# Patient Record
Sex: Female | Born: 1940
Health system: Southern US, Community
[De-identification: ages and names within clinical notes are randomized; demographics above are authoritative.]

## PROBLEM LIST (undated history)

## (undated) DIAGNOSIS — R918 Other nonspecific abnormal finding of lung field: Secondary | ICD-10-CM

## (undated) DIAGNOSIS — I9789 Other postprocedural complications and disorders of the circulatory system, not elsewhere classified: Secondary | ICD-10-CM

## (undated) DIAGNOSIS — I301 Infective pericarditis: Secondary | ICD-10-CM

## (undated) DIAGNOSIS — I447 Left bundle-branch block, unspecified: Secondary | ICD-10-CM

## (undated) DIAGNOSIS — I10 Essential (primary) hypertension: Secondary | ICD-10-CM

## (undated) DIAGNOSIS — I4891 Unspecified atrial fibrillation: Secondary | ICD-10-CM

## (undated) DIAGNOSIS — I314 Cardiac tamponade: Secondary | ICD-10-CM

## (undated) DIAGNOSIS — R911 Solitary pulmonary nodule: Secondary | ICD-10-CM

## (undated) HISTORY — DX: Left bundle-branch block, unspecified: I44.7

## (undated) HISTORY — DX: Other nonspecific abnormal finding of lung field: R91.8

## (undated) HISTORY — DX: Unspecified atrial fibrillation: I97.89

## (undated) HISTORY — PX: OTHER SURGICAL HISTORY: SHX169

## (undated) HISTORY — DX: Solitary pulmonary nodule: R91.1

## (undated) HISTORY — DX: Unspecified atrial fibrillation: I48.91

## (undated) HISTORY — PX: LUNG REMOVAL, PARTIAL: SHX233

## (undated) HISTORY — DX: Essential (primary) hypertension: I10

---

## 1986-05-18 HISTORY — PX: ABDOMINAL HYSTERECTOMY: SHX81

## 2011-09-01 DIAGNOSIS — I459 Conduction disorder, unspecified: Secondary | ICD-10-CM | POA: Diagnosis not present

## 2011-09-01 DIAGNOSIS — R599 Enlarged lymph nodes, unspecified: Secondary | ICD-10-CM | POA: Diagnosis present

## 2011-09-01 DIAGNOSIS — R079 Chest pain, unspecified: Secondary | ICD-10-CM | POA: Diagnosis not present

## 2011-09-01 DIAGNOSIS — I309 Acute pericarditis, unspecified: Secondary | ICD-10-CM | POA: Diagnosis not present

## 2011-09-01 DIAGNOSIS — I2510 Atherosclerotic heart disease of native coronary artery without angina pectoris: Secondary | ICD-10-CM | POA: Diagnosis present

## 2011-09-01 DIAGNOSIS — I10 Essential (primary) hypertension: Secondary | ICD-10-CM | POA: Diagnosis present

## 2011-09-01 DIAGNOSIS — M279 Disease of jaws, unspecified: Secondary | ICD-10-CM | POA: Diagnosis not present

## 2011-09-01 DIAGNOSIS — I369 Nonrheumatic tricuspid valve disorder, unspecified: Secondary | ICD-10-CM | POA: Diagnosis not present

## 2011-09-01 DIAGNOSIS — I447 Left bundle-branch block, unspecified: Secondary | ICD-10-CM | POA: Diagnosis present

## 2011-09-01 DIAGNOSIS — J811 Chronic pulmonary edema: Secondary | ICD-10-CM | POA: Diagnosis not present

## 2011-09-01 DIAGNOSIS — I4891 Unspecified atrial fibrillation: Secondary | ICD-10-CM | POA: Diagnosis present

## 2011-09-01 DIAGNOSIS — R7611 Nonspecific reaction to tuberculin skin test without active tuberculosis: Secondary | ICD-10-CM | POA: Diagnosis present

## 2011-09-01 DIAGNOSIS — B9789 Other viral agents as the cause of diseases classified elsewhere: Secondary | ICD-10-CM | POA: Diagnosis present

## 2011-09-01 DIAGNOSIS — R0789 Other chest pain: Secondary | ICD-10-CM | POA: Diagnosis not present

## 2011-09-01 DIAGNOSIS — I318 Other specified diseases of pericardium: Secondary | ICD-10-CM | POA: Diagnosis not present

## 2011-09-01 DIAGNOSIS — I82619 Acute embolism and thrombosis of superficial veins of unspecified upper extremity: Secondary | ICD-10-CM | POA: Diagnosis not present

## 2011-09-01 DIAGNOSIS — I491 Atrial premature depolarization: Secondary | ICD-10-CM | POA: Diagnosis not present

## 2011-09-01 DIAGNOSIS — I314 Cardiac tamponade: Secondary | ICD-10-CM | POA: Diagnosis not present

## 2011-09-01 DIAGNOSIS — I808 Phlebitis and thrombophlebitis of other sites: Secondary | ICD-10-CM | POA: Diagnosis not present

## 2011-09-01 DIAGNOSIS — I517 Cardiomegaly: Secondary | ICD-10-CM | POA: Diagnosis not present

## 2011-09-01 DIAGNOSIS — Z452 Encounter for adjustment and management of vascular access device: Secondary | ICD-10-CM | POA: Diagnosis not present

## 2011-09-01 DIAGNOSIS — I498 Other specified cardiac arrhythmias: Secondary | ICD-10-CM | POA: Diagnosis not present

## 2011-09-01 DIAGNOSIS — J9819 Other pulmonary collapse: Secondary | ICD-10-CM | POA: Diagnosis not present

## 2011-09-01 DIAGNOSIS — J90 Pleural effusion, not elsewhere classified: Secondary | ICD-10-CM | POA: Diagnosis not present

## 2011-09-01 DIAGNOSIS — R0602 Shortness of breath: Secondary | ICD-10-CM | POA: Diagnosis not present

## 2011-09-01 DIAGNOSIS — D7289 Other specified disorders of white blood cells: Secondary | ICD-10-CM | POA: Diagnosis not present

## 2011-09-01 DIAGNOSIS — I319 Disease of pericardium, unspecified: Secondary | ICD-10-CM | POA: Diagnosis not present

## 2011-09-01 DIAGNOSIS — I509 Heart failure, unspecified: Secondary | ICD-10-CM | POA: Diagnosis not present

## 2011-09-01 DIAGNOSIS — C4490 Unspecified malignant neoplasm of skin, unspecified: Secondary | ICD-10-CM | POA: Diagnosis not present

## 2011-09-01 DIAGNOSIS — Z4682 Encounter for fitting and adjustment of non-vascular catheter: Secondary | ICD-10-CM | POA: Diagnosis not present

## 2011-09-01 DIAGNOSIS — R9389 Abnormal findings on diagnostic imaging of other specified body structures: Secondary | ICD-10-CM | POA: Diagnosis not present

## 2011-09-03 HISTORY — PX: OTHER SURGICAL HISTORY: SHX169

## 2011-09-14 DIAGNOSIS — J90 Pleural effusion, not elsewhere classified: Secondary | ICD-10-CM | POA: Diagnosis not present

## 2011-09-14 DIAGNOSIS — I311 Chronic constrictive pericarditis: Secondary | ICD-10-CM | POA: Diagnosis not present

## 2011-09-14 DIAGNOSIS — I319 Disease of pericardium, unspecified: Secondary | ICD-10-CM | POA: Diagnosis not present

## 2011-09-14 DIAGNOSIS — I4891 Unspecified atrial fibrillation: Secondary | ICD-10-CM | POA: Diagnosis not present

## 2011-09-15 ENCOUNTER — Emergency Department (HOSPITAL_BASED_OUTPATIENT_CLINIC_OR_DEPARTMENT_OTHER)
Admission: EM | Admit: 2011-09-15 | Discharge: 2011-09-15 | Disposition: A | Discharge status: Home / Self Care | Payer: Medicare Other | Attending: Emergency Medicine | Admitting: Emergency Medicine

## 2011-09-15 ENCOUNTER — Encounter (HOSPITAL_BASED_OUTPATIENT_CLINIC_OR_DEPARTMENT_OTHER): Payer: Self-pay | Admitting: *Deleted

## 2011-09-15 ENCOUNTER — Emergency Department (INDEPENDENT_AMBULATORY_CARE_PROVIDER_SITE_OTHER): Payer: Medicare Other

## 2011-09-15 DIAGNOSIS — R350 Frequency of micturition: Secondary | ICD-10-CM | POA: Diagnosis not present

## 2011-09-15 DIAGNOSIS — Z79899 Other long term (current) drug therapy: Secondary | ICD-10-CM | POA: Insufficient documentation

## 2011-09-15 DIAGNOSIS — R05 Cough: Secondary | ICD-10-CM | POA: Insufficient documentation

## 2011-09-15 DIAGNOSIS — R079 Chest pain, unspecified: Secondary | ICD-10-CM | POA: Diagnosis not present

## 2011-09-15 DIAGNOSIS — R918 Other nonspecific abnormal finding of lung field: Secondary | ICD-10-CM | POA: Diagnosis not present

## 2011-09-15 DIAGNOSIS — J90 Pleural effusion, not elsewhere classified: Secondary | ICD-10-CM | POA: Diagnosis not present

## 2011-09-15 DIAGNOSIS — K5900 Constipation, unspecified: Secondary | ICD-10-CM | POA: Insufficient documentation

## 2011-09-15 DIAGNOSIS — R059 Cough, unspecified: Secondary | ICD-10-CM | POA: Diagnosis not present

## 2011-09-15 DIAGNOSIS — Z0389 Encounter for observation for other suspected diseases and conditions ruled out: Secondary | ICD-10-CM | POA: Diagnosis not present

## 2011-09-15 DIAGNOSIS — M546 Pain in thoracic spine: Secondary | ICD-10-CM | POA: Insufficient documentation

## 2011-09-15 DIAGNOSIS — M549 Dorsalgia, unspecified: Secondary | ICD-10-CM

## 2011-09-15 HISTORY — DX: Cardiac tamponade: I31.4

## 2011-09-15 HISTORY — DX: Infective pericarditis: I30.1

## 2011-09-15 LAB — URINALYSIS, ROUTINE W REFLEX MICROSCOPIC
Bilirubin Urine: NEGATIVE
Glucose, UA: NEGATIVE mg/dL
Hgb urine dipstick: NEGATIVE
Ketones, ur: NEGATIVE mg/dL
Nitrite: NEGATIVE
Protein, ur: NEGATIVE mg/dL
Specific Gravity, Urine: 1.016 (ref 1.005–1.030)
Urobilinogen, UA: 0.2 mg/dL (ref 0.0–1.0)
pH: 6.5 (ref 5.0–8.0)

## 2011-09-15 LAB — URINE MICROSCOPIC-ADD ON

## 2011-09-15 NOTE — ED Notes (Signed)
MD at bedside, ultrasound performed.

## 2011-09-15 NOTE — Discharge Instructions (Signed)
RETURN FOR WORSENED PAIN, SHORTNESS OF BREATH, WEAKNESS, DIZZINESS, IF YOU PASS OUT OR CHEST PAIN OVER THE NEXT 12-24 HOURS PLEASE FOLLOWUP AS ALREADY ARRANGED, AND BE SURE TO HAVE REPEAT CAT SCAN OF YOUR CHEST AND REPEAT ECHOCARDIOGRAM OF YOUR HEART WITHIN ONE MONTH AS MENTIONED IN YOUR PAPERWORK FROM [facility]

## 2011-09-15 NOTE — ED Notes (Signed)
Pt also requesting something to assist her with moving her bowels. States she has been constipated for a few days and has also had frequent urination.

## 2011-09-15 NOTE — ED Notes (Signed)
Pt c/o bilateral lower lobe lung pain after going to bed tonight. Pt is recovering from heart surgery which was performed in [place] for cardiac tamponade. Pt denies any CP or SOB, only c/o lung pain. Pt states that she has been using her spirometer as directed.

## 2011-09-15 NOTE — ED Provider Notes (Signed)
History     CSN: [id number]  Arrival date & time 09/15/11  0424   First MD Initiated Contact with Patient 09/15/11 0424      Chief Complaint  Patient presents with  . Cough  . Constipation    Patient is a 71 y.o. female presenting with back pain. The history is provided by the patient.  Back Pain  This is a new problem. The current episode started 1 to 2 hours ago. The problem occurs constantly. The problem has been gradually worsening. The pain is associated with no known injury. Pain location: paraspinal - thoracic. Quality: aching. The pain does not radiate. The pain is mild. The symptoms are aggravated by twisting and bending. The pain is the same all the time. Pertinent negatives include no chest pain, no abdominal pain, no dysuria, no paresis and no weakness. She has tried bed rest for the symptoms. The treatment provided no relief.  Pt reports upper back pain that occurred while sleeping tonight.  She reports it is improved with movement and also using incentive spirometer No trauma No anterior chest pain No SOB reported No syncope reported No orthopnea reported No dyspnea on exertion No fever No new LE edema No focal leg weakness reported She does report urinary frequency but no dysuria  Pt with recent admission to [facility] hospital, recently discharged Reports she has f/u arranged here locally for followup care  Past Medical History  Diagnosis Date  . Cardiac tamponade due to viral pericarditis     Past Surgical History  Procedure Date  . Chest tube placement     History reviewed. No pertinent family history.  History  Substance Use Topics  . Smoking status: Former Games developer  . Smokeless tobacco: Not on file  . Alcohol Use:     OB History    Grav Para Term Preterm Abortions TAB SAB Ect Mult Living                  Review of Systems  Cardiovascular: Negative for chest pain.  Gastrointestinal: Negative for abdominal pain.  Genitourinary:  Negative for dysuria.  Musculoskeletal: Positive for back pain.  Neurological: Negative for weakness.  All other systems reviewed and are negative.    Allergies  Review of patient's allergies indicates no known allergies.  Home Medications   Current Outpatient Rx  Name Route Sig Dispense Refill  . AMIODARONE HCL 200 MG PO TABS Oral Take 200 mg by mouth daily.    . COLCHICINE 0.6 MG PO TABS Oral Take 0.6 mg by mouth daily.    Marland Kitchen METOPROLOL SUCCINATE ER 25 MG PO TB24 Oral Take 25 mg by mouth daily.    Marland Kitchen PANTOPRAZOLE SODIUM 40 MG PO TBEC Oral Take 40 mg by mouth daily.      BP 161/77  Pulse 73  Temp(Src) 98.3 F (36.8 C) (Oral)  Resp 22  Ht 5\' 5"  (1.651 m)  Wt 220 lb (99.791 kg)  BMI 36.61 kg/m2  SpO2 98%  Physical Exam CONSTITUTIONAL: Well developed/well nourished HEAD AND FACE: Normocephalic/atraumatic EYES: EOMI/PERRL ENMT: Mucous membranes moist NECK: supple no meningeal signs SPINE:entire spine nontender, thoracic paraspinal tenderness CV: S1/S2 noted, no murmurs/rubs/gallops noted LUNGS: Lungs are clear to auscultation bilaterally, no apparent distress Chest - well healed incision noted to lower chest without crepitance/drainage/erythema noted ABDOMEN: soft, nontender, no rebound or guarding GU:no cva tenderness NEURO: Pt is awake/alert, moves all extremitiesx4 EXTREMITIES: pulses normal, full ROM.  1+ pitting edema noted to bilateral tibial  surface (reports this is improved since leaving hospital) SKIN: warm, color normal PSYCH: no abnormalities of mood noted  ED Course  Procedures     EMERGENCY DEPARTMENT [place] CARDIAC EXAM "Study: Limited Ultrasound of the heart and pericardium"  INDICATIONS:back pain, with h/o pericardial effusion Multiple views of the heart and pericardium are obtained with a multi-frequency probe.  PERFORMED WU:[id number]  IMAGES ARCHIVED?: Yes  FINDINGS: No pericardial effusion  LIMITATIONS:  Body habitus  VIEWS USED: Parasternal long  axis  INTERPRETATION: Pericardial effusioin absent     Labs Reviewed  URINALYSIS, ROUTINE W REFLEX MICROSCOPIC   5:00 AM Patient with recent admission to [facility] for pericarditis with effusion/tamponade.  She was there from 4/16-4/25.  She underwent pericardial window with pericardial resection.  She was found to have afib and is on amiodarone.  As of time of discharge no known cause for effusion.  It was recommend to have repeat formal echo and CT chest 1 month after discharge.  EF on recent echo was 55%  Pt reports that "they told me to get checked if I have pain" She only reports pain to her "lung lobes" in her back She is in no distress I will check CXR and EKG. 6:30 AM CXR reviewed - I doubt pneumonia as no fever, no cough while in room, and lung sounds clear She denies syncope/dizziness/weakness/shortness of breath, and she had no anterior chest pain.  Did not endorse a pleuritic type back pain so I doubt acute PE.  She reports urinary frequency but u/a negative.  She reports that she has had this intermittently since the hospital discharge, but no dysuria/fever/abdominal pain.   I performed limited bedside cardiac ultrasound and no convincing signs of pericardial effusion noted She had a negative pulsus paradoxus  While in the ED I doubt recurrent pericardial effusion/tamponade She has f/u arranged for tomorrow We discussed strict return instructions Pt/family agreeable  The patient appears reasonably screened and/or stabilized for discharge and I doubt any other medical condition or other [facility] requiring further screening, evaluation, or treatment in the ED at this time prior to discharge.    MDM  Nursing notes reviewed and considered in documentation xrays reviewed and considered All labs/vitals reviewed and considered Previous records reviewed and considered (from [place])   Date: 09/15/2011  Rate: 70  Rhythm: normal sinus rhythm  QRS Axis:  left  Intervals: normal  ST/T Wave abnormalities: nonspecific T wave changes  Conduction Disutrbances:left bundle branch block  Narrative Interpretation: no low voltage noted, no alternans noted  Old EKG Reviewed: none available to review but records from [facility] reveal h/o left bundle branch block           Joya Gaskins, MD 09/15/11 [phone]

## 2011-09-15 NOTE — ED Notes (Signed)
Pt had fluid removed from around heart over a week ago in . Pt c/o of having pain in both her "lower lobes" and needs her bowels to move.

## 2011-09-16 DIAGNOSIS — I319 Disease of pericardium, unspecified: Secondary | ICD-10-CM | POA: Diagnosis not present

## 2011-09-16 DIAGNOSIS — I4891 Unspecified atrial fibrillation: Secondary | ICD-10-CM | POA: Diagnosis not present

## 2011-09-16 DIAGNOSIS — Z9889 Other specified postprocedural states: Secondary | ICD-10-CM | POA: Diagnosis not present

## 2011-09-17 DIAGNOSIS — I319 Disease of pericardium, unspecified: Secondary | ICD-10-CM | POA: Diagnosis not present

## 2011-09-17 DIAGNOSIS — I4891 Unspecified atrial fibrillation: Secondary | ICD-10-CM | POA: Diagnosis not present

## 2011-09-17 DIAGNOSIS — I311 Chronic constrictive pericarditis: Secondary | ICD-10-CM | POA: Diagnosis not present

## 2011-09-17 DIAGNOSIS — J90 Pleural effusion, not elsewhere classified: Secondary | ICD-10-CM | POA: Diagnosis not present

## 2011-09-21 ENCOUNTER — Other Ambulatory Visit: Payer: Self-pay | Admitting: Cardiology

## 2011-09-21 DIAGNOSIS — Z9889 Other specified postprocedural states: Secondary | ICD-10-CM | POA: Diagnosis not present

## 2011-09-21 DIAGNOSIS — I4891 Unspecified atrial fibrillation: Secondary | ICD-10-CM | POA: Diagnosis not present

## 2011-09-21 DIAGNOSIS — I319 Disease of pericardium, unspecified: Secondary | ICD-10-CM

## 2011-09-22 DIAGNOSIS — I319 Disease of pericardium, unspecified: Secondary | ICD-10-CM | POA: Diagnosis not present

## 2011-09-22 DIAGNOSIS — J90 Pleural effusion, not elsewhere classified: Secondary | ICD-10-CM | POA: Diagnosis not present

## 2011-09-22 DIAGNOSIS — I311 Chronic constrictive pericarditis: Secondary | ICD-10-CM | POA: Diagnosis not present

## 2011-09-22 DIAGNOSIS — I4891 Unspecified atrial fibrillation: Secondary | ICD-10-CM | POA: Diagnosis not present

## 2011-09-24 DIAGNOSIS — I319 Disease of pericardium, unspecified: Secondary | ICD-10-CM | POA: Diagnosis not present

## 2011-09-24 DIAGNOSIS — I311 Chronic constrictive pericarditis: Secondary | ICD-10-CM | POA: Diagnosis not present

## 2011-09-24 DIAGNOSIS — J90 Pleural effusion, not elsewhere classified: Secondary | ICD-10-CM | POA: Diagnosis not present

## 2011-09-24 DIAGNOSIS — I4891 Unspecified atrial fibrillation: Secondary | ICD-10-CM | POA: Diagnosis not present

## 2011-09-29 ENCOUNTER — Encounter: Payer: Medicare Other | Admitting: Thoracic Surgery (Cardiothoracic Vascular Surgery)

## 2011-09-29 DIAGNOSIS — I319 Disease of pericardium, unspecified: Secondary | ICD-10-CM | POA: Diagnosis not present

## 2011-09-29 DIAGNOSIS — I311 Chronic constrictive pericarditis: Secondary | ICD-10-CM | POA: Diagnosis not present

## 2011-09-29 DIAGNOSIS — I4891 Unspecified atrial fibrillation: Secondary | ICD-10-CM | POA: Diagnosis not present

## 2011-09-29 DIAGNOSIS — J90 Pleural effusion, not elsewhere classified: Secondary | ICD-10-CM | POA: Diagnosis not present

## 2011-09-30 DIAGNOSIS — I4891 Unspecified atrial fibrillation: Secondary | ICD-10-CM | POA: Diagnosis not present

## 2011-09-30 DIAGNOSIS — I319 Disease of pericardium, unspecified: Secondary | ICD-10-CM | POA: Diagnosis not present

## 2011-09-30 DIAGNOSIS — Z9889 Other specified postprocedural states: Secondary | ICD-10-CM | POA: Diagnosis not present

## 2011-10-01 DIAGNOSIS — I311 Chronic constrictive pericarditis: Secondary | ICD-10-CM | POA: Diagnosis not present

## 2011-10-01 DIAGNOSIS — I319 Disease of pericardium, unspecified: Secondary | ICD-10-CM | POA: Diagnosis not present

## 2011-10-01 DIAGNOSIS — I4891 Unspecified atrial fibrillation: Secondary | ICD-10-CM | POA: Diagnosis not present

## 2011-10-01 DIAGNOSIS — J90 Pleural effusion, not elsewhere classified: Secondary | ICD-10-CM | POA: Diagnosis not present

## 2011-10-03 DIAGNOSIS — J90 Pleural effusion, not elsewhere classified: Secondary | ICD-10-CM | POA: Diagnosis not present

## 2011-10-03 DIAGNOSIS — I4891 Unspecified atrial fibrillation: Secondary | ICD-10-CM | POA: Diagnosis not present

## 2011-10-03 DIAGNOSIS — I319 Disease of pericardium, unspecified: Secondary | ICD-10-CM | POA: Diagnosis not present

## 2011-10-03 DIAGNOSIS — I311 Chronic constrictive pericarditis: Secondary | ICD-10-CM | POA: Diagnosis not present

## 2011-10-06 ENCOUNTER — Encounter: Payer: Self-pay | Admitting: Thoracic Surgery (Cardiothoracic Vascular Surgery)

## 2011-10-06 ENCOUNTER — Institutional Professional Consult (permissible substitution) (INDEPENDENT_AMBULATORY_CARE_PROVIDER_SITE_OTHER): Payer: Medicare Other | Admitting: Thoracic Surgery (Cardiothoracic Vascular Surgery)

## 2011-10-06 VITALS — BP 152/88 | HR 72 | Resp 18 | Ht 65.0 in | Wt 218.0 lb

## 2011-10-06 DIAGNOSIS — I311 Chronic constrictive pericarditis: Secondary | ICD-10-CM | POA: Diagnosis not present

## 2011-10-06 DIAGNOSIS — I319 Disease of pericardium, unspecified: Secondary | ICD-10-CM

## 2011-10-06 DIAGNOSIS — J90 Pleural effusion, not elsewhere classified: Secondary | ICD-10-CM | POA: Diagnosis not present

## 2011-10-06 DIAGNOSIS — I313 Pericardial effusion (noninflammatory): Secondary | ICD-10-CM | POA: Insufficient documentation

## 2011-10-06 DIAGNOSIS — I4891 Unspecified atrial fibrillation: Secondary | ICD-10-CM | POA: Diagnosis not present

## 2011-10-06 DIAGNOSIS — I3139 Other pericardial effusion (noninflammatory): Secondary | ICD-10-CM

## 2011-10-06 NOTE — Progress Notes (Signed)
PCP is Beverley Fiedler, MD, MD Referring Provider is Quintella Reichert, MD  Chief Complaint  Patient presents with  . Pericardial Effusion    Referral from Dr Mayford Knife for surgical eval on pericardial effusion, ECHO 09/30/11    HPI: 71 year old woman from [place] who had a subxiphoid pericardial window done at [facility] on April 10. She was discharged from the without a clear understanding of when she could resume activities. She was told she could stop her Motrin on May 10, but then thought she should continue to take it and has been on regular basis. She was told not to lift anything heavy or raise her arms, but was not given the day when she can begin to do those things again.  Past Medical History  Diagnosis Date  . Cardiac tamponade due to viral pericarditis   . A-fib     Past Surgical History  Procedure Date  . Chest tube placement 09/03/11  . Subxiphoid pericardial window and drainage anterior partial pericardiectomy 09/03/11  . Right knee arthoscopy with meniscal repair     Family History  Problem Relation Age of Onset  . Hypertension Mother   . Ovarian cancer Mother   . Hypertension Father   . Stroke Father   . Prostate cancer Brother   . Thyroid disease Sister   . Hypertension Sister     Social History History  Substance Use Topics  . Smoking status: Former Smoker    Types: Cigarettes    Quit date: 05/18/1978  . Smokeless tobacco: Never Used  . Alcohol Use: No    Current Outpatient Prescriptions  Medication Sig Dispense Refill  . amiodarone (PACERONE) 200 MG tablet Take 200 mg by mouth 2 (two) times daily.       . colchicine 0.6 MG tablet Take 0.6 mg by mouth 2 (two) times daily.       . metoprolol succinate (TOPROL-XL) 25 MG 24 hr tablet Take 25 mg by mouth 2 (two) times daily.       . pantoprazole (PROTONIX) 40 MG tablet Take 40 mg by mouth daily.        Allergies  Allergen Reactions  . Tape Hives    Review of Systems  Constitutional: Positive  for fatigue. Negative for fever and chills.  Respiratory: Positive for chest tightness and shortness of breath (with exertion).   Cardiovascular: Positive for chest pain and palpitations.       Heart murmur Atrial fibrillation  All other systems reviewed and are negative.    BP 152/88  Pulse 72  Resp 18  Ht 5\' 5"  (1.651 m)  Wt 218 lb (98.884 kg)  BMI 36.28 kg/m2  SpO2 98% Physical Exam  Constitutional: She is oriented to person, place, and time. She appears well-developed and well-nourished. No distress.  HENT:  Head: Normocephalic and atraumatic.  Cardiovascular: Normal rate and regular rhythm.   Murmur heard. Pulmonary/Chest: Effort normal and breath sounds normal. No respiratory distress. She has no wheezes. She has no rales.  Abdominal:       Incision clean, dry and intact  Musculoskeletal: She exhibits no edema.  Neurological: She is alert and oriented to person, place, and time.  Skin: Skin is warm and dry.     Diagnostic Tests: Echocardiogram 09/30/2011-  report states normal LV size and function, LVEF 55-60%, trivial posterior pericardial effusion.  CXR 09/15/11 CHEST - 2 VIEW  Comparison: None.  Findings: Heart size upper normal to mildly enlarged. The  pericardial effusion not excluded. Small  pleural effusions with  associated opacities; atelectasis versus infiltrate. No acute  osseous abnormality. No pneumothorax identified.  IMPRESSION:  Prominent cardiac contour. Pericardial effusion not excluded.  Bibasilar opacities and associated small pleural effusions;  atelectasis versus infiltrate.  Impression: 71 year old woman with recent subxiphoid pericardial window for probable viral pericarditis. She was treated with anti-inflammatory medications. Repeat echo shows a trivial effusion. Chest x-ray shows very small bilateral pleural effusions.  Her wound is healing well. I recommend that she not lift any objects greater than 10 pounds until it has been 6 weeks  from her surgery. Likewise she should avoid extending our raising her arms for the same period of time. At this point she is safe to ride in the front seat.  I told her she can use Motrin as needed for pain but does not need to take down a regular basis. She should  complete her prescribed course of colchicine.  I gave her a card with our numbers so that she can contact our office with any issues.  Plan: I will be happy to see her back if I can be of any further assistance with her care

## 2011-10-07 DIAGNOSIS — J90 Pleural effusion, not elsewhere classified: Secondary | ICD-10-CM | POA: Diagnosis not present

## 2011-10-07 DIAGNOSIS — I4891 Unspecified atrial fibrillation: Secondary | ICD-10-CM | POA: Diagnosis not present

## 2011-10-07 DIAGNOSIS — I319 Disease of pericardium, unspecified: Secondary | ICD-10-CM | POA: Diagnosis not present

## 2011-10-07 DIAGNOSIS — I311 Chronic constrictive pericarditis: Secondary | ICD-10-CM | POA: Diagnosis not present

## 2011-10-09 DIAGNOSIS — I4891 Unspecified atrial fibrillation: Secondary | ICD-10-CM | POA: Diagnosis not present

## 2011-10-09 DIAGNOSIS — J90 Pleural effusion, not elsewhere classified: Secondary | ICD-10-CM | POA: Diagnosis not present

## 2011-10-09 DIAGNOSIS — I319 Disease of pericardium, unspecified: Secondary | ICD-10-CM | POA: Diagnosis not present

## 2011-10-09 DIAGNOSIS — I311 Chronic constrictive pericarditis: Secondary | ICD-10-CM | POA: Diagnosis not present

## 2011-10-12 DIAGNOSIS — J90 Pleural effusion, not elsewhere classified: Secondary | ICD-10-CM | POA: Diagnosis not present

## 2011-10-12 DIAGNOSIS — I319 Disease of pericardium, unspecified: Secondary | ICD-10-CM | POA: Diagnosis not present

## 2011-10-12 DIAGNOSIS — I4891 Unspecified atrial fibrillation: Secondary | ICD-10-CM | POA: Diagnosis not present

## 2011-10-12 DIAGNOSIS — I311 Chronic constrictive pericarditis: Secondary | ICD-10-CM | POA: Diagnosis not present

## 2011-10-15 DIAGNOSIS — I4891 Unspecified atrial fibrillation: Secondary | ICD-10-CM | POA: Diagnosis not present

## 2011-10-15 DIAGNOSIS — I311 Chronic constrictive pericarditis: Secondary | ICD-10-CM | POA: Diagnosis not present

## 2011-10-15 DIAGNOSIS — J90 Pleural effusion, not elsewhere classified: Secondary | ICD-10-CM | POA: Diagnosis not present

## 2011-10-15 DIAGNOSIS — I319 Disease of pericardium, unspecified: Secondary | ICD-10-CM | POA: Diagnosis not present

## 2011-10-16 DIAGNOSIS — J90 Pleural effusion, not elsewhere classified: Secondary | ICD-10-CM | POA: Diagnosis not present

## 2011-10-16 DIAGNOSIS — I4891 Unspecified atrial fibrillation: Secondary | ICD-10-CM | POA: Diagnosis not present

## 2011-10-16 DIAGNOSIS — I319 Disease of pericardium, unspecified: Secondary | ICD-10-CM | POA: Diagnosis not present

## 2011-10-16 DIAGNOSIS — I311 Chronic constrictive pericarditis: Secondary | ICD-10-CM | POA: Diagnosis not present

## 2011-11-04 ENCOUNTER — Ambulatory Visit
Admission: RE | Admit: 2011-11-04 | Discharge: 2011-11-04 | Disposition: A | Discharge status: Home / Self Care | Payer: Medicare Other | Source: Ambulatory Visit | Attending: Cardiology | Admitting: Cardiology

## 2011-11-04 DIAGNOSIS — R918 Other nonspecific abnormal finding of lung field: Secondary | ICD-10-CM | POA: Diagnosis not present

## 2011-11-04 DIAGNOSIS — I319 Disease of pericardium, unspecified: Secondary | ICD-10-CM

## 2011-11-04 MED ORDER — IOHEXOL 300 MG/ML  SOLN
75.0000 mL | Freq: Once | INTRAMUSCULAR | Status: AC | PRN
  Administered 2011-11-04: 75 mL via INTRAVENOUS

## 2011-11-13 ENCOUNTER — Emergency Department (HOSPITAL_BASED_OUTPATIENT_CLINIC_OR_DEPARTMENT_OTHER)
Admission: EM | Admit: 2011-11-13 | Discharge: 2011-11-13 | Disposition: A | Discharge status: Home / Self Care | Payer: Medicare Other | Attending: Emergency Medicine | Admitting: Emergency Medicine

## 2011-11-13 ENCOUNTER — Encounter (HOSPITAL_BASED_OUTPATIENT_CLINIC_OR_DEPARTMENT_OTHER): Payer: Self-pay

## 2011-11-13 DIAGNOSIS — R209 Unspecified disturbances of skin sensation: Secondary | ICD-10-CM | POA: Insufficient documentation

## 2011-11-13 DIAGNOSIS — Z87891 Personal history of nicotine dependence: Secondary | ICD-10-CM | POA: Diagnosis not present

## 2011-11-13 DIAGNOSIS — I4891 Unspecified atrial fibrillation: Secondary | ICD-10-CM | POA: Insufficient documentation

## 2011-11-13 DIAGNOSIS — R202 Paresthesia of skin: Secondary | ICD-10-CM

## 2011-11-13 DIAGNOSIS — M79609 Pain in unspecified limb: Secondary | ICD-10-CM | POA: Diagnosis not present

## 2011-11-13 NOTE — ED Provider Notes (Signed)
History     CSN: [id number]  Arrival date & time 11/13/11  2218   First MD Initiated Contact with Patient 11/13/11 2236      Chief Complaint  Patient presents with  . Numbness    (Consider location/radiation/quality/duration/timing/severity/associated sxs/prior treatment) HPI This 71 year old black female who had the sudden onset of a sharp pain in her right forearm about an hour ago. The pain was located along the ulnar side of the forearm. The pain only lasted a couple of minutes and this was followed by a sensation of numbness in the entire right forearm and hand. Although she was experiencing this subjective numbness she still had normal sensation in that forearm when she touched it. She states the forearm also appeared to be darker colored than normal. The symptoms have nearly resolved now without specific treatment other than flexing and extending her fingers. There was no associated motor deficit. There were no paresthesias of the face or other parts of the body. She has had no recent injury to her arm.   Past Medical History  Diagnosis Date  . Cardiac tamponade due to viral pericarditis   . A-fib     Past Surgical History  Procedure Date  . Chest tube placement 09/03/11  . Subxiphoid pericardial window and drainage anterior partial pericardiectomy 09/03/11  . Right knee arthoscopy with meniscal repair   . Abdominal hysterectomy     Family History  Problem Relation Age of Onset  . Hypertension Mother   . Ovarian cancer Mother   . Hypertension Father   . Stroke Father   . Prostate cancer Brother   . Thyroid disease Sister   . Hypertension Sister     History  Substance Use Topics  . Smoking status: Former Smoker    Types: Cigarettes    Quit date: 05/18/1978  . Smokeless tobacco: Never Used  . Alcohol Use: No    OB History    Grav Para Term Preterm Abortions TAB SAB Ect Mult Living                  Review of Systems  All other systems reviewed and are  negative.    Allergies  Tape  Home Medications   Current Outpatient Rx  Name Route Sig Dispense Refill  . AMIODARONE HCL 200 MG PO TABS Oral Take 200 mg by mouth 2 (two) times daily.     . COLCHICINE 0.6 MG PO TABS Oral Take 0.6 mg by mouth 2 (two) times daily.     Marland Kitchen METOPROLOL SUCCINATE ER 25 MG PO TB24 Oral Take 25 mg by mouth 2 (two) times daily.     Marland Kitchen PANTOPRAZOLE SODIUM 40 MG PO TBEC Oral Take 40 mg by mouth daily.      BP 158/50  Pulse 65  Temp 97.6 F (36.4 C) (Oral)  Resp 16  Ht 5\' 6"  (1.676 m)  Wt 210 lb (95.255 kg)  BMI 33.89 kg/m2  SpO2 99%  Physical Exam General: Well-developed, well-nourished female in no acute distress; appearance consistent with age of record HENT: normocephalic, atraumatic Eyes: pupils equal round and reactive to light; extraocular muscles intact; arcus senilis bilaterally Neck: supple; no carotid bruit Heart: regular rate and rhythm Lungs: clear to auscultation bilaterally Abdomen: soft; nondistended Extremities: No deformity; full range of motion; pulses normal; normal capillary refill of fingers bilaterally; normal color of forearms and hands bilaterally Neurologic: Awake, alert and oriented; motor function intact in all extremities and symmetric; no facial droop; normal sensation  of forearms and hands bilaterally Skin: Warm and dry Psychiatric: Normal mood and affect    ED Course  Procedures (including critical care time)     MDM  EKG Interpretation:  Date & Time: 11/13/2011 11:07 PM  Rate: 65  Rhythm: normal sinus rhythm  QRS Axis: normal  Intervals: PR prolonged  ST/T Wave abnormalities: normal  Conduction Disutrbances:first-degree A-V block  and left bundle branch block  Narrative Interpretation: left atrial enlargement  Old EKG Reviewed: unchanged  The patient's symptoms are not consistent with a TIA as a TIA would not be expected to cause pain. Also the patient's sensation remained intact despite her subjective  paresthesias. The patient did have PICC line and cellulitis in her right arm in April of this year. She's concerned about possible blood clot. We will have her return for a Doppler ultrasound tomorrow morning.         Hanley Seamen, MD 11/13/11 [phone]

## 2011-11-13 NOTE — ED Notes (Signed)
C/o numbness to right arm from elbow to hand x 30-40 min-reports "color is darker that the other"-pain earlier to right forearm-denies at present-denies injury

## 2011-11-14 ENCOUNTER — Emergency Department (HOSPITAL_BASED_OUTPATIENT_CLINIC_OR_DEPARTMENT_OTHER): Payer: Medicare Other

## 2011-11-14 ENCOUNTER — Ambulatory Visit (HOSPITAL_BASED_OUTPATIENT_CLINIC_OR_DEPARTMENT_OTHER)
Admission: RE | Admit: 2011-11-14 | Discharge: 2011-11-14 | Disposition: A | Discharge status: Home / Self Care | Payer: Medicare Other | Source: Ambulatory Visit | Attending: Emergency Medicine | Admitting: Emergency Medicine

## 2011-11-14 DIAGNOSIS — M799 Soft tissue disorder, unspecified: Secondary | ICD-10-CM | POA: Insufficient documentation

## 2011-11-14 DIAGNOSIS — M79609 Pain in unspecified limb: Secondary | ICD-10-CM | POA: Insufficient documentation

## 2011-11-14 DIAGNOSIS — I319 Disease of pericardium, unspecified: Secondary | ICD-10-CM | POA: Diagnosis not present

## 2011-11-14 DIAGNOSIS — R209 Unspecified disturbances of skin sensation: Secondary | ICD-10-CM | POA: Diagnosis not present

## 2011-11-14 NOTE — ED Provider Notes (Signed)
Ultrasound report of rue reviewed and negative for clot.  Patient called and informed of results.   Hilario Quarry, MD 11/14/11 2140

## 2011-11-15 ENCOUNTER — Inpatient Hospital Stay (HOSPITAL_BASED_OUTPATIENT_CLINIC_OR_DEPARTMENT_OTHER): Admission: RE | Admit: 2011-11-15 | Payer: Medicare Other | Source: Ambulatory Visit

## 2011-11-26 DIAGNOSIS — I319 Disease of pericardium, unspecified: Secondary | ICD-10-CM | POA: Diagnosis not present

## 2011-11-26 DIAGNOSIS — I4891 Unspecified atrial fibrillation: Secondary | ICD-10-CM | POA: Diagnosis not present

## 2011-12-02 DIAGNOSIS — I319 Disease of pericardium, unspecified: Secondary | ICD-10-CM | POA: Diagnosis not present

## 2011-12-02 DIAGNOSIS — I4891 Unspecified atrial fibrillation: Secondary | ICD-10-CM | POA: Diagnosis not present

## 2012-03-03 ENCOUNTER — Encounter (HOSPITAL_BASED_OUTPATIENT_CLINIC_OR_DEPARTMENT_OTHER): Payer: Self-pay

## 2012-03-03 ENCOUNTER — Emergency Department (HOSPITAL_BASED_OUTPATIENT_CLINIC_OR_DEPARTMENT_OTHER)
Admission: EM | Admit: 2012-03-03 | Discharge: 2012-03-03 | Disposition: A | Discharge status: Home / Self Care | Payer: Medicare Other | Attending: Emergency Medicine | Admitting: Emergency Medicine

## 2012-03-03 ENCOUNTER — Emergency Department (HOSPITAL_BASED_OUTPATIENT_CLINIC_OR_DEPARTMENT_OTHER): Payer: Medicare Other

## 2012-03-03 DIAGNOSIS — R0602 Shortness of breath: Secondary | ICD-10-CM | POA: Diagnosis not present

## 2012-03-03 DIAGNOSIS — J984 Other disorders of lung: Secondary | ICD-10-CM | POA: Diagnosis not present

## 2012-03-03 DIAGNOSIS — I319 Disease of pericardium, unspecified: Secondary | ICD-10-CM | POA: Diagnosis not present

## 2012-03-03 DIAGNOSIS — R911 Solitary pulmonary nodule: Secondary | ICD-10-CM | POA: Diagnosis not present

## 2012-03-03 DIAGNOSIS — I4891 Unspecified atrial fibrillation: Secondary | ICD-10-CM | POA: Diagnosis not present

## 2012-03-03 LAB — COMPREHENSIVE METABOLIC PANEL
ALT: 9 U/L (ref 0–35)
AST: 16 U/L (ref 0–37)
Albumin: 3.7 g/dL (ref 3.5–5.2)
Alkaline Phosphatase: 99 U/L (ref 39–117)
BUN: 20 mg/dL (ref 6–23)
CO2: 29 mEq/L (ref 19–32)
Calcium: 9.7 mg/dL (ref 8.4–10.5)
Chloride: 97 mEq/L (ref 96–112)
Creatinine, Ser: 1.1 mg/dL (ref 0.50–1.10)
GFR calc Af Amer: 57 mL/min — ABNORMAL LOW (ref >90)
GFR calc non Af Amer: 49 mL/min — ABNORMAL LOW (ref >90)
Glucose, Bld: 127 mg/dL — ABNORMAL HIGH (ref 70–99)
Potassium: 3.7 mEq/L (ref 3.5–5.1)
Sodium: 137 mEq/L (ref 135–145)
Total Bilirubin: 0.3 mg/dL (ref 0.3–1.2)
Total Protein: 7.9 g/dL (ref 6.0–8.3)

## 2012-03-03 LAB — CBC WITH DIFFERENTIAL/PLATELET
Basophils Absolute: 0.1 10*3/uL (ref 0.0–0.1)
Basophils Relative: 1 % (ref 0–1)
Eosinophils Absolute: 0.3 10*3/uL (ref 0.0–0.7)
Eosinophils Relative: 3 % (ref 0–5)
HCT: 42.6 % (ref 36.0–46.0)
Hemoglobin: 14.6 g/dL (ref 12.0–15.0)
Lymphocytes Relative: 27 % (ref 12–46)
Lymphs Abs: 3 10*3/uL (ref 0.7–4.0)
MCH: 28.6 pg (ref 26.0–34.0)
MCHC: 34.3 g/dL (ref 30.0–36.0)
MCV: 83.4 fL (ref 78.0–100.0)
Monocytes Absolute: 1 10*3/uL (ref 0.1–1.0)
Monocytes Relative: 9 % (ref 3–12)
Neutro Abs: 6.9 10*3/uL (ref 1.7–7.7)
Neutrophils Relative %: 62 % (ref 43–77)
Platelets: 177 10*3/uL (ref 150–400)
RBC: 5.11 MIL/uL (ref 3.87–5.11)
RDW: 14.7 % (ref 11.5–15.5)
WBC: 11.3 10*3/uL — ABNORMAL HIGH (ref 4.0–10.5)

## 2012-03-03 LAB — D-DIMER, QUANTITATIVE: D-Dimer, Quant: 0.97 ug/mL-FEU — ABNORMAL HIGH (ref 0.00–0.48)

## 2012-03-03 MED ORDER — IOHEXOL 350 MG/ML SOLN
100.0000 mL | Freq: Once | INTRAVENOUS | Status: AC | PRN
  Administered 2012-03-03: 100 mL via INTRAVENOUS

## 2012-03-03 NOTE — ED Notes (Signed)
Received call from Dr. Mayford Knife. Pt being sent to ED for CT r/o PE. Pt has not had kidney function tested since June. Call Dr. Mayford Knife at [phone] with results.

## 2012-03-03 NOTE — ED Provider Notes (Signed)
History     CSN: [id number]  Arrival date & time 03/03/12  4098   First MD Initiated Contact with Patient 03/03/12 2057      Chief Complaint  Patient presents with  . Abnormal Lab    (Consider location/radiation/quality/duration/timing/severity/associated sxs/prior treatment) HPI Comments: Pt was sent over from Dr Lubertha Basque office with [facility] cardiology for shortness of breath and an elevated d-dimer.  She has been having some increased SOB over the last couple of days.  No CP, no cough or chest congestion.  No increase in pedal edema.  No orthopnea.  Has a hx of cardiac tamponade in the past.  Went to see Dr Mayford Knife today who did an ECHO, which per pt did not show any increased pericardial fluid.  Had labs showing in increased d-dimer and was sent over here for evaluation.   Past Medical History  Diagnosis Date  . Cardiac tamponade due to viral pericarditis   . A-fib     Past Surgical History  Procedure Date  . Chest tube placement 09/03/11  . Subxiphoid pericardial window and drainage anterior partial pericardiectomy 09/03/11  . Right knee arthoscopy with meniscal repair   . Abdominal hysterectomy     Family History  Problem Relation Age of Onset  . Hypertension Mother   . Ovarian cancer Mother   . Hypertension Father   . Stroke Father   . Prostate cancer Brother   . Thyroid disease Sister   . Hypertension Sister     History  Substance Use Topics  . Smoking status: Former Smoker    Types: Cigarettes    Quit date: 05/18/1978  . Smokeless tobacco: Never Used  . Alcohol Use: No    OB History    Grav Para Term Preterm Abortions TAB SAB Ect Mult Living                  Review of Systems  Constitutional: Negative for fever, chills, diaphoresis and fatigue.  HENT: Negative for congestion, rhinorrhea and sneezing.   Eyes: Negative.   Respiratory: Positive for shortness of breath. Negative for cough and chest tightness.   Cardiovascular: Negative for chest pain and  leg swelling.  Gastrointestinal: Negative for nausea, vomiting, abdominal pain, diarrhea and blood in stool.  Genitourinary: Negative for frequency, hematuria, flank pain and difficulty urinating.  Musculoskeletal: Negative for back pain and arthralgias.  Skin: Negative for rash.  Neurological: Negative for dizziness, speech difficulty, weakness, numbness and headaches.    Allergies  Tape  Home Medications   Current Outpatient Rx  Name Route Sig Dispense Refill  . AMIODARONE HCL 200 MG PO TABS Oral Take 200 mg by mouth 2 (two) times daily.     . COLCHICINE 0.6 MG PO TABS Oral Take 0.6 mg by mouth 2 (two) times daily.     Marland Kitchen METOPROLOL SUCCINATE ER 25 MG PO TB24 Oral Take 25 mg by mouth 2 (two) times daily. Patient took this medication at 6 pm tonight.      BP 139/79  Pulse 69  Temp 97.4 F (36.3 C) (Oral)  SpO2 100%  Physical Exam  Constitutional: She is oriented to person, place, and time. She appears well-developed and well-nourished.  HENT:  Head: Normocephalic and atraumatic.  Eyes: Pupils are equal, round, and reactive to light.  Neck: Normal range of motion. Neck supple.  Cardiovascular: Normal rate, regular rhythm and normal heart sounds.   Pulmonary/Chest: Effort normal and breath sounds normal. No respiratory distress. She has no wheezes. She  has no rales. She exhibits no tenderness.  Abdominal: Soft. Bowel sounds are normal. There is no tenderness. There is no rebound and no guarding.  Musculoskeletal: Normal range of motion. She exhibits no edema.  Lymphadenopathy:    She has no cervical adenopathy.  Neurological: She is alert and oriented to person, place, and time.  Skin: Skin is warm and dry. No rash noted.  Psychiatric: She has a normal mood and affect.    ED Course  Procedures (including critical care time)  Results for orders placed during the hospital encounter of 03/03/12  CBC WITH DIFFERENTIAL      Component Value Range   WBC 11.3 (*) 4.0 - 10.5  K/uL   RBC 5.11  3.87 - 5.11 MIL/uL   Hemoglobin 14.6  12.0 - 15.0 g/dL   HCT 16.1  09.6 - 04.5 %   MCV 83.4  78.0 - 100.0 fL   MCH 28.6  26.0 - 34.0 pg   MCHC 34.3  30.0 - 36.0 g/dL   RDW 40.9  81.1 - 91.4 %   Platelets 177  150 - 400 K/uL   Neutrophils Relative 62  43 - 77 %   Neutro Abs 6.9  1.7 - 7.7 K/uL   Lymphocytes Relative 27  12 - 46 %   Lymphs Abs 3.0  0.7 - 4.0 K/uL   Monocytes Relative 9  3 - 12 %   Monocytes Absolute 1.0  0.1 - 1.0 K/uL   Eosinophils Relative 3  0 - 5 %   Eosinophils Absolute 0.3  0.0 - 0.7 K/uL   Basophils Relative 1  0 - 1 %   Basophils Absolute 0.1  0.0 - 0.1 K/uL  COMPREHENSIVE METABOLIC PANEL      Component Value Range   Sodium 137  135 - 145 mEq/L   Potassium 3.7  3.5 - 5.1 mEq/L   Chloride 97  96 - 112 mEq/L   CO2 29  19 - 32 mEq/L   Glucose, Bld 127 (*) 70 - 99 mg/dL   BUN 20  6 - 23 mg/dL   Creatinine, Ser 7.82  0.50 - 1.10 mg/dL   Calcium 9.7  8.4 - 95.6 mg/dL   Total Protein 7.9  6.0 - 8.3 g/dL   Albumin 3.7  3.5 - 5.2 g/dL   AST 16  0 - 37 U/L   ALT 9  0 - 35 U/L   Alkaline Phosphatase 99  39 - 117 U/L   Total Bilirubin 0.3  0.3 - 1.2 mg/dL   GFR calc non Af Amer 49 (*) >90 mL/min   GFR calc Af Amer 57 (*) >90 mL/min  D-DIMER, QUANTITATIVE      Component Value Range   D-Dimer, Quant 0.97 (*) 0.00 - 0.48 ug/mL-FEU   Ct Angio Chest Pe W/cm &/or Wo Cm  03/03/2012  *RADIOLOGY REPORT*  Clinical Data: Shortness of breath.  Elevated D-dimer.  CT ANGIOGRAPHY CHEST  Technique:  Multidetector CT imaging of the chest using the standard protocol during bolus administration of intravenous contrast. Multiplanar reconstructed images including MIPs were obtained and reviewed to evaluate the vascular anatomy.  Contrast: OMNIPAQUE IOHEXOL 350 MG/ML SOLN  Comparison: 11/04/2011  Findings: Satisfactory opacification of the pulmonary arteries noted, and there is no evidence of pulmonary emboli. No evidence of thoracic aortic aneurysm or  dissection.  No evidence of mediastinal hematoma or mass.  Shotty mediastinal lymph nodes are again seen, none of which are pathologically enlarged.  No evidence  of pleural or pericardial effusion.  Mild pleural - parenchymal scarring is seen bilaterally.  No evidence of acute infiltrate.  A 7 mm pulmonary nodule is seen in the right middle lobe, which was not definitely seen on previous study but may have been due to spatial misregistration given prior routine chest CT technique.  No other suspicious pulmonary nodules or masses are identified.  IMPRESSION:  1.  No evidence of pulmonary embolism or other acute findings. 2.  7 mm indeterminate right middle lobe pulmonary nodule.  If the patient is at high risk for bronchogenic carcinoma, follow-up chest CT at 3-6 months is recommended.  If the patient is at low risk for bronchogenic carcinoma, follow-up chest CT at 6-12 months is recommended.  This recommendation follows the consensus statement: Guidelines for Management of Small Pulmonary Nodules Detected on CT Scans: A Statement from the Fleischner Society as published in Radiology 2005; 237:395-400.   Original Report Authenticated By: Danae Orleans, M.D.     Date: 03/03/2012  Rate: 68  Rhythm: normal sinus rhythm  QRS Axis: left  Intervals: normal  ST/T Wave abnormalities: nonspecific ST/T changes  Conduction Disutrbances:left bundle branch block  Narrative Interpretation:   Old EKG Reviewed: changes noted, some increased widening of QRS     1. Shortness of breath   2. Pulmonary nodule       MDM  Discussed findings with Dr. Mayford Knife who advised pt ok for discharge.  Will f/u on pulmonary nodule.        Rolan Bucco, MD 03/03/12 2200

## 2012-03-03 NOTE — ED Notes (Signed)
Pt reports Cardio Turner advised pt to come to ED r/t elevated ddimer-pt reportsintermittent SOB x 3 weeks-denies pain

## 2012-03-08 ENCOUNTER — Encounter: Payer: Self-pay | Admitting: Critical Care Medicine

## 2012-03-09 ENCOUNTER — Other Ambulatory Visit: Payer: Medicare Other

## 2012-03-09 ENCOUNTER — Encounter: Payer: Self-pay | Admitting: Critical Care Medicine

## 2012-03-09 ENCOUNTER — Ambulatory Visit (INDEPENDENT_AMBULATORY_CARE_PROVIDER_SITE_OTHER): Payer: Medicare Other | Admitting: Critical Care Medicine

## 2012-03-09 VITALS — BP 124/82 | HR 66 | Temp 97.9°F | Ht 66.5 in | Wt 228.2 lb

## 2012-03-09 DIAGNOSIS — I313 Pericardial effusion (noninflammatory): Secondary | ICD-10-CM

## 2012-03-09 DIAGNOSIS — I319 Disease of pericardium, unspecified: Secondary | ICD-10-CM | POA: Diagnosis not present

## 2012-03-09 DIAGNOSIS — R911 Solitary pulmonary nodule: Secondary | ICD-10-CM

## 2012-03-09 LAB — ANGIOTENSIN CONVERTING ENZYME: Angiotensin-Converting Enzyme: 17 U/L (ref 8–52)

## 2012-03-09 NOTE — Progress Notes (Signed)
Subjective:    Patient ID: Barbara Nelson, female    DOB: [birth year], 71 y.o.   MRN: [id number]  HPI 71 y.o.AAF referred for abn CT /pulm nodules. 10/2011 (RLL 6mm)  and 10/13 CTs (RML 7mm, RLL nodule is gone)   and one in 4/13 in [place] (RLL nodule).  Pt not having symptoms other than dyspnea.  Prior hx of cardiac tamponade in 4/13 at [facility]. No etiology for pericardial effusion discerned.  Pt had an URI in [place].  Pt then developed symptoms. Was in [place] with another daughter,  Pt went to [employer].   Pt had pericardial window.   Surg path ???  Now no cough, no chest pain, fever, chills, sweats, wheezing, no edema in legs.  Weight is stable. No heart burn or indigestion.  Afib d/t pericarditis and now on metoprolol.  Not sustained in Afib. PPD converted to positive in 80s.  CXR ok . 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      Past Medical History  Diagnosis Date  . Cardiac tamponade due to viral pericarditis   . A-fib   . Pulmonary nodule      Family History  Problem Relation Age of Onset  . Hypertension Mother   . Ovarian cancer Mother   . Hypertension Father   . Stroke Father   . Prostate cancer Brother   . Thyroid disease Sister   . Hypertension Sister   . Leukemia Maternal Aunt      History   Social History  . Marital Status: Single    Spouse Name: N/A    Number of Children: 4  . Years of Education: N/A   Occupational History  . unemployed Charity fundraiser retired    Social History Main Topics  . Smoking status: Former Smoker -- 1.5 packs/day for 10 years    Types: Cigarettes    Quit date: 05/19/1979  . Smokeless tobacco: Never Used  . Alcohol Use: No  . Drug Use: No  . Sexually Active: Not on file   Other Topics Concern  . Not on file   Social History Narrative  . No narrative on file     Allergies  Allergen Reactions  . Tape Hives     Outpatient Prescriptions Prior to Visit  Medication Sig Dispense Refill  . metoprolol succinate  (TOPROL-XL) 25 MG 24 hr tablet Take 25 mg by mouth 2 (two) times daily.       Marland Kitchen amiodarone (PACERONE) 200 MG tablet Take 200 mg by mouth 2 (two) times daily.       . colchicine 0.6 MG tablet Take 0.6 mg by mouth 2 (two) times daily.            Review of Systems  Constitutional: Negative for fever, chills, diaphoresis, activity change, appetite change, fatigue and unexpected weight change.  HENT: Negative for hearing loss, ear pain, nosebleeds, congestion, sore throat, facial swelling, rhinorrhea, sneezing, mouth sores, trouble swallowing, neck pain, neck stiffness, dental problem, voice change, postnasal drip, sinus pressure, tinnitus and ear discharge.   Eyes: Negative for photophobia, discharge, itching and visual disturbance.  Respiratory: Positive for shortness of breath. Negative for apnea, cough, choking, chest tightness, wheezing and stridor.   Cardiovascular: Negative for chest pain, palpitations and leg swelling.  Gastrointestinal: Negative for nausea, vomiting, abdominal pain, constipation, blood in stool and abdominal distention.  Genitourinary: Negative for dysuria, urgency, frequency, hematuria, flank pain, decreased urine volume and difficulty urinating.  Musculoskeletal:  Negative for myalgias, back pain, joint swelling, arthralgias and gait problem.  Skin: Negative for color change, pallor and rash.  Neurological: Positive for dizziness. Negative for tremors, seizures, syncope, speech difficulty, weakness, light-headedness, numbness and headaches.  Hematological: Negative for adenopathy. Does not bruise/bleed easily.  Psychiatric/Behavioral: Negative for confusion, disturbed wake/sleep cycle and agitation. The patient is not nervous/anxious.        Objective:   Physical Exam Filed Vitals:   03/09/12 1029  BP: 124/82  Pulse: 66  Temp: 97.9 F (36.6 C)  TempSrc: Oral  Height: 5' 6.5" (1.689 m)  Weight: 228 lb 3.2 oz (103.511 kg)  SpO2: 97%    Gen: Pleasant,  well-nourished, in no distress,  normal affect  ENT: No lesions,  mouth clear,  oropharynx clear, no postnasal drip  Neck: No JVD, no TMG, no carotid bruits  Lungs: No use of accessory muscles, no dullness to percussion, clear   Cardiovascular: RRR, heart sounds normal, no murmur or gallops, no peripheral edema  Abdomen: soft and NT, no HSM,  BS normal  Musculoskeletal: No deformities, no cyanosis or clubbing  Neuro: alert, non focal  Skin: Warm, no lesions or rashes   CT Chest 10/2011 and 10/13 reviewed        Assessment & Plan:   Pulmonary nodule, right RML lung nodule seen June and now .  Likely benign. No indication for biopsy.  Likely unrelated to prior tamponade/pericarditis flare Plan Repeat CT Chest 05/2012 Send fungal serology and Sarcoid ACE level  Pericardial effusion/ pericarditis Hx of pericarditis with tamponade s/p pericardial window  Biopsy shows benign inflammation changes No recurrence Plan F/u per cardiology     Updated Medication List Outpatient Encounter Prescriptions as of 03/09/2012  Medication Sig Dispense Refill  . metoprolol succinate (TOPROL-XL) 25 MG 24 hr tablet Take 25 mg by mouth 2 (two) times daily.       Marland Kitchen DISCONTD: amiodarone (PACERONE) 200 MG tablet Take 200 mg by mouth 2 (two) times daily.       Marland Kitchen DISCONTD: colchicine 0.6 MG tablet Take 0.6 mg by mouth 2 (two) times daily.

## 2012-03-09 NOTE — Assessment & Plan Note (Signed)
RML lung nodule seen June and now .  Likely benign. No indication for biopsy.  Likely unrelated to prior tamponade/pericarditis flare Plan Repeat CT Chest 05/2012 Send fungal serology and Sarcoid ACE level

## 2012-03-09 NOTE — Assessment & Plan Note (Signed)
Hx of pericarditis with tamponade s/p pericardial window  Biopsy shows benign inflammation changes No recurrence Plan F/u per cardiology

## 2012-03-09 NOTE — Patient Instructions (Addendum)
Labs today CT Scan repeat 05/2012 Records from [facility] will be obtained Return 05/2012 after  Next CT Scan is done

## 2012-03-11 DIAGNOSIS — R0602 Shortness of breath: Secondary | ICD-10-CM | POA: Diagnosis not present

## 2012-03-11 DIAGNOSIS — I319 Disease of pericardium, unspecified: Secondary | ICD-10-CM | POA: Diagnosis not present

## 2012-03-11 DIAGNOSIS — I4891 Unspecified atrial fibrillation: Secondary | ICD-10-CM | POA: Diagnosis not present

## 2012-03-15 DIAGNOSIS — R0602 Shortness of breath: Secondary | ICD-10-CM | POA: Diagnosis not present

## 2012-03-15 DIAGNOSIS — I4891 Unspecified atrial fibrillation: Secondary | ICD-10-CM | POA: Diagnosis not present

## 2012-03-15 DIAGNOSIS — I319 Disease of pericardium, unspecified: Secondary | ICD-10-CM | POA: Diagnosis not present

## 2012-03-15 LAB — HYPERSENSITIVITY PNUEMONITIS PROFILE

## 2012-03-15 LAB — FUNGAL ANTIBODIES PANEL, ID-BLOOD
Aspergillus Flavus Antibodies: NEGATIVE
Aspergillus Niger Antibodies: NEGATIVE
Aspergillus fumigatus: NEGATIVE
Blastomyces Abs, Qn, DID: NEGATIVE
Coccidioides Antibody ID: NEGATIVE
Histoplasma Antibody, ID: NEGATIVE

## 2012-03-16 NOTE — Progress Notes (Signed)
Quick Note:  Called, spoke with pt. Informed her mold studies are normal. She verbalized understanding and voiced no further questions/concerns at this time. ______

## 2012-05-31 ENCOUNTER — Ambulatory Visit (INDEPENDENT_AMBULATORY_CARE_PROVIDER_SITE_OTHER)
Admission: RE | Admit: 2012-05-31 | Discharge: 2012-05-31 | Disposition: A | Discharge status: Home / Self Care | Payer: Medicare Other | Source: Ambulatory Visit | Attending: Critical Care Medicine | Admitting: Critical Care Medicine

## 2012-05-31 ENCOUNTER — Telehealth: Payer: Self-pay | Admitting: *Deleted

## 2012-05-31 DIAGNOSIS — R911 Solitary pulmonary nodule: Secondary | ICD-10-CM

## 2012-05-31 NOTE — Telephone Encounter (Signed)
Call report from [facility] radiology-- Please review CT chest 05/31/12.   Right mid lobe nodule markedly enlarged, consistent with small pulmonary mass measuring 2.5cm, concerning bronchogenic carcinoma.   Results are in Epic.   Dr Delford Field please advise, thanks.

## 2012-06-01 ENCOUNTER — Telehealth: Payer: Self-pay | Admitting: *Deleted

## 2012-06-01 NOTE — Telephone Encounter (Signed)
Pt called back. She can come in 06/09/12 at 2:45 in [facility] office. Pt is scheduled to come in at that time. appt was cancelled for 06/14/12. Nothing further was needed

## 2012-06-01 NOTE — Telephone Encounter (Signed)
Per PW, pt has an appt in [place] on Jan 28.  This needs to be rescheduled to [facility] office.  ---  Called, spoke with pt.  I have offered appt on Thursday, Jan 23 at 2:45 pm with PW.  Pt states she will have to call her family and will have to call back.  Will await call.  Note:  appt on Jan 28 still needs to be cancelled when pt calls back.

## 2012-06-01 NOTE — Telephone Encounter (Signed)
Pt is aware of results. 

## 2012-06-09 ENCOUNTER — Encounter: Payer: Self-pay | Admitting: Critical Care Medicine

## 2012-06-09 ENCOUNTER — Ambulatory Visit (INDEPENDENT_AMBULATORY_CARE_PROVIDER_SITE_OTHER): Payer: Medicare Other | Admitting: Critical Care Medicine

## 2012-06-09 VITALS — BP 122/86 | HR 78 | Temp 97.5°F | Ht 66.5 in | Wt 226.0 lb

## 2012-06-09 DIAGNOSIS — R911 Solitary pulmonary nodule: Secondary | ICD-10-CM | POA: Diagnosis not present

## 2012-06-09 NOTE — Progress Notes (Signed)
Subjective:    Patient ID: Barbara Nelson, female    DOB: 06-08-1940, 72 y.o.   MRN: [id number]  HPI  72 y.o.AAF referred for abn CT /pulm nodules. 10/2011 (RLL 6mm)  and 10/13 CTs (RML 7mm, RLL nodule is gone)   and one in 4/13 in [place] (RLL nodule).  Pt not having symptoms other than dyspnea.  Prior hx of cardiac tamponade in 4/13 at [facility]. No etiology for pericardial effusion discerned.  Pt had an URI in [place].  Pt then developed symptoms. Was in [place] with another daughter,  Pt went to [employer].   Pt had pericardial window.   Surg path ???  Now no cough, no chest pain, fever, chills, sweats, wheezing, no edema in legs.  Weight is stable. No heart burn or indigestion.  Afib d/t pericarditis and now on metoprolol.  Not sustained in Afib. PPD converted to positive in 80s.  CXR ok . 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    06/09/2012 There are no new symptoms compared to the last visit.  A repeat CT scan was performed in early January 2014 and reveals an enlarging right middle lobe nodule which is now over 1-1/2 cm in diameter. There are no other suspicious nodules or lymph nodes seen. The patient is here for further discussion regarding the nodule  Past Medical History  Diagnosis Date  . Cardiac tamponade due to viral pericarditis   . A-fib   . Pulmonary nodule      Family History  Problem Relation Age of Onset  . Hypertension Mother   . Ovarian cancer Mother   . Hypertension Father   . Stroke Father   . Prostate cancer Brother   . Thyroid disease Sister   . Hypertension Sister   . Leukemia Maternal Aunt      History   Social History  . Marital Status: Single    Spouse Name: N/A    Number of Children: 4  . Years of Education: N/A   Occupational History  . unemployed Charity fundraiser retired    Social History Main Topics  . Smoking status: Former Smoker -- 1.5 packs/day for 10 years    Types: Cigarettes    Quit date: 05/19/1979  . Smokeless tobacco:  Never Used  . Alcohol Use: No  . Drug Use: No  . Sexually Active: Not on file   Other Topics Concern  . Not on file   Social History Narrative  . No narrative on file     Allergies  Allergen Reactions  . Tape Hives     Outpatient Prescriptions Prior to Visit  Medication Sig Dispense Refill  . [DISCONTINUED] metoprolol succinate (TOPROL-XL) 25 MG 24 hr tablet Take 25 mg by mouth 2 (two) times daily.       Last reviewed on 06/09/2012  2:58 PM by Storm Frisk, MD     Review of Systems  Constitutional: Negative for fever, chills, diaphoresis, activity change, appetite change, fatigue and unexpected weight change.  HENT: Negative for hearing loss, ear pain, nosebleeds, congestion, sore throat, facial swelling, rhinorrhea, sneezing, mouth sores, trouble swallowing, neck pain, neck stiffness, dental problem, voice change, postnasal drip, sinus pressure, tinnitus and ear discharge.   Eyes: Negative for photophobia, discharge, itching and visual disturbance.  Respiratory: Positive for shortness of breath. Negative for apnea, cough, choking, chest tightness, wheezing and stridor.   Cardiovascular: Negative for chest pain, palpitations and leg swelling.  Gastrointestinal: Negative for  nausea, vomiting, abdominal pain, constipation, blood in stool and abdominal distention.  Genitourinary: Negative for dysuria, urgency, frequency, hematuria, flank pain, decreased urine volume and difficulty urinating.  Musculoskeletal: Negative for myalgias, back pain, joint swelling, arthralgias and gait problem.  Skin: Negative for color change, pallor and rash.  Neurological: Positive for dizziness. Negative for tremors, seizures, syncope, speech difficulty, weakness, light-headedness, numbness and headaches.  Hematological: Negative for adenopathy. Does not bruise/bleed easily.  Psychiatric/Behavioral: Negative for confusion, sleep disturbance and agitation. The patient is not nervous/anxious.         Objective:   Physical Exam  Filed Vitals:   06/09/12 1438  BP: 122/86  Pulse: 78  Temp: 97.5 F (36.4 C)  TempSrc: Oral  Height: 5' 6.5" (1.689 m)  Weight: 226 lb (102.513 kg)  SpO2: 97%    Gen: Pleasant, well-nourished, in no distress,  normal affect  ENT: No lesions,  mouth clear,  oropharynx clear, no postnasal drip  Neck: No JVD, no TMG, no carotid bruits  Lungs: No use of accessory muscles, no dullness to percussion, clear   Cardiovascular: RRR, heart sounds normal, no murmur or gallops, no peripheral edema  Abdomen: soft and NT, no HSM,  BS normal  Musculoskeletal: No deformities, no cyanosis or clubbing  Neuro: alert, non focal  Skin: Warm, no lesions or rashes    All imaging studies have been reviewed        Assessment & Plan:   Pulmonary nodule, right Progressive increase in size in right middle lobe nodule is worrisome for malignancy No other suspicious nodules are seen to have changed in size in particular the right lower lobe nodule it was previously seen in June 2013 is now no longer visualized The best approach here would be electronically medication of bronchoscopy however the patient is desiring a referral to [facility] for second opinion Plan Refer to thoracic surgery at [facility] for second opinion The patient will call back if she wishes further workup or evaluation occur in the [facility]      Updated Medication List Outpatient Encounter Prescriptions as of 06/09/2012  Medication Sig Dispense Refill  . ibuprofen (ADVIL,MOTRIN) 400 MG tablet Take 400 mg by mouth every 6 (six) hours as needed.      . metoprolol tartrate (LOPRESSOR) 25 MG tablet Take 1 tablet by mouth Twice daily.      . [DISCONTINUED] metoprolol succinate (TOPROL-XL) 25 MG 24 hr tablet Take 25 mg by mouth 2 (two) times daily.

## 2012-06-09 NOTE — Patient Instructions (Addendum)
A second opinion on your nodule will be made with Dr Denyse Dago at [facility] as needed

## 2012-06-10 NOTE — Assessment & Plan Note (Signed)
Progressive increase in size in right middle lobe nodule is worrisome for malignancy No other suspicious nodules are seen to have changed in size in particular the right lower lobe nodule it was previously seen in June 2013 is now no longer visualized The best approach here would be electronically medication of bronchoscopy however the patient is desiring a referral to [facility] for second opinion Plan Refer to thoracic surgery at [facility] for second opinion The patient will call back if she wishes further workup or evaluation occur in the [facility]

## 2012-06-14 ENCOUNTER — Ambulatory Visit: Payer: Medicare Other | Admitting: Critical Care Medicine

## 2012-06-23 DIAGNOSIS — Z79899 Other long term (current) drug therapy: Secondary | ICD-10-CM | POA: Diagnosis not present

## 2012-06-23 DIAGNOSIS — C3490 Malignant neoplasm of unspecified part of unspecified bronchus or lung: Secondary | ICD-10-CM | POA: Diagnosis not present

## 2012-06-23 DIAGNOSIS — R911 Solitary pulmonary nodule: Secondary | ICD-10-CM | POA: Diagnosis not present

## 2012-06-27 DIAGNOSIS — Z9889 Other specified postprocedural states: Secondary | ICD-10-CM | POA: Diagnosis not present

## 2012-06-27 DIAGNOSIS — I4891 Unspecified atrial fibrillation: Secondary | ICD-10-CM | POA: Diagnosis not present

## 2012-06-27 DIAGNOSIS — I319 Disease of pericardium, unspecified: Secondary | ICD-10-CM | POA: Diagnosis not present

## 2012-08-04 DIAGNOSIS — I314 Cardiac tamponade: Secondary | ICD-10-CM | POA: Diagnosis not present

## 2012-08-04 DIAGNOSIS — I4891 Unspecified atrial fibrillation: Secondary | ICD-10-CM | POA: Diagnosis not present

## 2012-08-04 DIAGNOSIS — R911 Solitary pulmonary nodule: Secondary | ICD-10-CM | POA: Diagnosis not present

## 2012-08-08 DIAGNOSIS — J90 Pleural effusion, not elsewhere classified: Secondary | ICD-10-CM | POA: Diagnosis not present

## 2012-08-08 DIAGNOSIS — I4891 Unspecified atrial fibrillation: Secondary | ICD-10-CM | POA: Diagnosis present

## 2012-08-08 DIAGNOSIS — Z4682 Encounter for fitting and adjustment of non-vascular catheter: Secondary | ICD-10-CM | POA: Diagnosis not present

## 2012-08-08 DIAGNOSIS — T797XXA Traumatic subcutaneous emphysema, initial encounter: Secondary | ICD-10-CM | POA: Diagnosis not present

## 2012-08-08 DIAGNOSIS — B409 Blastomycosis, unspecified: Secondary | ICD-10-CM | POA: Diagnosis present

## 2012-08-08 DIAGNOSIS — M3130 Wegener's granulomatosis without renal involvement: Secondary | ICD-10-CM | POA: Diagnosis not present

## 2012-08-08 DIAGNOSIS — C3490 Malignant neoplasm of unspecified part of unspecified bronchus or lung: Secondary | ICD-10-CM | POA: Diagnosis not present

## 2012-08-08 DIAGNOSIS — J8410 Pulmonary fibrosis, unspecified: Secondary | ICD-10-CM | POA: Diagnosis present

## 2012-08-08 DIAGNOSIS — I447 Left bundle-branch block, unspecified: Secondary | ICD-10-CM | POA: Diagnosis present

## 2012-08-08 DIAGNOSIS — J9383 Other pneumothorax: Secondary | ICD-10-CM | POA: Diagnosis not present

## 2012-08-08 DIAGNOSIS — R911 Solitary pulmonary nodule: Secondary | ICD-10-CM | POA: Diagnosis not present

## 2012-08-08 DIAGNOSIS — G8912 Acute post-thoracotomy pain: Secondary | ICD-10-CM | POA: Diagnosis not present

## 2012-08-08 DIAGNOSIS — J9819 Other pulmonary collapse: Secondary | ICD-10-CM | POA: Diagnosis not present

## 2012-08-23 DIAGNOSIS — B409 Blastomycosis, unspecified: Secondary | ICD-10-CM | POA: Diagnosis not present

## 2012-08-23 DIAGNOSIS — B401 Chronic pulmonary blastomycosis: Secondary | ICD-10-CM | POA: Insufficient documentation

## 2012-08-23 DIAGNOSIS — R911 Solitary pulmonary nodule: Secondary | ICD-10-CM | POA: Diagnosis not present

## 2012-08-23 DIAGNOSIS — J9819 Other pulmonary collapse: Secondary | ICD-10-CM | POA: Diagnosis not present

## 2012-08-23 DIAGNOSIS — J90 Pleural effusion, not elsewhere classified: Secondary | ICD-10-CM | POA: Diagnosis not present

## 2012-09-12 DIAGNOSIS — Z79899 Other long term (current) drug therapy: Secondary | ICD-10-CM | POA: Diagnosis not present

## 2012-09-12 DIAGNOSIS — B409 Blastomycosis, unspecified: Secondary | ICD-10-CM | POA: Diagnosis not present

## 2012-10-24 DIAGNOSIS — Z792 Long term (current) use of antibiotics: Secondary | ICD-10-CM | POA: Diagnosis not present

## 2012-10-24 DIAGNOSIS — B409 Blastomycosis, unspecified: Secondary | ICD-10-CM | POA: Diagnosis not present

## 2013-01-02 DIAGNOSIS — B409 Blastomycosis, unspecified: Secondary | ICD-10-CM | POA: Diagnosis not present

## 2013-01-02 DIAGNOSIS — Z79899 Other long term (current) drug therapy: Secondary | ICD-10-CM | POA: Diagnosis not present

## 2013-01-02 DIAGNOSIS — R911 Solitary pulmonary nodule: Secondary | ICD-10-CM | POA: Diagnosis not present

## 2013-01-14 IMAGING — CT CT CHEST W/ CM
2 of 3 series · 14 of 31 positions shown, 16 images · IV contrast (omnipaque)
Comparison: Chest x-ray [DATE].

CLINICAL DATA: Prior history of pericardial effusion status post
drainage.  Evaluate for lymphadenopathy.  Prior history of smoking.

CT CHEST WITH CONTRAST
TECHNIQUE: Multidetector CT imaging of the chest was performed
following the standard protocol during bolus administration of
intravenous contrast.
Contrast: 75mL OMNIPAQUE IOHEXOL 300 MG/ML  SOLN

[Series 3: chest with · axial · 0.70mm/px · z∈[-232,-7]mm · 6 of 63 slices shown, 8 images]
[im 9/63  mediastinal]
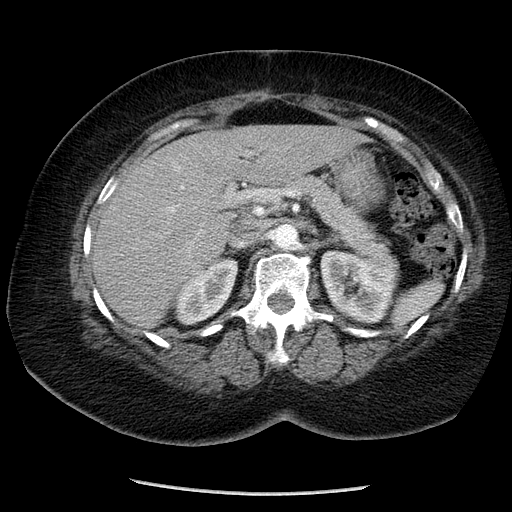
[im 9/63  lung]
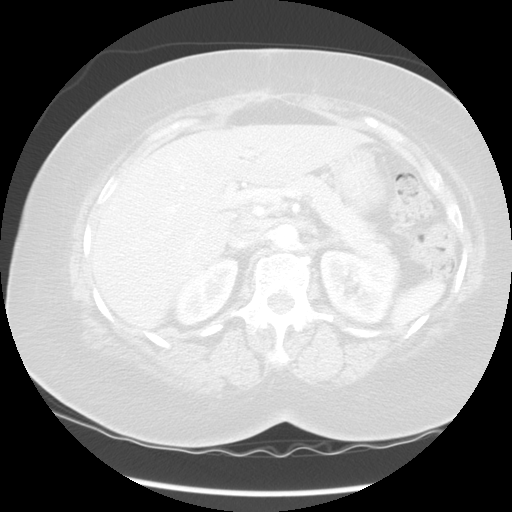
[im 18/63  lung]
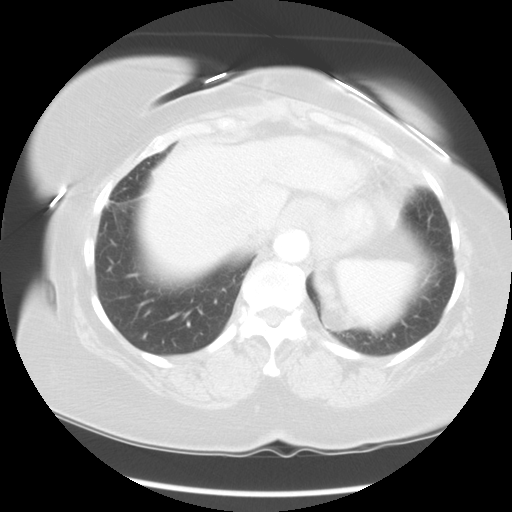
[im 27/63  lung]
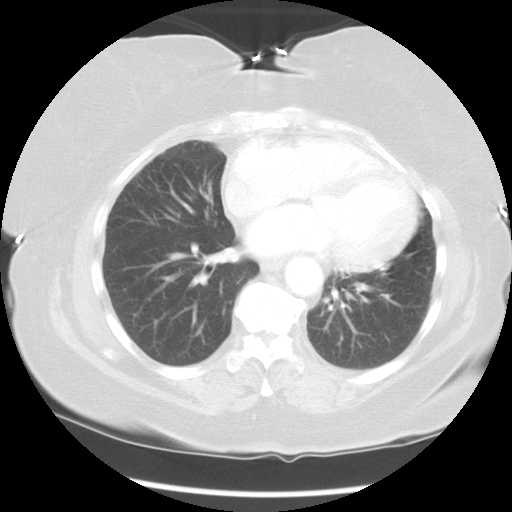
[im 36/63  lung]
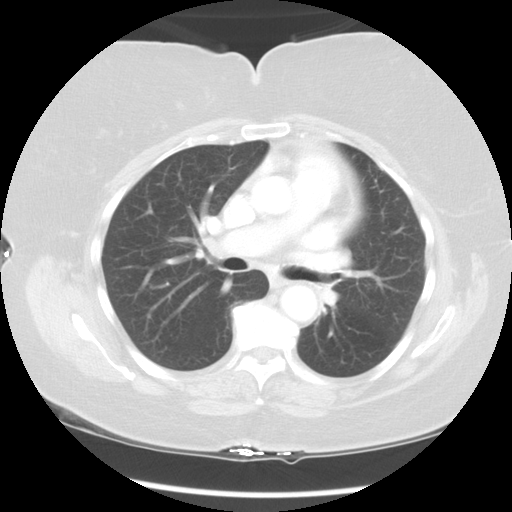
[im 45/63  mediastinal]
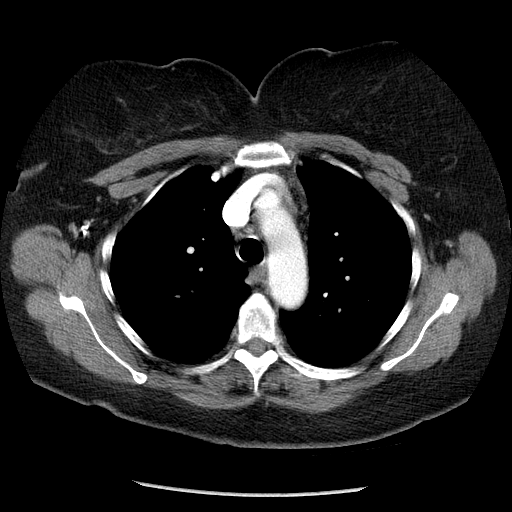
[im 45/63  lung]
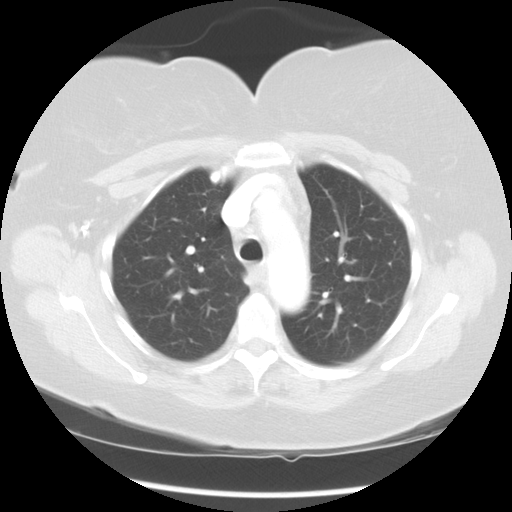
[im 54/63  lung]
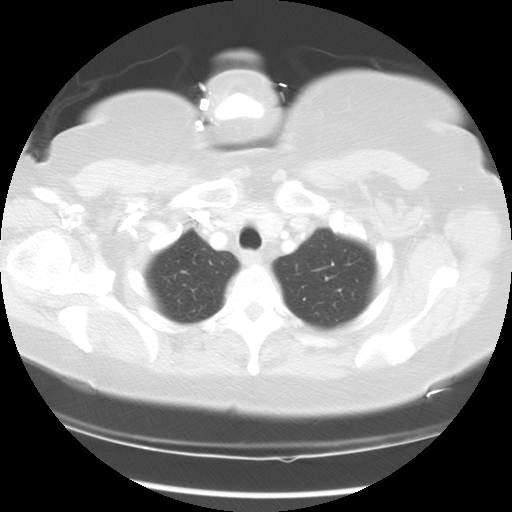

[Series 602: sagittal body · sagittal · 0.70mm/px · 8 of 145 slices shown]
[im 16/145  mediastinal]
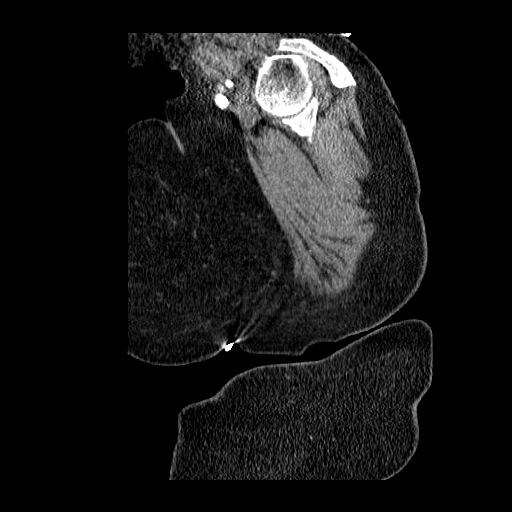
[im 31/145  mediastinal]
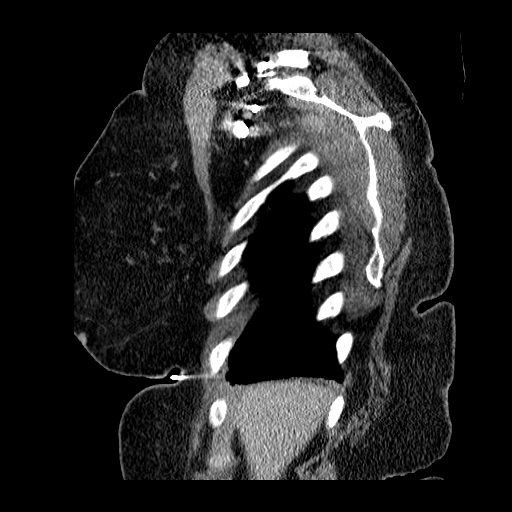
[im 46/145  mediastinal]
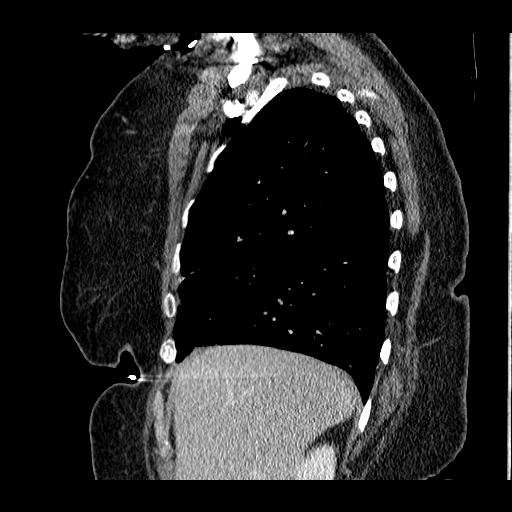
[im 69/145  mediastinal]
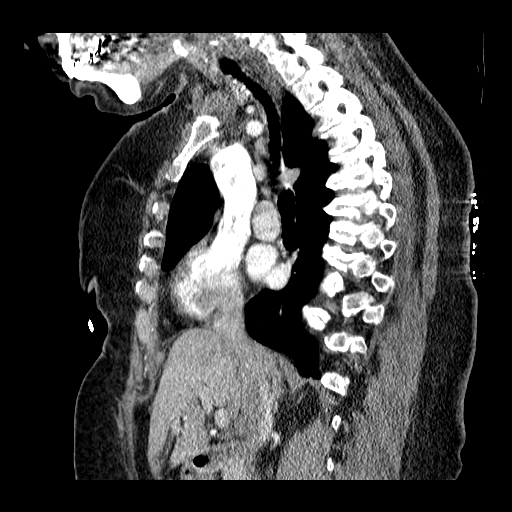
[im 76/145  mediastinal]
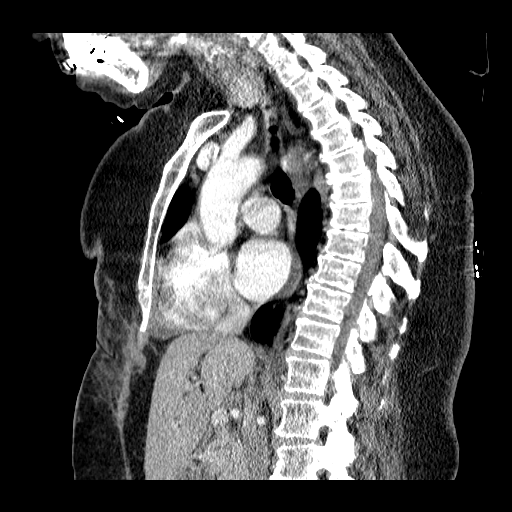
[im 99/145  mediastinal]
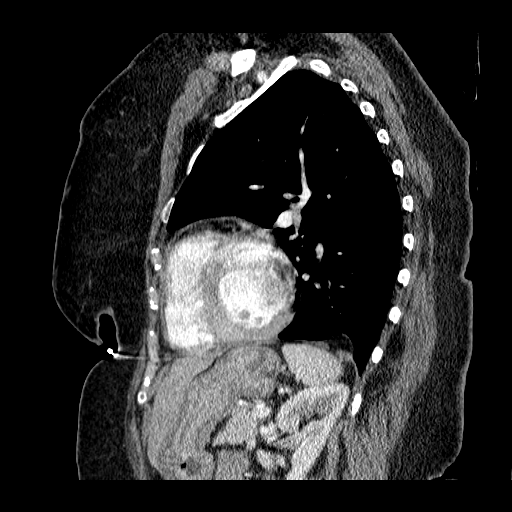
[im 114/145  mediastinal]
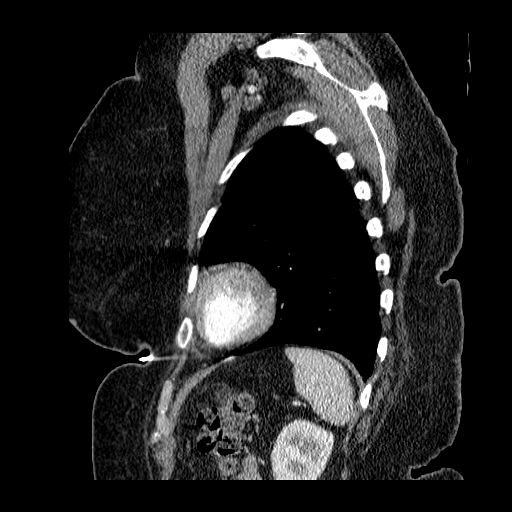
[im 129/145  mediastinal]
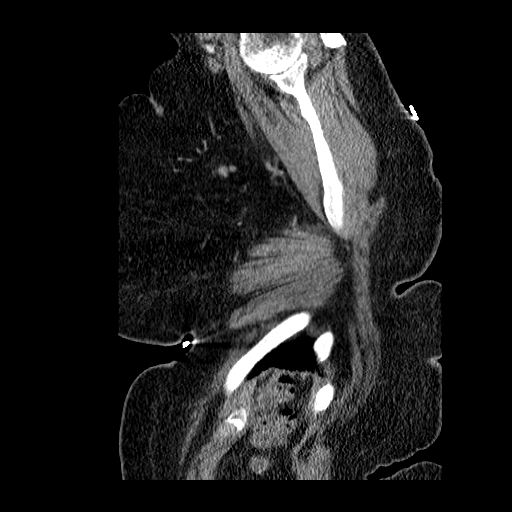

[14 of 31 positions shown; findings below may reference images not displayed]

FINDINGS: Mediastinum: Heart size is normal. There is no significant
pericardial fluid, thickening or pericardial calcification. No
pathologically enlarged mediastinal or hilar lymph nodes. Esophagus
is unremarkable in appearance.

Lungs/Pleura: A small amount of atelectasis or scarring in the
right middle lobe. Focal area of mild pleural thickening in the
lateral aspect of the right hemithorax inferiorly is likely to
represent scarring.  4 mm ground-glass attenuation nodule in the
medial aspect of the left lower lobe (image 33 of series 4) which
is statistically benign, and 6 mm nodule in the posterior aspect of
the right lower lobe (image 47 of series 4) are noted.  No larger
more suspicious appearing pulmonary nodules or masses are otherwise
identified. No acute consolidative airspace disease.  No pleural
effusions.  Small left-sided Bochdalek hernia is incidentally
noted.

Upper Abdomen: A small calcification in the anterior aspect of the
liver is likely a calcified granuloma.

Musculoskeletal: Healed midline of the lower thoracic and upper
abdominal wound. There are no aggressive appearing lytic or blastic
lesions noted in the visualized portions of the skeleton.
IMPRESSION: 1.  No significant pericardial effusion at this time.
2.  No definite pathologically enlarged mediastinal or hilar lymph
nodes are noted at this time.
3.  6 mm pulmonary nodule in the right lower lobe. If the patient
is at high risk for bronchogenic carcinoma, follow-up chest CT at 6-
12 months is recommended.  If the patient is at low risk for
bronchogenic carcinoma, follow-up chest CT at 12 months is
recommended.  This recommendation follows the consensus statement:
Guidelines for Management of Small Pulmonary Nodules Detected on CT
Scans: A Statement from the [HOSPITAL] as published in
4.  Additional incidental findings, as detailed above.

## 2013-03-13 DIAGNOSIS — B409 Blastomycosis, unspecified: Secondary | ICD-10-CM | POA: Diagnosis not present

## 2013-03-16 DIAGNOSIS — J069 Acute upper respiratory infection, unspecified: Secondary | ICD-10-CM | POA: Diagnosis not present

## 2013-05-19 ENCOUNTER — Encounter: Payer: Self-pay | Admitting: Cardiology

## 2013-06-19 ENCOUNTER — Encounter: Payer: Self-pay | Admitting: General Surgery

## 2013-06-19 DIAGNOSIS — Z9889 Other specified postprocedural states: Secondary | ICD-10-CM | POA: Insufficient documentation

## 2013-06-26 ENCOUNTER — Ambulatory Visit: Payer: Medicare Other | Admitting: Cardiology

## 2013-06-27 ENCOUNTER — Ambulatory Visit (INDEPENDENT_AMBULATORY_CARE_PROVIDER_SITE_OTHER): Payer: Medicare Other | Admitting: Cardiology

## 2013-06-27 ENCOUNTER — Encounter: Payer: Self-pay | Admitting: Cardiology

## 2013-06-27 VITALS — BP 140/100 | HR 77 | Ht 66.5 in | Wt 232.8 lb

## 2013-06-27 DIAGNOSIS — I4891 Unspecified atrial fibrillation: Secondary | ICD-10-CM | POA: Diagnosis not present

## 2013-06-27 DIAGNOSIS — R600 Localized edema: Secondary | ICD-10-CM

## 2013-06-27 DIAGNOSIS — I9789 Other postprocedural complications and disorders of the circulatory system, not elsewhere classified: Secondary | ICD-10-CM

## 2013-06-27 DIAGNOSIS — R609 Edema, unspecified: Secondary | ICD-10-CM | POA: Diagnosis not present

## 2013-06-27 DIAGNOSIS — R918 Other nonspecific abnormal finding of lung field: Secondary | ICD-10-CM | POA: Insufficient documentation

## 2013-06-27 DIAGNOSIS — I300 Acute nonspecific idiopathic pericarditis: Secondary | ICD-10-CM

## 2013-06-27 DIAGNOSIS — R030 Elevated blood-pressure reading, without diagnosis of hypertension: Secondary | ICD-10-CM

## 2013-06-27 DIAGNOSIS — Z9889 Other specified postprocedural states: Secondary | ICD-10-CM | POA: Diagnosis not present

## 2013-06-27 DIAGNOSIS — IMO0001 Reserved for inherently not codable concepts without codable children: Secondary | ICD-10-CM

## 2013-06-27 DIAGNOSIS — B3323 Viral pericarditis: Secondary | ICD-10-CM

## 2013-06-27 MED ORDER — METOPROLOL TARTRATE 25 MG PO TABS: 12.5000 mg | ORAL_TABLET | Freq: Two times a day (BID) | ORAL | Status: DC

## 2013-06-27 NOTE — Progress Notes (Signed)
[place], [place], [place]  [address] Phone: [phone] Fax:  [phone]  Date:  06/27/2013   ID:  Barbara Nelson, DOB 1940/10/16, MRN [id number]  PCP:  Milagros Evener, MD  Cardiologist:  Fransico Him, MD     History of Present Illness: Barbara Nelson is a 73 y.o. female with a history of viral pericarditis with tamponade s/p pericardial window and anterior partial pericardiectomy in 2013 with post op atrial fibrillation which she has not had any further problems with.  At the time of her surgery she was noted to have a lung nodule which has been followed by Dr. Joya Gaskins and last January increased in size suspicious for malignancy and underwent lung resection at [facility].   Wt Readings from Last 3 Encounters:  06/27/13 232 lb 12.8 oz (105.597 kg)  06/09/12 226 lb (102.513 kg)  03/09/12 228 lb 3.2 oz (103.511 kg)     Past Medical History  Diagnosis Date  . Cardiac tamponade due to viral pericarditis   . A-fib   . Pulmonary nodule   . Pericarditis, viral   . Postoperative atrial fibrillation   . Lung cancer     Current Outpatient Prescriptions  Medication Sig Dispense Refill  . ibuprofen (ADVIL,MOTRIN) 400 MG tablet Take 400 mg by mouth every 6 (six) hours as needed.      . metoprolol tartrate (LOPRESSOR) 25 MG tablet Take 12.5 mg by mouth Twice daily.        No current facility-administered medications for this visit.    Allergies:    Allergies  Allergen Reactions  . Tape Hives  . Amoxicillin Rash    Social History:  The patient  reports that she quit smoking about 34 years ago. Her smoking use included Cigarettes. She has a 15 pack-year smoking history. She has never used smokeless tobacco. She reports that she does not drink alcohol or use illicit drugs.   Family History:  The patient's family history includes Hypertension in her father, mother, and sister; Leukemia in her maternal aunt; Ovarian cancer in her mother; Prostate cancer in her  brother; Stroke in her father; Thyroid disease in her sister.   ROS:  Please see the history of present illness.      All other systems reviewed and negative.   PHYSICAL EXAM: VS:  BP 140/100  Pulse 77  Ht 5' 6.5" (1.689 m)  Wt 232 lb 12.8 oz (105.597 kg)  BMI 37.02 kg/m2 Well nourished, well developed, in no acute distress HEENT: normal Neck: no JVD Cardiac:  normal S1, S2; RRR; no murmur Lungs:  clear to auscultation bilaterally, no wheezing, rhonchi or rales Abd: soft, nontender, no hepatomegaly Ext: trace edema Skin: warm and dry Neuro:  CNs 2-12 intact, no focal abnormalities noted  EKG:  NSR with RAE and nonspecific IVCD     ASSESSMENT AND PLAN:  1. History of remote viral pericarditis with tamponade s/p pericardial window and partial pericardiectomhy 2. Post op afib with no reoccurence  - continue Metoprolol 3. Elevated BP - she ate pork barbecue yesterday   - I have asked her to check her BP daily for a week and call with the results.   - continue current dose of metoprolol for now.  I wanted to increase to 1 tablet daily but patient refused. 4.  Mild LE edema - She does not want a diuretic.  I have recommended that she follow a low sodium diet.  Followup with me in 1 year  Signed, Fransico Him, MD 06/27/2013 4:18 PM

## 2013-06-27 NOTE — Patient Instructions (Addendum)
Your physician recommends that you continue on your current medications as directed. Please refer to the Current Medication list given to you today.  Check BP daily for one week and call with the results.   I sent a refill of your lopressor into your pharmacy. It should be ready within the next hour or so  Your physician wants you to follow-up in: 12 months with Dr Mallie Snooks will receive a reminder letter in the mail two months in advance. If you don't receive a letter, please call our office to schedule the follow-up appointment.

## 2013-06-28 ENCOUNTER — Ambulatory Visit: Payer: Medicare Other | Admitting: Cardiology

## 2013-07-07 ENCOUNTER — Telehealth: Payer: Self-pay | Admitting: Cardiology

## 2013-07-07 NOTE — Telephone Encounter (Signed)
Good BP readings - continue current meds

## 2013-07-07 NOTE — Telephone Encounter (Signed)
Pt.notified

## 2013-07-07 NOTE — Telephone Encounter (Signed)
To Dr. Radford Pax.

## 2013-07-07 NOTE — Telephone Encounter (Signed)
New problem   This are pt's BP reading has requested by doctor.  07/03/13     127/78     07/04/13     123/72 07/05/13     132/72  Afternoon 142/76 07/06/13     132/77 07/07/13     116/69

## 2013-08-03 DIAGNOSIS — R0602 Shortness of breath: Secondary | ICD-10-CM | POA: Diagnosis not present

## 2013-08-03 DIAGNOSIS — R911 Solitary pulmonary nodule: Secondary | ICD-10-CM | POA: Diagnosis not present

## 2013-08-03 DIAGNOSIS — B409 Blastomycosis, unspecified: Secondary | ICD-10-CM | POA: Diagnosis not present

## 2013-08-03 DIAGNOSIS — J984 Other disorders of lung: Secondary | ICD-10-CM | POA: Diagnosis not present

## 2013-08-12 ENCOUNTER — Emergency Department (HOSPITAL_BASED_OUTPATIENT_CLINIC_OR_DEPARTMENT_OTHER): Payer: No Typology Code available for payment source

## 2013-08-12 ENCOUNTER — Encounter (HOSPITAL_BASED_OUTPATIENT_CLINIC_OR_DEPARTMENT_OTHER): Payer: Self-pay | Admitting: Emergency Medicine

## 2013-08-12 ENCOUNTER — Emergency Department (HOSPITAL_BASED_OUTPATIENT_CLINIC_OR_DEPARTMENT_OTHER)
Admission: EM | Admit: 2013-08-12 | Discharge: 2013-08-12 | Disposition: A | Discharge status: Home / Self Care | Payer: No Typology Code available for payment source | Attending: Emergency Medicine | Admitting: Emergency Medicine

## 2013-08-12 DIAGNOSIS — Y92410 Unspecified street and highway as the place of occurrence of the external cause: Secondary | ICD-10-CM | POA: Insufficient documentation

## 2013-08-12 DIAGNOSIS — Z79899 Other long term (current) drug therapy: Secondary | ICD-10-CM | POA: Diagnosis not present

## 2013-08-12 DIAGNOSIS — I4891 Unspecified atrial fibrillation: Secondary | ICD-10-CM | POA: Diagnosis not present

## 2013-08-12 DIAGNOSIS — Y9389 Activity, other specified: Secondary | ICD-10-CM | POA: Insufficient documentation

## 2013-08-12 DIAGNOSIS — Z8619 Personal history of other infectious and parasitic diseases: Secondary | ICD-10-CM | POA: Diagnosis not present

## 2013-08-12 DIAGNOSIS — M25519 Pain in unspecified shoulder: Secondary | ICD-10-CM | POA: Diagnosis not present

## 2013-08-12 DIAGNOSIS — S40019A Contusion of unspecified shoulder, initial encounter: Secondary | ICD-10-CM | POA: Diagnosis not present

## 2013-08-12 DIAGNOSIS — S4980XA Other specified injuries of shoulder and upper arm, unspecified arm, initial encounter: Secondary | ICD-10-CM | POA: Diagnosis not present

## 2013-08-12 DIAGNOSIS — S139XXA Sprain of joints and ligaments of unspecified parts of neck, initial encounter: Secondary | ICD-10-CM | POA: Diagnosis not present

## 2013-08-12 DIAGNOSIS — S199XXA Unspecified injury of neck, initial encounter: Secondary | ICD-10-CM | POA: Diagnosis present

## 2013-08-12 DIAGNOSIS — Z880 Allergy status to penicillin: Secondary | ICD-10-CM | POA: Insufficient documentation

## 2013-08-12 DIAGNOSIS — M542 Cervicalgia: Secondary | ICD-10-CM | POA: Diagnosis not present

## 2013-08-12 DIAGNOSIS — S161XXA Strain of muscle, fascia and tendon at neck level, initial encounter: Secondary | ICD-10-CM

## 2013-08-12 DIAGNOSIS — Z87891 Personal history of nicotine dependence: Secondary | ICD-10-CM | POA: Insufficient documentation

## 2013-08-12 DIAGNOSIS — Z91040 Latex allergy status: Secondary | ICD-10-CM | POA: Diagnosis not present

## 2013-08-12 DIAGNOSIS — S298XXA Other specified injuries of thorax, initial encounter: Secondary | ICD-10-CM | POA: Diagnosis not present

## 2013-08-12 DIAGNOSIS — S0993XA Unspecified injury of face, initial encounter: Secondary | ICD-10-CM | POA: Diagnosis present

## 2013-08-12 DIAGNOSIS — S46909A Unspecified injury of unspecified muscle, fascia and tendon at shoulder and upper arm level, unspecified arm, initial encounter: Secondary | ICD-10-CM | POA: Diagnosis not present

## 2013-08-12 MED ORDER — HYDROCODONE-ACETAMINOPHEN 5-325 MG PO TABS: 1.0000 | ORAL_TABLET | Freq: Four times a day (QID) | ORAL | Status: DC | PRN
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
## 2013-08-14 DIAGNOSIS — M62838 Other muscle spasm: Secondary | ICD-10-CM | POA: Diagnosis not present

## 2013-09-06 DIAGNOSIS — J438 Other emphysema: Secondary | ICD-10-CM | POA: Diagnosis not present

## 2013-09-07 DIAGNOSIS — J438 Other emphysema: Secondary | ICD-10-CM | POA: Diagnosis not present

## 2013-09-11 DIAGNOSIS — J438 Other emphysema: Secondary | ICD-10-CM | POA: Diagnosis not present

## 2013-09-12 DIAGNOSIS — M7710 Lateral epicondylitis, unspecified elbow: Secondary | ICD-10-CM | POA: Diagnosis not present

## 2013-09-12 DIAGNOSIS — IMO0001 Reserved for inherently not codable concepts without codable children: Secondary | ICD-10-CM | POA: Diagnosis not present

## 2013-09-13 DIAGNOSIS — J438 Other emphysema: Secondary | ICD-10-CM | POA: Diagnosis not present

## 2013-09-14 DIAGNOSIS — J438 Other emphysema: Secondary | ICD-10-CM | POA: Diagnosis not present

## 2013-09-18 DIAGNOSIS — J438 Other emphysema: Secondary | ICD-10-CM | POA: Diagnosis not present

## 2013-09-20 DIAGNOSIS — J438 Other emphysema: Secondary | ICD-10-CM | POA: Diagnosis not present

## 2013-09-21 DIAGNOSIS — J438 Other emphysema: Secondary | ICD-10-CM | POA: Diagnosis not present

## 2013-09-25 DIAGNOSIS — J438 Other emphysema: Secondary | ICD-10-CM | POA: Diagnosis not present

## 2013-09-27 DIAGNOSIS — J438 Other emphysema: Secondary | ICD-10-CM | POA: Diagnosis not present

## 2013-09-28 DIAGNOSIS — J438 Other emphysema: Secondary | ICD-10-CM | POA: Diagnosis not present

## 2013-10-02 DIAGNOSIS — J438 Other emphysema: Secondary | ICD-10-CM | POA: Diagnosis not present

## 2013-10-04 DIAGNOSIS — J438 Other emphysema: Secondary | ICD-10-CM | POA: Diagnosis not present

## 2013-10-05 DIAGNOSIS — J438 Other emphysema: Secondary | ICD-10-CM | POA: Diagnosis not present

## 2013-10-11 DIAGNOSIS — J438 Other emphysema: Secondary | ICD-10-CM | POA: Diagnosis not present

## 2013-10-12 DIAGNOSIS — J438 Other emphysema: Secondary | ICD-10-CM | POA: Diagnosis not present

## 2013-10-16 DIAGNOSIS — J438 Other emphysema: Secondary | ICD-10-CM | POA: Diagnosis not present

## 2013-11-15 DIAGNOSIS — J438 Other emphysema: Secondary | ICD-10-CM | POA: Diagnosis not present

## 2013-11-20 DIAGNOSIS — J438 Other emphysema: Secondary | ICD-10-CM | POA: Diagnosis not present

## 2013-11-22 DIAGNOSIS — J438 Other emphysema: Secondary | ICD-10-CM | POA: Diagnosis not present

## 2013-11-23 DIAGNOSIS — J438 Other emphysema: Secondary | ICD-10-CM | POA: Diagnosis not present

## 2013-11-27 DIAGNOSIS — J438 Other emphysema: Secondary | ICD-10-CM | POA: Diagnosis not present

## 2013-11-29 DIAGNOSIS — J438 Other emphysema: Secondary | ICD-10-CM | POA: Diagnosis not present

## 2013-11-30 DIAGNOSIS — J438 Other emphysema: Secondary | ICD-10-CM | POA: Diagnosis not present

## 2014-02-27 DIAGNOSIS — H2513 Age-related nuclear cataract, bilateral: Secondary | ICD-10-CM | POA: Diagnosis not present

## 2014-03-09 DIAGNOSIS — J069 Acute upper respiratory infection, unspecified: Secondary | ICD-10-CM | POA: Diagnosis not present

## 2014-03-09 DIAGNOSIS — R0981 Nasal congestion: Secondary | ICD-10-CM | POA: Diagnosis not present

## 2014-03-09 DIAGNOSIS — R05 Cough: Secondary | ICD-10-CM | POA: Diagnosis not present

## 2014-04-02 DIAGNOSIS — M25512 Pain in left shoulder: Secondary | ICD-10-CM | POA: Diagnosis not present

## 2014-04-13 ENCOUNTER — Other Ambulatory Visit: Payer: Self-pay | Admitting: Cardiology

## 2014-06-28 ENCOUNTER — Encounter: Payer: Self-pay | Admitting: Cardiology

## 2014-06-28 ENCOUNTER — Ambulatory Visit (INDEPENDENT_AMBULATORY_CARE_PROVIDER_SITE_OTHER): Payer: Medicare Other | Admitting: Cardiology

## 2014-06-28 VITALS — BP 120/90 | HR 71 | Ht 65.5 in | Wt 231.8 lb

## 2014-06-28 DIAGNOSIS — I319 Disease of pericardium, unspecified: Secondary | ICD-10-CM

## 2014-06-28 DIAGNOSIS — I3139 Other pericardial effusion (noninflammatory): Secondary | ICD-10-CM

## 2014-06-28 DIAGNOSIS — I9789 Other postprocedural complications and disorders of the circulatory system, not elsewhere classified: Secondary | ICD-10-CM

## 2014-06-28 DIAGNOSIS — I313 Pericardial effusion (noninflammatory): Secondary | ICD-10-CM

## 2014-06-28 DIAGNOSIS — Z9889 Other specified postprocedural states: Secondary | ICD-10-CM

## 2014-06-28 DIAGNOSIS — R030 Elevated blood-pressure reading, without diagnosis of hypertension: Secondary | ICD-10-CM

## 2014-06-28 DIAGNOSIS — I4891 Unspecified atrial fibrillation: Secondary | ICD-10-CM

## 2014-06-28 DIAGNOSIS — IMO0001 Reserved for inherently not codable concepts without codable children: Secondary | ICD-10-CM

## 2014-06-28 MED ORDER — METOPROLOL TARTRATE 25 MG PO TABS: 12.5000 mg | ORAL_TABLET | Freq: Two times a day (BID) | ORAL | Status: DC

## 2014-06-28 NOTE — Progress Notes (Signed)
Cardiology Office Note   Date:  06/28/2014   ID:  Barbara Nelson, DOB 08/12/1940, MRN [id number]  PCP:  Milagros Evener, MD  Cardiologist:   [name], MD   Chief Complaint  Patient presents with  . Pericardial Effusion  . Atrial Fibrillation      History of Present Illness:  Barbara Nelson is a 74 y.o. female with a history of viral pericarditis with tamponade s/p pericardial window and anterior partial pericardiectomy in 2013 with post op atrial fibrillation which she has not had any further problems with. She also has a chronic LBBB. At the time of her surgery she was noted to have a lung nodule which has been followed by Dr. Joya Gaskins and last January increased in size suspicious for malignancy and underwent lung resection at [facility] showing a fungal infection.  She denies any chest pain or SOB.  She denies any palpitations, dizziness or syncope.  She denies any LE edema unless she eats salty food.    She is able to walk a few flights of stairs and can walk from one end of the mall to the other without any problems.      Past Medical History  Diagnosis Date  . Cardiac tamponade due to viral pericarditis   . A-fib   . Pulmonary nodule   . Pericarditis, viral   . Postoperative atrial fibrillation   . Lung mass     fungal infection s/p resection at [facility]    Past Surgical History  Procedure Laterality Date  . Chest tube placement  09/03/11  . Subxiphoid pericardial window and drainage anterior partial pericardiectomy  09/03/11  . Right knee arthoscopy with meniscal repair    . Abdominal hysterectomy  1988  . Lung removal, partial       Current Outpatient Prescriptions  Medication Sig Dispense Refill  . Glycerin-Polysorbate 80 (REFRESH DRY EYE THERAPY OP) Apply 1-2 drops to eye daily.    Marland Kitchen HYDROcodone-acetaminophen (NORCO/VICODIN) 5-325 MG per tablet Take 1-2 tablets by mouth every 6 (six) hours as needed. 15 tablet 0  . ibuprofen (ADVIL,MOTRIN) 400 MG tablet  Take 400 mg by mouth every 6 (six) hours as needed.    . metoprolol tartrate (LOPRESSOR) 25 MG tablet TAKE 1/2 TABLET BY MOUTH TWICE DAILY 90 tablet 10   No current facility-administered medications for this visit.    Allergies:   Tape; Z-pak; Amoxicillin; and Latex    Social History:  The patient  reports that she quit smoking about 35 years ago. Her smoking use included Cigarettes. She has a 15 pack-year smoking history. She has never used smokeless tobacco. She reports that she does not drink alcohol or use illicit drugs.   Family History:  The patient's family history includes Hypertension in her father, mother, and sister; Leukemia in her maternal aunt; Ovarian cancer in her mother; Prostate cancer in her brother; Stroke in her father; Thyroid disease in her sister.    ROS:  Please see the history of present illness.   Otherwise, review of systems are positive for none.   All other systems are reviewed and negative.    PHYSICAL EXAM: VS:  BP 120/90 mmHg  Pulse 71  Ht 5' 5.5" (1.664 m)  Wt 231 lb 12.8 oz (105.144 kg)  BMI 37.97 kg/m2 , BMI Body mass index is 37.97 kg/(m^2). GEN: Well nourished, well developed, in no acute distress HEENT: normal Neck: no JVD, carotid bruits, or masses Cardiac: RRR; no murmurs, rubs, or gallops,no edema  Respiratory:  clear to auscultation bilaterally, normal work of breathing GI: soft, nontender, nondistended, + BS MS: no deformity or atrophy Skin: warm and dry, no rash Neuro:  Strength and sensation are intact Psych: euthymic mood, full affect   EKG:  EKG is not ordered today. The ekg ordered today demonstrates NSR with nonspecific IVCD   Recent Labs: No results found for requested labs within last 365 days.    Lipid Panel No results found for: CHOL, TRIG, HDL, CHOLHDL, VLDL, LDLCALC, LDLDIRECT    Wt Readings from Last 3 Encounters:  06/28/14 231 lb 12.8 oz (105.144 kg)  08/12/13 232 lb (105.235 kg)  06/27/13 232 lb 12.8 oz  (105.597 kg)     ASSESSMENT AND PLAN:  1. History of remote viral pericarditis with tamponade s/p pericardial window and partial pericardiectomy 2. Post op afib with no reoccurence - continue Metoprolol 3. Elevated DBP - she has been noncompliant with low sodium diet and eats a lot of peanuts.  I have counseled her in a low sodium (,2gm) sodium diet.  Continue Metoprolol 4. Mild LE edema - resolved   5.  Chronic LBBB   Current medicines are reviewed at length with the patient today.  The patient does not have concerns regarding medicines.  The following changes have been made:  no change  Labs/ tests ordered today include: None  No orders of the defined types were placed in this encounter.     Disposition:   FU with me in 1 year   Signed, [name], MD  06/28/2014 3:39 PM    [address], [place], [place]  [address] Phone: [phone]; Fax: [phone]

## 2014-06-28 NOTE — Patient Instructions (Signed)
Your physician recommends that you continue on your current medications as directed. Please refer to the Current Medication list given to you today.  Your physician wants you to follow-up in: 1 year with Dr. Turner. You will receive a reminder letter in the mail two months in advance. If you don't receive a letter, please call our office to schedule the follow-up appointment.  

## 2014-07-12 ENCOUNTER — Ambulatory Visit (INDEPENDENT_AMBULATORY_CARE_PROVIDER_SITE_OTHER): Payer: Medicare Other | Admitting: Physician Assistant

## 2014-07-12 ENCOUNTER — Encounter: Payer: Self-pay | Admitting: Nurse Practitioner

## 2014-07-12 ENCOUNTER — Telehealth: Payer: Self-pay | Admitting: Cardiology

## 2014-07-12 ENCOUNTER — Encounter: Payer: Self-pay | Admitting: Physician Assistant

## 2014-07-12 VITALS — BP 140/86 | HR 73 | Ht 65.5 in | Wt 233.2 lb

## 2014-07-12 DIAGNOSIS — R079 Chest pain, unspecified: Secondary | ICD-10-CM | POA: Insufficient documentation

## 2014-07-12 DIAGNOSIS — R030 Elevated blood-pressure reading, without diagnosis of hypertension: Secondary | ICD-10-CM | POA: Diagnosis not present

## 2014-07-12 DIAGNOSIS — R0789 Other chest pain: Secondary | ICD-10-CM

## 2014-07-12 DIAGNOSIS — IMO0001 Reserved for inherently not codable concepts without codable children: Secondary | ICD-10-CM

## 2014-07-12 NOTE — Assessment & Plan Note (Signed)
This occurred in the setting of high blood pressure while lying down. It sounds like it was a positional thing perhaps musculoskeletal. It resolved and has not recurred. Patient was concerned that she was having a reaccumulation of fluid around her heart. However heart sounds are prominent, no JVD, not hypotensive. She does report cold or respiratory issues since early February. This seems to be resolving. she will follow up with primary care provider if anything worsens
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
## 2014-07-12 NOTE — Patient Instructions (Signed)
Your physician recommends that you continue on your current medications as directed. Please refer to the Current Medication list given to you today.  Follow up as scheduled

## 2014-07-12 NOTE — Telephone Encounter (Signed)
New problem    Pt is having fluid build up on left side, trouble sleeping and requesting an appt today. Please advise pt.

## 2014-07-12 NOTE — Assessment & Plan Note (Signed)
Blood pressure improved after she took her Lopressor. No changes in current therapy.

## 2014-07-12 NOTE — Progress Notes (Signed)
Patient ID: Barbara Nelson, female   DOB: Nov 03, 1940, 74 y.o.   MRN: [id number]     Date:  07/12/2014   ID:  Barbara Nelson, DOB December 07, 1940, MRN [id number]  PCP:  Milagros Evener, MD  Primary Cardiologist:  Radford Pax   Chief Complaint  Patient presents with  . Palpitations    chest pressure     History of Present Illness: Barbara Nelson is a 74 y.o. female with a history of viral pericarditis with tamponade s/p pericardial window and anterior partial pericardiectomy in 2013 with post op atrial fibrillation which she has not had any further problems with. She also has a chronic LBBB. At the time of her surgery she was noted to have a lung nodule which has been followed by Dr. Joya Gaskins and last January increased in size suspicious for malignancy and underwent lung resection at [facility] showing a fungal infection. She is able to walk a few flights of stairs and can walk from one end of the mall to the other without any problems.   Patient reports that last night while in bed she was having some discomfort under her left breast. She seems to think that she was in atrial fibrillation so she usually can tell when she is.  She was more concerned that she was redeveloping fluid around her heart. She says the pain was about 7 out of 10 in intensity but has resolved. She is usually able to walk up a flight of stairs without any problems but on occasion does have some dyspnea her blood pressure when she checked it last night was in the 412I systolic and after she took her Lopressor this morning it was in the 140s which is what it is today in the office.  The patient currently denies nausea, vomiting, fever, chest pain, orthopnea, dizziness, PND, cough, congestion, abdominal pain, hematochezia, melena, lower extremity edema, claudication.  Wt Readings from Last 3 Encounters:  07/12/14 233 lb 3.2 oz (105.779 kg)  06/28/14 231 lb 12.8 oz (105.144 kg)  08/12/13 232 lb (105.235 kg)     Past Medical  History  Diagnosis Date  . Cardiac tamponade due to viral pericarditis   . A-fib   . Pulmonary nodule   . Pericarditis, viral   . Postoperative atrial fibrillation   . Lung mass     fungal infection s/p resection at [facility]    Current Outpatient Prescriptions  Medication Sig Dispense Refill  . Glycerin-Polysorbate 80 (REFRESH DRY EYE THERAPY OP) Place 1-2 drops into both eyes daily.     Marland Kitchen ibuprofen (ADVIL,MOTRIN) 400 MG tablet Take 400 mg by mouth every 6 (six) hours as needed.    . metoprolol tartrate (LOPRESSOR) 25 MG tablet Take 0.5 tablets (12.5 mg total) by mouth 2 (two) times daily. 90 tablet 10   No current facility-administered medications for this visit.    Allergies:    Allergies  Allergen Reactions  . Tape Hives  . Z-Pak [Azithromycin] Other (See Comments)    Makes mouth sore.  . Amoxicillin Rash  . Latex Rash    Social History:  The patient  reports that she quit smoking about 35 years ago. Her smoking use included Cigarettes. She has a 15 pack-year smoking history. She has never used smokeless tobacco. She reports that she does not drink alcohol or use illicit drugs.   Family history:   Family History  Problem Relation Age of Onset  . Hypertension Mother   . Ovarian cancer Mother   . Hypertension  Father   . Stroke Father   . Prostate cancer Brother   . Thyroid disease Sister   . Hypertension Sister   . Leukemia Maternal Aunt     ROS:  Please see the history of present illness.  All other systems reviewed and negative.   PHYSICAL EXAM: VS:  BP 140/86 mmHg  Pulse 73  Ht 5' 5.5" (1.664 m)  Wt 233 lb 3.2 oz (105.779 kg)  BMI 38.20 kg/m2 Obese, well developed, in no acute distress HEENT: Pupils are equal round react to light accommodation extraocular movements are intact.  Neck: no JVDNo cervical lymphadenopathy. Cardiac: Prominent heart sounds. Regular rate and rhythm without murmurs rubs or gallops. Lungs:  She is some mild rhonchi left upper lobe  otherwise clear, no wheezing or rales. No E to a transition Abd: soft, nontender, positive bowel sounds all quadrants, no hepatosplenomegaly musculoskeletal: The area of her ribs underneath her breast is nontender. Ext: 1+ right lower extremity edema and none in the left.  2+ radial and dorsalis pedis pulses. Skin: warm and dry Neuro:  Grossly normal  EKG:  Left bundle branch block rate 73 bpm     ASSESSMENT AND PLAN:  Problem List Items Addressed This Visit    Elevated blood pressure    Blood pressure improved after she took her Lopressor. No changes in current therapy.      Chest pain - Primary    This occurred in the setting of high blood pressure while lying down. It sounds like it was a positional thing perhaps musculoskeletal. It resolved and has not recurred. Patient was concerned that she was having a reaccumulation of fluid around her heart. However heart sounds are prominent, no JVD, not hypotensive. She does report cold or respiratory issues since early February. This seems to be resolving. she will follow up with primary care provider if anything worsens      Relevant Orders   EKG 12-Lead

## 2014-07-16 DIAGNOSIS — R05 Cough: Secondary | ICD-10-CM | POA: Diagnosis not present

## 2014-08-21 DIAGNOSIS — M25572 Pain in left ankle and joints of left foot: Secondary | ICD-10-CM | POA: Diagnosis not present

## 2015-05-22 DIAGNOSIS — R51 Headache: Secondary | ICD-10-CM | POA: Diagnosis not present

## 2015-07-10 DIAGNOSIS — I8390 Asymptomatic varicose veins of unspecified lower extremity: Secondary | ICD-10-CM | POA: Diagnosis not present

## 2015-07-19 ENCOUNTER — Other Ambulatory Visit: Payer: Self-pay | Admitting: Cardiology

## 2015-08-02 DIAGNOSIS — R05 Cough: Secondary | ICD-10-CM | POA: Diagnosis not present

## 2015-08-02 DIAGNOSIS — T7589XA Other specified effects of external causes, initial encounter: Secondary | ICD-10-CM | POA: Diagnosis not present

## 2015-08-05 ENCOUNTER — Emergency Department (HOSPITAL_BASED_OUTPATIENT_CLINIC_OR_DEPARTMENT_OTHER): Payer: Medicare Other

## 2015-08-05 ENCOUNTER — Encounter (HOSPITAL_BASED_OUTPATIENT_CLINIC_OR_DEPARTMENT_OTHER): Payer: Self-pay | Admitting: Emergency Medicine

## 2015-08-05 ENCOUNTER — Emergency Department (HOSPITAL_BASED_OUTPATIENT_CLINIC_OR_DEPARTMENT_OTHER)
Admission: EM | Admit: 2015-08-05 | Discharge: 2015-08-05 | Disposition: A | Discharge status: Home / Self Care | Payer: Medicare Other | Attending: Emergency Medicine | Admitting: Emergency Medicine

## 2015-08-05 DIAGNOSIS — R918 Other nonspecific abnormal finding of lung field: Secondary | ICD-10-CM | POA: Insufficient documentation

## 2015-08-05 DIAGNOSIS — Z91040 Latex allergy status: Secondary | ICD-10-CM | POA: Diagnosis not present

## 2015-08-05 DIAGNOSIS — Z880 Allergy status to penicillin: Secondary | ICD-10-CM | POA: Diagnosis not present

## 2015-08-05 DIAGNOSIS — Z87891 Personal history of nicotine dependence: Secondary | ICD-10-CM | POA: Insufficient documentation

## 2015-08-05 DIAGNOSIS — J209 Acute bronchitis, unspecified: Secondary | ICD-10-CM | POA: Diagnosis not present

## 2015-08-05 DIAGNOSIS — Z79899 Other long term (current) drug therapy: Secondary | ICD-10-CM | POA: Insufficient documentation

## 2015-08-05 DIAGNOSIS — J40 Bronchitis, not specified as acute or chronic: Secondary | ICD-10-CM

## 2015-08-05 DIAGNOSIS — R05 Cough: Secondary | ICD-10-CM | POA: Diagnosis present

## 2015-08-05 DIAGNOSIS — Z9889 Other specified postprocedural states: Secondary | ICD-10-CM | POA: Insufficient documentation

## 2015-08-05 DIAGNOSIS — R9389 Abnormal findings on diagnostic imaging of other specified body structures: Secondary | ICD-10-CM

## 2015-08-05 DIAGNOSIS — R931 Abnormal findings on diagnostic imaging of heart and coronary circulation: Secondary | ICD-10-CM | POA: Diagnosis not present

## 2015-08-05 DIAGNOSIS — Z8619 Personal history of other infectious and parasitic diseases: Secondary | ICD-10-CM | POA: Insufficient documentation

## 2015-08-05 DIAGNOSIS — I4891 Unspecified atrial fibrillation: Secondary | ICD-10-CM | POA: Diagnosis not present

## 2015-08-05 DIAGNOSIS — Z902 Acquired absence of lung [part of]: Secondary | ICD-10-CM | POA: Insufficient documentation

## 2015-08-05 MED ORDER — AZITHROMYCIN 250 MG PO TABS: 250.0000 mg | ORAL_TABLET | Freq: Every day | ORAL | Status: DC

## 2015-08-05 MED FILL — AZITHROMYCIN 250 MG TABLET: 250 | 5 days supply | Qty: 6 | Fill #0

## 2015-08-05 NOTE — ED Provider Notes (Signed)
CSN: [id number]     Arrival date & time 08/05/15  1147 History  By signing my name below, I, Barbara Nelson, attest that this documentation has been prepared under the direction and in the presence of Barbara Schwartz, MD. Electronically Signed: Helane Nelson, ED Scribe. 08/05/2015. 3:27 PM.       Chief Complaint  Patient presents with  . Cough   The history is provided by the patient. No language interpreter was used.   HPI Comments: Barbara Nelson is a 75 y.o. female former smoker with a PMHx of pulmonary nodule, lung mass, A-fib, and cardiac tamponade due to viral pericarditis and a PSHx of right lower lobectomy (3 years ago at [facility]) who presents to the Emergency Department complaining of productive (yellow sputum) cough onset 5 days ago. Pt states she was seen for this by primary care 3 days ago, when she was given an albuterol inhaler. However, since then her symptoms have worsened, and the cough became productive, which is why she came to the ED. She reports associated wheezing, hot flashes, cold chills, and loss of appetite. Pt states she inhaled some Clorox 6 days ago, which triggered the coughing and wheezing. She states she has an albuterol  inhaler at home, but she has not used it for this yet, as it makes her jittery. She notes that her lobectomy revealed she had a fungal infection in the lung. She notes she has not recently been seen by the surgeon. She states her PCP is [name]. Pt denies fever.  Pt is allergic to Z-pak (rash), amoxicillin, tape, and latex. She notes she is also allergic to penicillins.   Past Medical History  Diagnosis Date  . Cardiac tamponade due to viral pericarditis   . A-fib ([place])   . Pulmonary nodule   . Pericarditis, viral   . Postoperative atrial fibrillation ([place])   . Lung mass     fungal infection s/p resection at [facility]   Past Surgical History  Procedure Laterality Date  . Chest tube placement  09/03/11  . Subxiphoid pericardial window and  drainage anterior partial pericardiectomy  09/03/11  . Right knee arthoscopy with meniscal repair    . Abdominal hysterectomy  1988  . Lung removal, partial     Family History  Problem Relation Age of Onset  . Hypertension Mother   . Ovarian cancer Mother   . Hypertension Father   . Stroke Father   . Prostate cancer Brother   . Thyroid disease Sister   . Hypertension Sister   . Leukemia Maternal Aunt    Social History  Substance Use Topics  . Smoking status: Former Smoker -- 1.50 packs/day for 10 years    Types: Cigarettes    Quit date: 05/19/1979  . Smokeless tobacco: Never Used  . Alcohol Use: No   OB History    No data available     Review of Systems  Constitutional: Positive for chills and appetite change. Negative for fever.  Respiratory: Positive for cough and wheezing.   All other systems reviewed and are negative.   Allergies  Tape; Z-pak; Amoxicillin; and Latex  Home Medications   Prior to Admission medications   Medication Sig Start Date End Date Taking? Authorizing Provider  azithromycin (ZITHROMAX) 250 MG tablet Take 1 tablet (250 mg total) by mouth daily. Take first 2 tablets together, then 1 every day until finished. 08/05/15   Barbara Schwartz, MD  Glycerin-Polysorbate 80 (REFRESH DRY EYE THERAPY OP) Place 1-2 drops into  both eyes daily.     Historical Provider, MD  ibuprofen (ADVIL,MOTRIN) 400 MG tablet Take 400 mg by mouth every 6 (six) hours as needed.    Historical Provider, MD  metoprolol tartrate (LOPRESSOR) 25 MG tablet TAKE 1/2 TABLET BY MOUTH TWICE DAILY 07/19/15   [name], MD   BP 146/84 mmHg  Pulse 70  Temp(Src) 98.9 F (37.2 C) (Oral)  Resp 16  Ht '5\' 6"'$  (1.676 m)  Wt 240 lb (108.863 kg)  BMI 38.76 kg/m2  SpO2 98% Physical Exam Physical Exam  Nursing note and vitals reviewed. Constitutional: She is oriented to person, place, and time. She appears well-developed and well-nourished. No distress.  HENT:  Head: Normocephalic and  atraumatic.  Eyes: Pupils are equal, round, and reactive to light.  Neck: Normal range of motion.  Cardiovascular: Normal rate and intact distal pulses.   Pulmonary/Chest: No respiratory distress.  Very minor expiratory wheezes noted to auscultation at bases.   Abdominal: Normal appearance. She exhibits no distension.  Musculoskeletal: Normal range of motion.  Neurological: She is alert and oriented to person, place, and time. No cranial nerve deficit.  Skin: Skin is warm and dry. No rash noted.  Psychiatric: She has a normal mood and affect. Her behavior is normal.   ED Course  Procedures  DIAGNOSTIC STUDIES: Oxygen Saturation is 98% on RA, normal by my interpretation.    COORDINATION OF CARE: 3:25 PM - Discussed plans to order a Z-pak. Advised to f/u with PCP about the XR results and possibly consult with [facility]. Pt advised of plan for treatment and pt agrees.  Labs Review Labs Reviewed - No data to display  Imaging Review Dg Chest 2 View  08/05/2015  CLINICAL DATA:  Cough and congestion for 3 days EXAM: CHEST  2 VIEW COMPARISON:  08/12/2013 and 05/31/2012 FINDINGS: Cardiomediastinal silhouette is stable. No infiltrate or pulmonary edema. Again noted nodular lesion in right middle lobe peripheral measures about 3.2 cm length by 1.1 cm thickness. Mild progression in size from prior exam. Further correlation with CT scan of the chest is recommended. IMPRESSION: No infiltrate or pulmonary edema. Again noted nodular lesion in right middle lobe peripheral measures about 3.2 cm length by 1.1 cm thickness. Mild progression in size from prior exam. Further correlation with CT scan of the chest is recommended. Electronically Signed   By: Lahoma Crocker M.D.   On: 08/05/2015 13:43   I have personally reviewed and evaluated these images and lab results as part of my medical decision-making.   EKG Interpretation None     I discussed the abnormal chest x-ray with the patient.  She wishes to follow-up  with her primary care doctor or the doctors at [facility] or both for resolution of radiographic abnormality. MDM   Final diagnoses:  Bronchitis  Abnormal chest x-ray   I personally performed the services described in this documentation, which was scribed in my presence. The recorded information has been reviewed and considered.   Barbara Schwartz, MD 08/05/15 1537

## 2015-08-05 NOTE — ED Notes (Signed)
Patient states that she is having a cough and loss of appetite x several days. Reports that it worsened on Friday.

## 2015-08-05 NOTE — Discharge Instructions (Signed)
You had an abnormal chest x-ray that needs following up by your primary care doctor or by your doctor at [facility]  Upper Respiratory Infection, Adult Most upper respiratory infections (URIs) are caused by a virus. A URI affects the nose, throat, and upper air passages. The most common type of URI is often called "the common cold." HOME CARE   Take medicines only as told by your doctor.  Gargle warm saltwater or take cough drops to comfort your throat as told by your doctor.  Use a warm mist humidifier or inhale steam from a shower to increase air moisture. This may make it easier to breathe.  Drink enough fluid to keep your pee (urine) clear or pale yellow.  Eat soups and other clear broths.  Have a healthy diet.  Rest as needed.  Go back to work when your fever is gone or your doctor says it is okay.  You may need to stay home longer to avoid giving your URI to others.  You can also wear a face mask and wash your hands often to prevent spread of the virus.  Use your inhaler more if you have asthma.  Do not use any tobacco products, including cigarettes, chewing tobacco, or electronic cigarettes. If you need help quitting, ask your doctor. GET HELP IF:  You are getting worse, not better.  Your symptoms are not helped by medicine.  You have chills.  You are getting more short of breath.  You have brown or red mucus.  You have yellow or brown discharge from your nose.  You have pain in your face, especially when you bend forward.  You have a fever.  You have puffy (swollen) neck glands.  You have pain while swallowing.  You have white areas in the back of your throat. GET HELP RIGHT AWAY IF:   You have very bad or constant:  Headache.  Ear pain.  Pain in your forehead, behind your eyes, and over your cheekbones (sinus pain).  Chest pain.  You have long-lasting (chronic) lung disease and any of the following:  Wheezing.  Long-lasting cough.  Coughing up  blood.  A change in your usual mucus.  You have a stiff neck.  You have changes in your:  Vision.  Hearing.  Thinking.  Mood. MAKE SURE YOU:   Understand these instructions.  Will watch your condition.  Will get help right away if you are not doing well or get worse.   This information is not intended to replace advice given to you by your health care provider. Make sure you discuss any questions you have with your health care provider.   Document Released: 10/21/2007 Document Revised: 09/18/2014 Document Reviewed: 08/09/2013 Elsevier Interactive Patient Education [employer].

## 2015-08-13 DIAGNOSIS — R911 Solitary pulmonary nodule: Secondary | ICD-10-CM | POA: Diagnosis not present

## 2015-08-13 DIAGNOSIS — Z23 Encounter for immunization: Secondary | ICD-10-CM | POA: Diagnosis not present

## 2015-08-13 DIAGNOSIS — J209 Acute bronchitis, unspecified: Secondary | ICD-10-CM | POA: Diagnosis not present

## 2015-08-14 ENCOUNTER — Other Ambulatory Visit: Payer: Self-pay | Admitting: Family Medicine

## 2015-08-14 DIAGNOSIS — R911 Solitary pulmonary nodule: Secondary | ICD-10-CM

## 2015-09-13 ENCOUNTER — Ambulatory Visit
Admission: RE | Admit: 2015-09-13 | Discharge: 2015-09-13 | Disposition: A | Discharge status: Home / Self Care | Payer: Medicare Other | Source: Ambulatory Visit | Attending: Family Medicine | Admitting: Family Medicine

## 2015-09-13 DIAGNOSIS — R911 Solitary pulmonary nodule: Secondary | ICD-10-CM

## 2015-09-13 DIAGNOSIS — R918 Other nonspecific abnormal finding of lung field: Secondary | ICD-10-CM | POA: Diagnosis not present

## 2015-09-13 MED ORDER — IOPAMIDOL (ISOVUE-300) INJECTION 61%
75.0000 mL | Freq: Once | INTRAVENOUS | Status: AC | PRN
  Administered 2015-09-13: 75 mL via INTRAVENOUS

## 2015-10-15 ENCOUNTER — Other Ambulatory Visit: Payer: Self-pay | Admitting: Cardiology

## 2015-10-31 ENCOUNTER — Ambulatory Visit (INDEPENDENT_AMBULATORY_CARE_PROVIDER_SITE_OTHER): Payer: Medicare Other

## 2015-10-31 ENCOUNTER — Ambulatory Visit (INDEPENDENT_AMBULATORY_CARE_PROVIDER_SITE_OTHER): Payer: Medicare Other | Admitting: Cardiology

## 2015-10-31 ENCOUNTER — Encounter: Payer: Self-pay | Admitting: Cardiology

## 2015-10-31 VITALS — BP 144/88 | HR 79 | Ht 66.0 in | Wt 236.1 lb

## 2015-10-31 DIAGNOSIS — I10 Essential (primary) hypertension: Secondary | ICD-10-CM

## 2015-10-31 DIAGNOSIS — R609 Edema, unspecified: Secondary | ICD-10-CM | POA: Diagnosis not present

## 2015-10-31 DIAGNOSIS — R600 Localized edema: Secondary | ICD-10-CM

## 2015-10-31 DIAGNOSIS — R002 Palpitations: Secondary | ICD-10-CM

## 2015-10-31 DIAGNOSIS — I319 Disease of pericardium, unspecified: Secondary | ICD-10-CM

## 2015-10-31 DIAGNOSIS — I313 Pericardial effusion (noninflammatory): Secondary | ICD-10-CM

## 2015-10-31 DIAGNOSIS — I3139 Other pericardial effusion (noninflammatory): Secondary | ICD-10-CM

## 2015-10-31 HISTORY — DX: Essential (primary) hypertension: I10

## 2015-10-31 MED ORDER — METOPROLOL TARTRATE 25 MG PO TABS: 25.0000 mg | ORAL_TABLET | Freq: Two times a day (BID) | ORAL | Status: DC

## 2015-10-31 MED ORDER — METOPROLOL TARTRATE 25 MG PO TABS: 12.5000 mg | ORAL_TABLET | Freq: Two times a day (BID) | ORAL | Status: DC

## 2015-10-31 NOTE — Progress Notes (Signed)
Cardiology Office Note    Date:  10/31/2015   ID:  Barbara Nelson, DOB Jan 25, 1941, MRN [id number]  PCP:  Aretta Nip, MD  Cardiologist:  Fransico Him, MD   Chief Complaint  Patient presents with  . Follow-up    pericardia effusion/HTN    History of Present Illness:  Barbara Nelson is a 75 y.o. female with a history of viral pericarditis with tamponade s/p pericardial window and anterior partial pericardiectomy in 2013 with post op atrial fibrillation which she has not had any further problems with. She also has a chronic LBBB. She denies any chest pain or pressure.  Occasionally she will have some very mild SOB when going up the stairs but is able to exercises at the gym riding a bike without any problems. She occasionally has some dizziness.  She denies any syncope. She denies any LE edema.  She has noticed some fluttering of her chest a few seconds and then resolves.  .  Past Medical History  Diagnosis Date  . Cardiac tamponade due to viral pericarditis   . Postoperative atrial fibrillation ([place])   . Lung mass     fungal infection s/p resection at [facility]  . Benign essential HTN 10/31/2015  . Pulmonary nodule     Past Surgical History  Procedure Laterality Date  . Chest tube placement  09/03/11  . Subxiphoid pericardial window and drainage anterior partial pericardiectomy  09/03/11  . Right knee arthoscopy with meniscal repair    . Abdominal hysterectomy  1988  . Lung removal, partial      Current Medications: Outpatient Prescriptions Prior to Visit  Medication Sig Dispense Refill  . ibuprofen (ADVIL,MOTRIN) 400 MG tablet Take 400 mg by mouth every 6 (six) hours as needed.    . metoprolol tartrate (LOPRESSOR) 25 MG tablet TAKE 1/2 TABLET BY MOUTH TWICE DAILY 90 tablet 0  . azithromycin (ZITHROMAX) 250 MG tablet Take 1 tablet (250 mg total) by mouth daily. Take first 2 tablets together, then 1 every day until finished. (Patient not taking: Reported on  10/31/2015) 6 tablet 0  . Glycerin-Polysorbate 80 (REFRESH DRY EYE THERAPY OP) Place 1-2 drops into both eyes daily. Reported on 10/31/2015    . metoprolol tartrate (LOPRESSOR) 25 MG tablet TAKE 1/2 TABLET BY MOUTH TWICE DAILY (Patient not taking: Reported on 10/31/2015) 90 tablet 3   No facility-administered medications prior to visit.     Allergies:   Latex; Penicillins; Tape; Z-pak; and Amoxicillin   Social History   Social History  . Marital Status: Single    Spouse Name: N/A  . Number of Children: 4  . Years of Education: N/A   Occupational History  . unemployed Therapist, sports retired    Social History Main Topics  . Smoking status: Former Smoker -- 1.50 packs/day for 10 years    Types: Cigarettes    Quit date: 05/19/1979  . Smokeless tobacco: Never Used  . Alcohol Use: No  . Drug Use: No  . Sexual Activity: Not Asked   Other Topics Concern  . None   Social History Narrative     Family History:  The patient's family history includes Hypertension in her father, mother, and sister; Leukemia in her maternal aunt; Ovarian cancer in her mother; Prostate cancer in her brother; Stroke in her father; Thyroid disease in her sister.   ROS:   Please see the history of present illness.    ROS All other systems reviewed and are negative.   PHYSICAL  EXAM:   VS:  BP 144/88 mmHg  Pulse 79  Ht '5\' 6"'$  (1.676 m)  Wt 236 lb 1.9 oz (107.103 kg)  BMI 38.13 kg/m2   GEN: Well nourished, well developed, in no acute distress HEENT: normal Neck: no JVD, carotid bruits, or masses Cardiac: RRR; no murmurs, rubs, or gallops,no edema.  Intact distal pulses bilaterally.  Respiratory:  clear to auscultation bilaterally, normal work of breathing GI: soft, nontender, nondistended, + BS MS: no deformity or atrophy Skin: warm and dry, no rash Neuro:  Alert and Oriented x 3, Strength and sensation are intact Psych: euthymic mood, full affect  Wt Readings from Last 3 Encounters:  10/31/15 236 lb 1.9 oz  (107.103 kg)  08/05/15 240 lb (108.863 kg)  07/12/14 233 lb 3.2 oz (105.779 kg)      Studies/Labs Reviewed:   EKG:  EKG is  ordered today and shows NSR with nonspecific IVCD with lateral infarct no change in EKG from 2015  Recent Labs: No results found for requested labs within last 365 days.   Lipid Panel No results found for: CHOL, TRIG, HDL, CHOLHDL, VLDL, LDLCALC, LDLDIRECT  Additional studies/ records that were reviewed today include:  none    ASSESSMENT:    1. Pericardial effusion/ pericarditis   2. Benign essential HTN   3. Edema extremities   4. Heart palpitations      PLAN:  In order of problems listed above:  1.   Pericardial effusion/pericarditis s/p pericardial window remotely with no reoccurence. She has not had any chest pain. She is doing well  2.   HTN - BP borderline controlled on current medical regimen but says that she gets dizzy with higher doses of BB.  I have asked her to check her BP daily for a week and call with results. 3.   LE edema - resolved 4.  Palpitations that she describes as a fluttering.  She has a remote history of PAF at the time of her pericarditis and window but no documented PAF since then.  I will get a heart monitor to assess for PAF.      Medication Adjustments/Labs and Tests Ordered: Current medicines are reviewed at length with the patient today.  Concerns regarding medicines are outlined above.  Medication changes, Labs and Tests ordered today are listed in the Patient Instructions below.  Patient Instructions  Medication Instructions:  Your physician recommends that you continue on your current medications as directed. Please refer to the Current Medication list given to you today.   Labwork: None  Testing/Procedures: Your physician has recommended that you wear an event monitor for 2 weeks. Event monitors are medical devices that record the heart's electrical activity. Doctors most often [place] these monitors to diagnose  arrhythmias. Arrhythmias are problems with the speed or rhythm of the heartbeat. The monitor is a small, portable device. You can wear one while you do your normal daily activities. This is usually used to diagnose what is causing palpitations/syncope (passing out).  Follow-Up: Your physician wants you to follow-up in: 6 months with Dr. Radford Pax. You will receive a reminder letter in the mail two months in advance. If you don't receive a letter, please call our office to schedule the follow-up appointment.   Any Other Special Instructions Will Be Listed Below (If Applicable).     If you need a refill on your cardiac medications before your next appointment, please call your pharmacy.       Signed, Fransico Him, MD  10/31/2015 11:05 AM    [address], [place], [place]  [address] Phone: [phone]; Fax: [phone]

## 2015-10-31 NOTE — Patient Instructions (Addendum)
Medication Instructions:  Your physician recommends that you continue on your current medications as directed. Please refer to the Current Medication list given to you today.   Labwork: None  Testing/Procedures: Your physician has recommended that you wear an event monitor for 2 weeks. Event monitors are medical devices that record the heart's electrical activity. Doctors most often [place] these monitors to diagnose arrhythmias. Arrhythmias are problems with the speed or rhythm of the heartbeat. The monitor is a small, portable device. You can wear one while you do your normal daily activities. This is usually used to diagnose what is causing palpitations/syncope (passing out).  Follow-Up: Your physician wants you to follow-up in: 6 months with Dr. Radford Pax. You will receive a reminder letter in the mail two months in advance. If you don't receive a letter, please call our office to schedule the follow-up appointment.   Any Other Special Instructions Will Be Listed Below (If Applicable).     If you need a refill on your cardiac medications before your next appointment, please call your pharmacy.

## 2015-11-12 ENCOUNTER — Telehealth: Payer: Self-pay | Admitting: Cardiology

## 2015-11-12 NOTE — Telephone Encounter (Signed)
BP readings are stable - no change in meds at this time

## 2015-11-12 NOTE — Telephone Encounter (Signed)
New message    pt is calling for rn  To give bp readings  6-19  140/80  142/89  131/79  118/71  125/72

## 2015-11-12 NOTE — Telephone Encounter (Signed)
Instructed patient to continue current medications as BP readings are stable. She was grateful for call.

## 2015-11-12 NOTE — Telephone Encounter (Signed)
BP readings to Dr. Radford Pax for review.

## 2016-01-30 DIAGNOSIS — J22 Unspecified acute lower respiratory infection: Secondary | ICD-10-CM | POA: Diagnosis not present

## 2016-01-30 DIAGNOSIS — R05 Cough: Secondary | ICD-10-CM | POA: Diagnosis not present

## 2016-01-30 DIAGNOSIS — J984 Other disorders of lung: Secondary | ICD-10-CM | POA: Diagnosis not present

## 2016-01-30 DIAGNOSIS — R918 Other nonspecific abnormal finding of lung field: Secondary | ICD-10-CM | POA: Diagnosis not present

## 2016-06-25 ENCOUNTER — Encounter: Payer: Self-pay | Admitting: Cardiology

## 2016-06-29 ENCOUNTER — Ambulatory Visit: Payer: Medicare Other | Admitting: Cardiology

## 2016-07-13 DIAGNOSIS — J209 Acute bronchitis, unspecified: Secondary | ICD-10-CM | POA: Diagnosis not present

## 2016-07-13 DIAGNOSIS — H04123 Dry eye syndrome of bilateral lacrimal glands: Secondary | ICD-10-CM | POA: Diagnosis not present

## 2016-07-29 ENCOUNTER — Ambulatory Visit (INDEPENDENT_AMBULATORY_CARE_PROVIDER_SITE_OTHER): Payer: Medicare Other | Admitting: Cardiology

## 2016-07-29 ENCOUNTER — Encounter: Payer: Self-pay | Admitting: Cardiology

## 2016-07-29 ENCOUNTER — Encounter (INDEPENDENT_AMBULATORY_CARE_PROVIDER_SITE_OTHER): Payer: Self-pay

## 2016-07-29 VITALS — BP 150/84 | HR 82 | Ht 65.5 in | Wt 240.1 lb

## 2016-07-29 DIAGNOSIS — I10 Essential (primary) hypertension: Secondary | ICD-10-CM

## 2016-07-29 NOTE — Progress Notes (Signed)
Cardiology Office Note    Date:  07/30/2016   ID:  Barbara Nelson, DOB 1941-01-10, MRN [id number]  PCP:  Aretta Nip, MD  Cardiologist:  Fransico Him, MD   Chief Complaint  Patient presents with  . recall    History of Present Illness:  Barbara Nelson is a 76 y.o. female with a history of viral pericarditis with tamponade s/p pericardial window and anterior partial pericardiectomy in 2013 with post op atrial fibrillation which she has not had any further problems with. She also has a chronic LBBB. She denies any chest pain or pressure. She denies any SOB up until she got a chest cold. She occasionally has some dizziness but has not had any syncope. She denies any significant LE edema unless she eats too much salt in her diet.  She has noticed some fluttering of her chest a few seconds and then resolves and has only had it once she she saw me last and only lasts a few seconds.     Past Medical History:  Diagnosis Date  . Benign essential HTN 10/31/2015  . Cardiac tamponade due to viral pericarditis   . Lung mass    fungal infection s/p resection at [facility]  . Postoperative atrial fibrillation ([place])   . Pulmonary nodule     Past Surgical History:  Procedure Laterality Date  . ABDOMINAL HYSTERECTOMY  1988  . chest tube placement  09/03/11  . LUNG REMOVAL, PARTIAL    . right knee arthoscopy with meniscal repair    . subxiphoid pericardial window and drainage anterior partial pericardiectomy  09/03/11    Current Medications: Current Meds  Medication Sig  . ibuprofen (ADVIL,MOTRIN) 400 MG tablet Take 400 mg by mouth every 6 (six) hours as needed.  . metoprolol tartrate (LOPRESSOR) 25 MG tablet Take 0.5 tablets (12.5 mg total) by mouth 2 (two) times daily.    Allergies:   Latex; Penicillins; Tape; Z-pak [azithromycin]; and Amoxicillin   Social History   Social History  . Marital status: Single    Spouse name: N/A  . Number of children: 4  . Years of  education: N/A   Occupational History  . unemployed Therapist, sports retired    Social History Main Topics  . Smoking status: Former Smoker    Packs/day: 1.50    Years: 10.00    Types: Cigarettes    Quit date: 05/19/1979  . Smokeless tobacco: Never Used  . Alcohol use No  . Drug use: No  . Sexual activity: Not Asked   Other Topics Concern  . None   Social History Narrative  . None     Family History:  The patient's family history includes Hypertension in her father, mother, and sister; Leukemia in her maternal aunt; Ovarian cancer in her mother; Prostate cancer in her brother; Stroke in her father; Thyroid disease in her sister.   ROS:   Please see the history of present illness.    ROS All other systems reviewed and are negative.  No flowsheet data found.     PHYSICAL EXAM:   VS:  BP (!) 150/84   Pulse 82   Ht 5' 5.5" (1.664 m)   Wt 240 lb 1.9 oz (108.9 kg)   SpO2 98%   BMI 39.35 kg/m    GEN: Well nourished, well developed, in no acute distress  HEENT: normal  Neck: no JVD, carotid bruits, or masses Cardiac: RRR; no murmurs, rubs, or gallops,no edema.  Intact distal pulses bilaterally.  Respiratory:  clear to auscultation bilaterally, normal work of breathing GI: soft, nontender, nondistended, + BS MS: no deformity or atrophy  Skin: warm and dry, no rash Neuro:  Alert and Oriented x 3, Strength and sensation are intact Psych: euthymic mood, full affect  Wt Readings from Last 3 Encounters:  07/29/16 240 lb 1.9 oz (108.9 kg)  10/31/15 236 lb 1.9 oz (107.1 kg)  08/05/15 240 lb (108.9 kg)      Studies/Labs Reviewed:   EKG:  EKG is ordered today.  The ekg ordered today demonstrates NSR with nonspecific IVCD with anterior infarct  Recent Labs: No results found for requested labs within last 8760 hours.   Lipid Panel No results found for: CHOL, TRIG, HDL, CHOLHDL, VLDL, LDLCALC, LDLDIRECT  Additional studies/ records that were reviewed today include:   none    ASSESSMENT:    1. Benign essential HTN      PLAN:  In order of problems listed above:  1.   H/O Pericardial effusion/pericarditis s/p pericardial window remotely in 2013 with no reoccurence. She has continued to do well with no reoccurrence of CP.  She is no longer on colchicine.  2.   HTN - BP is borderline controlled on current medical regimen but she says that she gets dizzy with higher doses of BB so we will not titrate her BB today.  I have asked her to check her BP daily for a week and call with results. She likely has a component of white coat HTN.    3.   LE edema - this only occurs when she has too much sodium in her diet.  4.  Palpitations that she describes as a fluttering.  She has a remote history of PAF at the time of her pericarditis and pericardial window but no documented PAF since then.  She had this the last time I saw her and a heart monitor showed no arrhythmias.  She says that the palpitations have settled down and are very rare in occurrence. She will continue on BB.  Medication Adjustments/Labs and Tests Ordered: Current medicines are reviewed at length with the patient today.  Concerns regarding medicines are outlined above.  Medication changes, Labs and Tests ordered today are listed in the Patient Instructions below.  Patient Instructions  Medication Instructions:  Your physician recommends that you continue on your current medications as directed. Please refer to the Current Medication list given to you today.   Labwork: None  Testing/Procedures: None  Follow-Up: Your physician wants you to follow-up in: 6 months with Dr. Radford Pax. You will receive a reminder letter in the mail two months in advance. If you don't receive a letter, please call our office to schedule the follow-up appointment.   Any Other Special Instructions Will Be Listed Below (If Applicable).     If you need a refill on your cardiac medications before your next  appointment, please call your pharmacy.      Signed, Fransico Him, MD  07/30/2016 8:20 PM    [facility] [place], [place], [place]  [address] Phone: [phone]; Fax: [phone]

## 2016-07-29 NOTE — Patient Instructions (Signed)

## 2016-10-14 ENCOUNTER — Other Ambulatory Visit: Payer: Self-pay | Admitting: Cardiology

## 2016-10-27 DIAGNOSIS — R002 Palpitations: Secondary | ICD-10-CM | POA: Diagnosis not present

## 2016-11-01 NOTE — Progress Notes (Signed)
Cardiology Office Note    Date:  11/02/2016  ID:  Barbara Nelson, DOB 09/17/1940, MRN [id number] PCP:  Aretta Nip, MD  Cardiologist:  Dr. Radford Pax   Chief Complaint: irregular heartbeat  History of Present Illness:  Barbara Nelson is a 76 y.o. female with history of viral pericarditis with tamponade s/p pericardial window and anterior partial pericardiectomy in 2013 with post op atrial fibrillation, LBBB, HTN, pulmonary nodule who presents for evaluation of irregular heartbeat and pre-operative asssessment for cataracts. She also has a history of lower extremity edema when increasing sodium in diet. Cardiac event monitor 10/2015 for recurrent palpitations showed NSR. Last labs 2013 showed glucose 127, K 3.7, Cr 1.1, WBC 11.1, Hgb 14.6, Plt 177. Last echo in 02/2012 showed EF 64.5%, mild TR.  She presents back at the request of her PCP's office for evaluation of palpitations - per PCP note, "since then has had spells where her heart rate is 'rapid' and felt 'weak' and had to sit down." The patient's history giving is somewhat inconsistent at times. Initially she said she was having several spells but then decreased her caffeine and is now feeling better. Then she clarified that she's actually only had one episode this past Saturday while walking where her HR jumped to the 150s, associated with feeling "jittery." She sat down to rest and her HR abruptly came down to the 60s. I then asked her to clarify the number of episodes and the patient said it only happened only once, but daughter jumped into the conversation and reminded her mother this has occurred multiple times. She is worried the patient tries to downplay symptoms. The patient then admitted that she has noticed increased palpitations since son died unexpectedly in 05-Sep-2022. She also has occasional dizziness, most often when bending over, only lasts a few seconds, resolves upon uprighting herself. No h/o TIA, stroke or bleeding. She  also reports mild dyspnea at higher level of exertion which is not new for her. No CP. Her daughter wants her to get a handicapped sticker but the patient does not want one, citing she's fine to park wherever needed.   Past Medical History:  Diagnosis Date  . Benign essential HTN 10/31/2015  . Cardiac tamponade due to viral pericarditis   . Lung mass    fungal infection s/p resection at [facility]  . Postoperative atrial fibrillation ([place])   . Pulmonary nodule     Past Surgical History:  Procedure Laterality Date  . ABDOMINAL HYSTERECTOMY  1988  . chest tube placement  09/03/11  . LUNG REMOVAL, PARTIAL    . right knee arthoscopy with meniscal repair    . subxiphoid pericardial window and drainage anterior partial pericardiectomy  09/03/11    Current Medications: Current Outpatient Prescriptions  Medication Sig Dispense Refill  . ibuprofen (ADVIL,MOTRIN) 400 MG tablet Take 400 mg by mouth every 6 (six) hours as needed.    . metoprolol tartrate (LOPRESSOR) 25 MG tablet Take 12.5 mg by mouth 2 (two) times daily.     No current facility-administered medications for this visit.      Allergies:   Latex; Penicillins; Tape; Z-pak [azithromycin]; and Amoxicillin   Social History   Social History  . Marital status: Single    Spouse name: N/A  . Number of children: 4  . Years of education: N/A   Occupational History  . unemployed Therapist, sports retired    Social History Main Topics  . Smoking status: Former Smoker    Packs/day:  1.50    Years: 10.00    Types: Cigarettes    Quit date: 05/19/1979  . Smokeless tobacco: Never Used  . Alcohol use No  . Drug use: No  . Sexual activity: Not Asked   Other Topics Concern  . None   Social History Narrative  . None     Family History:  Family History  Problem Relation Age of Onset  . Hypertension Father   . Stroke Father   . Hypertension Mother   . Ovarian cancer Mother   . Prostate cancer Brother   . Thyroid disease Sister   . Hypertension  Sister   . Leukemia Maternal Aunt     ROS:   Please see the history of present illness.  All other systems are reviewed and otherwise negative.    PHYSICAL EXAM:   VS:  BP (!) 142/86 (BP Location: Left Arm)   Pulse 71   Ht 5' 5.5" (1.664 m)   Wt 242 lb 9.6 oz (110 kg)   BMI 39.76 kg/m   BMI: Body mass index is 39.76 kg/m. GEN: Well nourished, well developed obese AAF, in no acute distress  HEENT: normocephalic, atraumatic Neck: no JVD, carotid bruits, or masses Cardiac: RRR; no murmurs, rubs, or gallops, no edema  Respiratory:  clear to auscultation bilaterally, normal work of breathing GI: soft, nontender, nondistended, + BS MS: no deformity or atrophy  Skin: warm and dry, no rash Neuro:  Alert and Oriented x 3, Strength and sensation are intact, follows commands Psych: euthymic mood, full affect  Wt Readings from Last 3 Encounters:  11/02/16 242 lb 9.6 oz (110 kg)  07/29/16 240 lb 1.9 oz (108.9 kg)  10/31/15 236 lb 1.9 oz (107.1 kg)      Studies/Labs Reviewed:   EKG:  EKG was ordered today and personally reviewed by me and demonstrates NSR 71bpm, possible LAE, NSVICD, cannot r/o prior anterior infarct, nonspecific ST-T changes  Recent Labs: No results found for requested labs within last 8760 hours.   Lipid Panel No results found for: CHOL, TRIG, HDL, CHOLHDL, VLDL, LDLCALC, LDLDIRECT  Additional studies/ records that were reviewed today include: Summarized above.    ASSESSMENT & PLAN:   1. Palpitations - inconsistent historian making assessment somewhat challenging, but the episode she described on Saturday did sound like it could represent recurrent AF. Identification of recurrence would be important given her CHADSVASC of 3, which would warrant anticoagulation if found. She is wary of blood thinners but said she would like to read up on them in the meantime. She would not want to start one unless recurrent atrial fib was found. She was initially hesitant about  event monitoring but agreeable - will arrange 30 day monitor. Will also f/u echocardiogram to exclude any interim structural changes. Have asked nurse to request copy of recent TSH, CMET, CBC from PCP's office. The limited info via POC report I have is that glucose was 101, BUN 16, Cr 0.910, TSH 0.980.  2. Pre-operative evaluation - await above information before clearing for surgery. 3. Paroxysmal atrial fibrillation - see above. This occurred in 2013 in setting of pericarditis. 4. Hypertension - uncontrolled. Patient is adamant that she does not wish to make any medication changes at this time. Discussed risk of uncontrolled BP. She affirms she will "work on this." I told her to keep [place] informed if BP remains poorly controlled. Also discussed importance of long-term weight loss. 5. History of pericardial effusion - f/u echocardiogram.  Disposition: F/u  with Dr. Betsy Pries team APP 1 week after monitor completion.   Medication Adjustments/Labs and Tests Ordered: Current medicines are reviewed at length with the patient today.  Concerns regarding medicines are outlined above. Medication changes, Labs and Tests ordered today are summarized above and listed in the Patient Instructions accessible in Encounters.   Signed, Charlie Pitter, PA-C  11/02/2016 12:11 PM    [facility] [address], [place], [place]  [address] Phone: [phone]; Fax: [phone]

## 2016-11-02 ENCOUNTER — Encounter (INDEPENDENT_AMBULATORY_CARE_PROVIDER_SITE_OTHER): Payer: Self-pay

## 2016-11-02 ENCOUNTER — Encounter: Payer: Self-pay | Admitting: Physician Assistant

## 2016-11-02 ENCOUNTER — Ambulatory Visit (INDEPENDENT_AMBULATORY_CARE_PROVIDER_SITE_OTHER): Payer: Medicare Other | Admitting: Physician Assistant

## 2016-11-02 VITALS — BP 142/86 | HR 71 | Ht 65.5 in | Wt 242.6 lb

## 2016-11-02 DIAGNOSIS — I313 Pericardial effusion (noninflammatory): Secondary | ICD-10-CM

## 2016-11-02 DIAGNOSIS — I3139 Other pericardial effusion (noninflammatory): Secondary | ICD-10-CM

## 2016-11-02 DIAGNOSIS — R002 Palpitations: Secondary | ICD-10-CM

## 2016-11-02 DIAGNOSIS — I10 Essential (primary) hypertension: Secondary | ICD-10-CM

## 2016-11-02 DIAGNOSIS — I480 Paroxysmal atrial fibrillation: Secondary | ICD-10-CM | POA: Diagnosis not present

## 2016-11-02 NOTE — Patient Instructions (Addendum)
Medication Instructions:  None Ordered   Labwork: None Ordered   Testing/Procedures: Your physician has requested that you have an echocardiogram. Echocardiography is a painless test that uses sound waves to create images of your heart. It provides your doctor with information about the size and shape of your heart and how well your heart's chambers and valves are working. This procedure takes approximately one hour. There are no restrictions for this procedure.  Your physician has recommended that you wear an event monitor. Event monitors are medical devices that record the heart's electrical activity. Doctors most often [place] these monitors to diagnose arrhythmias. Arrhythmias are problems with the speed or rhythm of the heartbeat. The monitor is a small, portable device. You can wear one while you do your normal daily activities. This is usually used to diagnose what is causing palpitations/syncope (passing out).    Follow-Up: Your physician recommends that you schedule a follow-up appointment in: one week after event monitor completed.   Any Other Special Instructions Will Be Listed Below (If Applicable).  Call if blood pressures exceeds 120/80 on regular basis  If you need a refill on your cardiac medications before your next appointment, please call your pharmacy.

## 2016-11-13 ENCOUNTER — Other Ambulatory Visit: Payer: Self-pay

## 2016-11-13 ENCOUNTER — Ambulatory Visit (HOSPITAL_COMMUNITY): Payer: Medicare Other | Attending: Cardiology

## 2016-11-13 ENCOUNTER — Ambulatory Visit (INDEPENDENT_AMBULATORY_CARE_PROVIDER_SITE_OTHER): Payer: Medicare Other

## 2016-11-13 DIAGNOSIS — R002 Palpitations: Secondary | ICD-10-CM | POA: Diagnosis not present

## 2016-11-13 DIAGNOSIS — I340 Nonrheumatic mitral (valve) insufficiency: Secondary | ICD-10-CM | POA: Insufficient documentation

## 2016-12-04 ENCOUNTER — Telehealth: Payer: Self-pay | Admitting: Cardiology

## 2016-12-04 NOTE — Telephone Encounter (Signed)
Patient calling about having sensitivity to her monitor stickers. Patient has called monitor company and has tried the hypoallergenic  stickers, but patient stated she is still having itching and rash from stickers. Informed patient that message would be sent to monitor tech to see if she has any other suggestions.

## 2016-12-04 NOTE — Telephone Encounter (Signed)
°  New Prob  Pt states she currently has a heart monitor on. Would like to speak to someone regarding removing it. Please call.

## 2016-12-31 ENCOUNTER — Encounter: Payer: Self-pay | Admitting: Physician Assistant

## 2016-12-31 ENCOUNTER — Ambulatory Visit (INDEPENDENT_AMBULATORY_CARE_PROVIDER_SITE_OTHER): Payer: Medicare Other | Admitting: Physician Assistant

## 2016-12-31 VITALS — BP 144/86 | HR 70 | Ht 66.0 in | Wt 245.0 lb

## 2016-12-31 DIAGNOSIS — I480 Paroxysmal atrial fibrillation: Secondary | ICD-10-CM

## 2016-12-31 DIAGNOSIS — R002 Palpitations: Secondary | ICD-10-CM | POA: Diagnosis not present

## 2016-12-31 DIAGNOSIS — I10 Essential (primary) hypertension: Secondary | ICD-10-CM

## 2016-12-31 DIAGNOSIS — Z8679 Personal history of other diseases of the circulatory system: Secondary | ICD-10-CM

## 2016-12-31 MED ORDER — METOPROLOL TARTRATE 25 MG PO TABS: 12.5000 mg | ORAL_TABLET | Freq: Two times a day (BID) | ORAL | 3 refills | Status: DC

## 2016-12-31 NOTE — Progress Notes (Signed)
Cardiology Office Note    Date:  12/31/2016  ID:  [name], DOB [birth year], MRN [id number] PCP:  Aretta Nip, MD  Cardiologist: Dr. Radford Pax   Chief Complaint: f/u palpitations  History of Present Illness:  Barbara Nelson is a 76 y.o. female with history of viral pericarditis with tamponade s/p pericardial window and anterior partial pericardiectomy in 2013 with post op atrial fibrillation, LBBB, HTN, pulmonary nodule who presents for f/u of testing. She also has a history of lower extremity edema when increasing sodium in diet. Cardiac event monitor 10/2015 for recurrent palpitations showed NSR. Last labs 2013 showed glucose 127, K 3.7, Cr 1.1, WBC 11.1, Hgb 14.6, Plt 177. Last echo in 02/2012 showed EF 64.5%, mild TR.  I saw her in 10/2016 - had presented back the request of her PCP's office for evaluation of palpitations - per PCP note, "since then has had spells where her heart rate is 'rapid' and felt 'weak' and had to sit down." She herself was somewhat vague about symptoms at the visit; daughter jumped into the conversation and reminded her mother this has occurred multiple times. She was worried the patient tries to downplay symptoms. The patient then admitted that she had noticed increased palpitations since son died unexpectedly in September 14, 2022. She also had noticed occasional dizziness, most often when bending over, only lasts a few seconds, resolves upon uprighting herself. No h/o TIA, stroke or bleeding. She reported stable mild DOE, no chest pain. BP was mildly elevated but she did not wish to adjust meds. 2D echo 11/13/16 showed mild LVH, EF 55-60%, grade 1 DD, PASP 33mmHg. Event monitor showed NSR average HR 75bpm, did not have any palpitations or weak spells while wearing.  She returns for follow-up with her daughter. She is feeling great. No chest pain, SOB, palpitations. She does have episodic dizziness upon standing but no pre-syncope or syncope. She has experienced skin  irritation from the event monitor.  Past Medical History:  Diagnosis Date  . Benign essential HTN 10/31/2015  . Cardiac tamponade due to viral pericarditis   . LBBB (left bundle branch block)   . Lung mass    fungal infection s/p resection at [facility]  . Postoperative atrial fibrillation ([place])   . Pulmonary nodule     Past Surgical History:  Procedure Laterality Date  . ABDOMINAL HYSTERECTOMY  1988  . chest tube placement  09/03/11  . LUNG REMOVAL, PARTIAL    . right knee arthoscopy with meniscal repair    . subxiphoid pericardial window and drainage anterior partial pericardiectomy  09/03/11    Current Medications: Current Meds  Medication Sig  . ibuprofen (ADVIL,MOTRIN) 400 MG tablet Take 400 mg by mouth every 6 (six) hours as needed.  . metoprolol tartrate (LOPRESSOR) 25 MG tablet Take 12.5 mg by mouth 2 (two) times daily.     Allergies:   Latex; Penicillins; Tape; Z-pak [azithromycin]; and Amoxicillin   Social History   Social History  . Marital status: Single    Spouse name: N/A  . Number of children: 4  . Years of education: N/A   Occupational History  . unemployed Therapist, sports retired    Social History Main Topics  . Smoking status: Former Smoker    Packs/day: 1.50    Years: 10.00    Types: Cigarettes    Quit date: 05/19/1979  . Smokeless tobacco: Never Used  . Alcohol use No  . Drug use: No  . Sexual activity: Not Asked  Other Topics Concern  . None   Social History Narrative  . None     Family History:  Family History  Problem Relation Age of Onset  . Hypertension Father   . Stroke Father   . Hypertension Mother   . Ovarian cancer Mother   . Prostate cancer Brother   . Thyroid disease Sister   . Hypertension Sister   . Leukemia Maternal Aunt    ROS:   Please see the history of present illness.  All other systems are reviewed and otherwise negative.    PHYSICAL EXAM:   VS:  BP (!) 144/86   Pulse 70   Ht 5\' 6"  (1.676 m)   Wt 245 lb (111.1 kg)    LMP  (LMP Unknown)   BMI 39.54 kg/m   BMI: Body mass index is 39.54 kg/m. GEN: Well nourished, well developed AAF in no acute distress  HEENT: normocephalic, atraumatic Neck: no JVD, carotid bruits, or masses Cardiac: RRR; no murmurs, rubs, or gallops, no edema  Respiratory:  clear to auscultation bilaterally, normal work of breathing GI: soft, nontender, nondistended, + BS MS: no deformity or atrophy  Skin: warm and dry, minor skin iirritation/hyperpigmentation at sites of prior electrode placement Neuro:  Alert and Oriented x 3, Strength and sensation are intact, follows commands Psych: euthymic mood, full affect  Wt Readings from Last 3 Encounters:  12/31/16 245 lb (111.1 kg)  11/02/16 242 lb 9.6 oz (110 kg)  07/29/16 240 lb 1.9 oz (108.9 kg)      Studies/Labs Reviewed:   EKG:   EKG was not ordered today.  Recent Labs: No results found for requested labs within last 8760 hours.   Lipid Panel No results found for: CHOL, TRIG, HDL, CHOLHDL, VLDL, LDLCALC, LDLDIRECT  Additional studies/ records that were reviewed today include: Summarized above.    ASSESSMENT & PLAN:   1. Palpitations - resolved, question due to mild orthostasis. These have resolved and she is feeling well. Asked her to notify us of any recurrent symptoms. She is cleared for cataract surgery. 2. Paroxysmal atrial fibrillation - maintaining NSR, no recurrent palpitations recently. Event monitor benign. This was observed perioperatively at time of pericarditis without clinical recurrence. Instructed her to use hydrocortisone on skin irritation marks from event monitor. 3. Essential HTN - remains borderline elevated. However, she has h/o dizziness with higher doses of beta blocker. We discussed risk of poorly controlled BP in context of diastolic dysfunction seen on echo but she is adamant that she does not wish to add any medications at this time. Discussed restriction of sodium intake and healthy weight  loss. 4. H/o pericardial effusion - no recurrence by echo.  Disposition: F/u with Dr. Radford Pax in 6 months.   Medication Adjustments/Labs and Tests Ordered: Current medicines are reviewed at length with the patient today.  Concerns regarding medicines are outlined above. Medication changes, Labs and Tests ordered today are summarized above and listed in the Patient Instructions accessible in Encounters.   Signed, Barbara Pitter, PA-C  12/31/2016 3:25 PM    [facility] [place], [place], [place]  [address] Phone: [phone]; Fax: [phone]

## 2016-12-31 NOTE — Patient Instructions (Signed)
Medication Instructions:  Your physician recommends that you continue on your current medications as directed. Please refer to the Current Medication list given to you today.   Labwork: None ordered  Testing/Procedures: None ordered  Follow-Up: Your physician wants you to follow-up in: 6 MONTHS WITH DR. TURNER.  You will receive a reminder letter in the mail two months in advance. If you don't receive a letter, please call our office to schedule the follow-up appointment.    Any Other Special Instructions Will Be Listed Below (If Applicable).    If you need a refill on your cardiac medications before your next appointment, please call your pharmacy.   

## 2017-01-01 ENCOUNTER — Ambulatory Visit: Payer: Medicare Other | Admitting: Physician Assistant

## 2017-02-01 ENCOUNTER — Ambulatory Visit: Payer: Medicare Other | Admitting: Cardiology

## 2017-06-14 DIAGNOSIS — L811 Chloasma: Secondary | ICD-10-CM | POA: Diagnosis not present

## 2017-07-26 DIAGNOSIS — L811 Chloasma: Secondary | ICD-10-CM | POA: Diagnosis not present

## 2017-08-24 ENCOUNTER — Encounter: Payer: Self-pay | Admitting: Cardiology

## 2017-08-27 ENCOUNTER — Ambulatory Visit: Payer: Medicare Other | Admitting: Cardiology

## 2017-09-17 ENCOUNTER — Ambulatory Visit (INDEPENDENT_AMBULATORY_CARE_PROVIDER_SITE_OTHER): Payer: Medicare Other | Admitting: Cardiology

## 2017-09-17 ENCOUNTER — Encounter: Payer: Self-pay | Admitting: Cardiology

## 2017-09-17 VITALS — BP 122/80 | HR 74 | Ht 66.0 in | Wt 247.0 lb

## 2017-09-17 DIAGNOSIS — I9789 Other postprocedural complications and disorders of the circulatory system, not elsewhere classified: Secondary | ICD-10-CM | POA: Diagnosis not present

## 2017-09-17 DIAGNOSIS — I313 Pericardial effusion (noninflammatory): Secondary | ICD-10-CM

## 2017-09-17 DIAGNOSIS — I10 Essential (primary) hypertension: Secondary | ICD-10-CM | POA: Diagnosis not present

## 2017-09-17 DIAGNOSIS — I447 Left bundle-branch block, unspecified: Secondary | ICD-10-CM | POA: Diagnosis not present

## 2017-09-17 DIAGNOSIS — I4891 Unspecified atrial fibrillation: Secondary | ICD-10-CM | POA: Diagnosis not present

## 2017-09-17 DIAGNOSIS — R600 Localized edema: Secondary | ICD-10-CM | POA: Diagnosis not present

## 2017-09-17 DIAGNOSIS — I3139 Other pericardial effusion (noninflammatory): Secondary | ICD-10-CM

## 2017-09-17 MED ORDER — METOPROLOL SUCCINATE ER 25 MG PO TB24: 12.5000 mg | ORAL_TABLET | Freq: Every day | ORAL | 11 refills | Status: DC

## 2017-09-17 NOTE — Progress Notes (Signed)
Cardiology Office Note:    Date:  09/17/2017   ID:  Barbara Nelson, DOB October 21, 1940, MRN [id number]  PCP:  Aretta Nip, MD  Cardiologist:  No primary care provider on file.    Referring MD: Aretta Nip, MD   Chief Complaint  Patient presents with  . Follow-up    pericardial effusion, PAF    History of Present Illness:    Barbara Nelson is a 77 y.o. female with a hx of viral pericarditis with tamponade s/p pericardial window and anterior partial pericardiectomy in 2013 with post op atrial fibrillation which she has not had any further problems with. She also has a chronic LBBB.  She saw Sharrell Ku, PA back in the summer and was complaining of palpitations but it was around the time her son died earlier in the year.  Event monitor showed normal sinus rhythm with no arrhythmias.  But she did not have any palpitations while wearing the device.  She is here today for followup and is doing well.  She denies any chest pain or pressure, SOB, DOE, PND, orthopnea, dizziness, palpitations or syncope. She is compliant with her meds and is tolerating meds states that she occasionally has some wheezing and also feels tired after taking her Lopressor.  Occasionally has some trace edema  Past Medical History:  Diagnosis Date  . Benign essential HTN 10/31/2015  . Cardiac tamponade due to viral pericarditis   . LBBB (left bundle branch block)   . Lung mass    fungal infection s/p resection at [facility]  . Postoperative atrial fibrillation ([place])   . Pulmonary nodule     Past Surgical History:  Procedure Laterality Date  . ABDOMINAL HYSTERECTOMY  1988  . chest tube placement  09/03/11  . LUNG REMOVAL, PARTIAL    . right knee arthoscopy with meniscal repair    . subxiphoid pericardial window and drainage anterior partial pericardiectomy  09/03/11    Current Medications: Current Meds  Medication Sig  . ibuprofen (ADVIL,MOTRIN) 400 MG tablet Take 400 mg by mouth every 6 (six) hours  as needed.  . metoprolol tartrate (LOPRESSOR) 25 MG tablet Take 0.5 tablets (12.5 mg total) by mouth 2 (two) times daily.     Allergies:   Latex; Penicillins; Tape; Z-pak [azithromycin]; and Amoxicillin   Social History   Socioeconomic History  . Marital status: Single    Spouse name: Not on file  . Number of children: 4  . Years of education: Not on file  . Highest education level: Not on file  Occupational History  . Occupation: unemployed Therapist, sports retired  Scientific laboratory technician  . Financial resource strain: Not on file  . Food insecurity:    Worry: Not on file    Inability: Not on file  . Transportation needs:    Medical: Not on file    Non-medical: Not on file  Tobacco Use  . Smoking status: Former Smoker    Packs/day: 1.50    Years: 10.00    Pack years: 15.00    Types: Cigarettes    Last attempt to quit: 05/19/1979    Years since quitting: 38.3  . Smokeless tobacco: Never Used  Substance and Sexual Activity  . Alcohol use: No  . Drug use: No  . Sexual activity: Not on file  Lifestyle  . Physical activity:    Days per week: Not on file    Minutes per session: Not on file  . Stress: Not on file  Relationships  .  Social connections:    Talks on phone: Not on file    Gets together: Not on file    Attends religious service: Not on file    Active member of club or organization: Not on file    Attends meetings of clubs or organizations: Not on file    Relationship status: Not on file  Other Topics Concern  . Not on file  Social History Narrative  . Not on file     Family History: The patient's family history includes Hypertension in her father, mother, and sister; Leukemia in her maternal aunt; Ovarian cancer in her mother; Prostate cancer in her brother; Stroke in her father; Thyroid disease in her sister.  ROS:   Please see the history of present illness.    ROS  All other systems reviewed and negative.   EKGs/Labs/Other Studies Reviewed:    The following studies were  reviewed today: none  EKG:  EKG is ordered today and showed NSR with LBBB  Recent Labs: No results found for requested labs within last 8760 hours.   Recent Lipid Panel No results found for: CHOL, TRIG, HDL, CHOLHDL, VLDL, LDLCALC, LDLDIRECT  Physical Exam:    VS:  BP 122/80   Pulse 74   Ht 5\' 6"  (1.676 m)   Wt 247 lb (112 kg)   LMP  (LMP Unknown)   SpO2 96%   BMI 39.87 kg/m     Wt Readings from Last 3 Encounters:  09/17/17 247 lb (112 kg)  12/31/16 245 lb (111.1 kg)  11/02/16 242 lb 9.6 oz (110 kg)     GEN:  Well nourished, well developed in no acute distress HEENT: Normal NECK: No JVD; No carotid bruits LYMPHATICS: No lymphadenopathy CARDIAC: RRR, no murmurs, rubs, gallops RESPIRATORY:  Clear to auscultation without rales, wheezing or rhonchi  ABDOMEN: Soft, non-tender, non-distended MUSCULOSKELETAL: Trace edema; No deformity  SKIN: Warm and dry NEUROLOGIC:  Alert and oriented x 3 PSYCHIATRIC:  Normal affect   ASSESSMENT:    1. Pericardial effusion/ pericarditis   2. Postoperative atrial fibrillation (HCC)   3. LBBB (left bundle branch block)   4. Benign essential HTN   5. Edema extremities    PLAN:    In order of problems listed above:  1.  Viral pericarditis with pericardial effusion with tamponade status post pericardial window and anterior partial pericardiectomy in 2013.  There was no residual pericardial effusion on 2D echocardiogram a year ago.  2.  Postoperative A. fib after pericardial window -there has been no recurrence of this.  She was not put on chronic anticoagulation given the acute nature in the setting of pericarditis.  She is on short acting Lopressor 12.5 mg twice daily but is complaining of intermittent wheezing and also feeling fatigued after she takes her dose.  I am going to change her from Lopressor 12.5 mg twice daily to Toprol-XL 12.5 mg daily.  She will call me with any side effects.  3.  LBBB -she is asymptomatic with no chest  pain or shortness of breath.  4.  HTN -BP is well controlled on exam today.  She has had problems with dizziness on higher doses of beta-blocker.  Blood pressure is well controlled on exam today.  I am going to change her though to Toprol-XL 12.5 mg daily instead of Lopressor twice daily because she is having some wheezing and gets fatigued right after taking her dose  5.  Lower extremity edema -this is controlled on low-sodium diet  Medication Adjustments/Labs and Tests Ordered: Current medicines are reviewed at length with the patient today.  Concerns regarding medicines are outlined above.  No orders of the defined types were placed in this encounter.  No orders of the defined types were placed in this encounter.   Signed, Fransico Him, MD  09/17/2017 11:14 AM    [place]

## 2017-09-17 NOTE — Patient Instructions (Addendum)
Medication Instructions:  Your physician has recommended you make the following change in your medication:  STOP: Lopressor  START: Toprol XL 12.5 mg (0.5 tablet) one time a day   If you need a refill on your cardiac medications, please contact your pharmacy first.  Labwork: None ordered   Testing/Procedures: None ordered   Follow-Up: Your physician wants you to follow-up in: 1 year with Dr. Radford Pax. You will receive a reminder letter in the mail two months in advance. If you don't receive a letter, please call our office to schedule the follow-up appointment.  Any Other Special Instructions Will Be Listed Below (If Applicable).   Thank you for choosing [facility], RN  [phone]  If you need a refill on your cardiac medications before your next appointment, please call your pharmacy.

## 2017-09-23 ENCOUNTER — Telehealth: Payer: Self-pay | Admitting: Cardiology

## 2017-09-23 MED ORDER — METOPROLOL TARTRATE 25 MG PO TABS: 12.5000 mg | ORAL_TABLET | Freq: Two times a day (BID) | ORAL | 3 refills | Status: DC

## 2017-09-23 NOTE — Telephone Encounter (Signed)
Informed patient to stop Toprol and restart Lopressor 12.5 mg BID and call office if she develops any wheezing. Patient verbalized understanding and thankful for the call.

## 2017-09-23 NOTE — Telephone Encounter (Signed)
Patient has been on Toprol 12.5 mg once a day since Saturday. She states she feels jittery and does not like the way it makes her feel. She states the symptoms have improved some but she does not want to continue taking Toprol. She requested to go back to Lopressor 12.5 mg BID. I informed patient I would forwarded to Dr. Radford Pax for review. Patient verbalized understanding and thankful for the call.

## 2017-09-23 NOTE — Telephone Encounter (Signed)
New message   Pt c/o medication issue:  1. Name of Medication: metoprolol succinate (TOPROL XL) 25 MG 24 hr tablet  2. How are you currently taking this medication (dosage and times per day)?   3. Are you having a reaction (difficulty breathing--STAT)? No  4. What is your medication issue? Patient wants to switch to a previous medication, feels jittery.

## 2017-09-23 NOTE — Telephone Encounter (Signed)
I'm fine with changing back to metoprolol tartrate but if she develops wheezing she will need to let me know

## 2017-10-13 DIAGNOSIS — S76319A Strain of muscle, fascia and tendon of the posterior muscle group at thigh level, unspecified thigh, initial encounter: Secondary | ICD-10-CM | POA: Diagnosis not present

## 2018-02-23 DIAGNOSIS — H2513 Age-related nuclear cataract, bilateral: Secondary | ICD-10-CM | POA: Diagnosis not present

## 2018-02-23 DIAGNOSIS — H04123 Dry eye syndrome of bilateral lacrimal glands: Secondary | ICD-10-CM | POA: Diagnosis not present

## 2018-03-10 DIAGNOSIS — H43813 Vitreous degeneration, bilateral: Secondary | ICD-10-CM | POA: Diagnosis not present

## 2018-03-10 DIAGNOSIS — H04123 Dry eye syndrome of bilateral lacrimal glands: Secondary | ICD-10-CM | POA: Diagnosis not present

## 2018-03-10 DIAGNOSIS — H25813 Combined forms of age-related cataract, bilateral: Secondary | ICD-10-CM | POA: Diagnosis not present

## 2018-03-30 DIAGNOSIS — H2511 Age-related nuclear cataract, right eye: Secondary | ICD-10-CM | POA: Diagnosis not present

## 2018-03-30 DIAGNOSIS — H2513 Age-related nuclear cataract, bilateral: Secondary | ICD-10-CM | POA: Diagnosis not present

## 2018-04-12 DIAGNOSIS — H268 Other specified cataract: Secondary | ICD-10-CM | POA: Diagnosis not present

## 2018-04-12 DIAGNOSIS — H2511 Age-related nuclear cataract, right eye: Secondary | ICD-10-CM | POA: Diagnosis not present

## 2018-04-12 DIAGNOSIS — H25811 Combined forms of age-related cataract, right eye: Secondary | ICD-10-CM | POA: Diagnosis not present

## 2018-04-22 DIAGNOSIS — H2512 Age-related nuclear cataract, left eye: Secondary | ICD-10-CM | POA: Diagnosis not present

## 2018-05-03 DIAGNOSIS — H268 Other specified cataract: Secondary | ICD-10-CM | POA: Diagnosis not present

## 2018-05-03 DIAGNOSIS — H25812 Combined forms of age-related cataract, left eye: Secondary | ICD-10-CM | POA: Diagnosis not present

## 2018-05-03 DIAGNOSIS — H2512 Age-related nuclear cataract, left eye: Secondary | ICD-10-CM | POA: Diagnosis not present

## 2018-10-02 NOTE — Progress Notes (Signed)
Virtual Visit via Video Note   This visit type was conducted due to national recommendations for restrictions regarding the COVID-19 Pandemic (e.g. social distancing) in an effort to limit this patient's exposure and mitigate transmission in our community.  Due to her co-morbid illnesses, this patient is at least at moderate risk for complications without adequate follow up.  This format is felt to be most appropriate for this patient at this time.  All issues noted in this document were discussed and addressed.  A limited physical exam was performed with this format.  Please refer to the patient's chart for her consent to telehealth for [facility].   Evaluation Performed:  Follow-up visit  This visit type was conducted due to national recommendations for restrictions regarding the COVID-19 Pandemic (e.g. social distancing).  This format is felt to be most appropriate for this patient at this time.  All issues noted in this document were discussed and addressed.  No physical exam was performed (except for noted visual exam findings with Video Visits).  Please refer to the patient's chart (MyChart message for video visits and phone note for telephone visits) for the patient's consent to telehealth for [facility].  Date:  10/03/2018   ID:  Jeremy Johann, DOB [birth year], MRN [id number]  Patient Location:  Home  Provider location:   [place]  PCP:  Aretta Nip, MD  Cardiologist:  Fransico Him, MD Electrophysiologist:  None   Chief Complaint: Pericardial effusion EF  History of Present Illness:    MARSHEILA ALEJO is a 78 y.o. female who presents via audio/video conferencing for a telehealth visit today.    RICK WARNICK is a 78 y.o. female with a hx of viral pericarditis with tamponade s/p pericardial window and anterior partial pericardiectomy in 2013 with post op atrial fibrillation. She also has a chronic LBBB.   She is here today for followup and is doing  well.  She denies any chest pain or pressure, SOB, DOE, PND, orthopnea, LE edema, dizziness, palpitations or syncope. She is compliant with her meds and is tolerating meds with no SE.    The patient does not have symptoms concerning for COVID-19 infection (fever, chills, cough, or new shortness of breath).    Prior CV studies:   The following studies were reviewed today:  none  Past Medical History:  Diagnosis Date  . Benign essential HTN 10/31/2015  . Cardiac tamponade due to viral pericarditis   . LBBB (left bundle branch block)   . Lung mass    fungal infection s/p resection at [facility]  . Postoperative atrial fibrillation ([place])   . Pulmonary nodule    Past Surgical History:  Procedure Laterality Date  . ABDOMINAL HYSTERECTOMY  1988  . chest tube placement  09/03/11  . LUNG REMOVAL, PARTIAL    . right knee arthoscopy with meniscal repair    . subxiphoid pericardial window and drainage anterior partial pericardiectomy  09/03/11     Current Meds  Medication Sig  . ibuprofen (ADVIL,MOTRIN) 400 MG tablet Take 400 mg by mouth every 6 (six) hours as needed.  . metoprolol tartrate (LOPRESSOR) 25 MG tablet Take 0.5 tablets (12.5 mg total) by mouth 2 (two) times daily.  . Polyvinyl Alcohol-Povidone (REFRESH OP) Apply 1 drop to eye 3 (three) times daily.     Allergies:   Latex; Penicillins; Tape; Z-pak [azithromycin]; Amoxicillin; and Sulfa antibiotics   Social History   Tobacco Use  . Smoking status: Former Smoker  Packs/day: 1.50    Years: 10.00    Pack years: 15.00    Types: Cigarettes    Last attempt to quit: 05/19/1979    Years since quitting: 39.4  . Smokeless tobacco: Never Used  Substance Use Topics  . Alcohol use: No  . Drug use: No     Family Hx: The patient's family history includes Hypertension in her father, mother, and sister; Leukemia in her maternal aunt; Ovarian cancer in her mother; Prostate cancer in her brother; Stroke in her father; Thyroid disease in her  sister.  ROS:   Please see the history of present illness.     All other systems reviewed and are negative.   Labs/Other Tests and Data Reviewed:    Recent Labs: No results found for requested labs within last 8760 hours.   Recent Lipid Panel No results found for: CHOL, TRIG, HDL, CHOLHDL, LDLCALC, LDLDIRECT  Wt Readings from Last 3 Encounters:  10/03/18 240 lb (108.9 kg)  09/17/17 247 lb (112 kg)  12/31/16 245 lb (111.1 kg)     Objective:    Vital Signs:  Ht 5\' 6"  (1.676 m)   Wt 240 lb (108.9 kg)   LMP  (LMP Unknown)   BMI 38.74 kg/m    CONSTITUTIONAL:  Well nourished, well developed female in no acute distress.  EYES: anicteric MOUTH: oral mucosa is pink RESPIRATORY: Normal respiratory effort, symmetric expansion CARDIOVASCULAR: No peripheral edema SKIN: No rash, lesions or ulcers MUSCULOSKELETAL: no digital cyanosis NEURO: Cranial Nerves II-XII grossly intact, moves all extremities PSYCH: Intact judgement and insight.  A&O x 3, Mood/affect appropriate   ASSESSMENT & PLAN:    1.  History of viral pericarditis -this was complicated by tamponade requiring pericardial window and anterior partial pericardectomy in 2013.  Her last 2D echocardiogram 11/04/2016 showed no residual pericardial effusion.  2.  Postoperative atrial fibrillation -this was after pericardial window.  She has had no reoccurrence.  She was not placed on chronic anticoagulation given the acute nature in the setting of pericarditis.  She has not had any palpitations.  She will continue on lopressor 12.5mg  BID.  3.  Chronic left bundle branch block.  She has not had any anginal symptoms.  Nuclear stress test in 2013 showed no ischemia   4.  Hypertension - She will continue on  Lopressor 12.5mg  BID.  5.  Chronic lower extremity edema -she has not required any diuretic therapy.  This is controlled on low-sodium diet.  COVID-19 Education: The signs and symptoms of COVID-19 were discussed with the  patient and how to seek care for testing (follow up with PCP or arrange E-visit).  The importance of social distancing was discussed today.  Patient Risk:   After full review of this patient's clinical status, I feel that they are at least moderate risk at this time.  Time:   Today, I have spent 10 minutes directly with the patient on video discussing medical problems including pericardial effusion and PAF.  We also reviewed the symptoms of COVID 19 and the ways to protect against contracting the virus with telehealth technology.  I spent an additional 5 minutes reviewing patient's chart including prior records and labs.  Medication Adjustments/Labs and Tests Ordered: Current medicines are reviewed at length with the patient today.  Concerns regarding medicines are outlined above.  Tests Ordered: No orders of the defined types were placed in this encounter.  Medication Changes: No orders of the defined types were placed in this encounter.  Disposition:  Follow up in 1 year(s)  Signed, Fransico Him, MD  10/03/2018 1:40 PM    [facility]

## 2018-10-03 ENCOUNTER — Other Ambulatory Visit: Payer: Self-pay

## 2018-10-03 ENCOUNTER — Telehealth (INDEPENDENT_AMBULATORY_CARE_PROVIDER_SITE_OTHER): Payer: Medicare Other | Admitting: Cardiology

## 2018-10-03 ENCOUNTER — Encounter: Payer: Self-pay | Admitting: Cardiology

## 2018-10-03 VITALS — Ht 66.0 in | Wt 240.0 lb

## 2018-10-03 DIAGNOSIS — Z9889 Other specified postprocedural states: Secondary | ICD-10-CM

## 2018-10-03 DIAGNOSIS — I313 Pericardial effusion (noninflammatory): Secondary | ICD-10-CM

## 2018-10-03 DIAGNOSIS — Z7189 Other specified counseling: Secondary | ICD-10-CM

## 2018-10-03 DIAGNOSIS — I319 Disease of pericardium, unspecified: Secondary | ICD-10-CM | POA: Diagnosis not present

## 2018-10-03 DIAGNOSIS — I4891 Unspecified atrial fibrillation: Secondary | ICD-10-CM | POA: Diagnosis not present

## 2018-10-03 DIAGNOSIS — I447 Left bundle-branch block, unspecified: Secondary | ICD-10-CM | POA: Diagnosis not present

## 2018-10-03 DIAGNOSIS — I3139 Other pericardial effusion (noninflammatory): Secondary | ICD-10-CM

## 2018-10-03 DIAGNOSIS — R600 Localized edema: Secondary | ICD-10-CM

## 2018-10-03 DIAGNOSIS — I10 Essential (primary) hypertension: Secondary | ICD-10-CM | POA: Diagnosis not present

## 2018-10-03 DIAGNOSIS — I9789 Other postprocedural complications and disorders of the circulatory system, not elsewhere classified: Secondary | ICD-10-CM

## 2018-10-03 NOTE — Patient Instructions (Signed)

## 2018-12-09 ENCOUNTER — Other Ambulatory Visit: Payer: Self-pay | Admitting: Cardiology

## 2018-12-14 DIAGNOSIS — H40013 Open angle with borderline findings, low risk, bilateral: Secondary | ICD-10-CM | POA: Diagnosis not present

## 2018-12-14 DIAGNOSIS — H43813 Vitreous degeneration, bilateral: Secondary | ICD-10-CM | POA: Diagnosis not present

## 2018-12-14 DIAGNOSIS — Z961 Presence of intraocular lens: Secondary | ICD-10-CM | POA: Diagnosis not present

## 2018-12-14 DIAGNOSIS — H04123 Dry eye syndrome of bilateral lacrimal glands: Secondary | ICD-10-CM | POA: Diagnosis not present

## 2019-02-06 DIAGNOSIS — I10 Essential (primary) hypertension: Secondary | ICD-10-CM | POA: Diagnosis not present

## 2019-02-06 DIAGNOSIS — R109 Unspecified abdominal pain: Secondary | ICD-10-CM | POA: Diagnosis not present

## 2019-02-27 DIAGNOSIS — R739 Hyperglycemia, unspecified: Secondary | ICD-10-CM | POA: Diagnosis not present

## 2019-02-27 DIAGNOSIS — I10 Essential (primary) hypertension: Secondary | ICD-10-CM | POA: Diagnosis not present

## 2019-08-17 ENCOUNTER — Telehealth: Payer: Self-pay | Admitting: Cardiology

## 2019-08-17 DIAGNOSIS — M79602 Pain in left arm: Secondary | ICD-10-CM | POA: Diagnosis not present

## 2019-08-17 NOTE — Telephone Encounter (Signed)
° °  Pt feels shooting up and down to her left arm, no chest pain but feel jittery.

## 2019-08-17 NOTE — Telephone Encounter (Signed)
Received call directly from operator from daughter and patient who states patient is having a "shocking" type feeling in her left arm that started approximately 1 hour ago. She reports the pain is "sharp" and has resolved at the moment. Denies chest/jaw/neck/shoulder discomfort. She states she was sitting at the table not doing anything when the sensation started. She denies recent injury or pain to her left shoulder or arm.  Had her Covid vaccine in the left arm on 3/8 but has not had any swelling or other concerns since that time. She states she feels that her heart rhythm may have been irregular recently. Her daughter is driving her to the walk-in clinic at her Primary care. I advised her that we do not have any available appointments today and that I advise she go to the Primary Care. I advised that if appointment with cardiology is needed, that PCP should call us to get her worked in. I made her annual appointment with Dr. Radford Pax for her and she thanked me for my help.

## 2019-09-04 ENCOUNTER — Other Ambulatory Visit: Payer: Self-pay | Admitting: Cardiology

## 2019-09-08 ENCOUNTER — Encounter: Payer: Self-pay | Admitting: Cardiology

## 2019-09-08 ENCOUNTER — Ambulatory Visit (INDEPENDENT_AMBULATORY_CARE_PROVIDER_SITE_OTHER): Payer: Medicare Other | Admitting: Cardiology

## 2019-09-08 ENCOUNTER — Other Ambulatory Visit: Payer: Self-pay

## 2019-09-08 VITALS — BP 130/80 | HR 94 | Ht 66.0 in | Wt 239.0 lb

## 2019-09-08 DIAGNOSIS — R600 Localized edema: Secondary | ICD-10-CM | POA: Diagnosis not present

## 2019-09-08 DIAGNOSIS — I447 Left bundle-branch block, unspecified: Secondary | ICD-10-CM

## 2019-09-08 DIAGNOSIS — I4891 Unspecified atrial fibrillation: Secondary | ICD-10-CM | POA: Diagnosis not present

## 2019-09-08 DIAGNOSIS — B3323 Viral pericarditis: Secondary | ICD-10-CM | POA: Diagnosis not present

## 2019-09-08 DIAGNOSIS — I10 Essential (primary) hypertension: Secondary | ICD-10-CM

## 2019-09-08 DIAGNOSIS — I9789 Other postprocedural complications and disorders of the circulatory system, not elsewhere classified: Secondary | ICD-10-CM | POA: Diagnosis not present

## 2019-09-08 MED ORDER — METOPROLOL SUCCINATE ER 25 MG PO TB24: 25.0000 mg | ORAL_TABLET | Freq: Every day | ORAL | 3 refills | Status: DC

## 2019-09-08 NOTE — Progress Notes (Signed)
Cardiology Office Note:    Date:  09/08/2019   ID:  Barbara Nelson, DOB December 26, 1940, MRN [id number]  PCP:  Barbara Nip, MD  Cardiologist:  No primary care provider on file.    Referring MD: Barbara Nip, MD   Chief Complaint  Patient presents with  . Follow-up    Pericarditis, HTN, LBBB    History of Present Illness:    Barbara Nelson is a 79 y.o. female with a hx of viral pericarditis with tamponade s/p pericardial window and anterior partial pericardiectomy in 2013 with post op atrial fibrillation. She also has a chronic LBBB.SHe is here today for followup and is doing well.  SHe denies any chest pain or pressure, SOB, DOE, PND, orthopnea, LE edema, dizziness, palpitations or syncope. She occasionally has some wheezing when she takes her metoprolol tartrate.  SHe is compliant with her meds and is tolerating meds with no SE.    Past Medical History:  Diagnosis Date  . Benign essential HTN 10/31/2015  . Cardiac tamponade due to viral pericarditis   . LBBB (left bundle branch block)   . Lung mass    fungal infection s/p resection at [facility]  . Postoperative atrial fibrillation ([place])   . Pulmonary nodule     Past Surgical History:  Procedure Laterality Date  . ABDOMINAL HYSTERECTOMY  1988  . chest tube placement  09/03/11  . LUNG REMOVAL, PARTIAL    . right knee arthoscopy with meniscal repair    . subxiphoid pericardial window and drainage anterior partial pericardiectomy  09/03/11    Current Medications: Current Meds  Medication Sig  . hydrochlorothiazide (HYDRODIURIL) 12.5 MG tablet Take 12.5 mg by mouth every morning.  Marland Kitchen ibuprofen (ADVIL,MOTRIN) 400 MG tablet Take 400 mg by mouth every 6 (six) hours as needed.  . metoprolol tartrate (LOPRESSOR) 25 MG tablet TAKE 0.5 TABLETS (12.5 MG TOTAL) BY MOUTH 2 (TWO) TIMES DAILY.  . Polyvinyl Alcohol-Povidone (REFRESH OP) Apply 1 drop to eye 3 (three) times daily.     Allergies:   Latex, Penicillins, Tape,  Z-pak [azithromycin], Amoxicillin, and Sulfa antibiotics   Social History   Socioeconomic History  . Marital status: Single    Spouse name: Not on file  . Number of children: 4  . Years of education: Not on file  . Highest education level: Not on file  Occupational History  . Occupation: unemployed Therapist, sports retired  Tobacco Use  . Smoking status: Former Smoker    Packs/day: 1.50    Years: 10.00    Pack years: 15.00    Types: Cigarettes    Quit date: 05/19/1979    Years since quitting: 40.3  . Smokeless tobacco: Never Used  Substance and Sexual Activity  . Alcohol use: No  . Drug use: No  . Sexual activity: Not on file  Other Topics Concern  . Not on file  Social History Narrative  . Not on file   Social Determinants of Health   Financial Resource Strain:   . Difficulty of Paying Living Expenses:   Food Insecurity:   . Worried About Charity fundraiser in the Last Year:   . Arboriculturist in the Last Year:   Transportation Needs:   . Film/video editor (Medical):   Marland Kitchen Lack of Transportation (Non-Medical):   Physical Activity:   . Days of Exercise per Week:   . Minutes of Exercise per Session:   Stress:   . Feeling of Stress :  Social Connections:   . Frequency of Communication with Friends and Family:   . Frequency of Social Gatherings with Friends and Family:   . Attends Religious Services:   . Active Member of Clubs or Organizations:   . Attends Archivist Meetings:   Marland Kitchen Marital Status:      Family History: The patient's family history includes Hypertension in her father, mother, and sister; Leukemia in her maternal aunt; Ovarian cancer in her mother; Prostate cancer in her brother; Stroke in her father; Thyroid disease in her sister.  ROS:   Please see the history of present illness.    ROS  All other systems reviewed and negative.   EKGs/Labs/Other Studies Reviewed:    The following studies were reviewed today: none  EKG:  EKG is  ordered  today.  The ekg ordered today demonstrates NSR with nonspecific IVCD  Recent Labs: No results found for requested labs within last 8760 hours.   Recent Lipid Panel No results found for: CHOL, TRIG, HDL, CHOLHDL, VLDL, LDLCALC, LDLDIRECT  Physical Exam:    VS:  BP 130/80   Pulse 94   Ht 5\' 6"  (1.676 m)   Wt 239 lb (108.4 kg)   LMP  (LMP Unknown)   SpO2 97%   BMI 38.58 kg/m     Wt Readings from Last 3 Encounters:  09/08/19 239 lb (108.4 kg)  10/03/18 240 lb (108.9 kg)  09/17/17 247 lb (112 kg)     GEN:  Well nourished, well developed in no acute distress HEENT: Normal NECK: No JVD; No carotid bruits LYMPHATICS: No lymphadenopathy CARDIAC: RRR, no murmurs, rubs, gallops RESPIRATORY:  Clear to auscultation without rales, wheezing or rhonchi  ABDOMEN: Soft, non-tender, non-distended MUSCULOSKELETAL:  No edema; No deformity  SKIN: Warm and dry NEUROLOGIC:  Alert and oriented x 3 PSYCHIATRIC:  Normal affect   ASSESSMENT:    1. Viral pericarditis, unspecified chronicity   2. Postoperative atrial fibrillation (HCC)   3. LBBB (left bundle branch block)   4. Benign essential HTN   5. Edema of extremities    PLAN:    In order of problems listed above:  1.  History of viral pericarditis  -this was complicated by tamponade requiring pericardial window and anterior partial pericardectomy in 2013. -echo 11/04/2016 showed no residual pericardial effusion. -repeat 2D echo since it has been 3 years  2.  Postoperative atrial fibrillation -this was after pericardial window.   -there has been no reoccurrence of afib and denies any palpitations -She was not placed on chronic anticoagulation given the acute nature in the setting of pericarditis.   -change to Toprol XL 25mg  daily due to wheezing with short acting form  3.  Chronic left bundle branch block.   -She has not had any anginal symptoms.   -Nuclear stress test in 2013 showed no ischemia   4.  Hypertension  -BP is  well controlled  -continue Lopressor   5.  Chronic lower extremity edema  -this is well controled on low Na diet -she has not needed any diuretic therapy   Medication Adjustments/Labs and Tests Ordered: Current medicines are reviewed at length with the patient today.  Concerns regarding medicines are outlined above.  Orders Placed This Encounter  Procedures  . EKG 12-Lead   No orders of the defined types were placed in this encounter.   Signed, Fransico Him, MD  09/08/2019 9:31 AM    [place]

## 2019-09-08 NOTE — Patient Instructions (Addendum)
Medication Instructions:  Your physician has recommended you make the following change in your medication:  1) STOP taking Lopressor (metoprolol tartrate) 2) START taking Toprol (metoprolol succinate) 25 mg daily   *If you need a refill on your cardiac medications before your next appointment, please call your pharmacy*  Testing/Procedures: Your physician has requested that you have an echocardiogram. Echocardiography is a painless test that uses sound waves to create images of your heart. It provides your doctor with information about the size and shape of your heart and how well your heart's chambers and valves are working. This procedure takes approximately one hour. There are no restrictions for this procedure.  Follow-Up: At [facility], you and your health needs are our priority.  As part of our continuing mission to provide you with exceptional heart care, we have created designated Provider Care Teams.  These Care Teams include your primary Cardiologist (physician) and Advanced Practice Providers (APPs -  Physician Assistants and Nurse Practitioners) who all work together to provide you with the care you need, when you need it.  We recommend signing up for the patient portal called "MyChart".  Sign up information is provided on this After Visit Summary.  MyChart is used to connect with patients for Virtual Visits (Telemedicine).  Patients are able to view lab/test results, encounter notes, upcoming appointments, etc.  Non-urgent messages can be sent to your provider as well.   To learn more about what you can do with MyChart, go to [email or web address].    Your next appointment:   1 year(s)  The format for your next appointment:   In Person  Provider:   Fransico Him, MD

## 2019-09-11 DIAGNOSIS — Z8679 Personal history of other diseases of the circulatory system: Secondary | ICD-10-CM | POA: Diagnosis not present

## 2019-09-11 DIAGNOSIS — R7303 Prediabetes: Secondary | ICD-10-CM | POA: Diagnosis not present

## 2019-09-29 ENCOUNTER — Other Ambulatory Visit: Payer: Self-pay

## 2019-09-29 ENCOUNTER — Ambulatory Visit (HOSPITAL_COMMUNITY): Payer: Medicare Other | Attending: Cardiovascular Disease

## 2019-09-29 DIAGNOSIS — B3323 Viral pericarditis: Secondary | ICD-10-CM | POA: Diagnosis not present

## 2019-11-28 ENCOUNTER — Other Ambulatory Visit: Payer: Self-pay | Admitting: Cardiology

## 2019-12-18 DIAGNOSIS — H43813 Vitreous degeneration, bilateral: Secondary | ICD-10-CM | POA: Diagnosis not present

## 2019-12-18 DIAGNOSIS — H04123 Dry eye syndrome of bilateral lacrimal glands: Secondary | ICD-10-CM | POA: Diagnosis not present

## 2019-12-18 DIAGNOSIS — H40013 Open angle with borderline findings, low risk, bilateral: Secondary | ICD-10-CM | POA: Diagnosis not present

## 2019-12-18 DIAGNOSIS — Z961 Presence of intraocular lens: Secondary | ICD-10-CM | POA: Diagnosis not present

## 2020-03-08 DIAGNOSIS — Z23 Encounter for immunization: Secondary | ICD-10-CM | POA: Diagnosis not present

## 2020-04-08 DIAGNOSIS — I10 Essential (primary) hypertension: Secondary | ICD-10-CM | POA: Diagnosis not present

## 2020-04-08 DIAGNOSIS — Z8679 Personal history of other diseases of the circulatory system: Secondary | ICD-10-CM | POA: Diagnosis not present

## 2020-04-08 DIAGNOSIS — I4891 Unspecified atrial fibrillation: Secondary | ICD-10-CM | POA: Diagnosis not present

## 2020-04-08 DIAGNOSIS — R7303 Prediabetes: Secondary | ICD-10-CM | POA: Diagnosis not present

## 2020-09-02 DIAGNOSIS — Z23 Encounter for immunization: Secondary | ICD-10-CM | POA: Diagnosis not present

## 2020-10-08 ENCOUNTER — Encounter: Payer: Self-pay | Admitting: Cardiology

## 2020-10-08 ENCOUNTER — Other Ambulatory Visit: Payer: Self-pay

## 2020-10-08 ENCOUNTER — Ambulatory Visit (INDEPENDENT_AMBULATORY_CARE_PROVIDER_SITE_OTHER): Payer: Medicare Other | Admitting: Cardiology

## 2020-10-08 VITALS — BP 142/88 | HR 72 | Ht 66.0 in | Wt 241.4 lb

## 2020-10-08 DIAGNOSIS — I4891 Unspecified atrial fibrillation: Secondary | ICD-10-CM

## 2020-10-08 DIAGNOSIS — B3323 Viral pericarditis: Secondary | ICD-10-CM

## 2020-10-08 DIAGNOSIS — I447 Left bundle-branch block, unspecified: Secondary | ICD-10-CM | POA: Diagnosis not present

## 2020-10-08 DIAGNOSIS — R600 Localized edema: Secondary | ICD-10-CM | POA: Diagnosis not present

## 2020-10-08 DIAGNOSIS — I9789 Other postprocedural complications and disorders of the circulatory system, not elsewhere classified: Secondary | ICD-10-CM | POA: Diagnosis not present

## 2020-10-08 DIAGNOSIS — I10 Essential (primary) hypertension: Secondary | ICD-10-CM

## 2020-10-08 MED ORDER — METOPROLOL SUCCINATE ER 25 MG PO TB24: 25.0000 mg | ORAL_TABLET | Freq: Every day | ORAL | 3 refills | Status: DC

## 2020-10-08 NOTE — Progress Notes (Signed)
Cardiology Office Note:    Date:  10/08/2020   ID:  RYE DORADO, DOB 01-17-1941, MRN [id number]  PCP:  Aretta Nip, MD  Cardiologist:  None    Referring MD: Aretta Nip, MD   Chief Complaint  Patient presents with  . Follow-up    Pericarditis, post op afib, LBBB, HTN, LE edema    History of Present Illness:    Barbara Nelson is a 80 y.o. female with a hx of viral pericarditis with tamponade s/p pericardial window and anterior partial pericardiectomy in 2013 with post op atrial fibrillation. She also has a chronic LBBB.She is here today for followup and is doing well.  She denies any chest pain or pressure, SOB, DOE (except if she eats salty food and doesn't exercise for a while), PND, orthopnea,  dizziness, palpitations or syncope. Occasionally she will have some mild Left ankle edema.  She is compliant with her meds and is tolerating meds with no SE.    Past Medical History:  Diagnosis Date  . Benign essential HTN 10/31/2015  . Cardiac tamponade due to viral pericarditis   . LBBB (left bundle branch block)   . Lung mass    fungal infection s/p resection at [facility]  . Postoperative atrial fibrillation ([place])   . Pulmonary nodule     Past Surgical History:  Procedure Laterality Date  . ABDOMINAL HYSTERECTOMY  1988  . chest tube placement  09/03/11  . LUNG REMOVAL, PARTIAL    . right knee arthoscopy with meniscal repair    . subxiphoid pericardial window and drainage anterior partial pericardiectomy  09/03/11    Current Medications: Current Meds  Medication Sig  . hydrochlorothiazide (HYDRODIURIL) 12.5 MG tablet Take 12.5 mg by mouth every morning.  Marland Kitchen ibuprofen (ADVIL,MOTRIN) 400 MG tablet Take 400 mg by mouth every 6 (six) hours as needed.  . metoprolol succinate (TOPROL XL) 25 MG 24 hr tablet Take 1 tablet (25 mg total) by mouth daily.  Vladimir Faster Glycol-Propyl Glycol (SYSTANE) 0.4-0.3 % SOLN Place 1 drop into both eyes See admin instructions. 3  times daily  . [DISCONTINUED] Polyvinyl Alcohol-Povidone (REFRESH OP) Apply 1 drop to eye 3 (three) times daily.     Allergies:   Latex, Penicillins, Tape, Z-pak [azithromycin], Amoxicillin, and Sulfa antibiotics   Social History   Socioeconomic History  . Marital status: Single    Spouse name: Not on file  . Number of children: 4  . Years of education: Not on file  . Highest education level: Not on file  Occupational History  . Occupation: unemployed Therapist, sports retired  Tobacco Use  . Smoking status: Former Smoker    Packs/day: 1.50    Years: 10.00    Pack years: 15.00    Types: Cigarettes    Quit date: 05/19/1979    Years since quitting: 41.4  . Smokeless tobacco: Never Used  Vaping Use  . Vaping Use: Never used  Substance and Sexual Activity  . Alcohol use: No  . Drug use: No  . Sexual activity: Not on file  Other Topics Concern  . Not on file  Social History Narrative  . Not on file   Social Determinants of Health   Financial Resource Strain: Not on file  Food Insecurity: Not on file  Transportation Needs: Not on file  Physical Activity: Not on file  Stress: Not on file  Social Connections: Not on file     Family History: The patient's family history includes Hypertension in  her father, mother, and sister; Leukemia in her maternal aunt; Ovarian cancer in her mother; Prostate cancer in her brother; Stroke in her father; Thyroid disease in her sister.  ROS:   Please see the history of present illness.    ROS  All other systems reviewed and negative.   EKGs/Labs/Other Studies Reviewed:    The following studies were reviewed today: none  EKG:  EKG is  ordered today.  The ekg ordered today demonstrates NSR with LBBB Recent Labs: No results found for requested labs within last 8760 hours.   Recent Lipid Panel No results found for: CHOL, TRIG, HDL, CHOLHDL, VLDL, LDLCALC, LDLDIRECT  Physical Exam:    VS:  BP (!) 142/88   Pulse 72   Ht 5\' 6"  (1.676 m)   Wt 241  lb 6.4 oz (109.5 kg)   LMP  (LMP Unknown)   BMI 38.96 kg/m     Wt Readings from Last 3 Encounters:  10/08/20 241 lb 6.4 oz (109.5 kg)  09/08/19 239 lb (108.4 kg)  10/03/18 240 lb (108.9 kg)     GEN: Well nourished, well developed in no acute distress HEENT: Normal NECK: No JVD; No carotid bruits LYMPHATICS: No lymphadenopathy CARDIAC:RRR, no murmurs, rubs, gallops RESPIRATORY:  Clear to auscultation without rales, wheezing or rhonchi  ABDOMEN: Soft, non-tender, non-distended MUSCULOSKELETAL:  No edema; No deformity  SKIN: Warm and dry NEUROLOGIC:  Alert and oriented x 3 PSYCHIATRIC:  Normal affect    ASSESSMENT:    1. Viral pericarditis, unspecified chronicity   2. Postoperative atrial fibrillation (HCC)   3. LBBB (left bundle branch block)   4. Benign essential HTN   5. Edema of extremities    PLAN:    In order of problems listed above:  1.  History of viral pericarditis  -this was complicated by tamponade requiring pericardial window and anterior partial pericardectomy in 2013. -repeat echo 09/2019 showed no pericardial effusion  2.  Postoperative atrial fibrillation -this was after pericardial window.   -She was not placed on chronic anticoagulation given the acute nature in the setting of pericarditis.   -she denies any palpitations and has been maintaining NSR -Continue prescription drug management with Toprol XL 25mg  daily  3.  Chronic left bundle branch block.   -she denies any anginal symptoms -Nuclear stress test in 2013 showed no ischemia   4.  Hypertension  -BP is borderline controlled on exam today -Continue prescription drug management with Toprol XL 25mg  daily >>refilled for 1 year  5.  Chronic lower extremity edema  -she occasionally has some mild left ankle edema -she will continue on HCTZ -continue low Na diet   Medication Adjustments/Labs and Tests Ordered: Current medicines are reviewed at length with the patient today.  Concerns  regarding medicines are outlined above.  Orders Placed This Encounter  Procedures  . EKG 12-Lead   No orders of the defined types were placed in this encounter.   Signed, Fransico Him, MD  10/08/2020 8:40 AM    [place]

## 2020-10-08 NOTE — Patient Instructions (Addendum)

## 2020-10-08 NOTE — Addendum Note (Signed)
Addended by: Antonieta Iba on: 10/08/2020 08:45 AM   Modules accepted: Orders

## 2020-11-11 DIAGNOSIS — Z6841 Body Mass Index (BMI) 40.0 and over, adult: Secondary | ICD-10-CM | POA: Diagnosis not present

## 2020-11-11 DIAGNOSIS — M25562 Pain in left knee: Secondary | ICD-10-CM | POA: Diagnosis not present

## 2020-11-11 DIAGNOSIS — M7989 Other specified soft tissue disorders: Secondary | ICD-10-CM | POA: Diagnosis not present

## 2020-11-11 DIAGNOSIS — R7309 Other abnormal glucose: Secondary | ICD-10-CM | POA: Diagnosis not present

## 2020-11-11 DIAGNOSIS — I10 Essential (primary) hypertension: Secondary | ICD-10-CM | POA: Diagnosis not present

## 2020-11-12 ENCOUNTER — Ambulatory Visit
Admission: RE | Admit: 2020-11-12 | Discharge: 2020-11-12 | Disposition: A | Discharge status: Home / Self Care | Payer: Medicare Other | Source: Ambulatory Visit | Attending: Home Modifications | Admitting: Home Modifications

## 2020-11-12 ENCOUNTER — Other Ambulatory Visit: Payer: Self-pay | Admitting: Home Modifications

## 2020-11-12 ENCOUNTER — Other Ambulatory Visit (HOSPITAL_COMMUNITY): Payer: Self-pay | Admitting: Home Modifications

## 2020-11-12 DIAGNOSIS — M25562 Pain in left knee: Secondary | ICD-10-CM

## 2020-11-12 DIAGNOSIS — R609 Edema, unspecified: Secondary | ICD-10-CM

## 2020-11-13 ENCOUNTER — Other Ambulatory Visit: Payer: Self-pay

## 2020-11-13 ENCOUNTER — Ambulatory Visit (HOSPITAL_COMMUNITY): Admission: RE | Admit: 2020-11-13 | Payer: Medicare Other | Source: Ambulatory Visit

## 2020-11-13 ENCOUNTER — Ambulatory Visit (HOSPITAL_COMMUNITY)
Admission: RE | Admit: 2020-11-13 | Discharge: 2020-11-13 | Disposition: A | Discharge status: Home / Self Care | Payer: Medicare Other | Source: Ambulatory Visit | Attending: Home Modifications | Admitting: Home Modifications

## 2020-11-13 DIAGNOSIS — R609 Edema, unspecified: Secondary | ICD-10-CM

## 2020-11-13 NOTE — Progress Notes (Signed)
LLE venous duplex has been completed.  Results can be found under chart review under CV PROC. 11/13/2020 11:23 AM Deno Sida RVT, RDMS

## 2020-12-10 DIAGNOSIS — Z0000 Encounter for general adult medical examination without abnormal findings: Secondary | ICD-10-CM | POA: Diagnosis not present

## 2020-12-10 DIAGNOSIS — Z23 Encounter for immunization: Secondary | ICD-10-CM | POA: Diagnosis not present

## 2020-12-10 DIAGNOSIS — Z8679 Personal history of other diseases of the circulatory system: Secondary | ICD-10-CM | POA: Diagnosis not present

## 2020-12-10 DIAGNOSIS — I4891 Unspecified atrial fibrillation: Secondary | ICD-10-CM | POA: Diagnosis not present

## 2020-12-10 DIAGNOSIS — R7303 Prediabetes: Secondary | ICD-10-CM | POA: Diagnosis not present

## 2020-12-10 DIAGNOSIS — I10 Essential (primary) hypertension: Secondary | ICD-10-CM | POA: Diagnosis not present

## 2020-12-10 DIAGNOSIS — M8588 Other specified disorders of bone density and structure, other site: Secondary | ICD-10-CM | POA: Diagnosis not present

## 2020-12-13 ENCOUNTER — Other Ambulatory Visit: Payer: Self-pay | Admitting: Family Medicine

## 2020-12-13 DIAGNOSIS — E2839 Other primary ovarian failure: Secondary | ICD-10-CM

## 2020-12-13 DIAGNOSIS — Z1231 Encounter for screening mammogram for malignant neoplasm of breast: Secondary | ICD-10-CM

## 2020-12-16 DIAGNOSIS — H40003 Preglaucoma, unspecified, bilateral: Secondary | ICD-10-CM | POA: Diagnosis not present

## 2020-12-16 DIAGNOSIS — H04123 Dry eye syndrome of bilateral lacrimal glands: Secondary | ICD-10-CM | POA: Diagnosis not present

## 2020-12-16 DIAGNOSIS — Z961 Presence of intraocular lens: Secondary | ICD-10-CM | POA: Diagnosis not present

## 2020-12-16 DIAGNOSIS — H43813 Vitreous degeneration, bilateral: Secondary | ICD-10-CM | POA: Diagnosis not present

## 2020-12-16 DIAGNOSIS — H26493 Other secondary cataract, bilateral: Secondary | ICD-10-CM | POA: Diagnosis not present

## 2021-02-07 DIAGNOSIS — Z23 Encounter for immunization: Secondary | ICD-10-CM | POA: Diagnosis not present

## 2021-03-17 DIAGNOSIS — H16223 Keratoconjunctivitis sicca, not specified as Sjogren's, bilateral: Secondary | ICD-10-CM | POA: Diagnosis not present

## 2021-03-25 DIAGNOSIS — L821 Other seborrheic keratosis: Secondary | ICD-10-CM | POA: Diagnosis not present

## 2021-03-25 DIAGNOSIS — L811 Chloasma: Secondary | ICD-10-CM | POA: Diagnosis not present

## 2021-06-04 DIAGNOSIS — L820 Inflamed seborrheic keratosis: Secondary | ICD-10-CM | POA: Diagnosis not present

## 2021-06-11 DIAGNOSIS — I10 Essential (primary) hypertension: Secondary | ICD-10-CM | POA: Diagnosis not present

## 2021-06-11 DIAGNOSIS — R7303 Prediabetes: Secondary | ICD-10-CM | POA: Diagnosis not present

## 2021-06-11 DIAGNOSIS — E669 Obesity, unspecified: Secondary | ICD-10-CM | POA: Diagnosis not present

## 2021-06-11 DIAGNOSIS — Z8679 Personal history of other diseases of the circulatory system: Secondary | ICD-10-CM | POA: Diagnosis not present

## 2021-06-16 ENCOUNTER — Ambulatory Visit
Admission: RE | Admit: 2021-06-16 | Discharge: 2021-06-16 | Disposition: A | Discharge status: Home / Self Care | Payer: Medicare Other | Source: Ambulatory Visit | Attending: Family Medicine | Admitting: Family Medicine

## 2021-06-16 DIAGNOSIS — Z780 Asymptomatic menopausal state: Secondary | ICD-10-CM | POA: Diagnosis not present

## 2021-06-16 DIAGNOSIS — M85851 Other specified disorders of bone density and structure, right thigh: Secondary | ICD-10-CM | POA: Diagnosis not present

## 2021-06-16 DIAGNOSIS — Z1231 Encounter for screening mammogram for malignant neoplasm of breast: Secondary | ICD-10-CM | POA: Diagnosis not present

## 2021-06-16 DIAGNOSIS — E2839 Other primary ovarian failure: Secondary | ICD-10-CM

## 2021-06-17 ENCOUNTER — Other Ambulatory Visit: Payer: Self-pay | Admitting: Family Medicine

## 2021-06-17 DIAGNOSIS — R928 Other abnormal and inconclusive findings on diagnostic imaging of breast: Secondary | ICD-10-CM

## 2021-07-11 ENCOUNTER — Other Ambulatory Visit: Payer: Self-pay | Admitting: Family Medicine

## 2021-07-11 ENCOUNTER — Ambulatory Visit
Admission: RE | Admit: 2021-07-11 | Discharge: 2021-07-11 | Disposition: A | Discharge status: Home / Self Care | Payer: Medicare Other | Source: Ambulatory Visit | Attending: Family Medicine | Admitting: Family Medicine

## 2021-07-11 ENCOUNTER — Other Ambulatory Visit: Payer: Self-pay

## 2021-07-11 DIAGNOSIS — R928 Other abnormal and inconclusive findings on diagnostic imaging of breast: Secondary | ICD-10-CM

## 2021-07-11 DIAGNOSIS — R922 Inconclusive mammogram: Secondary | ICD-10-CM | POA: Diagnosis not present

## 2021-07-11 DIAGNOSIS — R921 Mammographic calcification found on diagnostic imaging of breast: Secondary | ICD-10-CM

## 2021-10-09 ENCOUNTER — Other Ambulatory Visit: Payer: Self-pay | Admitting: Cardiology

## 2021-11-04 DIAGNOSIS — S76312A Strain of muscle, fascia and tendon of the posterior muscle group at thigh level, left thigh, initial encounter: Secondary | ICD-10-CM | POA: Diagnosis not present

## 2021-11-13 NOTE — Progress Notes (Signed)
Cardiology Office Note:    Date:  11/14/2021   ID:  LAVILLA Nelson, DOB Jul 03, 1940, MRN [id number]  PCP:  Aretta Nip, MD  Cardiologist:  None    Referring MD: Aretta Nip, MD   Chief Complaint  Patient presents with   Follow-up    Pericarditis, afib, LBBB, HTN, LE edema    History of Present Illness:    Barbara Nelson is a 81 y.o. female with a hx of viral pericarditis with tamponade s/p pericardial window and anterior partial pericardiectomy in 2013 with post op atrial fibrillation. She also has a chronic LBBB.   She is here today for followup and is doing well.  She denies any chest pain or pressure, SOB, DOE (except walking up hills), PND, orthopnea, dizziness, palpitations or syncope. Occasionally she will have some mild left ankle edema. She is compliant with her meds and is tolerating meds with no SE.     Past Medical History:  Diagnosis Date   Benign essential HTN 10/31/2015   Cardiac tamponade due to viral pericarditis    LBBB (left bundle branch block)    Lung mass    fungal infection s/p resection at [facility]   Postoperative atrial fibrillation [facility])    Pulmonary nodule     Past Surgical History:  Procedure Laterality Date   ABDOMINAL HYSTERECTOMY  1988   chest tube placement  09/03/11   LUNG REMOVAL, PARTIAL     right knee arthoscopy with meniscal repair     subxiphoid pericardial window and drainage anterior partial pericardiectomy  09/03/11    Current Medications: Current Meds  Medication Sig   hydrochlorothiazide (HYDRODIURIL) 12.5 MG tablet Take 12.5 mg by mouth every morning.   ibuprofen (ADVIL,MOTRIN) 400 MG tablet Take 400 mg by mouth every 6 (six) hours as needed.   metoprolol succinate (TOPROL-XL) 25 MG 24 hr tablet TAKE 1 TABLET (25 MG TOTAL) BY MOUTH DAILY.   Polyethyl Glycol-Propyl Glycol (SYSTANE) 0.4-0.3 % SOLN Place 1 drop into both eyes See admin instructions. 3 times daily     Allergies:   Latex, Penicillins, Tape, Z-pak  [azithromycin], Amoxicillin, and Sulfa antibiotics   Social History   Socioeconomic History   Marital status: Single    Spouse name: Not on file   Number of children: 4   Years of education: Not on file   Highest education level: Not on file  Occupational History   Occupation: unemployed Therapist, sports retired  Tobacco Use   Smoking status: Former    Packs/day: 1.50    Years: 10.00    Total pack years: 15.00    Types: Cigarettes    Quit date: 05/19/1979    Years since quitting: 42.5   Smokeless tobacco: Never  Vaping Use   Vaping Use: Never used  Substance and Sexual Activity   Alcohol use: No   Drug use: No   Sexual activity: Not on file  Other Topics Concern   Not on file  Social History Narrative   Not on file   Social Determinants of Health   Financial Resource Strain: Not on file  Food Insecurity: Not on file  Transportation Needs: Not on file  Physical Activity: Not on file  Stress: Not on file  Social Connections: Not on file     Family History: The patient's family history includes Hypertension in her father, mother, and sister; Leukemia in her maternal aunt; Ovarian cancer in her mother; Prostate cancer in her brother; Stroke in her father; Thyroid disease  in her sister.  ROS:   Please see the history of present illness.    ROS  All other systems reviewed and negative.   EKGs/Labs/Other Studies Reviewed:    The following studies were reviewed today: none  EKG:  EKG is  ordered today.  The ekg ordered today demonstrates NSR with RAE, nonspecific IVCD, anterior infarct age undetermined Recent Labs: No results found for requested labs within last 365 days.   Recent Lipid Panel No results found for: "CHOL", "TRIG", "HDL", "CHOLHDL", "VLDL", "LDLCALC", "LDLDIRECT"  Physical Exam:    VS:  BP 128/74   Pulse 87   Ht 5\' 6"  (1.676 m)   Wt 233 lb 6.4 oz (105.9 kg)   LMP  (LMP Unknown)   SpO2 98%   BMI 37.67 kg/m     Wt Readings from Last 3 Encounters:   11/14/21 233 lb 6.4 oz (105.9 kg)  10/08/20 241 lb 6.4 oz (109.5 kg)  09/08/19 239 lb (108.4 kg)     GEN: Well nourished, well developed in no acute distress HEENT: Normal NECK: No JVD; No carotid bruits LYMPHATICS: No lymphadenopathy CARDIAC:RRR, no murmurs, rubs, gallops RESPIRATORY:  Clear to auscultation without rales, wheezing or rhonchi  ABDOMEN: Soft, non-tender, non-distended MUSCULOSKELETAL:  No edema; No deformity  SKIN: Warm and dry NEUROLOGIC:  Alert and oriented x 3 PSYCHIATRIC:  Normal affect    ASSESSMENT:    1. Viral pericarditis, unspecified chronicity   2. Postoperative atrial fibrillation (HCC)   3. LBBB (left bundle branch block)   4. Benign essential HTN   5. Edema of extremities    PLAN:    In order of problems listed above:  1.  History of viral pericarditis  -this was complicated by tamponade requiring pericardial window and anterior partial pericardectomy in 2013. -repeat echo 09/2019 showed no pericardial effusion   2.  Postoperative atrial fibrillation -this was after pericardial window.   -She was not placed on chronic anticoagulation given the acute nature in the setting of pericarditis.   -she is maintaining NSR on exam and denies any palpitations -continue prescription drug management with Toprol XL 25mg  daily with PRN refills   3.  Chronic left bundle branch block.   -she denies any anginal symptoms -Nuclear stress test in 2013 showed no ischemia    4.  Hypertension  -BP controlled on exam today -continue prescription drug management with Toprol XL 25mg  daily  and HCTZ 12.5mg  daily with PRN refills -I have personally reviewed and interpreted outside labs performed by patient's PCP which showed SCr 0.96 and K+ 4.5 in Jan 2023   5.  Chronic lower extremity edema  -she occasionally has some mild left ankle edema -continue prescription drug management with HCTZ 12.5mg  daily with PRN refills.  -continue low Na diet   Medication  Adjustments/Labs and Tests Ordered: Current medicines are reviewed at length with the patient today.  Concerns regarding medicines are outlined above.  Orders Placed This Encounter  Procedures   EKG 12-Lead   No orders of the defined types were placed in this encounter.   Signed, Fransico Him, MD  11/14/2021 9:55 AM    [place]

## 2021-11-14 ENCOUNTER — Ambulatory Visit (INDEPENDENT_AMBULATORY_CARE_PROVIDER_SITE_OTHER): Payer: Medicare Other | Admitting: Cardiology

## 2021-11-14 ENCOUNTER — Encounter: Payer: Self-pay | Admitting: Cardiology

## 2021-11-14 VITALS — BP 128/74 | HR 87 | Ht 66.0 in | Wt 233.4 lb

## 2021-11-14 DIAGNOSIS — I10 Essential (primary) hypertension: Secondary | ICD-10-CM | POA: Diagnosis not present

## 2021-11-14 DIAGNOSIS — R600 Localized edema: Secondary | ICD-10-CM | POA: Diagnosis not present

## 2021-11-14 DIAGNOSIS — B3323 Viral pericarditis: Secondary | ICD-10-CM

## 2021-11-14 DIAGNOSIS — I447 Left bundle-branch block, unspecified: Secondary | ICD-10-CM | POA: Diagnosis not present

## 2021-11-14 DIAGNOSIS — I9789 Other postprocedural complications and disorders of the circulatory system, not elsewhere classified: Secondary | ICD-10-CM

## 2021-11-14 DIAGNOSIS — I4891 Unspecified atrial fibrillation: Secondary | ICD-10-CM

## 2021-11-14 NOTE — Patient Instructions (Signed)

## 2021-11-24 DIAGNOSIS — H26493 Other secondary cataract, bilateral: Secondary | ICD-10-CM | POA: Diagnosis not present

## 2021-11-24 DIAGNOSIS — H43813 Vitreous degeneration, bilateral: Secondary | ICD-10-CM | POA: Diagnosis not present

## 2021-11-24 DIAGNOSIS — Z961 Presence of intraocular lens: Secondary | ICD-10-CM | POA: Diagnosis not present

## 2021-11-24 DIAGNOSIS — H16223 Keratoconjunctivitis sicca, not specified as Sjogren's, bilateral: Secondary | ICD-10-CM | POA: Diagnosis not present

## 2021-12-15 DIAGNOSIS — Z6839 Body mass index (BMI) 39.0-39.9, adult: Secondary | ICD-10-CM | POA: Diagnosis not present

## 2021-12-15 DIAGNOSIS — R7309 Other abnormal glucose: Secondary | ICD-10-CM | POA: Diagnosis not present

## 2021-12-15 DIAGNOSIS — M7122 Synovial cyst of popliteal space [Baker], left knee: Secondary | ICD-10-CM | POA: Diagnosis not present

## 2021-12-15 DIAGNOSIS — Z1389 Encounter for screening for other disorder: Secondary | ICD-10-CM | POA: Diagnosis not present

## 2021-12-15 DIAGNOSIS — Z01419 Encounter for gynecological examination (general) (routine) without abnormal findings: Secondary | ICD-10-CM | POA: Diagnosis not present

## 2021-12-15 DIAGNOSIS — I10 Essential (primary) hypertension: Secondary | ICD-10-CM | POA: Diagnosis not present

## 2021-12-15 DIAGNOSIS — R7989 Other specified abnormal findings of blood chemistry: Secondary | ICD-10-CM | POA: Diagnosis not present

## 2021-12-15 DIAGNOSIS — Z0000 Encounter for general adult medical examination without abnormal findings: Secondary | ICD-10-CM | POA: Diagnosis not present

## 2021-12-15 DIAGNOSIS — Z8679 Personal history of other diseases of the circulatory system: Secondary | ICD-10-CM | POA: Diagnosis not present

## 2021-12-15 DIAGNOSIS — I4891 Unspecified atrial fibrillation: Secondary | ICD-10-CM | POA: Diagnosis not present

## 2021-12-18 ENCOUNTER — Ambulatory Visit: Payer: Self-pay

## 2021-12-18 NOTE — Patient Outreach (Signed)
  Care Coordination   12/18/2021 Name: Barbara Nelson MRN: [id number] DOB: 26-May-1940   Care Coordination Outreach Attempts:  An unsuccessful telephone outreach was attempted today to offer the patient information about available care coordination services as a benefit of their health plan.   Follow Up Plan:  Additional outreach attempts will be made to offer the patient care coordination information and services.   Encounter Outcome:  No Answer  Care Coordination Interventions Activated:  No   Care Coordination Interventions:  No, not indicated    [place] [facility] [phone]

## 2022-01-03 ENCOUNTER — Other Ambulatory Visit: Payer: Self-pay | Admitting: Cardiology

## 2022-01-05 DIAGNOSIS — Z8679 Personal history of other diseases of the circulatory system: Secondary | ICD-10-CM | POA: Insufficient documentation

## 2022-01-05 DIAGNOSIS — H04123 Dry eye syndrome of bilateral lacrimal glands: Secondary | ICD-10-CM | POA: Insufficient documentation

## 2022-01-05 DIAGNOSIS — Z6839 Body mass index (BMI) 39.0-39.9, adult: Secondary | ICD-10-CM | POA: Insufficient documentation

## 2022-01-05 DIAGNOSIS — E669 Obesity, unspecified: Secondary | ICD-10-CM | POA: Insufficient documentation

## 2022-01-05 DIAGNOSIS — E2839 Other primary ovarian failure: Secondary | ICD-10-CM | POA: Insufficient documentation

## 2022-01-05 DIAGNOSIS — I10 Essential (primary) hypertension: Secondary | ICD-10-CM | POA: Insufficient documentation

## 2022-01-05 DIAGNOSIS — R7303 Prediabetes: Secondary | ICD-10-CM | POA: Insufficient documentation

## 2022-01-07 ENCOUNTER — Ambulatory Visit: Payer: Self-pay

## 2022-01-07 NOTE — Patient Outreach (Signed)
  Care Coordination   01/07/2022 Name: Barbara Nelson MRN: [id number] DOB: 07-21-1940   Care Coordination Outreach Attempts:  An unsuccessful telephone outreach was attempted today to offer the patient information about available care coordination services as a benefit of their health plan.   Follow Up Plan:  Additional outreach attempts will be made to offer the patient care coordination information and services.   Encounter Outcome:  No Answer  Care Coordination Interventions Activated:  No   Care Coordination Interventions:  No, not indicated    [place] [facility] [phone]

## 2022-01-09 ENCOUNTER — Other Ambulatory Visit: Payer: Self-pay | Admitting: Family Medicine

## 2022-01-09 ENCOUNTER — Encounter: Payer: Self-pay | Admitting: Registered"

## 2022-01-09 ENCOUNTER — Ambulatory Visit
Admission: RE | Admit: 2022-01-09 | Discharge: 2022-01-09 | Disposition: A | Discharge status: Home / Self Care | Payer: Medicare Other | Source: Ambulatory Visit | Attending: Family Medicine | Admitting: Family Medicine

## 2022-01-09 ENCOUNTER — Encounter: Payer: Medicare Other | Attending: Family Medicine | Admitting: Registered"

## 2022-01-09 DIAGNOSIS — R921 Mammographic calcification found on diagnostic imaging of breast: Secondary | ICD-10-CM | POA: Diagnosis not present

## 2022-01-09 DIAGNOSIS — R7303 Prediabetes: Secondary | ICD-10-CM | POA: Insufficient documentation

## 2022-01-09 NOTE — Progress Notes (Signed)
On 01/09/2022 patient completed Core Session 1 of Diabetes Prevention Program course virtually with [facility]. The following learning objectives were met by the patient during this class:   Virtual Visit via Video Note  I connected with Barton Fanny. Ahmad by a video enabled application and verified that I am speaking with the correct person using two identifiers.   I discussed the limitations of evaluation and management by telemedicine and the availability of in person appointments. The patient expressed understanding and agreed to proceed.  Location: Patient: home Provider: office   Learning Objectives:  Be able to explain the purpose and benefits of the National Diabetes Prevention Program.  Be able to describe the events that will take place at every session.  Know the weight loss and physical activity goals established by the [facility] Diabetes Prevention Program.  Know their own individual weight loss and physical activity goals.  Be able to explain the important effect of self-monitoring on behavior change.   Goals:  Record food and beverage intake in "Food and Activity Tracker" over the next week.  E-mail completed "Food and Activity Tracker" to Lifestyle Coach next week before session 2. Circle the foods or beverages you think are highest in fat and calories in your food tracker. Read the labels on the food you buy, and consider using measuring cups and spoons to help you calculate the amount you eat. We will talk about measuring in more detail in the coming weeks.   Follow-Up Plan: Attend Core Session 2 next week.  E-mail completed "Food and Activity Tracker" to Lifestyle Coach next week before class.

## 2022-01-16 ENCOUNTER — Encounter: Payer: Self-pay | Admitting: Registered"

## 2022-01-16 ENCOUNTER — Encounter: Payer: Medicare Other | Attending: Family Medicine | Admitting: Registered"

## 2022-01-16 DIAGNOSIS — R7303 Prediabetes: Secondary | ICD-10-CM | POA: Insufficient documentation

## 2022-01-16 NOTE — Progress Notes (Signed)
On 01/16/22 patient completed Core Session 2 of Diabetes Prevention Program course virtually with [facility]. The following learning objectives were met by the patient during this class:   Virtual Visit via Video Note  I connected with Barbara Nelson by a video enabled application and verified that I am speaking with the correct person using two identifiers.   I discussed the limitations of evaluation and management by telemedicine and the availability of in person appointments. The patient expressed understanding and agreed to proceed.  Location: Patient: Home.  Provider: Office.   Learning Objectives: Self-monitor their weight during the weeks following Session 2.  Describe the relationship between fat and calories.  Explain the reason for, and basic principles of, self-monitoring fat grams and calories.  Identify their personal fat gram goals.  Use the ?Fat and Calorie Counter to calculate the calories and fat grams of a given selection of foods.  Keep a running total of the fat grams they eat each day.  Calculate fat, calories, and serving sizes from nutrition labels.   Goals:  Weigh yourself at the same time each day, or every few days, and record your weight in your Food and Activity Tracker. Write down everything you eat and drink in your Food and Activity Tracker. Measure portions as much as you can, and start reading labels.  Use the ?Fat and Calorie Counter to figure out the amount of fat and calories in what you ate, and write the amount down in your Food and Activity Tracker. Keep a running fat gram total throughout the day. Come as close to your fat gram goal as you can.   Follow-Up Plan: Attend Core Session 3 next week.  Email completed  "Food and Activity Tracker" to Lifestyle Coach next week.

## 2022-01-23 ENCOUNTER — Encounter: Payer: Medicare Other | Attending: Family Medicine | Admitting: Registered"

## 2022-01-23 DIAGNOSIS — R7303 Prediabetes: Secondary | ICD-10-CM | POA: Insufficient documentation

## 2022-01-29 ENCOUNTER — Encounter: Payer: Self-pay | Admitting: Registered"

## 2022-01-29 NOTE — Progress Notes (Signed)
On 01/23/22 patient completed Core Session 3 of Diabetes Prevention Program course virtually with [facility]. The following learning objectives were met by the patient during this class:   Virtual Visit via Video Note  I connected with [name] by a video enabled application and verified that I am speaking with the correct person using two identifiers.  Location: Patient: Home.  Provider: Office.   Learning Objectives: Weigh and measure foods. Estimate the fat and calorie content of common foods. Describe three ways to eat less fat and fewer calories. Create a plan to eat less fat for the following week.   Goals:  Track weight when weighing outside of class.  Track food and beverages eaten each day in Food and Activity Tracker and include fat grams and calories for each.  Try to stay within fat gram goal.  Complete plan for eating less high fat foods and answer related homework questions.    Follow-Up Plan: Attend Core Session 4 next week.  Bring completed "Food and Activity Tracker" next week to be reviewed by Lifestyle Coach.

## 2022-01-30 ENCOUNTER — Encounter: Payer: Medicare Other | Attending: Family Medicine | Admitting: Registered"

## 2022-01-30 DIAGNOSIS — R7303 Prediabetes: Secondary | ICD-10-CM

## 2022-02-01 ENCOUNTER — Encounter: Payer: Self-pay | Admitting: Registered"

## 2022-02-01 NOTE — Progress Notes (Signed)
On 01/30/22 patient completed Core Session 4 of Diabetes Prevention Program course virtually with [facility]. The following learning objectives were met by the patient during this class:   Virtual Visit via Video Note  I connected with [name] by a video enabled application and verified that I am speaking with the correct person using two identifiers.  I discussed the limitations of evaluation and management by telemedicine and the availability of in person appointments. The patient expressed understanding and agreed to proceed.  Location: Patient: Home.  Provider: Office.   Learning Objectives: Describe the MyPlate food guide and its recommendations, including how to reduce fat and calories in our diet. Compare and contrast MyPlate guidelines with participants' eating habits. List ways to replace high-fat and high-calorie foods with low-fat and low-calorie foods. Explain the importance of eating plenty of whole grains, vegetables, and fruits, while staying within fat gram goals. Explain the importance of eating foods from all groups of MyPlate and of eating a variety of foods from within each group. Explain why a balanced diet is beneficial to health.  Goals:  Record weight taken outside of class.  Track foods and beverages eaten each day in the "Food and Activity Tracker," including calories and fat grams for each item.  Practice comparing what you eat with the recommendations of MyPlate using the "Rate Your Plate" handout.  Complete the "Rate Your Plate" handout form on at least 3 days.  Answer homework questions.   Follow-Up Plan: Attend Core Session next week.  Email completed "Food and Activity Tracker" next week to be reviewed by Lifestyle Coach.

## 2022-02-06 ENCOUNTER — Encounter: Payer: Medicare Other | Attending: Family Medicine | Admitting: Dietician

## 2022-02-06 ENCOUNTER — Encounter: Payer: Self-pay | Admitting: Dietician

## 2022-02-06 DIAGNOSIS — R7303 Prediabetes: Secondary | ICD-10-CM | POA: Insufficient documentation

## 2022-02-06 NOTE — Progress Notes (Signed)
On 02/06/22 patient completed a post core session of the Diabetes Prevention Program course virtually with [facility]. By the end of this session patients are able to complete the following objectives:  Virtual Visit via Video Note  I connected with Barbara Nelson by a video enabled application and verified that I am speaking with the correct person using two identifiers.  I discussed the limitations of evaluation and management by telemedicine and the availability of in person appointments. The patient expressed understanding and agreed to proceed.  Location:  Patient: Home Provider: Office  Learning Objectives:  Identify which foods contain carbohydrates. List functions for carbohydrates on the body. Describe the relationship between carbohydrate intake and blood sugar. Create balanced snack choices.  Goals:  Record weight taken outside of class. Track foods and beverages eaten each day in the "Food and Activity Tracker," including calories and fat grams for each item.  Track activity type, minutes you were active, and distance you reached each day in the "Food and Activity Tracker."    Follow-Up Plan:  Attend next session. Email completed "Food and Activity Trackers" before next session to be reviewed by Lifestyle Coach.

## 2022-02-11 DIAGNOSIS — M79601 Pain in right arm: Secondary | ICD-10-CM | POA: Diagnosis not present

## 2022-02-11 DIAGNOSIS — Z902 Acquired absence of lung [part of]: Secondary | ICD-10-CM | POA: Diagnosis not present

## 2022-02-11 DIAGNOSIS — B401 Chronic pulmonary blastomycosis: Secondary | ICD-10-CM | POA: Diagnosis not present

## 2022-02-11 DIAGNOSIS — I10 Essential (primary) hypertension: Secondary | ICD-10-CM | POA: Diagnosis not present

## 2022-02-11 DIAGNOSIS — Z6838 Body mass index (BMI) 38.0-38.9, adult: Secondary | ICD-10-CM | POA: Diagnosis not present

## 2022-02-11 DIAGNOSIS — Z8679 Personal history of other diseases of the circulatory system: Secondary | ICD-10-CM | POA: Diagnosis not present

## 2022-02-11 DIAGNOSIS — M6530 Trigger finger, unspecified finger: Secondary | ICD-10-CM | POA: Diagnosis not present

## 2022-02-11 DIAGNOSIS — Z0000 Encounter for general adult medical examination without abnormal findings: Secondary | ICD-10-CM | POA: Diagnosis not present

## 2022-02-11 DIAGNOSIS — I447 Left bundle-branch block, unspecified: Secondary | ICD-10-CM | POA: Diagnosis not present

## 2022-02-13 ENCOUNTER — Encounter: Payer: Medicare Other | Attending: Family Medicine | Admitting: Registered"

## 2022-02-13 ENCOUNTER — Encounter: Payer: Self-pay | Admitting: Registered"

## 2022-02-13 DIAGNOSIS — R7303 Prediabetes: Secondary | ICD-10-CM | POA: Insufficient documentation

## 2022-02-13 NOTE — Progress Notes (Signed)
On 02/13/22 patient completed Core Session 5 of Diabetes Prevention Program course virtually with [facility]. The following learning objectives were met by the patient during this class:   Virtual Visit via Video Note  I connected with Barbara Nelson by a video enabled application and verified that I am speaking with the correct person using two identifiers.   I discussed the limitations of evaluation and management by telemedicine and the availability of in person appointments. The patient expressed understanding and agreed to proceed.  Location: Patient: Home.  Provider: Office.   Learning Objectives: Establish a physical activity goal. Explain the importance of the physical activity goal. Describe their current level of physical activity. Name ways that they are already physically active. Develop personal plans for physical activity for the next week.   Goals:  Record weight taken outside of class.  Track foods and beverages eaten each day in the "Food and Activity Tracker," including calories and fat grams for each item.  Make an Activity Plan including date, specific type of activity, and length of time you plan to be active that includes at last 60 minutes of activity for the week.  Track activity type, minutes you were active, and distance you reached each day in the "Food and Activity Tracker."   Follow-Up Plan: Attend Core Session 6 next week.  E-mail completed "Food and Activity Tracker" to Lifestyle Coach next week before class

## 2022-02-20 ENCOUNTER — Encounter: Payer: Self-pay | Admitting: Registered"

## 2022-02-20 ENCOUNTER — Encounter: Payer: Medicare Other | Attending: Family Medicine | Admitting: Registered"

## 2022-02-20 DIAGNOSIS — R7303 Prediabetes: Secondary | ICD-10-CM | POA: Insufficient documentation

## 2022-02-20 NOTE — Progress Notes (Signed)
On 02/20/22 patient completed Core Session 6 of Diabetes Prevention Program course virtually with [facility]. The following learning objectives were met by the patient during this class:   Virtual Visit via Video Note  I connected with Adahlia Gabriel by a video enabled application and verified that I am speaking with the correct person using two identifiers.   I discussed the limitations of evaluation and management by telemedicine and the availability of in person appointments. The patient expressed understanding and agreed to proceed.   Location: Patient: Home.  Provider: Office.   Learning Objectives: Graph their daily physical activity.  Describe two ways of finding the time to be active.  Define "lifestyle activity."  Describe how to prevent injury.  Develop an activity plan for the coming week.   Goals:  Record weight taken outside of class.  Track foods and beverages eaten each day in the "Food and Activity Tracker," including calories and fat grams for each item.   Track activity type, minutes you were active, and distance you reached each day in the "Food and Activity Tracker."  Set aside one 20 to 30-minute block of time every day or find two or more periods of 10 to15 minutes each for physical activity.  Warm up, cool down, and stretch. Make a Physical Activities Plan for the Week.   Follow-Up Plan: Attend Core Session 7 next week.  E-mail completed "Food and Activity Tracker" to Lifestyle Coach next week before class  

## 2022-02-24 DIAGNOSIS — Z79899 Other long term (current) drug therapy: Secondary | ICD-10-CM | POA: Diagnosis not present

## 2022-02-24 DIAGNOSIS — I480 Paroxysmal atrial fibrillation: Secondary | ICD-10-CM | POA: Diagnosis not present

## 2022-02-24 DIAGNOSIS — I700 Atherosclerosis of aorta: Secondary | ICD-10-CM | POA: Diagnosis not present

## 2022-02-24 DIAGNOSIS — Z7189 Other specified counseling: Secondary | ICD-10-CM | POA: Diagnosis not present

## 2022-02-24 DIAGNOSIS — E559 Vitamin D deficiency, unspecified: Secondary | ICD-10-CM | POA: Diagnosis not present

## 2022-02-24 DIAGNOSIS — R7303 Prediabetes: Secondary | ICD-10-CM | POA: Diagnosis not present

## 2022-02-24 DIAGNOSIS — Z1159 Encounter for screening for other viral diseases: Secondary | ICD-10-CM | POA: Diagnosis not present

## 2022-02-24 DIAGNOSIS — I10 Essential (primary) hypertension: Secondary | ICD-10-CM | POA: Diagnosis not present

## 2022-02-24 DIAGNOSIS — Z0000 Encounter for general adult medical examination without abnormal findings: Secondary | ICD-10-CM | POA: Diagnosis not present

## 2022-02-24 DIAGNOSIS — D6869 Other thrombophilia: Secondary | ICD-10-CM | POA: Diagnosis not present

## 2022-02-24 DIAGNOSIS — Z0001 Encounter for general adult medical examination with abnormal findings: Secondary | ICD-10-CM | POA: Diagnosis not present

## 2022-02-27 ENCOUNTER — Encounter: Payer: Medicare Other | Attending: Family Medicine | Admitting: Registered"

## 2022-02-27 DIAGNOSIS — R7303 Prediabetes: Secondary | ICD-10-CM | POA: Insufficient documentation

## 2022-03-01 ENCOUNTER — Encounter: Payer: Self-pay | Admitting: Registered"

## 2022-03-01 NOTE — Progress Notes (Signed)
On 02/27/22 patient completed Core Session 7 of Diabetes Prevention Program course virtually with [facility]. The following learning objectives were met by the patient during this class:   Virtual Visit via Video Note  I connected with Barbara Nelson by a video enabled application and verified that I am speaking with the correct person using two identifiers.  Location: Patient: Home. Provider: Office.   Learning Objectives: Define calorie balance. Explain how healthy eating and being active are related in terms of calorie balance.  Describe the relationship between calorie balance and weight loss.  Describe his or her progress as it relates to calorie balance.  Develop an activity plan for the coming week.   Goals:  Record weight taken outside of class.  Track foods and beverages eaten each day in the "Food and Activity Tracker," including calories and fat grams for each item.   Track activity type, minutes you were active, and distance you reached each day in the "Food and Activity Tracker."  Set aside one 20 to 30-minute block of time every day or find two or more periods of 10 to15 minutes each for physical activity.  Make a Physical Activities Plan for the Week.  Make active lifestyle choices all through the day  Stay at or go slightly over activity goal.   Follow-Up Plan: Attend Core Session 8.  E-mail completed "Food and Activity Tracker" to Lifestyle Coach next week before class  

## 2022-03-13 ENCOUNTER — Encounter: Payer: Medicare Other | Attending: Family Medicine | Admitting: Registered"

## 2022-03-13 ENCOUNTER — Encounter: Payer: Self-pay | Admitting: Registered"

## 2022-03-13 DIAGNOSIS — R7303 Prediabetes: Secondary | ICD-10-CM | POA: Insufficient documentation

## 2022-03-13 NOTE — Progress Notes (Signed)
On 03/13/22 patient completed Core Session 8 of Diabetes Prevention Program course virtually with [facility]. The following learning objectives were met by the patient during this class:   Virtual Visit via Video Note  I connected with Barbara Nelson by a video enabled application and verified that I am speaking with the correct person using two identifiers.  Location: Patient: Home.  Provider: Office.   Learning Objectives: Recognize positive and negative food and activity cues.  Change negative food and activity cues to positive cues.  Add positive cues for activity and eliminate cues for inactivity.  Develop a plan for removing one problem food cue for the coming week.   Goals:  Record weight taken outside of class.  Track foods and beverages eaten each day in the "Food and Activity Tracker," including calories and fat grams for each item.   Track activity type, minutes you were active, and distance you reached each day in the "Food and Activity Tracker."  Set aside one 20 to 30-minute block of time every day or find two or more periods of 10 to15 minutes each for physical activity.  Remove one problem food cue.  Add one positive cue for being more active.  Follow-Up Plan: Attend Core Session 9 next week.  Email completed "Food and Activity Tracker" next week to be reviewed by Lifestyle Coach.

## 2022-03-20 ENCOUNTER — Encounter: Payer: Medicare Other | Attending: Family Medicine | Admitting: Registered"

## 2022-03-20 DIAGNOSIS — R7303 Prediabetes: Secondary | ICD-10-CM

## 2022-03-23 DIAGNOSIS — Z23 Encounter for immunization: Secondary | ICD-10-CM | POA: Diagnosis not present

## 2022-03-27 ENCOUNTER — Encounter: Payer: Medicare Other | Attending: Family Medicine | Admitting: Registered"

## 2022-03-27 DIAGNOSIS — R7303 Prediabetes: Secondary | ICD-10-CM | POA: Insufficient documentation

## 2022-04-02 ENCOUNTER — Encounter: Payer: Self-pay | Admitting: Registered"

## 2022-04-02 NOTE — Progress Notes (Signed)
On 03/27/22 patient completed Core Session 10 of Diabetes Prevention Program course virtually with [facility]. The following learning objectives were met by the patient during this class:   Virtual Visit via Video Note  I connected with Barbara Nelson by a video enabled application and verified that I am speaking with the correct person using two identifiers.  Location: Patient: Home.  Provider: Office.   Learning Objectives: List and describe the four keys for healthy eating out.  Give examples of how to apply these keys at the type of restaurants that the participants go to regularly.  Make an appropriate meal selection from a restaurant menu.  Demonstrate how to ask for a substitute item using assertive language and a polite tone of voice.    Goals:  Record weight taken outside of class.  Track foods and beverages eaten each day in the "Food and Activity Tracker," including calories and fat grams for each item.   Track activity type, minutes you were active, and distance you reached each day in the "Food and Activity Tracker."  Set aside one 20 to 30-minute block of time every day or find two or more periods of 10 to15 minutes each for physical activity.  Utilize positive action plan and complete questions on "To Do List."   Follow-Up Plan: Attend Core Session 11.  Email completed "Food and Activity Tracker" to be reviewed by Lifestyle Coach.

## 2022-04-03 ENCOUNTER — Encounter: Payer: Self-pay | Admitting: Dietician

## 2022-04-03 ENCOUNTER — Encounter: Payer: Medicare Other | Attending: Family Medicine | Admitting: Dietician

## 2022-04-03 DIAGNOSIS — R7303 Prediabetes: Secondary | ICD-10-CM | POA: Insufficient documentation

## 2022-04-03 NOTE — Progress Notes (Signed)
On 04/03/22 patient attended a virtual grocery store tour session as part of the Diabetes Prevention Program with [facility].  Virtual Visit via Video Note  I connected with Barbara Nelson by a video enabled application and verified that I am speaking with the correct person using two identifiers.  Location: Patient: Home Provider: Office   Learning Objectives: Develop a plan for our grocery shopping experience Putting together a list How to navigate the grocery store Identify 4 main sections of the grocery store Produce Meat/Poultry/Fish Dairy  Inside Aisles Consider tips for shopping in each of these four sections Reflect on our own shopping habits Create a new goal for our next grocery shopping experience Engage in a group discussion  Goals:  Record weight taken outside of class.  Track foods and beverages eaten each day in the "Food and Activity Tracker," including calories and fat grams for each item.  Create one new goal for the next grocery shopping experience based on the information provided today  Follow-Up Plan: Attend next session.  Email completed "Food and Activity Tracker" before next session to be reviewed by Lifestyle Coach.

## 2022-04-17 ENCOUNTER — Encounter: Payer: Medicare Other | Attending: Family Medicine | Admitting: Dietician

## 2022-04-17 ENCOUNTER — Encounter: Payer: Self-pay | Admitting: Registered"

## 2022-04-17 ENCOUNTER — Encounter: Payer: Self-pay | Admitting: Dietician

## 2022-04-17 DIAGNOSIS — R7303 Prediabetes: Secondary | ICD-10-CM | POA: Insufficient documentation

## 2022-04-17 NOTE — Progress Notes (Signed)
On 03/20/22 patient completed Core Session 9 of Diabetes Prevention Program course virtually with [facility]. The following learning objectives were met by the patient during this class:   Virtual Visit via Video Note  I connected with Barbara Nelson by a video enabled application and verified that I am speaking with the correct person using two identifiers.  Location: Patient: Home.  Provider: Office.   Learning Objectives: List and describe five steps to problem solving.  Apply the five problem solving steps to resolve a problem he or she has with eating less fat and fewer calories or being more active.   Goals:  Record weight taken outside of class.  Track foods and beverages eaten each day in the "Food and Activity Tracker," including calories and fat grams for each item.   Track activity type, minutes you were active, and distance you reached each day in the "Food and Activity Tracker."  Set aside one 20 to 30-minute block of time every day or find two or more periods of 10 to15 minutes each for physical activity.  Use problem solving action plan created during session to problem solve.   Follow-Up Plan: Attend Core Session 10 next week.  Email completed "Food and Activity Tracker" next week to be reviewed by Lifestyle Coach. Email menus from favorite restaurants to next session for future discussion.

## 2022-04-17 NOTE — Progress Notes (Signed)
On 04/17/22 patient completed Session 11 of Diabetes Prevention Program course virtually with [facility]. By the end of this session patients are able to complete the following objectives:   Virtual Visit via Video Note  I connected with Barbara Nelson by a video enabled application and verified that I am speaking with the correct person using two identifiers.  Location: Patient: Home Provider: Office  Learning Objectives: Give examples of negative thoughts that could prevent them from meeting their goals of losing weight and being more physically active.  Describe how to stop negative thoughts and talk back to them with positive thoughts.  Practice 1) stopping negative thoughts and 2) talking back to negative thoughts with positive ones.    Goals:  Record weight taken outside of class.  Track foods and beverages eaten each day in the "Food and Activity Tracker," including calories and fat grams for each item.   Track activity type, minutes you were active, and distance you reached each day in the "Food and Activity Tracker."  If you have any negative thoughts-write them in your Food and Activity Trackers, along with how you talked back to them. Practice stopping negative thoughts and talking back to them with positive thoughts.   Follow-Up Plan: Attend Core Session 12 next week.  Email completed "Food and Activity Tracker" before next week to be reviewed by Lifestyle Coach.

## 2022-04-22 DIAGNOSIS — E559 Vitamin D deficiency, unspecified: Secondary | ICD-10-CM | POA: Diagnosis not present

## 2022-04-22 DIAGNOSIS — Z0000 Encounter for general adult medical examination without abnormal findings: Secondary | ICD-10-CM | POA: Diagnosis not present

## 2022-04-22 DIAGNOSIS — Z7189 Other specified counseling: Secondary | ICD-10-CM | POA: Diagnosis not present

## 2022-04-22 DIAGNOSIS — I10 Essential (primary) hypertension: Secondary | ICD-10-CM | POA: Diagnosis not present

## 2022-04-24 ENCOUNTER — Encounter: Payer: Medicare Other | Attending: Family Medicine | Admitting: Registered"

## 2022-04-24 ENCOUNTER — Encounter: Payer: Self-pay | Admitting: Registered"

## 2022-04-24 DIAGNOSIS — R7303 Prediabetes: Secondary | ICD-10-CM | POA: Insufficient documentation

## 2022-04-24 NOTE — Progress Notes (Signed)
Patient was seen on 04/24/22 for the Core Session 12 of Diabetes Prevention Program course at [facility]. By the end of this session patients are able to complete the following objectives:   Virtual Visit via Video Note  I connected with Barbara Nelson on 04/24/22 at  2:00 PM EST by a video enabled application and verified that I am speaking with the correct person using two identifiers.  Location: Patient: Home Provider: Office.   Learning Objectives: Describe their current progress toward defined goals. Describe common causes for slipping from healthy eating or being active. Explain what to do to get back on their feet after a slip.  Goals:  Record weight taken outside of class.  Track foods and beverages eaten each day in the "Food and Activity Tracker," including calories and fat grams for each item.   Track activity type, minutes active, and distance reached each day in the "Food and Activity Tracker."  Try out the two action plans created during session- "Slips from Healthy Eating: Action Plan" and "Slips from Being Active: Action Plan" Answer questions on the handout.   Follow-Up Plan: Attend Core Session 13 next week.  Bring completed "Food and Activity Tracker" next week to be reviewed by Lifestyle Coach.

## 2022-04-29 DIAGNOSIS — H1131 Conjunctival hemorrhage, right eye: Secondary | ICD-10-CM | POA: Diagnosis not present

## 2022-05-01 ENCOUNTER — Encounter: Payer: Medicare Other | Admitting: Dietician

## 2022-05-01 ENCOUNTER — Encounter: Payer: Self-pay | Admitting: Dietician

## 2022-05-01 DIAGNOSIS — R7303 Prediabetes: Secondary | ICD-10-CM

## 2022-05-01 NOTE — Progress Notes (Signed)
On 05/01/22 patient completed the Core Session 13 of Diabetes Prevention Program course virtually with [facility]. By the end of this session patients are able to complete the following objectives:   Virtual Visit via Video Note  I connected with [name] on 05/01/22 at  2:00 PM EST by a video enabled application and verified that I am speaking with the correct person using two identifiers.  Location: Patient: Home Provider: Office  Learning Objectives: Describe ways to add interest and variety to their activity plans. Define ?aerobic fitness. Explain the four F.I.T.T. principles (frequency, intensity, time, and type of activity) and how they relate to aerobic fitness.   Goals:  Record weight taken outside of class.  Track foods and beverages eaten each day in the "Food and Activity Tracker," including calories and fat grams for each item.   Track activity type, minutes you were active, and distance you reached each day in the "Food and Activity Tracker."  Do your best to reach activity goal for the week. Use one of the F.I.T.T. principles to jump start workouts. Document activity level on the "To Do Next Week" handout.  Follow-Up Plan: Attend Core Session 14 next week.  Email completed "Food and Activity Tracker" before next week to be reviewed by Lifestyle Coach.

## 2022-05-07 DIAGNOSIS — L309 Dermatitis, unspecified: Secondary | ICD-10-CM | POA: Diagnosis not present

## 2022-05-12 ENCOUNTER — Emergency Department (HOSPITAL_COMMUNITY): Payer: Medicare Other

## 2022-05-12 ENCOUNTER — Emergency Department (HOSPITAL_COMMUNITY)
Admission: EM | Admit: 2022-05-12 | Discharge: 2022-05-13 | Disposition: A | Discharge status: Home / Self Care | Payer: Medicare Other | Attending: Emergency Medicine | Admitting: Emergency Medicine

## 2022-05-12 ENCOUNTER — Other Ambulatory Visit: Payer: Self-pay

## 2022-05-12 ENCOUNTER — Encounter (HOSPITAL_COMMUNITY): Payer: Self-pay

## 2022-05-12 DIAGNOSIS — S199XXA Unspecified injury of neck, initial encounter: Secondary | ICD-10-CM | POA: Diagnosis not present

## 2022-05-12 DIAGNOSIS — Z79899 Other long term (current) drug therapy: Secondary | ICD-10-CM | POA: Insufficient documentation

## 2022-05-12 DIAGNOSIS — Z91040 Latex allergy status: Secondary | ICD-10-CM | POA: Diagnosis not present

## 2022-05-12 DIAGNOSIS — Z043 Encounter for examination and observation following other accident: Secondary | ICD-10-CM | POA: Diagnosis not present

## 2022-05-12 DIAGNOSIS — W19XXXA Unspecified fall, initial encounter: Secondary | ICD-10-CM

## 2022-05-12 DIAGNOSIS — M25551 Pain in right hip: Secondary | ICD-10-CM | POA: Insufficient documentation

## 2022-05-12 DIAGNOSIS — R42 Dizziness and giddiness: Secondary | ICD-10-CM | POA: Diagnosis not present

## 2022-05-12 DIAGNOSIS — M25561 Pain in right knee: Secondary | ICD-10-CM | POA: Diagnosis not present

## 2022-05-12 DIAGNOSIS — K029 Dental caries, unspecified: Secondary | ICD-10-CM | POA: Diagnosis not present

## 2022-05-12 DIAGNOSIS — Y92009 Unspecified place in unspecified non-institutional (private) residence as the place of occurrence of the external cause: Secondary | ICD-10-CM | POA: Insufficient documentation

## 2022-05-12 DIAGNOSIS — I10 Essential (primary) hypertension: Secondary | ICD-10-CM | POA: Insufficient documentation

## 2022-05-12 DIAGNOSIS — R600 Localized edema: Secondary | ICD-10-CM | POA: Diagnosis not present

## 2022-05-12 DIAGNOSIS — M1611 Unilateral primary osteoarthritis, right hip: Secondary | ICD-10-CM | POA: Diagnosis not present

## 2022-05-12 DIAGNOSIS — S0990XA Unspecified injury of head, initial encounter: Secondary | ICD-10-CM | POA: Diagnosis not present

## 2022-05-12 DIAGNOSIS — S0003XA Contusion of scalp, initial encounter: Secondary | ICD-10-CM | POA: Diagnosis not present

## 2022-05-12 DIAGNOSIS — W01198A Fall on same level from slipping, tripping and stumbling with subsequent striking against other object, initial encounter: Secondary | ICD-10-CM | POA: Diagnosis not present

## 2022-05-12 MED ORDER — DIPHENHYDRAMINE HCL 50 MG/ML IJ SOLN
25.0000 mg | Freq: Once | INTRAMUSCULAR | Status: AC
  Administered 2022-05-13: 25 mg via INTRAVENOUS
  Filled 2022-05-12: qty 1

## 2022-05-12 MED ORDER — METOPROLOL SUCCINATE ER 25 MG PO TB24
25.0000 mg | ORAL_TABLET | Freq: Once | ORAL | Status: DC
  Filled 2022-05-12: qty 1

## 2022-05-12 NOTE — ED Provider Notes (Signed)
[facility] EMERGENCY DEPARTMENT Provider Note   CSN: [id number] Arrival date & time: 05/12/22  2201     History  No chief complaint on file.   Barbara Nelson is a 81 y.o. female who presents after mechanical fall at home. States that she got up to use the restroom and her right knee gave out. Hx of pain in that knee following meniscus repair in the past. Now with pain in the right knee, right hip, and posterior head - hit her head on the dresser during fall. No LOC, blurry/dopuble vision, N/V, confusion, slurred speech or unilateral weakness preceding or since fall.   Of note patient states she has been having episodes of lightheadedness since her starting valtrex one week ago for shingles "but I don't think it's shingles" per patient. Patient has dermatologist in [place]. DENIES any lightheadedness or dizziness preceding fall today, simply states right knee was painful and "gave out".  I have personally reviewed her medical records. She has history of pulmonary blastomycosis, prediabetes, post op afib, HTN, LBBB, and tamponade secondary to viral pericarditis. No anticoagulation.   HPI     Home Medications Prior to Admission medications   Medication Sig Start Date End Date Taking? Authorizing Provider  hydrochlorothiazide (HYDRODIURIL) 12.5 MG tablet Take 12.5 mg by mouth every morning. 07/28/19   [provider]  ibuprofen (ADVIL,MOTRIN) 400 MG tablet Take 400 mg by mouth every 6 (six) hours as needed.    [provider]  metoprolol succinate (TOPROL-XL) 25 MG 24 hr tablet TAKE 1 TABLET (25 MG TOTAL) BY MOUTH DAILY. 01/05/22   [name], MD  Polyethyl Glycol-Propyl Glycol (SYSTANE) 0.4-0.3 % SOLN Place 1 drop into both eyes See admin instructions. 3 times daily    [provider]      Allergies    Latex, Penicillins, Tape, Z-pak [azithromycin], Eggs or egg-derived products, Amoxicillin, and Sulfa antibiotics    Review of Systems    Review of Systems  Constitutional: Negative.   HENT: Negative.    Eyes: Negative.   Respiratory: Negative.    Cardiovascular: Negative.   Gastrointestinal: Negative.   Genitourinary: Negative.   Musculoskeletal:  Positive for myalgias.  Skin:  Positive for rash.  Neurological:  Positive for light-headedness and headaches. Negative for dizziness.    Physical Exam Updated Vital Signs LMP  (LMP Unknown)  Physical Exam Vitals and nursing note reviewed.  Constitutional:      Appearance: She is obese. She is not toxic-appearing or diaphoretic.  HENT:     Head: Normocephalic.      Nose: Nose normal.     Mouth/Throat:     Mouth: Mucous membranes are moist.     Dentition: Abnormal dentition. Dental caries present.     Pharynx: Oropharynx is clear. Uvula midline. No oropharyngeal exudate or posterior oropharyngeal erythema.  Eyes:     General: Lids are normal. Vision grossly intact.        Right eye: No discharge.        Left eye: No discharge.     Extraocular Movements: Extraocular movements intact.     Conjunctiva/sclera: Conjunctivae normal.     Pupils: Pupils are equal, round, and reactive to light.  Neck:     Trachea: Trachea and phonation normal.  Cardiovascular:     Rate and Rhythm: Normal rate and regular rhythm.     Pulses: Normal pulses.     Heart sounds: Normal heart sounds. No murmur heard. Pulmonary:  Effort: Pulmonary effort is normal. No tachypnea, bradypnea, accessory muscle usage, prolonged expiration or respiratory distress.     Breath sounds: Normal breath sounds. No wheezing or rales.  Chest:     Chest wall: No mass, lacerations, deformity, swelling, tenderness, crepitus or edema.  Abdominal:     General: Bowel sounds are normal. There is no distension.     Palpations: Abdomen is soft.     Tenderness: There is no abdominal tenderness. There is no guarding or rebound.  Musculoskeletal:        General: No deformity.     Cervical back: Normal range of  motion and neck supple.     Right lower leg: 1+ Edema present.     Left lower leg: 1+ Edema present.  Lymphadenopathy:     Cervical: No cervical adenopathy.  Skin:    General: Skin is warm and dry.     Capillary Refill: Capillary refill takes less than 2 seconds.     Findings: Rash present.     Comments: Nonspecific rash, see photos below, unclear etiology. Multiple dermatomes.   Neurological:     General: No focal deficit present.     Mental Status: She is alert and oriented to person, place, and time. Mental status is at baseline.     GCS: GCS eye subscore is 4. GCS verbal subscore is 5. GCS motor subscore is 6.     Cranial Nerves: Cranial nerves 2-12 are intact.     Sensory: Sensation is intact.     Motor: Motor function is intact.     Coordination: Coordination is intact.     Gait: Gait is intact.     Comments: Gait deferred for patient safety initially, however subsequently ambulated with walker without difficulty.  Walks primarily independently but does have a walker at home for when necessary.  Psychiatric:        Mood and Affect: Mood normal.            ED Results / Procedures / Treatments   Labs (all labs ordered are listed, but only abnormal results are displayed) Labs Reviewed - No data to display  EKG None  Radiology No results found.  Procedures Procedures    Medications Ordered in ED Medications - No data to display  ED Course/ Medical Decision Making/ A&P                           Medical Decision Making 81 year old female presents after fall today at home.  Additionally has an associated rash.  Hypertensive and tachypneic on intake, vital signs otherwise normal.  Cardiopulmonary exam is normal, abdominal exam is benign.  Tenderness palpation over the right hip and right knee.  Hematoma over the posterior scalp.  Neurologically intact without focal deficit.  Amount and/or Complexity of Data Reviewed Labs: ordered.    Details: CBC without  leukocytosis or anemia, mild thrombocytopenia to 128. CMP with mild hyponatremia of 130, otherwise unremarkable.  Radiology: ordered.    Details:  Plain films of the chest with interstitial prominence in the lung bases reflecting chronic scarring versus fibrosis or interstitial edema.  Patient without acute pulmonary current symptoms, prominences visible on past chest x-rays, favor chronic etiology.  CT head and C-spine negative for acute cranial pathology or traumatic injury to the C-spine.  Plain film of the right hip concerning for question of right lateral femoral irregularity question fracture.  CT of the right hip without contrast was obtained  without fracture or dislocation.    Risk OTC drugs. Prescription drug management.   Patient ambulated in the emergency department, overall from a fall perspective exam and workup are very reassuring.  Seems as though patient had a mechanical fall secondary to her chronic right knee pain.  Regarding her rash symptoms do recommend she follows up closely with dermatology.  Strict return precautions were given. D oalso recommend PCP follow up for recheck of platelets.   Poet and her daughters voiced understanding of her medical evaluation and treatment plan. Each of their questions answered to their expressed satisfaction.  Return precautions were given.  Patient is well-appearing, stable, and was discharged in good condition.  This chart was dictated using voice recognition software, Dragon. Despite the best efforts of this provider to proofread and correct errors, errors may still occur which can change documentation meaning. Final Clinical Impression(s) / ED Diagnoses Final diagnoses:  None    Rx / DC Orders ED Discharge Orders     None         Emeline Darling, PA-C 05/13/22 [place], [place], DO 05/15/22 [phone]

## 2022-05-12 NOTE — ED Triage Notes (Signed)
Patient arrives via EMS for fall. Per family, patient has been taking a new medication for possible shingles and it has been causing balance issues. Tonight, patient was up and had a fall striking her R knee and hitting back of head. No LOC, negative for blood thinners. Patient does have hematoma on back of head. Alert and oriented x4 during triage, able to converse appropriately

## 2022-05-12 NOTE — ED Notes (Signed)
Patient taken to xray.

## 2022-05-13 ENCOUNTER — Emergency Department (HOSPITAL_COMMUNITY): Payer: Medicare Other

## 2022-05-13 DIAGNOSIS — M25551 Pain in right hip: Secondary | ICD-10-CM | POA: Diagnosis not present

## 2022-05-13 DIAGNOSIS — S0003XA Contusion of scalp, initial encounter: Secondary | ICD-10-CM | POA: Diagnosis not present

## 2022-05-13 DIAGNOSIS — S199XXA Unspecified injury of neck, initial encounter: Secondary | ICD-10-CM | POA: Diagnosis not present

## 2022-05-13 LAB — CBC WITH DIFFERENTIAL/PLATELET
Abs Immature Granulocytes: 0.04 10*3/uL (ref 0.00–0.07)
Basophils Absolute: 0 10*3/uL (ref 0.0–0.1)
Basophils Relative: 0 %
Eosinophils Absolute: 0 10*3/uL (ref 0.0–0.5)
Eosinophils Relative: 1 %
HCT: 42.1 % (ref 36.0–46.0)
Hemoglobin: 14.1 g/dL (ref 12.0–15.0)
Immature Granulocytes: 1 %
Lymphocytes Relative: 13 %
Lymphs Abs: 0.8 10*3/uL (ref 0.7–4.0)
MCH: 28.9 pg (ref 26.0–34.0)
MCHC: 33.5 g/dL (ref 30.0–36.0)
MCV: 86.3 fL (ref 80.0–100.0)
Monocytes Absolute: 0.8 10*3/uL (ref 0.1–1.0)
Monocytes Relative: 12 %
Neutro Abs: 4.4 10*3/uL (ref 1.7–7.7)
Neutrophils Relative %: 73 %
Platelets: 128 10*3/uL — ABNORMAL LOW (ref 150–400)
RBC: 4.88 MIL/uL (ref 3.87–5.11)
RDW: 13.5 % (ref 11.5–15.5)
WBC: 6 10*3/uL (ref 4.0–10.5)
nRBC: 0 % (ref 0.0–0.2)

## 2022-05-13 LAB — COMPREHENSIVE METABOLIC PANEL
ALT: 18 U/L (ref 0–44)
AST: 29 U/L (ref 15–41)
Albumin: 3.1 g/dL — ABNORMAL LOW (ref 3.5–5.0)
Alkaline Phosphatase: 57 U/L (ref 38–126)
Anion gap: 8 (ref 5–15)
BUN: 12 mg/dL (ref 8–23)
CO2: 25 mmol/L (ref 22–32)
Calcium: 9.8 mg/dL (ref 8.9–10.3)
Chloride: 97 mmol/L — ABNORMAL LOW (ref 98–111)
Creatinine, Ser: 0.73 mg/dL (ref 0.44–1.00)
GFR, Estimated: >60 mL/min (ref >60)
Glucose, Bld: 124 mg/dL — ABNORMAL HIGH (ref 70–99)
Potassium: 3.8 mmol/L (ref 3.5–5.1)
Sodium: 130 mmol/L — ABNORMAL LOW (ref 135–145)
Total Bilirubin: 0.5 mg/dL (ref 0.3–1.2)
Total Protein: 6.6 g/dL (ref 6.5–8.1)

## 2022-05-13 MED ORDER — ACETAMINOPHEN 325 MG PO TABS
650.0000 mg | ORAL_TABLET | Freq: Once | ORAL | Status: AC
  Administered 2022-05-13: 650 mg via ORAL
  Filled 2022-05-13: qty 2

## 2022-05-13 NOTE — Discharge Instructions (Addendum)
Barbara Nelson was seen in the ER today for her fall.  Fortunately she does not have any broken bones from her fall.  Her blood work was reassuring.  She should follow-up with her primary care doctor for recheck of her platelet level in the next week or 2.  Regarding her rash, the exact cause of her symptoms remains unclear does not appear to be any dangerous rash going on at this time.  She should follow-up with her dermatologist as discussed.  Additionally regarding her swelling in her hands may be related to her rash but she should continue to follow-up with her primary care doctor as well.  Return to the ER with any new severe symptoms.

## 2022-05-13 NOTE — ED Notes (Signed)
Ortho tech paged at this time to apply hinge knee brace

## 2022-05-15 ENCOUNTER — Encounter: Payer: Medicare Other | Admitting: Registered"

## 2022-05-15 ENCOUNTER — Encounter: Payer: Self-pay | Admitting: Registered"

## 2022-05-15 DIAGNOSIS — S0000XD Unspecified superficial injury of scalp, subsequent encounter: Secondary | ICD-10-CM | POA: Diagnosis not present

## 2022-05-15 DIAGNOSIS — W1830XD Fall on same level, unspecified, subsequent encounter: Secondary | ICD-10-CM | POA: Diagnosis not present

## 2022-05-15 DIAGNOSIS — G8929 Other chronic pain: Secondary | ICD-10-CM | POA: Diagnosis not present

## 2022-05-15 DIAGNOSIS — M25561 Pain in right knee: Secondary | ICD-10-CM | POA: Diagnosis not present

## 2022-05-15 DIAGNOSIS — R7303 Prediabetes: Secondary | ICD-10-CM | POA: Diagnosis not present

## 2022-05-15 DIAGNOSIS — B029 Zoster without complications: Secondary | ICD-10-CM | POA: Diagnosis not present

## 2022-05-15 DIAGNOSIS — I10 Essential (primary) hypertension: Secondary | ICD-10-CM | POA: Diagnosis not present

## 2022-05-15 DIAGNOSIS — G8911 Acute pain due to trauma: Secondary | ICD-10-CM | POA: Diagnosis not present

## 2022-05-15 DIAGNOSIS — I447 Left bundle-branch block, unspecified: Secondary | ICD-10-CM | POA: Diagnosis not present

## 2022-05-15 DIAGNOSIS — R000 Tachycardia, unspecified: Secondary | ICD-10-CM | POA: Diagnosis not present

## 2022-05-15 NOTE — Progress Notes (Signed)
On 05/15/22 patient completed Core Session 14 of Diabetes Prevention Program course virtually with [facility]. By the end of this session patients are able to complete the following objectives:   Virtual Visit via Video Note  I connected with Barbara Nelson by a video enabled application and verified that I am speaking with the correct person using two identifiers.  Location: Patient: Home.  Provider: Office.   Learning Objectives: Give examples of problem social cues and helpful social cues.  Explain how to remove problem social cues and add helpful ones.  Describe ways of coping with vacations and social events such as parties, holidays, and visits from relatives and friends.  Create an action plan to change a problem social cue and add a helpful one.   Goals:  Record weight taken outside of class.  Track foods and beverages eaten each day in the "Food and Activity Tracker," including calories and fat grams for each item.   Track activity type, minutes you were active, and distance you reached each day in the "Food and Activity Tracker."  Do your best to reach activity goal for the week. Use action plan created during session to change a problem social cue and add a helpful social cue.  Answer questions regarding success of changing social cues on "To Do Next Week" handout.   Follow-Up Plan: Attend Core Session 15 next week.  Email completed "Food and Activity Tracker" before next week to be reviewed by Lifestyle Coach.

## 2022-05-22 ENCOUNTER — Encounter: Payer: Medicare Other | Admitting: Dietician

## 2022-05-25 ENCOUNTER — Other Ambulatory Visit: Payer: Self-pay | Admitting: Nurse Practitioner

## 2022-05-25 ENCOUNTER — Ambulatory Visit
Admission: RE | Admit: 2022-05-25 | Discharge: 2022-05-25 | Disposition: A | Discharge status: Home / Self Care | Payer: Medicare Other | Source: Ambulatory Visit | Attending: Nurse Practitioner | Admitting: Nurse Practitioner

## 2022-05-25 DIAGNOSIS — M79641 Pain in right hand: Secondary | ICD-10-CM

## 2022-05-29 ENCOUNTER — Encounter: Payer: Medicare Other | Attending: Family Medicine | Admitting: Dietician

## 2022-05-29 ENCOUNTER — Encounter: Payer: Self-pay | Admitting: Dietician

## 2022-05-29 DIAGNOSIS — R7303 Prediabetes: Secondary | ICD-10-CM | POA: Insufficient documentation

## 2022-05-29 NOTE — Progress Notes (Signed)
On 05/29/22 patient completed Core Session 16 of Diabetes Prevention Program course virtually with [facility]. By the end of this session patients are able to complete the following objectives:   Virtual Visit via Video Note  I connected with Barbara Nelson on 05/29/22 at  2:00 PM EST by a video enabled application and verified that I am speaking with the correct person using two identifiers.  Location: Patient: Home Provider: Office  Learning Objectives: Measure their progress toward weight and physical activity goals since Session 1.  Develop a plan for improving progress, if their goals have not yet been attained.  Describe ways to stay motivated long-term.   Goals:  Record weight taken outside of class.  Track foods and beverages eaten each day in the "Food and Activity Tracker," including calories and fat grams for each item.   Track activity type, minutes you were active, and distance you reached each day in the "Food and Activity Tracker."  Utilize action plan to help stay motivated and complete questions on "To Do List."   Follow-Up Plan: Attend session 17 in three weeks.  Email completed "Food and Activity Tracker" before next session to be reviewed by Lifestyle Coach.

## 2022-06-04 ENCOUNTER — Other Ambulatory Visit: Payer: Self-pay | Admitting: Nurse Practitioner

## 2022-06-04 DIAGNOSIS — Z9181 History of falling: Secondary | ICD-10-CM

## 2022-06-16 ENCOUNTER — Other Ambulatory Visit: Payer: Self-pay | Admitting: Cardiology

## 2022-06-18 ENCOUNTER — Encounter (HOSPITAL_BASED_OUTPATIENT_CLINIC_OR_DEPARTMENT_OTHER): Payer: Self-pay

## 2022-06-18 ENCOUNTER — Inpatient Hospital Stay (HOSPITAL_BASED_OUTPATIENT_CLINIC_OR_DEPARTMENT_OTHER)
Admission: EM | Admit: 2022-06-18 | Discharge: 2022-06-26 | DRG: 193 | Disposition: A | Discharge status: Skilled Nursing Facility | Payer: Medicare Other | Attending: Internal Medicine | Admitting: Internal Medicine

## 2022-06-18 ENCOUNTER — Other Ambulatory Visit: Payer: Self-pay

## 2022-06-18 ENCOUNTER — Emergency Department (HOSPITAL_BASED_OUTPATIENT_CLINIC_OR_DEPARTMENT_OTHER): Payer: Medicare Other

## 2022-06-18 DIAGNOSIS — I480 Paroxysmal atrial fibrillation: Secondary | ICD-10-CM | POA: Diagnosis not present

## 2022-06-18 DIAGNOSIS — I447 Left bundle-branch block, unspecified: Secondary | ICD-10-CM | POA: Diagnosis present

## 2022-06-18 DIAGNOSIS — B402 Pulmonary blastomycosis, unspecified: Secondary | ICD-10-CM | POA: Diagnosis present

## 2022-06-18 DIAGNOSIS — R627 Adult failure to thrive: Secondary | ICD-10-CM | POA: Diagnosis present

## 2022-06-18 DIAGNOSIS — Z87891 Personal history of nicotine dependence: Secondary | ICD-10-CM | POA: Diagnosis not present

## 2022-06-18 DIAGNOSIS — E119 Type 2 diabetes mellitus without complications: Secondary | ICD-10-CM | POA: Diagnosis not present

## 2022-06-18 DIAGNOSIS — R0602 Shortness of breath: Principal | ICD-10-CM

## 2022-06-18 DIAGNOSIS — Z1152 Encounter for screening for COVID-19: Secondary | ICD-10-CM

## 2022-06-18 DIAGNOSIS — J9601 Acute respiratory failure with hypoxia: Secondary | ICD-10-CM | POA: Diagnosis not present

## 2022-06-18 DIAGNOSIS — Z9181 History of falling: Secondary | ICD-10-CM

## 2022-06-18 DIAGNOSIS — R54 Age-related physical debility: Secondary | ICD-10-CM | POA: Diagnosis not present

## 2022-06-18 DIAGNOSIS — E871 Hypo-osmolality and hyponatremia: Secondary | ICD-10-CM

## 2022-06-18 DIAGNOSIS — Z90710 Acquired absence of both cervix and uterus: Secondary | ICD-10-CM | POA: Diagnosis not present

## 2022-06-18 DIAGNOSIS — Z79899 Other long term (current) drug therapy: Secondary | ICD-10-CM

## 2022-06-18 DIAGNOSIS — Z902 Acquired absence of lung [part of]: Secondary | ICD-10-CM

## 2022-06-18 DIAGNOSIS — K5900 Constipation, unspecified: Secondary | ICD-10-CM | POA: Diagnosis not present

## 2022-06-18 DIAGNOSIS — I110 Hypertensive heart disease with heart failure: Secondary | ICD-10-CM | POA: Diagnosis not present

## 2022-06-18 DIAGNOSIS — I5033 Acute on chronic diastolic (congestive) heart failure: Secondary | ICD-10-CM | POA: Diagnosis not present

## 2022-06-18 DIAGNOSIS — Z882 Allergy status to sulfonamides status: Secondary | ICD-10-CM

## 2022-06-18 DIAGNOSIS — Z6836 Body mass index (BMI) 36.0-36.9, adult: Secondary | ICD-10-CM

## 2022-06-18 DIAGNOSIS — Z91040 Latex allergy status: Secondary | ICD-10-CM

## 2022-06-18 DIAGNOSIS — Z8249 Family history of ischemic heart disease and other diseases of the circulatory system: Secondary | ICD-10-CM | POA: Diagnosis not present

## 2022-06-18 DIAGNOSIS — Z91012 Allergy to eggs: Secondary | ICD-10-CM

## 2022-06-18 DIAGNOSIS — Z880 Allergy status to penicillin: Secondary | ICD-10-CM

## 2022-06-18 DIAGNOSIS — Z8041 Family history of malignant neoplasm of ovary: Secondary | ICD-10-CM

## 2022-06-18 DIAGNOSIS — T502X5A Adverse effect of carbonic-anhydrase inhibitors, benzothiadiazides and other diuretics, initial encounter: Secondary | ICD-10-CM | POA: Diagnosis present

## 2022-06-18 DIAGNOSIS — E669 Obesity, unspecified: Secondary | ICD-10-CM | POA: Diagnosis present

## 2022-06-18 DIAGNOSIS — Z8042 Family history of malignant neoplasm of prostate: Secondary | ICD-10-CM

## 2022-06-18 DIAGNOSIS — I10 Essential (primary) hypertension: Secondary | ICD-10-CM | POA: Diagnosis not present

## 2022-06-18 DIAGNOSIS — E440 Moderate protein-calorie malnutrition: Secondary | ICD-10-CM | POA: Diagnosis present

## 2022-06-18 DIAGNOSIS — J189 Pneumonia, unspecified organism: Secondary | ICD-10-CM | POA: Diagnosis not present

## 2022-06-18 DIAGNOSIS — Z8349 Family history of other endocrine, nutritional and metabolic diseases: Secondary | ICD-10-CM

## 2022-06-18 DIAGNOSIS — E876 Hypokalemia: Secondary | ICD-10-CM | POA: Diagnosis present

## 2022-06-18 DIAGNOSIS — E8809 Other disorders of plasma-protein metabolism, not elsewhere classified: Secondary | ICD-10-CM | POA: Diagnosis not present

## 2022-06-18 DIAGNOSIS — Z806 Family history of leukemia: Secondary | ICD-10-CM

## 2022-06-18 DIAGNOSIS — R0600 Dyspnea, unspecified: Secondary | ICD-10-CM

## 2022-06-18 DIAGNOSIS — Z823 Family history of stroke: Secondary | ICD-10-CM

## 2022-06-18 DIAGNOSIS — R5381 Other malaise: Secondary | ICD-10-CM | POA: Diagnosis present

## 2022-06-18 LAB — CBC WITH DIFFERENTIAL/PLATELET
Abs Immature Granulocytes: 0.06 10*3/uL (ref 0.00–0.07)
Basophils Absolute: 0 10*3/uL (ref 0.0–0.1)
Basophils Relative: 1 %
Eosinophils Absolute: 0 10*3/uL (ref 0.0–0.5)
Eosinophils Relative: 1 %
HCT: 38.3 % (ref 36.0–46.0)
Hemoglobin: 13 g/dL (ref 12.0–15.0)
Immature Granulocytes: 1 %
Lymphocytes Relative: 13 %
Lymphs Abs: 0.9 10*3/uL (ref 0.7–4.0)
MCH: 27.9 pg (ref 26.0–34.0)
MCHC: 33.9 g/dL (ref 30.0–36.0)
MCV: 82.2 fL (ref 80.0–100.0)
Monocytes Absolute: 1.2 10*3/uL — ABNORMAL HIGH (ref 0.1–1.0)
Monocytes Relative: 18 %
Neutro Abs: 4.4 10*3/uL (ref 1.7–7.7)
Neutrophils Relative %: 66 %
Platelets: 171 10*3/uL (ref 150–400)
RBC: 4.66 MIL/uL (ref 3.87–5.11)
RDW: 14.2 % (ref 11.5–15.5)
WBC: 6.6 10*3/uL (ref 4.0–10.5)
nRBC: 0 % (ref 0.0–0.2)

## 2022-06-18 LAB — BASIC METABOLIC PANEL
Anion gap: 8 (ref 5–15)
BUN: 8 mg/dL (ref 8–23)
CO2: 28 mmol/L (ref 22–32)
Calcium: 8.1 mg/dL — ABNORMAL LOW (ref 8.9–10.3)
Chloride: 91 mmol/L — ABNORMAL LOW (ref 98–111)
Creatinine, Ser: 0.71 mg/dL (ref 0.44–1.00)
GFR, Estimated: >60 mL/min (ref >60)
Glucose, Bld: 133 mg/dL — ABNORMAL HIGH (ref 70–99)
Potassium: 3.3 mmol/L — ABNORMAL LOW (ref 3.5–5.1)
Sodium: 127 mmol/L — ABNORMAL LOW (ref 135–145)

## 2022-06-18 LAB — TROPONIN I (HIGH SENSITIVITY)
Troponin I (High Sensitivity): 11 ng/L (ref <18)
Troponin I (High Sensitivity): 10 ng/L (ref <18)

## 2022-06-18 LAB — PROCALCITONIN: Procalcitonin: <0.1 ng/mL

## 2022-06-18 LAB — GLUCOSE, CAPILLARY: Glucose-Capillary: 138 mg/dL — ABNORMAL HIGH (ref 70–99)

## 2022-06-18 LAB — MAGNESIUM: Magnesium: 1.8 mg/dL (ref 1.7–2.4)

## 2022-06-18 LAB — D-DIMER, QUANTITATIVE: D-Dimer, Quant: 1.13 ug/mL-FEU — ABNORMAL HIGH (ref 0.00–0.50)

## 2022-06-18 LAB — BRAIN NATRIURETIC PEPTIDE: B Natriuretic Peptide: 50.2 pg/mL (ref 0.0–100.0)

## 2022-06-18 MED ORDER — PREGABALIN 25 MG PO CAPS
25.0000 mg | ORAL_CAPSULE | Freq: Every day | ORAL | Status: DC
  Administered 2022-06-18 – 2022-06-25 (×5): 25 mg via ORAL
  Filled 2022-06-18 (×7): qty 1

## 2022-06-18 MED ORDER — ENOXAPARIN SODIUM 40 MG/0.4ML IJ SOSY
40.0000 mg | PREFILLED_SYRINGE | INTRAMUSCULAR | Status: DC
  Administered 2022-06-19 – 2022-06-26 (×8): 40 mg via SUBCUTANEOUS
  Filled 2022-06-18 (×8): qty 0.4

## 2022-06-18 MED ORDER — POLYVINYL ALCOHOL 1.4 % OP SOLN
1.0000 [drp] | Freq: Three times a day (TID) | OPHTHALMIC | Status: DC
  Administered 2022-06-18 – 2022-06-25 (×18): 1 [drp] via OPHTHALMIC
  Filled 2022-06-18: qty 15

## 2022-06-18 MED ORDER — ONDANSETRON HCL 4 MG PO TABS: 4.0000 mg | ORAL_TABLET | Freq: Four times a day (QID) | ORAL | Status: DC | PRN

## 2022-06-18 MED ORDER — ACETAMINOPHEN 650 MG RE SUPP: 650.0000 mg | Freq: Four times a day (QID) | RECTAL | Status: DC | PRN

## 2022-06-18 MED ORDER — IOHEXOL 350 MG/ML SOLN
100.0000 mL | Freq: Once | INTRAVENOUS | Status: AC | PRN
  Administered 2022-06-18: 100 mL via INTRAVENOUS

## 2022-06-18 MED ORDER — ONDANSETRON HCL 4 MG/2ML IJ SOLN
4.0000 mg | Freq: Four times a day (QID) | INTRAMUSCULAR | Status: DC | PRN
  Administered 2022-06-19 – 2022-06-23 (×2): 4 mg via INTRAVENOUS
  Filled 2022-06-18 (×3): qty 2

## 2022-06-18 MED ORDER — ACETAMINOPHEN 325 MG PO TABS
650.0000 mg | ORAL_TABLET | Freq: Four times a day (QID) | ORAL | Status: DC | PRN
  Administered 2022-06-19: 650 mg via ORAL
  Filled 2022-06-18: qty 2

## 2022-06-18 MED ORDER — FUROSEMIDE 10 MG/ML IJ SOLN
40.0000 mg | Freq: Once | INTRAMUSCULAR | Status: AC
  Administered 2022-06-18: 40 mg via INTRAVENOUS
  Filled 2022-06-18: qty 4

## 2022-06-18 MED ORDER — METOPROLOL SUCCINATE ER 25 MG PO TB24
25.0000 mg | ORAL_TABLET | Freq: Every day | ORAL | Status: DC
  Administered 2022-06-19 – 2022-06-20 (×2): 25 mg via ORAL
  Filled 2022-06-18 (×2): qty 1

## 2022-06-18 MED ORDER — POTASSIUM CHLORIDE CRYS ER 20 MEQ PO TBCR
40.0000 meq | EXTENDED_RELEASE_TABLET | Freq: Once | ORAL | Status: AC
  Administered 2022-06-18: 40 meq via ORAL
  Filled 2022-06-18: qty 2

## 2022-06-18 NOTE — ED Notes (Signed)
[facility] at bedside 

## 2022-06-18 NOTE — Assessment & Plan Note (Signed)
Cont Metoprolol

## 2022-06-18 NOTE — Assessment & Plan Note (Addendum)
Cause of new O2 requirement is not at all clear at this point: 1) BNP normal, no CT chest findings other tha minimal pleural effusion to suggest volume overload as cause 2) CTA chest neg for PE 3) CTA chest neg for PNA or other new lung abnormalities 4) Perhaps a more insidious onset of COPD given h/o pulmonary lobectomy + 15 py h/o smoking in past?  History makes it sound more acute, however. 5) check Procalcitonin 6) check COVID, Flu, RSV, and RVP 7) got 40mg  IV lasix in ED, will hold off on further diuretic for the moment and see how she responds to this first. 8) PAH perhaps? Could cause hypoxia with exertion and a negative CT scan ->  Will check 2D echo 9) tele monitor 10) cont pulse ox

## 2022-06-18 NOTE — H&P (Signed)
History and Physical    Patient: Barbara Nelson NIO:[id number] DOB: 18-Jan-1941 DOA: 06/18/2022 DOS: the patient was seen and examined on 06/18/2022 PCP: Cari Caraway, MD  Patient coming from: Home  Chief Complaint:  Chief Complaint  Patient presents with   Shortness of Breath   HPI: Barbara Nelson is a 82 y.o. female with medical history significant of HTN, post-op PAF, mycetoma s/p LML lobectomy of lung, Pericardectomy for tamponade from viral pericarditis.  Pt in to ED at [facility] with c/o several day h/o cough productive of grey sputum and progressively worsening SOB.  On Monday pt was taken off on HCTZ due to hyponatremia by PCP.  Since that time she has noticed progressive SOB, DOE, cough.  No orthopnea per patient.  No fevers, chills, CP, N/V/D.    Review of Systems: As mentioned in the history of present illness. All other systems reviewed and are negative. Past Medical History:  Diagnosis Date   Benign essential HTN 10/31/2015   Cardiac tamponade due to viral pericarditis    LBBB (left bundle branch block)    Lung mass    fungal infection s/p resection at [facility]   Postoperative atrial fibrillation [facility])    Pulmonary nodule    Past Surgical History:  Procedure Laterality Date   ABDOMINAL HYSTERECTOMY  1988   chest tube placement  09/03/11   LUNG REMOVAL, PARTIAL     right knee arthoscopy with meniscal repair     subxiphoid pericardial window and drainage anterior partial pericardiectomy  09/03/11   Social History:  reports that she quit smoking about 43 years ago. Her smoking use included cigarettes. She has a 15.00 pack-year smoking history. She has never used smokeless tobacco. She reports that she does not drink alcohol and does not use drugs.  Allergies  Allergen Reactions   Latex Rash   Penicillins Rash and Itching   Tape Hives and Rash   Z-Pak [Azithromycin] Rash and Other (See Comments)    Mouth sores   Eggs Or Egg-Derived Products Nausea And Vomiting    Amoxil [Amoxicillin] Rash   Sulfa Antibiotics Rash    Family History  Problem Relation Age of Onset   Hypertension Father    Stroke Father    Hypertension Mother    Ovarian cancer Mother    Prostate cancer Brother    Thyroid disease Sister    Hypertension Sister    Leukemia Maternal Aunt     Prior to Admission medications   Medication Sig Start Date End Date Taking? Authorizing Provider  acetaminophen (TYLENOL) 325 MG tablet Take 650 mg by mouth daily as needed for mild pain, moderate pain, fever or headache.   Yes [provider]  metoprolol succinate (TOPROL-XL) 25 MG 24 hr tablet TAKE 1 TABLET (25 MG TOTAL) BY MOUTH DAILY. 06/16/22  Yes Turner, Eber Hong, MD  pregabalin (LYRICA) 25 MG capsule Take 25 mg by mouth at bedtime.   Yes [provider]  Propylene Glycol (SYSTANE COMPLETE) 0.6 % SOLN Place 1 drop into both eyes 3 (three) times daily.   Yes [provider]  metFORMIN (GLUCOPHAGE-XR) 500 MG 24 hr tablet Take 500 mg by mouth daily with supper. Patient not taking: Reported on 06/18/2022    [provider]    Physical Exam: Vitals:   06/18/22 1700 06/18/22 1730 06/18/22 1800 06/18/22 2000  BP: 117/88 117/83 129/87 118/74  Pulse: 88 (!) 46 80 84  Resp: (!) 29 (!) 27 (!) 24 20  Temp:  TempSrc:      SpO2: 98% 97% 97% 97%  Weight:      Height:       Constitutional: NAD, calm, comfortable Respiratory: clear to auscultation bilaterally, no wheezing, no crackles. Normal respiratory effort. No accessory muscle use.  Cardiovascular: Regular rate and rhythm, no murmurs / rubs / gallops. Trace BLE edema. 2+ pedal pulses. No carotid bruits.  Abdomen: no tenderness, no masses palpated. No hepatosplenomegaly. Bowel sounds positive.  Neurologic: Grossly non-focal Psychiatric: Normal judgment and insight. Alert and oriented x 3. Normal mood.   Data Reviewed:       Latest Ref Rng & Units 06/18/2022    8:03 AM 05/13/2022   12:13 AM  03/03/2012    8:30 PM  BMP  Glucose 70 - 99 mg/dL 133  124  127   BUN 8 - 23 mg/dL 8  12  20    Creatinine 0.44 - 1.00 mg/dL 0.71  0.73  1.10   Sodium 135 - 145 mmol/L 127  130  137   Potassium 3.5 - 5.1 mmol/L 3.3  3.8  3.7   Chloride 98 - 111 mmol/L 91  97  97   CO2 22 - 32 mmol/L 28  25  29    Calcium 8.9 - 10.3 mg/dL 8.1  9.8  9.7    Sodium at PCPs office on 1/30 = 126  CTA chest: IMPRESSION: No definite evidence of pulmonary embolus.   Minimal right pleural effusion with minimal bibasilar subsegmental atelectasis.  BNP 50  Trops: 11 and 10  CBC unremarkable.  Assessment and Plan: * Acute respiratory failure with hypoxia (HCC) Cause of new O2 requirement is not at all clear at this point: 1) BNP normal, no CT chest findings other tha minimal pleural effusion to suggest volume overload as cause 2) CTA chest neg for PE 3) CTA chest neg for PNA or other new lung abnormalities 4) Perhaps a more insidious onset of COPD given h/o pulmonary lobectomy + 15 py h/o smoking in past?  History makes it sound more acute, however. 5) check Procalcitonin 6) check COVID, Flu, RSV, and RVP 7) got 40mg  IV lasix in ED, will hold off on further diuretic for the moment and see how she responds to this first. 8) PAH perhaps? Could cause hypoxia with exertion and a negative CT scan ->  Will check 2D echo 9) tele monitor 10) cont pulse ox  Hyponatremia Suspected to be due to HCTZ, stopped on Monday by PCP. Sodium of 127 today is actually up from 126 on PCP labs. Monitor sodium with daily BMP while here.  Benign essential HTN Cont Metoprolol      Advance Care Planning:   Code Status: Full Code  Consults: None  Family Communication: Daughter at bedside.  Severity of Illness: The appropriate patient status for this patient is OBSERVATION. Observation status is judged to be reasonable and necessary in order to provide the required intensity of service to ensure the patient's safety.  The patient's presenting symptoms, physical exam findings, and initial radiographic and laboratory data in the context of their medical condition is felt to place them at decreased risk for further clinical deterioration. Furthermore, it is anticipated that the patient will be medically stable for discharge from the hospital within 2 midnights of admission.   Author: Etta Quill., DO 06/18/2022 8:49 PM  For on call review [email or web address].

## 2022-06-18 NOTE — ED Notes (Signed)
ED TO INPATIENT HANDOFF REPORT  ED Nurse Name and Phone #: Doylene Bode, RN   S Name/Age/Gender Barbara Nelson 82 y.o. female Room/Bed: MH07/MH07  Code Status   Code Status: Not on file  Home/SNF/Other Home Patient oriented to: self, place, time, and situation Is this baseline? Yes   Triage Complete: Triage complete  Chief Complaint Acute respiratory failure with hypoxia ([place]) [J96.01]  Triage Note Pt arrived POV with daughter. Reports PCP stopped fluid medication on Monday due to abnormal lab values. Pt reports SOB   Allergies Allergies  Allergen Reactions   Latex Rash   Penicillins Rash and Itching   Tape Hives and Rash   Z-Pak [Azithromycin] Other (See Comments) and Rash    Makes mouth sore.   Eggs Or Egg-Derived Products     Other reaction(s): nausea and vomiting   Amoxicillin Rash   Sulfa Antibiotics Rash    Level of Care/Admitting Diagnosis ED Disposition     ED Disposition  Admit   Condition  --   Comment  Hospital Area: [place] [[id number]]  Level of Care: Telemetry Medical [104]  Interfacility transfer: Yes  May place patient in observation at [facility] or [place] if equivalent level of care is available:: Yes  Covid Evaluation: Asymptomatic - no recent exposure (last 10 days) testing not required  Diagnosis: Acute respiratory failure with hypoxia [facility]) [[id number]]  Admitting Physician: Karmen Bongo [[id number]]  Attending Physician: Karmen Bongo [[id number]]          B Medical/Surgery History Past Medical History:  Diagnosis Date   Benign essential HTN 10/31/2015   Cardiac tamponade due to viral pericarditis    LBBB (left bundle branch block)    Lung mass    fungal infection s/p resection at [facility]   Postoperative atrial fibrillation [facility])    Pulmonary nodule    Past Surgical History:  Procedure Laterality Date   ABDOMINAL HYSTERECTOMY  1988   chest tube placement  09/03/11   LUNG REMOVAL, PARTIAL     right knee  arthoscopy with meniscal repair     subxiphoid pericardial window and drainage anterior partial pericardiectomy  09/03/11     A IV Location/Drains/Wounds Patient Lines/Drains/Airways Status     Active Line/Drains/Airways     Name Placement date Placement time Site Days   Peripheral IV 06/18/22 20 G Anterior;Left;Proximal Antecubital 06/18/22  0807  Antecubital  less than 1   External Urinary Catheter 06/18/22  1228  --  less than 1            Intake/Output Last 24 hours  Intake/Output Summary (Last 24 hours) at 06/18/2022 1603 Last data filed at 06/18/2022 1350 Gross per 24 hour  Intake --  Output 1 ml  Net -1 ml    Labs/Imaging Results for orders placed or performed during the hospital encounter of 06/18/22 (from the past 48 hour(s))  CBC with Differential     Status: Abnormal   Collection Time: 06/18/22  8:03 AM  Result Value Ref Range   WBC 6.6 4.0 - 10.5 K/uL   RBC 4.66 3.87 - 5.11 MIL/uL   Hemoglobin 13.0 12.0 - 15.0 g/dL   HCT 38.3 36.0 - 46.0 %   MCV 82.2 80.0 - 100.0 fL   MCH 27.9 26.0 - 34.0 pg   MCHC 33.9 30.0 - 36.0 g/dL   RDW 14.2 11.5 - 15.5 %   Platelets 171 150 - 400 K/uL   nRBC 0.0 0.0 - 0.2 %  Neutrophils Relative % 66 %   Neutro Abs 4.4 1.7 - 7.7 K/uL   Lymphocytes Relative 13 %   Lymphs Abs 0.9 0.7 - 4.0 K/uL   Monocytes Relative 18 %   Monocytes Absolute 1.2 (H) 0.1 - 1.0 K/uL   Eosinophils Relative 1 %   Eosinophils Absolute 0.0 0.0 - 0.5 K/uL   Basophils Relative 1 %   Basophils Absolute 0.0 0.0 - 0.1 K/uL   Immature Granulocytes 1 %   Abs Immature Granulocytes 0.06 0.00 - 0.07 K/uL    Comment: Performed at [facility], [address] [place], [place] [address]  Basic metabolic panel     Status: Abnormal   Collection Time: 06/18/22  8:03 AM  Result Value Ref Range   Sodium 127 (L) 135 - 145 mmol/L   Potassium 3.3 (L) 3.5 - 5.1 mmol/L   Chloride 91 (L) 98 - 111 mmol/L   CO2 28 22 - 32 mmol/L   Glucose, Bld 133 (H) 70 - 99  mg/dL    Comment: Glucose reference range applies only to samples taken after fasting for at least 8 hours.   BUN 8 8 - 23 mg/dL   Creatinine, Ser 0.71 0.44 - 1.00 mg/dL   Calcium 8.1 (L) 8.9 - 10.3 mg/dL   GFR, Estimated >60 >60 mL/min    Comment: (NOTE) Calculated using the CKD-EPI Creatinine Equation (2021)    Anion gap 8 5 - 15    Comment: Performed at [facility], [address] [place], [place] [address]  Magnesium     Status: None   Collection Time: 06/18/22  8:03 AM  Result Value Ref Range   Magnesium 1.8 1.7 - 2.4 mg/dL    Comment: Performed at [facility], [address] [place], [place] [address]  Troponin I (High Sensitivity)     Status: None   Collection Time: 06/18/22  8:03 AM  Result Value Ref Range   Troponin I (High Sensitivity) 11 <18 ng/L    Comment: (NOTE) Elevated high sensitivity troponin I (hsTnI) values and significant  changes across serial measurements may suggest ACS but many other  chronic and acute conditions are known to elevate hsTnI results.  Refer to the "Links" section for chest pain algorithms and additional  guidance. Performed at [facility], [address] [place], [place] [address]   Brain natriuretic peptide     Status: None   Collection Time: 06/18/22  8:03 AM  Result Value Ref Range   B Natriuretic Peptide 50.2 0.0 - 100.0 pg/mL    Comment: Performed at [facility], [address] [place], [place] [address]  Troponin I (High Sensitivity)     Status: None   Collection Time: 06/18/22 10:15 AM  Result Value Ref Range   Troponin I (High Sensitivity) 10 <18 ng/L    Comment: (NOTE) Elevated high sensitivity troponin I (hsTnI) values and significant  changes across serial measurements may suggest ACS but many other  chronic and acute conditions are known to elevate hsTnI results.  Refer to the "Links" section for chest pain algorithms and additional  guidance. Performed at [facility], [address] [place], [place] [address]   D-dimer, quantitative     Status: Abnormal   Collection Time: 06/18/22 10:51 AM  Result Value Ref Range   D-Dimer, Quant 1.13 (H) 0.00 - 0.50 ug/mL-FEU    Comment: (NOTE) At  the manufacturer cut-off value of 0.5 g/mL FEU, this assay has a negative predictive value of 95-100%.This assay is intended for use in conjunction with a clinical pretest probability (PTP) assessment model to exclude pulmonary embolism (PE) and deep venous thrombosis (DVT) in outpatients suspected of PE or DVT. Results should be correlated with clinical presentation. Performed at [facility], [address], [place], [place] [address]    CT Angio Chest PE W and/or Wo Contrast  Result Date: 06/18/2022 CLINICAL DATA:  Shortness of breath. EXAM: CT ANGIOGRAPHY CHEST WITH CONTRAST TECHNIQUE: Multidetector CT imaging of the chest was performed using the standard protocol during bolus administration of intravenous contrast. Multiplanar CT image reconstructions and MIPs were obtained to evaluate the vascular anatomy. RADIATION DOSE REDUCTION: This exam was performed according to the departmental dose-optimization program which includes automated exposure control, adjustment of the mA and/or kV according to patient size and/or use of iterative reconstruction technique. CONTRAST:  160mL OMNIPAQUE IOHEXOL 350 MG/ML SOLN COMPARISON:  September 12, 2015. FINDINGS: Cardiovascular: Satisfactory opacification of the pulmonary arteries to the segmental level. No evidence of pulmonary embolism. Mild cardiomegaly. No pericardial effusion. Mediastinum/Nodes: No enlarged mediastinal, hilar, or axillary lymph nodes. Thyroid gland, trachea, and esophagus demonstrate no significant findings. Lungs/Pleura: Minimal right pleural effusion is noted. No pneumothorax is noted. Minimal bibasilar subsegmental atelectasis is noted. Stable probable scarring is seen in right midlung related  to prior lobectomy. Upper Abdomen: No acute abnormality. Musculoskeletal: No chest wall abnormality. No acute or significant osseous findings. Review of the MIP images confirms the above findings. IMPRESSION: No definite evidence of pulmonary embolus. Minimal right pleural effusion with minimal bibasilar subsegmental atelectasis. Electronically Signed   By: Marijo Conception M.D.   On: 06/18/2022 13:05   DG Chest 2 View  Result Date: 06/18/2022 CLINICAL DATA:  SOB EXAM: CHEST - 2 VIEW COMPARISON:  May 12, 2022 FINDINGS: The cardiomediastinal silhouette is unchanged in contour. Trace bilateral pleural effusions. No pneumothorax. Background of diffuse interstitial prominence, similar in comparison to prior. There is more confluent opacity in the RIGHT lateral mid lung and upper lung which are new in comparison to prior. Visualized abdomen is unremarkable. Multilevel degenerative changes of the thoracic spine. IMPRESSION: New confluent opacities in the RIGHT lateral mid lung and upper lung. Differential considerations include infection or superimposed atelectasis on a background of pulmonary edema versus chronic interstitial lung disease. Recommend follow-up PA and lateral chest radiograph 4 weeks after appropriate treatment to assess for resolution. Electronically Signed   By: Valentino Saxon M.D.   On: 06/18/2022 08:38    Pending Labs Unresulted Labs (From admission, onward)    None       Vitals/Pain Today's Vitals   06/18/22 1345 06/18/22 1348 06/18/22 1354 06/18/22 1530  BP: 136/77 136/77  (!) 130/93  Pulse: 85 82 83 88  Resp: (!) 33  12 (!) 26  Temp:      TempSrc:      SpO2: 91% 96% 95% 99%  Weight:      Height:      PainSc:        Isolation Precautions No active isolations  Medications Medications  iohexol (OMNIPAQUE) 350 MG/ML injection 100 mL (100 mLs Intravenous Contrast Given 06/18/22 1235)  furosemide (LASIX) injection 40 mg (40 mg Intravenous Given 06/18/22 1529)     Mobility walks with device     Focused Assessments Cardiac Assessment Handoff:  Cardiac Rhythm: Normal sinus rhythm No results found for: "CKTOTAL", "CKMB", "  CKMBINDEX", "TROPONINI" Lab Results  Component Value Date   DDIMER 1.13 (H) 06/18/2022   Does the Patient currently have chest pain? No   , Pulmonary Assessment Handoff:  Lung sounds: Bilateral Breath Sounds: Fine crackles O2 Device: Nasal Cannula O2 Flow Rate (L/min): 2 L/min    R Recommendations: See Admitting Provider Note  Report given to:   Additional Notes: Has Purewick and has received Lasix.

## 2022-06-18 NOTE — ED Notes (Signed)
Patient transported to X-ray 

## 2022-06-18 NOTE — ED Notes (Signed)
Ambulated on r/a with walker in hall, SpO2 93% or greater, HR 88-102.  Endorsees SHOB, pauses while ambulating to rest.

## 2022-06-18 NOTE — ED Notes (Signed)
Report given to [facility]link  °

## 2022-06-18 NOTE — Progress Notes (Signed)
Plan of Care Note for accepted transfer   Patient: Barbara Nelson MRN: [id number]   [place]: 06/18/2022  Facility requesting transfer: [facility] Requesting Provider: Mayra Neer Reason for transfer: SOB  Facility course: Patient with h/o HTN, chronic pulmonary blastomycosis (with associated lobectomy), pericardial effusion/sp pericardial window (2013), and post-operative afib presenting with SOB, DOE. No distress.  Evaluation negative except some crackles on exam and her diuretic was recently stopped due to low BP and hyponatremia.  Na++ 127.  CTA negative (CXR with consolidation but CT negative for that).  Normal BNP.  86-87% on RA and this is new.  ?need for diuresis and electrolyte management.   Plan of care: The patient is accepted for observation to Telemetry unit, at [facility].  Given her prior significant lung history, pulm consult may be reasonable.   Author: Karmen Bongo, MD 06/18/2022  Check [email or web address] for on-call coverage.  Nursing staff, Please call [facility] number on Amion as soon as patient's arrival, so appropriate admitting provider can evaluate the pt.

## 2022-06-18 NOTE — ED Triage Notes (Addendum)
Pt arrived POV with daughter. Reports PCP stopped fluid medication on Monday due to abnormal lab values. Pt reports SOB

## 2022-06-18 NOTE — ED Provider Notes (Signed)
[facility] Provider Note   CSN: [id number] Arrival date & time: 06/18/22  0725     History  Chief Complaint  Patient presents with   Shortness of Breath    Barbara Nelson is a 82 y.o. female with HTN, palpitations, A-fib, chronic pulmonary blastomycosis, h/o pericardial effusion/pericarditis status post pericardial window 08/2011, RLL Lung mass s/p lobectomy 2014, left bundle branch block, obesity who presents with shortness of breath.  Patient presents with her daughter who provides additional history.  She states that she recently had her sodium checked a couple of days ago and it was a little bit low.  They have tried to stay away from sodium and so the [facility] primary care physician told him to reintroduce sodium back into the diet and stop the diuretic that she was taking also because her blood pressure was reported to be low.  She has been without the diuretic now for several days.  Over the last several days patient is noted to be mildly short of breath.  Daughter states at 1 point she noticed that she was "panting."  Also has had cough productive of gray sputum.  Denies fever/chills, chest pain, hemoptysis, nausea vomiting diarrhea constipation.  Endorses some left lower extremity edema pt reports SOB as she lies here in the bed right now.  She denies orthopnea but does endorse some dyspnea on exertion.  Patient does endorse history of A-fib but does not take any blood thinners per chart review. Per chart review, patient started metformin and had hyponatremia on 06/16/22 but I cannot see the note or the lab values.   HPI     Home Medications Prior to Admission medications   Medication Sig Start Date End Date Taking? Authorizing Provider  hydrochlorothiazide (HYDRODIURIL) 12.5 MG tablet Take 12.5 mg by mouth every morning. 07/28/19  Yes [provider]  metoprolol succinate (TOPROL-XL) 25 MG 24 hr tablet TAKE 1 TABLET (25 MG  TOTAL) BY MOUTH DAILY. 06/16/22  Yes Turner, Eber Hong, MD  ibuprofen (ADVIL,MOTRIN) 400 MG tablet Take 400 mg by mouth every 6 (six) hours as needed.    [provider]  Polyethyl Glycol-Propyl Glycol (SYSTANE) 0.4-0.3 % SOLN Place 1 drop into both eyes See admin instructions. 3 times daily    [provider]      Allergies    Latex, Penicillins, Tape, Z-pak [azithromycin], Eggs or egg-derived products, Amoxicillin, and Sulfa antibiotics    Review of Systems   Review of Systems Review of systems Negative for f/c.  A 10 point review of systems was performed and is negative unless otherwise reported in HPI.  Physical Exam Updated Vital Signs BP 112/68 (BP Location: Right Wrist)   Pulse 86   Temp (!) 97.5 F (36.4 C) (Oral)   Resp (!) 25   Ht 5\' 5"  (1.651 m)   Wt 98.4 kg   LMP  (LMP Unknown)   SpO2 95%   BMI 36.11 kg/m  Physical Exam General: Normal appearing female, lying in bed.  HEENT: Sclera anicteric, MMM, trachea midline.  Cardiology: RRR, no murmurs/rubs/gallops. BL radial and DP pulses equal bilaterally.  Resp: Normal respiratory rate and effort. CTAB, no wheezes, rhonchi, crackles.  Abd: Soft, non-tender, non-distended. No rebound tenderness or guarding.  GU: Deferred. MSK: No peripheral edema or signs of trauma. Extremities without deformity or TTP. No cyanosis or clubbing. Skin: warm, dry. No rashes or lesions. Back: No CVA tenderness Neuro: A&Ox4, CNs II-XII grossly intact.  MAEs. Sensation grossly intact.  Psych: Normal mood and affect.   ED Results / Procedures / Treatments   Labs (all labs ordered are listed, but only abnormal results are displayed) Labs Reviewed  CBC WITH DIFFERENTIAL/PLATELET - Abnormal; Notable for the following components:      Result Value   Monocytes Absolute 1.2 (*)    All other components within normal limits  BASIC METABOLIC PANEL - Abnormal; Notable for the following components:   Sodium 127 (*)    Potassium 3.3 (*)     Chloride 91 (*)    Glucose, Bld 133 (*)    Calcium 8.1 (*)    All other components within normal limits  MAGNESIUM  BRAIN NATRIURETIC PEPTIDE  D-DIMER, QUANTITATIVE  TROPONIN I (HIGH SENSITIVITY)  TROPONIN I (HIGH SENSITIVITY)    EKG EKG Interpretation  Date/Time:  Thursday June 18 2022 07:33:31 EST Ventricular Rate:  85 PR Interval:  159 QRS Duration: 134 QT Interval:  386 QTC Calculation: 462 R Axis:   29 Text Interpretation: Sinus rhythm Left bundle branch block Similar to prior Confirmed by Cindee Lame [phone]) on 06/18/2022 9:19:54 AM  Radiology DG Chest 2 View  Result Date: 06/18/2022 CLINICAL DATA:  SOB EXAM: CHEST - 2 VIEW COMPARISON:  May 12, 2022 FINDINGS: The cardiomediastinal silhouette is unchanged in contour. Trace bilateral pleural effusions. No pneumothorax. Background of diffuse interstitial prominence, similar in comparison to prior. There is more confluent opacity in the RIGHT lateral mid lung and upper lung which are new in comparison to prior. Visualized abdomen is unremarkable. Multilevel degenerative changes of the thoracic spine. IMPRESSION: New confluent opacities in the RIGHT lateral mid lung and upper lung. Differential considerations include infection or superimposed atelectasis on a background of pulmonary edema versus chronic interstitial lung disease. Recommend follow-up PA and lateral chest radiograph 4 weeks after appropriate treatment to assess for resolution. Electronically Signed   By: Valentino Saxon M.D.   On: 06/18/2022 08:38    Procedures Procedures    Medications Ordered in ED Medications  iohexol (OMNIPAQUE) 350 MG/ML injection 100 mL (100 mLs Intravenous Contrast Given 06/18/22 1235)  furosemide (LASIX) injection 40 mg (40 mg Intravenous Given 06/18/22 1529)  potassium chloride SA (KLOR-CON M) CR tablet 40 mEq (40 mEq Oral Given 06/18/22 2040)    ED Course/ Medical Decision Making/ A&P                          Medical  Decision Making Amount and/or Complexity of Data Reviewed Labs: ordered. Decision-making details documented in ED Course. Radiology: ordered. Decision-making details documented in ED Course.  Risk Prescription drug management. Decision regarding hospitalization.    This patient presents to the ED for concern of SOB, this involves an extensive number of treatment options, and is a complaint that carries with it a high risk of complications and morbidity.  I considered the following differential and admission for this acute, potentially life threatening condition. Patient is overall well-appearing without any significant increased WOB or tachypnea.   MDM:    DDX for dyspnea includes but is not limited to:  Cardiac- CHF or volume  overload especially given report of stopping water pills recently, and mild LEE. Patient does not have any crackles on exam however. No CP to indicate Myocardial Ischemia but will eval w/ trop. Also consider arrhythmia or valvular heart disease such as AS but no significant murmur on exam.   Respiratory - Pneumonia esp w/ report of  productive cough;  atelectasis / pulmonary effusion, No wheezing to suggest COPD. Consider PE esp w/ h/o Afib but no thinners and possible LEE though no signs of DVT here on exam. Will obtain dimer and reeval.   Other - Sepsis, Anemia     Clinical Course as of 07/04/22 1657  Thu Jun 18, 2022  0918 Sodium(!): 127 Hyponatremia. Last labs in our system was 130 one month ago [HN]  0919 DG Chest 2 View FINDINGS: The cardiomediastinal silhouette is unchanged in contour. Trace bilateral pleural effusions. No pneumothorax. Background of diffuse interstitial prominence, similar in comparison to prior. There is more confluent opacity in the RIGHT lateral mid lung and upper lung which are new in comparison to prior. Visualized abdomen is unremarkable. Multilevel degenerative changes of the thoracic spine.  IMPRESSION: New confluent  opacities in the RIGHT lateral mid lung and upper lung. Differential considerations include infection or superimposed atelectasis on a background of pulmonary edema versus chronic interstitial lung disease. Recommend follow-up PA and lateral chest radiograph 4 weeks after appropriate treatment to assess for resolution.   [HN]  [id number] Troponin I (High Sensitivity): 11 [HN]  0921 Magnesium: 1.8 [HN]  0921 CBC with Differential(!) unremarkable [HN]  1002 B Natriuretic Peptide: 50.2 [HN]  1214 D-Dimer, Quant(!): 1.13 Will order CT PE [HN]  1329 D/w radiology about CT PE. No pericardial effusion. No opacities that would indicate PNA. Minimal R pleural effusion that I do not think would be significant enough to cause patient's symptoms. No PE. Some atelectasis/scarring likely from lobectomy. No emergent findings. [HN]  3810 Went to discuss with patient her results and at rest her SpO2 while awake is 87 to 88%.  Placed on 2 L nasal cannula now 96%.  Fine crackles still heard in the lungs.  Clinically in the setting of stopping her diuretic a few days ago though the chest x-ray and BNP do not demonstrate fluid overload, I do believe she has some pulmonary edema and could benefit from diuresis, but will need to monitor sodium and now requiring 2l [place]. Patient will be consulted to hosptialist. [HN]  1500 Patient is given 40 mg IV lasix for diuresis. She is currently on 2l [place]. Admitted to medicine for diuresis and obs. [HN]    Clinical Course User Index [HN] Audley Hose, MD    Labs: I Ordered, and personally interpreted labs.  The pertinent results include:  those listed above  Imaging Studies ordered: I ordered imaging studies including CXR, CT PE I independently visualized and interpreted imaging. I agree with the radiologist interpretation  Additional history obtained from chart review, daughter at bedside.   Cardiac Monitoring: The patient was maintained on a cardiac monitor.  I personally  viewed and interpreted the cardiac monitored which showed an underlying rhythm of: NSR  Reevaluation: After the interventions noted above, I reevaluated the patient and found that they have :stayed the same  Social Determinants of Health: Patient lives independently   Disposition:  Admit  Co morbidities that complicate the patient evaluation  Past Medical History:  Diagnosis Date   Benign essential HTN 10/31/2015   Cardiac tamponade due to viral pericarditis    LBBB (left bundle branch block)    Lung mass    fungal infection s/p resection at [facility]   Postoperative atrial fibrillation ([place])    Pulmonary nodule      Medicines No orders of the defined types were placed in this encounter.   I have reviewed the patients home medicines  and have made adjustments as needed  Problem List / ED Course: Problem List Items Addressed This Visit       Other   Shortness of breath - Primary                This note was created using dictation software, which may contain spelling or grammatical errors.    Audley Hose, MD 07/04/22 [phone]

## 2022-06-18 NOTE — Assessment & Plan Note (Signed)
Suspected to be due to HCTZ, stopped on Monday by PCP. Sodium of 127 today is actually up from 126 on PCP labs. Monitor sodium with daily BMP while here.

## 2022-06-19 ENCOUNTER — Ambulatory Visit (HOSPITAL_COMMUNITY): Payer: Medicare Other

## 2022-06-19 ENCOUNTER — Encounter: Payer: Medicare Other | Admitting: Dietician

## 2022-06-19 DIAGNOSIS — E871 Hypo-osmolality and hyponatremia: Secondary | ICD-10-CM | POA: Diagnosis present

## 2022-06-19 DIAGNOSIS — Z87891 Personal history of nicotine dependence: Secondary | ICD-10-CM | POA: Diagnosis not present

## 2022-06-19 DIAGNOSIS — E8809 Other disorders of plasma-protein metabolism, not elsewhere classified: Secondary | ICD-10-CM | POA: Diagnosis present

## 2022-06-19 DIAGNOSIS — J189 Pneumonia, unspecified organism: Secondary | ICD-10-CM | POA: Diagnosis present

## 2022-06-19 DIAGNOSIS — B402 Pulmonary blastomycosis, unspecified: Secondary | ICD-10-CM | POA: Diagnosis present

## 2022-06-19 DIAGNOSIS — I5033 Acute on chronic diastolic (congestive) heart failure: Secondary | ICD-10-CM | POA: Diagnosis present

## 2022-06-19 DIAGNOSIS — E119 Type 2 diabetes mellitus without complications: Secondary | ICD-10-CM | POA: Diagnosis present

## 2022-06-19 DIAGNOSIS — R627 Adult failure to thrive: Secondary | ICD-10-CM | POA: Diagnosis present

## 2022-06-19 DIAGNOSIS — Z1152 Encounter for screening for COVID-19: Secondary | ICD-10-CM | POA: Diagnosis not present

## 2022-06-19 DIAGNOSIS — I110 Hypertensive heart disease with heart failure: Secondary | ICD-10-CM | POA: Diagnosis present

## 2022-06-19 DIAGNOSIS — E876 Hypokalemia: Secondary | ICD-10-CM | POA: Diagnosis present

## 2022-06-19 DIAGNOSIS — E440 Moderate protein-calorie malnutrition: Secondary | ICD-10-CM | POA: Diagnosis present

## 2022-06-19 DIAGNOSIS — J9601 Acute respiratory failure with hypoxia: Secondary | ICD-10-CM | POA: Diagnosis present

## 2022-06-19 DIAGNOSIS — Z902 Acquired absence of lung [part of]: Secondary | ICD-10-CM | POA: Diagnosis not present

## 2022-06-19 DIAGNOSIS — Z91040 Latex allergy status: Secondary | ICD-10-CM | POA: Diagnosis not present

## 2022-06-19 DIAGNOSIS — Z6836 Body mass index (BMI) 36.0-36.9, adult: Secondary | ICD-10-CM | POA: Diagnosis not present

## 2022-06-19 DIAGNOSIS — Z8249 Family history of ischemic heart disease and other diseases of the circulatory system: Secondary | ICD-10-CM | POA: Diagnosis not present

## 2022-06-19 DIAGNOSIS — R0602 Shortness of breath: Secondary | ICD-10-CM | POA: Diagnosis present

## 2022-06-19 DIAGNOSIS — I480 Paroxysmal atrial fibrillation: Secondary | ICD-10-CM | POA: Diagnosis present

## 2022-06-19 DIAGNOSIS — T502X5A Adverse effect of carbonic-anhydrase inhibitors, benzothiadiazides and other diuretics, initial encounter: Secondary | ICD-10-CM | POA: Diagnosis present

## 2022-06-19 DIAGNOSIS — Z880 Allergy status to penicillin: Secondary | ICD-10-CM | POA: Diagnosis not present

## 2022-06-19 DIAGNOSIS — Z90710 Acquired absence of both cervix and uterus: Secondary | ICD-10-CM | POA: Diagnosis not present

## 2022-06-19 DIAGNOSIS — Z91012 Allergy to eggs: Secondary | ICD-10-CM | POA: Diagnosis not present

## 2022-06-19 DIAGNOSIS — R52 Pain, unspecified: Secondary | ICD-10-CM | POA: Diagnosis not present

## 2022-06-19 DIAGNOSIS — I10 Essential (primary) hypertension: Secondary | ICD-10-CM | POA: Diagnosis not present

## 2022-06-19 DIAGNOSIS — Z882 Allergy status to sulfonamides status: Secondary | ICD-10-CM | POA: Diagnosis not present

## 2022-06-19 DIAGNOSIS — R54 Age-related physical debility: Secondary | ICD-10-CM | POA: Diagnosis present

## 2022-06-19 LAB — URINALYSIS, ROUTINE W REFLEX MICROSCOPIC
Bilirubin Urine: NEGATIVE
Glucose, UA: NEGATIVE mg/dL
Hgb urine dipstick: NEGATIVE
Ketones, ur: NEGATIVE mg/dL
Leukocytes,Ua: NEGATIVE
Nitrite: NEGATIVE
Protein, ur: 100 mg/dL — AB
Specific Gravity, Urine: 1.012 (ref 1.005–1.030)
pH: 7 (ref 5.0–8.0)

## 2022-06-19 LAB — ECHOCARDIOGRAM COMPLETE
AR max vel: 2.01 cm2
AV Area VTI: 2.1 cm2
AV Area mean vel: 1.79 cm2
AV Mean grad: 6 mmHg
AV Peak grad: 10 mmHg
Ao pk vel: 1.58 m/s
Area-P 1/2: 3.78 cm2
Calc EF: 41.8 %
Height: 65 in
S' Lateral: 2.4 cm
Single Plane A2C EF: 44.9 %
Single Plane A4C EF: 40.2 %
Weight: 3472 oz

## 2022-06-19 LAB — CBC
HCT: 36.2 % (ref 36.0–46.0)
Hemoglobin: 12.4 g/dL (ref 12.0–15.0)
MCH: 28.4 pg (ref 26.0–34.0)
MCHC: 34.3 g/dL (ref 30.0–36.0)
MCV: 82.8 fL (ref 80.0–100.0)
Platelets: 155 10*3/uL (ref 150–400)
RBC: 4.37 MIL/uL (ref 3.87–5.11)
RDW: 14.3 % (ref 11.5–15.5)
WBC: 7.7 10*3/uL (ref 4.0–10.5)
nRBC: 0 % (ref 0.0–0.2)

## 2022-06-19 LAB — COMPREHENSIVE METABOLIC PANEL
ALT: 34 U/L (ref 0–44)
AST: 58 U/L — ABNORMAL HIGH (ref 15–41)
Albumin: 1.7 g/dL — ABNORMAL LOW (ref 3.5–5.0)
Alkaline Phosphatase: 76 U/L (ref 38–126)
Anion gap: 8 (ref 5–15)
BUN: 7 mg/dL — ABNORMAL LOW (ref 8–23)
CO2: 28 mmol/L (ref 22–32)
Calcium: 8.3 mg/dL — ABNORMAL LOW (ref 8.9–10.3)
Chloride: 95 mmol/L — ABNORMAL LOW (ref 98–111)
Creatinine, Ser: 0.7 mg/dL (ref 0.44–1.00)
GFR, Estimated: >60 mL/min (ref >60)
Glucose, Bld: 109 mg/dL — ABNORMAL HIGH (ref 70–99)
Potassium: 3.6 mmol/L (ref 3.5–5.1)
Sodium: 131 mmol/L — ABNORMAL LOW (ref 135–145)
Total Bilirubin: 0.9 mg/dL (ref 0.3–1.2)
Total Protein: 5.7 g/dL — ABNORMAL LOW (ref 6.5–8.1)

## 2022-06-19 LAB — RESPIRATORY PANEL BY PCR

## 2022-06-19 LAB — GLUCOSE, CAPILLARY
Glucose-Capillary: 120 mg/dL — ABNORMAL HIGH (ref 70–99)
Glucose-Capillary: 111 mg/dL — ABNORMAL HIGH (ref 70–99)
Glucose-Capillary: 104 mg/dL — ABNORMAL HIGH (ref 70–99)
Glucose-Capillary: 119 mg/dL — ABNORMAL HIGH (ref 70–99)

## 2022-06-19 LAB — RESP PANEL BY RT-PCR (RSV, FLU A&B, COVID)  RVPGX2
Influenza A by PCR: NEGATIVE
Influenza B by PCR: NEGATIVE
Resp Syncytial Virus by PCR: NEGATIVE
SARS Coronavirus 2 by RT PCR: NEGATIVE

## 2022-06-19 LAB — MAGNESIUM: Magnesium: 1.9 mg/dL (ref 1.7–2.4)

## 2022-06-19 LAB — HEMOGLOBIN A1C
Hgb A1c MFr Bld: 6.9 % — ABNORMAL HIGH (ref 4.8–5.6)
Mean Plasma Glucose: 151.33 mg/dL

## 2022-06-19 MED ORDER — DOXYCYCLINE HYCLATE 100 MG PO TABS
100.0000 mg | ORAL_TABLET | Freq: Two times a day (BID) | ORAL | Status: DC
  Administered 2022-06-19 (×2): 100 mg via ORAL
  Filled 2022-06-19 (×2): qty 1

## 2022-06-19 MED ORDER — INSULIN ASPART 100 UNIT/ML IJ SOLN
0.0000 [IU] | Freq: Three times a day (TID) | INTRAMUSCULAR | Status: DC
  Administered 2022-06-20 – 2022-06-24 (×2): 1 [IU] via SUBCUTANEOUS
  Administered 2022-06-25: 2 [IU] via SUBCUTANEOUS
  Administered 2022-06-25 – 2022-06-26 (×2): 1 [IU] via SUBCUTANEOUS

## 2022-06-19 MED ORDER — POTASSIUM CHLORIDE CRYS ER 20 MEQ PO TBCR
20.0000 meq | EXTENDED_RELEASE_TABLET | Freq: Once | ORAL | Status: AC
  Administered 2022-06-19: 20 meq via ORAL
  Filled 2022-06-19: qty 1

## 2022-06-19 MED ORDER — PERFLUTREN LIPID MICROSPHERE
1.0000 mL | INTRAVENOUS | Status: AC | PRN
  Administered 2022-06-19: 2 mL via INTRAVENOUS

## 2022-06-19 MED ORDER — FUROSEMIDE 10 MG/ML IJ SOLN
40.0000 mg | Freq: Every day | INTRAMUSCULAR | Status: DC
  Administered 2022-06-19 – 2022-06-21 (×3): 40 mg via INTRAVENOUS
  Filled 2022-06-19 (×3): qty 4

## 2022-06-19 NOTE — Care Management Obs Status (Signed)
[place] NOTIFICATION   Patient Details  Name: Barbara Nelson MRN: [id number] Date of Birth: 1940/09/01   Medicare Observation Status Notification Given:       Levonne Lapping, RN 06/19/2022, 9:18 AM

## 2022-06-19 NOTE — TOC Initial Note (Signed)
Transition of Care [facility]) - Initial/Assessment Note    Patient Details  Name: Barbara Nelson MRN: [id number] Date of Birth: [birth year]  Transition of [facility]) CM/SW Contact:    Benard Halsted, LCSW Phone Number: 06/19/2022, 4:52 PM  Clinical Narrative:                 CSW received consult for possible SNF at time of discharge. CSW spoke with patient and daughter at bedside. Patient/daughter reported that she would like to resume home health services with [facility] rather than SNF. CSW discussed equipment needs and daughter reported having a rolling walker. CSW confirmed PCP and address. Daughter reported that patient was up walking yesterday and is hopeful for patient to get back to that point after being sick. Patient was not on oxygen at home. TOC to follow up with home health resumption orders at discharge.    Expected Discharge Plan: [place] Barriers to Discharge: Continued Medical Work up   Patient Goals and CMS Choice Patient states their goals for this hospitalization and ongoing recovery are:: Return home CMS Medicare.gov Compare Post Acute Care list provided to:: Patient Choice offered to / list presented to : Patient, Adult [place] ownership interest in [facility].provided to:: Adult Children    Expected Discharge Plan and Services In-house Referral: Clinical Social Work   Post Acute Care Choice: Resumption of Svcs/PTA Provider Living arrangements for the past 2 months: Single Family Home                                      Prior Living Arrangements/Services Living arrangements for the past 2 months: Single Family Home Lives with:: Adult Children Patient language and need for interpreter reviewed:: Yes Do you feel safe going back to the place where you live?: Yes      Need for Family Participation in Patient Care: Yes (Comment) Care giver support system in place?: Yes (comment) Current home services: DME, Home  PT, Home OT (walker) Criminal Activity/Legal Involvement Pertinent to Current Situation/Hospitalization: No - Comment as needed  Activities of Daily Living Home Assistive Devices/Equipment: Environmental consultant (specify type), Cane (specify quad or straight) ADL Screening (condition at time of admission) Patient's cognitive ability adequate to safely complete daily activities?: Yes Is the patient deaf or have difficulty hearing?: No Does the patient have difficulty seeing, even when wearing glasses/contacts?: No Does the patient have difficulty concentrating, remembering, or making decisions?: No Patient able to express need for assistance with ADLs?: Yes Does the patient have difficulty dressing or bathing?: Yes Independently performs ADLs?: No Communication: Appropriate for developmental age Dressing (OT): Needs assistance Is this a change from baseline?: Pre-admission baseline Grooming: Needs assistance Is this a change from baseline?: Pre-admission baseline Feeding: Needs assistance Is this a change from baseline?: Pre-admission baseline Bathing: Needs assistance Is this a change from baseline?: Pre-admission baseline Toileting: Needs assistance Is this a change from baseline?: Pre-admission baseline In/Out Bed: Needs assistance Is this a change from baseline?: Pre-admission baseline Walks in Home: Needs assistance Is this a change from baseline?: Pre-admission baseline Does the patient have difficulty walking or climbing stairs?: Yes Weakness of Legs: Both Weakness of Arms/Hands: Both  Permission Sought/Granted Permission sought to share information with : Facility Sport and exercise psychologist, Family Supports Permission granted to share information with : Yes, Verbal Permission Granted  Share Information with NAME: Freda Munro  Permission granted to  share info w AGENCY: [facility]  Permission granted to share info w Relationship: Daughter  Permission granted to share info w Contact Information:  [phone]  Emotional Assessment Appearance:: Appears stated age Attitude/Demeanor/Rapport: Gracious Affect (typically observed): Accepting, Appropriate, Quiet Orientation: : Oriented to Self, Oriented to Place, Oriented to  Time, Oriented to Situation Alcohol / Substance Use: Not Applicable Psych Involvement: No (comment)  Admission diagnosis:  Shortness of breath [R06.02] Acute respiratory failure with hypoxia (HCC) [J96.01] Patient Active Problem List   Diagnosis Date Noted   Acute respiratory failure with hypoxia (HCC) 06/18/2022   Hyponatremia 06/18/2022   Obesity 01/05/2022   Decreased estrogen level 01/05/2022   Prediabetes 01/05/2022   Dry eyes 01/05/2022   History of pericarditis 01/05/2022   Hypertension 01/05/2022   LBBB (left bundle branch block) 09/17/2017   Benign essential HTN 10/31/2015   Heart palpitations 10/31/2015   Lung mass 06/27/2013   Edema of extremities 06/27/2013   Pericarditis, viral    Postoperative atrial fibrillation ([place])    History of pericardiectomy 06/19/2013   Chronic pulmonary blastomycosis ([place]) 08/23/2012   Pulmonary nodule, right 03/09/2012   Pericardial effusion/ pericarditis 10/06/2011   PCP:  Cari Caraway, MD Pharmacy:   [id number] - [place], [place] - [address] [address] Phone: [phone] Fax: [phone]     Social Determinants of Health (SDOH) Social History: SDOH Screenings   Food Insecurity: No Food Insecurity (06/18/2022)  Housing: Low Risk  (06/18/2022)  Transportation Needs: No Transportation Needs (06/18/2022)  Utilities: Not At Risk (06/18/2022)  Tobacco Use: Medium Risk (06/18/2022)   SDOH Interventions:     Readmission Risk Interventions     No data to display

## 2022-06-19 NOTE — Progress Notes (Signed)
  Echocardiogram 2D Echocardiogram has been performed.  Lana Fish 06/19/2022, 10:54 AM

## 2022-06-19 NOTE — Evaluation (Signed)
Physical Therapy Evaluation Patient Details Name: Barbara Nelson MRN: [id number] DOB: 12/22/1940 Today's Date: 06/19/2022  History of Present Illness  82 y.o. female presents to [facility] hospital on 06/18/2022 with productive cough and SOB. Pt found to be hyponatremic. PMH includes HTN, PAF, mycetoma s/p LML lobectomy of lung, Pericardectomy for tamponade from viral pericarditis.  Clinical Impression  Pt presents to PT with deficits in activity tolerance, strength, power, gait, balance, endurance. Pt is generally weak, requiring physical assistance to perform all functional mobility tasks. Pt has difficulty ascending into standing and benefits from physical assistance and verbal cues to improve technique. Pt also demonstrates elevated RR and reports SOB when mobilizing, both on and off supplemental oxygen. Pt will benefit from aggressive mobilization in an effort to improve strength and activity tolerance. PT recommends SNF placement at this time as the pt is requiring increased assistance to mobilize compared to baseline.     Recommendations for follow up therapy are one component of a multi-disciplinary discharge planning process, led by the attending physician.  Recommendations may be updated based on patient status, additional functional criteria and insurance authorization.  Follow Up Recommendations Skilled nursing-short term rehab (<3 hours/day) (patient and family may elect to discharge home with HHPT if family feel comfortable providing assitance needed for mobility) Can patient physically be transported by private vehicle: Yes    Assistance Recommended at Discharge Intermittent Supervision/Assistance  Patient can return home with the following  A little help with walking and/or transfers;A lot of help with bathing/dressing/bathroom;Assistance with cooking/housework;Assist for transportation;Help with stairs or ramp for entrance    Equipment Recommendations BSC/3in1  Recommendations for  Other Services       Functional Status Assessment Patient has had a recent decline in their functional status and demonstrates the ability to make significant improvements in function in a reasonable and predictable amount of time.     Precautions / Restrictions Precautions Precautions: Fall Restrictions Weight Bearing Restrictions: No      Mobility  Bed Mobility Overal bed mobility: Needs Assistance Bed Mobility: Supine to Sit     Supine to sit: Min assist, HOB elevated          Transfers Overall transfer level: Needs assistance Equipment used: Rolling walker (2 wheels) Transfers: Sit to/from Stand, Bed to chair/wheelchair/BSC Sit to Stand: Mod assist, Min assist   Step pivot transfers: Min assist       General transfer comment: pt requires modA to ascend from low commode, modA for initial stand from elevated bed due to posterior lean, minA for other 2 stands from elevated bed. PT provides cues to increase trunk flexion in an effort to reduce load on quads and LE musculature.    Ambulation/Gait Ambulation/Gait assistance: Min guard Gait Distance (Feet): 15 Feet (15' x 2 trials) Assistive device: Rolling walker (2 wheels) Gait Pattern/deviations: Step-to pattern Gait velocity: reduced Gait velocity interpretation: <1.31 ft/sec, indicative of household ambulator   General Gait Details: slowed step-to gait, increased trunk flexion  Stairs            Wheelchair Mobility    Modified Rankin (Stroke Patients Only)       Balance Overall balance assessment: Needs assistance Sitting-balance support: No upper extremity supported, Feet supported Sitting balance-Leahy Scale: Fair     Standing balance support: Bilateral upper extremity supported, Reliant on assistive device for balance Standing balance-Leahy Scale: Poor Standing balance comment: minG  Pertinent Vitals/Pain Pain Assessment Pain Assessment:  Faces Faces Pain Scale: Hurts little more Pain Location: back Pain Descriptors / Indicators: Grimacing Pain Intervention(s): Monitored during session    Home Living Family/patient expects to be discharged to:: Private residence Living Arrangements: Children Available Help at Discharge: Family;Available 24 hours/day Type of Home: House Home Access: Stairs to enter Entrance Stairs-Rails: Can reach both Entrance Stairs-Number of Steps: 1   Home Layout: Multi-level;Able to live on main level with bedroom/bathroom Home Equipment: Toilet riser;Grab bars - toilet;Grab bars - tub/shower;Other (comment) (standard walker (no wheels))      Prior Function Prior Level of Function : Needs assist             Mobility Comments: pt ambulates for household distances with use of standard walker and supervision ADLs Comments: pt requires assistance for bathing, dressing, IADLs. Pt does perform portions of bathing and dressing.     Hand Dominance        Extremity/Trunk Assessment   Upper Extremity Assessment Upper Extremity Assessment: Generalized weakness    Lower Extremity Assessment Lower Extremity Assessment: Generalized weakness    Cervical / Trunk Assessment Cervical / Trunk Assessment: Kyphotic  Communication   Communication: No difficulties  Cognition Arousal/Alertness: Awake/alert Behavior During Therapy: Flat affect Overall Cognitive Status: Impaired/Different from baseline Area of Impairment: Problem solving                             Problem Solving: Slow processing          General Comments General comments (skin integrity, edema, etc.): pt sats range from 86-91% when on room air and mobilizing. Pt sats in low 90s on 2L  at rest    Exercises     Assessment/Plan    PT Assessment Patient needs continued PT services  PT Problem List Decreased strength;Decreased activity tolerance;Decreased balance;Decreased mobility;Decreased knowledge of use of  DME;Cardiopulmonary status limiting activity       PT Treatment Interventions DME instruction;Gait training;Functional mobility training;Stair training;Therapeutic activities;Therapeutic exercise;Balance training;Neuromuscular re-education;Patient/family education    PT Goals (Current goals can be found in the Care Plan section)  Acute Rehab PT Goals Patient Stated Goal: to improve strength, reduced assistance requirements when mobilizing PT Goal Formulation: With patient/family Time For Goal Achievement: 07/03/22 Potential to Achieve Goals: Good    Frequency Min 3X/week     Co-evaluation               AM-PAC PT "6 Clicks" Mobility  Outcome Measure Help needed turning from your back to your side while in a flat bed without using bedrails?: A Little Help needed moving from lying on your back to sitting on the side of a flat bed without using bedrails?: A Little Help needed moving to and from a bed to a chair (including a wheelchair)?: A Little Help needed standing up from a chair using your arms (e.g., wheelchair or bedside chair)?: A Little Help needed to walk in hospital room?: A Lot Help needed climbing 3-5 steps with a railing? : Total 6 Click Score: 15    End of Session Equipment Utilized During Treatment: Oxygen Activity Tolerance: Patient limited by fatigue Patient left: in chair;with call bell/phone within reach;with family/visitor present Nurse Communication: Mobility status PT Visit Diagnosis: Other abnormalities of gait and mobility (R26.89);Muscle weakness (generalized) (M62.81)    Time: 7124-5809 PT Time Calculation (min) (ACUTE ONLY): 38 min   Charges:   PT Evaluation $PT Eval Low Complexity:  1 Low PT Treatments $Therapeutic Activity: 8-22 mins        Zenaida Niece, PT, DPT Acute Rehabilitation Office [place] 06/19/2022, 2:05 PM

## 2022-06-19 NOTE — Progress Notes (Signed)
PROGRESS NOTE        PATIENT DETAILS Name: Barbara Nelson Age: 82 y.o. Sex: female Date of Birth: [birth year] Admit Date: 06/18/2022 Admitting Physician Karmen Bongo, MD [id number], Abigail Butts, MD  Brief Summary: Patient is a 82 y.o.  female with history of HTN, prior pericardiectomy for tamponade from viral pericarditis-presented with 2-3-day history of exertional dyspnea-found to have mild acute hypoxic respiratory failure requiring around 2 L of oxygen to maintain O2 saturation above 88%.  Significant events: 2/1>> admit to [facility]-hypoxia-suspected CHF exacerbation.  Admit to [facility].  Significant studies: 2/1>> CT angio chest: No PE, minimal right pleural effusion, bibasilar subsegmental atelectasis.  Significant microbiology data: 2/2>> COVID/influenza/RSV PCR: Negative 2/2>> respiratory virus panel: Negative  Procedures: None  Consults: None  Subjective: Lying comfortably in bed-on 2 L of oxygen When O2 removed-O2 saturation drops down to 88%. No shortness of breath at rest but still somewhat dyspneic when walking to the bathroom. Has leg edema for the past several days. Continues to have dry cough-low-grade fever overnight.  Objective: Vitals: Blood pressure 117/69, pulse 87, temperature 98.4 F (36.9 C), temperature source Oral, resp. rate 20, height 5\' 5"  (1.651 m), weight 98.4 kg, SpO2 93 %.   Exam: Gen Exam:Alert awake-not in any distress HEENT:atraumatic, normocephalic Chest: Few bibasilar rales. CVS:S1S2 regular Abdomen:soft non tender, non distended Extremities:++ edema Neurology: Non focal Skin: no rash  Pertinent Labs/Radiology:    Latest Ref Rng & Units 06/19/2022    6:57 AM 06/18/2022    8:03 AM 05/13/2022   12:13 AM  CBC  WBC 4.0 - 10.5 K/uL 7.7  6.6  6.0   Hemoglobin 12.0 - 15.0 g/dL 12.4  13.0  14.1   Hematocrit 36.0 - 46.0 % 36.2  38.3  42.1   Platelets 150 - 400 K/uL 155  171  128     Lab Results  Component Value  Date   NA 131 (L) 06/19/2022   K 3.6 06/19/2022   CL 95 (L) 06/19/2022   CO2 28 06/19/2022     Assessment/Plan: Acute hypoxic respiratory failure Suspect this is HFpEF exacerbation-has leg edema/exertional dyspnea.  May have some atypical PNA as well (low-grade fever this morning-some minimal parenchymal atelectasis/infiltrates seen on CT) Will place on scheduled IV Lasix daily Start empiric doxy for atypical PNA Check echo Mobilize/ambulate Attempt to titrate down FiO2 If no improvement with these measures-will need outpatient PFTs-prior history of smoking.  Hyponatremia Multifactorial-due to HCTZ/volume overload from perhaps underlying CHF Continue IV Lasix-sodium levels improving Fluid restriction  HFpEF exacerbation See above Await echo  CAP See above Given low-grade fever-cough-mild hypoxemia-will empirically treat with doxy  Hypoalbuminemia Significant drop in albumin levels this morning-suspect lab error Check UA to ensure no significant proteinuria Check albumin levels with a.m. labs  HTN BP stable Continue metoprolol  DM-2 (A1c 6.9 on 2/2) Metformin on hold Monitor CBGs on SSI  Recent Labs    06/18/22 2230 06/19/22 0851  GLUCAP 138* 120*   Obesity: Estimated body mass index is 36.11 kg/m as calculated from the following:   Height as of this encounter: 5\' 5"  (1.651 m).   Weight as of this encounter: 98.4 kg.   Code status:   Code Status: Full Code   DVT Prophylaxis: enoxaparin (LOVENOX) injection 40 mg Start: 06/19/22 1000   Family Communication: Daughter at bedside   Disposition Plan: Status  is: Observation The patient will require care spanning > 2 midnights and should be moved to inpatient because: Severity of illness   Planned Discharge Destination:Home   Diet: Diet Order             Diet heart healthy/carb modified Room service appropriate? Yes; Fluid consistency: Thin  Diet effective now                      Antimicrobial agents: Anti-infectives (From admission, onward)    None        MEDICATIONS: Scheduled Meds:  enoxaparin (LOVENOX) injection  40 mg Subcutaneous Q24H   furosemide  40 mg Intravenous Daily   insulin aspart  0-9 Units Subcutaneous TID WC   metoprolol succinate  25 mg Oral Daily   polyvinyl alcohol  1 drop Both Eyes TID   pregabalin  25 mg Oral QHS   Continuous Infusions: PRN Meds:.acetaminophen **OR** acetaminophen, ondansetron **OR** ondansetron (ZOFRAN) IV, perflutren lipid microspheres (DEFINITY) IV suspension   I have personally reviewed following labs and imaging studies  LABORATORY DATA: CBC: Recent Labs  Lab 06/18/22 0803 06/19/22 0657  WBC 6.6 7.7  NEUTROABS 4.4  --   HGB 13.0 12.4  HCT 38.3 36.2  MCV 82.2 82.8  PLT 171 226    Basic Metabolic Panel: Recent Labs  Lab 06/18/22 0803 06/19/22 0657  NA 127* 131*  K 3.3* 3.6  CL 91* 95*  CO2 28 28  GLUCOSE 133* 109*  BUN 8 7*  CREATININE 0.71 0.70  CALCIUM 8.1* 8.3*  MG 1.8 1.9    GFR: Estimated Creatinine Clearance: 63 mL/min (by C-G formula based on SCr of 0.7 mg/dL).  Liver Function Tests: Recent Labs  Lab 06/19/22 0657  AST 58*  ALT 34  ALKPHOS 76  BILITOT 0.9  PROT 5.7*  ALBUMIN 1.7*   No results for input(s): "LIPASE", "AMYLASE" in the last 168 hours. No results for input(s): "AMMONIA" in the last 168 hours.  Coagulation Profile: No results for input(s): "INR", "PROTIME" in the last 168 hours.  Cardiac Enzymes: No results for input(s): "CKTOTAL", "CKMB", "CKMBINDEX", "TROPONINI" in the last 168 hours.  BNP (last 3 results) No results for input(s): "PROBNP" in the last 8760 hours.  Lipid Profile: No results for input(s): "CHOL", "HDL", "LDLCALC", "TRIG", "CHOLHDL", "LDLDIRECT" in the last 72 hours.  Thyroid Function Tests: No results for input(s): "TSH", "T4TOTAL", "FREET4", "T3FREE", "THYROIDAB" in the last 72 hours.  Anemia Panel: No results for  input(s): "VITAMINB12", "FOLATE", "FERRITIN", "TIBC", "IRON", "RETICCTPCT" in the last 72 hours.  Urine analysis:    Component Value Date/Time   COLORURINE YELLOW 09/15/2011 0530   APPEARANCEUR CLEAR 09/15/2011 0530   LABSPEC 1.016 09/15/2011 0530   PHURINE 6.5 09/15/2011 0530   GLUCOSEU NEGATIVE 09/15/2011 0530   HGBUR NEGATIVE 09/15/2011 0530   BILIRUBINUR NEGATIVE 09/15/2011 0530   KETONESUR NEGATIVE 09/15/2011 0530   PROTEINUR NEGATIVE 09/15/2011 0530   UROBILINOGEN 0.2 09/15/2011 0530   NITRITE NEGATIVE 09/15/2011 0530   LEUKOCYTESUR SMALL (A) 09/15/2011 0530    Sepsis Labs: Lactic Acid, Venous No results found for: "LATICACIDVEN"  MICROBIOLOGY: Recent Results (from the past 240 hour(s))  Resp panel by RT-PCR (RSV, Flu A&B, Covid) Anterior Nasal Swab     Status: None   Collection Time: 06/19/22 12:25 AM   Specimen: Anterior Nasal Swab  Result Value Ref Range Status   SARS Coronavirus 2 by RT PCR NEGATIVE NEGATIVE Final   Influenza A by PCR NEGATIVE NEGATIVE  Final   Influenza B by PCR NEGATIVE NEGATIVE Final    Comment: (NOTE) The Xpert Xpress SARS-CoV-2/FLU/RSV plus assay is intended as an aid in the diagnosis of influenza from Nasopharyngeal swab specimens and should not be used as a sole basis for treatment. Nasal washings and aspirates are unacceptable for Xpert Xpress SARS-CoV-2/FLU/RSV testing.  Fact Sheet for Patients: EntrepreneurPulse.com.au  Fact Sheet for Healthcare Providers: IncredibleEmployment.be  This test is not yet approved or cleared by the [place] FDA and has been authorized for detection and/or diagnosis of SARS-CoV-2 by FDA under an Emergency Use Authorization (EUA). This EUA will remain in effect (meaning this test can be used) for the duration of the COVID-19 declaration under Section 564(b)(1) of the Act, 21 U.S.C. section 360bbb-3(b)(1), unless the authorization is terminated or revoked.      Resp Syncytial Virus by PCR NEGATIVE NEGATIVE Final    Comment: (NOTE) Fact Sheet for Patients: EntrepreneurPulse.com.au  Fact Sheet for Healthcare Providers: IncredibleEmployment.be  This test is not yet approved or cleared by the [place] FDA and has been authorized for detection and/or diagnosis of SARS-CoV-2 by FDA under an Emergency Use Authorization (EUA). This EUA will remain in effect (meaning this test can be used) for the duration of the COVID-19 declaration under Section 564(b)(1) of the Act, 21 U.S.C. section 360bbb-3(b)(1), unless the authorization is terminated or revoked.  Performed at [facility], [place] [address], [place], [place] [address]   Respiratory (~20 pathogens) panel by PCR     Status: None   Collection Time: 06/19/22 12:25 AM   Specimen: Nasopharyngeal Swab; Respiratory  Result Value Ref Range Status   Adenovirus NOT DETECTED NOT DETECTED Final   Coronavirus 229E NOT DETECTED NOT DETECTED Final    Comment: (NOTE) The Coronavirus on the Respiratory Panel, DOES NOT test for the novel  Coronavirus (2019 nCoV)    Coronavirus HKU1 NOT DETECTED NOT DETECTED Final   Coronavirus NL63 NOT DETECTED NOT DETECTED Final   Coronavirus OC43 NOT DETECTED NOT DETECTED Final   Metapneumovirus NOT DETECTED NOT DETECTED Final   Rhinovirus / Enterovirus NOT DETECTED NOT DETECTED Final   Influenza A NOT DETECTED NOT DETECTED Final   Influenza B NOT DETECTED NOT DETECTED Final   Parainfluenza Virus 1 NOT DETECTED NOT DETECTED Final   Parainfluenza Virus 2 NOT DETECTED NOT DETECTED Final   Parainfluenza Virus 3 NOT DETECTED NOT DETECTED Final   Parainfluenza Virus 4 NOT DETECTED NOT DETECTED Final   Respiratory Syncytial Virus NOT DETECTED NOT DETECTED Final   Bordetella pertussis NOT DETECTED NOT DETECTED Final   Bordetella Parapertussis NOT DETECTED NOT DETECTED Final   Chlamydophila pneumoniae NOT DETECTED NOT DETECTED  Final   Mycoplasma pneumoniae NOT DETECTED NOT DETECTED Final    Comment: Performed at [facility], [place]. [address] [place], [place] [address]    RADIOLOGY STUDIES/RESULTS: CT Angio Chest PE W and/or Wo Contrast  Result Date: 06/18/2022 CLINICAL DATA:  Shortness of breath. EXAM: CT ANGIOGRAPHY CHEST WITH CONTRAST TECHNIQUE: Multidetector CT imaging of the chest was performed using the standard protocol during bolus administration of intravenous contrast. Multiplanar CT image reconstructions and MIPs were obtained to evaluate the vascular anatomy. RADIATION DOSE REDUCTION: This exam was performed according to the departmental dose-optimization program which includes automated exposure control, adjustment of the mA and/or kV according to patient size and/or use of iterative reconstruction technique. CONTRAST:  142mL OMNIPAQUE IOHEXOL 350 MG/ML SOLN COMPARISON:  September 12, 2015. FINDINGS: Cardiovascular: Satisfactory  opacification of the pulmonary arteries to the segmental level. No evidence of pulmonary embolism. Mild cardiomegaly. No pericardial effusion. Mediastinum/Nodes: No enlarged mediastinal, hilar, or axillary lymph nodes. Thyroid gland, trachea, and esophagus demonstrate no significant findings. Lungs/Pleura: Minimal right pleural effusion is noted. No pneumothorax is noted. Minimal bibasilar subsegmental atelectasis is noted. Stable probable scarring is seen in right midlung related to prior lobectomy. Upper Abdomen: No acute abnormality. Musculoskeletal: No chest wall abnormality. No acute or significant osseous findings. Review of the MIP images confirms the above findings. IMPRESSION: No definite evidence of pulmonary embolus. Minimal right pleural effusion with minimal bibasilar subsegmental atelectasis. Electronically Signed   By: Marijo Conception M.D.   On: 06/18/2022 13:05   DG Chest 2 View  Result Date: 06/18/2022 CLINICAL DATA:  SOB EXAM: CHEST - 2 VIEW COMPARISON:  May 12, 2022  FINDINGS: The cardiomediastinal silhouette is unchanged in contour. Trace bilateral pleural effusions. No pneumothorax. Background of diffuse interstitial prominence, similar in comparison to prior. There is more confluent opacity in the RIGHT lateral mid lung and upper lung which are new in comparison to prior. Visualized abdomen is unremarkable. Multilevel degenerative changes of the thoracic spine. IMPRESSION: New confluent opacities in the RIGHT lateral mid lung and upper lung. Differential considerations include infection or superimposed atelectasis on a background of pulmonary edema versus chronic interstitial lung disease. Recommend follow-up PA and lateral chest radiograph 4 weeks after appropriate treatment to assess for resolution. Electronically Signed   By: Valentino Saxon M.D.   On: 06/18/2022 08:38     LOS: 0 days   Oren Binet, MD  [facility]    To contact the attending provider between 7A-7P or the covering provider during after hours 7P-7A, please log into the web site [email or web address] and access using universal  password for that web site. If you do not have the password, please call the hospital operator.  06/19/2022, 11:00 AM

## 2022-06-19 NOTE — Progress Notes (Signed)
OT Cancellation Note  Patient Details Name: Barbara Nelson MRN: [id number] DOB: December 28, 1940   Cancelled Treatment:    Reason Eval/Treat Not Completed: Other (comment).  With lab, OT to continue efforts.    Pasquale Matters D Stevee Valenta 06/19/2022, 5:02 PM 06/19/2022  RP, OTR/L  Acute Rehabilitation Services  Office:  [phone]

## 2022-06-20 ENCOUNTER — Inpatient Hospital Stay (HOSPITAL_COMMUNITY): Payer: Medicare Other

## 2022-06-20 DIAGNOSIS — R0602 Shortness of breath: Principal | ICD-10-CM

## 2022-06-20 DIAGNOSIS — R0600 Dyspnea, unspecified: Secondary | ICD-10-CM

## 2022-06-20 DIAGNOSIS — I10 Essential (primary) hypertension: Secondary | ICD-10-CM | POA: Diagnosis not present

## 2022-06-20 DIAGNOSIS — J9601 Acute respiratory failure with hypoxia: Secondary | ICD-10-CM | POA: Diagnosis not present

## 2022-06-20 DIAGNOSIS — R52 Pain, unspecified: Secondary | ICD-10-CM

## 2022-06-20 DIAGNOSIS — E871 Hypo-osmolality and hyponatremia: Secondary | ICD-10-CM | POA: Diagnosis not present

## 2022-06-20 DIAGNOSIS — J189 Pneumonia, unspecified organism: Secondary | ICD-10-CM | POA: Diagnosis not present

## 2022-06-20 LAB — COMPREHENSIVE METABOLIC PANEL
ALT: 36 U/L (ref 0–44)
AST: 56 U/L — ABNORMAL HIGH (ref 15–41)
Albumin: 1.7 g/dL — ABNORMAL LOW (ref 3.5–5.0)
Alkaline Phosphatase: 66 U/L (ref 38–126)
Anion gap: 10 (ref 5–15)
BUN: 8 mg/dL (ref 8–23)
CO2: 27 mmol/L (ref 22–32)
Calcium: 8.3 mg/dL — ABNORMAL LOW (ref 8.9–10.3)
Chloride: 93 mmol/L — ABNORMAL LOW (ref 98–111)
Creatinine, Ser: 0.77 mg/dL (ref 0.44–1.00)
GFR, Estimated: >60 mL/min (ref >60)
Glucose, Bld: 93 mg/dL (ref 70–99)
Potassium: 3.8 mmol/L (ref 3.5–5.1)
Sodium: 130 mmol/L — ABNORMAL LOW (ref 135–145)
Total Bilirubin: 0.9 mg/dL (ref 0.3–1.2)
Total Protein: 5.8 g/dL — ABNORMAL LOW (ref 6.5–8.1)

## 2022-06-20 LAB — MAGNESIUM: Magnesium: 1.8 mg/dL (ref 1.7–2.4)

## 2022-06-20 LAB — GLUCOSE, CAPILLARY
Glucose-Capillary: 104 mg/dL — ABNORMAL HIGH (ref 70–99)
Glucose-Capillary: 111 mg/dL — ABNORMAL HIGH (ref 70–99)
Glucose-Capillary: 131 mg/dL — ABNORMAL HIGH (ref 70–99)
Glucose-Capillary: 104 mg/dL — ABNORMAL HIGH (ref 70–99)

## 2022-06-20 LAB — PROTEIN / CREATININE RATIO, URINE
Creatinine, Urine: 49 mg/dL
Protein Creatinine Ratio: 1.69 mg/mg{Cre} — ABNORMAL HIGH (ref 0.00–0.15)
Total Protein, Urine: 83 mg/dL

## 2022-06-20 MED ORDER — IPRATROPIUM-ALBUTEROL 0.5-2.5 (3) MG/3ML IN SOLN
3.0000 mL | Freq: Three times a day (TID) | RESPIRATORY_TRACT | Status: DC
  Administered 2022-06-20: 3 mL via RESPIRATORY_TRACT
  Filled 2022-06-20: qty 3

## 2022-06-20 MED ORDER — ENSURE ENLIVE PO LIQD
237.0000 mL | Freq: Three times a day (TID) | ORAL | Status: DC
  Administered 2022-06-20 – 2022-06-26 (×18): 237 mL via ORAL

## 2022-06-20 MED ORDER — ALBUTEROL SULFATE (2.5 MG/3ML) 0.083% IN NEBU
2.5000 mg | INHALATION_SOLUTION | RESPIRATORY_TRACT | Status: DC | PRN
  Administered 2022-06-22: 2.5 mg via RESPIRATORY_TRACT
  Filled 2022-06-20: qty 3

## 2022-06-20 MED ORDER — POTASSIUM CHLORIDE CRYS ER 20 MEQ PO TBCR
40.0000 meq | EXTENDED_RELEASE_TABLET | Freq: Once | ORAL | Status: AC
  Administered 2022-06-20: 40 meq via ORAL
  Filled 2022-06-20: qty 2

## 2022-06-20 MED ORDER — ALBUMIN HUMAN 5 % IV SOLN
12.5000 g | Freq: Once | INTRAVENOUS | Status: AC
  Administered 2022-06-20: 12.5 g via INTRAVENOUS
  Filled 2022-06-20 (×2): qty 250

## 2022-06-20 MED ORDER — DICLOFENAC SODIUM 1 % EX GEL
2.0000 g | Freq: Four times a day (QID) | CUTANEOUS | Status: DC
  Administered 2022-06-20 – 2022-06-26 (×21): 2 g via TOPICAL
  Filled 2022-06-20: qty 100

## 2022-06-20 MED ORDER — METOPROLOL SUCCINATE ER 25 MG PO TB24
12.5000 mg | ORAL_TABLET | Freq: Every day | ORAL | Status: DC
  Administered 2022-06-21: 12.5 mg via ORAL
  Filled 2022-06-20: qty 1

## 2022-06-20 MED ORDER — LEVOFLOXACIN 750 MG PO TABS
750.0000 mg | ORAL_TABLET | Freq: Every day | ORAL | Status: AC
  Administered 2022-06-20 – 2022-06-24 (×5): 750 mg via ORAL
  Filled 2022-06-20 (×5): qty 1

## 2022-06-20 MED ORDER — FUROSEMIDE 10 MG/ML IJ SOLN
40.0000 mg | Freq: Once | INTRAMUSCULAR | Status: AC
  Administered 2022-06-20: 40 mg via INTRAVENOUS
  Filled 2022-06-20: qty 4

## 2022-06-20 NOTE — Evaluation (Signed)
Occupational Therapy Evaluation Patient Details Name: Barbara Nelson MRN: [id number] DOB: 1941-01-22 Today's Date: 06/20/2022   History of Present Illness 82 y.o. female presents to [facility] hospital on 06/18/2022 with productive cough and SOB. Pt found to be hyponatremic. PMH includes HTN, PAF, mycetoma s/p LML lobectomy of lung, Pericardectomy for tamponade from viral pericarditis.   Clinical Impression   PTA, pt lived with her daughter who assisted with ADL and provided supervision assist for functional mobility with standard walker. Upon eval, pt requires mod A for STS transfers and min guard-min A for pivot to chair with RW. Pt with slow processing throughout. Pt required mod multimodal cues for sequencing and technique during bed mobility and transfers this session. Recommending SNF for continued OT services to optimize safety and independence in ADL and IADL. Daughter willing to learn to assist with transfers if family decides to take pt home; if family decides home recommend [facility].      Recommendations for follow up therapy are one component of a multi-disciplinary discharge planning process, led by the attending physician.  Recommendations may be updated based on patient status, additional functional criteria and insurance authorization.   Follow Up Recommendations  Skilled nursing-short term rehab (<3 hours/day)     Assistance Recommended at Discharge Frequent or constant Supervision/Assistance  Patient can return home with the following A lot of help with walking and/or transfers;A lot of help with bathing/dressing/bathroom;Assistance with cooking/housework;Help with stairs or ramp for entrance;Assist for transportation;Direct supervision/assist for medications management;Direct supervision/assist for financial management    Functional Status Assessment  Patient has had a recent decline in their functional status and demonstrates the ability to make significant improvements in function in  a reasonable and predictable amount of time.  Equipment Recommendations  Other (comment) (defer)    Recommendations for Other Services       Precautions / Restrictions Precautions Precautions: Fall Restrictions Weight Bearing Restrictions: No      Mobility Bed Mobility Overal bed mobility: Needs Assistance Bed Mobility: Supine to Sit     Supine to sit: Min assist, HOB elevated     General bed mobility comments: step by step cues for sequencing and assist with RLE as well as truncal elevation    Transfers Overall transfer level: Needs assistance Equipment used: Rolling walker (2 wheels) Transfers: Sit to/from Stand, Bed to chair/wheelchair/BSC Sit to Stand: Mod assist     Step pivot transfers: Min assist     General transfer comment: Mod multimodal cues to shift weight anteriorly and light mod A to rise. Min A to pivot to chair. Observed to attempt to premature sit      Balance Overall balance assessment: Needs assistance Sitting-balance support: No upper extremity supported, Feet supported Sitting balance-Leahy Scale: Fair     Standing balance support: Bilateral upper extremity supported, Reliant on assistive device for balance Standing balance-Leahy Scale: Poor Standing balance comment: minG - min A with fatigue                           ADL either performed or assessed with clinical judgement   ADL Overall ADL's : Needs assistance/impaired Eating/Feeding: Modified independent;Bed level   Grooming: Set up;Sitting   Upper Body Bathing: Set up;Sitting   Lower Body Bathing: Moderate assistance;Sitting/lateral leans   Upper Body Dressing : Set up;Sitting   Lower Body Dressing: Maximal assistance;Sit to/from stand   Toilet Transfer: Moderate assistance;Stand-pivot;Rolling walker (2 wheels);BSC/3in1 Toilet Transfer Details (indicate cue type and  reason): simulated to recliner. Mod A to rise; min guard A for pivot         Functional mobility  during ADLs: Min guard;Rolling walker (2 wheels) General ADL Comments: min guard a and verbal cues for sequencing during functional mobility. Limited by respiratory status and generalized weakness     Vision Ability to See in Adequate Light: 0 Adequate Patient Visual Report: No change from baseline Vision Assessment?: No apparent visual deficits     Perception     Praxis      Pertinent Vitals/Pain Pain Assessment Pain Assessment: Faces Faces Pain Scale: Hurts little more Pain Location: back Pain Descriptors / Indicators: Grimacing Pain Intervention(s): Limited activity within patient's tolerance, Monitored during session     Hand Dominance Right   Extremity/Trunk Assessment Upper Extremity Assessment Upper Extremity Assessment: Generalized weakness;RUE deficits/detail RUE Deficits / Details: bUE generally weak, but 4-/5 as compared to L 4/5   Lower Extremity Assessment Lower Extremity Assessment: Generalized weakness   Cervical / Trunk Assessment Cervical / Trunk Assessment: Kyphotic   Communication Communication Communication: No difficulties   Cognition Arousal/Alertness: Awake/alert Behavior During Therapy: Flat affect Overall Cognitive Status: Impaired/Different from baseline Area of Impairment: Problem solving, Safety/judgement, Following commands                       Following Commands: Follows one step commands consistently, Follows one step commands with increased time Safety/Judgement: Decreased awareness of safety   Problem Solving: Slow processing, Requires verbal cues, Difficulty sequencing General Comments: slow processing, but following all commands with increased time. Required step by step cues during transfers and bed mobility     General Comments  on 3L [place] at rest when OT entered room. Desatting as low as 85% during functional mobility    Exercises     Shoulder Instructions      Home Living Family/patient expects to be discharged  to:: Private residence Living Arrangements: Children Available Help at Discharge: Family;Available 24 hours/day Type of Home: House Home Access: Stairs to enter [employer] of Steps: 1 Entrance Stairs-Rails: Can reach both Home Layout: Multi-level;Able to live on main level with bedroom/bathroom               Home Equipment: Toilet riser;Grab bars - toilet;Grab bars - tub/shower;Tub bench;Standard Walker          Prior Functioning/Environment Prior Level of Function : Needs assist             Mobility Comments: pt ambulates for household distances with use of standard walker and supervision ADLs Comments: pt requires assistance for bathing, dressing, IADLs. Pt does perform portions of bathing and dressing.        OT Problem List: Decreased strength;Decreased activity tolerance;Impaired balance (sitting and/or standing);Decreased safety awareness;Decreased knowledge of use of DME or AE;Cardiopulmonary status limiting activity;Pain      OT Treatment/Interventions: Self-care/ADL training;Therapeutic exercise;DME and/or AE instruction;Balance training;Patient/family education;Cognitive remediation/compensation;Therapeutic activities    OT Goals(Current goals can be found in the care plan section) Acute Rehab OT Goals Patient Stated Goal: get better OT Goal Formulation: With patient Time For Goal Achievement: 07/04/22 Potential to Achieve Goals: Good  OT Frequency: Min 2X/week    Co-evaluation              AM-PAC OT "6 Clicks" Daily Activity     Outcome Measure Help from another person eating meals?: None Help from another person taking care of personal grooming?: A Little Help from another person  toileting, which includes using toliet, bedpan, or urinal?: A Lot Help from another person bathing (including washing, rinsing, drying)?: A Lot Help from another person to put on and taking off regular upper body clothing?: A Little Help from another person  to put on and taking off regular lower body clothing?: A Lot 6 Click Score: 16   End of Session Equipment Utilized During Treatment: Gait belt;Rolling walker (2 wheels) Nurse Communication: Mobility status  Activity Tolerance: Patient tolerated treatment well Patient left: in chair;with call bell/phone within reach;with family/visitor present;Other (comment) (SLP and MD in room)  OT Visit Diagnosis: Unsteadiness on feet (R26.81);Muscle weakness (generalized) (M62.81);Other abnormalities of gait and mobility (R26.89);Other symptoms and signs involving cognitive function                Time: 1115-1150 OT Time Calculation (min): 35 min Charges:  OT General Charges $OT Visit: 1 Visit OT Evaluation $OT Eval Moderate Complexity: 1 Mod OT Treatments $Self Care/Home Management : 8-22 mins  Elder Cyphers, OTR/L [facility] Acute Rehabilitation Office: [phone]   Magnus Ivan 06/20/2022, 1:27 PM

## 2022-06-20 NOTE — Progress Notes (Signed)
Pharmacy Antibiotic Note  Barbara Nelson is a 82 y.o. female admitted on 06/18/2022 with pneumonia currently on doxycycline.  Pharmacy has been consulted for levofloxacin dosing. SCr 0.77 and at baseline. CrCl 63 ml/min. WBC 7.7 and afebrile. Antibiotics escalated for increasing oxygen needs.   Plan: Start Levofloxacin 750 mg PO daily x5 days Stop doxycycline Monitor for signs/symptoms of infection  Height: 5\' 5"  (165.1 cm) Weight: 98.4 kg (217 lb) IBW/kg (Calculated) : 57  Temp (24hrs), Avg:97.9 F (36.6 C), Min:97.5 F (36.4 C), Max:98.3 F (36.8 C)  Recent Labs  Lab 06/18/22 0803 06/19/22 0657 06/20/22 0315  WBC 6.6 7.7  --   CREATININE 0.71 0.70 0.77    Estimated Creatinine Clearance: 63 mL/min (by C-G formula based on SCr of 0.77 mg/dL).    Allergies  Allergen Reactions   Latex Rash   Penicillins Rash and Itching   Tape Hives and Rash   Z-Pak [Azithromycin] Rash and Other (See Comments)    Mouth sores   Eggs Or Egg-Derived Products Nausea And Vomiting   Amoxil [Amoxicillin] Rash   Sulfa Antibiotics Rash    Antimicrobials this admission: Doxycycline 2/2  Levofloxacin 2/3 >> 2/7  Microbiology results: 2/2 BCx: pending  Thank you for allowing pharmacy to be a part of this patient's care.  Jeneen Rinks 0/0/5259 1:02 AM

## 2022-06-20 NOTE — Progress Notes (Signed)
VASCULAR LAB    Left lower extremity venous duplex has been performed.  See CV proc for preliminary results.   Carigan Lister, RVT 06/20/2022, 3:28 PM

## 2022-06-20 NOTE — Consult Note (Signed)
NAME:  Barbara Nelson, MRN:  [id number], DOB:  29-Jan-1941, LOS: 1 ADMISSION DATE:  06/18/2022, CONSULTATION DATE:  06/20/22  REFERRING MD:  Oren Binet  CHIEF COMPLAINT:  SOB   History of Present Illness:  Patient is a 82 year old female with hx of HTN, cardiac Tamponade due to viral pericarditis, LBBB, pulmonary blastomycosis with right lower lobectomy 2014, and pulmonary nodule presented to [facility] ED presented with shortness of breath over the 2 to 3 days prior on 06/18/22. Patient diagnosed with mild to acute hypoxic respiratory failure requiring 2 liters of O2 via [place] due to possible acute CHF exacerbation and admitted to [facility] service for further workup. Patient continues to have hypoxia despite current interventions and PCCM consulted for pulmonary recommendations. Patient is alert and oriented x 4 with daughter and grandson at bedside. Patient noticed was becoming short of breath 2-3 days prior to admission along with a low grade fever and clear sputum. Patient does not have any chest pain, orthopnea, PND, or any recent weight gain but weight loss. Patient has had a recent decline in appetite which family confirms. Patient denies any impairment with memory but daughter endorses that patient has a had some memory decline since fall December of 2023 along with shingles outbreak, which she is taking pregablin for post-herpetic neuralgia. Patient denies any recent sick contacts, significant fevers, cough, pleuritic pain, hemoptysis, or any abnormal signs of bleeding.   Pertinent  Medical History   Past Medical History:  Diagnosis Date   Benign essential HTN 10/31/2015   Cardiac tamponade due to viral pericarditis    LBBB (left bundle branch block)    Lung mass    fungal infection s/p resection at [facility]   Postoperative atrial fibrillation [facility])    Pulmonary nodule      Significant Hospital Events: Including procedures, antibiotic start and stop dates in addition to other pertinent events   2/1  Admitted for acute hypoxic respiratory failure requiring oxygen  2/3 PCCM consult for ongoing respiratory failure   Interim History / Subjective:  Patient alert and oriented x 4, edematous   Objective   Blood pressure 99/69, pulse 88, temperature 98.3 F (36.8 C), temperature source Oral, resp. rate 20, height 5\' 5"  (1.651 m), weight 98.4 kg, SpO2 93 %.        Intake/Output Summary (Last 24 hours) at 06/20/2022 1235 Last data filed at 06/20/2022 1200 Gross per 24 hour  Intake --  Output 1650 ml  Net -1650 ml   Filed Weights   06/18/22 0738  Weight: 98.4 kg    Examination: General: chronic ill appearing older female, decondition lying on medical floor bed HENT: MM pink and moist, teeth intact, trachea midline  Lungs: Breath sounds clear and diminished in all lung fields bilaterally, tachypnea Cardiovascular: s1 and s2 heard upon auscultation, RRR, ST, no m/r/g  Abdomen: soft, slightly distended, BS active in all quadrants  Extremities: Able to move all extremities on command, generalized edema 1-2 +  Neuro: Alert and oriented x 4, able follow commands  GU: Deferred   Resolved Hospital Problem list   N/A  Assessment & Plan:  Acute Hypoxic Respiratory failure  -Patient currently on 4LNC with tachypnea, unable to wean bc of hypoxia and SOB. Chest x-ray shows pulmonary vascular congestion/edema -CTA negative for PE.  Acute on chronic combined systolic and diastolic congestive heart failure with acute exacerbation  -ECHO EF 50-55%, left ventricle with low function, no regional wall abnormalities, mild left ventricular hypertrophy diastolic  parameters are consistent with Grade I diastolic dysfunction. Right ventricle function normal.There is a mass present on mitral annulus.     Suspect mild heart failure exacerbation worsening bc of hypoalbuminemia Hypoalbuminemia  -Albumin decreased at 1.7 and Total protein at 5.8, patient not eating sufficiently-more than likely contributing to.   P: Give Albumin 5% 12.5 g  Give Ensure to increase protein  Give Additional dose of 40 mg of lasix Will supplement K of 3.8 with oral potassium  Monitor strict I/O  Restrict fluid oral intact/low sodium diet  Use IS, flutter valve at beside  Encourage mobilization, walking and up to chair q 2hrs-4hrs     Best Practice (right click and "Reselect all SmartList Selections" daily)   Diet/type: Regular consistency (see orders) heart healthy with fluid restriction  DVT prophylaxis: SCD GI prophylaxis: N/A Lines: N/A Foley:  N/A Code Status:  full code Last date of multidisciplinary goals of care discussion [Patient and family updated at bedside  Labs   CBC: Recent Labs  Lab 06/18/22 0803 06/19/22 0657  WBC 6.6 7.7  NEUTROABS 4.4  --   HGB 13.0 12.4  HCT 38.3 36.2  MCV 82.2 82.8  PLT 171 277    Basic Metabolic Panel: Recent Labs  Lab 06/18/22 0803 06/19/22 0657 06/20/22 0315  NA 127* 131* 130*  K 3.3* 3.6 3.8  CL 91* 95* 93*  CO2 28 28 27   GLUCOSE 133* 109* 93  BUN 8 7* 8  CREATININE 0.71 0.70 0.77  CALCIUM 8.1* 8.3* 8.3*  MG 1.8 1.9 1.8   GFR: Estimated Creatinine Clearance: 63 mL/min (by C-G formula based on SCr of 0.77 mg/dL). Recent Labs  Lab 06/18/22 0803 06/18/22 2013 06/19/22 0657  PROCALCITON  --  <0.10  --   WBC 6.6  --  7.7    Liver Function Tests: Recent Labs  Lab 06/19/22 0657 06/20/22 0315  AST 58* 56*  ALT 34 36  ALKPHOS 76 66  BILITOT 0.9 0.9  PROT 5.7* 5.8*  ALBUMIN 1.7* 1.7*   No results for input(s): "LIPASE", "AMYLASE" in the last 168 hours. No results for input(s): "AMMONIA" in the last 168 hours.  ABG No results found for: "PHART", "PCO2ART", "PO2ART", "HCO3", "TCO2", "ACIDBASEDEF", "O2SAT"   Coagulation Profile: No results for input(s): "INR", "PROTIME" in the last 168 hours.  Cardiac Enzymes: No results for input(s): "CKTOTAL", "CKMB", "CKMBINDEX", "TROPONINI" in the last 168 hours.  HbA1C: Hgb A1c MFr Bld   Date/Time Value Ref Range Status  06/19/2022 06:57 AM 6.9 (H) 4.8 - 5.6 % Final    Comment:    (NOTE) Pre diabetes:          5.7%-6.4%  Diabetes:              >6.4%  Glycemic control for   <7.0% adults with diabetes     CBG: Recent Labs  Lab 06/19/22 1215 06/19/22 1621 06/19/22 2135 06/20/22 0815 06/20/22 1213  GLUCAP 111* 104* 119* 104* 111*    Review of Systems:   Please see the history of present illness. All other systems reviewed and are negative .   Past Medical History:  She,  has a past medical history of Benign essential HTN (10/31/2015), Cardiac tamponade due to viral pericarditis, LBBB (left bundle branch block), Lung mass, Postoperative atrial fibrillation ([place]), and Pulmonary nodule.   Surgical History:   Past Surgical History:  Procedure Laterality Date   ABDOMINAL HYSTERECTOMY  1988   chest tube placement  09/03/11  LUNG REMOVAL, PARTIAL     right knee arthoscopy with meniscal repair     subxiphoid pericardial window and drainage anterior partial pericardiectomy  09/03/11     Social History:   reports that she quit smoking about 43 years ago. Her smoking use included cigarettes. She has a 15.00 pack-year smoking history. She has never used smokeless tobacco. She reports that she does not drink alcohol and does not use drugs.   Family History:  Her family history includes Hypertension in her father, mother, and sister; Leukemia in her maternal aunt; Ovarian cancer in her mother; Prostate cancer in her brother; Stroke in her father; Thyroid disease in her sister.   Allergies Allergies  Allergen Reactions   Latex Rash   Penicillins Rash and Itching   Tape Hives and Rash   Z-Pak [Azithromycin] Rash and Other (See Comments)    Mouth sores   Eggs Or Egg-Derived Products Nausea And Vomiting   Amoxil [Amoxicillin] Rash   Sulfa Antibiotics Rash     Home Medications  Prior to Admission medications   Medication Sig Start Date End Date Taking?  Authorizing Provider  acetaminophen (TYLENOL) 325 MG tablet Take 650 mg by mouth daily as needed for mild pain, moderate pain, fever or headache.   Yes [provider]  metoprolol succinate (TOPROL-XL) 25 MG 24 hr tablet TAKE 1 TABLET (25 MG TOTAL) BY MOUTH DAILY. 06/16/22  Yes Turner, Eber Hong, MD  pregabalin (LYRICA) 25 MG capsule Take 25 mg by mouth at bedtime.   Yes [provider]  Propylene Glycol (SYSTANE COMPLETE) 0.6 % SOLN Place 1 drop into both eyes 3 (three) times daily.   Yes [provider]  metFORMIN (GLUCOPHAGE-XR) 500 MG 24 hr tablet Take 500 mg by mouth daily with supper. Patient not taking: Reported on 06/18/2022    [provider]     Critical care time: NA  Laurence Slate BSN, RN, S-ACNP

## 2022-06-20 NOTE — Evaluation (Signed)
Clinical/Bedside Swallow Evaluation Patient Details  Name: Barbara Nelson MRN: [id number] Date of Birth: [birth year]  Today's Date: 06/20/2022 Time: SLP Start Time (ACUTE ONLY): 5027 SLP Stop Time (ACUTE ONLY): 1210 SLP Time Calculation (min) (ACUTE ONLY): 18 min  Past Medical History:  Past Medical History:  Diagnosis Date   Benign essential HTN 10/31/2015   Cardiac tamponade due to viral pericarditis    LBBB (left bundle branch block)    Lung mass    fungal infection s/p resection at [facility]   Postoperative atrial fibrillation [facility])    Pulmonary nodule    Past Surgical History:  Past Surgical History:  Procedure Laterality Date   ABDOMINAL HYSTERECTOMY  1988   chest tube placement  09/03/11   LUNG REMOVAL, PARTIAL     right knee arthoscopy with meniscal repair     subxiphoid pericardial window and drainage anterior partial pericardiectomy  09/03/11   HPI:  Barbara Nelson is a 82 y.o. female with medical history significant of HTN, post-op PAF, mycetoma s/p LML lobectomy of lung, Pericardectomy for tamponade from viral pericarditis.     Pt in to ED at [facility] with c/o several day h/o cough productive of grey sputum and progressively worsening SOB. CXR on 06/20/22 indicated Unchanged mild CHF pattern with bibasilar atelectasis; BSE generated    Assessment / Plan / Recommendation  Clinical Impression  Pt seen for clinical swallowing evaluation with concerns for coughing during meals noted per chart review/nursing report.  Pt consumed thin via small sips, puree/cracker by small bites and ice chips during evaluation with verbal cues to consume all POs required.  Pt utilizing smaller amounts/volume to conserve energy paired with balancing breathing/swallowing reciprocity during meals with SLP providing cues.  Pt has oxygen requirement now which is not typical for her, so xerostomia may impact swallowing with oral mucosa requiring hydration prior to intake of POs.  Pt/daughter educated with  swallowing precautions and both agreed and were appreciative.  Recommend continuing regular/thin liquid diet with swallowing precautions posted in room and ST f/u x1 for diet tolerance in acute setting.  Thank you for this consult. SLP Visit Diagnosis: Dysphagia, unspecified (R13.10)    Aspiration Risk  Mild aspiration risk    Diet Recommendation   Regular/thin liquids  Medication Administration: Whole meds with liquid    Other  Recommendations Oral Care Recommendations: Oral care BID;Patient independent with oral care    Recommendations for follow up therapy are one component of a multi-disciplinary discharge planning process, led by the attending physician.  Recommendations may be updated based on patient status, additional functional criteria and insurance authorization.  Follow up Recommendations No SLP follow up      Assistance Recommended at Discharge  TBD  Functional Status Assessment Patient has had a recent decline in their functional status and demonstrates the ability to make significant improvements in function in a reasonable and predictable amount of time.  Frequency and Duration min 1 x/week  1 week       Prognosis Prognosis for Safe Diet Advancement: Good      Swallow Study   General Date of Onset: 06/18/22 HPI: Barbara Nelson is a 82 y.o. female with medical history significant of HTN, post-op PAF, mycetoma s/p LML lobectomy of lung, Pericardectomy for tamponade from viral pericarditis.     Pt in to ED at [facility] with c/o several day h/o cough productive of grey sputum and progressively worsening SOB. CXR on 06/20/22 indicated Unchanged mild CHF pattern with bibasilar atelectasis;  BSE generated Type of Study: Bedside Swallow Evaluation Previous Swallow Assessment: n/a Diet Prior to this Study: Regular;Thin liquids Temperature Spikes Noted: No Respiratory Status: Nasal cannula History of Recent Intubation: No Behavior/Cognition: Alert;Cooperative Oral Cavity  Assessment: Within Functional Limits Oral Care Completed by SLP: Recent completion by staff Oral Cavity - Dentition: Adequate natural dentition;Missing dentition Vision: Functional for self-feeding Self-Feeding Abilities: Able to feed self;Needs assist Patient Positioning: Upright in chair Baseline Vocal Quality: Low vocal intensity Volitional Cough: Strong Volitional Swallow: Able to elicit    Oral/Motor/Sensory Function Overall Oral Motor/Sensory Function: Generalized oral weakness   Ice Chips Ice chips: Within functional limits Presentation: Spoon   Thin Liquid Thin Liquid: Within functional limits Presentation: Straw;Cup;Self Fed Other Comments: with small sips only    Nectar Thick Nectar Thick Liquid: Not tested   Honey Thick Honey Thick Liquid: Not tested   Puree Puree: Within functional limits Presentation: Self Fed Other Comments: small 1/2 tsp amounts   Solid     Solid: Within functional limits Presentation: Self Fed Other Comments: small bites to conserve energy      Pat Jennetta Flood,M.S., CCC-SLP 06/20/2022,12:28 PM

## 2022-06-20 NOTE — Progress Notes (Signed)
PROGRESS NOTE        PATIENT DETAILS Name: Barbara Nelson Age: 82 y.o. Sex: female Date of Birth: 08/30/1940 Admit Date: 06/18/2022 Admitting Physician Karmen Bongo, MD [id number], Abigail Butts, MD  Brief Summary: Patient is a 82 y.o.  female with history of HTN, prior pericardiectomy for tamponade from viral pericarditis, prior history of pulmonary blastomycosis-s/p right lower lobectomy 2014--presented with 2-3-day history of exertional dyspnea-found to have mild acute hypoxic respiratory failure requiring around 2 L of oxygen to maintain O2 saturation above 88%.  Significant events: 2/1>> admit to [facility]-hypoxia-?suspected CHF exacerbation.  Admit to [facility].  Significant studies: 2/1>> CT angio chest: No PE, minimal right pleural effusion, bibasilar subsegmental atelectasis 2/2>> echo: EF 50-55%, mass along the mitral annulus.  Grade 1 diastolic dysfunction  Significant microbiology data: 2/2>> COVID/influenza/RSV PCR: Negative 2/2>> respiratory virus panel: Negative  Procedures: None  Consults: None  Subjective: Up to 4 L of oxygen this morning Coughing continues.  Objective: Vitals: Blood pressure 114/80, pulse 90, temperature 98.3 F (36.8 C), temperature source Oral, resp. rate 18, height 5\' 5"  (1.651 m), weight 98.4 kg, SpO2 96 %.   Exam: Gen Exam:Alert awake-not in any distress HEENT:atraumatic, normocephalic Chest: Few bibasilar rales. CVS:S1S2 regular Abdomen:soft non tender, non distended Extremities:++ edema Neurology: Non focal Skin: no rash  Pertinent Labs/Radiology:    Latest Ref Rng & Units 06/19/2022    6:57 AM 06/18/2022    8:03 AM 05/13/2022   12:13 AM  CBC  WBC 4.0 - 10.5 K/uL 7.7  6.6  6.0   Hemoglobin 12.0 - 15.0 g/dL 12.4  13.0  14.1   Hematocrit 36.0 - 46.0 % 36.2  38.3  42.1   Platelets 150 - 400 K/uL 155  171  128     Lab Results  Component Value Date   NA 130 (L) 06/20/2022   K 3.8 06/20/2022   CL 93 (L)  06/20/2022   CO2 27 06/20/2022     Assessment/Plan: Acute hypoxic respiratory failure Unclear etiology Suspicion for HFpEF exacerbation as she had some leg edema-however not significantly improved after 2 days of IV Lasix.  Leg edema has essentially resolved at this point.   Had low-grade fever on 2/2-coughing-empirically started on antibiotics. Even with above measures-continues to be hypoxic-up to 4 L of oxygen today. Plan would be to continue diuretics/antibiotics-supportive care and follow closely. I will discuss with pulmonology to see if they have any further recommendations  Hyponatremia Multifactorial-due to HCTZ/volume overload from perhaps underlying CHF Continue IV Lasix-sodium levels improving Fluid restriction  HFpEF exacerbation See above  CAP See above Will change from Doxy to levofloxacin  Mass along the mitral valve annulus Seen incidentally on echo Will need to discuss with cardiology.  Hypoalbuminemia Significant drop in albumin levels Unclear if this is from acute illness or from proteinuria (history of DM) Check urine protein creatinine ratio  History of thoracoscopy with right lung lobectomy 2014 History of pulmonary blastomycosis  History of pericardial effusion-requiring pericardiectomy  HTN BP soft Decrease metoprolol to allow more room for diuresis  DM-2 (A1c 6.9 on 2/2) Metformin on hold Monitor CBGs on SSI  Recent Labs    06/19/22 1621 06/19/22 2135 06/20/22 0815  GLUCAP 104* 119* 104*    Obesity: Estimated body mass index is 36.11 kg/m as calculated from the following:   Height as of this encounter:  5\' 5"  (1.651 m).   Weight as of this encounter: 98.4 kg.   Code status:   Code Status: Full Code   DVT Prophylaxis: enoxaparin (LOVENOX) injection 40 mg Start: 06/19/22 1000   Family Communication: Daughter at bedside   Disposition Plan: Status is: Observation The patient will require care spanning > 2 midnights and  should be moved to inpatient because: Severity of illness   Planned Discharge Destination:Home   Diet: Diet Order             Diet heart healthy/carb modified Room service appropriate? Yes; Fluid consistency: Thin; Fluid restriction: 1500 mL Fluid  Diet effective now                     Antimicrobial agents: Anti-infectives (From admission, onward)    Start     Dose/Rate Route Frequency Ordered Stop   06/20/22 1030  levofloxacin (LEVAQUIN) tablet 750 mg        750 mg Oral Daily 06/20/22 0932 06/25/22 0959   06/19/22 1200  doxycycline (VIBRA-TABS) tablet 100 mg  Status:  Discontinued        100 mg Oral Every 12 hours 06/19/22 1110 06/20/22 0917        MEDICATIONS: Scheduled Meds:  enoxaparin (LOVENOX) injection  40 mg Subcutaneous Q24H   furosemide  40 mg Intravenous Daily   insulin aspart  0-9 Units Subcutaneous TID WC   ipratropium-albuterol  3 mL Nebulization TID   levofloxacin  750 mg Oral Daily   metoprolol succinate  25 mg Oral Daily   polyvinyl alcohol  1 drop Both Eyes TID   pregabalin  25 mg Oral QHS   Continuous Infusions: PRN Meds:.acetaminophen **OR** acetaminophen, albuterol, ondansetron **OR** ondansetron (ZOFRAN) IV   I have personally reviewed following labs and imaging studies  LABORATORY DATA: CBC: Recent Labs  Lab 06/18/22 0803 06/19/22 0657  WBC 6.6 7.7  NEUTROABS 4.4  --   HGB 13.0 12.4  HCT 38.3 36.2  MCV 82.2 82.8  PLT 171 155     Basic Metabolic Panel: Recent Labs  Lab 06/18/22 0803 06/19/22 0657 06/20/22 0315  NA 127* 131* 130*  K 3.3* 3.6 3.8  CL 91* 95* 93*  CO2 28 28 27   GLUCOSE 133* 109* 93  BUN 8 7* 8  CREATININE 0.71 0.70 0.77  CALCIUM 8.1* 8.3* 8.3*  MG 1.8 1.9 1.8     GFR: Estimated Creatinine Clearance: 63 mL/min (by C-G formula based on SCr of 0.77 mg/dL).  Liver Function Tests: Recent Labs  Lab 06/19/22 0657 06/20/22 0315  AST 58* 56*  ALT 34 36  ALKPHOS 76 66  BILITOT 0.9 0.9  PROT 5.7*  5.8*  ALBUMIN 1.7* 1.7*    No results for input(s): "LIPASE", "AMYLASE" in the last 168 hours. No results for input(s): "AMMONIA" in the last 168 hours.  Coagulation Profile: No results for input(s): "INR", "PROTIME" in the last 168 hours.  Cardiac Enzymes: No results for input(s): "CKTOTAL", "CKMB", "CKMBINDEX", "TROPONINI" in the last 168 hours.  BNP (last 3 results) No results for input(s): "PROBNP" in the last 8760 hours.  Lipid Profile: No results for input(s): "CHOL", "HDL", "LDLCALC", "TRIG", "CHOLHDL", "LDLDIRECT" in the last 72 hours.  Thyroid Function Tests: No results for input(s): "TSH", "T4TOTAL", "FREET4", "T3FREE", "THYROIDAB" in the last 72 hours.  Anemia Panel: No results for input(s): "VITAMINB12", "FOLATE", "FERRITIN", "TIBC", "IRON", "RETICCTPCT" in the last 72 hours.  Urine analysis:    Component Value Date/Time  COLORURINE YELLOW 06/19/2022 1456   APPEARANCEUR HAZY (A) 06/19/2022 1456   LABSPEC 1.012 06/19/2022 1456   PHURINE 7.0 06/19/2022 1456   GLUCOSEU NEGATIVE 06/19/2022 1456   HGBUR NEGATIVE 06/19/2022 1456   [place] 06/19/2022 1456   KETONESUR NEGATIVE 06/19/2022 1456   PROTEINUR 100 (A) 06/19/2022 1456   UROBILINOGEN 0.2 09/15/2011 0530   NITRITE NEGATIVE 06/19/2022 1456   LEUKOCYTESUR NEGATIVE 06/19/2022 1456    Sepsis Labs: Lactic Acid, Venous No results found for: "LATICACIDVEN"  MICROBIOLOGY: Recent Results (from the past 240 hour(s))  Resp panel by RT-PCR (RSV, Flu A&B, Covid) Anterior Nasal Swab     Status: None   Collection Time: 06/19/22 12:25 AM   Specimen: Anterior Nasal Swab  Result Value Ref Range Status   SARS Coronavirus 2 by RT PCR NEGATIVE NEGATIVE Final   Influenza A by PCR NEGATIVE NEGATIVE Final   Influenza B by PCR NEGATIVE NEGATIVE Final    Comment: (NOTE) The Xpert Xpress SARS-CoV-2/FLU/RSV plus assay is intended as an aid in the diagnosis of influenza from Nasopharyngeal swab specimens  and should not be used as a sole basis for treatment. Nasal washings and aspirates are unacceptable for Xpert Xpress SARS-CoV-2/FLU/RSV testing.  Fact Sheet for Patients: EntrepreneurPulse.com.au  Fact Sheet for Healthcare Providers: IncredibleEmployment.be  This test is not yet approved or cleared by the [place] FDA and has been authorized for detection and/or diagnosis of SARS-CoV-2 by FDA under an Emergency Use Authorization (EUA). This EUA will remain in effect (meaning this test can be used) for the duration of the COVID-19 declaration under Section 564(b)(1) of the Act, 21 U.S.C. section 360bbb-3(b)(1), unless the authorization is terminated or revoked.     Resp Syncytial Virus by PCR NEGATIVE NEGATIVE Final    Comment: (NOTE) Fact Sheet for Patients: EntrepreneurPulse.com.au  Fact Sheet for Healthcare Providers: IncredibleEmployment.be  This test is not yet approved or cleared by the [place] FDA and has been authorized for detection and/or diagnosis of SARS-CoV-2 by FDA under an Emergency Use Authorization (EUA). This EUA will remain in effect (meaning this test can be used) for the duration of the COVID-19 declaration under Section 564(b)(1) of the Act, 21 U.S.C. section 360bbb-3(b)(1), unless the authorization is terminated or revoked.  Performed at [facility], [place] [address] [place], [place] [address]   Respiratory (~20 pathogens) panel by PCR     Status: None   Collection Time: 06/19/22 12:25 AM   Specimen: Nasopharyngeal Swab; Respiratory  Result Value Ref Range Status   Adenovirus NOT DETECTED NOT DETECTED Final   Coronavirus 229E NOT DETECTED NOT DETECTED Final    Comment: (NOTE) The Coronavirus on the Respiratory Panel, DOES NOT test for the novel  Coronavirus (2019 nCoV)    Coronavirus HKU1 NOT DETECTED NOT DETECTED Final   Coronavirus NL63 NOT DETECTED NOT  DETECTED Final   Coronavirus OC43 NOT DETECTED NOT DETECTED Final   Metapneumovirus NOT DETECTED NOT DETECTED Final   Rhinovirus / Enterovirus NOT DETECTED NOT DETECTED Final   Influenza A NOT DETECTED NOT DETECTED Final   Influenza B NOT DETECTED NOT DETECTED Final   Parainfluenza Virus 1 NOT DETECTED NOT DETECTED Final   Parainfluenza Virus 2 NOT DETECTED NOT DETECTED Final   Parainfluenza Virus 3 NOT DETECTED NOT DETECTED Final   Parainfluenza Virus 4 NOT DETECTED NOT DETECTED Final   Respiratory Syncytial Virus NOT DETECTED NOT DETECTED Final   Bordetella pertussis NOT DETECTED NOT DETECTED Final   Bordetella Parapertussis NOT  DETECTED NOT DETECTED Final   Chlamydophila pneumoniae NOT DETECTED NOT DETECTED Final   Mycoplasma pneumoniae NOT DETECTED NOT DETECTED Final    Comment: Performed at [facility], [place] [address] [place], [place] [address]  Culture, blood (Routine X 2) w Reflex to ID Panel     Status: None (Preliminary result)   Collection Time: 06/19/22 11:27 AM   Specimen: BLOOD LEFT HAND  Result Value Ref Range Status   Specimen Description BLOOD LEFT HAND  Final   Special Requests   Final    BOTTLES DRAWN AEROBIC AND ANAEROBIC Blood Culture adequate volume   Culture   Final    NO GROWTH < 24 HOURS Performed at [facility], [place] [address] [place], [place] [address]    Report Status PENDING  Incomplete  Culture, blood (Routine X 2) w Reflex to ID Panel     Status: None (Preliminary result)   Collection Time: 06/19/22 11:27 AM   Specimen: BLOOD RIGHT HAND  Result Value Ref Range Status   Specimen Description BLOOD RIGHT HAND  Final   Special Requests   Final    BOTTLES DRAWN AEROBIC AND ANAEROBIC Blood Culture adequate volume   Culture   Final    NO GROWTH < 24 HOURS Performed at [facility], [place] [address] [place], [place] [address]    Report Status PENDING  Incomplete    RADIOLOGY STUDIES/RESULTS: DG Chest Port 1V same Day  Result  Date: 06/20/2022 CLINICAL DATA:  Shortness of breath. EXAM: PORTABLE CHEST 1 VIEW COMPARISON:  06/18/2022 FINDINGS: Mild cardiac enlargement is unchanged. Persistent small bilateral pleural effusions. Pulmonary vascular congestion. Mild bibasilar atelectasis, unchanged. IMPRESSION: Unchanged mild CHF pattern with bibasilar atelectasis. Electronically Signed   By: Kerby Moors M.D.   On: 06/20/2022 10:03   ECHOCARDIOGRAM COMPLETE  Result Date: 06/19/2022    ECHOCARDIOGRAM REPORT   Patient Name:   Barbara Nelson Date of Exam: 06/19/2022 Medical Rec #:  [id number]         Height:       65.0 in Accession #:    [id number]        Weight:       217.0 lb Date of Birth:  [birth year]        BSA:          2.048 m Patient Age:    32 years          BP:           117/69 mmHg Patient Gender: F                 HR:           81 bpm. Exam Location:  Inpatient Procedure: 2D Echo and Intracardiac Opacification Agent Indications:    pulmonary embolus: r/o PHTN as cause for new onset hypoxia with                 ambulation and negative CT chest for lungs  History:        Patient has prior history of Echocardiogram examinations, most                 recent 09/29/2019. Pericarditis in 2021, Arrythmias:LBBB; Risk                 Factors:Hypertension.  Sonographer:    Harvie Junior Referring Phys: [phone] JARED M GARDNER  Sonographer Comments: Technically difficult study due to poor echo windows and patient is obese.  Image acquisition challenging due to patient body habitus and Image acquisition challenging due to respiratory motion. IMPRESSIONS  1. Left ventricular ejection fraction, by estimation, is 50 to 55%. The left ventricle has low normal function. The left ventricle has no regional wall motion abnormalities. There is mild concentric left ventricular hypertrophy. Left ventricular diastolic parameters are consistent with Grade I diastolic dysfunction (impaired relaxation).  2. Right ventricular systolic function is normal. The right  ventricular size is normal. There is normal pulmonary artery systolic pressure.  3. Mass along the mitral annulus. No evidence of mitral valve regurgitation.  4. The aortic valve was not well visualized. Aortic valve regurgitation is not visualized.  5. The inferior vena cava is normal in size with greater than 50% respiratory variability, suggesting right atrial pressure of 3 mmHg. 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 FINDINGS  Left Ventricle: Left ventricular ejection fraction, by estimation, is 50 to 55%. The left ventricle has low normal function. The left ventricle has no regional wall motion abnormalities. The left ventricular internal cavity size was normal in size. There is mild concentric left ventricular hypertrophy. Left ventricular diastolic parameters are consistent with Grade I diastolic dysfunction (impaired relaxation). Right Ventricle: The right ventricular size is normal. Right ventricular systolic function is normal. There is normal pulmonary artery systolic pressure. The tricuspid regurgitant velocity is 2.48 m/s, and with an assumed right atrial pressure of 3 mmHg,  the estimated right ventricular systolic pressure is 23.5 mmHg. Left Atrium: Left atrial size was normal in size. Right Atrium: Right atrial size was normal in size. Pericardium: There is no evidence of pericardial effusion. Mitral Valve: Mass along the mitral annulus. No evidence of mitral valve regurgitation. Tricuspid Valve: Tricuspid valve regurgitation is mild. Aortic Valve: The aortic valve was not well visualized. Aortic valve regurgitation is not visualized. Aortic valve mean gradient measures 6.0 mmHg. Aortic valve peak gradient measures 10.0 mmHg. Aortic valve area, by VTI measures 2.10 cm. Pulmonic Valve: Pulmonic valve regurgitation is not visualized. Venous: The inferior vena cava is normal in size with greater than 50%  respiratory variability, suggesting right atrial pressure of 3 mmHg. IAS/Shunts: The interatrial septum was not well visualized.  LEFT VENTRICLE PLAX 2D LVIDd:         3.40 cm     Diastology LVIDs:         2.40 cm     LV e' medial:    5.66 cm/s LV PW:         1.10 cm     LV E/e' medial:  13.9 LV IVS:        1.10 cm     LV e' lateral:   5.22 cm/s LVOT diam:     1.80 cm     LV E/e' lateral: 15.1 LV SV:         53 LV SV Index:   26 LVOT Area:     2.54 cm  LV Volumes (MOD) LV vol d, MOD A2C: 52.8 ml LV vol d, MOD A4C: 68.2 ml LV vol s, MOD A2C: 29.1 ml LV vol s, MOD A4C: 40.8 ml LV SV MOD A2C:     23.7 ml LV SV MOD A4C:     68.2 ml LV SV MOD BP:      25.2 ml RIGHT VENTRICLE RV S prime:     17.30 cm/s TAPSE (M-mode): 1.8 cm LEFT ATRIUM  Index LA diam:        3.60 cm 1.76 cm/m LA Vol (A2C):   38.6 ml 18.85 ml/m LA Vol (A4C):   62.7 ml 30.62 ml/m LA Biplane Vol: 48.9 ml 23.88 ml/m  AORTIC VALVE                     PULMONIC VALVE AV Area (Vmax):    2.01 cm      PV Vmax:       0.89 m/s AV Area (Vmean):   1.79 cm      PV Peak grad:  3.2 mmHg AV Area (VTI):     2.10 cm AV Vmax:           158.00 cm/s AV Vmean:          112.000 cm/s AV VTI:            0.251 m AV Peak Grad:      10.0 mmHg AV Mean Grad:      6.0 mmHg LVOT Vmax:         125.00 cm/s LVOT Vmean:        78.900 cm/s LVOT VTI:          0.207 m LVOT/AV VTI ratio: 0.82  AORTA Ao Root diam: 2.80 cm Ao Asc diam:  2.80 cm MITRAL VALVE                TRICUSPID VALVE MV Area (PHT): 3.78 cm     TR Peak grad:   24.6 mmHg MV Decel Time: 201 msec     TR Vmax:        248.00 cm/s MV E velocity: 78.95 cm/s MV A velocity: 120.50 cm/s  SHUNTS MV E/A ratio:  0.66         Systemic VTI:  0.21 m                             Systemic Diam: 1.80 cm Phineas Inches Electronically signed by Phineas Inches Signature Date/Time: 06/19/2022/11:09:53 AM    Final    CT Angio Chest PE W and/or Wo Contrast  Result Date: 06/18/2022 CLINICAL DATA:  Shortness of breath. EXAM: CT ANGIOGRAPHY  CHEST WITH CONTRAST TECHNIQUE: Multidetector CT imaging of the chest was performed using the standard protocol during bolus administration of intravenous contrast. Multiplanar CT image reconstructions and MIPs were obtained to evaluate the vascular anatomy. RADIATION DOSE REDUCTION: This exam was performed according to the departmental dose-optimization program which includes automated exposure control, adjustment of the mA and/or kV according to patient size and/or use of iterative reconstruction technique. CONTRAST:  123mL OMNIPAQUE IOHEXOL 350 MG/ML SOLN COMPARISON:  September 12, 2015. FINDINGS: Cardiovascular: Satisfactory opacification of the pulmonary arteries to the segmental level. No evidence of pulmonary embolism. Mild cardiomegaly. No pericardial effusion. Mediastinum/Nodes: No enlarged mediastinal, hilar, or axillary lymph nodes. Thyroid gland, trachea, and esophagus demonstrate no significant findings. Lungs/Pleura: Minimal right pleural effusion is noted. No pneumothorax is noted. Minimal bibasilar subsegmental atelectasis is noted. Stable probable scarring is seen in right midlung related to prior lobectomy. Upper Abdomen: No acute abnormality. Musculoskeletal: No chest wall abnormality. No acute or significant osseous findings. Review of the MIP images confirms the above findings. IMPRESSION: No definite evidence of pulmonary embolus. Minimal right pleural effusion with minimal bibasilar subsegmental atelectasis. Electronically Signed   By: Marijo Conception M.D.   On: 06/18/2022 13:05     LOS: 1 day  Oren Binet, MD  [facility]    To contact the attending provider between 7A-7P or the covering provider during after hours 7P-7A, please log into the web site [email or web address] and access using universal [place] password for that web site. If you do not have the password, please call the hospital operator.  06/20/2022, 11:46 AM

## 2022-06-20 NOTE — Progress Notes (Signed)
Physical Therapy Treatment Patient Details Name: Barbara Nelson MRN: [id number] DOB: [birth year] Today's Date: 06/20/2022   History of Present Illness 82 y.o. female presents to [facility] on 06/18/2022 with productive cough and SOB. Pt found to be hyponatremic. PMH includes HTN, PAF, mycetoma s/p LML lobectomy of lung, Pericardectomy for tamponade from viral pericarditis.    PT Comments    Pt seated in chair on arrival.  She reports her bottom is sore and she is ready to get back to bed.  Pt continues to benefit from skilled rehab in a post acute setting to maximize functional gains and improve strength and endurance before returning home.  Gt limited to steps back to recliner as patient desats to 86% on 6L [place].    Recommendations for follow up therapy are one component of a multi-disciplinary discharge planning process, led by the attending physician.  Recommendations may be updated based on patient status, additional functional criteria and insurance authorization.  Follow Up Recommendations        Assistance Recommended at Discharge Intermittent Supervision/Assistance  Patient can return home with the following A little help with walking and/or transfers;A lot of help with bathing/dressing/bathroom;Assistance with cooking/housework;Assist for transportation;Help with stairs or ramp for entrance   Equipment Recommendations  BSC/3in1    Recommendations for Other Services       Precautions / Restrictions Precautions Precautions: Fall Restrictions Weight Bearing Restrictions: No     Mobility  Bed Mobility Overal bed mobility: Needs Assistance Bed Mobility: Sit to Supine       Sit to supine: Mod assist   General bed mobility comments: Pt had poor ability to return back to bed this pm.  Required assistance to lower trunk, lift B LEs and position hips and trunk in bed.  Pt noticeably fatigued this session.    Transfers Overall transfer level: Needs assistance Equipment  used: Rolling walker (2 wheels) Transfers: Sit to/from Stand, Bed to chair/wheelchair/BSC Sit to Stand: Mod assist           General transfer comment: Increased time with rocking momentum to achieve standing from multiple surface heights.  Pt with desat in standing on 6L to 86%, returned greater than 90% with seated rest break.    Ambulation/Gait Ambulation/Gait assistance:  (only able to perform steps from bed to recliner due to sats and fatigue.)                 Stairs             Wheelchair Mobility    Modified Rankin (Stroke Patients Only)       Balance Overall balance assessment: Needs assistance Sitting-balance support: No upper extremity supported, Feet supported Sitting balance-Leahy Scale: Fair       Standing balance-Leahy Scale: Poor Standing balance comment: minG - min A with fatigue                            Cognition Arousal/Alertness: Awake/alert Behavior During Therapy: Flat affect Overall Cognitive Status: Impaired/Different from baseline Area of Impairment: Problem solving, Safety/judgement, Following commands                       Following Commands: Follows one step commands consistently, Follows one step commands with increased time Safety/Judgement: Decreased awareness of safety   Problem Solving: Slow processing, Requires verbal cues, Difficulty sequencing General Comments: slow processing, but following all commands with increased time. Required step by step  cues during transfers and bed mobility        Exercises      General Comments General comments (skin integrity, edema, etc.): on 3L [place] at rest when OT entered room. Desatting as low as 85% during functional mobility      Pertinent Vitals/Pain Pain Assessment Pain Assessment: Faces Faces Pain Scale: Hurts little more Pain Location: back Pain Descriptors / Indicators: Grimacing Pain Intervention(s): Monitored during session, Repositioned     Home Living Family/patient expects to be discharged to:: Private residence Living Arrangements: Children Available Help at Discharge: Family;Available 24 hours/day Type of Home: House Home Access: Stairs to enter Entrance Stairs-Rails: Can reach both Entrance Stairs-Number of Steps: 1   Home Layout: Multi-level;Able to live on main level with bedroom/bathroom Home Equipment: Toilet riser;Grab bars - toilet;Grab bars - tub/shower;Tub bench;Standard Walker      Prior Function            PT Goals (current goals can now be found in the care plan section) Acute Rehab PT Goals Patient Stated Goal: to improve strength, reduced assistance requirements when mobilizing Potential to Achieve Goals: Good Progress towards PT goals: Progressing toward goals    Frequency    Min 3X/week      PT Plan Current plan remains appropriate    Co-evaluation              AM-PAC PT "6 Clicks" Mobility   Outcome Measure  Help needed turning from your back to your side while in a flat bed without using bedrails?: A Little Help needed moving from lying on your back to sitting on the side of a flat bed without using bedrails?: A Little Help needed moving to and from a bed to a chair (including a wheelchair)?: A Little Help needed standing up from a chair using your arms (e.g., wheelchair or bedside chair)?: A Little Help needed to walk in hospital room?: A Lot Help needed climbing 3-5 steps with a railing? : Total 6 Click Score: 15    End of Session Equipment Utilized During Treatment: Oxygen;Gait belt Activity Tolerance: Patient limited by fatigue Patient left: in chair;with call bell/phone within reach;with family/visitor present Nurse Communication: Mobility status PT Visit Diagnosis: Other abnormalities of gait and mobility (R26.89);Muscle weakness (generalized) (M62.81)     Time: 5427-0623 PT Time Calculation (min) (ACUTE ONLY): 28 min  Charges:  $Therapeutic Activity: 23-37  mins                     Erasmo Leventhal , PTA Acute Rehabilitation Services Office [phone],    Cristela Blue 06/20/2022, 3:28 PM

## 2022-06-20 NOTE — Consult Note (Signed)
Cardiology Consultation   Patient ID: Barbara Nelson MRN: [id number]; DOB: 11/03/1940  Admit date: 06/18/2022 Date of Consult: 06/20/2022  PCP:  Cari Caraway, [facility] Prov[facility] Cardiologist:  Fransico Him, MD     Patient Profile:   Barbara Nelson is a 81 y.o. female with a hx of hypertension, prior anterior pericardiectomy for tamponade secondary to viral pericarditis, history of pulmonary blastomycosis status post right upper lobectomy '14 who is being seen 06/20/2022 for the evaluation of CHF/possible mitral valve mass at the request of Dr. Sloan Leiter.  History of Present Illness:   Barbara Nelson is an 82 year old female with past medical history noted above.  She follows with Dr. Radford Pax as an outpatient.  She developed viral pericarditis with tamponade requiring a pericardial window and anterior partial pericardiectomy in 2013 with development of postop atrial fibrillation.  Wore cardiac monitor 10/2015 for recurrent palpitations which only showed sinus rhythm.  Echo 09/2019 with LVEF of 60 to 65%, no regional wall motion abnormality, grade 1 diastolic dysfunction, normal RV size and function, no significant valvular disease.  She was last seen in the office on 10/2021 and was without complaint.  She had had no recurrence of atrial fibrillation and had been maintained on metoprolol XL 25 mg daily as well as HCTZ 12.5 mg daily.   She presented to the ED on 2/1 with complaints of shortness of breath and dyspnea on exertion.  Admission labs showed sodium 127, potassium 3.3, creatinine 0.71, BNP 50, high-sensitivity troponin 11>> 10, WBC 6.6, hemoglobin 13, D-dimer 1.13.  EKG showed sinus rhythm, left bundle Amiaya Mcneeley block with prolonged QTc.  Chest x-ray showed new confluent opacities in the right lateral mid lung and upper lung concerning for pulmonary edema versus chronic interstitial lung disease.  She was admitted to internal medicine for further management.  Echo 2/2  showed LVEF of 50 to 55%, no regional wall motion abnormality, grade 1 diastolic dysfunction, normal RV size and function, mass along the mitral annulus.  Recommendations for consideration of TEE.  Cardiology asked to evaluate.  She's sitting up in bed. Family at the bedside. Not conversive  Past Medical History:  Diagnosis Date   Benign essential HTN 10/31/2015   Cardiac tamponade due to viral pericarditis    LBBB (left bundle Yvanna Vidas block)    Lung mass    fungal infection s/p resection at [facility]   Postoperative atrial fibrillation [facility])    Pulmonary nodule     Past Surgical History:  Procedure Laterality Date   ABDOMINAL HYSTERECTOMY  1988   chest tube placement  09/03/11   LUNG REMOVAL, PARTIAL     right knee arthoscopy with meniscal repair     subxiphoid pericardial window and drainage anterior partial pericardiectomy  09/03/11     Home Medications:  Prior to Admission medications   Medication Sig Start Date End Date Taking? Authorizing Provider  acetaminophen (TYLENOL) 325 MG tablet Take 650 mg by mouth daily as needed for mild pain, moderate pain, fever or headache.   Yes [provider]  metoprolol succinate (TOPROL-XL) 25 MG 24 hr tablet TAKE 1 TABLET (25 MG TOTAL) BY MOUTH DAILY. 06/16/22  Yes Turner, Eber Hong, MD  pregabalin (LYRICA) 25 MG capsule Take 25 mg by mouth at bedtime.   Yes [provider]  Propylene Glycol (SYSTANE COMPLETE) 0.6 % SOLN Place 1 drop into both eyes 3 (three) times daily.   Yes [provider]  metFORMIN (GLUCOPHAGE-XR) 500 MG 24  hr tablet Take 500 mg by mouth daily with supper. Patient not taking: Reported on 06/18/2022    [provider]    Inpatient Medications: Scheduled Meds:  diclofenac Sodium  2 g Topical QID   enoxaparin (LOVENOX) injection  40 mg Subcutaneous Q24H   feeding supplement  237 mL Oral TID BM   furosemide  40 mg Intravenous Daily   furosemide  40 mg Intravenous Once   insulin aspart  0-9  Units Subcutaneous TID WC   ipratropium-albuterol  3 mL Nebulization TID   levofloxacin  750 mg Oral Daily   [START ON 06/21/2022] metoprolol succinate  12.5 mg Oral Daily   polyvinyl alcohol  1 drop Both Eyes TID   potassium chloride  40 mEq Oral Once   pregabalin  25 mg Oral QHS   Continuous Infusions:  albumin human     PRN Meds: acetaminophen **OR** acetaminophen, albuterol, ondansetron **OR** ondansetron (ZOFRAN) IV  Allergies:    Allergies  Allergen Reactions   Latex Rash   Penicillins Rash and Itching   Tape Hives and Rash   Z-Pak [Azithromycin] Rash and Other (See Comments)    Mouth sores   Eggs Or Egg-Derived Products Nausea And Vomiting   Amoxil [Amoxicillin] Rash   Sulfa Antibiotics Rash    Social History:   Social History   Socioeconomic History   Marital status: Single    Spouse name: Not on file   Number of children: 4   Years of education: Not on file   Highest education level: Not on file  Occupational History   Occupation: unemployed Therapist, sports retired  Tobacco Use   Smoking status: Former    Packs/day: 1.50    Years: 10.00    Total pack years: 15.00    Types: Cigarettes    Quit date: 05/19/1979    Years since quitting: 43.1   Smokeless tobacco: Never  Vaping Use   Vaping Use: Never used  Substance and Sexual Activity   Alcohol use: No   Drug use: No   Sexual activity: Not on file  Other Topics Concern   Not on file  Social History Narrative   Not on file   Social Determinants of Health   Financial Resource Strain: Not on file  Food Insecurity: No Food Insecurity (06/18/2022)   Hunger Vital Sign    Worried About Running Out of Food in the Last Year: Never true    Ran Out of Food in the Last Year: Never true  Transportation Needs: No Transportation Needs (06/18/2022)   PRAPARE - Hydrologist (Medical): No    Lack of Transportation (Non-Medical): No  Physical Activity: Not on file  Stress: Not on file  Social  Connections: Not on file  Intimate Partner Violence: Not At Risk (06/18/2022)   Humiliation, Afraid, Rape, and Kick questionnaire    Fear of Current or Ex-Partner: No    Emotionally Abused: No    Physically Abused: No    Sexually Abused: No    Family History:    Family History  Problem Relation Age of Onset   Hypertension Father    Stroke Father    Hypertension Mother    Ovarian cancer Mother    Prostate cancer Brother    Thyroid disease Sister    Hypertension Sister    Leukemia Maternal Aunt      ROS:  Please see the history of present illness.   All other ROS reviewed and negative.  Physical Exam/Data:   Vitals:   06/20/22 0440 06/20/22 0800 06/20/22 0900 06/20/22 1212  BP: 100/61 106/64 114/80 99/69  Pulse: 81 87 90 88  Resp: 18 12 18 20   Temp: 97.9 F (36.6 C) 98.2 F (36.8 C) 98.3 F (36.8 C) 98.3 F (36.8 C)  TempSrc: Oral Oral Oral Oral  SpO2:  91% 96% 93%  Weight:      Height:        Intake/Output Summary (Last 24 hours) at 06/20/2022 1329 Last data filed at 06/20/2022 1200 Gross per 24 hour  Intake --  Output 1650 ml  Net -1650 ml      06/18/2022    7:38 AM 05/12/2022   10:11 PM 11/14/2021    9:38 AM  Last 3 Weights  Weight (lbs) 217 lb 221 lb 233 lb 6.4 oz  Weight (kg) 98.431 kg 100.245 kg 105.87 kg     Body mass index is 36.11 kg/m.  General:  Sitting up, mild wob HEENT: normal Neck: no sig JVD Vascular: No carotid bruits; Distal pulses 2+ bilaterally Cardiac:  normal S1, S2; RRR; no murmur  Lungs:  clear to auscultation bilaterally, no wheezing, rhonchi or rales  Abd: soft, nontender, no hepatomegaly  Ext: no edema Musculoskeletal:  No deformities, BUE and BLE strength normal and equal Skin: warm and dry  Neuro:  CNs 2-12 intact, no focal abnormalities noted Psych:  Normal affect   EKG:  The EKG was personally reviewed and demonstrates:  sinus with LBBB   Telemetry:  Telemetry was personally reviewed and demonstrates:  sinus  rhythm  Relevant CV Studies:  TTE 06/19/2022 1. Left ventricular ejection fraction, by estimation, is 50 to 55%. The  left ventricle has low normal function. The left ventricle has no regional  wall motion abnormalities. There is mild concentric left ventricular hypertrophy. Left ventricular diastolic parameters are consistent with Grade I diastolic dysfunction  (impaired relaxation).   2. Right ventricular systolic function is normal. The right ventricular  size is normal. There is normal pulmonary artery systolic pressure.   3. Mass along the mitral annulus. No evidence of mitral valve  regurgitation.   4. The aortic valve was not well visualized. Aortic valve regurgitation  is not visualized.   5. The inferior vena cava is normal in size with greater than 50%  respiratory variability, suggesting right atrial pressure of 3 mmHg.   Laboratory Data:  High Sensitivity Troponin:   Recent Labs  Lab 06/18/22 0803 06/18/22 1015  TROPONINIHS 11 10     Chemistry Recent Labs  Lab 06/18/22 0803 06/19/22 0657 06/20/22 0315  NA 127* 131* 130*  K 3.3* 3.6 3.8  CL 91* 95* 93*  CO2 28 28 27   GLUCOSE 133* 109* 93  BUN 8 7* 8  CREATININE 0.71 0.70 0.77  CALCIUM 8.1* 8.3* 8.3*  MG 1.8 1.9 1.8  GFRNONAA >60 >60 >60  ANIONGAP 8 8 10     Recent Labs  Lab 06/19/22 0657 06/20/22 0315  PROT 5.7* 5.8*  ALBUMIN 1.7* 1.7*  AST 58* 56*  ALT 34 36  ALKPHOS 76 66  BILITOT 0.9 0.9   Lipids No results for input(s): "CHOL", "TRIG", "HDL", "LABVLDL", "LDLCALC", "CHOLHDL" in the last 168 hours.  Hematology Recent Labs  Lab 06/18/22 0803 06/19/22 0657  WBC 6.6 7.7  RBC 4.66 4.37  HGB 13.0 12.4  HCT 38.3 36.2  MCV 82.2 82.8  MCH 27.9 28.4  MCHC 33.9 34.3  RDW 14.2 14.3  PLT 171 155  Thyroid No results for input(s): "TSH", "FREET4" in the last 168 hours.  BNP Recent Labs  Lab 06/18/22 0803  BNP 50.2    DDimer  Recent Labs  Lab 06/18/22 1051  DDIMER 1.13*      Radiology/Studies:  DG Chest Port 1V same Day  Result Date: 06/20/2022 CLINICAL DATA:  Shortness of breath. EXAM: PORTABLE CHEST 1 VIEW COMPARISON:  06/18/2022 FINDINGS: Mild cardiac enlargement is unchanged. Persistent small bilateral pleural effusions. Pulmonary vascular congestion. Mild bibasilar atelectasis, unchanged. IMPRESSION: Unchanged mild CHF pattern with bibasilar atelectasis. Electronically Signed   By: Kerby Moors M.D.   On: 06/20/2022 10:03   ECHOCARDIOGRAM COMPLETE  Result Date: 06/19/2022    ECHOCARDIOGRAM REPORT   Patient Name:   Barbara Nelson Date of Exam: 06/19/2022 Medical Rec #:  [id number]         Height:       65.0 in Accession #:    [id number]        Weight:       217.0 lb Date of Birth:  Jan 05, 1941        BSA:          2.048 m Patient Age:    1 years          BP:           117/69 mmHg Patient Gender: F                 HR:           81 bpm. Exam Location:  Inpatient Procedure: 2D Echo and Intracardiac Opacification Agent Indications:    pulmonary embolus: r/o PHTN as cause for new onset hypoxia with                 ambulation and negative CT chest for lungs  History:        Patient has prior history of Echocardiogram examinations, most                 recent 09/29/2019. Pericarditis in 2021, Arrythmias:LBBB; Risk                 Factors:Hypertension.  Sonographer:    Harvie Junior Referring Phys: [phone] JARED M GARDNER  Sonographer Comments: Technically difficult study due to poor echo windows and patient is obese. Image acquisition challenging due to patient body habitus and Image acquisition challenging due to respiratory motion. IMPRESSIONS  1. Left ventricular ejection fraction, by estimation, is 50 to 55%. The left ventricle has low normal function. The left ventricle has no regional wall motion abnormalities. There is mild concentric left ventricular hypertrophy. Left ventricular diastolic parameters are consistent with Grade I diastolic dysfunction (impaired  relaxation).  2. Right ventricular systolic function is normal. The right ventricular size is normal. There is normal pulmonary artery systolic pressure.  3. Mass along the mitral annulus. No evidence of mitral valve regurgitation.  4. The aortic valve was not well visualized. Aortic valve regurgitation is not visualized.  5. The inferior vena cava is normal in size with greater than 50% respiratory variability, suggesting right atrial pressure of 3 mmHg. 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 FINDINGS  Left Ventricle: Left ventricular ejection fraction, by estimation, is 50 to 55%. The left ventricle has low normal function. The left ventricle has no regional wall motion abnormalities. The left ventricular internal cavity size was normal in size. There is mild concentric left ventricular hypertrophy. Left  ventricular diastolic parameters are consistent with Grade I diastolic dysfunction (impaired relaxation). Right Ventricle: The right ventricular size is normal. Right ventricular systolic function is normal. There is normal pulmonary artery systolic pressure. The tricuspid regurgitant velocity is 2.48 m/s, and with an assumed right atrial pressure of 3 mmHg,  the estimated right ventricular systolic pressure is 21.1 mmHg. Left Atrium: Left atrial size was normal in size. Right Atrium: Right atrial size was normal in size. Pericardium: There is no evidence of pericardial effusion. Mitral Valve: Mass along the mitral annulus. No evidence of mitral valve regurgitation. Tricuspid Valve: Tricuspid valve regurgitation is mild. Aortic Valve: The aortic valve was not well visualized. Aortic valve regurgitation is not visualized. Aortic valve mean gradient measures 6.0 mmHg. Aortic valve peak gradient measures 10.0 mmHg. Aortic valve area, by VTI measures 2.10 cm. Pulmonic Valve: Pulmonic valve regurgitation is not  visualized. Venous: The inferior vena cava is normal in size with greater than 50% respiratory variability, suggesting right atrial pressure of 3 mmHg. IAS/Shunts: The interatrial septum was not well visualized.  LEFT VENTRICLE PLAX 2D LVIDd:         3.40 cm     Diastology LVIDs:         2.40 cm     LV e' medial:    5.66 cm/s LV PW:         1.10 cm     LV E/e' medial:  13.9 LV IVS:        1.10 cm     LV e' lateral:   5.22 cm/s LVOT diam:     1.80 cm     LV E/e' lateral: 15.1 LV SV:         53 LV SV Index:   26 LVOT Area:     2.54 cm  LV Volumes (MOD) LV vol d, MOD A2C: 52.8 ml LV vol d, MOD A4C: 68.2 ml LV vol s, MOD A2C: 29.1 ml LV vol s, MOD A4C: 40.8 ml LV SV MOD A2C:     23.7 ml LV SV MOD A4C:     68.2 ml LV SV MOD BP:      25.2 ml RIGHT VENTRICLE RV S prime:     17.30 cm/s TAPSE (M-mode): 1.8 cm LEFT ATRIUM             Index LA diam:        3.60 cm 1.76 cm/m LA Vol (A2C):   38.6 ml 18.85 ml/m LA Vol (A4C):   62.7 ml 30.62 ml/m LA Biplane Vol: 48.9 ml 23.88 ml/m  AORTIC VALVE                     PULMONIC VALVE AV Area (Vmax):    2.01 cm      PV Vmax:       0.89 m/s AV Area (Vmean):   1.79 cm      PV Peak grad:  3.2 mmHg AV Area (VTI):     2.10 cm AV Vmax:           158.00 cm/s AV Vmean:          112.000 cm/s AV VTI:            0.251 m AV Peak Grad:      10.0 mmHg AV Mean Grad:      6.0 mmHg LVOT Vmax:         125.00 cm/s LVOT Vmean:        78.900  cm/s LVOT VTI:          0.207 m LVOT/AV VTI ratio: 0.82  AORTA Ao Root diam: 2.80 cm Ao Asc diam:  2.80 cm MITRAL VALVE                TRICUSPID VALVE MV Area (PHT): 3.78 cm     TR Peak grad:   24.6 mmHg MV Decel Time: 201 msec     TR Vmax:        248.00 cm/s MV E velocity: 78.95 cm/s MV A velocity: 120.50 cm/s  SHUNTS MV E/A ratio:  0.66         Systemic VTI:  0.21 m                             Systemic Diam: 1.80 cm Phineas Inches Electronically signed by Phineas Inches Signature Date/Time: 06/19/2022/11:09:53 AM    Final    CT Angio Chest PE W and/or Wo  Contrast  Result Date: 06/18/2022 CLINICAL DATA:  Shortness of breath. EXAM: CT ANGIOGRAPHY CHEST WITH CONTRAST TECHNIQUE: Multidetector CT imaging of the chest was performed using the standard protocol during bolus administration of intravenous contrast. Multiplanar CT image reconstructions and MIPs were obtained to evaluate the vascular anatomy. RADIATION DOSE REDUCTION: This exam was performed according to the departmental dose-optimization program which includes automated exposure control, adjustment of the mA and/or kV according to patient size and/or use of iterative reconstruction technique. CONTRAST:  147mL OMNIPAQUE IOHEXOL 350 MG/ML SOLN COMPARISON:  September 12, 2015. FINDINGS: Cardiovascular: Satisfactory opacification of the pulmonary arteries to the segmental level. No evidence of pulmonary embolism. Mild cardiomegaly. No pericardial effusion. Mediastinum/Nodes: No enlarged mediastinal, hilar, or axillary lymph nodes. Thyroid gland, trachea, and esophagus demonstrate no significant findings. Lungs/Pleura: Minimal right pleural effusion is noted. No pneumothorax is noted. Minimal bibasilar subsegmental atelectasis is noted. Stable probable scarring is seen in right midlung related to prior lobectomy. Upper Abdomen: No acute abnormality. Musculoskeletal: No chest wall abnormality. No acute or significant osseous findings. Review of the MIP images confirms the above findings. IMPRESSION: No definite evidence of pulmonary embolus. Minimal right pleural effusion with minimal bibasilar subsegmental atelectasis. Electronically Signed   By: Marijo Conception M.D.   On: 06/18/2022 13:05   DG Chest 2 View  Result Date: 06/18/2022 CLINICAL DATA:  SOB EXAM: CHEST - 2 VIEW COMPARISON:  May 12, 2022 FINDINGS: The cardiomediastinal silhouette is unchanged in contour. Trace bilateral pleural effusions. No pneumothorax. Background of diffuse interstitial prominence, similar in comparison to prior. There is more  confluent opacity in the RIGHT lateral mid lung and upper lung which are new in comparison to prior. Visualized abdomen is unremarkable. Multilevel degenerative changes of the thoracic spine. IMPRESSION: New confluent opacities in the RIGHT lateral mid lung and upper lung. Differential considerations include infection or superimposed atelectasis on a background of pulmonary edema versus chronic interstitial lung disease. Recommend follow-up PA and lateral chest radiograph 4 weeks after appropriate treatment to assess for resolution. Electronically Signed   By: Valentino Saxon M.D.   On: 06/18/2022 08:38     Assessment and Plan:   Hypoxia 4L O2: she has mild effusions. Don't have high suspicion this is primarily cardiac related. BNP is very low. RA pressure was 8 and no significant TR.  -Can continue IV lasix until back to baseline.  -Agree with pulmonary consult  Mitral Annular mass: more than likely this is MAC. Unlikely to  be a vegetation or thrombus. No malignancy hx, and that would not be a likely location. Can repeat surface limited TTE in the future to follow-up.   Otherwise cardiology will sign off  Risk Assessment/Risk Scores:      For questions or updates, please contact [place] Please consult [email or web address] for contact info under

## 2022-06-21 DIAGNOSIS — I10 Essential (primary) hypertension: Secondary | ICD-10-CM | POA: Diagnosis not present

## 2022-06-21 DIAGNOSIS — J189 Pneumonia, unspecified organism: Secondary | ICD-10-CM | POA: Diagnosis not present

## 2022-06-21 DIAGNOSIS — J9601 Acute respiratory failure with hypoxia: Secondary | ICD-10-CM | POA: Diagnosis not present

## 2022-06-21 DIAGNOSIS — E871 Hypo-osmolality and hyponatremia: Secondary | ICD-10-CM | POA: Diagnosis not present

## 2022-06-21 LAB — GLUCOSE, CAPILLARY
Glucose-Capillary: 112 mg/dL — ABNORMAL HIGH (ref 70–99)
Glucose-Capillary: 129 mg/dL — ABNORMAL HIGH (ref 70–99)
Glucose-Capillary: 151 mg/dL — ABNORMAL HIGH (ref 70–99)
Glucose-Capillary: 130 mg/dL — ABNORMAL HIGH (ref 70–99)

## 2022-06-21 LAB — BASIC METABOLIC PANEL
Anion gap: 10 (ref 5–15)
BUN: 10 mg/dL (ref 8–23)
CO2: 27 mmol/L (ref 22–32)
Calcium: 8.4 mg/dL — ABNORMAL LOW (ref 8.9–10.3)
Chloride: 90 mmol/L — ABNORMAL LOW (ref 98–111)
Creatinine, Ser: 0.78 mg/dL (ref 0.44–1.00)
GFR, Estimated: >60 mL/min (ref >60)
Glucose, Bld: 117 mg/dL — ABNORMAL HIGH (ref 70–99)
Potassium: 3.6 mmol/L (ref 3.5–5.1)
Sodium: 127 mmol/L — ABNORMAL LOW (ref 135–145)

## 2022-06-21 LAB — MAGNESIUM: Magnesium: 1.6 mg/dL — ABNORMAL LOW (ref 1.7–2.4)

## 2022-06-21 MED ORDER — POLYETHYLENE GLYCOL 3350 17 G PO PACK
34.0000 g | PACK | Freq: Once | ORAL | Status: AC
  Administered 2022-06-21: 34 g via ORAL
  Filled 2022-06-21: qty 2

## 2022-06-21 NOTE — Progress Notes (Addendum)
PROGRESS NOTE        PATIENT DETAILS Name: Barbara Nelson Age: 82 y.o. Sex: female Date of Birth: 1941/03/10 Admit Date: 06/18/2022 Admitting Physician Karmen Bongo, MD PYP:[id number], Abigail Butts, MD  Brief Summary: Patient is a 82 y.o.  female with history of HTN, prior pericardiectomy for tamponade from viral pericarditis, prior history of pulmonary blastomycosis-s/p right lower lobectomy 2014--presented with 2-3-day history of exertional dyspnea-found to have mild acute hypoxic respiratory failure requiring around 2 L of oxygen to maintain O2 saturation above 88%.  Significant events: 2/1>> admit to [facility]-hypoxia-?suspected CHF exacerbation.  Admit to [facility].  Significant studies: 2/1>> CT angio chest: No PE, minimal right pleural effusion, bibasilar subsegmental atelectasis 2/2>> echo: EF 50-55%, mass along the mitral annulus.  Grade 1 diastolic dysfunction  Significant microbiology data: 2/2>> COVID/influenza/RSV PCR: Negative 2/2>> respiratory virus panel: Negative  Procedures: None  Consults: None  Subjective: No major issues overnight-daughter at bedside reports mild confusion at times.  She is awake/alert answering all my questions appropriately.  Continues to cough-down to 2 L..  Objective: Vitals: Blood pressure 117/68, pulse 84, temperature 98.6 F (37 C), temperature source Oral, resp. rate 18, height 5\' 5"  (1.651 m), weight 98.4 kg, SpO2 93 %.   Exam: Gen Exam:Alert awake-not in any distress HEENT:atraumatic, normocephalic Chest: B/L clear to auscultation anteriorly CVS:S1S2 regular Abdomen:soft non tender, non distended Extremities:trace edema Neurology: Non focal Skin: no rash  Pertinent Labs/Radiology:    Latest Ref Rng & Units 06/19/2022    6:57 AM 06/18/2022    8:03 AM 05/13/2022   12:13 AM  CBC  WBC 4.0 - 10.5 K/uL 7.7  6.6  6.0   Hemoglobin 12.0 - 15.0 g/dL 12.4  13.0  14.1   Hematocrit 36.0 - 46.0 % 36.2  38.3  42.1    Platelets 150 - 400 K/uL 155  171  128     Lab Results  Component Value Date   NA 127 (L) 06/21/2022   K 3.6 06/21/2022   CL 90 (L) 06/21/2022   CO2 27 06/21/2022     Assessment/Plan: Acute hypoxic respiratory failure Unclear etiology Suspicion for PNA-and some amount of heart failure playing a role Down to 2 L of oxygen Continue Levaquin Continue Lasix to ensure negative balance Mobilize/pulmonary toileting/incentive spirometry Reassess 2/5 Appreciate PCCM/cards input.   Hyponatremia Multifactorial-due to HCTZ/volume overload from perhaps underlying CHF Continue IV Lasix-sodium level slightly down today but overall much better.  HFpEF exacerbation See above  CAP See above Continue levofloxacin.  Mass along the mitral valve annulus Seen incidentally on echo Appreciate cardiology input-recommendations are to repeat echo in a few weeks.  Hypoalbuminemia Significant drop in albumin levels Unclear if this is from acute illness or from proteinuria (history of DM)  History of thoracoscopy with right lung lobectomy 2014 History of pulmonary blastomycosis  History of pericardial effusion-requiring pericardiectomy  HTN BP soft Decrease metoprolol to allow more room for diuresis  DM-2 (A1c 6.9 on 2/2) Metformin on hold Monitor CBGs on SSI  Recent Labs    06/20/22 1703 06/20/22 2139 06/21/22 0800  GLUCAP 131* 104* 112*    Debility/deconditioning Significant decline in function since late December when patient had a mechanical fall Poor oral intake/appetite-very frail Encouraged ambulation with therapy-encouraged oral intake-family to decide whether to pursue SNF versus home health services.   Obesity: Estimated body mass index  is 36.11 kg/m as calculated from the following:   Height as of this encounter: 5\' 5"  (1.651 m).   Weight as of this encounter: 98.4 kg.   Code status:   Code Status: Full Code   DVT Prophylaxis: enoxaparin (LOVENOX) injection  40 mg Start: 06/19/22 1000   Family Communication: Daughter at bedside   Disposition Plan: Status is: Observation The patient will require care spanning > 2 midnights and should be moved to inpatient because: Severity of illness   Planned Discharge Destination:Home vs SNF   Diet: Diet Order             Diet heart healthy/carb modified Room service appropriate? Yes; Fluid consistency: Thin; Fluid restriction: 1500 mL Fluid  Diet effective now                     Antimicrobial agents: Anti-infectives (From admission, onward)    Start     Dose/Rate Route Frequency Ordered Stop   06/20/22 1030  levofloxacin (LEVAQUIN) tablet 750 mg        750 mg Oral Daily 06/20/22 0932 06/25/22 0959   06/19/22 1200  doxycycline (VIBRA-TABS) tablet 100 mg  Status:  Discontinued        100 mg Oral Every 12 hours 06/19/22 1110 06/20/22 0917        MEDICATIONS: Scheduled Meds:  diclofenac Sodium  2 g Topical QID   enoxaparin (LOVENOX) injection  40 mg Subcutaneous Q24H   feeding supplement  237 mL Oral TID BM   furosemide  40 mg Intravenous Daily   insulin aspart  0-9 Units Subcutaneous TID WC   levofloxacin  750 mg Oral Daily   metoprolol succinate  12.5 mg Oral Daily   polyvinyl alcohol  1 drop Both Eyes TID   pregabalin  25 mg Oral QHS   Continuous Infusions: PRN Meds:.acetaminophen **OR** acetaminophen, albuterol, ondansetron **OR** ondansetron (ZOFRAN) IV   I have personally reviewed following labs and imaging studies  LABORATORY DATA: CBC: Recent Labs  Lab 06/18/22 0803 06/19/22 0657  WBC 6.6 7.7  NEUTROABS 4.4  --   HGB 13.0 12.4  HCT 38.3 36.2  MCV 82.2 82.8  PLT 171 155     Basic Metabolic Panel: Recent Labs  Lab 06/18/22 0803 06/19/22 0657 06/20/22 0315 06/21/22 0721  NA 127* 131* 130* 127*  K 3.3* 3.6 3.8 3.6  CL 91* 95* 93* 90*  CO2 28 28 27 27   GLUCOSE 133* 109* 93 117*  BUN 8 7* 8 10  CREATININE 0.71 0.70 0.77 0.78  CALCIUM 8.1* 8.3* 8.3*  8.4*  MG 1.8 1.9 1.8 1.6*     GFR: Estimated Creatinine Clearance: 63 mL/min (by C-G formula based on SCr of 0.78 mg/dL).  Liver Function Tests: Recent Labs  Lab 06/19/22 0657 06/20/22 0315  AST 58* 56*  ALT 34 36  ALKPHOS 76 66  BILITOT 0.9 0.9  PROT 5.7* 5.8*  ALBUMIN 1.7* 1.7*    No results for input(s): "LIPASE", "AMYLASE" in the last 168 hours. No results for input(s): "AMMONIA" in the last 168 hours.  Coagulation Profile: No results for input(s): "INR", "PROTIME" in the last 168 hours.  Cardiac Enzymes: No results for input(s): "CKTOTAL", "CKMB", "CKMBINDEX", "TROPONINI" in the last 168 hours.  BNP (last 3 results) No results for input(s): "PROBNP" in the last 8760 hours.  Lipid Profile: No results for input(s): "CHOL", "HDL", "LDLCALC", "TRIG", "CHOLHDL", "LDLDIRECT" in the last 72 hours.  Thyroid Function Tests: No results for  input(s): "TSH", "T4TOTAL", "FREET4", "T3FREE", "THYROIDAB" in the last 72 hours.  Anemia Panel: No results for input(s): "VITAMINB12", "FOLATE", "FERRITIN", "TIBC", "IRON", "RETICCTPCT" in the last 72 hours.  Urine analysis:    Component Value Date/Time   COLORURINE YELLOW 06/19/2022 1456   APPEARANCEUR HAZY (A) 06/19/2022 1456   LABSPEC 1.012 06/19/2022 1456   PHURINE 7.0 06/19/2022 1456   GLUCOSEU NEGATIVE 06/19/2022 1456   HGBUR NEGATIVE 06/19/2022 1456   BILIRUBINUR NEGATIVE 06/19/2022 1456   KETONESUR NEGATIVE 06/19/2022 1456   PROTEINUR 100 (A) 06/19/2022 1456   UROBILINOGEN 0.2 09/15/2011 0530   NITRITE NEGATIVE 06/19/2022 1456   LEUKOCYTESUR NEGATIVE 06/19/2022 1456    Sepsis Labs: Lactic Acid, Venous No results found for: "LATICACIDVEN"  MICROBIOLOGY: Recent Results (from the past 240 hour(s))  Resp panel by RT-PCR (RSV, Flu A&B, Covid) Anterior Nasal Swab     Status: None   Collection Time: 06/19/22 12:25 AM   Specimen: Anterior Nasal Swab  Result Value Ref Range Status   SARS Coronavirus 2 by RT PCR  NEGATIVE NEGATIVE Final   Influenza A by PCR NEGATIVE NEGATIVE Final   Influenza B by PCR NEGATIVE NEGATIVE Final    Comment: (NOTE) The Xpert Xpress SARS-CoV-2/FLU/RSV plus assay is intended as an aid in the diagnosis of influenza from Nasopharyngeal swab specimens and should not be used as a sole basis for treatment. Nasal washings and aspirates are unacceptable for Xpert Xpress SARS-CoV-2/FLU/RSV testing.  Fact Sheet for Patients: EntrepreneurPulse.com.au  Fact Sheet for Healthcare Providers: IncredibleEmployment.be  This test is not yet approved or cleared by the [place] FDA and has been authorized for detection and/or diagnosis of SARS-CoV-2 by FDA under an Emergency Use Authorization (EUA). This EUA will remain in effect (meaning this test can be used) for the duration of the COVID-19 declaration under Section 564(b)(1) of the Act, 21 U.S.C. section 360bbb-3(b)(1), unless the authorization is terminated or revoked.     Resp Syncytial Virus by PCR NEGATIVE NEGATIVE Final    Comment: (NOTE) Fact Sheet for Patients: EntrepreneurPulse.com.au  Fact Sheet for Healthcare Providers: IncredibleEmployment.be  This test is not yet approved or cleared by the [place] FDA and has been authorized for detection and/or diagnosis of SARS-CoV-2 by FDA under an Emergency Use Authorization (EUA). This EUA will remain in effect (meaning this test can be used) for the duration of the COVID-19 declaration under Section 564(b)(1) of the Act, 21 U.S.C. section 360bbb-3(b)(1), unless the authorization is terminated or revoked.  Performed at [facility], [place] [address] [place], [place] [address]   Respiratory (~20 pathogens) panel by PCR     Status: None   Collection Time: 06/19/22 12:25 AM   Specimen: Nasopharyngeal Swab; Respiratory  Result Value Ref Range Status   Adenovirus NOT DETECTED NOT  DETECTED Final   Coronavirus 229E NOT DETECTED NOT DETECTED Final    Comment: (NOTE) The Coronavirus on the Respiratory Panel, DOES NOT test for the novel  Coronavirus (2019 nCoV)    Coronavirus HKU1 NOT DETECTED NOT DETECTED Final   Coronavirus NL63 NOT DETECTED NOT DETECTED Final   Coronavirus OC43 NOT DETECTED NOT DETECTED Final   Metapneumovirus NOT DETECTED NOT DETECTED Final   Rhinovirus / Enterovirus NOT DETECTED NOT DETECTED Final   Influenza A NOT DETECTED NOT DETECTED Final   Influenza B NOT DETECTED NOT DETECTED Final   Parainfluenza Virus 1 NOT DETECTED NOT DETECTED Final   Parainfluenza Virus 2 NOT DETECTED NOT DETECTED Final   Parainfluenza Virus  3 NOT DETECTED NOT DETECTED Final   Parainfluenza Virus 4 NOT DETECTED NOT DETECTED Final   Respiratory Syncytial Virus NOT DETECTED NOT DETECTED Final   Bordetella pertussis NOT DETECTED NOT DETECTED Final   Bordetella Parapertussis NOT DETECTED NOT DETECTED Final   Chlamydophila pneumoniae NOT DETECTED NOT DETECTED Final   Mycoplasma pneumoniae NOT DETECTED NOT DETECTED Final    Comment: Performed at [facility], [place] [address], [place], [place] [address]  Culture, blood (Routine X 2) w Reflex to ID Panel     Status: None (Preliminary result)   Collection Time: 06/19/22 11:27 AM   Specimen: BLOOD LEFT HAND  Result Value Ref Range Status   Specimen Description BLOOD LEFT HAND  Final   Special Requests   Final    BOTTLES DRAWN AEROBIC AND ANAEROBIC Blood Culture adequate volume   Culture   Final    NO GROWTH 2 DAYS Performed at [facility], [place] [address] [place], [place] [address]    Report Status PENDING  Incomplete  Culture, blood (Routine X 2) w Reflex to ID Panel     Status: None (Preliminary result)   Collection Time: 06/19/22 11:27 AM   Specimen: BLOOD RIGHT HAND  Result Value Ref Range Status   Specimen Description BLOOD RIGHT HAND  Final   Special Requests   Final    BOTTLES DRAWN AEROBIC AND  ANAEROBIC Blood Culture adequate volume   Culture   Final    NO GROWTH 2 DAYS Performed at [facility], [place] [address], [place], [place] [address]    Report Status PENDING  Incomplete    RADIOLOGY STUDIES/RESULTS: VAS [place] LOWER EXTREMITY VENOUS (DVT)  Result Date: 06/20/2022  Lower Venous DVT Study Patient Name:  Barbara Nelson  Date of Exam:   06/20/2022 Medical Rec #: [id number]          Accession #:    [id number] Date of Birth: 05/27/1940         Patient Gender: F Patient Age:   88 years Exam Location:  [facility] Procedure:      VAS [place] LOWER EXTREMITY VENOUS (DVT) Referring Phys: Loree Fee HARRIS --------------------------------------------------------------------------------  Indications: Pain.  Comparison Study: No prior right LEV on file Performing Technologist: Sharion Dove RVS  Examination Guidelines: A complete evaluation includes B-mode imaging, spectral Doppler, color Doppler, and power Doppler as needed of all accessible portions of each vessel. Bilateral testing is considered an integral part of a complete examination. Limited examinations for reoccurring indications may be performed as noted. The reflux portion of the exam is performed with the patient in reverse Trendelenburg.  +--------+---------------+---------+-----------+----------------+-------------+ RIGHT   CompressibilityPhasicitySpontaneityProperties      Thrombus                                                                 Aging         +--------+---------------+---------+-----------+----------------+-------------+ CFV     Full                               pulsatile  waveforms                     +--------+---------------+---------+-----------+----------------+-------------+ SFJ     Full                                                              +--------+---------------+---------+-----------+----------------+-------------+ FV Prox Full                                                             +--------+---------------+---------+-----------+----------------+-------------+ FV Mid  Full                                                             +--------+---------------+---------+-----------+----------------+-------------+ FV      Full                                                             Distal                                                                   +--------+---------------+---------+-----------+----------------+-------------+ PFV     Full                                                             +--------+---------------+---------+-----------+----------------+-------------+ POP     Full                               pulsatile                                                                waveforms                     +--------+---------------+---------+-----------+----------------+-------------+ PTV     Full                                                             +--------+---------------+---------+-----------+----------------+-------------+  PERO    Full                                                             +--------+---------------+---------+-----------+----------------+-------------+   +----+---------------+---------+-----------+-------------------+--------------+ LEFTCompressibilityPhasicitySpontaneityProperties         Thrombus Aging +----+---------------+---------+-----------+-------------------+--------------+ CFV Full                               pulsatile waveforms               +----+---------------+---------+-----------+-------------------+--------------+    Summary: RIGHT: - There is no evidence of deep vein thrombosis in the lower extremity.  pulsatile waveforms  LEFT: Pulsatile waveforms.  *See table(s) above for measurements and  observations.    Preliminary    DG Chest Port 1V same Day  Result Date: 06/20/2022 CLINICAL DATA:  Shortness of breath. EXAM: PORTABLE CHEST 1 VIEW COMPARISON:  06/18/2022 FINDINGS: Mild cardiac enlargement is unchanged. Persistent small bilateral pleural effusions. Pulmonary vascular congestion. Mild bibasilar atelectasis, unchanged. IMPRESSION: Unchanged mild CHF pattern with bibasilar atelectasis. Electronically Signed   By: Kerby Moors M.D.   On: 06/20/2022 10:03   ECHOCARDIOGRAM COMPLETE  Result Date: 06/19/2022    ECHOCARDIOGRAM REPORT   Patient Name:   AMANIE MCCULLEY Date of Exam: 06/19/2022 Medical Rec #:  [id number]         Height:       65.0 in Accession #:    [id number]        Weight:       217.0 lb Date of Birth:  13-Feb-1941        BSA:          2.048 m Patient Age:    17 years          BP:           117/69 mmHg Patient Gender: F                 HR:           81 bpm. Exam Location:  Inpatient Procedure: 2D Echo and Intracardiac Opacification Agent Indications:    pulmonary embolus: r/o PHTN as cause for new onset hypoxia with                 ambulation and negative CT chest for lungs  History:        Patient has prior history of Echocardiogram examinations, most                 recent 09/29/2019. Pericarditis in 2021, Arrythmias:LBBB; Risk                 Factors:Hypertension.  Sonographer:    Harvie Junior Referring Phys: [phone] JARED M GARDNER  Sonographer Comments: Technically difficult study due to poor echo windows and patient is obese. Image acquisition challenging due to patient body habitus and Image acquisition challenging due to respiratory motion. IMPRESSIONS  1. Left ventricular ejection fraction, by estimation, is 50 to 55%. The left ventricle has low normal function. The left ventricle has no regional wall motion abnormalities. There is mild concentric left ventricular hypertrophy. Left ventricular diastolic parameters are consistent with Grade I diastolic dysfunction (impaired  relaxation).  2. Right ventricular systolic function is normal. The right  ventricular size is normal. There is normal pulmonary artery systolic pressure.  3. Mass along the mitral annulus. No evidence of mitral valve regurgitation.  4. The aortic valve was not well visualized. Aortic valve regurgitation is not visualized.  5. The inferior vena cava is normal in size with greater than 50% respiratory variability, suggesting right atrial pressure of 3 mmHg. 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 FINDINGS  Left Ventricle: Left ventricular ejection fraction, by estimation, is 50 to 55%. The left ventricle has low normal function. The left ventricle has no regional wall motion abnormalities. The left ventricular internal cavity size was normal in size. There is mild concentric left ventricular hypertrophy. Left ventricular diastolic parameters are consistent with Grade I diastolic dysfunction (impaired relaxation). Right Ventricle: The right ventricular size is normal. Right ventricular systolic function is normal. There is normal pulmonary artery systolic pressure. The tricuspid regurgitant velocity is 2.48 m/s, and with an assumed right atrial pressure of 3 mmHg,  the estimated right ventricular systolic pressure is 10.2 mmHg. Left Atrium: Left atrial size was normal in size. Right Atrium: Right atrial size was normal in size. Pericardium: There is no evidence of pericardial effusion. Mitral Valve: Mass along the mitral annulus. No evidence of mitral valve regurgitation. Tricuspid Valve: Tricuspid valve regurgitation is mild. Aortic Valve: The aortic valve was not well visualized. Aortic valve regurgitation is not visualized. Aortic valve mean gradient measures 6.0 mmHg. Aortic valve peak gradient measures 10.0 mmHg. Aortic valve area, by VTI measures 2.10 cm. Pulmonic Valve: Pulmonic valve regurgitation is not  visualized. Venous: The inferior vena cava is normal in size with greater than 50% respiratory variability, suggesting right atrial pressure of 3 mmHg. IAS/Shunts: The interatrial septum was not well visualized.  LEFT VENTRICLE PLAX 2D LVIDd:         3.40 cm     Diastology LVIDs:         2.40 cm     LV e' medial:    5.66 cm/s LV PW:         1.10 cm     LV E/e' medial:  13.9 LV IVS:        1.10 cm     LV e' lateral:   5.22 cm/s LVOT diam:     1.80 cm     LV E/e' lateral: 15.1 LV SV:         53 LV SV Index:   26 LVOT Area:     2.54 cm  LV Volumes (MOD) LV vol d, MOD A2C: 52.8 ml LV vol d, MOD A4C: 68.2 ml LV vol s, MOD A2C: 29.1 ml LV vol s, MOD A4C: 40.8 ml LV SV MOD A2C:     23.7 ml LV SV MOD A4C:     68.2 ml LV SV MOD BP:      25.2 ml RIGHT VENTRICLE RV S prime:     17.30 cm/s TAPSE (M-mode): 1.8 cm LEFT ATRIUM             Index LA diam:        3.60 cm 1.76 cm/m LA Vol (A2C):   38.6 ml 18.85 ml/m LA Vol (A4C):   62.7 ml 30.62 ml/m LA Biplane Vol: 48.9 ml 23.88 ml/m  AORTIC VALVE                     PULMONIC VALVE AV Area (Vmax):    2.01  cm      PV Vmax:       0.89 m/s AV Area (Vmean):   1.79 cm      PV Peak grad:  3.2 mmHg AV Area (VTI):     2.10 cm AV Vmax:           158.00 cm/s AV Vmean:          112.000 cm/s AV VTI:            0.251 m AV Peak Grad:      10.0 mmHg AV Mean Grad:      6.0 mmHg LVOT Vmax:         125.00 cm/s LVOT Vmean:        78.900 cm/s LVOT VTI:          0.207 m LVOT/AV VTI ratio: 0.82  AORTA Ao Root diam: 2.80 cm Ao Asc diam:  2.80 cm MITRAL VALVE                TRICUSPID VALVE MV Area (PHT): 3.78 cm     TR Peak grad:   24.6 mmHg MV Decel Time: 201 msec     TR Vmax:        248.00 cm/s MV E velocity: 78.95 cm/s MV A velocity: 120.50 cm/s  SHUNTS MV E/A ratio:  0.66         Systemic VTI:  0.21 m                             Systemic Diam: 1.80 cm Phineas Inches Electronically signed by Phineas Inches Signature Date/Time: 06/19/2022/11:09:53 AM    Final      LOS: 2 days   Oren Binet,  MD  [facility]    To contact the attending provider between 7A-7P or the covering provider during after hours 7P-7A, please log into the web site [email or web address] and access using universal [place] password for that web site. If you do not have the password, please call the hospital operator.  06/21/2022, 10:24 AM

## 2022-06-21 NOTE — Plan of Care (Signed)

## 2022-06-21 NOTE — Progress Notes (Signed)
06/21/2022   I have seen and evaluated the patient for hypoxemia.  S:  Looks and feels a bit better today.  Working on mobility and IS.  O: Blood pressure 106/61, pulse 91, temperature 98.2 F (36.8 C), temperature source Oral, resp. rate 18, height 5\' 5"  (1.651 m), weight 98.4 kg, SpO2 93 %.  No distress Edema improved Lung sounds clear  A:  Multifactorial resp failure- see discussion yesterday  P:  - Push diuresis, nutrition, and mobility - Wean O2 for sats >90% - Discussed with primary, will be available PRN given improvement  Erskine Emery MD [facility] Prefer epic messenger for cross cover needs If after hours, please call E-link

## 2022-06-21 NOTE — Progress Notes (Addendum)
Physical Therapy Treatment Patient Details Name: Barbara Nelson MRN: [id number] DOB: 01-10-1941 Today's Date: 06/21/2022   History of Present Illness 82 y.o. female presents to [facility] hospital on 06/18/2022 with productive cough and SOB. Pt found to be hyponatremic. PMH includes HTN, PAF, mycetoma s/p LML lobectomy of lung, Pericardectomy for tamponade from viral pericarditis.    PT Comments    Patient making steady progress with OOB to chair mobility. Continues to desaturate during activity (87% on 2L) and with dizziness with standing (BP stable), therefore unable to progress to ambulation. May need to consider ambulating with chair follow for safety. Patient/family appreciated LE exercises to "limber up" prior to attempt to get OOB.      Recommendations for follow up therapy are one component of a multi-disciplinary discharge planning process, led by the attending physician.  Recommendations may be updated based on patient status, additional functional criteria and insurance authorization.  Follow Up Recommendations  Skilled nursing-short term rehab (<3 hours/day) (Pt and family may elect to go home with HHPT if able to provide assistance for patient.) Can patient physically be transported by private vehicle: Yes   Assistance Recommended at Discharge Intermittent Supervision/Assistance  Patient can return home with the following A little help with walking and/or transfers;A lot of help with bathing/dressing/bathroom;Assistance with cooking/housework;Assist for transportation;Help with stairs or ramp for entrance   Equipment Recommendations  BSC/3in1    Recommendations for Other Services       Precautions / Restrictions Precautions Precautions: Fall Restrictions Weight Bearing Restrictions: No     Mobility  Bed Mobility Overal bed mobility: Needs Assistance Bed Mobility: Rolling, Sidelying to Sit Rolling: Min assist Sidelying to sit: Min assist, HOB elevated       General  bed mobility comments: Incr time and vc for sequencing; assist to fully roll onto her side and then to help raise torso to sitting    Transfers Overall transfer level: Needs assistance Equipment used: Rolling walker (2 wheels) Transfers: Sit to/from Stand, Bed to chair/wheelchair/BSC Sit to Stand: Min assist   Step pivot transfers: Min assist       General transfer comment: Increased time with rocking momentum to achieve standing from EOB.  Pt with desat in standing on 2L to 87%, returned greater than 90% with seated rest break.    Ambulation/Gait               General Gait Details: only able to perform steps from bed to recliner due to sats and fatigue.   Stairs             Wheelchair Mobility    Modified Rankin (Stroke Patients Only)       Balance Overall balance assessment: Needs assistance Sitting-balance support: No upper extremity supported, Feet supported Sitting balance-Leahy Scale: Fair     Standing balance support: Bilateral upper extremity supported, Reliant on assistive device for balance Standing balance-Leahy Scale: Poor Standing balance comment: minG - min A with fatigue                            Cognition Arousal/Alertness: Awake/alert Behavior During Therapy: Flat affect Overall Cognitive Status: Impaired/Different from baseline Area of Impairment: Problem solving, Following commands                       Following Commands: Follows one step commands consistently, Follows one step commands with increased time     Problem Solving: Slow processing,  Requires verbal cues, Difficulty sequencing General Comments: slow processing, but following all commands with increased time. Required step by step cues during transfers and bed mobility        Exercises General Exercises - Lower Extremity Ankle Circles/Pumps: AROM, Both, 10 reps Heel Slides: AAROM, Strengthening, Both, 5 reps (assisted flexion; resisted  extension) Hip ABduction/ADduction: AAROM, Both, 5 reps, Supine    General Comments        Pertinent Vitals/Pain Pain Assessment Pain Assessment: No/denies pain    Home Living                          Prior Function            PT Goals (current goals can now be found in the care plan section) Acute Rehab PT Goals Patient Stated Goal: to improve strength, reduced assistance requirements when mobilizing Time For Goal Achievement: 07/03/22 Potential to Achieve Goals: Good Progress towards PT goals: Progressing toward goals    Frequency    Min 3X/week      PT Plan Current plan remains appropriate    Co-evaluation              AM-PAC PT "6 Clicks" Mobility   Outcome Measure  Help needed turning from your back to your side while in a flat bed without using bedrails?: A Little Help needed moving from lying on your back to sitting on the side of a flat bed without using bedrails?: A Little Help needed moving to and from a bed to a chair (including a wheelchair)?: A Little Help needed standing up from a chair using your arms (e.g., wheelchair or bedside chair)?: A Little Help needed to walk in hospital room?: Total Help needed climbing 3-5 steps with a railing? : Total 6 Click Score: 14    End of Session Equipment Utilized During Treatment: Oxygen;Gait belt Activity Tolerance: Patient limited by fatigue Patient left: in chair;with call bell/phone within reach;with family/visitor present Nurse Communication: Mobility status PT Visit Diagnosis: Other abnormalities of gait and mobility (R26.89);Muscle weakness (generalized) (M62.81)     Time: 3428-7681 PT Time Calculation (min) (ACUTE ONLY): 28 min  Charges:  $Gait Training: 8-22 mins $Therapeutic Exercise: 8-22 mins                      Arby Barrette, PT [place]  Office [phone]    Rexanne Mano 06/21/2022, 3:51 PM

## 2022-06-22 DIAGNOSIS — J9601 Acute respiratory failure with hypoxia: Secondary | ICD-10-CM | POA: Diagnosis not present

## 2022-06-22 DIAGNOSIS — I10 Essential (primary) hypertension: Secondary | ICD-10-CM | POA: Diagnosis not present

## 2022-06-22 DIAGNOSIS — E871 Hypo-osmolality and hyponatremia: Secondary | ICD-10-CM | POA: Diagnosis not present

## 2022-06-22 LAB — BASIC METABOLIC PANEL
Anion gap: 9 (ref 5–15)
BUN: 10 mg/dL (ref 8–23)
CO2: 30 mmol/L (ref 22–32)
Calcium: 8.7 mg/dL — ABNORMAL LOW (ref 8.9–10.3)
Chloride: 89 mmol/L — ABNORMAL LOW (ref 98–111)
Creatinine, Ser: 0.81 mg/dL (ref 0.44–1.00)
GFR, Estimated: >60 mL/min (ref >60)
Glucose, Bld: 112 mg/dL — ABNORMAL HIGH (ref 70–99)
Potassium: 3.4 mmol/L — ABNORMAL LOW (ref 3.5–5.1)
Sodium: 128 mmol/L — ABNORMAL LOW (ref 135–145)

## 2022-06-22 LAB — GLUCOSE, CAPILLARY
Glucose-Capillary: 139 mg/dL — ABNORMAL HIGH (ref 70–99)
Glucose-Capillary: 127 mg/dL — ABNORMAL HIGH (ref 70–99)
Glucose-Capillary: 111 mg/dL — ABNORMAL HIGH (ref 70–99)

## 2022-06-22 MED ORDER — POLYETHYLENE GLYCOL 3350 17 G PO PACK
34.0000 g | PACK | Freq: Every day | ORAL | Status: DC | PRN
  Administered 2022-06-24: 34 g via ORAL
  Filled 2022-06-22: qty 2

## 2022-06-22 MED ORDER — POTASSIUM CHLORIDE 20 MEQ PO PACK
40.0000 meq | PACK | Freq: Once | ORAL | Status: AC
  Administered 2022-06-22: 40 meq via ORAL
  Filled 2022-06-22: qty 2

## 2022-06-22 MED ORDER — METOPROLOL SUCCINATE ER 25 MG PO TB24
12.5000 mg | ORAL_TABLET | Freq: Every day | ORAL | Status: DC
  Administered 2022-06-23 – 2022-06-26 (×4): 12.5 mg via ORAL
  Filled 2022-06-22 (×4): qty 1

## 2022-06-22 MED ORDER — ALBUMIN HUMAN 25 % IV SOLN
50.0000 g | Freq: Every day | INTRAVENOUS | Status: DC
  Administered 2022-06-22 (×2): 50 g via INTRAVENOUS
  Filled 2022-06-22 (×2): qty 200

## 2022-06-22 MED ORDER — FUROSEMIDE 10 MG/ML IJ SOLN
40.0000 mg | Freq: Every day | INTRAMUSCULAR | Status: DC
  Administered 2022-06-22: 40 mg via INTRAVENOUS
  Filled 2022-06-22: qty 4

## 2022-06-22 NOTE — Progress Notes (Addendum)
PROGRESS NOTE        PATIENT DETAILS Name: Barbara Nelson Age: 82 y.o. Sex: female Date of Birth: 07/07/1940 Admit Date: 06/18/2022 Admitting Physician Karmen Bongo, MD [id number], Abigail Butts, MD  Brief Summary: Patient is a 82 y.o.  female with history of HTN, prior pericardiectomy for tamponade from viral pericarditis, prior history of pulmonary blastomycosis-s/p right lower lobectomy 2014--presented with 2-3-day history of exertional dyspnea-found to have mild acute hypoxic respiratory failure requiring around 2 L of oxygen to maintain O2 saturation above 88%.  Significant events: 2/1>> admit to [facility]-hypoxia-?suspected CHF exacerbation.  Admit to [facility].  Significant studies: 2/1>> CT angio chest: No PE, minimal right pleural effusion, bibasilar subsegmental atelectasis 2/2>> echo: EF 50-55%, mass along the mitral annulus.  Grade 1 diastolic dysfunction  Significant microbiology data: 2/2>> COVID/influenza/RSV PCR: Negative 2/2>> respiratory virus panel: Negative 2/2>>blood culture:no growth  Procedures: None  Consults: None  Subjective: No major issues overnight-some coughing spell continues-stable on 2 L of oxygen Looks frail/weak-family discussion whether to pursue home health versus SNF rehab..  Objective: Vitals: Blood pressure 106/63, pulse 83, temperature 97.9 F (36.6 C), temperature source Oral, resp. rate (!) 24, height 5\' 5"  (1.651 m), weight 98.4 kg, SpO2 98 %.   Exam: Gen Exam:Alert awake-not in any distress HEENT:atraumatic, normocephalic Chest: B/L clear to auscultation anteriorly CVS:S1S2 regular Abdomen:soft non tender, non distended Extremities: Trace edema Neurology: Non focal Skin: no rash  Pertinent Labs/Radiology:    Latest Ref Rng & Units 06/19/2022    6:57 AM 06/18/2022    8:03 AM 05/13/2022   12:13 AM  CBC  WBC 4.0 - 10.5 K/uL 7.7  6.6  6.0   Hemoglobin 12.0 - 15.0 g/dL 12.4  13.0  14.1   Hematocrit 36.0 - 46.0  % 36.2  38.3  42.1   Platelets 150 - 400 K/uL 155  171  128     Lab Results  Component Value Date   NA 128 (L) 06/22/2022   K 3.4 (L) 06/22/2022   CL 89 (L) 06/22/2022   CO2 30 06/22/2022     Assessment/Plan: Acute hypoxic respiratory failure Unclear etiology but suspicion for PNA/HFpEF playing a role  Overall improved-stabilizing-still requiring around 2 L of oxygen Continue Levaquin-x 5 days total Continue Lasix to ensure negative balance Continue to encourage pulmonary tolerating/incentive spirometry/mobilization Appreciate PCCM/cards input.  Hyponatremia Multifactorial-due to HCTZ/volume overload from perhaps underlying CHF Continue IV furosemide  Hypokalemia Replete/recheck  HFpEF exacerbation See above  CAP See above Continue levofloxacin.  Mass along the mitral valve annulus Seen incidentally on echo Appreciate cardiology input-recommendations are to repeat echo in a few weeks.  Hypoalbuminemia Significant drop in albumin levels Unclear if this is from acute illness or from proteinuria (history of DM)  History of thoracoscopy with right lung lobectomy 2014 History of pulmonary blastomycosis  History of pericardial effusion-requiring pericardiectomy  HTN BP soft Continue metoprolol.  DM-2 (A1c 6.9 on 2/2) Metformin on hold Monitor CBGs on SSI  Recent Labs    06/21/22 1253 06/21/22 1717 06/21/22 2040  GLUCAP 129* 151* 130*    Debility/deconditioning Significant decline in function since late December when patient had a mechanical fall Poor oral intake/appetite-very frail Encouraged ambulation with therapy-encouraged oral intake-family to decide whether to pursue SNF versus home health services.  Obesity: Estimated body mass index is 36.11 kg/m as calculated from the following:  Height as of this encounter: 5\' 5"  (1.651 m).   Weight as of this encounter: 98.4 kg.   Code status:   Code Status: Full Code   DVT Prophylaxis: enoxaparin  (LOVENOX) injection 40 mg Start: 06/19/22 1000   Family Communication: Daughter at bedside   Disposition Plan: Status is: Observation The patient will require care spanning > 2 midnights and should be moved to inpatient because: Severity of illness   Planned Discharge Destination:Home vs SNF   Diet: Diet Order             Diet heart healthy/carb modified Room service appropriate? Yes; Fluid consistency: Thin; Fluid restriction: 1500 mL Fluid  Diet effective now                     Antimicrobial agents: Anti-infectives (From admission, onward)    Start     Dose/Rate Route Frequency Ordered Stop   06/20/22 1030  levofloxacin (LEVAQUIN) tablet 750 mg        750 mg Oral Daily 06/20/22 0932 06/25/22 0959   06/19/22 1200  doxycycline (VIBRA-TABS) tablet 100 mg  Status:  Discontinued        100 mg Oral Every 12 hours 06/19/22 1110 06/20/22 0917        MEDICATIONS: Scheduled Meds:  diclofenac Sodium  2 g Topical QID   enoxaparin (LOVENOX) injection  40 mg Subcutaneous Q24H   feeding supplement  237 mL Oral TID BM   furosemide  40 mg Intravenous Daily   insulin aspart  0-9 Units Subcutaneous TID WC   levofloxacin  750 mg Oral Daily   [START ON 06/23/2022] metoprolol succinate  12.5 mg Oral Daily   polyvinyl alcohol  1 drop Both Eyes TID   pregabalin  25 mg Oral QHS   Continuous Infusions:  albumin human 50 g (06/22/22 0735)   PRN Meds:.acetaminophen **OR** acetaminophen, albuterol, ondansetron **OR** ondansetron (ZOFRAN) IV, polyethylene glycol   I have personally reviewed following labs and imaging studies  LABORATORY DATA: CBC: Recent Labs  Lab 06/18/22 0803 06/19/22 0657  WBC 6.6 7.7  NEUTROABS 4.4  --   HGB 13.0 12.4  HCT 38.3 36.2  MCV 82.2 82.8  PLT 171 155     Basic Metabolic Panel: Recent Labs  Lab 06/18/22 0803 06/19/22 0657 06/20/22 0315 06/21/22 0721 06/22/22 0211  NA 127* 131* 130* 127* 128*  K 3.3* 3.6 3.8 3.6 3.4*  CL 91* 95* 93*  90* 89*  CO2 28 28 27 27 30   GLUCOSE 133* 109* 93 117* 112*  BUN 8 7* 8 10 10   CREATININE 0.71 0.70 0.77 0.78 0.81  CALCIUM 8.1* 8.3* 8.3* 8.4* 8.7*  MG 1.8 1.9 1.8 1.6*  --      GFR: Estimated Creatinine Clearance: 62.2 mL/min (by C-G formula based on SCr of 0.81 mg/dL).  Liver Function Tests: Recent Labs  Lab 06/19/22 0657 06/20/22 0315  AST 58* 56*  ALT 34 36  ALKPHOS 76 66  BILITOT 0.9 0.9  PROT 5.7* 5.8*  ALBUMIN 1.7* 1.7*    No results for input(s): "LIPASE", "AMYLASE" in the last 168 hours. No results for input(s): "AMMONIA" in the last 168 hours.  Coagulation Profile: No results for input(s): "INR", "PROTIME" in the last 168 hours.  Cardiac Enzymes: No results for input(s): "CKTOTAL", "CKMB", "CKMBINDEX", "TROPONINI" in the last 168 hours.  BNP (last 3 results) No results for input(s): "PROBNP" in the last 8760 hours.  Lipid Profile: No results for input(s): "CHOL", "  HDL", "LDLCALC", "TRIG", "CHOLHDL", "LDLDIRECT" in the last 72 hours.  Thyroid Function Tests: No results for input(s): "TSH", "T4TOTAL", "FREET4", "T3FREE", "THYROIDAB" in the last 72 hours.  Anemia Panel: No results for input(s): "VITAMINB12", "FOLATE", "FERRITIN", "TIBC", "IRON", "RETICCTPCT" in the last 72 hours.  Urine analysis:    Component Value Date/Time   COLORURINE YELLOW 06/19/2022 1456   APPEARANCEUR HAZY (A) 06/19/2022 1456   LABSPEC 1.012 06/19/2022 1456   PHURINE 7.0 06/19/2022 1456   GLUCOSEU NEGATIVE 06/19/2022 1456   HGBUR NEGATIVE 06/19/2022 1456   BILIRUBINUR NEGATIVE 06/19/2022 1456   KETONESUR NEGATIVE 06/19/2022 1456   PROTEINUR 100 (A) 06/19/2022 1456   UROBILINOGEN 0.2 09/15/2011 0530   NITRITE NEGATIVE 06/19/2022 1456   LEUKOCYTESUR NEGATIVE 06/19/2022 1456    Sepsis Labs: Lactic Acid, Venous No results found for: "LATICACIDVEN"  MICROBIOLOGY: Recent Results (from the past 240 hour(s))  Resp panel by RT-PCR (RSV, Flu A&B, Covid) Anterior Nasal Swab      Status: None   Collection Time: 06/19/22 12:25 AM   Specimen: Anterior Nasal Swab  Result Value Ref Range Status   SARS Coronavirus 2 by RT PCR NEGATIVE NEGATIVE Final   Influenza A by PCR NEGATIVE NEGATIVE Final   Influenza B by PCR NEGATIVE NEGATIVE Final    Comment: (NOTE) The Xpert Xpress SARS-CoV-2/FLU/RSV plus assay is intended as an aid in the diagnosis of influenza from Nasopharyngeal swab specimens and should not be used as a sole basis for treatment. Nasal washings and aspirates are unacceptable for Xpert Xpress SARS-CoV-2/FLU/RSV testing.  Fact Sheet for Patients: EntrepreneurPulse.com.au  Fact Sheet for Healthcare Providers: IncredibleEmployment.be  This test is not yet approved or cleared by the [place] FDA and has been authorized for detection and/or diagnosis of SARS-CoV-2 by FDA under an Emergency Use Authorization (EUA). This EUA will remain in effect (meaning this test can be used) for the duration of the COVID-19 declaration under Section 564(b)(1) of the Act, 21 U.S.C. section 360bbb-3(b)(1), unless the authorization is terminated or revoked.     Resp Syncytial Virus by PCR NEGATIVE NEGATIVE Final    Comment: (NOTE) Fact Sheet for Patients: EntrepreneurPulse.com.au  Fact Sheet for Healthcare Providers: IncredibleEmployment.be  This test is not yet approved or cleared by the [place] FDA and has been authorized for detection and/or diagnosis of SARS-CoV-2 by FDA under an Emergency Use Authorization (EUA). This EUA will remain in effect (meaning this test can be used) for the duration of the COVID-19 declaration under Section 564(b)(1) of the Act, 21 U.S.C. section 360bbb-3(b)(1), unless the authorization is terminated or revoked.  Performed at [facility], [place] [address] [place], [place] [address]   Respiratory (~20 pathogens) panel by PCR     Status: None    Collection Time: 06/19/22 12:25 AM   Specimen: Nasopharyngeal Swab; Respiratory  Result Value Ref Range Status   Adenovirus NOT DETECTED NOT DETECTED Final   Coronavirus 229E NOT DETECTED NOT DETECTED Final    Comment: (NOTE) The Coronavirus on the Respiratory Panel, DOES NOT test for the novel  Coronavirus (2019 nCoV)    Coronavirus HKU1 NOT DETECTED NOT DETECTED Final   Coronavirus NL63 NOT DETECTED NOT DETECTED Final   Coronavirus OC43 NOT DETECTED NOT DETECTED Final   Metapneumovirus NOT DETECTED NOT DETECTED Final   Rhinovirus / Enterovirus NOT DETECTED NOT DETECTED Final   Influenza A NOT DETECTED NOT DETECTED Final   Influenza B NOT DETECTED NOT DETECTED Final   Parainfluenza Virus 1 NOT DETECTED  NOT DETECTED Final   Parainfluenza Virus 2 NOT DETECTED NOT DETECTED Final   Parainfluenza Virus 3 NOT DETECTED NOT DETECTED Final   Parainfluenza Virus 4 NOT DETECTED NOT DETECTED Final   Respiratory Syncytial Virus NOT DETECTED NOT DETECTED Final   Bordetella pertussis NOT DETECTED NOT DETECTED Final   Bordetella Parapertussis NOT DETECTED NOT DETECTED Final   Chlamydophila pneumoniae NOT DETECTED NOT DETECTED Final   Mycoplasma pneumoniae NOT DETECTED NOT DETECTED Final    Comment: Performed at [facility], [place] [address], [place], [place] [address]  Culture, blood (Routine X 2) w Reflex to ID Panel     Status: None (Preliminary result)   Collection Time: 06/19/22 11:27 AM   Specimen: BLOOD LEFT HAND  Result Value Ref Range Status   Specimen Description BLOOD LEFT HAND  Final   Special Requests   Final    BOTTLES DRAWN AEROBIC AND ANAEROBIC Blood Culture adequate volume   Culture   Final    NO GROWTH 3 DAYS Performed at [facility], [place] [address], [place], [place] [address]    Report Status PENDING  Incomplete  Culture, blood (Routine X 2) w Reflex to ID Panel     Status: None (Preliminary result)   Collection Time: 06/19/22 11:27 AM   Specimen: BLOOD RIGHT  HAND  Result Value Ref Range Status   Specimen Description BLOOD RIGHT HAND  Final   Special Requests   Final    BOTTLES DRAWN AEROBIC AND ANAEROBIC Blood Culture adequate volume   Culture   Final    NO GROWTH 3 DAYS Performed at [facility], [place] [address] [place],  [address]    Report Status PENDING  Incomplete    RADIOLOGY STUDIES/RESULTS: VAS [place] LOWER EXTREMITY VENOUS (DVT)  Result Date: 06/20/2022  Lower Venous DVT Study Patient Name:  MONEISHA VOSLER  Date of Exam:   06/20/2022 Medical Rec #: [id number]          Accession #:    [id number] Date of Birth: [birth year]         Patient Gender: F Patient Age:   72 years Exam Location:  [facility] Procedure:      VAS [place] LOWER EXTREMITY VENOUS (DVT) Referring Phys: Loree Fee HARRIS --------------------------------------------------------------------------------  Indications: Pain.  Comparison Study: No prior right LEV on file Performing Technologist: Sharion Dove RVS  Examination Guidelines: A complete evaluation includes B-mode imaging, spectral Doppler, color Doppler, and power Doppler as needed of all accessible portions of each vessel. Bilateral testing is considered an integral part of a complete examination. Limited examinations for reoccurring indications may be performed as noted. The reflux portion of the exam is performed with the patient in reverse Trendelenburg.  +--------+---------------+---------+-----------+----------------+-------------+ RIGHT   CompressibilityPhasicitySpontaneityProperties      Thrombus                                                                 Aging         +--------+---------------+---------+-----------+----------------+-------------+ CFV     Full                               pulsatile  waveforms                     +--------+---------------+---------+-----------+----------------+-------------+ SFJ      Full                                                             +--------+---------------+---------+-----------+----------------+-------------+ FV Prox Full                                                             +--------+---------------+---------+-----------+----------------+-------------+ FV Mid  Full                                                             +--------+---------------+---------+-----------+----------------+-------------+ FV      Full                                                             Distal                                                                   +--------+---------------+---------+-----------+----------------+-------------+ PFV     Full                                                             +--------+---------------+---------+-----------+----------------+-------------+ POP     Full                               pulsatile                                                                waveforms                     +--------+---------------+---------+-----------+----------------+-------------+ PTV     Full                                                             +--------+---------------+---------+-----------+----------------+-------------+  PERO    Full                                                             +--------+---------------+---------+-----------+----------------+-------------+   +----+---------------+---------+-----------+-------------------+--------------+ LEFTCompressibilityPhasicitySpontaneityProperties         Thrombus Aging +----+---------------+---------+-----------+-------------------+--------------+ CFV Full                               pulsatile waveforms               +----+---------------+---------+-----------+-------------------+--------------+    Summary: RIGHT: - There is no evidence of deep vein thrombosis in the lower extremity.  pulsatile waveforms   LEFT: Pulsatile waveforms.  *See table(s) above for measurements and observations.    Preliminary    DG Chest Port 1V same Day  Result Date: 06/20/2022 CLINICAL DATA:  Shortness of breath. EXAM: PORTABLE CHEST 1 VIEW COMPARISON:  06/18/2022 FINDINGS: Mild cardiac enlargement is unchanged. Persistent small bilateral pleural effusions. Pulmonary vascular congestion. Mild bibasilar atelectasis, unchanged. IMPRESSION: Unchanged mild CHF pattern with bibasilar atelectasis. Electronically Signed   By: Kerby Moors M.D.   On: 06/20/2022 10:03     LOS: 3 days   Oren Binet, MD  [facility]    To contact the attending provider between 7A-7P or the covering provider during after hours 7P-7A, please log into the web site [email or web address] and access using universal [place] password for that web site. If you do not have the password, please call the hospital operator.  06/22/2022, 9:10 AM

## 2022-06-22 NOTE — Progress Notes (Signed)
Physical Therapy Treatment Patient Details Name: Barbara Nelson MRN: [id number] DOB: [birth year] Today's Date: 06/22/2022   History of Present Illness 82 y.o. female presents to [facility] hospital on 06/18/2022 with productive cough and SOB. Pt found to be hyponatremic. PMH includes HTN, PAF, mycetoma s/p LML lobectomy of lung, Pericardectomy for tamponade from viral pericarditis.    PT Comments    Pt tolerates treatment well, returning to ambulation despite continued reports of dizziness in standing. Pt is able to ambulate out of room although with considerable effort. PT provides education on transfer technique, HEP, and use of towel roll to improve lumbar lordosis when resting. Pt will benefit from frequent mobilization in an effort to improve endurance and reduce falls risk. PT continues to recommend SNF placement at this time.   Recommendations for follow up therapy are one component of a multi-disciplinary discharge planning process, led by the attending physician.  Recommendations may be updated based on patient status, additional functional criteria and insurance authorization.  Follow Up Recommendations  Skilled nursing-short term rehab (<3 hours/day) (daughters still debating discharge home with HHPT) Can patient physically be transported by private vehicle: Yes   Assistance Recommended at Discharge Intermittent Supervision/Assistance  Patient can return home with the following A little help with walking and/or transfers;A little help with bathing/dressing/bathroom;Assistance with cooking/housework;Assist for transportation;Help with stairs or ramp for entrance   Equipment Recommendations  BSC/3in1    Recommendations for Other Services       Precautions / Restrictions Precautions Precautions: Fall Restrictions Weight Bearing Restrictions: No     Mobility  Bed Mobility Overal bed mobility: Needs Assistance Bed Mobility: Supine to Sit     Supine to sit: Min assist, HOB  elevated     General bed mobility comments: increased time, use of rails, verbal cues for technique    Transfers Overall transfer level: Needs assistance Equipment used: Rolling walker (2 wheels) Transfers: Sit to/from Stand Sit to Stand: Min assist   Step pivot transfers: Min assist       General transfer comment: verbal cues to increase trunk flexion, scoot to edge of seat, push from mattress/recliner rathe than 2 hands on walker. Tactile cues to increased rock for momentum    Ambulation/Gait Ambulation/Gait assistance: Min guard Gait Distance (Feet): 40 Feet Assistive device: Rolling walker (2 wheels) Gait Pattern/deviations: Step-to pattern Gait velocity: reduced Gait velocity interpretation: <1.31 ft/sec, indicative of household ambulator   General Gait Details: slowed step-to gait, reduced foot clearance bilaterally   Stairs             Wheelchair Mobility    Modified Rankin (Stroke Patients Only)       Balance Overall balance assessment: Needs assistance Sitting-balance support: No upper extremity supported, Feet supported Sitting balance-Leahy Scale: Fair     Standing balance support: Bilateral upper extremity supported, Reliant on assistive device for balance Standing balance-Leahy Scale: Poor Standing balance comment: minG with BUE support of RW                            Cognition Arousal/Alertness: Awake/alert Behavior During Therapy: Flat affect Overall Cognitive Status: Impaired/Different from baseline Area of Impairment: Problem solving                             Problem Solving: Slow processing          Exercises General Exercises - Lower Extremity Ankle Circles/Pumps: AROM,  Both, 10 reps Gluteal Sets: AROM, Both, 5 reps Short Arc Quad: AROM, Both, 10 reps Heel Slides: AAROM, Both, 5 reps Hip ABduction/ADduction: AROM, Both, 5 reps    General Comments General comments (skin integrity, edema, etc.): pt  on 2L[place] at rest, brief periods of desaturation when mobilizing on 2L [place] but pt recovers quickly when seated and resting      Pertinent Vitals/Pain Pain Assessment Pain Assessment: Faces Faces Pain Scale: Hurts little more Pain Location: back Pain Descriptors / Indicators: Sore Pain Intervention(s): Monitored during session    Home Living                          Prior Function            PT Goals (current goals can now be found in the care plan section) Acute Rehab PT Goals Patient Stated Goal: to improve strength, reduced assistance requirements when mobilizing Progress towards PT goals: Progressing toward goals    Frequency    Min 3X/week      PT Plan Current plan remains appropriate    Co-evaluation              AM-PAC PT "6 Clicks" Mobility   Outcome Measure  Help needed turning from your back to your side while in a flat bed without using bedrails?: A Little Help needed moving from lying on your back to sitting on the side of a flat bed without using bedrails?: A Little Help needed moving to and from a bed to a chair (including a wheelchair)?: A Little Help needed standing up from a chair using your arms (e.g., wheelchair or bedside chair)?: A Little Help needed to walk in hospital room?: A Little Help needed climbing 3-5 steps with a railing? : Total 6 Click Score: 16    End of Session Equipment Utilized During Treatment: Oxygen;Gait belt Activity Tolerance: Patient limited by fatigue Patient left: in chair;with call bell/phone within reach;with family/visitor present Nurse Communication: Mobility status PT Visit Diagnosis: Other abnormalities of gait and mobility (R26.89);Muscle weakness (generalized) (M62.81)     Time: 8916-9450 PT Time Calculation (min) (ACUTE ONLY): 46 min  Charges:  $Gait Training: 8-22 mins $Therapeutic Exercise: 8-22 mins $Therapeutic Activity: 8-22 mins                     Zenaida Niece, PT, DPT Acute  Rehabilitation Office [place] 06/22/2022, 9:19 AM

## 2022-06-23 DIAGNOSIS — I10 Essential (primary) hypertension: Secondary | ICD-10-CM | POA: Diagnosis not present

## 2022-06-23 DIAGNOSIS — J9601 Acute respiratory failure with hypoxia: Secondary | ICD-10-CM | POA: Diagnosis not present

## 2022-06-23 DIAGNOSIS — E871 Hypo-osmolality and hyponatremia: Secondary | ICD-10-CM | POA: Diagnosis not present

## 2022-06-23 LAB — GLUCOSE, CAPILLARY
Glucose-Capillary: 134 mg/dL — ABNORMAL HIGH (ref 70–99)
Glucose-Capillary: 135 mg/dL — ABNORMAL HIGH (ref 70–99)
Glucose-Capillary: 127 mg/dL — ABNORMAL HIGH (ref 70–99)
Glucose-Capillary: 117 mg/dL — ABNORMAL HIGH (ref 70–99)
Glucose-Capillary: 123 mg/dL — ABNORMAL HIGH (ref 70–99)

## 2022-06-23 LAB — BASIC METABOLIC PANEL
Anion gap: 9 (ref 5–15)
BUN: 9 mg/dL (ref 8–23)
CO2: 30 mmol/L (ref 22–32)
Calcium: 9.2 mg/dL (ref 8.9–10.3)
Chloride: 91 mmol/L — ABNORMAL LOW (ref 98–111)
Creatinine, Ser: 0.79 mg/dL (ref 0.44–1.00)
GFR, Estimated: >60 mL/min (ref >60)
Glucose, Bld: 119 mg/dL — ABNORMAL HIGH (ref 70–99)
Potassium: 3.5 mmol/L (ref 3.5–5.1)
Sodium: 130 mmol/L — ABNORMAL LOW (ref 135–145)

## 2022-06-23 LAB — MAGNESIUM: Magnesium: 1.8 mg/dL (ref 1.7–2.4)

## 2022-06-23 MED ORDER — POTASSIUM CHLORIDE CRYS ER 20 MEQ PO TBCR: 20.0000 meq | EXTENDED_RELEASE_TABLET | Freq: Once | ORAL | Status: DC

## 2022-06-23 MED ORDER — FUROSEMIDE 40 MG PO TABS
40.0000 mg | ORAL_TABLET | Freq: Once | ORAL | Status: AC
  Administered 2022-06-23: 40 mg via ORAL
  Filled 2022-06-23: qty 1

## 2022-06-23 MED ORDER — MAGNESIUM SULFATE 2 GM/50ML IV SOLN
2.0000 g | Freq: Once | INTRAVENOUS | Status: AC
  Administered 2022-06-23: 2 g via INTRAVENOUS
  Filled 2022-06-23: qty 50

## 2022-06-23 MED ORDER — POTASSIUM CHLORIDE 20 MEQ PO PACK: 40.0000 meq | PACK | Freq: Once | ORAL | Status: DC

## 2022-06-23 MED ORDER — POTASSIUM CHLORIDE 20 MEQ PO PACK
40.0000 meq | PACK | ORAL | Status: AC
  Administered 2022-06-23 (×2): 40 meq via ORAL
  Filled 2022-06-23 (×2): qty 2

## 2022-06-23 MED ORDER — ADULT MULTIVITAMIN W/MINERALS CH
1.0000 | ORAL_TABLET | Freq: Every day | ORAL | Status: DC
  Administered 2022-06-23 – 2022-06-26 (×4): 1 via ORAL
  Filled 2022-06-23 (×4): qty 1

## 2022-06-23 NOTE — NC FL2 (Signed)
[name] LEVEL OF CARE FORM     IDENTIFICATION  Patient Name: Barbara Nelson Birthdate: 1940/05/27 Sex: female Admission Date (Current Location): 06/18/2022  [facility] and [place] Number:  Herbalist and Address:  The [place], [place] [address], [place], [place] [address]      Provider Number: [id number]  Attending Physician Name and Address:  Jonetta Osgood, MD  Relative Name and Phone Number:       Current Level of Care: Hospital Recommended Level of Care: [place] Prior Approval Number:    Date Approved/Denied:   PASRR Number: [id number]  Discharge Plan: SNF    Current Diagnoses: Patient Active Problem List   Diagnosis Date Noted   Shortness of breath 06/20/2022   Acute respiratory failure with hypoxia ([place]) 06/18/2022   Hyponatremia 06/18/2022   Obesity 01/05/2022   Decreased estrogen level 01/05/2022   Prediabetes 01/05/2022   Dry eyes 01/05/2022   History of pericarditis 01/05/2022   Hypertension 01/05/2022   LBBB (left bundle branch block) 09/17/2017   Benign essential HTN 10/31/2015   Heart palpitations 10/31/2015   Lung mass 06/27/2013   Edema of extremities 06/27/2013   Pericarditis, viral    Postoperative atrial fibrillation (HCC)    History of pericardiectomy 06/19/2013   Chronic pulmonary blastomycosis ([place]) 08/23/2012   Pulmonary nodule, right 03/09/2012   Pericardial effusion/ pericarditis 10/06/2011    Orientation RESPIRATION BLADDER Height & Weight     Self, Time, Situation, Place  O2 (2L nasal cannula) External catheter, Incontinent Weight: 217 lb (98.4 kg) Height:  5\' 5"  (165.1 cm)  BEHAVIORAL SYMPTOMS/MOOD NEUROLOGICAL BOWEL NUTRITION STATUS      Continent Diet (See dc summary)  AMBULATORY STATUS COMMUNICATION OF NEEDS Skin   Limited Assist Verbally Normal                       Personal Care Assistance Level of Assistance  Bathing, Feeding, Dressing Bathing  Assistance: Limited assistance Feeding assistance: Limited assistance Dressing Assistance: Limited assistance     Functional Limitations Info             [place]  PT (By licensed PT), OT (By licensed OT)     PT Frequency: 5x/week OT Frequency: 5x/week            Contractures Contractures Info: Not present    Additional Factors Info  Code Status, Allergies, Psychotropic, Insulin Sliding Scale Code Status Info: Full Allergies Info: Latex, Penicillins, Tape, Z-pak (Azithromycin), Eggs Or Egg-derived Products, Amoxil (Amoxicillin), Sulfa Antibiotics Psychotropic Info: Lyrica Insulin Sliding Scale Info: See dc summary       Current Medications (06/23/2022):  This is the current hospital active medication list Current Facility-Administered Medications  Medication Dose Route Frequency Provider Last Rate Last Admin   acetaminophen (TYLENOL) tablet 650 mg  650 mg Oral Q6H PRN Etta Quill, DO   650 mg at 06/19/22 0028   Or   acetaminophen (TYLENOL) suppository 650 mg  650 mg Rectal Q6H PRN Etta Quill, DO       albuterol (PROVENTIL) (2.5 MG/3ML) 0.083% nebulizer solution 2.5 mg  2.5 mg Nebulization Q2H PRN Jonetta Osgood, MD   2.5 mg at 06/22/22 0435   diclofenac Sodium (VOLTAREN) 1 % topical gel 2 g  2 g Topical QID Gerald Leitz D, NP   2 g at 06/23/22 0830   enoxaparin (LOVENOX) injection 40 mg  40 mg Subcutaneous  Q24H Etta Quill, DO   40 mg at 06/23/22 0827   feeding supplement (ENSURE ENLIVE / ENSURE PLUS) liquid 237 mL  237 mL Oral TID BM Harris, Loree Fee D, NP   237 mL at 06/23/22 0830   insulin aspart (novoLOG) injection 0-9 Units  0-9 Units Subcutaneous TID WC Jonetta Osgood, MD   1 Units at 06/20/22 1804   levofloxacin (LEVAQUIN) tablet 750 mg  750 mg Oral Daily Jeneen Rinks, RPH   750 mg at 06/23/22 0827   metoprolol succinate (TOPROL-XL) 24 hr tablet 12.5 mg  12.5 mg Oral Daily Jonetta Osgood, MD   12.5 mg at  06/23/22 0827   multivitamin with minerals tablet 1 tablet  1 tablet Oral Daily Jonetta Osgood, MD       ondansetron [facility]) tablet 4 mg  4 mg Oral Q6H PRN Etta Quill, DO       Or   ondansetron [facility]) injection 4 mg  4 mg Intravenous Q6H PRN Etta Quill, DO   4 mg at 06/19/22 1459   polyethylene glycol (MIRALAX / GLYCOLAX) packet 34 g  34 g Oral Daily PRN Jonetta Osgood, MD       polyvinyl alcohol (LIQUIFILM TEARS) 1.4 % ophthalmic solution 1 drop  1 drop Both Eyes TID Etta Quill, DO   1 drop at 06/23/22 0839   potassium chloride (KLOR-CON) packet 40 mEq  40 mEq Oral Q4H Jonetta Osgood, MD   40 mEq at 06/23/22 0827   pregabalin (LYRICA) capsule 25 mg  25 mg Oral QHS Etta Quill, DO   25 mg at 06/22/22 2244     Discharge Medications: Please see discharge summary for a list of discharge medications.  Relevant Imaging Results:  Relevant Lab Results:   Additional Information SSn: [id number] [place], [place]

## 2022-06-23 NOTE — Progress Notes (Signed)
Initial Nutrition Assessment  DOCUMENTATION CODES:   Obesity unspecified  INTERVENTION:   Multivitamin w/ minerals daily Liberalize pt diet due to poor appetite Continue 1.5 L fluid restriction Encourage good PO intake Chopped meats on all trays Continue Ensure Enlive po TID, each supplement provides 350 kcal and 20 grams of protein  NUTRITION DIAGNOSIS:   Increased nutrient needs related to acute illness as evidenced by estimated needs.  GOAL:   Patient will meet greater than or equal to 90% of their needs  MONITOR:   PO intake, Labs, I & O's  REASON FOR ASSESSMENT:   Consult Diet education  ASSESSMENT:   82 y.o. presented to the ED with worsening SOB and cough. PMH includes HTN and mycetoma s/p LML lobectomy of lung. Pt admitted with acute respiratory failure with hypoxia, hyponatremia, CAP, and HFpEF exacerbation.  Met with pt and daughter in room. Reports that she does not eat much at home or now. Daughter wondering what they can order for pt. RD explained the importance of just getting in enough calories and protein to get over acute illness. Daughter shares that pt is allergic to egg yolk, but will eat egg whites and blueberries in the morning, occasionally some oatmeal. Will have toast with peanut butter on it, and for lunch/dinner will have chicken, vegetable, and starch. She does not drink dairy. Has tried a few other protein shakes. Discussed plant-based options to try at home. Daughter has some that she drinks that she may have her mother try. Reports a 5# weight loss since starting with the outpatient RD. Per EMR, pt with a 7% weight loss within 8 months, this is not clinically significant for time frame. Although, current body weight appears to be stated.   RD encouraged pt to eat what she would like. Discussed RD liberalizing pt diet to give more options. Family appreciative of that. Explained the importance of getting in enough calories and protein to maintain  lean muscle mass and get over the acute illness.   Medications reviewed and include: NovoLog SSI, Levaquin, Potassium Chloride Labs reviewed: Sodium 130, Hgb A1c 6.9% , 24 hr CBGs 111-139  UOP: 1125 mL x 24 hours  Diet Order:   Diet Order             Diet regular Room service appropriate? Yes; Fluid consistency: Thin; Fluid restriction: 1500 mL Fluid  Diet effective now                   EDUCATION NEEDS:   Education needs have been addressed  Skin:  Skin Assessment: Reviewed RN Assessment  Last BM:  1/31  Height:   Ht Readings from Last 1 Encounters:  06/18/22 5\' 5"  (1.651 m)   Weight: Wt Readings from Last 1 Encounters:  06/18/22 98.4 kg   Ideal Body Weight:  56.8 kg  BMI:  Body mass index is 36.11 kg/m.  Estimated Nutritional Needs:  Kcal:  1900-2100 Protein:  95-110 grams Fluid:  >/= 1.9 L   Hermina Barters RD, LDN Clinical Dietitian See [facility] for contact information.

## 2022-06-23 NOTE — Progress Notes (Signed)
Physical Therapy Treatment Patient Details Name: Barbara Nelson MRN: [id number] DOB: [birth year] Today's Date: 06/23/2022   History of Present Illness 82 y.o. female presents to [facility] hospital on 06/18/2022 with productive cough and SOB. Pt found to be hyponatremic. PMH includes HTN, PAF, mycetoma s/p LML lobectomy of lung, Pericardectomy for tamponade from viral pericarditis.    PT Comments    Pt tolerated today's session well but limited by generalized fatigue. Pt requiring minA for bed mobility and transfers, ambulating ~5 feet with RW and min guard but needing a seated rest break and declining attempts at further ambulation today. Pt reports dizziness when upright, BP stable and SPO2 stable on 2L O2 [place]. Pt will continue to benefit from skilled acute PT to progress mobility, agreeable to further mobility next session, with encouragement from daughter. Discharge plans remain appropriate.     Recommendations for follow up therapy are one component of a multi-disciplinary discharge planning process, led by the attending physician.  Recommendations may be updated based on patient status, additional functional criteria and insurance authorization.  Follow Up Recommendations  Skilled nursing-short term rehab (<3 hours/day) (daughters still debating discharge home with HHPT) Can patient physically be transported by private vehicle: Yes   Assistance Recommended at Discharge Intermittent Supervision/Assistance  Patient can return home with the following A little help with walking and/or transfers;A little help with bathing/dressing/bathroom;Assistance with cooking/housework;Assist for transportation;Help with stairs or ramp for entrance   Equipment Recommendations  BSC/3in1    Recommendations for Other Services       Precautions / Restrictions Precautions Precautions: Fall Restrictions Weight Bearing Restrictions: No     Mobility  Bed Mobility Overal bed mobility: Needs Assistance Bed  Mobility: Supine to Sit     Supine to sit: Min assist, HOB elevated     General bed mobility comments: increased time, use of rails, cueing for technique for getting BLE off the bed, assist for trunk support    Transfers Overall transfer level: Needs assistance Equipment used: Rolling walker (2 wheels) Transfers: Sit to/from Stand Sit to Stand: Min assist           General transfer comment: cueing required for proper hand placement to push up from the bed, assist for increasing trunk flexion prior to standing with minA needed for power up and initial steadying for balance    Ambulation/Gait Ambulation/Gait assistance: Min guard Gait Distance (Feet): 5 Feet Assistive device: Rolling walker (2 wheels) Gait Pattern/deviations: Step-to pattern, Decreased stride length, Wide base of support Gait velocity: reduced     General Gait Details: slowed step-to gait, reduced foot clearance bilaterally   Stairs             Wheelchair Mobility    Modified Rankin (Stroke Patients Only)       Balance Overall balance assessment: Needs assistance Sitting-balance support: No upper extremity supported, Feet supported Sitting balance-Leahy Scale: Fair Sitting balance - Comments: stable while seated EOB, supervision provided for safety   Standing balance support: Bilateral upper extremity supported, Reliant on assistive device for balance, During functional activity Standing balance-Leahy Scale: Poor Standing balance comment: minG provided for safety, reliant on RW with BUE                            Cognition Arousal/Alertness: Awake/alert Behavior During Therapy: Flat affect Overall Cognitive Status: Impaired/Different from baseline Area of Impairment: Problem solving  Following Commands: Follows one step commands consistently, Follows one step commands with increased time Safety/Judgement: Decreased awareness of safety    Problem Solving: Slow processing General Comments: pt with increased time for command following but able to complete        Exercises      General Comments General comments (skin integrity, edema, etc.): on 2L O2 [place], dropping to ~88-89% with mobility and recovering to low 90s at rest. Reports dizziness while seated EOB and after mobilizing. BP in sitting 123/68(82), after ambulating 117/69(84), left in chair with feet elevated      Pertinent Vitals/Pain Pain Assessment Pain Assessment: Faces Faces Pain Scale: Hurts little more Pain Location: back Pain Descriptors / Indicators: Sore Pain Intervention(s): Monitored during session, Limited activity within patient's tolerance, Repositioned    Home Living                          Prior Function            PT Goals (current goals can now be found in the care plan section) Acute Rehab PT Goals Patient Stated Goal: to improve strength, reduced assistance requirements when mobilizing PT Goal Formulation: With patient/family Time For Goal Achievement: 07/03/22 Potential to Achieve Goals: Good Progress towards PT goals: Progressing toward goals    Frequency    Min 3X/week      PT Plan Current plan remains appropriate    Co-evaluation              AM-PAC PT "6 Clicks" Mobility   Outcome Measure  Help needed turning from your back to your side while in a flat bed without using bedrails?: A Little Help needed moving from lying on your back to sitting on the side of a flat bed without using bedrails?: A Little Help needed moving to and from a bed to a chair (including a wheelchair)?: A Little Help needed standing up from a chair using your arms (e.g., wheelchair or bedside chair)?: A Little Help needed to walk in hospital room?: A Little Help needed climbing 3-5 steps with a railing? : Total 6 Click Score: 16    End of Session Equipment Utilized During Treatment: Oxygen;Gait belt Activity Tolerance:  Patient limited by fatigue Patient left: in chair;with call bell/phone within reach;with family/visitor present Nurse Communication: Mobility status PT Visit Diagnosis: Other abnormalities of gait and mobility (R26.89);Muscle weakness (generalized) (M62.81)     Time: 5093-2671 PT Time Calculation (min) (ACUTE ONLY): 23 min  Charges:  $Therapeutic Activity: 23-37 mins                     Charlynne Cousins, PT DPT Acute Rehabilitation Services Office [phone]    Luvenia Heller 06/23/2022, 1:54 PM

## 2022-06-23 NOTE — TOC Progression Note (Signed)
Transition of Care [facility]) - Progression Note    Patient Details  Name: Barbara Nelson MRN: [id number] Date of Birth: 15-Apr-1941  Transition of Care [facility]) CM/SW [place], LCSW Phone Number: 06/23/2022, 3:05 PM  Clinical Narrative:    CSW met with patient and daughter, Levada Dy, [phone] at bedside. She reported that she is in town until Friday helping her mom. CSW explained that CSW received consult for possible SNF placement at time of discharge. She reported that she was not interested in SNF for patient but had a list of places for CSW that someone had recommended since the MD recommended 2 weeks of intesive rehab. CSW explained that the MD likely meant SNF rehab and that the list of places that she has are SNFs. She requested [facility] but CSW checked and they do not have any beds available. Their next choice is [facility] and then [facility]. She reported she will follow up with CSW on their decision. CSW answered questions about home care and DME as well (she stated pt would need a hospital bed if they decided to DC straight home). CSW discussed insurance authorization process.   Skilled Nursing Rehab Facilities-   [email or web address]   Ratings out of 5 stars (5 the highest)   Name Address  Phone # [place] Inspection Overall  [facility] [address], [place] 4 5 2 3   [facility]  [address] [place], [place] [phone] 4 2 5 5   [facility] [place], [place] 1 3 1 1   [place], [place] 2 2 4 4   [facility] [address], [place] 2 1 2 1   [place] 3 3 4 4   [facility] [address], [place] 4 1 3 2   [facility] [address] [place] 4 1 3 2   [address] ([place]) [place],  [place] [phone] 3 1 2 1   [facility] [phone], [place] [phone] 3 1 1 1   [facility] [address], [facility] [phone] 3 2 2 2   [facility]) [place]. [place], [place] [phone] 3 1 1 1   Dustin Flock [address] [place] [phone] 4 2 4 4           [place] 4 1 3 2   [facility]  [address] [place] 3 1 5 4   [facility], [place], [address] [phone] 1 1 2 1   [facility] [address] [phone] 2 2 4 4           [address] (no [facility]) [place] [address], [place] [phone] 5 5 5 5   [facility] (No Humana) [address] [phone] [address] [place], [place] 4 1 4 3   [facility] (No UHC) [place], [place] 5 5 5 5   [facility] [address] [phone] 2 3 5 5   [facility] [address] [place] 1 1 2 1   [facility] [address] [place] 3 1 1 1   [phone] [place], [place] 5 2 5 5   [facility]  [address] [place] 2 2 1 1   [facility] [address] [place]  3 2 1 1   [facility] [place], [place] 2 2 2 2           [facility] [address], [place] [phone] 1 1 1 1   [place] [address], [place]  [phone] 2 4 3 3   [facility] [place] [address], [place] [phone] 3 2 3 3   [place] [address], [place] 2 1 1 1   [place] (No Humana) [address], [place] [phone] 2 2 3 3   [facility]) [address], [place] [phone] 2 1 1 1           [facility] [place], [place] 5 4 5 5   [facility]  ([place])  [address], [place] 2 1 2 1   [facility]) [place] [address], [place] 3 1 4 3   [place] [address], [place] 3 3 4 4   [address] [place], [place] 2 3 1 1   [facility]) [address] [place] [phone] 2 1 4 3       Expected Discharge Plan: [place] Barriers to Discharge: SNF Pending bed offer, Continued Medical Work up  Expected Discharge Plan and Services In-house Referral: Clinical Social Work   Post Acute Care Choice: Resumption of Svcs/PTA Provider Living arrangements for the past 2 months: Single Family Home                                       Social Determinants of Health (SDOH) Interventions SDOH Screenings   Food Insecurity: No Food Insecurity (06/18/2022)  Housing: Low Risk  (06/18/2022)  Transportation Needs: No Transportation Needs (06/18/2022)  Utilities: Not At Risk (06/18/2022)  Tobacco Use: Medium Risk (06/18/2022)    Readmission Risk Interventions     No data to display

## 2022-06-23 NOTE — Care Management Important Message (Signed)
Important Message  Patient Details  Name: Barbara Nelson MRN: [id number] Date of Birth: 03/06/1941   Medicare Important Message Given:  Yes     Orbie Pyo 06/23/2022, 3:11 PM

## 2022-06-23 NOTE — Progress Notes (Signed)
Mobility Specialist Progress Note   06/23/22 1450  Mobility  Activity Transferred from chair to bed;Repositioned in chair (+ seated exercises)  Level of Assistance Moderate assist, patient does 50-74%  Assistive Device Front wheel walker  Distance Ambulated (ft) 8 ft  Activity Response Tolerated fair  Mobility Referral Yes  $Mobility charge 1 Mobility   Pt requesting assistance to get from chair to bed d/t increased fatigue but agreeable to seated level exercises before going back. Required no physical assist for UE & LE exercises but needed increased time d/t fatiguing quickly, x4 rest breaks before finishing. ModA to stand + mod cues for sequencing to get back to bed. No faults on transfer, left supine in bed w/ all needs met and call bell in reach.  [place] Falling Mobility Specialist Please contact via SecureChat or  Rehab office at [phone]

## 2022-06-23 NOTE — Progress Notes (Signed)
PROGRESS NOTE        PATIENT DETAILS Name: Barbara Nelson Age: 82 y.o. Sex: female Date of Birth: 22-Dec-1940 Admit Date: 06/18/2022 Admitting Physician Karmen Bongo, MD FYB:[id number], Abigail Butts, MD  Brief Summary: Patient is a 82 y.o.  female with history of HTN, prior pericardiectomy for tamponade from viral pericarditis, prior history of pulmonary blastomycosis-s/p right lower lobectomy 2014--presented with 2-3-day history of exertional dyspnea-found to have mild acute hypoxic respiratory failure requiring around 2 L of oxygen to maintain O2 saturation above 88%.  Significant events: 2/1>> admit to [facility]-hypoxia-?suspected CHF exacerbation.  Admit to [facility].  Significant studies: 2/1>> CT angio chest: No PE, minimal right pleural effusion, bibasilar subsegmental atelectasis 2/2>> echo: EF 50-55%, mass along the mitral annulus.  Grade 1 diastolic dysfunction  Significant microbiology data: 2/2>> COVID/influenza/RSV PCR: Negative 2/2>> respiratory virus panel: Negative 2/2>>blood culture:no growth  Procedures: None  Consults: None  Subjective: No major issues overnight.  Stable on just 1-2 L of oxygen.  Feels better.  Cough decreasing.  Objective: Vitals: Blood pressure 109/63, pulse 90, temperature 99.5 F (37.5 C), temperature source Oral, resp. rate (!) 23, height 5\' 5"  (1.651 m), weight 98.4 kg, SpO2 97 %.   Exam: Gen Exam:Alert awake-not in any distress HEENT:atraumatic, normocephalic Chest: B/L clear to auscultation anteriorly CVS:S1S2 regular Abdomen:soft non tender, non distended Extremities:trace edema Neurology: Non focal Skin: no rash  Pertinent Labs/Radiology:    Latest Ref Rng & Units 06/19/2022    6:57 AM 06/18/2022    8:03 AM 05/13/2022   12:13 AM  CBC  WBC 4.0 - 10.5 K/uL 7.7  6.6  6.0   Hemoglobin 12.0 - 15.0 g/dL 12.4  13.0  14.1   Hematocrit 36.0 - 46.0 % 36.2  38.3  42.1   Platelets 150 - 400 K/uL 155  171  128     Lab  Results  Component Value Date   NA 130 (L) 06/23/2022   K 3.5 06/23/2022   CL 91 (L) 06/23/2022   CO2 30 06/23/2022     Assessment/Plan: Acute hypoxic respiratory failure Unclear etiology but suspicion for PNA/HFpEF playing a role  Hypoxia better-on 1-2 L of oxygen Levaquin x 5 days Volume status much better-switch to oral Lasix, -5.3 L so far Continue to encourage pulmonary tolerating/incentive spirometry/mobilization Appreciate PCCM/cards input.  Hyponatremia Multifactorial-due to HCTZ/volume overload from perhaps underlying CHF Continue furosemide Follow electrolytes periodically  Hypokalemia Replete/recheck  HFpEF exacerbation See above  CAP See above  Mass along the mitral valve annulus Seen incidentally on echo Appreciate cardiology input-recommendations are to repeat echo in a few weeks.  Hypoalbuminemia Unclear if this is from acute illness or from proteinuria (history of DM) Continue supplements/treat underlying illness-blood pressure too soft to add ARB/ACEI.  History of thoracoscopy with right lung lobectomy 2014 History of pulmonary blastomycosis  History of pericardial effusion-requiring pericardiectomy  HTN BP soft Continue metoprolol.  DM-2 (A1c 6.9 on 2/2) Metformin on hold Monitor CBGs on SSI  Recent Labs    06/22/22 0926 06/22/22 1200 06/22/22 1631  GLUCAP 139* 127* 111*    Debility/deconditioning Significant decline in function since late December when patient had a mechanical fall Poor oral intake/appetite-very frail Encouraged ambulation with therapy-encouraged oral intake-family to decide whether to pursue SNF versus home health services.  Obesity: Estimated body mass index is 36.11 kg/m as calculated from the following:  Height as of this encounter: 5\' 5"  (1.651 m).   Weight as of this encounter: 98.4 kg.   Code status:   Code Status: Full Code   DVT Prophylaxis: enoxaparin (LOVENOX) injection 40 mg Start: 06/19/22  1000   Family Communication: Daughter at bedside   Disposition Plan: Status is: Observation The patient will require care spanning > 2 midnights and should be moved to inpatient because: Severity of illness   Planned Discharge Destination:Home vs SNF   Diet: Diet Order             Diet heart healthy/carb modified Room service appropriate? Yes; Fluid consistency: Thin; Fluid restriction: 1500 mL Fluid  Diet effective now                     Antimicrobial agents: Anti-infectives (From admission, onward)    Start     Dose/Rate Route Frequency Ordered Stop   06/20/22 1030  levofloxacin (LEVAQUIN) tablet 750 mg        750 mg Oral Daily 06/20/22 0932 06/25/22 0959   06/19/22 1200  doxycycline (VIBRA-TABS) tablet 100 mg  Status:  Discontinued        100 mg Oral Every 12 hours 06/19/22 1110 06/20/22 0917        MEDICATIONS: Scheduled Meds:  diclofenac Sodium  2 g Topical QID   enoxaparin (LOVENOX) injection  40 mg Subcutaneous Q24H   feeding supplement  237 mL Oral TID BM   insulin aspart  0-9 Units Subcutaneous TID WC   levofloxacin  750 mg Oral Daily   metoprolol succinate  12.5 mg Oral Daily   polyvinyl alcohol  1 drop Both Eyes TID   potassium chloride  40 mEq Oral Q4H   pregabalin  25 mg Oral QHS   Continuous Infusions:  PRN Meds:.acetaminophen **OR** acetaminophen, albuterol, ondansetron **OR** ondansetron (ZOFRAN) IV, polyethylene glycol   I have personally reviewed following labs and imaging studies  LABORATORY DATA: CBC: Recent Labs  Lab 06/18/22 0803 06/19/22 0657  WBC 6.6 7.7  NEUTROABS 4.4  --   HGB 13.0 12.4  HCT 38.3 36.2  MCV 82.2 82.8  PLT 171 155     Basic Metabolic Panel: Recent Labs  Lab 06/18/22 0803 06/19/22 0657 06/20/22 0315 06/21/22 0721 06/22/22 0211 06/23/22 0505  NA 127* 131* 130* 127* 128* 130*  K 3.3* 3.6 3.8 3.6 3.4* 3.5  CL 91* 95* 93* 90* 89* 91*  CO2 28 28 27 27 30 30   GLUCOSE 133* 109* 93 117* 112* 119*   BUN 8 7* 8 10 10 9   CREATININE 0.71 0.70 0.77 0.78 0.81 0.79  CALCIUM 8.1* 8.3* 8.3* 8.4* 8.7* 9.2  MG 1.8 1.9 1.8 1.6*  --  1.8     GFR: Estimated Creatinine Clearance: 63 mL/min (by C-G formula based on SCr of 0.79 mg/dL).  Liver Function Tests: Recent Labs  Lab 06/19/22 0657 06/20/22 0315  AST 58* 56*  ALT 34 36  ALKPHOS 76 66  BILITOT 0.9 0.9  PROT 5.7* 5.8*  ALBUMIN 1.7* 1.7*    No results for input(s): "LIPASE", "AMYLASE" in the last 168 hours. No results for input(s): "AMMONIA" in the last 168 hours.  Coagulation Profile: No results for input(s): "INR", "PROTIME" in the last 168 hours.  Cardiac Enzymes: No results for input(s): "CKTOTAL", "CKMB", "CKMBINDEX", "TROPONINI" in the last 168 hours.  BNP (last 3 results) No results for input(s): "PROBNP" in the last 8760 hours.  Lipid Profile: No results for input(s): "  CHOL", "HDL", "LDLCALC", "TRIG", "CHOLHDL", "LDLDIRECT" in the last 72 hours.  Thyroid Function Tests: No results for input(s): "TSH", "T4TOTAL", "FREET4", "T3FREE", "THYROIDAB" in the last 72 hours.  Anemia Panel: No results for input(s): "VITAMINB12", "FOLATE", "FERRITIN", "TIBC", "IRON", "RETICCTPCT" in the last 72 hours.  Urine analysis:    Component Value Date/Time   COLORURINE YELLOW 06/19/2022 1456   APPEARANCEUR HAZY (A) 06/19/2022 1456   LABSPEC 1.012 06/19/2022 1456   PHURINE 7.0 06/19/2022 1456   GLUCOSEU NEGATIVE 06/19/2022 1456   HGBUR NEGATIVE 06/19/2022 1456   BILIRUBINUR NEGATIVE 06/19/2022 1456   KETONESUR NEGATIVE 06/19/2022 1456   PROTEINUR 100 (A) 06/19/2022 1456   UROBILINOGEN 0.2 09/15/2011 0530   NITRITE NEGATIVE 06/19/2022 1456   LEUKOCYTESUR NEGATIVE 06/19/2022 1456    Sepsis Labs: Lactic Acid, Venous No results found for: "LATICACIDVEN"  MICROBIOLOGY: Recent Results (from the past 240 hour(s))  Resp panel by RT-PCR (RSV, Flu A&B, Covid) Anterior Nasal Swab     Status: None   Collection Time: 06/19/22 12:25  AM   Specimen: Anterior Nasal Swab  Result Value Ref Range Status   SARS Coronavirus 2 by RT PCR NEGATIVE NEGATIVE Final   Influenza A by PCR NEGATIVE NEGATIVE Final   Influenza B by PCR NEGATIVE NEGATIVE Final    Comment: (NOTE) The Xpert Xpress SARS-CoV-2/FLU/RSV plus assay is intended as an aid in the diagnosis of influenza from Nasopharyngeal swab specimens and should not be used as a sole basis for treatment. Nasal washings and aspirates are unacceptable for Xpert Xpress SARS-CoV-2/FLU/RSV testing.  Fact Sheet for Patients: EntrepreneurPulse.com.au  Fact Sheet for Healthcare Providers: IncredibleEmployment.be  This test is not yet approved or cleared by the [place] FDA and has been authorized for detection and/or diagnosis of SARS-CoV-2 by FDA under an Emergency Use Authorization (EUA). This EUA will remain in effect (meaning this test can be used) for the duration of the COVID-19 declaration under Section 564(b)(1) of the Act, 21 U.S.C. section 360bbb-3(b)(1), unless the authorization is terminated or revoked.     Resp Syncytial Virus by PCR NEGATIVE NEGATIVE Final    Comment: (NOTE) Fact Sheet for Patients: EntrepreneurPulse.com.au  Fact Sheet for Healthcare Providers: IncredibleEmployment.be  This test is not yet approved or cleared by the [place] FDA and has been authorized for detection and/or diagnosis of SARS-CoV-2 by FDA under an Emergency Use Authorization (EUA). This EUA will remain in effect (meaning this test can be used) for the duration of the COVID-19 declaration under Section 564(b)(1) of the Act, 21 U.S.C. section 360bbb-3(b)(1), unless the authorization is terminated or revoked.  Performed at [facility], [place] [address] [place], [place] [address]   Respiratory (~20 pathogens) panel by PCR     Status: None   Collection Time: 06/19/22 12:25 AM   Specimen:  Nasopharyngeal Swab; Respiratory  Result Value Ref Range Status   Adenovirus NOT DETECTED NOT DETECTED Final   Coronavirus 229E NOT DETECTED NOT DETECTED Final    Comment: (NOTE) The Coronavirus on the Respiratory Panel, DOES NOT test for the novel  Coronavirus (2019 nCoV)    Coronavirus HKU1 NOT DETECTED NOT DETECTED Final   Coronavirus NL63 NOT DETECTED NOT DETECTED Final   Coronavirus OC43 NOT DETECTED NOT DETECTED Final   Metapneumovirus NOT DETECTED NOT DETECTED Final   Rhinovirus / Enterovirus NOT DETECTED NOT DETECTED Final   Influenza A NOT DETECTED NOT DETECTED Final   Influenza B NOT DETECTED NOT DETECTED Final   Parainfluenza Virus 1 NOT  DETECTED NOT DETECTED Final   Parainfluenza Virus 2 NOT DETECTED NOT DETECTED Final   Parainfluenza Virus 3 NOT DETECTED NOT DETECTED Final   Parainfluenza Virus 4 NOT DETECTED NOT DETECTED Final   Respiratory Syncytial Virus NOT DETECTED NOT DETECTED Final   Bordetella pertussis NOT DETECTED NOT DETECTED Final   Bordetella Parapertussis NOT DETECTED NOT DETECTED Final   Chlamydophila pneumoniae NOT DETECTED NOT DETECTED Final   Mycoplasma pneumoniae NOT DETECTED NOT DETECTED Final    Comment: Performed at [facility], [place] [address] [place], [place] [address]  Culture, blood (Routine X 2) w Reflex to ID Panel     Status: None (Preliminary result)   Collection Time: 06/19/22 11:27 AM   Specimen: BLOOD LEFT HAND  Result Value Ref Range Status   Specimen Description BLOOD LEFT HAND  Final   Special Requests   Final    BOTTLES DRAWN AEROBIC AND ANAEROBIC Blood Culture adequate volume   Culture   Final    NO GROWTH 4 DAYS Performed at [facility], [place] [address], [place], [place] [address]    Report Status PENDING  Incomplete  Culture, blood (Routine X 2) w Reflex to ID Panel     Status: None (Preliminary result)   Collection Time: 06/19/22 11:27 AM   Specimen: BLOOD RIGHT HAND  Result Value Ref Range Status   Specimen  Description BLOOD RIGHT HAND  Final   Special Requests   Final    BOTTLES DRAWN AEROBIC AND ANAEROBIC Blood Culture adequate volume   Culture   Final    NO GROWTH 4 DAYS Performed at [facility], [place] [address] [place], [place] [address]    Report Status PENDING  Incomplete    RADIOLOGY STUDIES/RESULTS: No results found.   LOS: 4 days   Oren Binet, MD  [facility]    To contact the attending provider between 7A-7P or the covering provider during after hours 7P-7A, please log into the web site [email or web address] and access using universal  password for that web site. If you do not have the password, please call the hospital operator.  06/23/2022, 10:54 AM

## 2022-06-24 DIAGNOSIS — J9601 Acute respiratory failure with hypoxia: Secondary | ICD-10-CM | POA: Diagnosis not present

## 2022-06-24 DIAGNOSIS — E871 Hypo-osmolality and hyponatremia: Secondary | ICD-10-CM | POA: Diagnosis not present

## 2022-06-24 DIAGNOSIS — I10 Essential (primary) hypertension: Secondary | ICD-10-CM | POA: Diagnosis not present

## 2022-06-24 LAB — CORTISOL-AM, BLOOD: Cortisol - AM: 17.3 ug/dL (ref 6.7–22.6)

## 2022-06-24 LAB — CULTURE, BLOOD (ROUTINE X 2)
Culture: NO GROWTH
Culture: NO GROWTH
Special Requests: ADEQUATE
Special Requests: ADEQUATE

## 2022-06-24 LAB — GLUCOSE, CAPILLARY
Glucose-Capillary: 141 mg/dL — ABNORMAL HIGH (ref 70–99)
Glucose-Capillary: 97 mg/dL (ref 70–99)
Glucose-Capillary: 108 mg/dL — ABNORMAL HIGH (ref 70–99)
Glucose-Capillary: 142 mg/dL — ABNORMAL HIGH (ref 70–99)

## 2022-06-24 LAB — TSH: TSH: 2.312 u[IU]/mL (ref 0.350–4.500)

## 2022-06-24 MED ORDER — FLUTICASONE PROPIONATE 50 MCG/ACT NA SUSP
2.0000 | Freq: Every day | NASAL | Status: DC
  Administered 2022-06-24 – 2022-06-26 (×3): 2 via NASAL
  Filled 2022-06-24: qty 16

## 2022-06-24 NOTE — TOC Progression Note (Signed)
Transition of Care [facility]) - Progression Note    Patient Details  Name: Barbara Nelson MRN: [id number] Date of Birth: 09/14/1940  Transition of Care [facility]) CM/SW [place], LCSW Phone Number: 06/24/2022, 1:14 PM  Clinical Narrative:    CSW spoke with patient's daughter Levada Dy to ascertain discharge plan of SNF versus home health. She reported the family is having a meeting tonight to discuss it. Her sisters are still out of town. She will update CSW once decision is known. Patient will require a hospital bed and oxygen if she discharges home.    Expected Discharge Plan: Skilled Nursing Facility Barriers to Discharge: Family Meeting  Expected Discharge Plan and Services In-house Referral: Clinical Social Work   Post Acute Care Choice: Resumption of Svcs/PTA Provider Living arrangements for the past 2 months: Single Family Home                                       Social Determinants of Health (SDOH) Interventions SDOH Screenings   Food Insecurity: No Food Insecurity (06/18/2022)  Housing: Low Risk  (06/18/2022)  Transportation Needs: No Transportation Needs (06/18/2022)  Utilities: Not At Risk (06/18/2022)  Tobacco Use: Medium Risk (06/18/2022)    Readmission Risk Interventions     No data to display

## 2022-06-24 NOTE — Plan of Care (Signed)

## 2022-06-24 NOTE — Progress Notes (Signed)
PROGRESS NOTE        PATIENT DETAILS Name: Barbara Nelson Age: 82 y.o. Sex: female Date of Birth: 08/19/1940 Admit Date: 06/18/2022 Admitting Physician Karmen Bongo, MD [id number], Abigail Butts, MD  Brief Summary: Patient is a 82 y.o.  female with history of HTN, prior pericardiectomy for tamponade from viral pericarditis, prior history of pulmonary blastomycosis-s/p right lower lobectomy 2014--presented with 2-3-day history of exertional dyspnea-found to have mild acute hypoxic respiratory failure requiring around 2 L of oxygen to maintain O2 saturation above 88%.  Significant events: 2/1>> admit to [facility]-hypoxia-?suspected CHF exacerbation.  Admit to [facility].  Significant studies: 2/1>> CT angio chest: No PE, minimal right pleural effusion, bibasilar subsegmental atelectasis 2/2>> echo: EF 50-55%, mass along the mitral annulus.  Grade 1 diastolic dysfunction 7/7>> a.m. cortisol: 17.3 2/7 >>TSH: 2.23 (normal)  Significant microbiology data: 2/2>> COVID/influenza/RSV PCR: Negative 2/2>> respiratory virus panel: Negative 2/2>>blood culture:no growth  Procedures: None  Consults: None  Subjective: On either room air or on minimal amount of oxygen-1-2 L.  Overall much better.  Objective: Vitals: Blood pressure (!) 118/58, pulse 95, temperature 97.8 F (36.6 C), temperature source Oral, resp. rate 20, height 5\' 5"  (1.651 m), weight 98.4 kg, SpO2 (!) 87 %.   Exam: Gen Exam:Alert awake-not in any distress HEENT:atraumatic, normocephalic Chest: B/L clear to auscultation anteriorly CVS:S1S2 regular Abdomen:soft non tender, non distended Extremities:no edema Neurology: Non focal Skin: no rash  Pertinent Labs/Radiology:    Latest Ref Rng & Units 06/19/2022    6:57 AM 06/18/2022    8:03 AM 05/13/2022   12:13 AM  CBC  WBC 4.0 - 10.5 K/uL 7.7  6.6  6.0   Hemoglobin 12.0 - 15.0 g/dL 12.4  13.0  14.1   Hematocrit 36.0 - 46.0 % 36.2  38.3  42.1   Platelets  150 - 400 K/uL 155  171  128     Lab Results  Component Value Date   NA 130 (L) 06/23/2022   K 3.5 06/23/2022   CL 91 (L) 06/23/2022   CO2 30 06/23/2022     Assessment/Plan: Acute hypoxic respiratory failure Unclear etiology but suspicion for PNA/HFpEF playing a role  Overall improved-on minimal amount of oxygen on room air Will complete Levaquin x 5 days on 2/7 No longer on IV Lasix-has been transitioned to oral Lasix-volume status stable Continue pulmonary toileting/incentive spirometry/mobilization Appreciate PCCM/cards input.  Hyponatremia Multifactorial-due to HCTZ/volume overload from perhaps underlying CHF Continue furosemide Follow electrolytes periodically  Hypokalemia Replete/recheck  HFpEF exacerbation See above  CAP See above  Mass along the mitral valve annulus Seen incidentally on echo Appreciate cardiology input-recommendations are to repeat echo in a few weeks.  Hypoalbuminemia Unclear if this is from acute illness or from proteinuria (history of DM) Continue supplements/treat underlying illness-blood pressure too soft to add ARB/ACEI.  History of thoracoscopy with right lung lobectomy 2014 History of pulmonary blastomycosis  History of pericardial effusion-requiring pericardiectomy  HTN BP soft Continue metoprolol.  DM-2 (A1c 6.9 on 2/2) Metformin on hold Monitor CBGs on SSI  Recent Labs    06/23/22 1622 06/23/22 2115 06/24/22 0840  GLUCAP 117* 123* 141*    Debility/deconditioning Significant decline in function since late December when patient had a mechanical fall Poor oral intake/appetite-very frail Encouraged ambulation with therapy-encouraged oral intake-family to decide whether to pursue SNF versus home health services. TSH/a.m. cortisol  levels stable  Obesity: Estimated body mass index is 36.11 kg/m as calculated from the following:   Height as of this encounter: 5\' 5"  (1.651 m).   Weight as of this encounter: 98.4 kg.    Code status:   Code Status: Full Code   DVT Prophylaxis: enoxaparin (LOVENOX) injection 40 mg Start: 06/19/22 1000   Family Communication: Daughter at bedside   Disposition Plan: Status is: Observation The patient will require care spanning > 2 midnights and should be moved to inpatient because: Severity of illness   Planned Discharge Destination:Home vs SNF-family discussion ongoing with TOC   Diet: Diet Order             Diet regular Room service appropriate? Yes; Fluid consistency: Thin; Fluid restriction: 1500 mL Fluid  Diet effective now                     Antimicrobial agents: Anti-infectives (From admission, onward)    Start     Dose/Rate Route Frequency Ordered Stop   06/20/22 1030  levofloxacin (LEVAQUIN) tablet 750 mg        750 mg Oral Daily 06/20/22 0932 06/24/22 0942   06/19/22 1200  doxycycline (VIBRA-TABS) tablet 100 mg  Status:  Discontinued        100 mg Oral Every 12 hours 06/19/22 1110 06/20/22 0917        MEDICATIONS: Scheduled Meds:  diclofenac Sodium  2 g Topical QID   enoxaparin (LOVENOX) injection  40 mg Subcutaneous Q24H   feeding supplement  237 mL Oral TID BM   insulin aspart  0-9 Units Subcutaneous TID WC   metoprolol succinate  12.5 mg Oral Daily   multivitamin with minerals  1 tablet Oral Daily   polyvinyl alcohol  1 drop Both Eyes TID   pregabalin  25 mg Oral QHS   Continuous Infusions:  PRN Meds:.acetaminophen **OR** acetaminophen, albuterol, ondansetron **OR** ondansetron (ZOFRAN) IV, polyethylene glycol   I have personally reviewed following labs and imaging studies  LABORATORY DATA: CBC: Recent Labs  Lab 06/18/22 0803 06/19/22 0657  WBC 6.6 7.7  NEUTROABS 4.4  --   HGB 13.0 12.4  HCT 38.3 36.2  MCV 82.2 82.8  PLT 171 155     Basic Metabolic Panel: Recent Labs  Lab 06/18/22 0803 06/19/22 0657 06/20/22 0315 06/21/22 0721 06/22/22 0211 06/23/22 0505  NA 127* 131* 130* 127* 128* 130*  K 3.3* 3.6  3.8 3.6 3.4* 3.5  CL 91* 95* 93* 90* 89* 91*  CO2 28 28 27 27 30 30   GLUCOSE 133* 109* 93 117* 112* 119*  BUN 8 7* 8 10 10 9   CREATININE 0.71 0.70 0.77 0.78 0.81 0.79  CALCIUM 8.1* 8.3* 8.3* 8.4* 8.7* 9.2  MG 1.8 1.9 1.8 1.6*  --  1.8     GFR: Estimated Creatinine Clearance: 63 mL/min (by C-G formula based on SCr of 0.79 mg/dL).  Liver Function Tests: Recent Labs  Lab 06/19/22 0657 06/20/22 0315  AST 58* 56*  ALT 34 36  ALKPHOS 76 66  BILITOT 0.9 0.9  PROT 5.7* 5.8*  ALBUMIN 1.7* 1.7*    No results for input(s): "LIPASE", "AMYLASE" in the last 168 hours. No results for input(s): "AMMONIA" in the last 168 hours.  Coagulation Profile: No results for input(s): "INR", "PROTIME" in the last 168 hours.  Cardiac Enzymes: No results for input(s): "CKTOTAL", "CKMB", "CKMBINDEX", "TROPONINI" in the last 168 hours.  BNP (last 3 results) No results for input(s): "  PROBNP" in the last 8760 hours.  Lipid Profile: No results for input(s): "CHOL", "HDL", "LDLCALC", "TRIG", "CHOLHDL", "LDLDIRECT" in the last 72 hours.  Thyroid Function Tests: Recent Labs    06/24/22 0651  TSH 2.312    Anemia Panel: No results for input(s): "VITAMINB12", "FOLATE", "FERRITIN", "TIBC", "IRON", "RETICCTPCT" in the last 72 hours.  Urine analysis:    Component Value Date/Time   COLORURINE YELLOW 06/19/2022 1456   APPEARANCEUR HAZY (A) 06/19/2022 1456   LABSPEC 1.012 06/19/2022 1456   PHURINE 7.0 06/19/2022 1456   GLUCOSEU NEGATIVE 06/19/2022 1456   HGBUR NEGATIVE 06/19/2022 1456   BILIRUBINUR NEGATIVE 06/19/2022 1456   KETONESUR NEGATIVE 06/19/2022 1456   PROTEINUR 100 (A) 06/19/2022 1456   UROBILINOGEN 0.2 09/15/2011 0530   NITRITE NEGATIVE 06/19/2022 1456   LEUKOCYTESUR NEGATIVE 06/19/2022 1456    Sepsis Labs: Lactic Acid, Venous No results found for: "LATICACIDVEN"  MICROBIOLOGY: Recent Results (from the past 240 hour(s))  Resp panel by RT-PCR (RSV, Flu A&B, Covid) Anterior Nasal  Swab     Status: None   Collection Time: 06/19/22 12:25 AM   Specimen: Anterior Nasal Swab  Result Value Ref Range Status   SARS Coronavirus 2 by RT PCR NEGATIVE NEGATIVE Final   Influenza A by PCR NEGATIVE NEGATIVE Final   Influenza B by PCR NEGATIVE NEGATIVE Final    Comment: (NOTE) The Xpert Xpress SARS-CoV-2/FLU/RSV plus assay is intended as an aid in the diagnosis of influenza from Nasopharyngeal swab specimens and should not be used as a sole basis for treatment. Nasal washings and aspirates are unacceptable for Xpert Xpress SARS-CoV-2/FLU/RSV testing.  Fact Sheet for Patients: EntrepreneurPulse.com.au  Fact Sheet for Healthcare Providers: IncredibleEmployment.be  This test is not yet approved or cleared by the [place] FDA and has been authorized for detection and/or diagnosis of SARS-CoV-2 by FDA under an Emergency Use Authorization (EUA). This EUA will remain in effect (meaning this test can be used) for the duration of the COVID-19 declaration under Section 564(b)(1) of the Act, 21 U.S.C. section 360bbb-3(b)(1), unless the authorization is terminated or revoked.     Resp Syncytial Virus by PCR NEGATIVE NEGATIVE Final    Comment: (NOTE) Fact Sheet for Patients: EntrepreneurPulse.com.au  Fact Sheet for Healthcare Providers: IncredibleEmployment.be  This test is not yet approved or cleared by the [place] FDA and has been authorized for detection and/or diagnosis of SARS-CoV-2 by FDA under an Emergency Use Authorization (EUA). This EUA will remain in effect (meaning this test can be used) for the duration of the COVID-19 declaration under Section 564(b)(1) of the Act, 21 U.S.C. section 360bbb-3(b)(1), unless the authorization is terminated or revoked.  Performed at [facility], [place] [address], [place], [place] [address]   Respiratory (~20 pathogens) panel by PCR     Status:  None   Collection Time: 06/19/22 12:25 AM   Specimen: Nasopharyngeal Swab; Respiratory  Result Value Ref Range Status   Adenovirus NOT DETECTED NOT DETECTED Final   Coronavirus 229E NOT DETECTED NOT DETECTED Final    Comment: (NOTE) The Coronavirus on the Respiratory Panel, DOES NOT test for the novel  Coronavirus (2019 nCoV)    Coronavirus HKU1 NOT DETECTED NOT DETECTED Final   Coronavirus NL63 NOT DETECTED NOT DETECTED Final   Coronavirus OC43 NOT DETECTED NOT DETECTED Final   Metapneumovirus NOT DETECTED NOT DETECTED Final   Rhinovirus / Enterovirus NOT DETECTED NOT DETECTED Final   Influenza A NOT DETECTED NOT DETECTED Final   Influenza B  NOT DETECTED NOT DETECTED Final   Parainfluenza Virus 1 NOT DETECTED NOT DETECTED Final   Parainfluenza Virus 2 NOT DETECTED NOT DETECTED Final   Parainfluenza Virus 3 NOT DETECTED NOT DETECTED Final   Parainfluenza Virus 4 NOT DETECTED NOT DETECTED Final   Respiratory Syncytial Virus NOT DETECTED NOT DETECTED Final   Bordetella pertussis NOT DETECTED NOT DETECTED Final   Bordetella Parapertussis NOT DETECTED NOT DETECTED Final   Chlamydophila pneumoniae NOT DETECTED NOT DETECTED Final   Mycoplasma pneumoniae NOT DETECTED NOT DETECTED Final    Comment: Performed at [facility], [place] [address] [place], [place] [address]  Culture, blood (Routine X 2) w Reflex to ID Panel     Status: None   Collection Time: 06/19/22 11:27 AM   Specimen: BLOOD LEFT HAND  Result Value Ref Range Status   Specimen Description BLOOD LEFT HAND  Final   Special Requests   Final    BOTTLES DRAWN AEROBIC AND ANAEROBIC Blood Culture adequate volume   Culture   Final    NO GROWTH 5 DAYS Performed at [facility], [place] [address] [place], [place] [address]    Report Status 06/24/2022 FINAL  Final  Culture, blood (Routine X 2) w Reflex to ID Panel     Status: None   Collection Time: 06/19/22 11:27 AM   Specimen: BLOOD RIGHT HAND  Result Value Ref Range  Status   Specimen Description BLOOD RIGHT HAND  Final   Special Requests   Final    BOTTLES DRAWN AEROBIC AND ANAEROBIC Blood Culture adequate volume   Culture   Final    NO GROWTH 5 DAYS Performed at [facility], [place] [address] [place], [place] [address]    Report Status 06/24/2022 FINAL  Final    RADIOLOGY STUDIES/RESULTS: No results found.   LOS: 5 days   Oren Binet, MD  [facility]    To contact the attending provider between 7A-7P or the covering provider during after hours 7P-7A, please log into the web site [email or web address] and access using universal [place] password for that web site. If you do not have the password, please call the hospital operator.  06/24/2022, 12:05 PM

## 2022-06-24 NOTE — Progress Notes (Signed)
Mobility Specialist Progress Note   06/24/22 1715  Mobility  Activity Stood at bedside (+ x5 Marches)  Level of Assistance Moderate assist, patient does Land Ambulated (ft) 2 ft  Activity Response Tolerated well  Mobility Referral Yes  $Mobility charge 1 Mobility   Pre Mobility: 89 HR, 94% SpO2 During Mobility: 116 HR, 106/58 BP, 89% SpO2 Post Mobility: 94 HR, 104/66 BP, 91% SpO2  Pt agreeable to mobility despite tiredness. ModA to  EOB d/t R hip pain and general weakness. X3 STS + x5 Marches ea. standing trial. Pt not having much lift w/ marches but hip activation present. Session limited by fatigue and pt returned back supine w/o fault. Call bell in reach and bed alarm on.  [place] Mobility Specialist Please contact via SecureChat or  Rehab office at [phone]

## 2022-06-24 NOTE — Progress Notes (Signed)
Pulse oximetry on SATURATION QUALIFICATIONS: (This note is used to comply with regulatory documentation for home oxygen)  Patient Saturations on Room Air at Rest = 88%  Patient Saturations on Room Air while Ambulating = 86%  Patient Saturations on 1.5 Liters of oxygen while Ambulating = 92%  Please briefly explain why patient needs home oxygen:room air is 86.   Patient requires supplemental oxygen to maintain oxygen saturations at acceptable, safe levels with physical activity.  Tyrone Schimke, [place] [facility] Office: [phone]

## 2022-06-24 NOTE — Progress Notes (Signed)
Occupational Therapy Treatment Patient Details Name: Barbara Nelson MRN: [id number] DOB: 12/31/1940 Today's Date: 06/24/2022   History of present illness 82 y.o. female presents to [facility] hospital on 06/18/2022 with productive cough and SOB. Pt found to be hyponatremic. PMH includes HTN, PAF, mycetoma s/p LML lobectomy of lung, Pericardectomy for tamponade from viral pericarditis.   OT comments  Pt progressing towards acute OT goals. Focus of session was functional mobility towards toilet transfer goal. Pt needed up to mod A to take pivotal steps. Pt fatigued quickly and needed assist to control descent into chair; unable to further mobilize. Pt received on 1L O2 with SpO2 90. Trialed on RA with SpO2 dropping to 86. Pt lightheaded for several minutes EOB; reapplied [place] a 1L. Able to maintain SpO2 92 on 1.5L. Daughter present at start and end of session. D/c recommendations remain appropriate.    Recommendations for follow up therapy are one component of a multi-disciplinary discharge planning process, led by the attending physician.  Recommendations may be updated based on patient status, additional functional criteria and insurance authorization.    Follow Up Recommendations  Skilled nursing-short term rehab (<3 hours/day)     Assistance Recommended at Discharge Frequent or constant Supervision/Assistance  Patient can return home with the following  A lot of help with walking and/or transfers;A lot of help with bathing/dressing/bathroom;Assistance with cooking/housework;Help with stairs or ramp for entrance;Assist for transportation;Direct supervision/assist for medications management;Direct supervision/assist for financial management   Equipment Recommendations  Other (comment) (defer to next venue)    Recommendations for Other Services      Precautions / Restrictions Precautions Precautions: Fall Restrictions Weight Bearing Restrictions: No       Mobility Bed Mobility Overal bed  mobility: Needs Assistance Bed Mobility: Supine to Sit     Supine to sit: Mod assist, HOB elevated     General bed mobility comments: sequencing cues. mod A to powerup trunk. Pt able to pivot hips to full EOB with extra time and effort but no physical assist    Transfers Overall transfer level: Needs assistance Equipment used: Rolling walker (2 wheels) Transfers: Bed to chair/wheelchair/BSC, Sit to/from Stand Sit to Stand: Mod assist Stand pivot transfers: Min assist         General transfer comment: EOB to recliner. Sequencing cues. delayed responses. physically assist to steady and to control descent as pt fatigued quickly in standing.     Balance Overall balance assessment: Needs assistance Sitting-balance support: No upper extremity supported, Feet supported Sitting balance-Leahy Scale: Fair     Standing balance support: Bilateral upper extremity supported, Reliant on assistive device for balance, During functional activity Standing balance-Leahy Scale: Poor Standing balance comment: minG provided for safety, reliant on RW with BUE                           ADL either performed or assessed with clinical judgement   ADL Overall ADL's : Needs assistance/impaired                         Toilet Transfer: Moderate assistance;Stand-pivot;Rolling walker (2 wheels);BSC/3in1 Toilet Transfer Details (indicate cue type and reason): simulated to recliner. min A to rise and pivot, mod A to control descent into recliner.           General ADL Comments: some delayed responses but able to follow all one step commands. Pt fatigued quickly pivoting to recliner. Assist needed to control  descent.    Extremity/Trunk Assessment Upper Extremity Assessment Upper Extremity Assessment: Generalized weakness   Lower Extremity Assessment Lower Extremity Assessment: Defer to PT evaluation        Vision       Perception     Praxis      Cognition  Arousal/Alertness: Awake/alert Behavior During Therapy: Flat affect Overall Cognitive Status: Impaired/Different from baseline Area of Impairment: Memory, Following commands, Problem solving                     Memory: Decreased short-term memory Following Commands: Follows one step commands consistently, Follows one step commands with increased time     Problem Solving: Slow processing, Decreased initiation, Difficulty sequencing, Requires verbal cues          Exercises      Shoulder Instructions       General Comments trialed RA with SpO2 dropping to 86. Maintained 92 on 1.5L. Cues for breathing technique with [place]. Daughter present.    Pertinent Vitals/ Pain       Pain Assessment Pain Assessment: Faces Faces Pain Scale: Hurts little more Pain Location: RLE once in recliner Pain Descriptors / Indicators: Sore Pain Intervention(s): Limited activity within patient's tolerance, Monitored during session, Repositioned  Home Living                                          Prior Functioning/Environment              Frequency  Min 2X/week        Progress Toward Goals  OT Goals(current goals can now be found in the care plan section)  Progress towards OT goals: Progressing toward goals  Acute Rehab OT Goals Patient Stated Goal: get better OT Goal Formulation: With patient Time For Goal Achievement: 07/04/22 Potential to Achieve Goals: Good ADL Goals Pt Will Perform Upper Body Bathing: with modified independence;sitting Pt Will Perform Lower Body Bathing: with set-up;sitting/lateral leans Pt Will Perform Lower Body Dressing: with set-up;with adaptive equipment;sit to/from stand;with supervision Pt Will Transfer to Toilet: with supervision;ambulating;bedside commode  Plan Discharge plan remains appropriate    Co-evaluation                 AM-PAC OT "6 Clicks" Daily Activity     Outcome Measure   Help from another person  eating meals?: None Help from another person taking care of personal grooming?: A Little Help from another person toileting, which includes using toliet, bedpan, or urinal?: A Lot Help from another person bathing (including washing, rinsing, drying)?: A Lot Help from another person to put on and taking off regular upper body clothing?: A Little Help from another person to put on and taking off regular lower body clothing?: A Lot 6 Click Score: 16    End of Session Equipment Utilized During Treatment: Rolling walker (2 wheels)  OT Visit Diagnosis: Unsteadiness on feet (R26.81);Muscle weakness (generalized) (M62.81);Other abnormalities of gait and mobility (R26.89);Other symptoms and signs involving cognitive function   Activity Tolerance Patient limited by fatigue   Patient Left in chair;with call bell/phone within reach;with family/visitor present   Nurse Communication          Time: 9675-9163 OT Time Calculation (min): 34 min  Charges: OT General Charges $OT Visit: 1 Visit OT Treatments $Self Care/Home Management : 23-37 mins  Tyrone Schimke, [place] Office: [phone]  Hortencia Pilar 06/24/2022, 12:04 PM

## 2022-06-25 DIAGNOSIS — E871 Hypo-osmolality and hyponatremia: Secondary | ICD-10-CM | POA: Diagnosis not present

## 2022-06-25 DIAGNOSIS — I10 Essential (primary) hypertension: Secondary | ICD-10-CM | POA: Diagnosis not present

## 2022-06-25 DIAGNOSIS — J9601 Acute respiratory failure with hypoxia: Secondary | ICD-10-CM | POA: Diagnosis not present

## 2022-06-25 LAB — GLUCOSE, CAPILLARY
Glucose-Capillary: 150 mg/dL — ABNORMAL HIGH (ref 70–99)
Glucose-Capillary: 115 mg/dL — ABNORMAL HIGH (ref 70–99)
Glucose-Capillary: 159 mg/dL — ABNORMAL HIGH (ref 70–99)
Glucose-Capillary: 115 mg/dL — ABNORMAL HIGH (ref 70–99)

## 2022-06-25 MED ORDER — BISACODYL 10 MG RE SUPP
10.0000 mg | Freq: Every day | RECTAL | Status: DC | PRN
  Administered 2022-06-25: 10 mg via RECTAL
  Filled 2022-06-25: qty 1

## 2022-06-25 NOTE — Progress Notes (Signed)
Speech Language Pathology Treatment: Dysphagia  Patient Details Name: Barbara Nelson MRN: [id number] DOB: 03-29-1941 Today's Date: 06/25/2022 Time: 7517-0017 SLP Time Calculation (min) (ACUTE ONLY): 15 min  Assessment / Plan / Recommendation Clinical Impression  Pt seen for f/u dysphagia tx with intake of regular/thin with straw and puree utilizing swallowing precautions of small bites/sips, pacing and moistening oral mucosa prior to intake.  Discussed taking breaks prn during oral intake for energy conservation and increasing protein during intake d/t decreased satiety. Daughter reports pt "doesn't want to eat anything she has to chew a lot" which has impacted overall consumption of POs.  Recommend continue current diet with pt preferred foods given to increase satiety and follow swallowing precautions listed above during all consumption.  ST will continue to f/u in acute setting for dysphagia tx/diet tolerance.     HPI HPI: Barbara Nelson is a 82 y.o. female with medical history significant of HTN, post-op PAF, mycetoma s/p LML lobectomy of lung, Pericardectomy for tamponade from viral pericarditis. Pt in to ED at [facility] with c/o several day h/o cough productive of grey sputum and progressively worsening SOB. CXR on 06/20/22 indicated Unchanged mild CHF pattern with bibasilar atelectasis; BSE completed with Regular/thin recommended with swallowing precautions.  ST f/u for diet tolerance/dysphagia tx.      SLP Plan  Continue with current plan of care      Recommendations for follow up therapy are one component of a multi-disciplinary discharge planning process, led by the attending physician.  Recommendations may be updated based on patient status, additional functional criteria and insurance authorization.    Recommendations  Diet recommendations: Regular;Thin liquid (pt preferred foods/liquids) Liquids provided via: Cup;Straw (small sips) Medication Administration: Whole meds with liquid  (as tolerated or whole/puree) Supervision: Patient able to self feed;Staff to assist with self feeding;Intermittent supervision to cue for compensatory strategies Compensations: Slow rate;Small sips/bites;Other (Comment) (moisten oral mucosa/oral cavity prior to PO consumption) Postural Changes and/or Swallow Maneuvers: Seated upright 90 degrees                Oral Care Recommendations: Oral care BID;Staff/trained caregiver to provide oral care Follow Up Recommendations: No SLP follow up Assistance recommended at discharge: Frequent or constant Supervision/Assistance SLP Visit Diagnosis: Dysphagia, unspecified (R13.10) Plan: Continue with current plan of care           Barbara Nelson,M.S., CCC-SLP  06/25/2022, 12:54 PM

## 2022-06-25 NOTE — Progress Notes (Signed)
Physical Therapy Treatment Patient Details Name: Barbara Nelson MRN: [id number] DOB: [birth year] Today's Date: 06/25/2022   History of Present Illness 82 y.o. female presents to [facility] hospital on 06/18/2022 with productive cough and SOB. Pt found to be hyponatremic. PMH includes HTN, PAF, mycetoma s/p LML lobectomy of lung, Pericardectomy for tamponade from viral pericarditis.    PT Comments    Pt tolerated today's session well, requires increased time between mobility trials due to reports of dizziness but BP has remained stable. Pt placed on 2L prior to standing or ambulation trials as pt maintaining 86-87% SPO2 on room air and 1/1.5 L. Pt requiring modA to stand with cueing for hand placement, ambulating in room with RW and minA with mild verbal cueing for navigating RW around obstacles. Pt performed 3 sit<>stand trials with brief rest breaks from chair for endurance training, increased BLE strength, and proper sequencing of hand placements, min cues provided for hip extension. Pt will continue to benefit from skilled acute PT to continue to progress mobility, discharge plan remains appropriate.     Recommendations for follow up therapy are one component of a multi-disciplinary discharge planning process, led by the attending physician.  Recommendations may be updated based on patient status, additional functional criteria and insurance authorization.  Follow Up Recommendations  Skilled nursing-short term rehab (<3 hours/day) Can patient physically be transported by private vehicle: Yes   Assistance Recommended at Discharge Intermittent Supervision/Assistance  Patient can return home with the following A little help with walking and/or transfers;A little help with bathing/dressing/bathroom;Assistance with cooking/housework;Assist for transportation;Help with stairs or ramp for entrance   Equipment Recommendations  BSC/3in1    Recommendations for Other Services       Precautions /  Restrictions Precautions Precautions: Fall Restrictions Weight Bearing Restrictions: No     Mobility  Bed Mobility               General bed mobility comments: pt seated EOB with daughter upon arrival, pt left EOB with daughter and RN present    Transfers Overall transfer level: Needs assistance Equipment used: Rolling walker (2 wheels) Transfers: Sit to/from Stand Sit to Stand: Mod assist           General transfer comment: pt requiring modA for sit>stand transfers with cueing required for proper hand placement, scooting to the edge, and trunk flexion prior to standing. Pt also provided cueing for reaching back for the seat prior to sitting for a controlled descent, cueing required throughout session. 6 standing trials performed    Ambulation/Gait Ambulation/Gait assistance: Min assist Gait Distance (Feet): 4 Feet (x2 trials) Assistive device: Rolling walker (2 wheels) Gait Pattern/deviations: Step-to pattern, Decreased stride length, Wide base of support, Shuffle Gait velocity: reduced     General Gait Details: slowed step-to gait, reduced foot clearance bilaterally   Stairs             Wheelchair Mobility    Modified Rankin (Stroke Patients Only)       Balance Overall balance assessment: Needs assistance Sitting-balance support: No upper extremity supported, Feet supported Sitting balance-Leahy Scale: Fair Sitting balance - Comments: stable while seated EOB, supervision provided for safety   Standing balance support: Bilateral upper extremity supported, Reliant on assistive device for balance, During functional activity Standing balance-Leahy Scale: Poor Standing balance comment: minG to minA provided during ambulation for safety and balance  Cognition Arousal/Alertness: Awake/alert Behavior During Therapy: Flat affect Overall Cognitive Status: Impaired/Different from baseline Area of Impairment: Memory,  Following commands, Problem solving                     Memory: Decreased short-term memory Following Commands: Follows one step commands consistently, Follows one step commands with increased time Safety/Judgement: Decreased awareness of safety   Problem Solving: Slow processing, Decreased initiation, Difficulty sequencing, Requires verbal cues General Comments: pt with increased time for command following but able to complete        Exercises      General Comments General comments (skin integrity, edema, etc.): pt found on room air, ~86-87% while seated EOB, 2L required to recover and to maintain SPO2 >88% with mobility      Pertinent Vitals/Pain Pain Assessment Pain Assessment: No/denies pain    Home Living                          Prior Function            PT Goals (current goals can now be found in the care plan section) Acute Rehab PT Goals Patient Stated Goal: to improve strength, reduced assistance requirements when mobilizing PT Goal Formulation: With patient/family Time For Goal Achievement: 07/03/22 Potential to Achieve Goals: Good Progress towards PT goals: Progressing toward goals    Frequency    Min 3X/week      PT Plan Current plan remains appropriate    Co-evaluation              AM-PAC PT "6 Clicks" Mobility   Outcome Measure  Help needed turning from your back to your side while in a flat bed without using bedrails?: A Little Help needed moving from lying on your back to sitting on the side of a flat bed without using bedrails?: A Little Help needed moving to and from a bed to a chair (including a wheelchair)?: A Lot Help needed standing up from a chair using your arms (e.g., wheelchair or bedside chair)?: A Lot Help needed to walk in hospital room?: A Little Help needed climbing 3-5 steps with a railing? : Total 6 Click Score: 14    End of Session Equipment Utilized During Treatment: Oxygen;Gait belt Activity  Tolerance: Patient limited by fatigue Patient left: with call bell/phone within reach;with family/visitor present;in bed;with nursing/sitter in room (seated EOB) Nurse Communication: Mobility status PT Visit Diagnosis: Other abnormalities of gait and mobility (R26.89);Muscle weakness (generalized) (M62.81)     Time: 0950-1030 PT Time Calculation (min) (ACUTE ONLY): 40 min  Charges:  $Gait Training: 8-22 mins $Therapeutic Activity: 23-37 mins                     Charlynne Cousins, PT DPT Acute Rehabilitation Services Office [phone]    Barbara Nelson 06/25/2022, 1:06 PM

## 2022-06-25 NOTE — Progress Notes (Signed)
Mobility Specialist: Progress Note   06/25/22 1641  Mobility  Activity Stood at bedside;Transferred to/from [place]  Level of Assistance Moderate assist, patient does 50-74%  Assistive Device Front wheel walker  Distance Ambulated (ft) 4 ft (2'x2)  Activity Response Tolerated well  Mobility Referral Yes  $Mobility charge 1 Mobility   Pre-Mobility: 97 HR, 92% SpO2 Post-Mobility: 89 HR, 96% SpO2  Pt received sitting EOB and agreeable to mobility. Stood x1 with cues for hand placement and upright posture. On second bout of standing pt bowel incontinent and assisted to [facility], BM successful. Once finished pt assist with pericare with help from NT and assisted back to bed. Pt has call bell at her side. Bed alarm is on.   [place] Barbara Nelson Mobility Specialist Please contact via SecureChat or Rehab office at [phone]

## 2022-06-25 NOTE — Plan of Care (Signed)
  Problem: Education: Goal: Knowledge of General Education information will improve Description: Including pain rating scale, medication(s)/side effects and non-pharmacologic comfort measures Outcome: Progressing   Problem: Health Behavior/Discharge Planning: Goal: Ability to manage health-related needs will improve Outcome: Progressing   Problem: Clinical Measurements: Goal: Ability to maintain clinical measurements within normal limits will improve Outcome: Progressing Goal: Will remain free from infection Outcome: Progressing Goal: Diagnostic test results will improve Outcome: Progressing Goal: Respiratory complications will improve Outcome: Progressing Goal: Cardiovascular complication will be avoided Outcome: Progressing   Problem: Activity: Goal: Risk for activity intolerance will decrease Outcome: Progressing   Problem: Nutrition: Goal: Adequate nutrition will be maintained Outcome: Progressing   Problem: Coping: Goal: Level of anxiety will decrease Outcome: Progressing   Problem: Elimination: Goal: Will not experience complications related to bowel motility Outcome: Progressing Goal: Will not experience complications related to urinary retention Outcome: Progressing   Problem: Pain Managment: Goal: General experience of comfort will improve Outcome: Progressing   Problem: Safety: Goal: Ability to remain free from injury will improve Outcome: Progressing   Problem: Skin Integrity: Goal: Risk for impaired skin integrity will decrease Outcome: Progressing   Problem: Education: Goal: Ability to describe self-care measures that may prevent or decrease complications (Diabetes Survival Skills Education) will improve Outcome: Progressing Goal: Individualized Educational Video(s) Outcome: Progressing   Problem: Coping: Goal: Ability to adjust to condition or change in health will improve Outcome: Progressing   Problem: Fluid Volume: Goal: Ability to  maintain a balanced intake and output will improve Outcome: Progressing   Problem: Health Behavior/Discharge Planning: Goal: Ability to identify and utilize available resources and services will improve Outcome: Progressing Goal: Ability to manage health-related needs will improve Outcome: Progressing   Problem: Metabolic: Goal: Ability to maintain appropriate glucose levels will improve Outcome: Progressing   Problem: Nutritional: Goal: Maintenance of adequate nutrition will improve Outcome: Progressing Goal: Progress toward achieving an optimal weight will improve Outcome: Progressing   Problem: Skin Integrity: Goal: Risk for impaired skin integrity will decrease Outcome: Progressing

## 2022-06-25 NOTE — Progress Notes (Addendum)
PROGRESS NOTE        PATIENT DETAILS Name: Barbara Nelson Age: 82 y.o. Sex: female Date of Birth: [birth year] Admit Date: 06/18/2022 Admitting Physician Karmen Bongo, MD [id number], Abigail Butts, MD  Brief Summary: Patient is a 82 y.o.  female with history of HTN, prior pericardiectomy for tamponade from viral pericarditis, prior history of pulmonary blastomycosis-s/p right lower lobectomy 2014--presented with 2-3-day history of exertional dyspnea-found to have mild acute hypoxic respiratory failure requiring around 2 L of oxygen to maintain O2 saturation above 88%.  Significant events: 2/1>> admit to [facility]-hypoxia-?suspected CHF exacerbation.  Admit to [facility].  Significant studies: 2/1>> CT angio chest: No PE, minimal right pleural effusion, bibasilar subsegmental atelectasis 2/2>> echo: EF 50-55%, mass along the mitral annulus.  Grade 1 diastolic dysfunction 2/4>> a.m. cortisol: 17.3 2/7 >>TSH: 2.23 (normal)  Significant microbiology data: 2/2>> COVID/influenza/RSV PCR: Negative 2/2>> respiratory virus panel: Negative 2/2>>blood culture:no growth  Procedures: None  Consults: None  Subjective: Only issue is constipation-family requesting Dulcolax suppository.  Titrated to room air today.  No other issues overnight.  Objective: Vitals: Blood pressure 104/71, pulse 94, temperature 98.5 F (36.9 C), temperature source Oral, resp. rate (!) 25, height 5\' 5"  (1.651 m), weight 98.4 kg, SpO2 95 %.   Exam: Gen Exam:Alert awake-not in any distress HEENT:atraumatic, normocephalic Chest: B/L clear to auscultation anteriorly CVS:S1S2 regular Abdomen:soft non tender, non distended Extremities:no edema Neurology: Non focal Skin: no rash  Pertinent Labs/Radiology:    Latest Ref Rng & Units 06/19/2022    6:57 AM 06/18/2022    8:03 AM 05/13/2022   12:13 AM  CBC  WBC 4.0 - 10.5 K/uL 7.7  6.6  6.0   Hemoglobin 12.0 - 15.0 g/dL 12.4  13.0  14.1   Hematocrit 36.0  - 46.0 % 36.2  38.3  42.1   Platelets 150 - 400 K/uL 155  171  128     Lab Results  Component Value Date   NA 130 (L) 06/23/2022   K 3.5 06/23/2022   CL 91 (L) 06/23/2022   CO2 30 06/23/2022     Assessment/Plan: Acute hypoxic respiratory failure Unclear etiology but suspicion for PNA/HFpEF playing a role  Overall improved-on minimal amount of oxygen on room air Completed Levaquin x 5 days on 2/7 No longer on IV Lasix-has been transitioned to oral Lasix-volume status stable Continue pulmonary toileting/incentive spirometry/mobilization Appreciate PCCM/cards input.  Hyponatremia Multifactorial-due to HCTZ/volume overload from perhaps underlying CHF Continue furosemide Follow electrolytes periodically  Hypokalemia Repleted  HFpEF exacerbation See above  CAP See above  Mass along the mitral valve annulus Seen incidentally on echo Appreciate cardiology input-recommendations are to repeat echo in a few weeks.  Hypoalbuminemia Unclear if this is from acute illness or from proteinuria (history of DM) Continue supplements/treat underlying illness-blood pressure too soft to add ARB/ACEI.  History of thoracoscopy with right lung lobectomy 2014 History of pulmonary blastomycosis  History of pericardial effusion-requiring pericardiectomy  HTN BP soft Continue metoprolol.  DM-2 (A1c 6.9 on 2/2) Metformin on hold Monitor CBGs on SSI  Recent Labs    06/24/22 1624 06/24/22 2126 06/25/22 0831  GLUCAP 108* 142* 150*    Debility/deconditioning Significant decline in function since late December when patient had a mechanical fall Poor oral intake/appetite-very frail Continue PT/OT/pulmonary tolerating/nutritional supplementation TSH/a.m. cortisol levels stale Family has decided for SNF  Obesity: Estimated body  mass index is 36.11 kg/m as calculated from the following:   Height as of this encounter: 5\' 5"  (1.651 m).   Weight as of this encounter: 98.4 kg.   Code  status:   Code Status: Full Code   DVT Prophylaxis: enoxaparin (LOVENOX) injection 40 mg Start: 06/19/22 1000   Family Communication: Daughter at bedside   Disposition Plan: Status is: Observation The patient will require care spanning > 2 midnights and should be moved to inpatient because: Severity of illness   Planned Discharge Destination: SNF-bed available on 2/9.   Diet: Diet Order             Diet regular Room service appropriate? Yes; Fluid consistency: Thin; Fluid restriction: 1500 mL Fluid  Diet effective now                     Antimicrobial agents: Anti-infectives (From admission, onward)    Start     Dose/Rate Route Frequency Ordered Stop   06/20/22 1030  levofloxacin (LEVAQUIN) tablet 750 mg        750 mg Oral Daily 06/20/22 0932 06/24/22 0942   06/19/22 1200  doxycycline (VIBRA-TABS) tablet 100 mg  Status:  Discontinued        100 mg Oral Every 12 hours 06/19/22 1110 06/20/22 0917        MEDICATIONS: Scheduled Meds:  diclofenac Sodium  2 g Topical QID   enoxaparin (LOVENOX) injection  40 mg Subcutaneous Q24H   feeding supplement  237 mL Oral TID BM   fluticasone  2 spray Each Nare Daily   insulin aspart  0-9 Units Subcutaneous TID WC   metoprolol succinate  12.5 mg Oral Daily   multivitamin with minerals  1 tablet Oral Daily   polyvinyl alcohol  1 drop Both Eyes TID   pregabalin  25 mg Oral QHS   Continuous Infusions:  PRN Meds:.acetaminophen **OR** acetaminophen, albuterol, bisacodyl, ondansetron **OR** ondansetron (ZOFRAN) IV, polyethylene glycol   I have personally reviewed following labs and imaging studies  LABORATORY DATA: CBC: Recent Labs  Lab 06/19/22 0657  WBC 7.7  HGB 12.4  HCT 36.2  MCV 82.8  PLT 155     Basic Metabolic Panel: Recent Labs  Lab 06/19/22 0657 06/20/22 0315 06/21/22 0721 06/22/22 0211 06/23/22 0505  NA 131* 130* 127* 128* 130*  K 3.6 3.8 3.6 3.4* 3.5  CL 95* 93* 90* 89* 91*  CO2 28 27 27 30 30    GLUCOSE 109* 93 117* 112* 119*  BUN 7* 8 10 10 9   CREATININE 0.70 0.77 0.78 0.81 0.79  CALCIUM 8.3* 8.3* 8.4* 8.7* 9.2  MG 1.9 1.8 1.6*  --  1.8     GFR: Estimated Creatinine Clearance: 63 mL/min (by C-G formula based on SCr of 0.79 mg/dL).  Liver Function Tests: Recent Labs  Lab 06/19/22 0657 06/20/22 0315  AST 58* 56*  ALT 34 36  ALKPHOS 76 66  BILITOT 0.9 0.9  PROT 5.7* 5.8*  ALBUMIN 1.7* 1.7*    No results for input(s): "LIPASE", "AMYLASE" in the last 168 hours. No results for input(s): "AMMONIA" in the last 168 hours.  Coagulation Profile: No results for input(s): "INR", "PROTIME" in the last 168 hours.  Cardiac Enzymes: No results for input(s): "CKTOTAL", "CKMB", "CKMBINDEX", "TROPONINI" in the last 168 hours.  BNP (last 3 results) No results for input(s): "PROBNP" in the last 8760 hours.  Lipid Profile: No results for input(s): "CHOL", "HDL", "LDLCALC", "TRIG", "CHOLHDL", "LDLDIRECT" in the last  72 hours.  Thyroid Function Tests: Recent Labs    06/24/22 0651  TSH 2.312     Anemia Panel: No results for input(s): "VITAMINB12", "FOLATE", "FERRITIN", "TIBC", "IRON", "RETICCTPCT" in the last 72 hours.  Urine analysis:    Component Value Date/Time   COLORURINE YELLOW 06/19/2022 1456   APPEARANCEUR HAZY (A) 06/19/2022 1456   LABSPEC 1.012 06/19/2022 1456   PHURINE 7.0 06/19/2022 1456   GLUCOSEU NEGATIVE 06/19/2022 1456   HGBUR NEGATIVE 06/19/2022 1456   BILIRUBINUR NEGATIVE 06/19/2022 1456   KETONESUR NEGATIVE 06/19/2022 1456   PROTEINUR 100 (A) 06/19/2022 1456   UROBILINOGEN 0.2 09/15/2011 0530   NITRITE NEGATIVE 06/19/2022 1456   LEUKOCYTESUR NEGATIVE 06/19/2022 1456    Sepsis Labs: Lactic Acid, Venous No results found for: "LATICACIDVEN"  MICROBIOLOGY: Recent Results (from the past 240 hour(s))  Resp panel by RT-PCR (RSV, Flu A&B, Covid) Anterior Nasal Swab     Status: None   Collection Time: 06/19/22 12:25 AM   Specimen: Anterior Nasal  Swab  Result Value Ref Range Status   SARS Coronavirus 2 by RT PCR NEGATIVE NEGATIVE Final   Influenza A by PCR NEGATIVE NEGATIVE Final   Influenza B by PCR NEGATIVE NEGATIVE Final    Comment: (NOTE) The Xpert Xpress SARS-CoV-2/FLU/RSV plus assay is intended as an aid in the diagnosis of influenza from Nasopharyngeal swab specimens and should not be used as a sole basis for treatment. Nasal washings and aspirates are unacceptable for Xpert Xpress SARS-CoV-2/FLU/RSV testing.  Fact Sheet for Patients: EntrepreneurPulse.com.au  Fact Sheet for Healthcare Providers: IncredibleEmployment.be  This test is not yet approved or cleared by the [place] FDA and has been authorized for detection and/or diagnosis of SARS-CoV-2 by FDA under an Emergency Use Authorization (EUA). This EUA will remain in effect (meaning this test can be used) for the duration of the COVID-19 declaration under Section 564(b)(1) of the Act, 21 U.S.C. section 360bbb-3(b)(1), unless the authorization is terminated or revoked.     Resp Syncytial Virus by PCR NEGATIVE NEGATIVE Final    Comment: (NOTE) Fact Sheet for Patients: EntrepreneurPulse.com.au  Fact Sheet for Healthcare Providers: IncredibleEmployment.be  This test is not yet approved or cleared by the [place] FDA and has been authorized for detection and/or diagnosis of SARS-CoV-2 by FDA under an Emergency Use Authorization (EUA). This EUA will remain in effect (meaning this test can be used) for the duration of the COVID-19 declaration under Section 564(b)(1) of the Act, 21 U.S.C. section 360bbb-3(b)(1), unless the authorization is terminated or revoked.  Performed at [facility], [place] [address] [place], [place] [address]   Respiratory (~20 pathogens) panel by PCR     Status: None   Collection Time: 06/19/22 12:25 AM   Specimen: Nasopharyngeal Swab; Respiratory   Result Value Ref Range Status   Adenovirus NOT DETECTED NOT DETECTED Final   Coronavirus 229E NOT DETECTED NOT DETECTED Final    Comment: (NOTE) The Coronavirus on the Respiratory Panel, DOES NOT test for the novel  Coronavirus (2019 nCoV)    Coronavirus HKU1 NOT DETECTED NOT DETECTED Final   Coronavirus NL63 NOT DETECTED NOT DETECTED Final   Coronavirus OC43 NOT DETECTED NOT DETECTED Final   Metapneumovirus NOT DETECTED NOT DETECTED Final   Rhinovirus / Enterovirus NOT DETECTED NOT DETECTED Final   Influenza A NOT DETECTED NOT DETECTED Final   Influenza B NOT DETECTED NOT DETECTED Final   Parainfluenza Virus 1 NOT DETECTED NOT DETECTED Final   Parainfluenza Virus 2 NOT  DETECTED NOT DETECTED Final   Parainfluenza Virus 3 NOT DETECTED NOT DETECTED Final   Parainfluenza Virus 4 NOT DETECTED NOT DETECTED Final   Respiratory Syncytial Virus NOT DETECTED NOT DETECTED Final   Bordetella pertussis NOT DETECTED NOT DETECTED Final   Bordetella Parapertussis NOT DETECTED NOT DETECTED Final   Chlamydophila pneumoniae NOT DETECTED NOT DETECTED Final   Mycoplasma pneumoniae NOT DETECTED NOT DETECTED Final    Comment: Performed at [facility], [place] [address], [place], [place] [address]  Culture, blood (Routine X 2) w Reflex to ID Panel     Status: None   Collection Time: 06/19/22 11:27 AM   Specimen: BLOOD LEFT HAND  Result Value Ref Range Status   Specimen Description BLOOD LEFT HAND  Final   Special Requests   Final    BOTTLES DRAWN AEROBIC AND ANAEROBIC Blood Culture adequate volume   Culture   Final    NO GROWTH 5 DAYS Performed at [facility], [place] [address], [place], [place] [address]    Report Status 06/24/2022 FINAL  Final  Culture, blood (Routine X 2) w Reflex to ID Panel     Status: None   Collection Time: 06/19/22 11:27 AM   Specimen: BLOOD RIGHT HAND  Result Value Ref Range Status   Specimen Description BLOOD RIGHT HAND  Final   Special Requests   Final     BOTTLES DRAWN AEROBIC AND ANAEROBIC Blood Culture adequate volume   Culture   Final    NO GROWTH 5 DAYS Performed at [facility], [place] [address] [place], [place] [address]    Report Status 06/24/2022 FINAL  Final    RADIOLOGY STUDIES/RESULTS: No results found.   LOS: 6 days   Oren Binet, MD  [facility]    To contact the attending provider between 7A-7P or the covering provider during after hours 7P-7A, please log into the web site [email or web address] and access using universal [place] password for that web site. If you do not have the password, please call the hospital operator.  06/25/2022, 10:46 AM

## 2022-06-25 NOTE — TOC Progression Note (Addendum)
Transition of Care [facility]) - Progression Note    Patient Details  Name: Barbara Nelson MRN: [id number] Date of Birth: 1941-03-28  Transition of Care [facility]) CM/SW [place], LCSW Phone Number: 06/25/2022, 9:05 AM  Clinical Narrative:    9am-CSW spoke with patient's daughter, Barbara Nelson. They have decided on SNF rehab. She requested CSW contact [facility] to see if there is a private pay option since there are no Medicare beds available. CSW awaiting response (left vm for [place] [facility] [place]).   Second option per Barbara Nelson is to see if [facility] has a private pay option if no bed is available.   Third option is [facility].   9:22am: Per [facility], a private room will open tomorrow. CSW updated Barbara Nelson.   2:55pm-CSW updated Barbara Nelson that no response has been received from [facility]. She stated they are fine to proceed with [facility]. She will be heading back home tomorrow so CSW will follow up with her sister Barbara Nelson.    Expected Discharge Plan: Skilled Nursing Facility Barriers to Discharge: SNF Pending bed offer  Expected Discharge Plan and Services In-house Referral: Clinical Social Work   Post Acute Care Choice: Resumption of Svcs/PTA Provider Living arrangements for the past 2 months: Single Family Home                                       Social Determinants of Health (SDOH) Interventions SDOH Screenings   Food Insecurity: No Food Insecurity (06/18/2022)  Housing: Low Risk  (06/18/2022)  Transportation Needs: No Transportation Needs (06/18/2022)  Utilities: Not At Risk (06/18/2022)  Tobacco Use: Medium Risk (06/18/2022)    Readmission Risk Interventions     No data to display

## 2022-06-26 DIAGNOSIS — J9601 Acute respiratory failure with hypoxia: Secondary | ICD-10-CM | POA: Diagnosis not present

## 2022-06-26 DIAGNOSIS — I10 Essential (primary) hypertension: Secondary | ICD-10-CM | POA: Diagnosis not present

## 2022-06-26 DIAGNOSIS — E871 Hypo-osmolality and hyponatremia: Secondary | ICD-10-CM | POA: Diagnosis not present

## 2022-06-26 LAB — GLUCOSE, CAPILLARY
Glucose-Capillary: 138 mg/dL — ABNORMAL HIGH (ref 70–99)
Glucose-Capillary: 150 mg/dL — ABNORMAL HIGH (ref 70–99)
Glucose-Capillary: 118 mg/dL — ABNORMAL HIGH (ref 70–99)

## 2022-06-26 MED ORDER — FLUTICASONE PROPIONATE 50 MCG/ACT NA SUSP: 2.0000 | Freq: Every day | NASAL | 2 refills | Status: DC

## 2022-06-26 MED ORDER — POLYETHYLENE GLYCOL 3350 17 G PO PACK: 17.0000 g | PACK | Freq: Every day | ORAL | 0 refills | Status: DC

## 2022-06-26 MED ORDER — ACETAMINOPHEN 325 MG PO TABS: 650.0000 mg | ORAL_TABLET | Freq: Four times a day (QID) | ORAL | Status: DC | PRN

## 2022-06-26 MED ORDER — INSULIN ASPART 100 UNIT/ML IJ SOLN: INTRAMUSCULAR | 11 refills | Status: DC

## 2022-06-26 MED ORDER — ALBUTEROL SULFATE (2.5 MG/3ML) 0.083% IN NEBU: 2.5000 mg | INHALATION_SOLUTION | RESPIRATORY_TRACT | 12 refills | Status: DC | PRN

## 2022-06-26 MED ORDER — ENSURE ENLIVE PO LIQD: 237.0000 mL | Freq: Three times a day (TID) | ORAL | 12 refills | Status: DC

## 2022-06-26 MED ORDER — METOPROLOL SUCCINATE ER 25 MG PO TB24: 12.5000 mg | ORAL_TABLET | Freq: Every day | ORAL | 1 refills | Status: DC

## 2022-06-26 MED ORDER — POLYETHYLENE GLYCOL 3350 17 G PO PACK
17.0000 g | PACK | Freq: Every day | ORAL | Status: DC
  Filled 2022-06-26: qty 1

## 2022-06-26 MED ORDER — DICLOFENAC SODIUM 1 % EX GEL: 2.0000 g | Freq: Four times a day (QID) | CUTANEOUS | Status: DC

## 2022-06-26 MED ORDER — MAGIC MOUTHWASH: 15.0000 mL | Freq: Four times a day (QID) | ORAL | 0 refills | Status: DC | PRN

## 2022-06-26 MED ORDER — MAGNESIUM HYDROXIDE 400 MG/5ML PO SUSP
15.0000 mL | Freq: Every day | ORAL | Status: DC | PRN
  Administered 2022-06-26: 15 mL via ORAL
  Filled 2022-06-26: qty 30

## 2022-06-26 MED ORDER — MAGNESIUM HYDROXIDE 400 MG/5ML PO SUSP: 15.0000 mL | Freq: Every day | ORAL | 0 refills | Status: DC | PRN

## 2022-06-26 MED ORDER — FUROSEMIDE 40 MG PO TABS: 40.0000 mg | ORAL_TABLET | Freq: Every day | ORAL | 11 refills | Status: DC

## 2022-06-26 MED ORDER — SENNOSIDES-DOCUSATE SODIUM 8.6-50 MG PO TABS: 1.0000 | ORAL_TABLET | Freq: Every day | ORAL | Status: DC

## 2022-06-26 MED ORDER — ADULT MULTIVITAMIN W/MINERALS CH: 1.0000 | ORAL_TABLET | Freq: Every day | ORAL | Status: DC

## 2022-06-26 MED ORDER — BISACODYL 10 MG RE SUPP: 10.0000 mg | Freq: Every day | RECTAL | 0 refills | Status: DC | PRN

## 2022-06-26 MED ORDER — POTASSIUM CHLORIDE CRYS ER 20 MEQ PO TBCR: 20.0000 meq | EXTENDED_RELEASE_TABLET | Freq: Every day | ORAL | Status: DC

## 2022-06-26 MED ORDER — MAGIC MOUTHWASH
15.0000 mL | Freq: Four times a day (QID) | ORAL | Status: DC | PRN
  Filled 2022-06-26: qty 15

## 2022-06-26 NOTE — Discharge Summary (Addendum)
PATIENT DETAILS Name: Barbara Nelson Age: 82 y.o. Sex: female Date of Birth: 05-Nov-1940 MRN: [id number]. Admitting Physician: Karmen Bongo, MD MEQ:[id number], Abigail Butts, MD  Admit Date: 06/18/2022 Discharge date: 06/26/2022  Recommendations for Outpatient Follow-up:  Follow up with PCP in 1-2 weeks Please obtain CMP/CBC in one week Please repeat Echo in 2-3 weeks (see below) Avoid HCTZ in the future  Admitted From:  Home  Disposition: Skilled nursing facility   Discharge Condition: fair  CODE STATUS:   Code Status: Full Code   Diet recommendation:  Diet Order             Diet - low sodium heart healthy           Diet regular Room service appropriate? Yes; Fluid consistency: Thin; Fluid restriction: 1500 mL Fluid  Diet effective now                    Brief Summary: Patient is a 82 y.o.  female with history of HTN, prior pericardiectomy for tamponade from viral pericarditis, prior history of pulmonary blastomycosis-s/p right lower lobectomy 2014--presented with 2-3-day history of exertional dyspnea-found to have mild acute hypoxic respiratory failure requiring around 2 L of oxygen to maintain O2 saturation above 88%.   Significant events: 2/1>> admit to [facility]-hypoxia-?suspected CHF exacerbation.  Admit to [facility].   Significant studies: 2/1>> CT angio chest: No PE, minimal right pleural effusion, bibasilar subsegmental atelectasis 2/2>> echo: EF 50-55%, mass along the mitral annulus.  Grade 1 diastolic dysfunction 2/2>> a.m. cortisol: 17.3 2/7 >>TSH: 2.23 (normal)   Significant microbiology data: 2/2>> COVID/influenza/RSV PCR: Negative 2/2>> respiratory virus panel: Negative 2/2>>blood culture:no growth   Procedures: None   Consults: PCCM Cardiology  Brief Hospital Course: Acute hypoxic respiratory failure Unclear etiology but suspicion for PNA/HFpEF playing a role-in a background of severe failure to thrive syndrome Overall much improved with empiric  Levaquin/diuretics  Hide on room air however required minimal amount of oxygen-especially with ambulation  Has completed Levaquin 2/7 Has been transitioned to oral furosemide-volume status stable  Evaluated by PCCM/cards during this hospitalization Plans are to discharge to SNF rehab    Hyponatremia Multifactorial-due to HCTZ/volume overload from perhaps underlying CHF Continue furosemide Follow electrolytes periodically Avoid HCTZ/thiazide diuretics in the future   Hypokalemia Repleted   HFpEF exacerbation See above Volume status stable-now on oral furosemide   CAP See above   Mass along the mitral valve annulus Seen incidentally on echo Appreciate cardiology input-recommendations are to repeat echo in a few weeks.   Hypoalbuminemia Unclear if this is from acute illness or from proteinuria (history of DM) Continue supplements/treat underlying illness-blood pressure too soft to add ARB/ACEI.   History of thoracoscopy with right lung lobectomy 2014 History of pulmonary blastomycosis   History of pericardial effusion-requiring pericardiectomy   HTN BP in the low 100s Continue Lasix/metoprolol.   DM-2 (A1c 6.9 on 2/2) Resume metformin on discharge.  Debility/deconditioning Significant decline in function since late December when patient had a mechanical fall Poor oral intake/appetite-very frail-suspect does not take a lot for her to tip over Continue PT/OT/pulmonary tolerating/nutritional supplementation TSH/a.m. cortisol levels stale Family has decided for SNF   Obesity: Estimated body mass index is 36.11 kg/m as calculated from the following:   Height as of this encounter: 5\' 5"  (1.651 m).   Weight as of this encounter: 98.4 kg.  Addendum Informed by RN-that family requesting we d/c lyrica and metformin.   Discharge Diagnoses:  Principal Problem:   Acute  respiratory failure with hypoxia (HCC) Active Problems:   Hyponatremia   Benign essential HTN    Shortness of breath   Discharge Instructions:  Activity:  As tolerated with Full fall precautions use walker/cane & assistance as needed  Discharge Instructions     Diet - low sodium heart healthy   Complete by: As directed    Discharge instructions   Complete by: As directed    Follow with Primary MD  Cari Caraway, MD in 1-2 weeks  Please ask your primary care practitioner to repeat echocardiogram in 2-3 weeks.  Avoid diuretics-thiazide diuretics like hydrochlorothiazide in the future.  Please get a complete blood count and chemistry panel checked by your Primary MD at your next visit, and again as instructed by your Primary MD.  Get Medicines reviewed and adjusted: Please take all your medications with you for your next visit with your Primary MD  Laboratory/radiological data: Please request your Primary MD to go over all hospital tests and procedure/radiological results at the follow up, please ask your Primary MD to get all Hospital records sent to his/her office.  In some cases, they will be blood work, cultures and biopsy results pending at the time of your discharge. Please request that your primary care M.D. follows up on these results.  Also Note the following: If you experience worsening of your admission symptoms, develop shortness of breath, life threatening emergency, suicidal or homicidal thoughts you must seek medical attention immediately by calling 911 or calling your MD immediately  if symptoms less severe.  You must read complete instructions/literature along with all the possible adverse reactions/side effects for all the Medicines you take and that have been prescribed to you. Take any new Medicines after you have completely understood and accpet all the possible adverse reactions/side effects.   Do not drive when taking Pain medications or sleeping medications (Benzodaizepines)  Do not take more than prescribed Pain, Sleep and Anxiety Medications. It is  not advisable to combine anxiety,sleep and pain medications without talking with your primary care practitioner  Special Instructions: If you have smoked or chewed Tobacco  in the last 2 yrs please stop smoking, stop any regular Alcohol  and or any Recreational drug use.  Wear Seat belts while driving.  Please note: You were cared for by a hospitalist during your hospital stay. Once you are discharged, your primary care physician will handle any further medical issues. Please note that NO REFILLS for any discharge medications will be authorized once you are discharged, as it is imperative that you return to your primary care physician (or establish a relationship with a primary care physician if you do not have one) for your post hospital discharge needs so that they can reassess your need for medications and monitor your lab values.   Increase activity slowly   Complete by: As directed       Allergies as of 06/26/2022       Reactions   Latex Rash   Penicillins Rash, Itching   Tape Hives, Rash   Z-pak [azithromycin] Rash, Other (See Comments)   Mouth sores   Eggs Or Egg-derived Products Nausea And Vomiting   Amoxil [amoxicillin] Rash   Sulfa Antibiotics Rash        Medication List     STOP taking these medications    metFORMIN 500 MG 24 hr tablet Commonly known as: GLUCOPHAGE-XR   pregabalin 25 MG capsule Commonly known as: LYRICA   Systane Complete 0.6 % Soln Generic drug:  Propylene Glycol       TAKE these medications    acetaminophen 325 MG tablet Commonly known as: TYLENOL Take 2 tablets (650 mg total) by mouth every 6 (six) hours as needed for mild pain (or Fever >/= 101). What changed:  when to take this reasons to take this   albuterol (2.5 MG/3ML) 0.083% nebulizer solution Commonly known as: PROVENTIL Take 3 mLs (2.5 mg total) by nebulization every 2 (two) hours as needed for wheezing or shortness of breath.   bisacodyl 10 MG suppository Commonly known  as: DULCOLAX Place 1 suppository (10 mg total) rectally daily as needed for moderate constipation.   diclofenac Sodium 1 % Gel Commonly known as: VOLTAREN Apply 2 g topically 4 (four) times daily.   feeding supplement Liqd Take 237 mLs by mouth 3 (three) times daily between meals.   fluticasone 50 MCG/ACT nasal spray Commonly known as: FLONASE Place 2 sprays into both nostrils daily.   furosemide 40 MG tablet Commonly known as: Lasix Take 1 tablet (40 mg total) by mouth daily.   insulin aspart 100 UNIT/ML injection Commonly known as: novoLOG 0-9 Units, Subcutaneous, 3 times daily with meals,  CBG < 70: Implement Hypoglycemia measures  CBG 70 - 120: 0 units  CBG 121 - 150: 1 unit  CBG 151 - 200: 2 units  CBG 201 - 250: 3 units  CBG 251 - 300: 5 units  CBG 301 - 350: 7 units  CBG 351 - 400: 9 units  CBG > 400: call MD   magic mouthwash Soln Take 15 mLs by mouth 4 (four) times daily as needed for mouth pain.   magnesium hydroxide 400 MG/5ML suspension Commonly known as: MILK OF MAGNESIA Take 15 mLs by mouth daily as needed for mild constipation.   metoprolol succinate 25 MG 24 hr tablet Commonly known as: TOPROL-XL Take 0.5 tablets (12.5 mg total) by mouth daily. What changed: how much to take   multivitamin with minerals Tabs tablet Take 1 tablet by mouth daily.   polyethylene glycol 17 g packet Commonly known as: MIRALAX / GLYCOLAX Take 17 g by mouth daily. Start taking on: June 27, 2022   potassium chloride SA 20 MEQ tablet Commonly known as: KLOR-CON M Take 1 tablet (20 mEq total) by mouth daily.   senna-docusate 8.6-50 MG tablet Commonly known as: Senokot-S Take 1 tablet by mouth at bedtime. Start taking on: June 27, 2022               Durable Medical Equipment  (From admission, onward)           Start     Ordered   06/24/22 1335  For home use only DME oxygen  Once       Question Answer Comment  Length of Need 6 Months   Mode or  (Route) Nasal cannula   Liters per Minute 2   Frequency Continuous (stationary and portable oxygen unit needed)   Oxygen conserving device Yes   Oxygen delivery system Gas      06/24/22 1335            Contact information for follow-up providers     Cari Caraway, MD. Schedule an appointment as soon as possible for a visit in 1 week(s).   Specialty: Family Medicine Contact information: [place] [address] [phone]              Contact information for after-discharge care     Destination  [facility] Preferred SNF .   Service: Skilled Nursing Contact information: [place] [address] [phone]                    Allergies  Allergen Reactions   Latex Rash   Penicillins Rash and Itching   Tape Hives and Rash   Z-Pak [Azithromycin] Rash and Other (See Comments)    Mouth sores   Eggs Or Egg-Derived Products Nausea And Vomiting   Amoxil [Amoxicillin] Rash   Sulfa Antibiotics Rash     Other Procedures/Studies: VAS [place] LOWER EXTREMITY VENOUS (DVT)  Result Date: 06/22/2022  Lower Venous DVT Study Patient Name:  CAROLJEAN MONSIVAIS  Date of Exam:   06/20/2022 Medical Rec #: [id number]          Accession #:    [id number] Date of Birth: 04-12-1941         Patient Gender: F Patient Age:   22 years Exam Location[facility]  [facility] Procedure:      VAS [place] LOWER EXTREMITY VENOUS (DVT) Referring Phys: Loree Fee HARRIS --------------------------------------------------------------------------------  Indications: Pain.  Comparison Study: No prior right LEV on file Performing Technologist: Sharion Dove RVS  Examination Guidelines: A complete evaluation includes B-mode imaging, spectral Doppler, color Doppler, and power Doppler as needed of all accessible portions of each vessel. Bilateral testing is considered an integral part of a complete examination. Limited examinations for reoccurring indications  may be performed as noted. The reflux portion of the exam is performed with the patient in reverse Trendelenburg.  +--------+---------------+---------+-----------+----------------+-------------+ RIGHT   CompressibilityPhasicitySpontaneityProperties      Thrombus                                                                 Aging         +--------+---------------+---------+-----------+----------------+-------------+ CFV     Full                               pulsatile                                                                waveforms                     +--------+---------------+---------+-----------+----------------+-------------+ SFJ     Full                                                             +--------+---------------+---------+-----------+----------------+-------------+ FV Prox Full                                                             +--------+---------------+---------+-----------+----------------+-------------+  FV Mid  Full                                                             +--------+---------------+---------+-----------+----------------+-------------+ FV      Full                                                             Distal                                                                   +--------+---------------+---------+-----------+----------------+-------------+ PFV     Full                                                             +--------+---------------+---------+-----------+----------------+-------------+ POP     Full                               pulsatile                                                                waveforms                     +--------+---------------+---------+-----------+----------------+-------------+ PTV     Full                                                              +--------+---------------+---------+-----------+----------------+-------------+ PERO    Full                                                             +--------+---------------+---------+-----------+----------------+-------------+   +----+---------------+---------+-----------+-------------------+--------------+ LEFTCompressibilityPhasicitySpontaneityProperties         Thrombus Aging +----+---------------+---------+-----------+-------------------+--------------+ CFV Full                               pulsatile waveforms               +----+---------------+---------+-----------+-------------------+--------------+    Summary: RIGHT: - There is no evidence of deep vein  thrombosis in the lower extremity.  pulsatile waveforms  LEFT: Pulsatile waveforms.  *See table(s) above for measurements and observations. Electronically signed by Servando Snare MD on 06/22/2022 at 2:48:08 PM.    Final    DG Chest Port 1V same Day  Result Date: 06/20/2022 CLINICAL DATA:  Shortness of breath. EXAM: PORTABLE CHEST 1 VIEW COMPARISON:  06/18/2022 FINDINGS: Mild cardiac enlargement is unchanged. Persistent small bilateral pleural effusions. Pulmonary vascular congestion. Mild bibasilar atelectasis, unchanged. IMPRESSION: Unchanged mild CHF pattern with bibasilar atelectasis. Electronically Signed   By: Kerby Moors M.D.   On: 06/20/2022 10:03   ECHOCARDIOGRAM COMPLETE  Result Date: 06/19/2022    ECHOCARDIOGRAM REPORT   Patient Name:   JAYCEE MCKELLIPS Date of Exam: 06/19/2022 Medical Rec #:  [id number]         Height:       65.0 in Accession #:    [id number]        Weight:       217.0 lb Date of Birth:  1940-06-27        BSA:          2.048 m Patient Age:    68 years          BP:           117/69 mmHg Patient Gender: F                 HR:           81 bpm. Exam Location:  Inpatient Procedure: 2D Echo and Intracardiac Opacification Agent Indications:    pulmonary embolus: r/o PHTN as cause for new onset hypoxia with                  ambulation and negative CT chest for lungs  History:        Patient has prior history of Echocardiogram examinations, most                 recent 09/29/2019. Pericarditis in 2021, Arrythmias:LBBB; Risk                 Factors:Hypertension.  Sonographer:    Harvie Junior Referring Phys: [phone] JARED M GARDNER  Sonographer Comments: Technically difficult study due to poor echo windows and patient is obese. Image acquisition challenging due to patient body habitus and Image acquisition challenging due to respiratory motion. IMPRESSIONS  1. Left ventricular ejection fraction, by estimation, is 50 to 55%. The left ventricle has low normal function. The left ventricle has no regional wall motion abnormalities. There is mild concentric left ventricular hypertrophy. Left ventricular diastolic parameters are consistent with Grade I diastolic dysfunction (impaired relaxation).  2. Right ventricular systolic function is normal. The right ventricular size is normal. There is normal pulmonary artery systolic pressure.  3. Mass along the mitral annulus. No evidence of mitral valve regurgitation.  4. The aortic valve was not well visualized. Aortic valve regurgitation is not visualized.  5. The inferior vena cava is normal in size with greater than 50% respiratory variability, suggesting right atrial pressure of 3 mmHg. 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 FINDINGS  Left Ventricle: Left ventricular ejection fraction, by estimation, is 50 to 55%. The left ventricle has low normal function. The left ventricle has no regional wall motion abnormalities. The left ventricular internal cavity size was normal in size. There is mild concentric left ventricular hypertrophy. Left ventricular diastolic parameters are consistent with  Grade I diastolic dysfunction (impaired relaxation). Right Ventricle: The right ventricular size is  normal. Right ventricular systolic function is normal. There is normal pulmonary artery systolic pressure. The tricuspid regurgitant velocity is 2.48 m/s, and with an assumed right atrial pressure of 3 mmHg,  the estimated right ventricular systolic pressure is 78.9 mmHg. Left Atrium: Left atrial size was normal in size. Right Atrium: Right atrial size was normal in size. Pericardium: There is no evidence of pericardial effusion. Mitral Valve: Mass along the mitral annulus. No evidence of mitral valve regurgitation. Tricuspid Valve: Tricuspid valve regurgitation is mild. Aortic Valve: The aortic valve was not well visualized. Aortic valve regurgitation is not visualized. Aortic valve mean gradient measures 6.0 mmHg. Aortic valve peak gradient measures 10.0 mmHg. Aortic valve area, by VTI measures 2.10 cm. Pulmonic Valve: Pulmonic valve regurgitation is not visualized. Venous: The inferior vena cava is normal in size with greater than 50% respiratory variability, suggesting right atrial pressure of 3 mmHg. IAS/Shunts: The interatrial septum was not well visualized.  LEFT VENTRICLE PLAX 2D LVIDd:         3.40 cm     Diastology LVIDs:         2.40 cm     LV e' medial:    5.66 cm/s LV PW:         1.10 cm     LV E/e' medial:  13.9 LV IVS:        1.10 cm     LV e' lateral:   5.22 cm/s LVOT diam:     1.80 cm     LV E/e' lateral: 15.1 LV SV:         53 LV SV Index:   26 LVOT Area:     2.54 cm  LV Volumes (MOD) LV vol d, MOD A2C: 52.8 ml LV vol d, MOD A4C: 68.2 ml LV vol s, MOD A2C: 29.1 ml LV vol s, MOD A4C: 40.8 ml LV SV MOD A2C:     23.7 ml LV SV MOD A4C:     68.2 ml LV SV MOD BP:      25.2 ml RIGHT VENTRICLE RV S prime:     17.30 cm/s TAPSE (M-mode): 1.8 cm LEFT ATRIUM             Index LA diam:        3.60 cm 1.76 cm/m LA Vol (A2C):   38.6 ml 18.85 ml/m LA Vol (A4C):   62.7 ml 30.62 ml/m LA Biplane Vol: 48.9 ml 23.88 ml/m  AORTIC VALVE                     PULMONIC VALVE AV Area (Vmax):    2.01 cm      PV Vmax:        0.89 m/s AV Area (Vmean):   1.79 cm      PV Peak grad:  3.2 mmHg AV Area (VTI):     2.10 cm AV Vmax:           158.00 cm/s AV Vmean:          112.000 cm/s AV VTI:            0.251 m AV Peak Grad:      10.0 mmHg AV Mean Grad:      6.0 mmHg LVOT Vmax:         125.00 cm/s LVOT Vmean:        78.900 cm/s LVOT VTI:  0.207 m LVOT/AV VTI ratio: 0.82  AORTA Ao Root diam: 2.80 cm Ao Asc diam:  2.80 cm MITRAL VALVE                TRICUSPID VALVE MV Area (PHT): 3.78 cm     TR Peak grad:   24.6 mmHg MV Decel Time: 201 msec     TR Vmax:        248.00 cm/s MV E velocity: 78.95 cm/s MV A velocity: 120.50 cm/s  SHUNTS MV E/A ratio:  0.66         Systemic VTI:  0.21 m                             Systemic Diam: 1.80 cm Phineas Inches Electronically signed by Phineas Inches Signature Date/Time: 06/19/2022/11:09:53 AM    Final    CT Angio Chest PE W and/or Wo Contrast  Result Date: 06/18/2022 CLINICAL DATA:  Shortness of breath. EXAM: CT ANGIOGRAPHY CHEST WITH CONTRAST TECHNIQUE: Multidetector CT imaging of the chest was performed using the standard protocol during bolus administration of intravenous contrast. Multiplanar CT image reconstructions and MIPs were obtained to evaluate the vascular anatomy. RADIATION DOSE REDUCTION: This exam was performed according to the departmental dose-optimization program which includes automated exposure control, adjustment of the mA and/or kV according to patient size and/or use of iterative reconstruction technique. CONTRAST:  163mL OMNIPAQUE IOHEXOL 350 MG/ML SOLN COMPARISON:  September 12, 2015. FINDINGS: Cardiovascular: Satisfactory opacification of the pulmonary arteries to the segmental level. No evidence of pulmonary embolism. Mild cardiomegaly. No pericardial effusion. Mediastinum/Nodes: No enlarged mediastinal, hilar, or axillary lymph nodes. Thyroid gland, trachea, and esophagus demonstrate no significant findings. Lungs/Pleura: Minimal right pleural effusion is noted. No pneumothorax  is noted. Minimal bibasilar subsegmental atelectasis is noted. Stable probable scarring is seen in right midlung related to prior lobectomy. Upper Abdomen: No acute abnormality. Musculoskeletal: No chest wall abnormality. No acute or significant osseous findings. Review of the MIP images confirms the above findings. IMPRESSION: No definite evidence of pulmonary embolus. Minimal right pleural effusion with minimal bibasilar subsegmental atelectasis. Electronically Signed   By: Marijo Conception M.D.   On: 06/18/2022 13:05   DG Chest 2 View  Result Date: 06/18/2022 CLINICAL DATA:  SOB EXAM: CHEST - 2 VIEW COMPARISON:  May 12, 2022 FINDINGS: The cardiomediastinal silhouette is unchanged in contour. Trace bilateral pleural effusions. No pneumothorax. Background of diffuse interstitial prominence, similar in comparison to prior. There is more confluent opacity in the RIGHT lateral mid lung and upper lung which are new in comparison to prior. Visualized abdomen is unremarkable. Multilevel degenerative changes of the thoracic spine. IMPRESSION: New confluent opacities in the RIGHT lateral mid lung and upper lung. Differential considerations include infection or superimposed atelectasis on a background of pulmonary edema versus chronic interstitial lung disease. Recommend follow-up PA and lateral chest radiograph 4 weeks after appropriate treatment to assess for resolution. Electronically Signed   By: Valentino Saxon M.D.   On: 06/18/2022 08:38     TODAY-DAY OF DISCHARGE:  Subjective:   Barbara Nelson today has no headache,no chest abdominal pain,no new weakness tingling or numbness, feels much better wants to go home today.   Objective:   Blood pressure 105/70, pulse 97, temperature 98 F (36.7 C), temperature source Oral, resp. rate 20, height 5\' 5"  (1.651 m), weight 98.4 kg, SpO2 96 %. No intake or output data in the 24 hours ending 06/26/22  [place]   06/18/22 0738  Weight: 98.4 kg     Exam: Awake Alert, Oriented *3, No new F.N deficits, Normal affect [place].AT,PERRAL Supple Neck,No JVD, No cervical lymphadenopathy appriciated.  Symmetrical Chest wall movement, Good air movement bilaterally, CTAB RRR,No Gallops,Rubs or new Murmurs, No Parasternal Heave +ve B.Sounds, Abd Soft, Non tender, No organomegaly appriciated, No rebound -guarding or rigidity. No Cyanosis, Clubbing or edema, No new Rash or bruise   PERTINENT RADIOLOGIC STUDIES: No results found.   PERTINENT LAB RESULTS: CBC: No results for input(s): "WBC", "HGB", "HCT", "PLT" in the last 72 hours. CMET CMP     Component Value Date/Time   NA 130 (L) 06/23/2022 0505   K 3.5 06/23/2022 0505   CL 91 (L) 06/23/2022 0505   CO2 30 06/23/2022 0505   GLUCOSE 119 (H) 06/23/2022 0505   BUN 9 06/23/2022 0505   CREATININE 0.79 06/23/2022 0505   CALCIUM 9.2 06/23/2022 0505   PROT 5.8 (L) 06/20/2022 0315   ALBUMIN 1.7 (L) 06/20/2022 0315   AST 56 (H) 06/20/2022 0315   ALT 36 06/20/2022 0315   ALKPHOS 66 06/20/2022 0315   BILITOT 0.9 06/20/2022 0315   GFRNONAA >60 06/23/2022 0505   GFRAA 57 (L) 03/03/2012 2030    GFR Estimated Creatinine Clearance: 63 mL/min (by C-G formula based on SCr of 0.79 mg/dL). No results for input(s): "LIPASE", "AMYLASE" in the last 72 hours. No results for input(s): "CKTOTAL", "CKMB", "CKMBINDEX", "TROPONINI" in the last 72 hours. Invalid input(s): "POCBNP" No results for input(s): "DDIMER" in the last 72 hours. No results for input(s): "HGBA1C" in the last 72 hours. No results for input(s): "CHOL", "HDL", "LDLCALC", "TRIG", "CHOLHDL", "LDLDIRECT" in the last 72 hours. Recent Labs    06/24/22 0651  TSH 2.312   No results for input(s): "VITAMINB12", "FOLATE", "FERRITIN", "TIBC", "IRON", "RETICCTPCT" in the last 72 hours. Coags: No results for input(s): "INR" in the last 72 hours.  Invalid input(s): "PT" Microbiology: Recent Results (from the past 240 hour(s))  Resp panel  by RT-PCR (RSV, Flu A&B, Covid) Anterior Nasal Swab     Status: None   Collection Time: 06/19/22 12:25 AM   Specimen: Anterior Nasal Swab  Result Value Ref Range Status   SARS Coronavirus 2 by RT PCR NEGATIVE NEGATIVE Final   Influenza A by PCR NEGATIVE NEGATIVE Final   Influenza B by PCR NEGATIVE NEGATIVE Final    Comment: (NOTE) The Xpert Xpress SARS-CoV-2/FLU/RSV plus assay is intended as an aid in the diagnosis of influenza from Nasopharyngeal swab specimens and should not be used as a sole basis for treatment. Nasal washings and aspirates are unacceptable for Xpert Xpress SARS-CoV-2/FLU/RSV testing.  Fact Sheet for Patients: EntrepreneurPulse.com.au  Fact Sheet for Healthcare Providers: IncredibleEmployment.be  This test is not yet approved or cleared by the [place] FDA and has been authorized for detection and/or diagnosis of SARS-CoV-2 by FDA under an Emergency Use Authorization (EUA). This EUA will remain in effect (meaning this test can be used) for the duration of the COVID-19 declaration under Section 564(b)(1) of the Act, 21 U.S.C. section 360bbb-3(b)(1), unless the authorization is terminated or revoked.     Resp Syncytial Virus by PCR NEGATIVE NEGATIVE Final    Comment: (NOTE) Fact Sheet for Patients: EntrepreneurPulse.com.au  Fact Sheet for Healthcare Providers: IncredibleEmployment.be  This test is not yet approved or cleared by the [place] FDA and has been authorized for detection and/or diagnosis of SARS-CoV-2 by FDA under an Emergency Use Authorization (  EUA). This EUA will remain in effect (meaning this test can be used) for the duration of the COVID-19 declaration under Section 564(b)(1) of the Act, 21 U.S.C. section 360bbb-3(b)(1), unless the authorization is terminated or revoked.  Performed at [facility], [place] [address] [place], [place] [address]    Respiratory (~20 pathogens) panel by PCR     Status: None   Collection Time: 06/19/22 12:25 AM   Specimen: Nasopharyngeal Swab; Respiratory  Result Value Ref Range Status   Adenovirus NOT DETECTED NOT DETECTED Final   Coronavirus 229E NOT DETECTED NOT DETECTED Final    Comment: (NOTE) The Coronavirus on the Respiratory Panel, DOES NOT test for the novel  Coronavirus (2019 nCoV)    Coronavirus HKU1 NOT DETECTED NOT DETECTED Final   Coronavirus NL63 NOT DETECTED NOT DETECTED Final   Coronavirus OC43 NOT DETECTED NOT DETECTED Final   Metapneumovirus NOT DETECTED NOT DETECTED Final   Rhinovirus / Enterovirus NOT DETECTED NOT DETECTED Final   Influenza A NOT DETECTED NOT DETECTED Final   Influenza B NOT DETECTED NOT DETECTED Final   Parainfluenza Virus 1 NOT DETECTED NOT DETECTED Final   Parainfluenza Virus 2 NOT DETECTED NOT DETECTED Final   Parainfluenza Virus 3 NOT DETECTED NOT DETECTED Final   Parainfluenza Virus 4 NOT DETECTED NOT DETECTED Final   Respiratory Syncytial Virus NOT DETECTED NOT DETECTED Final   Bordetella pertussis NOT DETECTED NOT DETECTED Final   Bordetella Parapertussis NOT DETECTED NOT DETECTED Final   Chlamydophila pneumoniae NOT DETECTED NOT DETECTED Final   Mycoplasma pneumoniae NOT DETECTED NOT DETECTED Final    Comment: Performed at [facility], [place]. [address] [place], [place] [address]  Culture, blood (Routine X 2) w Reflex to ID Panel     Status: None   Collection Time: 06/19/22 11:27 AM   Specimen: BLOOD LEFT HAND  Result Value Ref Range Status   Specimen Description BLOOD LEFT HAND  Final   Special Requests   Final    BOTTLES DRAWN AEROBIC AND ANAEROBIC Blood Culture adequate volume   Culture   Final    NO GROWTH 5 DAYS Performed at [facility], [place] [address] [place], [place] [address]    Report Status 06/24/2022 FINAL  Final  Culture, blood (Routine X 2) w Reflex to ID Panel     Status: None   Collection Time: 06/19/22 11:27 AM    Specimen: BLOOD RIGHT HAND  Result Value Ref Range Status   Specimen Description BLOOD RIGHT HAND  Final   Special Requests   Final    BOTTLES DRAWN AEROBIC AND ANAEROBIC Blood Culture adequate volume   Culture   Final    NO GROWTH 5 DAYS Performed at [facility], [place] [address] [place], [place] [address]    Report Status 06/24/2022 FINAL  Final    FURTHER DISCHARGE INSTRUCTIONS:  Get Medicines reviewed and adjusted: Please take all your medications with you for your next visit with your Primary MD  Laboratory/radiological data: Please request your Primary MD to go over all hospital tests and procedure/radiological results at the follow up, please ask your Primary MD to get all Hospital records sent to his/her office.  In some cases, they will be blood work, cultures and biopsy results pending at the time of your discharge. Please request that your primary care M.D. goes through all the records of your hospital data and follows up on these results.  Also Note the following: If you  experience worsening of your admission symptoms, develop shortness of breath, life threatening emergency, suicidal or homicidal thoughts you must seek medical attention immediately by calling 911 or calling your MD immediately  if symptoms less severe.  You must read complete instructions/literature along with all the possible adverse reactions/side effects for all the Medicines you take and that have been prescribed to you. Take any new Medicines after you have completely understood and accpet all the possible adverse reactions/side effects.   Do not drive when taking Pain medications or sleeping medications (Benzodaizepines)  Do not take more than prescribed Pain, Sleep and Anxiety Medications. It is not advisable to combine anxiety,sleep and pain medications without talking with your primary care practitioner  Special Instructions: If you have smoked or chewed Tobacco  in the last 2 yrs please stop  smoking, stop any regular Alcohol  and or any Recreational drug use.  Wear Seat belts while driving.  Please note: You were cared for by a hospitalist during your hospital stay. Once you are discharged, your primary care physician will handle any further medical issues. Please note that NO REFILLS for any discharge medications will be authorized once you are discharged, as it is imperative that you return to your primary care physician (or establish a relationship with a primary care physician if you do not have one) for your post hospital discharge needs so that they can reassess your need for medications and monitor your lab values.  Total Time spent coordinating discharge including counseling, education and face to face time equals greater than 30 minutes.  SignedOren Binet 06/26/2022 4:03 PM

## 2022-06-26 NOTE — Progress Notes (Signed)
Report called in to [facility].

## 2022-06-26 NOTE — TOC Progression Note (Signed)
Transition of Care [facility]) - Progression Note    Patient Details  Name: Barbara Nelson MRN: [id number] Date of Birth: 04/23/1941  [facility] CM/SW [place], LCSW Phone Number: 06/26/2022, 8:59 AM  Clinical Narrative:    CSW spoke with patient's daughter, Freda Munro, and provided update on discharge plan for today. [place] will reach out to her for admission paperwork. Freda Munro reported agreement with [facility] for transport since patient requires oxygen.   Expected Discharge Plan: Skilled Nursing Facility Barriers to Discharge: SNF Pending bed offer  Expected Discharge Plan and Services In-house Referral: Clinical Social Work   Post Acute Care Choice: Resumption of Svcs/PTA Provider Living arrangements for the past 2 months: Single Family Home                                       Social Determinants of Health (SDOH) Interventions SDOH Screenings   Food Insecurity: No Food Insecurity (06/18/2022)  Housing: Low Risk  (06/18/2022)  Transportation Needs: No Transportation Needs (06/18/2022)  Utilities: Not At Risk (06/18/2022)  Tobacco Use: Medium Risk (06/18/2022)    Readmission Risk Interventions     No data to display

## 2022-06-26 NOTE — Progress Notes (Signed)
Awaiting on [facility] to transport pt to [facility].

## 2022-06-26 NOTE — Progress Notes (Signed)
[facility] has arrived to transport pt to [facility]. Discharge paperwork given to EMS. Pt alert and oriented to baseline and in no acute distress upon discharge.

## 2022-06-26 NOTE — Plan of Care (Signed)

## 2022-06-26 NOTE — TOC Transition Note (Signed)
Transition of Care [facility]) - CM/SW Discharge Note   Patient Details  Name: Barbara Nelson MRN: [id number] Date of Birth: 1941/01/30  [facility]) CM/SW Contact:  Benard Halsted, LCSW Phone Number: 06/26/2022, 2:04 PM   Clinical Narrative:    Patient will DC to: [facility] Anticipated DC date: 06/26/22 Family notified: Daughter, Barbara Nelson Transport by: Corey Harold   Per MD patient ready for DC to [facility]. RN to call report prior to discharge [phone]). RN, patient, patient's family, and facility notified of DC. Discharge Summary and FL2 sent to facility. DC packet on chart. Ambulance transport requested for patient.   CSW will sign off for now as social work intervention is no longer needed. Please consult [place] again if new needs arise.     Final next level of care: Skilled Nursing Facility Barriers to Discharge: Barriers Resolved   Patient Goals and CMS Choice CMS Medicare.gov Compare Post Acute Care list provided to:: Patient Choice offered to / list presented to : Patient, Adult Children  Discharge Placement     Existing PASRR number confirmed : 06/26/22          Patient [facility] bed at: [facility] Patient to be transferred to facility by: [facility] Name of family member notified: Daughters Patient and family notified of of transfer: 06/26/22  Discharge Plan and Services Additional resources added to the After Visit Summary for   In-house Referral: Clinical Social Work   Post Acute Care Choice: Resumption of Svcs/PTA Provider                               Social Determinants of Health (SDOH) Interventions SDOH Screenings   Food Insecurity: No Food Insecurity (06/18/2022)  Housing: Low Risk  (06/18/2022)  Transportation Needs: No Transportation Needs (06/18/2022)  Utilities: Not At Risk (06/18/2022)  Tobacco Use: Medium Risk (06/18/2022)     Readmission Risk Interventions     No data to display

## 2022-06-29 NOTE — Care Management Important Message (Signed)
Important Message  Patient Details  Name: DEYANA WNUK MRN: [id number] Date of Birth: [birth year]   [facility] Important Message Given:  Yes  Patient left prior to IM delivery will mail to the patient home address.   Tyse Auriemma 06/29/2022, 2:47 PM

## 2022-06-30 ENCOUNTER — Other Ambulatory Visit: Payer: Self-pay

## 2022-06-30 ENCOUNTER — Emergency Department (HOSPITAL_COMMUNITY): Payer: Medicare Other

## 2022-06-30 ENCOUNTER — Inpatient Hospital Stay (HOSPITAL_COMMUNITY): Payer: Medicare Other

## 2022-06-30 ENCOUNTER — Encounter (HOSPITAL_COMMUNITY): Payer: Self-pay | Admitting: Internal Medicine

## 2022-06-30 ENCOUNTER — Inpatient Hospital Stay (HOSPITAL_COMMUNITY)
Admission: EM | Admit: 2022-06-30 | Discharge: 2022-07-16 | DRG: 070 | Disposition: A | Discharge status: Home Health | Payer: Medicare Other | Attending: Family Medicine | Admitting: Family Medicine

## 2022-06-30 DIAGNOSIS — E1165 Type 2 diabetes mellitus with hyperglycemia: Secondary | ICD-10-CM | POA: Diagnosis present

## 2022-06-30 DIAGNOSIS — K5649 Other impaction of intestine: Secondary | ICD-10-CM | POA: Diagnosis present

## 2022-06-30 DIAGNOSIS — E876 Hypokalemia: Secondary | ICD-10-CM | POA: Diagnosis not present

## 2022-06-30 DIAGNOSIS — B370 Candidal stomatitis: Secondary | ICD-10-CM | POA: Diagnosis present

## 2022-06-30 DIAGNOSIS — Z794 Long term (current) use of insulin: Secondary | ICD-10-CM | POA: Diagnosis not present

## 2022-06-30 DIAGNOSIS — I5032 Chronic diastolic (congestive) heart failure: Secondary | ICD-10-CM | POA: Diagnosis present

## 2022-06-30 DIAGNOSIS — R000 Tachycardia, unspecified: Secondary | ICD-10-CM | POA: Diagnosis not present

## 2022-06-30 DIAGNOSIS — M1990 Unspecified osteoarthritis, unspecified site: Secondary | ICD-10-CM | POA: Diagnosis present

## 2022-06-30 DIAGNOSIS — E873 Alkalosis: Secondary | ICD-10-CM | POA: Diagnosis present

## 2022-06-30 DIAGNOSIS — D696 Thrombocytopenia, unspecified: Secondary | ICD-10-CM | POA: Diagnosis not present

## 2022-06-30 DIAGNOSIS — R911 Solitary pulmonary nodule: Secondary | ICD-10-CM | POA: Diagnosis present

## 2022-06-30 DIAGNOSIS — N19 Unspecified kidney failure: Secondary | ICD-10-CM | POA: Diagnosis not present

## 2022-06-30 DIAGNOSIS — Z87891 Personal history of nicotine dependence: Secondary | ICD-10-CM

## 2022-06-30 DIAGNOSIS — J301 Allergic rhinitis due to pollen: Secondary | ICD-10-CM

## 2022-06-30 DIAGNOSIS — R4182 Altered mental status, unspecified: Principal | ICD-10-CM

## 2022-06-30 DIAGNOSIS — J9811 Atelectasis: Secondary | ICD-10-CM | POA: Diagnosis present

## 2022-06-30 DIAGNOSIS — E871 Hypo-osmolality and hyponatremia: Secondary | ICD-10-CM | POA: Diagnosis not present

## 2022-06-30 DIAGNOSIS — J9601 Acute respiratory failure with hypoxia: Secondary | ICD-10-CM | POA: Diagnosis not present

## 2022-06-30 DIAGNOSIS — K5641 Fecal impaction: Secondary | ICD-10-CM | POA: Diagnosis present

## 2022-06-30 DIAGNOSIS — R339 Retention of urine, unspecified: Secondary | ICD-10-CM | POA: Diagnosis not present

## 2022-06-30 DIAGNOSIS — E119 Type 2 diabetes mellitus without complications: Secondary | ICD-10-CM | POA: Diagnosis not present

## 2022-06-30 DIAGNOSIS — J309 Allergic rhinitis, unspecified: Secondary | ICD-10-CM | POA: Diagnosis present

## 2022-06-30 DIAGNOSIS — Z79899 Other long term (current) drug therapy: Secondary | ICD-10-CM

## 2022-06-30 DIAGNOSIS — R531 Weakness: Secondary | ICD-10-CM | POA: Diagnosis not present

## 2022-06-30 DIAGNOSIS — G9341 Metabolic encephalopathy: Secondary | ICD-10-CM | POA: Diagnosis present

## 2022-06-30 DIAGNOSIS — B401 Chronic pulmonary blastomycosis: Secondary | ICD-10-CM | POA: Diagnosis present

## 2022-06-30 DIAGNOSIS — I10 Essential (primary) hypertension: Secondary | ICD-10-CM | POA: Diagnosis present

## 2022-06-30 DIAGNOSIS — J189 Pneumonia, unspecified organism: Secondary | ICD-10-CM | POA: Diagnosis present

## 2022-06-30 DIAGNOSIS — L03114 Cellulitis of left upper limb: Secondary | ICD-10-CM | POA: Diagnosis not present

## 2022-06-30 DIAGNOSIS — Z5329 Procedure and treatment not carried out because of patient's decision for other reasons: Secondary | ICD-10-CM | POA: Diagnosis not present

## 2022-06-30 DIAGNOSIS — I110 Hypertensive heart disease with heart failure: Secondary | ICD-10-CM | POA: Diagnosis present

## 2022-06-30 DIAGNOSIS — R1110 Vomiting, unspecified: Secondary | ICD-10-CM | POA: Diagnosis not present

## 2022-06-30 DIAGNOSIS — Z882 Allergy status to sulfonamides status: Secondary | ICD-10-CM

## 2022-06-30 DIAGNOSIS — Z1152 Encounter for screening for COVID-19: Secondary | ICD-10-CM

## 2022-06-30 DIAGNOSIS — E860 Dehydration: Secondary | ICD-10-CM | POA: Diagnosis present

## 2022-06-30 DIAGNOSIS — K5900 Constipation, unspecified: Secondary | ICD-10-CM | POA: Diagnosis not present

## 2022-06-30 DIAGNOSIS — E669 Obesity, unspecified: Secondary | ICD-10-CM | POA: Diagnosis present

## 2022-06-30 DIAGNOSIS — J690 Pneumonitis due to inhalation of food and vomit: Secondary | ICD-10-CM | POA: Diagnosis present

## 2022-06-30 DIAGNOSIS — R059 Cough, unspecified: Secondary | ICD-10-CM | POA: Diagnosis present

## 2022-06-30 DIAGNOSIS — Z880 Allergy status to penicillin: Secondary | ICD-10-CM

## 2022-06-30 DIAGNOSIS — L89312 Pressure ulcer of right buttock, stage 2: Secondary | ICD-10-CM | POA: Diagnosis present

## 2022-06-30 DIAGNOSIS — R7401 Elevation of levels of liver transaminase levels: Secondary | ICD-10-CM | POA: Diagnosis present

## 2022-06-30 DIAGNOSIS — R112 Nausea with vomiting, unspecified: Secondary | ICD-10-CM | POA: Diagnosis not present

## 2022-06-30 DIAGNOSIS — Z6833 Body mass index (BMI) 33.0-33.9, adult: Secondary | ICD-10-CM

## 2022-06-30 DIAGNOSIS — Z9981 Dependence on supplemental oxygen: Secondary | ICD-10-CM

## 2022-06-30 DIAGNOSIS — L8990 Pressure ulcer of unspecified site, unspecified stage: Secondary | ICD-10-CM | POA: Insufficient documentation

## 2022-06-30 DIAGNOSIS — Z881 Allergy status to other antibiotic agents status: Secondary | ICD-10-CM

## 2022-06-30 DIAGNOSIS — J9621 Acute and chronic respiratory failure with hypoxia: Secondary | ICD-10-CM | POA: Diagnosis present

## 2022-06-30 DIAGNOSIS — Z7984 Long term (current) use of oral hypoglycemic drugs: Secondary | ICD-10-CM

## 2022-06-30 DIAGNOSIS — Z91012 Allergy to eggs: Secondary | ICD-10-CM

## 2022-06-30 DIAGNOSIS — Z902 Acquired absence of lung [part of]: Secondary | ICD-10-CM

## 2022-06-30 DIAGNOSIS — Z8249 Family history of ischemic heart disease and other diseases of the circulatory system: Secondary | ICD-10-CM

## 2022-06-30 DIAGNOSIS — I447 Left bundle-branch block, unspecified: Secondary | ICD-10-CM | POA: Diagnosis present

## 2022-06-30 DIAGNOSIS — Z91040 Latex allergy status: Secondary | ICD-10-CM

## 2022-06-30 LAB — RESP PANEL BY RT-PCR (RSV, FLU A&B, COVID)  RVPGX2
Influenza A by PCR: NEGATIVE
Influenza B by PCR: NEGATIVE
Resp Syncytial Virus by PCR: NEGATIVE
SARS Coronavirus 2 by RT PCR: NEGATIVE

## 2022-06-30 LAB — CBC WITH DIFFERENTIAL/PLATELET
Abs Immature Granulocytes: 0.26 10*3/uL — ABNORMAL HIGH (ref 0.00–0.07)
Basophils Absolute: 0 10*3/uL (ref 0.0–0.1)
Basophils Relative: 0 %
Eosinophils Absolute: 0 10*3/uL (ref 0.0–0.5)
Eosinophils Relative: 0 %
HCT: 46.8 % — ABNORMAL HIGH (ref 36.0–46.0)
Hemoglobin: 15.2 g/dL — ABNORMAL HIGH (ref 12.0–15.0)
Immature Granulocytes: 4 %
Lymphocytes Relative: 12 %
Lymphs Abs: 0.8 10*3/uL (ref 0.7–4.0)
MCH: 28.4 pg (ref 26.0–34.0)
MCHC: 32.5 g/dL (ref 30.0–36.0)
MCV: 87.5 fL (ref 80.0–100.0)
Monocytes Absolute: 1.3 10*3/uL — ABNORMAL HIGH (ref 0.1–1.0)
Monocytes Relative: 19 %
Neutro Abs: 4.7 10*3/uL (ref 1.7–7.7)
Neutrophils Relative %: 65 %
Platelets: 192 10*3/uL (ref 150–400)
RBC: 5.35 MIL/uL — ABNORMAL HIGH (ref 3.87–5.11)
RDW: 14.9 % (ref 11.5–15.5)
WBC: 7.2 10*3/uL (ref 4.0–10.5)
nRBC: 0 % (ref 0.0–0.2)

## 2022-06-30 LAB — I-STAT VENOUS BLOOD GAS, ED
Acid-Base Excess: 10 mmol/L — ABNORMAL HIGH (ref 0.0–2.0)
Bicarbonate: 34.8 mmol/L — ABNORMAL HIGH (ref 20.0–28.0)
Calcium, Ion: 1.27 mmol/L (ref 1.15–1.40)
HCT: 44 % (ref 36.0–46.0)
Hemoglobin: 15 g/dL (ref 12.0–15.0)
O2 Saturation: 42 %
Potassium: 4.3 mmol/L (ref 3.5–5.1)
Sodium: 140 mmol/L (ref 135–145)
TCO2: 36 mmol/L — ABNORMAL HIGH (ref 22–32)
pCO2, Ven: 48 mmHg (ref 44–60)
pH, Ven: 7.469 — ABNORMAL HIGH (ref 7.25–7.43)
pO2, Ven: 23 mmHg — CL (ref 32–45)

## 2022-06-30 LAB — COMPREHENSIVE METABOLIC PANEL
ALT: 74 U/L — ABNORMAL HIGH (ref 0–44)
AST: 113 U/L — ABNORMAL HIGH (ref 15–41)
Albumin: 2.5 g/dL — ABNORMAL LOW (ref 3.5–5.0)
Alkaline Phosphatase: 89 U/L (ref 38–126)
Anion gap: 11 (ref 5–15)
BUN: 20 mg/dL (ref 8–23)
CO2: 31 mmol/L (ref 22–32)
Calcium: 10.3 mg/dL (ref 8.9–10.3)
Chloride: 98 mmol/L (ref 98–111)
Creatinine, Ser: 0.97 mg/dL (ref 0.44–1.00)
GFR, Estimated: 58 mL/min — ABNORMAL LOW (ref >60)
Glucose, Bld: 164 mg/dL — ABNORMAL HIGH (ref 70–99)
Potassium: 4.5 mmol/L (ref 3.5–5.1)
Sodium: 140 mmol/L (ref 135–145)
Total Bilirubin: 0.5 mg/dL (ref 0.3–1.2)
Total Protein: 8.3 g/dL — ABNORMAL HIGH (ref 6.5–8.1)

## 2022-06-30 LAB — CBG MONITORING, ED: Glucose-Capillary: 146 mg/dL — ABNORMAL HIGH (ref 70–99)

## 2022-06-30 LAB — LIPASE, BLOOD: Lipase: 24 U/L (ref 11–51)

## 2022-06-30 LAB — PROCALCITONIN: Procalcitonin: <0.1 ng/mL

## 2022-06-30 LAB — MAGNESIUM: Magnesium: 2.3 mg/dL (ref 1.7–2.4)

## 2022-06-30 LAB — BRAIN NATRIURETIC PEPTIDE: B Natriuretic Peptide: 28.2 pg/mL (ref 0.0–100.0)

## 2022-06-30 LAB — TSH: TSH: 1.727 u[IU]/mL (ref 0.350–4.500)

## 2022-06-30 LAB — ACETAMINOPHEN LEVEL: Acetaminophen (Tylenol), Serum: <10 ug/mL — ABNORMAL LOW (ref 10–30)

## 2022-06-30 LAB — AMMONIA: Ammonia: 15 umol/L (ref 9–35)

## 2022-06-30 LAB — PHOSPHORUS: Phosphorus: 3.1 mg/dL (ref 2.5–4.6)

## 2022-06-30 LAB — CK: Total CK: 21 U/L — ABNORMAL LOW (ref 38–234)

## 2022-06-30 MED ORDER — MELATONIN 3 MG PO TABS: 3.0000 mg | ORAL_TABLET | Freq: Every evening | ORAL | Status: DC | PRN

## 2022-06-30 MED ORDER — IOHEXOL 350 MG/ML SOLN
75.0000 mL | Freq: Once | INTRAVENOUS | Status: AC | PRN
  Administered 2022-06-30: 75 mL via INTRAVENOUS

## 2022-06-30 MED ORDER — GADOBUTROL 1 MMOL/ML IV SOLN: 9.0000 mL | Freq: Once | INTRAVENOUS | Status: DC | PRN

## 2022-06-30 MED ORDER — ALBUTEROL SULFATE (2.5 MG/3ML) 0.083% IN NEBU: 2.5000 mg | INHALATION_SOLUTION | RESPIRATORY_TRACT | Status: DC | PRN

## 2022-06-30 MED ORDER — ACETAMINOPHEN 325 MG PO TABS
650.0000 mg | ORAL_TABLET | Freq: Four times a day (QID) | ORAL | Status: DC | PRN
  Administered 2022-07-04 – 2022-07-12 (×5): 650 mg via ORAL
  Filled 2022-06-30 (×6): qty 2

## 2022-06-30 MED ORDER — ONDANSETRON HCL 4 MG/2ML IJ SOLN
4.0000 mg | Freq: Once | INTRAMUSCULAR | Status: AC
  Administered 2022-06-30: 4 mg via INTRAVENOUS
  Filled 2022-06-30: qty 2

## 2022-06-30 MED ORDER — VANCOMYCIN HCL IN DEXTROSE 1-5 GM/200ML-% IV SOLN: 1000.0000 mg | INTRAVENOUS | Status: DC

## 2022-06-30 MED ORDER — FLUTICASONE PROPIONATE 50 MCG/ACT NA SUSP
2.0000 | Freq: Every morning | NASAL | Status: DC
  Administered 2022-07-01 – 2022-07-11 (×8): 2 via NASAL
  Filled 2022-06-30: qty 16

## 2022-06-30 MED ORDER — VANCOMYCIN HCL 2000 MG/400ML IV SOLN
2000.0000 mg | Freq: Once | INTRAVENOUS | Status: AC
  Administered 2022-06-30: 2000 mg via INTRAVENOUS
  Filled 2022-06-30: qty 400

## 2022-06-30 MED ORDER — ACETAMINOPHEN 650 MG RE SUPP
650.0000 mg | Freq: Four times a day (QID) | RECTAL | Status: DC | PRN
  Administered 2022-07-03: 650 mg via RECTAL
  Filled 2022-06-30: qty 1

## 2022-06-30 MED ORDER — INSULIN ASPART 100 UNIT/ML IJ SOLN
0.0000 [IU] | Freq: Three times a day (TID) | INTRAMUSCULAR | Status: DC
  Administered 2022-07-04 – 2022-07-08 (×3): 1 [IU] via SUBCUTANEOUS

## 2022-06-30 MED ORDER — SODIUM CHLORIDE 0.9 % IV SOLN
2.0000 g | Freq: Two times a day (BID) | INTRAVENOUS | Status: DC
  Administered 2022-06-30: 2 g via INTRAVENOUS
  Filled 2022-06-30: qty 12.5

## 2022-06-30 MED ORDER — LACTATED RINGERS IV BOLUS
1000.0000 mL | Freq: Once | INTRAVENOUS | Status: AC
  Administered 2022-06-30: 1000 mL via INTRAVENOUS

## 2022-06-30 MED ORDER — POLYETHYLENE GLYCOL 3350 17 G PO PACK
17.0000 g | PACK | Freq: Every morning | ORAL | Status: DC
  Administered 2022-07-01: 17 g via ORAL
  Filled 2022-06-30: qty 1

## 2022-06-30 MED ORDER — LACTATED RINGERS IV SOLN: INTRAVENOUS | Status: AC

## 2022-06-30 MED ORDER — ONDANSETRON HCL 4 MG/2ML IJ SOLN
4.0000 mg | Freq: Four times a day (QID) | INTRAMUSCULAR | Status: DC | PRN
  Filled 2022-06-30 (×3): qty 2

## 2022-06-30 MED ORDER — BENZONATATE 100 MG PO CAPS
200.0000 mg | ORAL_CAPSULE | Freq: Three times a day (TID) | ORAL | Status: DC | PRN
  Filled 2022-06-30: qty 2

## 2022-06-30 MED ORDER — METOPROLOL SUCCINATE ER 25 MG PO TB24
12.5000 mg | ORAL_TABLET | Freq: Every day | ORAL | Status: DC
  Administered 2022-07-01: 12.5 mg via ORAL
  Filled 2022-06-30 (×3): qty 1

## 2022-06-30 MED ORDER — MENTHOL 3 MG MT LOZG: 1.0000 | LOZENGE | OROMUCOSAL | Status: DC | PRN

## 2022-06-30 MED ORDER — BISACODYL 10 MG RE SUPP
10.0000 mg | Freq: Every day | RECTAL | Status: DC | PRN
  Filled 2022-06-30: qty 1

## 2022-06-30 MED ORDER — SENNOSIDES-DOCUSATE SODIUM 8.6-50 MG PO TABS
1.0000 | ORAL_TABLET | Freq: Two times a day (BID) | ORAL | Status: DC
  Administered 2022-07-01: 1 via ORAL
  Filled 2022-06-30 (×2): qty 1

## 2022-06-30 NOTE — ED Notes (Signed)
Patient transported to CT 

## 2022-06-30 NOTE — ED Triage Notes (Signed)
Pt BIB EMS due to progressive lethargy. Pt was having abd pain and vomiting last night. Pt is conversational at baseline? VSS. Pt on 2L of O2 at baseline

## 2022-06-30 NOTE — ED Notes (Signed)
Enema attempted per order from MD Pfeiffer. Scant amount of stool removed.

## 2022-06-30 NOTE — ED Provider Notes (Signed)
AMS and abdo pain. Labs CT pending  Patient's daughter reports that for the couple of days after her hospitalization patient seem to be showing some signs of improvement.  She was starting to eat a bit and moving around.  However she is taking significant decline in the past 2 days now.  She has become exceedingly lethargic and difficult to awaken.  He reports that she cannot even get up and walk around at this point.  She reports she has been vomiting since last night.  The patient's daughter estimates 5 times.  While in the room the patient had a combination of coughing that sounded more like wet mucous than episodes of emesis.  At this time it is unclear if there is a combination of active vomiting plus fairly wet productive cough episodes.  Patient is not giving additional history.  She is very somnolent. Physical Exam  BP (!) 159/106   Pulse 97   Temp (!) 96.8 F (36 C) (Axillary)   Resp (!) 21   LMP  (LMP Unknown)   SpO2 95%   Physical Exam Constitutional:      Comments: Patient is somnolent but can be seated in an upright position and in the stretcher.  She does not appear to have respiratory distress.  At baseline she uses 2 L of nasal cannula oxygen.  HENT:     Mouth/Throat:     Pharynx: Oropharynx is clear.  Cardiovascular:     Comments: Borderline tachycardia. Abdominal:     Palpations: Abdomen is soft.  Genitourinary:    Comments: Rectal exam done, soft , mobile stool in the vault.  Does not feel impacted. Skin:    General: Skin is warm and dry.  Neurological:     Comments: Patient is quite somnolent.  She does not have any focal flaccid motor deficits.  She will maintain upright posture when seated in semirecumbent position.     Procedures  Procedures  ED Course / MDM   Clinical Course as of 06/30/22 2023  Tue Jun 30, 2022  1918 CT ABDOMEN PELVIS W CONTRAST [MP]    Clinical Course User Index [MP] [name], MD   Medical Decision Making Amount and/or  Complexity of Data Reviewed Labs: ordered. Radiology: ordered. Decision-making details documented in ED Course.  Risk Prescription drug management. Decision regarding hospitalization.  Patient has had declined for about a month but more dramatic decline within the past couple of days.  Patient's daughters are very concerned that as of less than 4 weeks ago the patient was independently active and communicating without difficulty and now has become somnolent and nonambulatory.  At this point of the workup there is no clear etiology.  Urinalysis is pending, added additional labs with TSH mag Phos lactic acid, ammonia level and venous gas.  CT scan reviewed and examined by myself.  Stool in the vault but no obstruction.  Rectal exam done for disimpaction but the stool was quite soft and mobile.  Clinically this is not consistent with fecal impaction.  Patient remains quite somnolent and has frequent wet cough and small amounts of vomiting.  She will require readmission for management of symptoms as well as ongoing diagnostic evaluation.  I have added MRI to evaluate for possible subacute stroke.  Consult: Reviewed with Dr. Manon Hilding for for admission.       [name], MD 06/30/22 2026

## 2022-06-30 NOTE — ED Notes (Signed)
Pts daughter states that she does not want the pt to have a purewick due to irritation during last admission.

## 2022-06-30 NOTE — Progress Notes (Signed)
Pharmacy Antibiotic Note  Barbara Nelson is a 82 y.o. female admitted on 06/30/2022 with pneumonia.  Pharmacy has been consulted for vancomycin and cefepime dosing. Scr 0.97, baseline around 0.8. WBC wnl and patient afebrile.   Plan: Vancomycin 2,000 mg IV x 1 then 1,000 mg IV q24h (eAUC 537.8, Scr 0.97, Vd 0.5)  Cefepime 2g IV q12h F/u C&S, renal function, s/sx improvement, vancomycin levels at SS as indicated      Temp (24hrs), Avg:97.8 F (36.6 C), Min:96.8 F (36 C), Max:98.5 F (36.9 C)  Recent Labs  Lab 06/30/22 1406 06/30/22 1937  WBC 7.2  --   CREATININE 0.97  --   LATICACIDVEN  --  2.1*    Estimated Creatinine Clearance: 52 mL/min (by C-G formula based on SCr of 0.97 mg/dL).    Allergies  Allergen Reactions   Latex Rash   Penicillins Rash and Itching   Tape Hives and Rash   Z-Pak [Azithromycin] Rash and Other (See Comments)    Mouth sores   Eggs Or Egg-Derived Products Nausea And Vomiting   Amoxil [Amoxicillin] Rash   Sulfa Antibiotics Rash    Antimicrobials this admission: 2/13 vancomycin >>  2/13 cefepime >>   Dose adjustments this admission:   Microbiology results: 2/13 BCx: sent 2/13 RVP: neg 2/13 MRSA PCR: sent  Thank you for allowing pharmacy to be a part of this patient's care.  Eliseo Gum, PharmD PGY1 Pharmacy Resident   06/30/2022  9:19 PM

## 2022-06-30 NOTE — ED Notes (Signed)
Pts daughter states that she would prefer for pt to have purewick instead of foley catheter.

## 2022-06-30 NOTE — ED Provider Notes (Signed)
[name] Provider Note  CSN: [id number] Arrival date & time: 06/30/22 1309  Chief Complaint(s) Fatigue, Abdominal Pain, and Emesis  HPI Barbara Nelson is a 82 y.o. female with PMH HTN, previous pericardiectomy for tamponade secondary to bowel pericarditis, pulmonary blastomycosis status post right lower lobe lobectomy, recent hospital discharge on 06/26/2022 for shortness of breath and new oxygen requirement and patient was ultimately discharged home on home oxygen who presents emergency department for evaluation of altered mental status, lethargy, abdominal pain and vomiting.  History obtained from patient's daughter who states that over the last 48 hours patient has had a significant decline in her mental status.  She states her mother is usually awake and conversive but she has been abnormally somnolent, and last night had complaints of abdominal pain with frequent episodes of vomiting.  Here in the emergency department, additional history unable to be obtained due to the patient's underlying current mental status.  Patient is very somnolent and awakens only to noxious stimuli.   Past Medical History Past Medical History:  Diagnosis Date   Benign essential HTN 10/31/2015   Cardiac tamponade due to viral pericarditis    LBBB (left bundle branch block)    Lung mass    fungal infection s/p resection at [facility]   Postoperative atrial fibrillation [facility])    Pulmonary nodule    Patient Active Problem List   Diagnosis Date Noted   Acute metabolic encephalopathy 46/96/2952   Shortness of breath 06/20/2022   Acute respiratory failure with hypoxia ([place]) 06/18/2022   Hyponatremia 06/18/2022   Obesity 01/05/2022   Decreased estrogen level 01/05/2022   Prediabetes 01/05/2022   Dry eyes 01/05/2022   History of pericarditis 01/05/2022   Hypertension 01/05/2022   LBBB (left bundle branch block) 09/17/2017   Benign essential HTN 10/31/2015   Heart  palpitations 10/31/2015   Lung mass 06/27/2013   Edema of extremities 06/27/2013   Pericarditis, viral    Postoperative atrial fibrillation ([facility])    History of pericardiectomy 06/19/2013   Chronic pulmonary blastomycosis ([place]) 08/23/2012   Pulmonary nodule, right 03/09/2012   Pericardial effusion/ pericarditis 10/06/2011   Home Medication(s) Prior to Admission medications   Medication Sig Start Date End Date Taking? Authorizing Provider  acetaminophen (TYLENOL) 325 MG tablet Take 2 tablets (650 mg total) by mouth every 6 (six) hours as needed for mild pain (or Fever >/= 101). 06/26/22  Yes [name], Henreitta Leber, MD  albuterol (PROVENTIL) (2.5 MG/3ML) 0.083% nebulizer solution Take 3 mLs (2.5 mg total) by nebulization every 2 (two) hours as needed for wheezing or shortness of breath. 06/26/22  Yes [name], Henreitta Leber, MD  bisacodyl (DULCOLAX) 10 MG suppository Place 1 suppository (10 mg total) rectally daily as needed for moderate constipation. 06/26/22  Yes [name], Henreitta Leber, MD  diclofenac Sodium (VOLTAREN) 1 % GEL Apply 2 g topically 4 (four) times daily. Patient taking differently: Apply 2 g topically 4 (four) times daily. 0900, 1300, 1700, 2100 06/26/22  Yes [name], Henreitta Leber, MD  feeding supplement (ENSURE ENLIVE / ENSURE PLUS) LIQD Take 237 mLs by mouth 3 (three) times daily between meals. Patient taking differently: Take 237 mLs by mouth 3 (three) times daily between meals. 1000, 1400, 2200 06/26/22  Yes [name], Henreitta Leber, MD  fluticasone (FLONASE) 50 MCG/ACT nasal spray Place 2 sprays into both nostrils daily. Patient taking differently: Place 2 sprays into both nostrils every morning. 06/26/22  Yes [name], Henreitta Leber, MD  furosemide (LASIX) 40  MG tablet Take 1 tablet (40 mg total) by mouth daily. Patient taking differently: Take 40 mg by mouth every morning. 06/26/22 06/26/23 Yes [name], Henreitta Leber, MD  insulin aspart (NOVOLOG) 100 UNIT/ML injection 0-9 Units, Subcutaneous, 3 times daily with  meals,  CBG < 70: Implement Hypoglycemia measures  CBG 70 - 120: 0 units  CBG 121 - 150: 1 unit  CBG 151 - 200: 2 units  CBG 201 - 250: 3 units  CBG 251 - 300: 5 units  CBG 301 - 350: 7 units  CBG 351 - 400: 9 units  CBG > 400: call MD 06/26/22  Yes [name], Henreitta Leber, MD  magic mouthwash SOLN Take 15 mLs by mouth 4 (four) times daily as needed for mouth pain. Patient taking differently: Take 15 mLs by mouth 4 (four) times daily as needed for mouth pain. 0900, 1300, 1700, 2100 06/26/22  Yes [name], Henreitta Leber, MD  magnesium hydroxide (MILK OF MAGNESIA) 400 MG/5ML suspension Take 15 mLs by mouth daily as needed for mild constipation. 06/26/22  Yes [name], Henreitta Leber, MD  metFORMIN (GLUCOPHAGE-XR) 500 MG 24 hr tablet Take 500 mg by mouth daily with supper.   Yes [provider]  metoprolol succinate (TOPROL-XL) 25 MG 24 hr tablet Take 0.5 tablets (12.5 mg total) by mouth daily. 06/26/22  Yes [name], Henreitta Leber, MD  Multiple Vitamin (MULTIVITAMIN WITH MINERALS) TABS tablet Take 1 tablet by mouth daily. Patient taking differently: Take 1 tablet by mouth every morning. 06/26/22  Yes [name], Henreitta Leber, MD  ondansetron (ZOFRAN) 4 MG tablet Take 4 mg by mouth every 6 (six) hours as needed for nausea or vomiting.   Yes [provider]  OXYGEN Inhale 2 L into the lungs daily.   Yes [provider]  polyethylene glycol (MIRALAX / GLYCOLAX) 17 g packet Take 17 g by mouth daily. Patient taking differently: Take 17 g by mouth every morning. 06/27/22  Yes [name], Henreitta Leber, MD  potassium chloride SA (KLOR-CON M) 20 MEQ tablet Take 1 tablet (20 mEq total) by mouth daily. Patient taking differently: Take 20 mEq by mouth every morning. 06/26/22  Yes [name], Henreitta Leber, MD  Propylene Glycol (SYSTANE COMPLETE) 0.6 % SOLN Place 2 drops into both eyes 3 (three) times daily. 0900, 1300, 1700   Yes [provider]  senna-docusate (SENOKOT-S) 8.6-50 MG tablet Take 1 tablet by mouth at  bedtime. 06/27/22  Yes [name], Henreitta Leber, MD  triamcinolone ointment (KENALOG) 0.5 % Apply 1 Application topically daily as needed (itching). Apply topically to chest every day as needed for itching.   Yes [provider]  Potassium Chloride 10 % SOLN Take 15 mLs by mouth every morning. Mix 15 mL (20 MEQ) with juice and drink by mouth once daily for supplement.    [provider]  Past Surgical History Past Surgical History:  Procedure Laterality Date   ABDOMINAL HYSTERECTOMY  1988   chest tube placement  09/03/11   LUNG REMOVAL, PARTIAL     right knee arthoscopy with meniscal repair     subxiphoid pericardial window and drainage anterior partial pericardiectomy  09/03/11   Family History Family History  Problem Relation Age of Onset   Hypertension Father    Stroke Father    Hypertension Mother    Ovarian cancer Mother    Prostate cancer Brother    Thyroid disease Sister    Hypertension Sister    Leukemia Maternal Aunt     Social History Social History   Tobacco Use   Smoking status: Former    Packs/day: 1.50    Years: 10.00    Total pack years: 15.00    Types: Cigarettes    Quit date: 05/19/1979    Years since quitting: 43.1   Smokeless tobacco: Never  Vaping Use   Vaping Use: Never used  Substance Use Topics   Alcohol use: No   Drug use: No   Allergies Latex, Penicillins, Tape, Z-pak [azithromycin], Eggs or egg-derived products, Amoxil [amoxicillin], and Sulfa antibiotics  Review of Systems Review of Systems  Gastrointestinal:  Positive for abdominal pain, nausea and vomiting.  Psychiatric/Behavioral:  Positive for confusion.     Physical Exam Vital Signs  I have reviewed the triage vital signs BP (!) 159/106   Pulse 97   Temp (!) 96.8 F (36 C) (Axillary)   Resp (!) 21   LMP  (LMP Unknown)   SpO2 95%    Physical Exam Vitals and nursing note reviewed.  Constitutional:      General: She is not in acute distress.    Appearance: She is well-developed.  HENT:     Head: Normocephalic and atraumatic.  Eyes:     Conjunctiva/sclera: Conjunctivae normal.  Cardiovascular:     Rate and Rhythm: Normal rate and regular rhythm.     Heart sounds: No murmur heard. Pulmonary:     Effort: Pulmonary effort is normal. No respiratory distress.     Breath sounds: Normal breath sounds.  Abdominal:     Palpations: Abdomen is soft.     Tenderness: There is abdominal tenderness.  Musculoskeletal:        General: No swelling.     Cervical back: Neck supple.  Skin:    General: Skin is warm and dry.     Capillary Refill: Capillary refill takes less than 2 seconds.  Neurological:     Mental Status: She is alert. She is disoriented.  Psychiatric:        Mood and Affect: Mood normal.     ED Results and Treatments Labs (all labs ordered are listed, but only abnormal results are displayed) Labs Reviewed  CBC WITH DIFFERENTIAL/PLATELET - Abnormal; Notable for the following components:      Result Value   RBC 5.35 (*)    Hemoglobin 15.2 (*)    HCT 46.8 (*)    Monocytes Absolute 1.3 (*)    Abs Immature Granulocytes 0.26 (*)    All other components within normal limits  COMPREHENSIVE METABOLIC PANEL - Abnormal; Notable for the following components:   Glucose, Bld 164 (*)    Total Protein 8.3 (*)    Albumin 2.5 (*)    AST 113 (*)    ALT 74 (*)    GFR, Estimated 58 (*)    All other components within normal limits  I-STAT VENOUS BLOOD GAS, ED - Abnormal; Notable for the following components:   pH, Ven 7.469 (*)    pO2, Ven 23 (*)    Bicarbonate 34.8 (*)    TCO2 36 (*)    Acid-Base Excess 10.0 (*)    All other components within normal limits  RESP PANEL BY RT-PCR (RSV, FLU A&B, COVID)  RVPGX2  CULTURE, BLOOD (ROUTINE X 2)  CULTURE, BLOOD (ROUTINE X 2)  LIPASE, BLOOD  AMMONIA  TSH  MAGNESIUM   PHOSPHORUS  URINALYSIS, ROUTINE W REFLEX MICROSCOPIC  LACTIC ACID, PLASMA  LACTIC ACID, PLASMA  CBC WITH DIFFERENTIAL/PLATELET  COMPREHENSIVE METABOLIC PANEL  MAGNESIUM  PHOSPHORUS  PROCALCITONIN                                                                                                                          Radiology DG Chest Port 1 View  Result Date: 06/30/2022 CLINICAL DATA:  Cough and mental status change EXAM: PORTABLE CHEST 1 VIEW COMPARISON:  Chest x-ray 06/20/2022 FINDINGS: Heart is enlarged, unchanged. There is some minimal strandy and hazy opacities in both lung bases. There is no pneumothorax. There is a small left pleural effusion. No acute fractures are seen. IMPRESSION: 1. Minimal strandy and hazy opacities in both lung bases may represent atelectasis or infection. 2. Small left pleural effusion. 3. Stable cardiomegaly. Electronically Signed   By: Ronney Asters M.D.   On: 06/30/2022 20:30   CT Head Wo Contrast  Result Date: 06/30/2022 CLINICAL DATA:  Progressive lethargy EXAM: CT HEAD WITHOUT CONTRAST TECHNIQUE: Contiguous axial images were obtained from the base of the skull through the vertex without intravenous contrast. RADIATION DOSE REDUCTION: This exam was performed according to the departmental dose-optimization program which includes automated exposure control, adjustment of the mA and/or kV according to patient size and/or use of iterative reconstruction technique. COMPARISON:  CT brain 05/13/2022 FINDINGS: Brain: No acute territorial infarction, hemorrhage, or intracranial mass. Patchy white matter hypodensity consistent with chronic small vessel ischemic change. The ventricles are nonenlarged. Vascular: No hyperdense vessels.  No unexpected calcification. Skull: Normal. Negative for fracture or focal lesion. Sinuses/Orbits: No acute finding. Other: None IMPRESSION: 1. No CT evidence for acute intracranial abnormality. 2. Mild chronic small vessel ischemic changes  of the white matter. Electronically Signed   By: Donavan Foil M.D.   On: 06/30/2022 18:32   CT ABDOMEN PELVIS W CONTRAST  Result Date: 06/30/2022 CLINICAL DATA:  Left lower quadrant abdominal pain. EXAM: CT ABDOMEN AND PELVIS WITH CONTRAST TECHNIQUE: Multidetector CT imaging of the abdomen and pelvis was performed using the standard protocol following bolus administration of intravenous contrast. RADIATION DOSE REDUCTION: This exam was performed according to the departmental dose-optimization program which includes automated exposure control, adjustment of the mA and/or kV according to patient size and/or use of iterative reconstruction technique. CONTRAST:  75mL OMNIPAQUE IOHEXOL 350 MG/ML SOLN COMPARISON:  None Available. FINDINGS: Lower chest: Small right pleural effusion is noted with adjacent subsegmental  atelectasis. Hepatobiliary: No focal liver abnormality is seen. No gallstones, gallbladder wall thickening, or biliary dilatation. Pancreas: Unremarkable. No pancreatic ductal dilatation or surrounding inflammatory changes. Spleen: Normal in size without focal abnormality. Adrenals/Urinary Tract: Adrenal glands are unremarkable. Kidneys are normal, without renal calculi, focal lesion, or hydronephrosis. Bladder is unremarkable. Stomach/Bowel: The stomach and appendix are unremarkable. There is no evidence of bowel obstruction or inflammation. A large amount of stool seen in the rectum concerning for impaction. Vascular/Lymphatic: Aortic atherosclerosis. No enlarged abdominal or pelvic lymph nodes. Reproductive: Status post hysterectomy. No adnexal masses. Other: No abdominal wall hernia or abnormality. No abdominopelvic ascites. Musculoskeletal: No acute or significant osseous findings. IMPRESSION: Small right pleural effusion is noted with adjacent subsegmental atelectasis. Large amount of stool seen in the rectum concerning for impaction. No other significant abnormality seen in the abdomen or pelvis.  Aortic Atherosclerosis (ICD10-I70.0). Electronically Signed   By: Marijo Conception M.D.   On: 06/30/2022 18:31    Pertinent labs & imaging results that were available during my care of the patient were reviewed by me and considered in my medical decision making (see MDM for details).  Medications Ordered in ED Medications  lactated ringers infusion ( Intravenous New Bag/Given 06/30/22 2032)  acetaminophen (TYLENOL) tablet 650 mg (has no administration in time range)    Or  acetaminophen (TYLENOL) suppository 650 mg (has no administration in time range)  melatonin tablet 3 mg (has no administration in time range)  ondansetron (ZOFRAN) injection 4 mg (has no administration in time range)  benzonatate (TESSALON) capsule 200 mg (has no administration in time range)  menthol-cetylpyridinium (CEPACOL) lozenge 3 mg (has no administration in time range)  ondansetron (ZOFRAN) injection 4 mg (4 mg Intravenous Given 06/30/22 1523)  lactated ringers bolus 1,000 mL (1,000 mLs Intravenous New Bag/Given 06/30/22 1526)  iohexol (OMNIPAQUE) 350 MG/ML injection 75 mL (75 mLs Intravenous Contrast Given 06/30/22 1806)                                                                                                                                     Procedures Procedures  (including critical care time)  Medical Decision Making / ED Course   This patient presents to the ED for concern of altered mental status, abdominal pain, vomiting, this involves an extensive number of treatment options, and is a complaint that carries with it a high risk of complications and morbidity.  The differential diagnosis includes constipation, intra-abdominal infection, electrolyte abnormality, UTI, sepsis  MDM: Patient seen emergency room for evaluation of multiple complaints described above.  Physical exam reveals a somnolent appearing patient who will awaken to noxious stimuli only but quickly returns to a state of drowsiness.   Abdominal exam with tenderness to palpation.  At time of signout, patient pending laboratory evaluation and CT imaging.  Please see provider signout for continuation of workup.   Additional history obtained: -Additional history obtained from  daughter -External records from outside source obtained and reviewed including: Chart review including previous notes, labs, imaging, consultation notes   Lab Tests: -I ordered, reviewed, and interpreted labs.   The pertinent results include:   Labs Reviewed  CBC WITH DIFFERENTIAL/PLATELET - Abnormal; Notable for the following components:      Result Value   RBC 5.35 (*)    Hemoglobin 15.2 (*)    HCT 46.8 (*)    Monocytes Absolute 1.3 (*)    Abs Immature Granulocytes 0.26 (*)    All other components within normal limits  COMPREHENSIVE METABOLIC PANEL - Abnormal; Notable for the following components:   Glucose, Bld 164 (*)    Total Protein 8.3 (*)    Albumin 2.5 (*)    AST 113 (*)    ALT 74 (*)    GFR, Estimated 58 (*)    All other components within normal limits  I-STAT VENOUS BLOOD GAS, ED - Abnormal; Notable for the following components:   pH, Ven 7.469 (*)    pO2, Ven 23 (*)    Bicarbonate 34.8 (*)    TCO2 36 (*)    Acid-Base Excess 10.0 (*)    All other components within normal limits  RESP PANEL BY RT-PCR (RSV, FLU A&B, COVID)  RVPGX2  CULTURE, BLOOD (ROUTINE X 2)  CULTURE, BLOOD (ROUTINE X 2)  LIPASE, BLOOD  AMMONIA  TSH  MAGNESIUM  PHOSPHORUS  URINALYSIS, ROUTINE W REFLEX MICROSCOPIC  LACTIC ACID, PLASMA  LACTIC ACID, PLASMA  CBC WITH DIFFERENTIAL/PLATELET  COMPREHENSIVE METABOLIC PANEL  MAGNESIUM  PHOSPHORUS  PROCALCITONIN      EKG   EKG Interpretation  Date/Time:  Tuesday June 30 2022 13:25:23 EST Ventricular Rate:  86 PR Interval:  162 QRS Duration: 130 QT Interval:  387 QTC Calculation: 463 R Axis:   138 Text Interpretation: Sinus rhythm Consider left ventricular hypertrophy Anterior ST elevation,  probably due to LVH LVH consistent with previous tracing. NO STEMI.lateral t wave inversion and ST depression present on previous. Inferior t wave inversion somewhat more pronounced than previous Confirmed by [name] [phone]) on 06/30/2022 7:16:50 PM         Imaging Studies ordered: I ordered imaging studies including CT abdomen pelvis but this is currently pending   Medicines ordered and prescription drug management: Meds ordered this encounter  Medications   ondansetron (ZOFRAN) injection 4 mg   lactated ringers bolus 1,000 mL   iohexol (OMNIPAQUE) 350 MG/ML injection 75 mL   lactated ringers infusion   OR Linked Order Group    acetaminophen (TYLENOL) tablet 650 mg    acetaminophen (TYLENOL) suppository 650 mg   melatonin tablet 3 mg   ondansetron (ZOFRAN) injection 4 mg   benzonatate (TESSALON) capsule 200 mg   menthol-cetylpyridinium (CEPACOL) lozenge 3 mg    -I have reviewed the patients home medicines and have made adjustments as needed  Critical interventions none   Cardiac Monitoring: The patient was maintained on a cardiac monitor.  I personally viewed and interpreted the cardiac monitored which showed an underlying rhythm of: NSR  Social Determinants of Health:  Factors impacting patients care include: none   Reevaluation: After the interventions noted above, I reevaluated the patient and found that they have :stayed the same  Co morbidities that complicate the patient evaluation  Past Medical History:  Diagnosis Date   Benign essential HTN 10/31/2015   Cardiac tamponade due to viral pericarditis    LBBB (left bundle branch block)    Lung  mass    fungal infection s/p resection at [facility]   Postoperative atrial fibrillation [facility])    Pulmonary nodule       Dispostion: I considered admission for this patient, and disposition pending laboratory evaluation and imaging studies.  Please see provider signout for continuation of workup.     Final  Clinical Impression(s) / ED Diagnoses Final diagnoses:  Altered mental status, unspecified altered mental status type  Nausea and vomiting, unspecified vomiting type     @PCDICTATION @    Teressa Lower, MD 06/30/22 2111

## 2022-06-30 NOTE — H&P (Signed)
History and Physical      Barbara Nelson DZH:[id number] DOB: 22-Apr-1941 DOA: 06/30/2022  PCP: Cari Caraway, MD  Patient coming from: home   I have personally briefly reviewed patient's old medical records in [place]  Chief Complaint: Altered mental status  HPI: Barbara Nelson is a 82 y.o. female with medical history significant for type 2 diabetes mellitus, essential hypertension, chronic diastolic heart failure, chronic hypoxic respiratory failure on 2 L continuous nasal cannula, Who is admitted to [facility] on 06/30/2022 with acute metabolic encephalopathy in the setting of HCAP pneumonia after presenting from home to [facility] ED for evaluation of altered mental status.   The patient was recently hospitalized at [facility] from 06/18/2022 to 06/27/2022 with community-acquired pneumonia as well as acute on chronic diastolic heart failure exacerbation.  Her pneumonia was treated with Levaquin, and she was subsequently discharged to home on 06/27/2022.  Subsequently, over the last 2 days, patient's family has noted her to become more lethargic, confused, somnolent, over the timeframe, they also noted her to exhibit evidence of a worsening productive cough and demonstrate evidence of intermittent vomiting, noting 2-3 episodes of nonbloody, nonbilious emesis over the last 48 hours.  In this context, she was brought to [facility] emergency department today for further evaluation management thereof.  Her medical history is notable for chronic diastolic heart failure, with most recent echocardiogram in 2-24 notable for LVEF 55%, no focal wall generalized and mild concentric LVH, grade 1 diastolic percent, normal right ventricular systolic function, mass along the mitral annulus, not associated with any major regurgitation.  In the setting of this chronic diastolic heart failure, she is on 2 L continuous nasal cannula.    ED Course:  Vital signs in the ED were notable for the  following: Afebrile; heart rate 85-1 04; slightly reduced in the 150s to 170s: Respiratory rate 16-25, oxygen saturation 95% on her baseline 2 L continuous nasal cannula.  Labs were notable for the following: CMP notable for the following: Sodium 140, bicarbonate 31, compared medication prior bicarb value of 30 on 06/23/2022, creatinine 0.97 compared to 0.79 on 06/23/2022, BUN/creatinine sugar than 20, with glucose 164, calcium, adjusted for hypoalbuminemia noted to be 11.5, albumin 2.5, alkaline phosphatase into bilirubin were found to be within the limits.  AST 113 compared to 56 on 06/20/2022, ALT 74 compared to 36 on 06/20/2022.  Lipase 24.  CBC notable for evidence of count 7200, hemoglobin 15.2 compared to 12.4 on 06/19/2022, platelet 192.  Urinalysis has been ordered, the result currently pending.  Ammonia and TSH levels currently pending.  Blood cultures x 2 collected prior to initiation of any IV antibiotics.  COVID-19, RSV, influenza PCR all negative.  Per my interpretation, EKG in ED demonstrated the following: EKG, in comparison to most recent prior EKG from 06/18/2022 shows sinus rhythm with heart rate86, T wave version in leads III, aVF, and V6, which you inversion in V6 of his also seen on most recent prior EKG, less than 1 mm ST depression in lead III, unchanged from his recent prior EKG, ST elevation limited to V2, unchanged from most recent prior EKG.  Overall, EKG shows sinus rhythm with nonspecific T wave inversion in leads III and aVF relative to patient's prior EKG.   Imaging and additional notable ED work-up: Chest x-ray, comparison images  06/20/2022 shows interval development of airspace densities in the bilateral lower lobes concerning for infection, will is a showing small left pleural effusion, while demonstrating  no evidence of edema or pneumothorax.  CT head showed no evidence of acute intracranial process, including evidence of discrete hemorrhage or any evidence of acute infarct.  CT  abdomen/pelvis shows evidence of large amount of stool in the rectum without corresponding evidence of obstruction, otherwise showing no evidence of acute intra-abdominal or intrapelvic process.  While in the ED, the following were administered: Zofran 4 mg IV x 1, lactated Ringer's x 1 L bolus followed by continuous LR running at 125 cc/h, sepsis enema, additionally EDP performed manual rectal disimpaction.  Subsequently, the patient was admitted for further evaluation and management presenting acute metabolic encephalopathy in the setting of HCAP pneumonia, with presentation also notable for dehydration, large stool in the rectum, and laboratory abnormalities that include hypercalcemia, acute transaminitis, and acute prerenal azotemia.     Review of Systems: As per HPI otherwise 10 point review of systems negative.   Past Medical History:  Diagnosis Date   Benign essential HTN 10/31/2015   Cardiac tamponade due to viral pericarditis    LBBB (left bundle branch block)    Lung mass    fungal infection s/p resection at [facility]   Postoperative atrial fibrillation [facility])    Pulmonary nodule     Past Surgical History:  Procedure Laterality Date   ABDOMINAL HYSTERECTOMY  1988   chest tube placement  09/03/11   LUNG REMOVAL, PARTIAL     right knee arthoscopy with meniscal repair     subxiphoid pericardial window and drainage anterior partial pericardiectomy  09/03/11    Social History:  reports that she quit smoking about 43 years ago. Her smoking use included cigarettes. She has a 15.00 pack-year smoking history. She has never used smokeless tobacco. She reports that she does not drink alcohol and does not use drugs.   Allergies  Allergen Reactions   Latex Rash   Penicillins Rash and Itching   Tape Hives and Rash   Z-Pak [Azithromycin] Rash and Other (See Comments)    Mouth sores   Eggs Or Egg-Derived Products Nausea And Vomiting   Amoxil [Amoxicillin] Rash   Sulfa Antibiotics Rash     Family History  Problem Relation Age of Onset   Hypertension Father    Stroke Father    Hypertension Mother    Ovarian cancer Mother    Prostate cancer Brother    Thyroid disease Sister    Hypertension Sister    Leukemia Maternal Aunt     Family history reviewed and not pertinent    Prior to Admission medications   Medication Sig Start Date End Date Taking? Authorizing Provider  acetaminophen (TYLENOL) 325 MG tablet Take 2 tablets (650 mg total) by mouth every 6 (six) hours as needed for mild pain (or Fever >/= 101). 06/26/22  Yes [name], Henreitta Leber, MD  albuterol (PROVENTIL) (2.5 MG/3ML) 0.083% nebulizer solution Take 3 mLs (2.5 mg total) by nebulization every 2 (two) hours as needed for wheezing or shortness of breath. 06/26/22  Yes [name], Henreitta Leber, MD  bisacodyl (DULCOLAX) 10 MG suppository Place 1 suppository (10 mg total) rectally daily as needed for moderate constipation. 06/26/22  Yes [name], Henreitta Leber, MD  diclofenac Sodium (VOLTAREN) 1 % GEL Apply 2 g topically 4 (four) times daily. Patient taking differently: Apply 2 g topically 4 (four) times daily. 0900, 1300, 1700, 2100 06/26/22  Yes [name], Henreitta Leber, MD  feeding supplement (ENSURE ENLIVE / ENSURE PLUS) LIQD Take 237 mLs by mouth 3 (three) times daily between meals. Patient taking  differently: Take 237 mLs by mouth 3 (three) times daily between meals. 1000, 1400, 2200 06/26/22  Yes [name], Henreitta Leber, MD  fluticasone (FLONASE) 50 MCG/ACT nasal spray Place 2 sprays into both nostrils daily. Patient taking differently: Place 2 sprays into both nostrils every morning. 06/26/22  Yes [name], Henreitta Leber, MD  furosemide (LASIX) 40 MG tablet Take 1 tablet (40 mg total) by mouth daily. Patient taking differently: Take 40 mg by mouth every morning. 06/26/22 06/26/23 Yes [name], Henreitta Leber, MD  insulin aspart (NOVOLOG) 100 UNIT/ML injection 0-9 Units, Subcutaneous, 3 times daily with meals,  CBG < 70: Implement Hypoglycemia measures  CBG  70 - 120: 0 units  CBG 121 - 150: 1 unit  CBG 151 - 200: 2 units  CBG 201 - 250: 3 units  CBG 251 - 300: 5 units  CBG 301 - 350: 7 units  CBG 351 - 400: 9 units  CBG > 400: call MD 06/26/22  Yes [name], Henreitta Leber, MD  magic mouthwash SOLN Take 15 mLs by mouth 4 (four) times daily as needed for mouth pain. Patient taking differently: Take 15 mLs by mouth 4 (four) times daily as needed for mouth pain. 0900, 1300, 1700, 2100 06/26/22  Yes [name], Henreitta Leber, MD  magnesium hydroxide (MILK OF MAGNESIA) 400 MG/5ML suspension Take 15 mLs by mouth daily as needed for mild constipation. 06/26/22  Yes [name], Henreitta Leber, MD  metFORMIN (GLUCOPHAGE-XR) 500 MG 24 hr tablet Take 500 mg by mouth daily with supper.   Yes [provider]  metoprolol succinate (TOPROL-XL) 25 MG 24 hr tablet Take 0.5 tablets (12.5 mg total) by mouth daily. 06/26/22  Yes [name], Henreitta Leber, MD  Multiple Vitamin (MULTIVITAMIN WITH MINERALS) TABS tablet Take 1 tablet by mouth daily. Patient taking differently: Take 1 tablet by mouth every morning. 06/26/22  Yes [name], Henreitta Leber, MD  ondansetron (ZOFRAN) 4 MG tablet Take 4 mg by mouth every 6 (six) hours as needed for nausea or vomiting.   Yes [provider]  OXYGEN Inhale 2 L into the lungs daily.   Yes [provider]  polyethylene glycol (MIRALAX / GLYCOLAX) 17 g packet Take 17 g by mouth daily. Patient taking differently: Take 17 g by mouth every morning. 06/27/22  Yes [name], Henreitta Leber, MD  potassium chloride SA (KLOR-CON M) 20 MEQ tablet Take 1 tablet (20 mEq total) by mouth daily. Patient taking differently: Take 20 mEq by mouth every morning. 06/26/22  Yes [name], Henreitta Leber, MD  Propylene Glycol (SYSTANE COMPLETE) 0.6 % SOLN Place 2 drops into both eyes 3 (three) times daily. 0900, 1300, 1700   Yes [provider]  senna-docusate (SENOKOT-S) 8.6-50 MG tablet Take 1 tablet by mouth at bedtime. 06/27/22  Yes [name], Henreitta Leber, MD  triamcinolone  ointment (KENALOG) 0.5 % Apply 1 Application topically daily as needed (itching). Apply topically to chest every day as needed for itching.   Yes [provider]  Potassium Chloride 10 % SOLN Take 15 mLs by mouth every morning. Mix 15 mL (20 MEQ) with juice and drink by mouth once daily for supplement.    [provider]     Objective    Physical Exam: Vitals:   06/30/22 2030 06/30/22 2100 06/30/22 2118 06/30/22 2130  BP: (!) 149/86 139/79  (!) 143/84  Pulse: 92 99  98  Resp: 15 (!) 23  20  Temp:   97.6 F (36.4 C)   TempSrc:   Oral  SpO2: 99% 93%  98%    General: appears to be stated age; lethargic, confused Skin: warm, dry, no rash Head:  AT/[place] Mouth:  Oral mucosa membranes appear dry, normal dentition Neck: supple; trachea midline Heart:  RRR; did not appreciate any M/R/G Lungs: Bibasilar crackles noted, otherwise, CTAB, did not appreciate any wheezes, or rhonchi Abdomen: + BS; soft, ND, NT Vascular: 2+ pedal pulses b/l; 2+ radial pulses b/l Extremities: no peripheral edema, no muscle wasting Neuro: strength and sensation intact in upper and lower extremities b/l    Labs on Admission: I have personally reviewed following labs and imaging studies  CBC: Recent Labs  Lab 06/30/22 1406 06/30/22 2000  WBC 7.2  --   NEUTROABS 4.7  --   HGB 15.2* 15.0  HCT 46.8* 44.0  MCV 87.5  --   PLT 192  --    Basic Metabolic Panel: Recent Labs  Lab 06/30/22 1406 06/30/22 1938 06/30/22 2000  NA 140  --  140  K 4.5  --  4.3  CL 98  --   --   CO2 31  --   --   GLUCOSE 164*  --   --   BUN 20  --   --   CREATININE 0.97  --   --   CALCIUM 10.3  --   --   MG  --  2.3  --   PHOS  --  3.1  --    GFR: Estimated Creatinine Clearance: 52 mL/min (by C-G formula based on SCr of 0.97 mg/dL). Liver Function Tests: Recent Labs  Lab 06/30/22 1406  AST 113*  ALT 74*  ALKPHOS 89  BILITOT 0.5  PROT 8.3*  ALBUMIN 2.5*   Recent Labs  Lab 06/30/22 1406   LIPASE 24   Recent Labs  Lab 06/30/22 1952  AMMONIA 15   Coagulation Profile: No results for input(s): "INR", "PROTIME" in the last 168 hours. Cardiac Enzymes: No results for input(s): "CKTOTAL", "CKMB", "CKMBINDEX", "TROPONINI" in the last 168 hours. BNP (last 3 results) No results for input(s): "PROBNP" in the last 8760 hours. HbA1C: No results for input(s): "HGBA1C" in the last 72 hours. CBG: Recent Labs  Lab 06/25/22 1549 06/25/22 2107 06/26/22 0845 06/26/22 1214 06/26/22 1539  GLUCAP 159* 115* 138* 150* 118*   Lipid Profile: No results for input(s): "CHOL", "HDL", "LDLCALC", "TRIG", "CHOLHDL", "LDLDIRECT" in the last 72 hours. Thyroid Function Tests: Recent Labs    06/30/22 1952  TSH 1.727   Anemia Panel: No results for input(s): "VITAMINB12", "FOLATE", "FERRITIN", "TIBC", "IRON", "RETICCTPCT" in the last 72 hours. Urine analysis:    Component Value Date/Time   COLORURINE YELLOW 06/19/2022 1456   APPEARANCEUR HAZY (A) 06/19/2022 1456   LABSPEC 1.012 06/19/2022 1456   PHURINE 7.0 06/19/2022 1456   GLUCOSEU NEGATIVE 06/19/2022 1456   HGBUR NEGATIVE 06/19/2022 1456   BILIRUBINUR NEGATIVE 06/19/2022 1456   KETONESUR NEGATIVE 06/19/2022 1456   PROTEINUR 100 (A) 06/19/2022 1456   UROBILINOGEN 0.2 09/15/2011 0530   NITRITE NEGATIVE 06/19/2022 1456   LEUKOCYTESUR NEGATIVE 06/19/2022 1456    Radiological Exams on Admission: DG Chest Port 1 View  Result Date: 06/30/2022 CLINICAL DATA:  Cough and mental status change EXAM: PORTABLE CHEST 1 VIEW COMPARISON:  Chest x-ray 06/20/2022 FINDINGS: Heart is enlarged, unchanged. There is some minimal strandy and hazy opacities in both lung bases. There is no pneumothorax. There is a small left pleural effusion. No acute fractures are seen. IMPRESSION: 1. Minimal strandy and hazy opacities  in both lung bases may represent atelectasis or infection. 2. Small left pleural effusion. 3. Stable cardiomegaly. Electronically Signed    By: Ronney Asters M.D.   On: 06/30/2022 20:30   CT Head Wo Contrast  Result Date: 06/30/2022 CLINICAL DATA:  Progressive lethargy EXAM: CT HEAD WITHOUT CONTRAST TECHNIQUE: Contiguous axial images were obtained from the base of the skull through the vertex without intravenous contrast. RADIATION DOSE REDUCTION: This exam was performed according to the departmental dose-optimization program which includes automated exposure control, adjustment of the mA and/or kV according to patient size and/or use of iterative reconstruction technique. COMPARISON:  CT brain 05/13/2022 FINDINGS: Brain: No acute territorial infarction, hemorrhage, or intracranial mass. Patchy white matter hypodensity consistent with chronic small vessel ischemic change. The ventricles are nonenlarged. Vascular: No hyperdense vessels.  No unexpected calcification. Skull: Normal. Negative for fracture or focal lesion. Sinuses/Orbits: No acute finding. Other: None IMPRESSION: 1. No CT evidence for acute intracranial abnormality. 2. Mild chronic small vessel ischemic changes of the white matter. Electronically Signed   By: Donavan Foil M.D.   On: 06/30/2022 18:32   CT ABDOMEN PELVIS W CONTRAST  Result Date: 06/30/2022 CLINICAL DATA:  Left lower quadrant abdominal pain. EXAM: CT ABDOMEN AND PELVIS WITH CONTRAST TECHNIQUE: Multidetector CT imaging of the abdomen and pelvis was performed using the standard protocol following bolus administration of intravenous contrast. RADIATION DOSE REDUCTION: This exam was performed according to the departmental dose-optimization program which includes automated exposure control, adjustment of the mA and/or kV according to patient size and/or use of iterative reconstruction technique. CONTRAST:  5mL OMNIPAQUE IOHEXOL 350 MG/ML SOLN COMPARISON:  None Available. FINDINGS: Lower chest: Small right pleural effusion is noted with adjacent subsegmental atelectasis. Hepatobiliary: No focal liver abnormality is seen. No  gallstones, gallbladder wall thickening, or biliary dilatation. Pancreas: Unremarkable. No pancreatic ductal dilatation or surrounding inflammatory changes. Spleen: Normal in size without focal abnormality. Adrenals/Urinary Tract: Adrenal glands are unremarkable. Kidneys are normal, without renal calculi, focal lesion, or hydronephrosis. Bladder is unremarkable. Stomach/Bowel: The stomach and appendix are unremarkable. There is no evidence of bowel obstruction or inflammation. A large amount of stool seen in the rectum concerning for impaction. Vascular/Lymphatic: Aortic atherosclerosis. No enlarged abdominal or pelvic lymph nodes. Reproductive: Status post hysterectomy. No adnexal masses. Other: No abdominal wall hernia or abnormality. No abdominopelvic ascites. Musculoskeletal: No acute or significant osseous findings. IMPRESSION: Small right pleural effusion is noted with adjacent subsegmental atelectasis. Large amount of stool seen in the rectum concerning for impaction. No other significant abnormality seen in the abdomen or pelvis. Aortic Atherosclerosis (ICD10-I70.0). Electronically Signed   By: Marijo Conception M.D.   On: 06/30/2022 18:31      Assessment/Plan   Principal Problem:   Acute metabolic encephalopathy Active Problems:   Essential hypertension   HCAP (healthcare-associated pneumonia)   Hypercalcemia   Dehydration   Acute prerenal azotemia   Transaminitis   Constipation   DM2 (diabetes mellitus, type 2) (HCC)   Allergic rhinitis   Chronic diastolic CHF (congestive heart failure) (HCC)      #) Acute metabolic encephalopathy: 2 days of worsening lethargy, confusion, somnolence, which appears to be on the basis of physiologic stressors stemming from presenting HCAP pneumonia, as further detailed below.  No obvious additional contributory underlying infectious process at this time, including negative COVID-19/influenza, RSV, influenza PCR performed potential additional metabolic  contributing factors include clinical evidence of dehydration, stability scribed below, today, will noting that resulted presenting urinalysis is pending  at this time  As well as finding of hypercalcemia.   No additional overt metabolic/electrolyte contributions at this time.  No obvious contributory pharmacologic factors. No overt acute focal neurologic deficits to suggest a contribution from an underlying acute CVA, while noting that CTh is noted subacute process.  EDP has ordered MRI brain to further evaluate, which is currently pending.. Seizures are also felt to be less likely. Will check VBG to evaluate for any contribution from hypercapnic encephalopathy.   Plan: fall precautions. Repeat CMP/CBC in the AM. Check magnesium level. check TSH, vbg .  Check ammonia level.  Follow-up for urinalysis.  Further evaluation management present HCAP pneumonia, as well as hypercalcemia and dehydration, as further detailed below.  Gentle IV fluids, as outlined below.  Check INR.  In the setting of acute encephalopathy and mild transaminitis, will also add on CPK level.  Fall precautions ordered.  Delirium precautions ordered.          #) HCAP pneumonia: Diagnosed on the basis of 2 days of worsening productive cough associated with increase in lethargy, with presenting chest x-ray showing interval development of bibasilar airspace opacities concerning for infection, will otherwise showing no evidence of acute cardiopulmonary process, including no evidence of edema or pneumothorax.  As the patient was just hospitalized within the last few weeks during which time she received Levaquin, criteria.  Met for the patient's pneumonia to be considered HCAP in nature, warranting a expansion of antibiotic coverage to include potential resistant microbes, including MRSA coverage as well as antipseudomonal coverage.  Of note, in the absence of fever or leukocytosis, source criteria not currently met for sepsis.  Blood  cultures x 2 were collected in the ED this evening.  Will initiate IV vancomycin and cefepime to provide aforementioned broad additionally, given presenting-spectrum coverage.  Acute transaminitis is well as she has symptoms in the form of nausea/vomiting, will also add on Legionella urine antigen.   Plan: Reduce IV fluids to 75 cc/h.  Follow for results blood cultures x 2.  Initiate IV vancomycin and cefepime, as above. Check MRSA PCR, which, if negative, can consider discontinuation of IV vancomycin given the high negative predictive value for MRSA pneumonia associated with such as result.  Add on procalcitonin level, Legionella urine antigen, strep pneumonia urine antigen.  Flutter valve.  Prn Tessalon Perles.  Repeat CBC with differential as well as CMP in the morning.            #) Hypercalcemia: Presenting labs reflect corrected serum calcium level of 11.5.  Suspect element of dehydration, without overt pharmacologic contribution. Will provide gentle IVF's, as above, with repeat calcium level in the morning, with consideration for further expansion of work-up if no ensuing improvement in serum calcium level following interval IVF administration.    Plan: LR, as above.  Monitor strict I's&O's, daily weights.  CMP in the morning.  Check serum Mg and Phos levels.  Further evaluation management of presenting clinical evidence of dehydration, as further detailed below.                     #) Dehydration: Clinical suspicion for such, including the appearance of dry oral mucous membranes as well as laboratory findings notable for acute prerenal azotemia, interval increase in serum bicarbonate, suggestive of an element of contraction alkalosis, as well as increase in hemoglobin level by nearly 3 points, consistent with an element of hemoconcentration and this overall clinical picture of dehydration.  Of the patient's diminished  oral intake over the last few days, in the setting of  progressive lethargy over that time. No e/o associated hypotension.   Plan: Monitor strict I's and O's.  Daily weights.  Repeat BMP in the morning. IVF's in form of, as above. Further evaluation and management of associated hypercalcemia, as outlined above.              #) Acute transaminitis: Mild interval elevation in transaminases, as defined above, in the absence of any corresponding elevation in alkaline phosphatase or bilirubin to suggest a cholestatic pattern.  Of note, CT abdomen/pelvis, with the exception of showing large stool volume in the rectum, otherwise shows no evidence of acute intra-abdominal or intrapelvic process, including no evidence of biliary obstruction nor any radiographic evidence to suggest acute cholecystitis.  Etiology for patient's acute transaminitis not highly clear at this time, although will provide further diagnostic evaluation that includes ruling out rhabdomyolysis in the context concomitant presenting acute encephalopathy, also checking Legionella urine antigen given presenting atypical pneumonia with corresponding acute gastrointestinal symptoms.  No evidence of underlying viral process at this time.  Not on a statin as an outpatient.   Plan: Repeat CMP in the morning.  Add on Legionella urine antigen, CPK.  Check INR, acetaminophen level.  Check urinary drug screen           #) Large stool volume and rectum: As noted on presenting CT abdomen/pelvis in the absence of any corresponding evidence obstruction.  Suspect information from clinical evidence of dehydration, as explained above.  This is status post manual disimpaction performed by EDP in the ED today as well as status post soapsuds enema.  Outpatient oral bowel regimen includes Senokot daily, prn Dulcolax suppository as well as daily MiraLAX.  It appears that there is room for optimization from the standpoint of this oral and suppository bowel regimen to help promote additional gut  motility.  Suspect additional contribution from presenting hypercalcemia, as above.  Plan: Escalate Senokot from daily to twice daily dosing.  Continue outpatient prn to collect suppository.  Continue daily MiraLAX, with consideration for increase to twice daily dosing.  Substance enema x 1 additional dose ordered for the morning.  Further evaluation management of dehydration, as above.  IV fluids, as above.  Further evaluation management of hypercalcemia, as above.                #) Type 2 Diabetes Mellitus: documented history of such. Home insulin regimen: Sliding scale NovoLog 3 times daily with meals. Home oral hypoglycemic agents: Metformin. presenting blood sugar: 164. Most recent A1c noted to be 6.9% when checked on 06/19/2022.  Plan: accuchecks QAC and HS with low dose SSI. hold home oral hypoglycemic agents during this hospitalization.           #) Essential Hypertension: documented h/o such, with outpatient antihypertensive regimen including metoprolol succinate as well as Lasix.  SBP's in the ED today: 150s to 170s mmHg.   Plan: Close monitoring of subsequent BP via routine VS. continue home metoprolol succinate.  In the setting of clinical evidence of dehydration, will hold home Lasix for now.              #) Allergic Rhinitis: documented h/o such, on scheduled intranasal Flonase as outpatient.    Plan: cont home Flonase.                 #) Chronic diastolic heart failure: documented history of such, with most recent echocardiogram performed on 06/19/2022  notable for LVEF 50 to 55% and grade 1 diastolic dysfunction, with additional details as conveyed above. No clinical or evidence to suggest acutely decompensated heart failure at this time.  Rather, clinically, the patient appears dehydrated at this time,  Noting evidence of bibasilar infiltrates consistent with infection on present chest x-ray, in the absence of any corresponding evidence of  pulmonary or interstitial edema .  Will also assess with BNP level.  Home diuretic regimen reportedly consists of the following: Lasix 40 mg p.o. daily, which will be held for now in the setting of clinical evidence of dehydration..    Plan: monitor strict I's & O's and daily weights. Repeat CMP in AM. Check serum mag level.  Hold home diuretic regimen for now, as above.  Add on BMP.       DVT prophylaxis: SCD's   Code Status: Full code Family Communication: none Disposition Plan: Per Rounding Team Consults called: none;  Admission status: Inpatient     I SPENT GREATER THAN 75  MINUTES IN CLINICAL CARE TIME/MEDICAL DECISION-MAKING IN COMPLETING THIS ADMISSION.      [place] DO [facility]  From [place]   06/30/2022, 9:46 PM

## 2022-06-30 NOTE — ED Notes (Signed)
RN and NT attempted to in and out cath pt. Pts diaper was wet. Cath was unsuccessful.  MD notified.

## 2022-07-01 ENCOUNTER — Encounter (HOSPITAL_COMMUNITY): Payer: Self-pay | Admitting: Student

## 2022-07-01 ENCOUNTER — Inpatient Hospital Stay (HOSPITAL_COMMUNITY): Payer: Medicare Other

## 2022-07-01 DIAGNOSIS — R1110 Vomiting, unspecified: Secondary | ICD-10-CM

## 2022-07-01 DIAGNOSIS — I5032 Chronic diastolic (congestive) heart failure: Secondary | ICD-10-CM | POA: Diagnosis not present

## 2022-07-01 DIAGNOSIS — I4891 Unspecified atrial fibrillation: Secondary | ICD-10-CM | POA: Insufficient documentation

## 2022-07-01 DIAGNOSIS — G9341 Metabolic encephalopathy: Secondary | ICD-10-CM | POA: Diagnosis not present

## 2022-07-01 DIAGNOSIS — N19 Unspecified kidney failure: Secondary | ICD-10-CM | POA: Diagnosis not present

## 2022-07-01 DIAGNOSIS — R531 Weakness: Secondary | ICD-10-CM

## 2022-07-01 DIAGNOSIS — K5900 Constipation, unspecified: Secondary | ICD-10-CM | POA: Diagnosis not present

## 2022-07-01 LAB — URINALYSIS, ROUTINE W REFLEX MICROSCOPIC
Bacteria, UA: NONE SEEN
Bilirubin Urine: NEGATIVE
Glucose, UA: NEGATIVE mg/dL
Hgb urine dipstick: NEGATIVE
Ketones, ur: 5 mg/dL — AB
Leukocytes,Ua: NEGATIVE
Nitrite: NEGATIVE
Protein, ur: >=300 mg/dL — AB
Specific Gravity, Urine: 1.038 — ABNORMAL HIGH (ref 1.005–1.030)
pH: 5 (ref 5.0–8.0)

## 2022-07-01 LAB — I-STAT VENOUS BLOOD GAS, ED
Acid-Base Excess: 7 mmol/L — ABNORMAL HIGH (ref 0.0–2.0)
Bicarbonate: 32.7 mmol/L — ABNORMAL HIGH (ref 20.0–28.0)
Calcium, Ion: 1.29 mmol/L (ref 1.15–1.40)
HCT: 41 % (ref 36.0–46.0)
Hemoglobin: 13.9 g/dL (ref 12.0–15.0)
O2 Saturation: 79 %
Potassium: 3.7 mmol/L (ref 3.5–5.1)
Sodium: 144 mmol/L (ref 135–145)
TCO2: 34 mmol/L — ABNORMAL HIGH (ref 22–32)
pCO2, Ven: 48.1 mmHg (ref 44–60)
pH, Ven: 7.44 — ABNORMAL HIGH (ref 7.25–7.43)
pO2, Ven: 42 mmHg (ref 32–45)

## 2022-07-01 LAB — COMPREHENSIVE METABOLIC PANEL
ALT: 45 U/L — ABNORMAL HIGH (ref 0–44)
AST: 72 U/L — ABNORMAL HIGH (ref 15–41)
Albumin: 2 g/dL — ABNORMAL LOW (ref 3.5–5.0)
Alkaline Phosphatase: 67 U/L (ref 38–126)
Anion gap: 8 (ref 5–15)
BUN: 19 mg/dL (ref 8–23)
CO2: 29 mmol/L (ref 22–32)
Calcium: 9.6 mg/dL (ref 8.9–10.3)
Chloride: 101 mmol/L (ref 98–111)
Creatinine, Ser: 0.81 mg/dL (ref 0.44–1.00)
GFR, Estimated: >60 mL/min (ref >60)
Glucose, Bld: 136 mg/dL — ABNORMAL HIGH (ref 70–99)
Potassium: 4 mmol/L (ref 3.5–5.1)
Sodium: 138 mmol/L (ref 135–145)
Total Bilirubin: 1.4 mg/dL — ABNORMAL HIGH (ref 0.3–1.2)
Total Protein: 6.8 g/dL (ref 6.5–8.1)

## 2022-07-01 LAB — CBC WITH DIFFERENTIAL/PLATELET
Abs Immature Granulocytes: 0.27 10*3/uL — ABNORMAL HIGH (ref 0.00–0.07)
Basophils Absolute: 0 10*3/uL (ref 0.0–0.1)
Basophils Relative: 0 %
Eosinophils Absolute: 0 10*3/uL (ref 0.0–0.5)
Eosinophils Relative: 0 %
HCT: 40.5 % (ref 36.0–46.0)
Hemoglobin: 13.1 g/dL (ref 12.0–15.0)
Immature Granulocytes: 3 %
Lymphocytes Relative: 11 %
Lymphs Abs: 1 10*3/uL (ref 0.7–4.0)
MCH: 28.2 pg (ref 26.0–34.0)
MCHC: 32.3 g/dL (ref 30.0–36.0)
MCV: 87.1 fL (ref 80.0–100.0)
Monocytes Absolute: 1.9 10*3/uL — ABNORMAL HIGH (ref 0.1–1.0)
Monocytes Relative: 20 %
Neutro Abs: 6.1 10*3/uL (ref 1.7–7.7)
Neutrophils Relative %: 66 %
Platelets: 223 10*3/uL (ref 150–400)
RBC: 4.65 MIL/uL (ref 3.87–5.11)
RDW: 15 % (ref 11.5–15.5)
WBC: 9.3 10*3/uL (ref 4.0–10.5)
nRBC: 0 % (ref 0.0–0.2)

## 2022-07-01 LAB — MAGNESIUM: Magnesium: 2.3 mg/dL (ref 1.7–2.4)

## 2022-07-01 LAB — RAPID URINE DRUG SCREEN, HOSP PERFORMED
Amphetamines: NOT DETECTED
Barbiturates: NOT DETECTED
Benzodiazepines: NOT DETECTED
Cocaine: NOT DETECTED
Opiates: NOT DETECTED
Tetrahydrocannabinol: NOT DETECTED

## 2022-07-01 LAB — CBG MONITORING, ED
Glucose-Capillary: 133 mg/dL — ABNORMAL HIGH (ref 70–99)
Glucose-Capillary: 124 mg/dL — ABNORMAL HIGH (ref 70–99)

## 2022-07-01 LAB — PHOSPHORUS: Phosphorus: 2.7 mg/dL (ref 2.5–4.6)

## 2022-07-01 LAB — LACTIC ACID, PLASMA: Lactic Acid, Venous: 2.1 mmol/L (ref 0.5–1.9)

## 2022-07-01 LAB — MRSA NEXT GEN BY PCR, NASAL: MRSA by PCR Next Gen: NOT DETECTED

## 2022-07-01 LAB — PROTIME-INR
INR: 1 (ref 0.8–1.2)
Prothrombin Time: 13.2 seconds (ref 11.4–15.2)

## 2022-07-01 LAB — GLUCOSE, CAPILLARY: Glucose-Capillary: 154 mg/dL — ABNORMAL HIGH (ref 70–99)

## 2022-07-01 MED ORDER — MAGIC MOUTHWASH W/LIDOCAINE
5.0000 mL | Freq: Four times a day (QID) | ORAL | Status: AC
  Administered 2022-07-02 – 2022-07-06 (×15): 5 mL via ORAL
  Filled 2022-07-01 (×20): qty 5

## 2022-07-01 MED ORDER — ENOXAPARIN SODIUM 40 MG/0.4ML IJ SOSY
40.0000 mg | PREFILLED_SYRINGE | INTRAMUSCULAR | Status: DC
  Administered 2022-07-01 – 2022-07-04 (×4): 40 mg via SUBCUTANEOUS
  Filled 2022-07-01 (×3): qty 0.4

## 2022-07-01 MED ORDER — SODIUM CHLORIDE 0.9 % IV SOLN
2.0000 g | Freq: Three times a day (TID) | INTRAVENOUS | Status: DC
  Administered 2022-07-01: 2 g via INTRAVENOUS
  Filled 2022-07-01: qty 12.5

## 2022-07-01 MED ORDER — IPRATROPIUM-ALBUTEROL 0.5-2.5 (3) MG/3ML IN SOLN
3.0000 mL | Freq: Four times a day (QID) | RESPIRATORY_TRACT | Status: DC | PRN
  Filled 2022-07-01: qty 3

## 2022-07-01 MED ORDER — BISACODYL 10 MG RE SUPP
10.0000 mg | Freq: Every day | RECTAL | Status: DC
  Administered 2022-07-02 – 2022-07-14 (×8): 10 mg via RECTAL
  Filled 2022-07-01 (×9): qty 1

## 2022-07-01 MED ORDER — MILK AND MOLASSES ENEMA: 1.0000 | RECTAL | Status: DC | PRN

## 2022-07-01 MED ORDER — GUAIFENESIN ER 600 MG PO TB12: 600.0000 mg | ORAL_TABLET | Freq: Two times a day (BID) | ORAL | Status: DC

## 2022-07-01 MED ORDER — POLYETHYLENE GLYCOL 3350 17 G PO PACK
17.0000 g | PACK | Freq: Two times a day (BID) | ORAL | Status: DC
  Administered 2022-07-03 – 2022-07-14 (×14): 17 g via ORAL
  Filled 2022-07-01 (×20): qty 1

## 2022-07-01 MED ORDER — LACTATED RINGERS IV SOLN: INTRAVENOUS | Status: DC

## 2022-07-01 NOTE — Progress Notes (Signed)
Attempted to give night time medication, patient is too lethargic to take medication by mouth at this time, notified Dr. Marlowe Sax.

## 2022-07-01 NOTE — Progress Notes (Signed)
PROGRESS NOTE  Barbara Nelson QAS:[id number] DOB: 10-03-1940   PCP: Cari Caraway, MD  Patient is from: SNF  DOA: 06/30/2022 LOS: 1  Chief complaints Chief Complaint  Patient presents with   Fatigue   Abdominal Pain   Emesis     Brief Narrative / Interim history: 82 year old F with PMH of diastolic CHF, chronic hypoxic RF on 2 L, HTN, and recent hospitalization from 2/1-10 with CAP and acute on chronic CHF returning with altered mental status, lethargy and vomiting, and admitted with working diagnosis of HCAP and encephalopathy.  In ED, slightly hypertensive.  Not febrile.  Saturating in upper 90s to 100 on 2 L.  No leukocytosis.  Hgb 15.2 (12.4 about 12 days ago).  Slightly elevated LFT and creatinine.  Lipase, BNP, ammonia, TSH, CK and procalcitonin within normal.  Lactic acid 2.1.  VBG suggess mild metabolic alkalosis.  UDS negative.  UA with > 300 protein.  XR with minimal strandy and hazy opacity at both lung bases suggesting atelectasis or infection, small left pleural effusion and stable cardiomegaly.  CT abdomen and pelvis with contrast showed large amount of stool in the rectum concerning for impaction.  CT head without acute finding.  MRI brain without acute finding but limited by motion.  Patient was started on IVF, IV cefepime and vancomycin and admitted with working diagnosis of HCAP, encephalopathy and dehydration.  The next day, patient remains encephalopathic and lethargic.  She wakes to voice.  She is oriented to self and daughter and follows some commands.  Hemodynamic stable.  Afebrile.   Subjective: Seen and examined earlier this morning.  No major events overnight of this morning.  No major events overnight of this morning.  Family noted increased cough since hospitalization.  Patient denies pain but looks uncomfortable in bed.  Not a great historian.  She responds no to pain, shortness of breath, GI or UTI symptoms.   Objective: Vitals:   07/01/22 1045 07/01/22  1130 07/01/22 1135 07/01/22 1215  BP: 132/79 (!) 142/84  (!) 148/89  Pulse: 97 96  93  Resp: 17 20  10   Temp:   97.8 F (36.6 C)   TempSrc:   Axillary   SpO2: 97% 100%  97%    Examination:  GENERAL: Somewhat uncomfortable in ED stretcher bed. HEENT: MMM.  Vision and hearing grossly intact.  NECK: Supple.  No apparent JVD.  RESP:  No IWOB.  Fair aeration bilaterally. CVS:  RRR. Heart sounds normal.  ABD/GI/GU: BS+. Abd soft.  Mild diffuse tenderness. MSK/EXT:  Moves extremities. No apparent deformity. No edema.  SKIN: no apparent skin lesion or wound NEURO: Awake but not alert.  Oriented to self and daughter.  Follows some commands.  Speech clear.  No facial droop.  PERRL.  Patellar reflex symmetric bilaterally.  Globally weak but no asymmetry. PSYCH: Calm. Normal affect.   Procedures:  None  Microbiology summarized: NLGXQ-11, influenza and RSV PCR nonreactive MRSA PCR screen nonreactive Blood cultures NGTD  Assessment and plan: Principal Problem:   Acute metabolic encephalopathy Active Problems:   Essential hypertension   Hypercalcemia   Dehydration   Acute prerenal azotemia   Transaminitis   Constipation   DM2 (diabetes mellitus, type 2) (HCC)   Allergic rhinitis   Chronic diastolic CHF (congestive heart failure) (HCC)  Acute metabolic encephalopathy: Likely due to dehydration and delirium.  Not on sedating medication.  Per family, no history of dementia or cognitive decline until her ED visit in December due to mechanical  fall.  Encephalopathy workup including ammonia, TSH, VBG, CT head, UDS and MRI unrevealing.  Low suspicion for infection.  She has no fever or leukocytosis.  She has some cough likely from silent aspiration in the setting of encephalopathy and emesis.  She has no focal neurodeficit to suggest CVA either.  She is currently awake but not alert.  She is oriented to self and her daughter.  Follows some commands. -Reorientation and delirium  precaution -Avoid sedating medications.  Discontinue Tessalon Perles.  Use plain Mucinex -Continue IV fluid hydration and hold diuretics -Discontinue antibiotics. -Fall and aspiration precaution. -PT/OT/SLP eval -Consult dietitian.  Dehydration: Patient looks dehydrated as evidenced by hemoconcentration, elevated creatinine, urinary concentration and mild hypercalcemia.  Family reports emesis.  CT abdomen and pelvis shows rectal impaction which could be contributing to emesis.  She is diffusely tender on exam.  -Continue IV fluid hydration -Continue holding diuretics  Vomiting: Likely due to fecal impaction.  She has mildly elevated LFT that seems to be improved.  CT abdomen and pelvis without acute finding other than fecal impaction. -Check RUQ U/S to exclude biliary stone/etiology -Antiemetics as needed -Continue IV fluid  Constipation/fecal impaction -Try Dulcolax suppository and digital disimpaction  Elevated LFT: Pattern consistent with EtOH and rhabdo but patient does not drink.  CK within normal.  LFT improved. -RUQ [place] as above  Chronic diastolic CHF: TTE on 2/2 with LVEF of 50 to 55% and G1 DD.  She was somewhat dehydrated on presentation.  She seems to be on Lasix.  No CHF on chest x-ray. -Continue holding Lasix -Strict intake and output, daily weights, renal functions and electrolytes  Chronic hypoxic respiratory failure: On 2 L at baseline.  Currently saturating in upper 90s to 100% on 2 L. -Wean oxygen.  Minimum oxygen to keep saturation above 90%. -Try incentive spirometry and pulmonary toilet  Controlled IDDM-2 with hyperglycemia: A1c 6.9%. -Continue current insulin regimen  Hypercalcemia: Likely secondary due to dehydration.  Resolved with hydration.  Generalized weakness/lethargy: Likely due to dehydration -PT/OT eval as above  HCAP/pneumonia ruled out: No fever, leukocytosis.  Procalcitonin within normal.  COVID-19, influenza and RSV PCR nonreactive.  MRSA PCR  negative.  Blood cultures NGTD.  Cough likely due to silent aspiration in the setting of encephalopathy and vomiting. -Monitor off antibiotics. -Discontinue Tessalon, and use Mucinex.  There is no height or weight on file to calculate BMI.          DVT prophylaxis:  enoxaparin (LOVENOX) injection 40 mg Start: 07/01/22 1230 SCDs Start: 06/30/22 2019  Code Status: Full code Family Communication: Updated patient's daughters at at bedside and over the phone Level of care: Med-Surg Status is: Inpatient Remains inpatient appropriate because: Acute metabolic encephalopathy, dehydration and lethargy.   Final disposition: Likely SNF. Consultants:  None  55 minutes with more than 50% spent in reviewing records, counseling patient/family and coordinating care.   Sch Meds:  Scheduled Meds:  bisacodyl  10 mg Rectal Daily   enoxaparin (LOVENOX) injection  40 mg Subcutaneous Q24H   fluticasone  2 spray Each Nare q morning   guaiFENesin  600 mg Oral BID   insulin aspart  0-6 Units Subcutaneous TID WC   magic mouthwash w/lidocaine  5 mL Oral QID   metoprolol succinate  12.5 mg Oral Daily   Continuous Infusions:  lactated ringers 75 mL/hr at 07/01/22 1133   PRN Meds:.acetaminophen **OR** acetaminophen, gadobutrol, ipratropium-albuterol, menthol-cetylpyridinium, milk and molasses, ondansetron (ZOFRAN) IV  Antimicrobials: Anti-infectives (From admission, onward)  Start     Dose/Rate Route Frequency Ordered Stop   07/01/22 2200  vancomycin (VANCOCIN) IVPB 1000 mg/200 mL premix  Status:  Discontinued        1,000 mg 200 mL/hr over 60 Minutes Intravenous Every 24 hours 06/30/22 2136 07/01/22 1047   07/01/22 0800  ceFEPIme (MAXIPIME) 2 g in sodium chloride 0.9 % 100 mL IVPB  Status:  Discontinued        2 g 200 mL/hr over 30 Minutes Intravenous Every 8 hours 07/01/22 0735 07/01/22 1047   06/30/22 2145  ceFEPIme (MAXIPIME) 2 g in sodium chloride 0.9 % 100 mL IVPB  Status:  Discontinued         2 g 200 mL/hr over 30 Minutes Intravenous Every 12 hours 06/30/22 2136 07/01/22 0735   06/30/22 2145  vancomycin (VANCOREADY) IVPB 2000 mg/400 mL        2,000 mg 200 mL/hr over 120 Minutes Intravenous  Once 06/30/22 2136 07/01/22 0204        I have personally reviewed the following labs and images: CBC: Recent Labs  Lab 06/30/22 1406 06/30/22 2000 07/01/22 0420 07/01/22 0830  WBC 7.2  --  9.3  --   NEUTROABS 4.7  --  6.1  --   HGB 15.2* 15.0 13.1 13.9  HCT 46.8* 44.0 40.5 41.0  MCV 87.5  --  87.1  --   PLT 192  --  223  --    BMP &GFR Recent Labs  Lab 06/30/22 1406 06/30/22 1938 06/30/22 2000 07/01/22 0420 07/01/22 0830  NA 140  --  140 138 144  K 4.5  --  4.3 4.0 3.7  CL 98  --   --  101  --   CO2 31  --   --  29  --   GLUCOSE 164*  --   --  136*  --   BUN 20  --   --  19  --   CREATININE 0.97  --   --  0.81  --   CALCIUM 10.3  --   --  9.6  --   MG  --  2.3  --  2.3  --   PHOS  --  3.1  --  2.7  --    Estimated Creatinine Clearance: 62.2 mL/min (by C-G formula based on SCr of 0.81 mg/dL). Liver & Pancreas: Recent Labs  Lab 06/30/22 1406 07/01/22 0420  AST 113* 72*  ALT 74* 45*  ALKPHOS 89 67  BILITOT 0.5 1.4*  PROT 8.3* 6.8  ALBUMIN 2.5* 2.0*   Recent Labs  Lab 06/30/22 1406  LIPASE 24   Recent Labs  Lab 06/30/22 1952  AMMONIA 15   Diabetic: No results for input(s): "HGBA1C" in the last 72 hours. Recent Labs  Lab 06/26/22 1214 06/26/22 1539 06/30/22 2311 07/01/22 0751 07/01/22 1132  GLUCAP 150* 118* 146* 133* 124*   Cardiac Enzymes: Recent Labs  Lab 06/30/22 2015  CKTOTAL 21*   No results for input(s): "PROBNP" in the last 8760 hours. Coagulation Profile: Recent Labs  Lab 07/01/22 0505  INR 1.0   Thyroid Function Tests: Recent Labs    06/30/22 1952  TSH 1.727   Lipid Profile: No results for input(s): "CHOL", "HDL", "LDLCALC", "TRIG", "CHOLHDL", "LDLDIRECT" in the last 72 hours. Anemia Panel: No results for  input(s): "VITAMINB12", "FOLATE", "FERRITIN", "TIBC", "IRON", "RETICCTPCT" in the last 72 hours. Urine analysis:    Component Value Date/Time   COLORURINE YELLOW 07/01/2022 0610   APPEARANCEUR  CLEAR 07/01/2022 0610   LABSPEC 1.038 (H) 07/01/2022 0610   PHURINE 5.0 07/01/2022 0610   GLUCOSEU NEGATIVE 07/01/2022 0610   HGBUR NEGATIVE 07/01/2022 0610   BILIRUBINUR NEGATIVE 07/01/2022 0610   KETONESUR 5 (A) 07/01/2022 0610   PROTEINUR >=300 (A) 07/01/2022 0610   UROBILINOGEN 0.2 09/15/2011 0530   NITRITE NEGATIVE 07/01/2022 0610   LEUKOCYTESUR NEGATIVE 07/01/2022 0610   Sepsis Labs: Invalid input(s): "PROCALCITONIN", "LACTICIDVEN"  Microbiology: Recent Results (from the past 240 hour(s))  Resp panel by RT-PCR (RSV, Flu A&B, Covid) Anterior Nasal Swab     Status: None   Collection Time: 06/30/22  1:31 PM   Specimen: Anterior Nasal Swab  Result Value Ref Range Status   SARS Coronavirus 2 by RT PCR NEGATIVE NEGATIVE Final   Influenza A by PCR NEGATIVE NEGATIVE Final   Influenza B by PCR NEGATIVE NEGATIVE Final    Comment: (NOTE) The Xpert Xpress SARS-CoV-2/FLU/RSV plus assay is intended as an aid in the diagnosis of influenza from Nasopharyngeal swab specimens and should not be used as a sole basis for treatment. Nasal washings and aspirates are unacceptable for Xpert Xpress SARS-CoV-2/FLU/RSV testing.  Fact Sheet for Patients: EntrepreneurPulse.com.au  Fact Sheet for Healthcare Providers: IncredibleEmployment.be  This test is not yet approved or cleared by the [place] FDA and has been authorized for detection and/or diagnosis of SARS-CoV-2 by FDA under an Emergency Use Authorization (EUA). This EUA will remain in effect (meaning this test can be used) for the duration of the COVID-19 declaration under Section 564(b)(1) of the Act, 21 U.S.C. section 360bbb-3(b)(1), unless the authorization is terminated or revoked.     Resp  Syncytial Virus by PCR NEGATIVE NEGATIVE Final    Comment: (NOTE) Fact Sheet for Patients: EntrepreneurPulse.com.au  Fact Sheet for Healthcare Providers: IncredibleEmployment.be  This test is not yet approved or cleared by the [place] FDA and has been authorized for detection and/or diagnosis of SARS-CoV-2 by FDA under an Emergency Use Authorization (EUA). This EUA will remain in effect (meaning this test can be used) for the duration of the COVID-19 declaration under Section 564(b)(1) of the Act, 21 U.S.C. section 360bbb-3(b)(1), unless the authorization is terminated or revoked.  Performed at [facility], [place] [address], [place], [place] [address]   Culture, blood (routine x 2)     Status: None (Preliminary result)   Collection Time: 06/30/22  7:52 PM   Specimen: BLOOD  Result Value Ref Range Status   Specimen Description BLOOD LEFT ANTECUBITAL  Final   Special Requests   Final    BOTTLES DRAWN AEROBIC AND ANAEROBIC Blood Culture adequate volume   Culture   Final    NO GROWTH < 12 HOURS Performed at [facility], [place] [address], [place], [place] [address]    Report Status PENDING  Incomplete  Culture, blood (routine x 2)     Status: None (Preliminary result)   Collection Time: 06/30/22  8:28 PM   Specimen: BLOOD LEFT HAND  Result Value Ref Range Status   Specimen Description BLOOD LEFT HAND  Final   Special Requests   Final    BOTTLES DRAWN AEROBIC AND ANAEROBIC Blood Culture adequate volume   Culture   Final    NO GROWTH < 12 HOURS Performed at [facility], [place] [address], [place], [place] [address]    Report Status PENDING  Incomplete  MRSA Next Gen by PCR, Nasal     Status: None   Collection Time:  06/30/22 11:21 PM  Result Value Ref Range Status   MRSA by PCR Next Gen NOT DETECTED NOT DETECTED Final    Comment: (NOTE) The GeneXpert MRSA Assay (FDA approved for NASAL specimens only), is one component of  a comprehensive MRSA colonization surveillance program. It is not intended to diagnose MRSA infection nor to guide or monitor treatment for MRSA infections. Test performance is not FDA approved in patients less than 78 years old. Performed at [facility], [place] [address] [place], [place] [address]     Radiology Studies: US Abdomen Limited RUQ (LIVER/GB)  Result Date: 07/01/2022 CLINICAL DATA:  Elevated liver function tests. EXAM: ULTRASOUND ABDOMEN LIMITED RIGHT UPPER QUADRANT COMPARISON:  None Available. FINDINGS: Gallbladder: No gallstones or wall thickening visualized. No sonographic Murphy sign noted by sonographer. Common bile duct: Diameter: Normal at 2 mm Liver: No focal lesion identified. Within normal limits in parenchymal echogenicity. Portal vein is patent on color Doppler imaging with normal direction of blood flow towards the liver. Other: None. IMPRESSION: Normal RIGHT upper quadrant ultrasound Electronically Signed   By: Suzy Bouchard M.D.   On: 07/01/2022 11:55   MR BRAIN WO CONTRAST  Result Date: 06/30/2022 CLINICAL DATA:  Stroke suspected EXAM: MRI HEAD WITHOUT CONTRAST TECHNIQUE: Multiplanar, multiecho pulse sequences of the brain and surrounding structures were obtained without intravenous contrast. COMPARISON:  No prior MRI available, correlation is made with CT head 06/30/2022 FINDINGS: Evaluation is limited by motion, which required less motion sensitive sequences. In addition, the patient could not remain still to complete the exam. Brain: No restricted diffusion to suggest acute or subacute infarct. No acute hemorrhage, mass, mass effect, or midline shift. No hydrocephalus or extra-axial collection. Vascular: Grossly normal arterial flow voids. Skull and upper cervical spine: Grossly normal marrow signal. Sinuses/Orbits: Unable to evaluate. Other: None. IMPRESSION: Evaluation is limited by motion, as well as the inability of the patient to complete the exam. Within  this limitation, no acute intracranial process. No evidence of acute or subacute infarct. Electronically Signed   By: Merilyn Baba M.D.   On: 06/30/2022 23:10   DG Chest Port 1 View  Result Date: 06/30/2022 CLINICAL DATA:  Cough and mental status change EXAM: PORTABLE CHEST 1 VIEW COMPARISON:  Chest x-ray 06/20/2022 FINDINGS: Heart is enlarged, unchanged. There is some minimal strandy and hazy opacities in both lung bases. There is no pneumothorax. There is a small left pleural effusion. No acute fractures are seen. IMPRESSION: 1. Minimal strandy and hazy opacities in both lung bases may represent atelectasis or infection. 2. Small left pleural effusion. 3. Stable cardiomegaly. Electronically Signed   By: Ronney Asters M.D.   On: 06/30/2022 20:30   CT Head Wo Contrast  Result Date: 06/30/2022 CLINICAL DATA:  Progressive lethargy EXAM: CT HEAD WITHOUT CONTRAST TECHNIQUE: Contiguous axial images were obtained from the base of the skull through the vertex without intravenous contrast. RADIATION DOSE REDUCTION: This exam was performed according to the departmental dose-optimization program which includes automated exposure control, adjustment of the mA and/or kV according to patient size and/or use of iterative reconstruction technique. COMPARISON:  CT brain 05/13/2022 FINDINGS: Brain: No acute territorial infarction, hemorrhage, or intracranial mass. Patchy white matter hypodensity consistent with chronic small vessel ischemic change. The ventricles are nonenlarged. Vascular: No hyperdense vessels.  No unexpected calcification. Skull: Normal. Negative for fracture or focal lesion. Sinuses/Orbits: No acute finding. Other: None IMPRESSION: 1. No CT evidence for acute intracranial abnormality. 2. Mild chronic small vessel ischemic  changes of the white matter. Electronically Signed   By: Donavan Foil M.D.   On: 06/30/2022 18:32   CT ABDOMEN PELVIS W CONTRAST  Result Date: 06/30/2022 CLINICAL DATA:  Left lower  quadrant abdominal pain. EXAM: CT ABDOMEN AND PELVIS WITH CONTRAST TECHNIQUE: Multidetector CT imaging of the abdomen and pelvis was performed using the standard protocol following bolus administration of intravenous contrast. RADIATION DOSE REDUCTION: This exam was performed according to the departmental dose-optimization program which includes automated exposure control, adjustment of the mA and/or kV according to patient size and/or use of iterative reconstruction technique. CONTRAST:  50mL OMNIPAQUE IOHEXOL 350 MG/ML SOLN COMPARISON:  None Available. FINDINGS: Lower chest: Small right pleural effusion is noted with adjacent subsegmental atelectasis. Hepatobiliary: No focal liver abnormality is seen. No gallstones, gallbladder wall thickening, or biliary dilatation. Pancreas: Unremarkable. No pancreatic ductal dilatation or surrounding inflammatory changes. Spleen: Normal in size without focal abnormality. Adrenals/Urinary Tract: Adrenal glands are unremarkable. Kidneys are normal, without renal calculi, focal lesion, or hydronephrosis. Bladder is unremarkable. Stomach/Bowel: The stomach and appendix are unremarkable. There is no evidence of bowel obstruction or inflammation. A large amount of stool seen in the rectum concerning for impaction. Vascular/Lymphatic: Aortic atherosclerosis. No enlarged abdominal or pelvic lymph nodes. Reproductive: Status post hysterectomy. No adnexal masses. Other: No abdominal wall hernia or abnormality. No abdominopelvic ascites. Musculoskeletal: No acute or significant osseous findings. IMPRESSION: Small right pleural effusion is noted with adjacent subsegmental atelectasis. Large amount of stool seen in the rectum concerning for impaction. No other significant abnormality seen in the abdomen or pelvis. Aortic Atherosclerosis (ICD10-I70.0). Electronically Signed   By: Marijo Conception M.D.   On: 06/30/2022 18:31      Justa Hatchell T. [place]  If 7PM-7AM, please  contact night-coverage [email or web address] 07/01/2022, 12:25 PM

## 2022-07-01 NOTE — Progress Notes (Signed)
PT Cancellation Note  Patient Details Name: Barbara Nelson MRN: [id number] DOB: 07/05/1940   Cancelled Treatment:    Reason Eval/Treat Not Completed: Fatigue/lethargy limiting ability to participate.  Too lethargic for therapy, and per nsg and family would be best to see in AM for most alert state of pt.   Ramond Dial 07/01/2022, 3:44 PM  Mee Hives, PT PhD Acute Rehab Dept. Number: [place] and [place]

## 2022-07-01 NOTE — Evaluation (Signed)
Clinical/Bedside Swallow Evaluation Patient Details  Name: Barbara Nelson MRN: [id number] Date of Birth: [birth year]  Today's Date: 07/01/2022 Time: SLP Start Time (ACUTE ONLY): 33 SLP Stop Time (ACUTE ONLY): 81 SLP Time Calculation (min) (ACUTE ONLY): 15 min  Past Medical History:  Past Medical History:  Diagnosis Date   Benign essential HTN 10/31/2015   Cardiac tamponade due to viral pericarditis    LBBB (left bundle branch block)    Lung mass    fungal infection s/p resection at [facility]   Postoperative atrial fibrillation [facility])    Pulmonary nodule    Past Surgical History:  Past Surgical History:  Procedure Laterality Date   ABDOMINAL HYSTERECTOMY  1988   chest tube placement  09/03/11   LUNG REMOVAL, PARTIAL     right knee arthoscopy with meniscal repair     subxiphoid pericardial window and drainage anterior partial pericardiectomy  09/03/11   HPI:  Pt is a 82 y.o. female who presented with AMS and was admitted with acute metabolic encephalopathy in the setting of HCAP pneumonia. CXR 2/13: Minimal strandy and hazy opacities in both lung bases may  represent atelectasis or infection. PMH: type 2 diabetes mellitus, essential hypertension, chronic diastolic heart failure, chronic hypoxic respiratory failure. BSE 06/20/22 regular texture diet with thin liquids recommended.    Assessment / Plan / Recommendation  Clinical Impression  Pt was seen for bedside swallow evaluation with her daughter present who advised that the pt typically consumes a regular texture diet, but has been having some coughing with p.o. intake this month. She further stated that the pt has been having difficulty sucking from a straw and has required prompts. A complete oral mechanism exam could not be conducted due to pt's difficulty following commands. [name] RN reported that the pt roused and was able to take pills and drink thin liquids without overt s/s of aspiration this morning, but that she quickly falls  asleep thereafter. Pt was lethargic throughout the evaluation and this likely impacted her performance. She demonstrated symptoms of oropharyngeal dysphagia characterized by oral holding, reduced bolus awareness, and intermittent throat clearing with thin liquids. Considering RN's report of tolerance of thin liquids this morning when alertness was more optimal, pt's diet will be modified to dysphagia 1 with thin liquids. However, it is strongly recommended that p.o. intake be deferred if she is lethargic and exhibiting difficulty maintaining alertness. SLP will follow closely to assess tolerance and for advancement or instrumental assessment as clinically indicated. SLP Visit Diagnosis: Dysphagia, unspecified (R13.10)    Aspiration Risk  Moderate aspiration risk    Diet Recommendation Dysphagia 1 (Puree);Thin liquid   Liquid Administration via: Cup;Straw Medication Administration: Crushed with puree Supervision: Full supervision/cueing for compensatory strategies Compensations: Slow rate;Small sips/bites Postural Changes: Seated upright at 90 degrees;Remain upright for at least 30 minutes after po intake    Other  Recommendations Oral Care Recommendations: Oral care BID;Staff/trained caregiver to provide oral care    Recommendations for follow up therapy are one component of a multi-disciplinary discharge planning process, led by the attending physician.  Recommendations may be updated based on patient status, additional functional criteria and insurance authorization.  Follow up Recommendations  (TBD)      Assistance Recommended at Discharge Frequent or constant Supervision/Assistance  Functional Status Assessment Patient has had a recent decline in their functional status and demonstrates the ability to make significant improvements in function in a reasonable and predictable amount of time.  Frequency and Duration min 2x/week  2  weeks       Prognosis Prognosis for improved  oropharyngeal function: Good Barriers to Reach Goals: Severity of deficits;Cognitive deficits      Swallow Study   General Date of Onset: 07/01/22 HPI: Pt is a 82 y.o. female who presented with AMS and was admitted with acute metabolic encephalopathy in the setting of HCAP pneumonia. CXR 2/13: Minimal strandy and hazy opacities in both lung bases may  represent atelectasis or infection. PMH: type 2 diabetes mellitus, essential hypertension, chronic diastolic heart failure, chronic hypoxic respiratory failure. BSE 06/20/22 regular texture diet with thin liquids recommended. Type of Study: Bedside Swallow Evaluation Previous Swallow Assessment: See HPI Diet Prior to this Study: Regular;Thin liquids (Level 0) Temperature Spikes Noted: No Respiratory Status: Nasal cannula History of Recent Intubation: No Behavior/Cognition: Alert;Cooperative Oral Cavity Assessment: Within Functional Limits Oral Care Completed by SLP: No Oral Cavity - Dentition: Adequate natural dentition Vision: Functional for self-feeding Self-Feeding Abilities: Total assist Patient Positioning: Upright in bed;Postural control adequate for testing    Oral/Motor/Sensory Function     Ice Chips Ice chips: Impaired Presentation: Spoon Oral Phase Impairments: Poor awareness of bolus   Thin Liquid Thin Liquid: Impaired Presentation: Cup;Spoon Oral Phase Impairments: Poor awareness of bolus Oral Phase Functional Implications: Oral holding Pharyngeal  Phase Impairments: Throat Clearing - Immediate    Nectar Thick Nectar Thick Liquid: Not tested   Honey Thick Honey Thick Liquid: Not tested   Puree Puree: Impaired Presentation: Spoon Oral Phase Impairments: Poor awareness of bolus Oral Phase Functional Implications: Oral holding   Solid     Solid: Not tested     Barbara Nelson I. Barbara Nelson, [place], [place] Office number [phone]  Barbara Nelson 07/01/2022,2:01 PM

## 2022-07-01 NOTE — Progress Notes (Addendum)
Pharmacy Antibiotic Note  Barbara Nelson is a 82 y.o. female admitted on 06/30/2022 with pneumonia.  Pharmacy has been consulted for vancomycin and cefepime dosing. Scr down to 0.81 (near baseline).  Plan: Vancomycin 1g IV q24h (eAUC 458, Scr 0.81, Vd 0.5) - consider d/c with mrsa pcr negative? Increase cefepime to 2g IV q8h F/u C&S, renal function, s/sx improvement, vancomycin levels at steady state as indicated      Temp (24hrs), Avg:97.9 F (36.6 C), Min:96.8 F (36 C), Max:98.5 F (36.9 C)  Recent Labs  Lab 06/30/22 1406 06/30/22 1937 07/01/22 0420  WBC 7.2  --  9.3  CREATININE 0.97  --  0.81  LATICACIDVEN  --  2.1*  --      Estimated Creatinine Clearance: 62.2 mL/min (by C-G formula based on SCr of 0.81 mg/dL).    Allergies  Allergen Reactions   Latex Rash   Penicillins Rash and Itching   Tape Hives and Rash   Z-Pak [Azithromycin] Rash and Other (See Comments)    Mouth sores   Eggs Or Egg-Derived Products Nausea And Vomiting   Amoxil [Amoxicillin] Rash   Sulfa Antibiotics Rash    Arturo Morton, PharmD, BCPS Please check AMION for all [place] contact numbers Clinical Pharmacist 07/01/2022 7:36 AM

## 2022-07-01 NOTE — ED Notes (Signed)
ED TO INPATIENT HANDOFF REPORT  ED Nurse Name and Phone #: Anette Guarneri [phone]  S Name/Age/Gender Barbara Nelson 82 y.o. female Room/Bed: 007C/007C  Code Status   Code Status: Full Code  Home/SNF/Other Rehab Patient oriented to: self Is this baseline? No   Triage Complete: Triage complete  Chief Complaint Acute metabolic encephalopathy [S85.46]  Triage Note Pt BIB EMS due to progressive lethargy. Pt was having abd pain and vomiting last night. Pt is conversational at baseline? VSS. Pt on 2L of O2 at baseline    Allergies Allergies  Allergen Reactions   Latex Rash   Penicillins Rash and Itching   Tape Hives and Rash   Z-Pak [Azithromycin] Rash and Other (See Comments)    Mouth sores   Eggs Or Egg-Derived Products Nausea And Vomiting   Amoxil [Amoxicillin] Rash   Sulfa Antibiotics Rash    Level of Care/Admitting Diagnosis ED Disposition     ED Disposition  Admit   Condition  --   Comment  Hospital Area: [place] [[id number]]  Level of Care: Med-Surg [16]  May admit patient to [facility] or [facility] if equivalent level of care is available:: No  Covid Evaluation: Confirmed COVID Negative  Diagnosis: Acute metabolic encephalopathy [2703500]  Admitting Physician: Mercy Riding [[id number]]  Attending Physician: Mercy Riding [[id number]]  Certification:: I certify this patient will need inpatient services for at least 2 midnights          B Medical/Surgery History Past Medical History:  Diagnosis Date   Benign essential HTN 10/31/2015   Cardiac tamponade due to viral pericarditis    LBBB (left bundle branch block)    Lung mass    fungal infection s/p resection at [facility]   Postoperative atrial fibrillation [facility])    Pulmonary nodule    Past Surgical History:  Procedure Laterality Date   ABDOMINAL HYSTERECTOMY  1988   chest tube placement  09/03/11   LUNG REMOVAL, PARTIAL     right knee arthoscopy with meniscal repair     subxiphoid  pericardial window and drainage anterior partial pericardiectomy  09/03/11     A IV Location/Drains/Wounds Patient Lines/Drains/Airways Status     Active Line/Drains/Airways     Name Placement date Placement time Site Days   Peripheral IV 06/30/22 20 G Right Antecubital 06/30/22  1411  Antecubital  1   External Urinary Catheter 06/18/22  1228  --  13            Intake/Output Last 24 hours  Intake/Output Summary (Last 24 hours) at 07/01/2022 1101 Last data filed at 07/01/2022 7893 Gross per 24 hour  Intake 1100 ml  Output --  Net 1100 ml    Labs/Imaging Results for orders placed or performed during the hospital encounter of 06/30/22 (from the past 48 hour(s))  Resp panel by RT-PCR (RSV, Flu A&B, Covid) Anterior Nasal Swab     Status: None   Collection Time: 06/30/22  1:31 PM   Specimen: Anterior Nasal Swab  Result Value Ref Range   SARS Coronavirus 2 by RT PCR NEGATIVE NEGATIVE   Influenza A by PCR NEGATIVE NEGATIVE   Influenza B by PCR NEGATIVE NEGATIVE    Comment: (NOTE) The Xpert Xpress SARS-CoV-2/FLU/RSV plus assay is intended as an aid in the diagnosis of influenza from Nasopharyngeal swab specimens and should not be used as a sole basis for treatment. Nasal washings and aspirates are unacceptable for Xpert Xpress SARS-CoV-2/FLU/RSV testing.  Fact Sheet for Patients: EntrepreneurPulse.com.au  Fact Sheet for Healthcare Providers: IncredibleEmployment.be  This test is not yet approved or cleared by the [place] FDA and has been authorized for detection and/or diagnosis of SARS-CoV-2 by FDA under an Emergency Use Authorization (EUA). This EUA will remain in effect (meaning this test can be used) for the duration of the COVID-19 declaration under Section 564(b)(1) of the Act, 21 U.S.C. section 360bbb-3(b)(1), unless the authorization is terminated or revoked.     Resp Syncytial Virus by PCR NEGATIVE NEGATIVE    Comment:  (NOTE) Fact Sheet for Patients: EntrepreneurPulse.com.au  Fact Sheet for Healthcare Providers: IncredibleEmployment.be  This test is not yet approved or cleared by the [place] FDA and has been authorized for detection and/or diagnosis of SARS-CoV-2 by FDA under an Emergency Use Authorization (EUA). This EUA will remain in effect (meaning this test can be used) for the duration of the COVID-19 declaration under Section 564(b)(1) of the Act, 21 U.S.C. section 360bbb-3(b)(1), unless the authorization is terminated or revoked.  Performed at [facility], [place] [address] [place] [address], [place], [place] [address]   CBC with Differential     Status: Abnormal   Collection Time: 06/30/22  2:06 PM  Result Value Ref Range   WBC 7.2 4.0 - 10.5 K/uL   RBC 5.35 (H) 3.87 - 5.11 MIL/uL   Hemoglobin 15.2 (H) 12.0 - 15.0 g/dL   HCT 46.8 (H) 36.0 - 46.0 %   MCV 87.5 80.0 - 100.0 fL   MCH 28.4 26.0 - 34.0 pg   MCHC 32.5 30.0 - 36.0 g/dL   RDW 14.9 11.5 - 15.5 %   Platelets 192 150 - 400 K/uL    Comment: REPEATED TO VERIFY   nRBC 0.0 0.0 - 0.2 %   Neutrophils Relative % 65 %   Neutro Abs 4.7 1.7 - 7.7 K/uL   Lymphocytes Relative 12 %   Lymphs Abs 0.8 0.7 - 4.0 K/uL   Monocytes Relative 19 %   Monocytes Absolute 1.3 (H) 0.1 - 1.0 K/uL   Eosinophils Relative 0 %   Eosinophils Absolute 0.0 0.0 - 0.5 K/uL   Basophils Relative 0 %   Basophils Absolute 0.0 0.0 - 0.1 K/uL   Immature Granulocytes 4 %   Abs Immature Granulocytes 0.26 (H) 0.00 - 0.07 K/uL    Comment: Performed at [facility] [address] [place], [place] [address]  Comprehensive metabolic panel     Status: Abnormal   Collection Time: 06/30/22  2:06 PM  Result Value Ref Range   Sodium 140 135 - 145 mmol/L   Potassium 4.5 3.5 - 5.1 mmol/L   Chloride 98 98 - 111 mmol/L   CO2 31 22 - 32 mmol/L   Glucose, Bld 164 (H) 70 - 99 mg/dL    Comment: Glucose reference range applies only to samples  taken after fasting for at least 8 hours.   BUN 20 8 - 23 mg/dL   Creatinine, Ser 0.97 0.44 - 1.00 mg/dL   Calcium 10.3 8.9 - 10.3 mg/dL   Total Protein 8.3 (H) 6.5 - 8.1 g/dL   Albumin 2.5 (L) 3.5 - 5.0 g/dL   AST 113 (H) 15 - 41 U/L   ALT 74 (H) 0 - 44 U/L   Alkaline Phosphatase 89 38 - 126 U/L   Total Bilirubin 0.5 0.3 - 1.2 mg/dL   GFR, Estimated 58 (L) >60 mL/min    Comment: (NOTE) Calculated using the CKD-EPI Creatinine Equation (2021)    Anion gap  11 5 - 15    Comment: Performed at [facility], [place] [address] [place], [place] [address]  Lipase, blood     Status: None   Collection Time: 06/30/22  2:06 PM  Result Value Ref Range   Lipase 24 11 - 51 U/L    Comment: Performed at [facility], [place] [address] [place], [place] [address]  Brain natriuretic peptide     Status: None   Collection Time: 06/30/22  2:06 PM  Result Value Ref Range   B Natriuretic Peptide 28.2 0.0 - 100.0 pg/mL    Comment: Performed at [facility] [address], [place], [place] [address]  Lactic acid, plasma     Status: Abnormal   Collection Time: 06/30/22  7:37 PM  Result Value Ref Range   Lactic Acid, Venous 2.1 (HH) 0.5 - 1.9 mmol/L    Comment: CRITICAL RESULT CALLED TO, READ BACK BY AND VERIFIED WITH A.JAMES,RN. 2107 06/30/22. LPAIT Performed at [facility], [place] [address], [place], [place] [address] CORRECTED ON 02/14 AT 0246: PREVIOUSLY REPORTED AS 2.1 CRITICAL RESULT CALLED TO, READ BACK BY AND VERIFIED WITH A.JAMES,RN. 06/30/22. LPAIT   Magnesium     Status: None   Collection Time: 06/30/22  7:38 PM  Result Value Ref Range   Magnesium 2.3 1.7 - 2.4 mg/dL    Comment: Performed at [facility], [place] [address], [place], [place] [address]  Phosphorus     Status: None   Collection Time: 06/30/22  7:38 PM  Result Value Ref Range   Phosphorus 3.1 2.5 - 4.6 mg/dL    Comment: Performed at [facility], [place] [address], [place], [place] [address]  Culture,  blood (routine x 2)     Status: None (Preliminary result)   Collection Time: 06/30/22  7:52 PM   Specimen: BLOOD  Result Value Ref Range   Specimen Description BLOOD LEFT ANTECUBITAL    Special Requests      BOTTLES DRAWN AEROBIC AND ANAEROBIC Blood Culture adequate volume   Culture      NO GROWTH < 12 HOURS Performed at [facility], [place] [address], [place], [place] [address]    Report Status PENDING   Ammonia     Status: None   Collection Time: 06/30/22  7:52 PM  Result Value Ref Range   Ammonia 15 9 - 35 umol/L    Comment: Performed at [facility], [place] [address], [place], [place] [address]  TSH     Status: None   Collection Time: 06/30/22  7:52 PM  Result Value Ref Range   TSH 1.727 0.350 - 4.500 uIU/mL    Comment: Performed by a 3rd Generation assay with a functional sensitivity of <=0.01 uIU/mL. Performed at [facility], [place] [address], [place], [place] [address]   I-Stat venous blood gas, ED     Status: Abnormal   Collection Time: 06/30/22  8:00 PM  Result Value Ref Range   pH, Ven 7.469 (H) 7.25 - 7.43   pCO2, Ven 48.0 44 - 60 mmHg   pO2, Ven 23 (LL) 32 - 45 mmHg   Bicarbonate 34.8 (H) 20.0 - 28.0 mmol/L   TCO2 36 (H) 22 - 32 mmol/L   O2 Saturation 42 %   Acid-Base Excess 10.0 (H) 0.0 - 2.0 mmol/L   Sodium 140 135 - 145 mmol/L   Potassium 4.3 3.5 - 5.1 mmol/L  Calcium, Ion 1.27 1.15 - 1.40 mmol/L   HCT 44.0 36.0 - 46.0 %   Hemoglobin 15.0 12.0 - 15.0 g/dL   Sample type VENOUS    Comment NOTIFIED PHYSICIAN   Procalcitonin - Baseline     Status: None   Collection Time: 06/30/22  8:15 PM  Result Value Ref Range   Procalcitonin <0.10 ng/mL    Comment:        Interpretation: PCT (Procalcitonin) <= 0.5 ng/mL: Systemic infection (sepsis) is not likely. Local bacterial infection is possible. (NOTE)       Sepsis PCT Algorithm           Lower Respiratory Tract                                      Infection PCT Algorithm     ----------------------------     ----------------------------         PCT < 0.25 ng/mL                PCT < 0.10 ng/mL          Strongly encourage             Strongly discourage   discontinuation of antibiotics    initiation of antibiotics    ----------------------------     -----------------------------       PCT 0.25 - 0.50 ng/mL            PCT 0.10 - 0.25 ng/mL               OR       >80% decrease in PCT            Discourage initiation of                                            antibiotics      Encourage discontinuation           of antibiotics    ----------------------------     -----------------------------         PCT >= 0.50 ng/mL              PCT 0.26 - 0.50 ng/mL               AND        <80% decrease in PCT             Encourage initiation of                                             antibiotics       Encourage continuation           of antibiotics    ----------------------------     -----------------------------        PCT >= 0.50 ng/mL                  PCT > 0.50 ng/mL               AND         increase in PCT                  Strongly encourage  initiation of antibiotics    Strongly encourage escalation           of antibiotics                                     -----------------------------                                           PCT <= 0.25 ng/mL                                                 OR                                        > 80% decrease in PCT                                      Discontinue / Do not initiate                                             antibiotics  Performed at [facility], [address], [place], [place] [address]   CK     Status: Abnormal   Collection Time: 06/30/22  8:15 PM  Result Value Ref Range   Total CK 21 (L) 38 - 234 U/L    Comment: Performed at [facility], [place] [address], [place], [place] [address]  Culture, blood (routine x 2)     Status: None (Preliminary  result)   Collection Time: 06/30/22  8:28 PM   Specimen: BLOOD LEFT HAND  Result Value Ref Range   Specimen Description BLOOD LEFT HAND    Special Requests      BOTTLES DRAWN AEROBIC AND ANAEROBIC Blood Culture adequate volume   Culture      NO GROWTH < 12 HOURS Performed at [facility], [place] [address] [place], [place] [address]    Report Status PENDING   Acetaminophen level     Status: Abnormal   Collection Time: 06/30/22  9:05 PM  Result Value Ref Range   Acetaminophen (Tylenol), Serum <10 (L) 10 - 30 ug/mL    Comment: (NOTE) Therapeutic concentrations vary significantly. A range of 10-30 ug/mL  may be an effective concentration for many patients. However, some  are best treated at concentrations outside of this range. Acetaminophen concentrations >150 ug/mL at 4 hours after ingestion  and >50 ug/mL at 12 hours after ingestion are often associated with  toxic reactions.  Performed at [facility], [place] [address], [place], [place] [address]   CBG monitoring, ED     Status: Abnormal   Collection Time: 06/30/22 11:11 PM  Result Value Ref Range   Glucose-Capillary 146 (H) 70 - 99 mg/dL    Comment: Glucose reference range applies only to samples taken after fasting for at least 8 hours.  MRSA  Next Gen by PCR, Nasal     Status: None   Collection Time: 06/30/22 11:21 PM  Result Value Ref Range   MRSA by PCR Next Gen NOT DETECTED NOT DETECTED    Comment: (NOTE) The GeneXpert MRSA Assay (FDA approved for NASAL specimens only), is one component of a comprehensive MRSA colonization surveillance program. It is not intended to diagnose MRSA infection nor to guide or monitor treatment for MRSA infections. Test performance is not FDA approved in patients less than 56 years old. Performed at [facility], [place] [address], [place], [place] [address]   CBC with Differential/Platelet     Status: Abnormal   Collection Time: 07/01/22  4:20 AM  Result Value Ref Range    WBC 9.3 4.0 - 10.5 K/uL   RBC 4.65 3.87 - 5.11 MIL/uL   Hemoglobin 13.1 12.0 - 15.0 g/dL   HCT 40.5 36.0 - 46.0 %   MCV 87.1 80.0 - 100.0 fL   MCH 28.2 26.0 - 34.0 pg   MCHC 32.3 30.0 - 36.0 g/dL   RDW 15.0 11.5 - 15.5 %   Platelets 223 150 - 400 K/uL   nRBC 0.0 0.0 - 0.2 %   Neutrophils Relative % 66 %   Neutro Abs 6.1 1.7 - 7.7 K/uL   Lymphocytes Relative 11 %   Lymphs Abs 1.0 0.7 - 4.0 K/uL   Monocytes Relative 20 %   Monocytes Absolute 1.9 (H) 0.1 - 1.0 K/uL   Eosinophils Relative 0 %   Eosinophils Absolute 0.0 0.0 - 0.5 K/uL   Basophils Relative 0 %   Basophils Absolute 0.0 0.0 - 0.1 K/uL   Immature Granulocytes 3 %   Abs Immature Granulocytes 0.27 (H) 0.00 - 0.07 K/uL    Comment: Performed at [facility] [address], [place], [place] [address]  Comprehensive metabolic panel     Status: Abnormal   Collection Time: 07/01/22  4:20 AM  Result Value Ref Range   Sodium 138 135 - 145 mmol/L   Potassium 4.0 3.5 - 5.1 mmol/L    Comment: HEMOLYSIS AT THIS LEVEL MAY AFFECT RESULT   Chloride 101 98 - 111 mmol/L   CO2 29 22 - 32 mmol/L   Glucose, Bld 136 (H) 70 - 99 mg/dL    Comment: Glucose reference range applies only to samples taken after fasting for at least 8 hours.   BUN 19 8 - 23 mg/dL   Creatinine, Ser 0.81 0.44 - 1.00 mg/dL   Calcium 9.6 8.9 - 10.3 mg/dL   Total Protein 6.8 6.5 - 8.1 g/dL   Albumin 2.0 (L) 3.5 - 5.0 g/dL   AST 72 (H) 15 - 41 U/L    Comment: HEMOLYSIS AT THIS LEVEL MAY AFFECT RESULT   ALT 45 (H) 0 - 44 U/L    Comment: HEMOLYSIS AT THIS LEVEL MAY AFFECT RESULT   Alkaline Phosphatase 67 38 - 126 U/L   Total Bilirubin 1.4 (H) 0.3 - 1.2 mg/dL    Comment: HEMOLYSIS AT THIS LEVEL MAY AFFECT RESULT   GFR, Estimated >60 >60 mL/min    Comment: (NOTE) Calculated using the CKD-EPI Creatinine Equation (2021)    Anion gap 8 5 - 15    Comment: Performed at [facility], [place] [address] [place], [place] [address]  Magnesium     Status: None    Collection Time: 07/01/22  4:20 AM  Result Value Ref Range   Magnesium 2.3 1.7 - 2.4  mg/dL    Comment: Performed at [facility], [place] [address] [place], [place] [address]  Phosphorus     Status: None   Collection Time: 07/01/22  4:20 AM  Result Value Ref Range   Phosphorus 2.7 2.5 - 4.6 mg/dL    Comment: Performed at [facility] [address], [place], [place] [address]  Protime-INR     Status: None   Collection Time: 07/01/22  5:05 AM  Result Value Ref Range   Prothrombin Time 13.2 11.4 - 15.2 seconds   INR 1.0 0.8 - 1.2    Comment: (NOTE) INR goal varies based on device and disease states. Performed at [facility], [place] [address] [place], [place] [address]   Urinalysis, Routine w reflex microscopic -Urine, Clean Catch     Status: Abnormal   Collection Time: 07/01/22  6:10 AM  Result Value Ref Range   Color, Urine YELLOW YELLOW   APPearance CLEAR CLEAR   Specific Gravity, Urine 1.038 (H) 1.005 - 1.030   pH 5.0 5.0 - 8.0   Glucose, UA NEGATIVE NEGATIVE mg/dL   Hgb urine dipstick NEGATIVE NEGATIVE   Bilirubin Urine NEGATIVE NEGATIVE   Ketones, ur 5 (A) NEGATIVE mg/dL   Protein, ur >=300 (A) NEGATIVE mg/dL   Nitrite NEGATIVE NEGATIVE   Leukocytes,Ua NEGATIVE NEGATIVE   RBC / HPF 0-5 0 - 5 RBC/hpf   WBC, UA 0-5 0 - 5 WBC/hpf   Bacteria, UA NONE SEEN NONE SEEN   Squamous Epithelial / HPF 0-5 0 - 5 /HPF   Mucus PRESENT     Comment: Performed at [facility], [place] [address], [place], [place] [address]  Rapid urine drug screen (hospital performed)     Status: None   Collection Time: 07/01/22  6:10 AM  Result Value Ref Range   Opiates NONE DETECTED NONE DETECTED   Cocaine NONE DETECTED NONE DETECTED   Benzodiazepines NONE DETECTED NONE DETECTED   Amphetamines NONE DETECTED NONE DETECTED   Tetrahydrocannabinol NONE DETECTED NONE DETECTED   Barbiturates NONE DETECTED NONE DETECTED    Comment: (NOTE) DRUG SCREEN FOR MEDICAL PURPOSES ONLY.  IF  CONFIRMATION IS NEEDED FOR ANY PURPOSE, NOTIFY LAB WITHIN 5 DAYS.  LOWEST DETECTABLE LIMITS FOR URINE DRUG SCREEN Drug Class                     Cutoff (ng/mL) Amphetamine and metabolites    1000 Barbiturate and metabolites    200 Benzodiazepine                 200 Opiates and metabolites        300 Cocaine and metabolites        300 THC                            50 Performed at [facility], [place] [address] [place], [place] [address]   CBG monitoring, ED     Status: Abnormal   Collection Time: 07/01/22  7:51 AM  Result Value Ref Range   Glucose-Capillary 133 (H) 70 - 99 mg/dL    Comment: Glucose reference range applies only to samples taken after fasting for at least 8 hours.   Comment 1 Notify RN    Comment 2 Document in Chart   I-Stat venous blood gas, ED     Status: Abnormal   Collection Time: 07/01/22  8:30 AM  Result Value Ref Range   pH, Ven 7.440 (H) 7.25 - 7.43   pCO2, Ven 48.1 44 - 60 mmHg   pO2, Ven 42 32 - 45 mmHg   Bicarbonate 32.7 (H) 20.0 - 28.0 mmol/L   TCO2 34 (H) 22 - 32 mmol/L   O2 Saturation 79 %   Acid-Base Excess 7.0 (H) 0.0 - 2.0 mmol/L   Sodium 144 135 - 145 mmol/L   Potassium 3.7 3.5 - 5.1 mmol/L   Calcium, Ion 1.29 1.15 - 1.40 mmol/L   HCT 41.0 36.0 - 46.0 %   Hemoglobin 13.9 12.0 - 15.0 g/dL   Sample type VENOUS    MR BRAIN WO CONTRAST  Result Date: 06/30/2022 CLINICAL DATA:  Stroke suspected EXAM: MRI HEAD WITHOUT CONTRAST TECHNIQUE: Multiplanar, multiecho pulse sequences of the brain and surrounding structures were obtained without intravenous contrast. COMPARISON:  No prior MRI available, correlation is made with CT head 06/30/2022 FINDINGS: Evaluation is limited by motion, which required less motion sensitive sequences. In addition, the patient could not remain still to complete the exam. Brain: No restricted diffusion to suggest acute or subacute infarct. No acute hemorrhage, mass, mass effect, or midline shift. No hydrocephalus or  extra-axial collection. Vascular: Grossly normal arterial flow voids. Skull and upper cervical spine: Grossly normal marrow signal. Sinuses/Orbits: Unable to evaluate. Other: None. IMPRESSION: Evaluation is limited by motion, as well as the inability of the patient to complete the exam. Within this limitation, no acute intracranial process. No evidence of acute or subacute infarct. Electronically Signed   By: Merilyn Baba M.D.   On: 06/30/2022 23:10   DG Chest Port 1 View  Result Date: 06/30/2022 CLINICAL DATA:  Cough and mental status change EXAM: PORTABLE CHEST 1 VIEW COMPARISON:  Chest x-ray 06/20/2022 FINDINGS: Heart is enlarged, unchanged. There is some minimal strandy and hazy opacities in both lung bases. There is no pneumothorax. There is a small left pleural effusion. No acute fractures are seen. IMPRESSION: 1. Minimal strandy and hazy opacities in both lung bases may represent atelectasis or infection. 2. Small left pleural effusion. 3. Stable cardiomegaly. Electronically Signed   By: Ronney Asters M.D.   On: 06/30/2022 20:30   CT Head Wo Contrast  Result Date: 06/30/2022 CLINICAL DATA:  Progressive lethargy EXAM: CT HEAD WITHOUT CONTRAST TECHNIQUE: Contiguous axial images were obtained from the base of the skull through the vertex without intravenous contrast. RADIATION DOSE REDUCTION: This exam was performed according to the departmental dose-optimization program which includes automated exposure control, adjustment of the Jhonny Calixto and/or kV according to patient size and/or use of iterative reconstruction technique. COMPARISON:  CT brain 05/13/2022 FINDINGS: Brain: No acute territorial infarction, hemorrhage, or intracranial mass. Patchy white matter hypodensity consistent with chronic small vessel ischemic change. The ventricles are nonenlarged. Vascular: No hyperdense vessels.  No unexpected calcification. Skull: Normal. Negative for fracture or focal lesion. Sinuses/Orbits: No acute finding. Other:  None IMPRESSION: 1. No CT evidence for acute intracranial abnormality. 2. Mild chronic small vessel ischemic changes of the white matter. Electronically Signed   By: Donavan Foil M.D.   On: 06/30/2022 18:32   CT ABDOMEN PELVIS W CONTRAST  Result Date: 06/30/2022 CLINICAL DATA:  Left lower quadrant abdominal pain. EXAM: CT ABDOMEN AND PELVIS WITH CONTRAST TECHNIQUE: Multidetector CT imaging of the abdomen and pelvis was performed using the standard protocol following bolus administration of intravenous contrast. RADIATION DOSE REDUCTION: This exam was performed according to the departmental dose-optimization program which includes automated exposure  control, adjustment of the Sonni Barse and/or kV according to patient size and/or use of iterative reconstruction technique. CONTRAST:  56mL OMNIPAQUE IOHEXOL 350 MG/ML SOLN COMPARISON:  None Available. FINDINGS: Lower chest: Small right pleural effusion is noted with adjacent subsegmental atelectasis. Hepatobiliary: No focal liver abnormality is seen. No gallstones, gallbladder wall thickening, or biliary dilatation. Pancreas: Unremarkable. No pancreatic ductal dilatation or surrounding inflammatory changes. Spleen: Normal in size without focal abnormality. Adrenals/Urinary Tract: Adrenal glands are unremarkable. Kidneys are normal, without renal calculi, focal lesion, or hydronephrosis. Bladder is unremarkable. Stomach/Bowel: The stomach and appendix are unremarkable. There is no evidence of bowel obstruction or inflammation. A large amount of stool seen in the rectum concerning for impaction. Vascular/Lymphatic: Aortic atherosclerosis. No enlarged abdominal or pelvic lymph nodes. Reproductive: Status post hysterectomy. No adnexal masses. Other: No abdominal wall hernia or abnormality. No abdominopelvic ascites. Musculoskeletal: No acute or significant osseous findings. IMPRESSION: Small right pleural effusion is noted with adjacent subsegmental atelectasis. Large amount of  stool seen in the rectum concerning for impaction. No other significant abnormality seen in the abdomen or pelvis. Aortic Atherosclerosis (ICD10-I70.0). Electronically Signed   By: Marijo Conception M.D.   On: 06/30/2022 18:31    Pending Labs Unresulted Labs (From admission, onward)     Start     Ordered   07/01/22 0500  Blood gas, venous  Tomorrow morning,   R        06/30/22 2115   06/30/22 2118  Strep pneumoniae urinary antigen  Add-on,   AD        06/30/22 2117   06/30/22 2118  Legionella Pneumophila Serogp 1 Ur Ag  Add-on,   AD        06/30/22 2117   06/30/22 1937  Lactic acid, plasma  Now then every 2 hours,   R (with STAT occurrences)      06/30/22 1939            Vitals/Pain Today's Vitals   07/01/22 0823 07/01/22 0830 07/01/22 0915 07/01/22 1000  BP:  (!) 154/84 131/83 (!) 142/80  Pulse:  87 95 91  Resp:  15 (!) 21 13  Temp:      TempSrc:      SpO2:  99% 98% 97%  PainSc: Asleep       Isolation Precautions Airborne and Contact precautions  Medications Medications  lactated ringers infusion (0 mLs Intravenous Stopped 07/01/22 0529)  acetaminophen (TYLENOL) tablet 650 mg (has no administration in time range)    Or  acetaminophen (TYLENOL) suppository 650 mg (has no administration in time range)  melatonin tablet 3 mg (has no administration in time range)  ondansetron (ZOFRAN) injection 4 mg (has no administration in time range)  benzonatate (TESSALON) capsule 200 mg (has no administration in time range)  menthol-cetylpyridinium (CEPACOL) lozenge 3 mg (has no administration in time range)  insulin aspart (novoLOG) injection 0-6 Units ( Subcutaneous Not Given 07/01/22 0801)  albuterol (PROVENTIL) (2.5 MG/3ML) 0.083% nebulizer solution 2.5 mg (has no administration in time range)  bisacodyl (DULCOLAX) suppository 10 mg (has no administration in time range)  fluticasone (FLONASE) 50 MCG/ACT nasal spray 2 spray (2 sprays Each Nare Given 07/01/22 0812)  metoprolol  succinate (TOPROL-XL) 24 hr tablet 12.5 mg (12.5 mg Oral Not Given 06/30/22 2318)  polyethylene glycol (MIRALAX / GLYCOLAX) packet 17 g (17 g Oral Given 07/01/22 0801)  senna-docusate (Senokot-S) tablet 1 tablet (1 tablet Oral Given 07/01/22 0801)  gadobutrol (GADAVIST) 1 MMOL/ML injection 9 mL (has  no administration in time range)  lactated ringers infusion (has no administration in time range)  ondansetron (ZOFRAN) injection 4 mg (4 mg Intravenous Given 06/30/22 1523)  lactated ringers bolus 1,000 mL (0 mLs Intravenous Stopped 06/30/22 2124)  iohexol (OMNIPAQUE) 350 MG/ML injection 75 mL (75 mLs Intravenous Contrast Given 06/30/22 1806)  vancomycin (VANCOREADY) IVPB 2000 mg/400 mL (0 mg Intravenous Stopped 07/01/22 0204)    Mobility walks with person assist     Focused Assessments Neuro Assessment Handoff:  Swallow screen pass? Yes          Neuro Assessment: Exceptions to [facility] Neuro Checks:      Has TPA been given? No If patient is a Neuro Trauma and patient is going to OR before floor call report to [place] nurse: [phone] or [phone]   R Recommendations: See Admitting Provider Note  Report given to:   Additional Notes:

## 2022-07-02 DIAGNOSIS — N19 Unspecified kidney failure: Secondary | ICD-10-CM | POA: Diagnosis not present

## 2022-07-02 DIAGNOSIS — I5032 Chronic diastolic (congestive) heart failure: Secondary | ICD-10-CM | POA: Diagnosis not present

## 2022-07-02 DIAGNOSIS — G9341 Metabolic encephalopathy: Secondary | ICD-10-CM

## 2022-07-02 DIAGNOSIS — K5900 Constipation, unspecified: Secondary | ICD-10-CM | POA: Diagnosis not present

## 2022-07-02 LAB — MENINGITIS/ENCEPHALITIS PANEL (CSF)

## 2022-07-02 LAB — RESPIRATORY PANEL BY PCR

## 2022-07-02 LAB — BLOOD GAS, ARTERIAL
Acid-Base Excess: 8.8 mmol/L — ABNORMAL HIGH (ref 0.0–2.0)
Bicarbonate: 33.5 mmol/L — ABNORMAL HIGH (ref 20.0–28.0)
Drawn by: 33176
O2 Saturation: 100 %
Patient temperature: 36.8
pCO2 arterial: 45 mmHg (ref 32–48)
pH, Arterial: 7.48 — ABNORMAL HIGH (ref 7.35–7.45)
pO2, Arterial: 116 mmHg — ABNORMAL HIGH (ref 83–108)

## 2022-07-02 LAB — CBC WITH DIFFERENTIAL/PLATELET
Abs Immature Granulocytes: 0 10*3/uL (ref 0.00–0.07)
Basophils Absolute: 0.1 10*3/uL (ref 0.0–0.1)
Basophils Relative: 1 %
Eosinophils Absolute: 0.1 10*3/uL (ref 0.0–0.5)
Eosinophils Relative: 1 %
HCT: 42.8 % (ref 36.0–46.0)
Hemoglobin: 13.6 g/dL (ref 12.0–15.0)
Lymphocytes Relative: 11 %
Lymphs Abs: 0.9 10*3/uL (ref 0.7–4.0)
MCH: 27.4 pg (ref 26.0–34.0)
MCHC: 31.8 g/dL (ref 30.0–36.0)
MCV: 86.1 fL (ref 80.0–100.0)
Monocytes Absolute: 1.7 10*3/uL — ABNORMAL HIGH (ref 0.1–1.0)
Monocytes Relative: 21 %
Neutro Abs: 5.3 10*3/uL (ref 1.7–7.7)
Neutrophils Relative %: 66 %
Platelets: 172 10*3/uL (ref 150–400)
RBC: 4.97 MIL/uL (ref 3.87–5.11)
RDW: 15.1 % (ref 11.5–15.5)
WBC: 8 10*3/uL (ref 4.0–10.5)
nRBC: 0 % (ref 0.0–0.2)

## 2022-07-02 LAB — CSF CELL COUNT WITH DIFFERENTIAL
RBC Count, CSF: 32 /mm3 — ABNORMAL HIGH
RBC Count, CSF: 875 /mm3 — ABNORMAL HIGH
Tube #: 1
Tube #: 4
WBC, CSF: 3 /mm3 (ref 0–5)
WBC, CSF: 3 /mm3 (ref 0–5)

## 2022-07-02 LAB — COMPREHENSIVE METABOLIC PANEL
ALT: 42 U/L (ref 0–44)
AST: 55 U/L — ABNORMAL HIGH (ref 15–41)
Albumin: 2.1 g/dL — ABNORMAL LOW (ref 3.5–5.0)
Alkaline Phosphatase: 67 U/L (ref 38–126)
Anion gap: 8 (ref 5–15)
BUN: 17 mg/dL (ref 8–23)
CO2: 27 mmol/L (ref 22–32)
Calcium: 9.9 mg/dL (ref 8.9–10.3)
Chloride: 109 mmol/L (ref 98–111)
Creatinine, Ser: 0.79 mg/dL (ref 0.44–1.00)
GFR, Estimated: >60 mL/min (ref >60)
Glucose, Bld: 129 mg/dL — ABNORMAL HIGH (ref 70–99)
Potassium: 3.4 mmol/L — ABNORMAL LOW (ref 3.5–5.1)
Sodium: 144 mmol/L (ref 135–145)
Total Bilirubin: 1.4 mg/dL — ABNORMAL HIGH (ref 0.3–1.2)
Total Protein: 6.7 g/dL (ref 6.5–8.1)

## 2022-07-02 LAB — GLUCOSE, CAPILLARY
Glucose-Capillary: 104 mg/dL — ABNORMAL HIGH (ref 70–99)
Glucose-Capillary: 125 mg/dL — ABNORMAL HIGH (ref 70–99)
Glucose-Capillary: 128 mg/dL — ABNORMAL HIGH (ref 70–99)
Glucose-Capillary: 128 mg/dL — ABNORMAL HIGH (ref 70–99)
Glucose-Capillary: 129 mg/dL — ABNORMAL HIGH (ref 70–99)

## 2022-07-02 LAB — MAGNESIUM: Magnesium: 2.2 mg/dL (ref 1.7–2.4)

## 2022-07-02 LAB — STREP PNEUMONIAE URINARY ANTIGEN: Strep Pneumo Urinary Antigen: NEGATIVE

## 2022-07-02 LAB — LEGIONELLA PNEUMOPHILA SEROGP 1 UR AG: L. pneumophila Serogp 1 Ur Ag: NEGATIVE

## 2022-07-02 LAB — VITAMIN B12: Vitamin B-12: 1172 pg/mL — ABNORMAL HIGH (ref 180–914)

## 2022-07-02 LAB — PROTEIN AND GLUCOSE, CSF
Glucose, CSF: 66 mg/dL (ref 40–70)
Total  Protein, CSF: 43 mg/dL (ref 15–45)

## 2022-07-02 LAB — PHOSPHORUS: Phosphorus: 2.3 mg/dL — ABNORMAL LOW (ref 2.5–4.6)

## 2022-07-02 MED ORDER — SODIUM CHLORIDE 3 % IN NEBU
4.0000 mL | INHALATION_SOLUTION | Freq: Four times a day (QID) | RESPIRATORY_TRACT | Status: DC
  Administered 2022-07-02 – 2022-07-03 (×4): 4 mL via RESPIRATORY_TRACT
  Filled 2022-07-02 (×7): qty 4

## 2022-07-02 MED ORDER — THIAMINE HCL 100 MG/ML IJ SOLN
250.0000 mg | Freq: Every day | INTRAVENOUS | Status: AC
  Administered 2022-07-04 – 2022-07-09 (×5): 250 mg via INTRAVENOUS
  Filled 2022-07-02 (×6): qty 2.5

## 2022-07-02 MED ORDER — GUAIFENESIN 100 MG/5ML PO LIQD: 5.0000 mL | ORAL | Status: DC | PRN

## 2022-07-02 MED ORDER — METOPROLOL TARTRATE 5 MG/5ML IV SOLN
5.0000 mg | Freq: Three times a day (TID) | INTRAVENOUS | Status: DC
  Administered 2022-07-02 – 2022-07-06 (×13): 5 mg via INTRAVENOUS
  Filled 2022-07-02 (×14): qty 5

## 2022-07-02 MED ORDER — POTASSIUM CHLORIDE 20 MEQ PO PACK: 40.0000 meq | PACK | ORAL | Status: AC

## 2022-07-02 MED ORDER — THIAMINE HCL 100 MG/ML IJ SOLN
100.0000 mg | Freq: Every day | INTRAMUSCULAR | Status: AC
  Administered 2022-07-10 – 2022-07-15 (×6): 100 mg via INTRAVENOUS
  Filled 2022-07-02 (×5): qty 2

## 2022-07-02 MED ORDER — ENSURE ENLIVE PO LIQD
237.0000 mL | Freq: Two times a day (BID) | ORAL | Status: DC
  Administered 2022-07-03: 237 mL via ORAL

## 2022-07-02 MED ORDER — POTASSIUM CHLORIDE 2 MEQ/ML IV SOLN
INTRAVENOUS | Status: DC
  Filled 2022-07-02 (×3): qty 1000

## 2022-07-02 MED ORDER — KCL-LACTATED RINGERS-D5W 20 MEQ/L IV SOLN
INTRAVENOUS | Status: DC
  Filled 2022-07-02: qty 1000

## 2022-07-02 MED ORDER — THIAMINE HCL 100 MG/ML IJ SOLN
500.0000 mg | Freq: Three times a day (TID) | INTRAVENOUS | Status: AC
  Administered 2022-07-02 – 2022-07-03 (×5): 500 mg via INTRAVENOUS
  Filled 2022-07-02 (×7): qty 5

## 2022-07-02 NOTE — Progress Notes (Signed)
Initial Nutrition Assessment  DOCUMENTATION CODES:   Not applicable  INTERVENTION:   Diet order/consistency per MD and SLP. Agree with only feeding patient when she is full alert and plan close monitoring due to pocketing/holding food, high risk for aspiration  Pt is Full Code. If aligns with GOC, pt may require Cortrak tube placement with initiation of TF if not tolerating po, not eating  Ensure Enlive po BID, each supplement provides 350 kcal and 20 grams of protein.   NUTRITION DIAGNOSIS:   Inadequate oral intake related to lethargy/confusion, dysphagia as evidenced by NPO status (pocketing/holding food).  GOAL:   Patient will meet greater than or equal to 90% of their needs  MONITOR:   PO intake, Supplement acceptance, Labs, Weight trends  REASON FOR ASSESSMENT:   Consult Assessment of nutrition requirement/status  ASSESSMENT:   82 yo female admitted with acute metabolic encephalopathy with dehydration, vomiting likely secondary to fecal impaction. PMH include chronic CHF, chronic respiratory failure, controlled DM 2  Pt currently on Dysphagia 1/Thin Liquid diet per SLP. Noted pt holding/pocketing food in mouth. Minimal po intake, if any.   RD attempted to complete full assessment x 2 today. On first visit, attending in room talking to several family members. On follow-up visit, neurology talking with family.   Current wt 88.1 kg; noted non-pitting edema present. Based on weight encounters, pt has experienced wt loss trend over the past several months  Noted pt has seen [facility] many times for DM education  Labs: sodium 144 (wdl), potassium 3.4 (L), phosphorus 2.3 (L) Meds; dulcolax suppository, ss novolog, magic mouthwash, cepacol lozenges milk and molasses enema, miralax, LR at 75 ml/hr   NUTRITION - FOCUSED PHYSICAL EXAM:  Unable to assess  Diet Order:   Diet Order             DIET - DYS 1 Room service appropriate? No; Fluid  consistency: Thin  Diet effective now                   EDUCATION NEEDS:   Not appropriate for education at this time  Skin:  Skin Assessment: Reviewed RN Assessment  Last BM:  PTA  Height:   Ht Readings from Last 1 Encounters:  06/18/22 5\' 5"  (1.651 m)    Weight:   Wt Readings from Last 1 Encounters:  07/01/22 88.1 kg    BMI:  Body mass index is 32.32 kg/m.  Estimated Nutritional Needs:   Kcal:  1550-1750 kcals  Protein:  75-85 g  Fluid:  >/= 1.5 L    Kerman Passey MS, RDN, LDN, CNSC Registered Dietitian 3 Clinical Nutrition RD Pager and On-Call Pager Number Located in [place]

## 2022-07-02 NOTE — Procedures (Addendum)
LUMBAR PUNCTURE (SPINAL TAP) PROCEDURE NOTE  Indication: Acute encephalopathy    Proceduralists: Beulah Gandy NP /Dr. Curly Shores    Risks, benefits and alternatives of the procedure were dicussed with the patient including but not limited to post-LP headache, bleeding, infection, weakness/numbness of legs(radiculopathy), death.    Consent obtained from: patient's daughter Pincus Large    Procedure Note The patient was prepped and draped, and using sterile technique a 20 gauge quinke spinal needle was inserted in the L3-4 space.   Approximately 9 cc of CSF were obtained and sent for analysis. CSF fluid at first was light pink tinged then was clear.  Patient tolerated the procedure well and blood loss was minimal.   Beulah Gandy DNP, ACNPC-AG  [facility]

## 2022-07-02 NOTE — Evaluation (Signed)
Occupational Therapy Evaluation Patient Details Name: Barbara Nelson MRN: [id number] DOB: Nov 12, 1940 Today's Date: 07/02/2022   History of Present Illness Pt is an 82 y/o female admitted for AMS in setting of HCAP PNA. Also noted dehydration and fecal impaction. Recent admission 2/1-2/10 for same. PMH: DM2, HTN, CHF.   Clinical Impression   PTA, pt from home with family, typically ambulatory with a walker and requires light assist for ADLs. Pt presents now with deficits in cognition, balance, endurance and overall strength. Pt requires Max-Total A x 2 for bed mobility and scooting attempts EOB. Pt declined standing attempts and noted with posterior bias when cued to initiate mobility. Pt requires Max-Total A x 2 for ADLs bed level d/t deficits. Family at bedside and eager to assist though unable to consistently provide the level of physical assist that pt currently requires. Rec SNF rehab at DC.  O2 probe w/ poor signal on digit, increased to 2 L O2 for activity. Switched to ear probe with improved readings at 98% on RA at end of session.     Recommendations for follow up therapy are one component of a multi-disciplinary discharge planning process, led by the attending physician.  Recommendations may be updated based on patient status, additional functional criteria and insurance authorization.   Follow Up Recommendations  Skilled nursing-short term rehab (<3 hours/day)     Assistance Recommended at Discharge Frequent or constant Supervision/Assistance  Patient can return home with the following Two people to help with walking and/or transfers;Two people to help with bathing/dressing/bathroom    Functional Status Assessment  Patient has had a recent decline in their functional status and demonstrates the ability to make significant improvements in function in a reasonable and predictable amount of time.  Equipment Recommendations  Wheelchair (measurements OT);Wheelchair cushion  (measurements OT)    Recommendations for Other Services       Precautions / Restrictions Precautions Precautions: Fall;Other (comment) Precaution Comments: monitor O2 Restrictions Weight Bearing Restrictions: No      Mobility Bed Mobility Overal bed mobility: Needs Assistance Bed Mobility: Supine to Sit, Sit to Supine, Rolling Rolling: +2 for physical assistance, +2 for safety/equipment, Total assist   Supine to sit: Max assist, +2 for physical assistance, +2 for safety/equipment Sit to supine: Total assist, +2 for physical assistance, +2 for safety/equipment   General bed mobility comments: Pt did initiate assist to scoot hips towards EOB but required assist in all other aspects. Total A x 2 to return to supine and Total A x 2 to roll for bed pad adjustment    Transfers Overall transfer level: Needs assistance                 General transfer comment: pt declined to attempt standing w/ noted posterior bias in resisting. Pt requires Total A x2 for lateral scooting along bedside      Balance Overall balance assessment: Needs assistance Sitting-balance support: Bilateral upper extremity supported, Feet supported Sitting balance-Leahy Scale: Poor Sitting balance - Comments: intermittent min guard/fair but with fatigue and prompts to attempt standing, pt with posterior bias and pushing back requiring overall Mod A to correct Postural control: Posterior lean                                 ADL either performed or assessed with clinical judgement   ADL Overall ADL's : Needs assistance/impaired Eating/Feeding: Maximal assistance;Bed level   Grooming: Sitting;Bed level;Total assistance  Grooming Details (indicate cue type and reason): sister wiping pt face off bed level; pt did not initiate to assist Upper Body Bathing: Total assistance;Sitting;Bed level   Lower Body Bathing: Total assistance;+2 for physical assistance;+2 for safety/equipment;Bed level    Upper Body Dressing : Total assistance;Sitting;Bed level   Lower Body Dressing: Total assistance;+2 for physical assistance;+2 for safety/equipment;Bed level       Toileting- Clothing Manipulation and Hygiene: Total assistance;+2 for safety/equipment;+2 for physical assistance;Bed level         General ADL Comments: Pt with lethargy though responding intermittently, significant weakness and poor endurance noted.     Vision Ability to See in Adequate Light: 0 Adequate Patient Visual Report: No change from baseline Vision Assessment?: No apparent visual deficits Additional Comments: to be further assessed; pt kept eyes closed much of session     Perception     Praxis      Pertinent Vitals/Pain Pain Assessment Pain Assessment: Faces Faces Pain Scale: No hurt     Hand Dominance Right   Extremity/Trunk Assessment Upper Extremity Assessment Upper Extremity Assessment: Generalized weakness   Lower Extremity Assessment Lower Extremity Assessment: Defer to PT evaluation   Cervical / Trunk Assessment Cervical / Trunk Assessment: Kyphotic   Communication Communication Communication: No difficulties   Cognition Arousal/Alertness: Awake/alert Behavior During Therapy: Flat affect Overall Cognitive Status: Impaired/Different from baseline Area of Impairment: Memory, Following commands, Problem solving, Attention, Safety/judgement, Awareness                   Current Attention Level: Sustained Memory: Decreased short-term memory Following Commands: Follows one step commands consistently, Follows one step commands with increased time Safety/Judgement: Decreased awareness of safety Awareness: Intellectual Problem Solving: Slow processing, Decreased initiation, Difficulty sequencing, Requires verbal cues General Comments: increased time to follow commands, requires multimodal cues and inconsistent at times. flat affect, eyes closed for much of session, requires cues for  safety and participation as pt leaning back unsafely EOB at times     General Comments  Sisters at bedside, one hands on to assist    Exercises     Shoulder Instructions      Home Living Family/patient expects to be discharged to:: Private residence Living Arrangements: Children Available Help at Discharge: Family;Available PRN/intermittently Type of Home: House Home Access: Stairs to enter [employer] of Steps: 1 Entrance Stairs-Rails: Can reach both Home Layout: Multi-level;Able to live on main level with bedroom/bathroom               Home Equipment: Toilet riser;Grab bars - toilet;Grab bars - tub/shower;Tub bench;Standard Walker   Additional Comments: Home setup from prior hospitalization as daughters not in room to provide setup (sisters in room were unsure). Per siblings, pt rotates living with her 2 daughters in different towns      Prior Functioning/Environment Prior Level of Function : Needs assist             Mobility Comments: pt ambulates for household distances with use of standard walker and supervision ADLs Comments: pt able to assist with ADLs, light iADL assist per sisters        OT Problem List: Decreased strength;Decreased activity tolerance;Impaired balance (sitting and/or standing);Decreased safety awareness;Decreased knowledge of use of DME or AE;Cardiopulmonary status limiting activity;Decreased cognition      OT Treatment/Interventions: Self-care/ADL training;Therapeutic exercise;DME and/or AE instruction;Balance training;Patient/family education;Cognitive remediation/compensation;Therapeutic activities    OT Goals(Current goals can be found in the care plan section) Acute Rehab OT Goals Patient Stated  Goal: sit EOB, attempt standing, get stronger - per family OT Goal Formulation: With patient Time For Goal Achievement: 07/16/22 Potential to Achieve Goals: Fair  OT Frequency: Min 2X/week    Co-evaluation PT/OT/SLP  Co-Evaluation/Treatment: Yes Reason for Co-Treatment: For patient/therapist safety;To address functional/ADL transfers   OT goals addressed during session: ADL's and self-care;Strengthening/ROM      AM-PAC OT "6 Clicks" Daily Activity     Outcome Measure Help from another person eating meals?: A Lot Help from another person taking care of personal grooming?: A Lot Help from another person toileting, which includes using toliet, bedpan, or urinal?: Total Help from another person bathing (including washing, rinsing, drying)?: Total Help from another person to put on and taking off regular upper body clothing?: Total Help from another person to put on and taking off regular lower body clothing?: Total 6 Click Score: 8   End of Session Nurse Communication: Mobility status  Activity Tolerance: Patient limited by fatigue;Patient limited by lethargy Patient left: in bed;with call bell/phone within reach;with bed alarm set;with family/visitor present  OT Visit Diagnosis: Unsteadiness on feet (R26.81);Muscle weakness (generalized) (M62.81);Other abnormalities of gait and mobility (R26.89);Other symptoms and signs involving cognitive function                Time: 0814-4818 OT Time Calculation (min): 26 min Charges:  OT General Charges $OT Visit: 1 Visit OT Evaluation $OT Eval Moderate Complexity: 1 Mod  Malachy Chamber, OTR/L Acute Rehab Services Office: [phone]   Layla Maw 07/02/2022, 11:18 AM

## 2022-07-02 NOTE — Progress Notes (Signed)
PROGRESS NOTE  Barbara Nelson DGL:[id number] DOB: 1940-11-18   PCP: Cari Caraway, MD  Patient is from: SNF  DOA: 06/30/2022 LOS: 2  Chief complaints Chief Complaint  Patient presents with   Fatigue   Abdominal Pain   Emesis     Brief Narrative / Interim history: 82 year old F with PMH of diastolic CHF, chronic hypoxic RF on 2 L, HTN, and recent hospitalization from 2/1-10 with CAP and acute on chronic CHF returning with altered mental status, lethargy and vomiting, and admitted with working diagnosis of HCAP and encephalopathy.  Family denies history of dementia or cognitive impairment.  They significant decline in cognition since she was diagnosed with shingles in January and started on gabapentin.  She was weaned off gabapentin but she continued to decline, even worse after her recent hospitalization.  She is currently not on any sedating medication.  In ED, slightly hypertensive.  Not febrile.  Saturating in upper 90s to 100 on 2 L.  No leukocytosis.  Hgb 15.2 (12.4 about 12 days ago).  Slightly elevated LFT and Cr.  Lipase, BNP, ammonia, TSH, CK and procalcitonin within normal.  Lactic acid 2.1.  VBG suggess mild metabolic alkalosis.  UDS negative.  UA with > 300 protein.  XR with minimal strandy and hazy opacity at both lung bases suggesting atelectasis or infection, small left pleural effusion and stable cardiomegaly.  CT abdomen and pelvis with contrast showed large amount of stool in the rectum concerning for impaction.  She had disimpaction in ED.  CT head without acute finding.  MRI brain without acute finding but limited by motion.  Patient was started on IVF, IV cefepime and vancomycin and admitted with working diagnosis of HCAP, encephalopathy and dehydration.  The next day, pneumonia felt to be less likely.  Blood cultures negative.  Antibiotic discontinued.  Further encephalopathy workup unrevealing.  Continued on IV fluid.  Patient's encephalopathy worse.  Started  high-dose thiamine.  Neurology consulted.   Subjective: Seen and examined earlier this morning.  Patient remains encephalopathic, looks worse today.  She is sleepy but wakes to voice.  Barely follows commands.  No focal neurodeficit but limited.  Multiple family members including daughter, two sisters and other at bedside.  Objective: Vitals:   07/02/22 0500 07/02/22 0754 07/02/22 1202 07/02/22 1214  BP: (!) 141/80 (!) 133/92 (!) 142/77   Pulse: (!) 104 85 (!) 107   Resp: 17 15 15    Temp: 98.5 F (36.9 C) 98.2 F (36.8 C) 98.4 F (36.9 C)   TempSrc: Axillary Axillary Axillary   SpO2:  91% 100% 92%  Weight:        Examination: GENERAL: No apparent distress.  Nontoxic. HEENT: MMM.  Vision and hearing grossly intact.  NECK: Supple.  No apparent JVD.  RESP:  No IWOB.  Fair aeration bilaterally. CVS:  RRR. Heart sounds normal.  ABD/GI/GU: BS+. Abd soft, NTND.  MSK/EXT:   No apparent deformity. Moves extremities. No edema.  SKIN: no apparent skin lesion or wound NEURO: Sleepy.  Briefly wakes to voice and falls back to sleep.  Barely follows commands.  PERRL.  No facial asymmetry.  Patellar reflex symmetric.  No apparent focal neuro deficit but limited exam due to mental status. PSYCH: Calm.  No agitation.  Procedures:  None  Microbiology summarized: [id number], influenza and RSV PCR nonreactive MRSA PCR screen nonreactive Blood cultures NGTD  Assessment and plan: Principal Problem:   Acute metabolic encephalopathy Active Problems:   Acute hypoxic respiratory failure ([place])  Essential hypertension   Hypercalcemia   Dehydration   Acute prerenal azotemia   Transaminitis   Constipation   DM2 (diabetes mellitus, type 2) ([facility])   Allergic rhinitis   Chronic diastolic CHF (congestive heart failure) ([place])  Acute metabolic encephalopathy: Initially felt to be dehydration and delirium but mental status worse despite hydration.  Encephalopathy workup including CT head, MRI brain,  ammonia, TSH, VBG, UDS and B12 unrevealing.  She has no focal neurodeficit.  Of note, she was diagnosed with shingles about a month ago and started on gabapentin when her mental status started to decline.  She had further decline in cognition after her recent hospitalization.  She has had poor p.o. intake over the last 1 months. -Neurology consulted. -Reorientation and delirium precaution -Avoid sedating medications -Check vitamin B1.  Started high-dose thiamine. -Continue IV fluid hydration and hold diuretics -Fall and aspiration precaution. -PT/OT/SLP eval -Dietitian consulted.  Dehydration: Patient looks dehydrated as evidenced by hemoconcentration, elevated creatinine, urinary concentration and mild hypercalcemia.  Family reports emesis.  CT abdomen and pelvis shows rectal impaction which could be contributing to emesis.  She has disimpaction in ED. -Continue IV fluid hydration -Continue holding diuretics  Vomiting: Likely due to fecal impaction.  She has mildly elevated LFT that seems to be improved.  CT abdomen and pelvis without acute finding other than fecal impaction.  RUQ [place] negative. -Continue bowel regimen -Antiemetics as needed -Continue IV fluid  Constipation/fecal impaction -Try Dulcolax suppository and digital disimpaction  Elevated LFT: Pattern consistent with EtOH and rhabdo but patient does not drink.  CK within normal.  LFT improved.  RUQ [place] negative.  AST improved. -Continue monitoring  Chronic diastolic CHF: TTE on 2/2 with LVEF of 50 to 55% and G1-DD.  She was somewhat dehydrated on presentation.  She seems to be on Lasix.  No CHF on chest x-ray. -Continue holding Lasix -Strict intake and output, daily weights, renal functions and electrolytes  Chronic hypoxic respiratory failure: On 2 L at baseline.  Currently on room air. -Minimum oxygen to keep saturation above 90%. -Try incentive spirometry and pulmonary toilet  Controlled IDDM-2 with hyperglycemia: A1c  6.9%. -Continue current insulin regimen  Hypercalcemia: Likely secondary due to dehydration.  Resolved with hydration.  Generalized weakness/lethargy: Likely due to dehydration and encephalopathy -PT/OT eval as above  HCAP/pneumonia ruled out: No fever, leukocytosis.  Procalcitonin within normal.  COVID-19, influenza and RSV PCR nonreactive.  MRSA PCR negative.  Blood cultures NGTD.  Cough likely due to silent aspiration in the setting of encephalopathy and vomiting. -Pulmonology consulted at family request. -Monitor off antibiotics. -Discontinue Tessalon in the setting of encephalopathy -Pulmonary toilet -Aspiration precaution  Body mass index is 32.32 kg/m.  Nutrition Problem: Inadequate oral intake Etiology: lethargy/confusion, dysphagia Signs/Symptoms: NPO status (pocketing/holding food) Interventions: Refer to RD note for recommendations, Ensure Enlive (each supplement provides 350kcal and 20 grams of protein)   DVT prophylaxis:  enoxaparin (LOVENOX) injection 40 mg Start: 07/01/22 1230 SCDs Start: 06/30/22 2019  Code Status: Full code Family Communication: Updated patient's daughter sisters and other family members at bedside and over the phone Level of care: Med-Surg Status is: Inpatient Remains inpatient appropriate because: Acute metabolic encephalopathy, dehydration and lethargy.   Final disposition: Likely SNF. Consultants:  Neurology Pulmonology  55 minutes with more than 50% spent in reviewing records, counseling patient/family and coordinating care.   Sch Meds:  Scheduled Meds:  bisacodyl  10 mg Rectal Daily   enoxaparin (LOVENOX) injection  40 mg Subcutaneous Q24H  feeding supplement  237 mL Oral BID BM   fluticasone  2 spray Each Nare q morning   insulin aspart  0-6 Units Subcutaneous TID WC   magic mouthwash w/lidocaine  5 mL Oral QID   metoprolol succinate  12.5 mg Oral Daily   polyethylene glycol  17 g Oral BID   potassium chloride  40 mEq Oral  Q4H   sodium chloride HYPERTONIC  4 mL Nebulization Q6H   [START ON 07/10/2022] thiamine (VITAMIN B1) injection  100 mg Intravenous Daily   Continuous Infusions:  lactated ringers 75 mL/hr at 07/01/22 1725   thiamine (VITAMIN B1) injection 500 mg (07/02/22 1335)   Followed by   Derrill Memo ON 07/04/2022] thiamine (VITAMIN B1) injection     PRN Meds:.acetaminophen **OR** acetaminophen, gadobutrol, guaiFENesin, ipratropium-albuterol, menthol-cetylpyridinium, milk and molasses, ondansetron (ZOFRAN) IV  Antimicrobials: Anti-infectives (From admission, onward)    Start     Dose/Rate Route Frequency Ordered Stop   07/01/22 2200  vancomycin (VANCOCIN) IVPB 1000 mg/200 mL premix  Status:  Discontinued        1,000 mg 200 mL/hr over 60 Minutes Intravenous Every 24 hours 06/30/22 2136 07/01/22 1047   07/01/22 0800  ceFEPIme (MAXIPIME) 2 g in sodium chloride 0.9 % 100 mL IVPB  Status:  Discontinued        2 g 200 mL/hr over 30 Minutes Intravenous Every 8 hours 07/01/22 0735 07/01/22 1047   06/30/22 2145  ceFEPIme (MAXIPIME) 2 g in sodium chloride 0.9 % 100 mL IVPB  Status:  Discontinued        2 g 200 mL/hr over 30 Minutes Intravenous Every 12 hours 06/30/22 2136 07/01/22 0735   06/30/22 2145  vancomycin (VANCOREADY) IVPB 2000 mg/400 mL        2,000 mg 200 mL/hr over 120 Minutes Intravenous  Once 06/30/22 2136 07/01/22 0204        I have personally reviewed the following labs and images: CBC: Recent Labs  Lab 06/30/22 1406 06/30/22 2000 07/01/22 0420 07/01/22 0830 07/02/22 0834  WBC 7.2  --  9.3  --  8.0  NEUTROABS 4.7  --  6.1  --  5.3  HGB 15.2* 15.0 13.1 13.9 13.6  HCT 46.8* 44.0 40.5 41.0 42.8  MCV 87.5  --  87.1  --  86.1  PLT 192  --  223  --  172   BMP &GFR Recent Labs  Lab 06/30/22 1406 06/30/22 1938 06/30/22 2000 07/01/22 0420 07/01/22 0830 07/02/22 0834  NA 140  --  140 138 144 144  K 4.5  --  4.3 4.0 3.7 3.4*  CL 98  --   --  101  --  109  CO2 31  --   --  29  --   27  GLUCOSE 164*  --   --  136*  --  129*  BUN 20  --   --  19  --  17  CREATININE 0.97  --   --  0.81  --  0.79  CALCIUM 10.3  --   --  9.6  --  9.9  MG  --  2.3  --  2.3  --  2.2  PHOS  --  3.1  --  2.7  --  2.3*   Estimated Creatinine Clearance: 59.4 mL/min (by C-G formula based on SCr of 0.79 mg/dL). Liver & Pancreas: Recent Labs  Lab 06/30/22 1406 07/01/22 0420 07/02/22 0834  AST 113* 72* 55*  ALT 74* 45* 42  ALKPHOS 89 67 67  BILITOT 0.5 1.4* 1.4*  PROT 8.3* 6.8 6.7  ALBUMIN 2.5* 2.0* 2.1*   Recent Labs  Lab 06/30/22 1406  LIPASE 24   Recent Labs  Lab 06/30/22 1952  AMMONIA 15   Diabetic: No results for input(s): "HGBA1C" in the last 72 hours. Recent Labs  Lab 07/01/22 1132 07/01/22 1705 07/01/22 2358 07/02/22 0751 07/02/22 1205  GLUCAP 124* 154* 128* 129* 125*   Cardiac Enzymes: Recent Labs  Lab 06/30/22 2015  CKTOTAL 21*   No results for input(s): "PROBNP" in the last 8760 hours. Coagulation Profile: Recent Labs  Lab 07/01/22 0505  INR 1.0   Thyroid Function Tests: Recent Labs    06/30/22 1952  TSH 1.727   Lipid Profile: No results for input(s): "CHOL", "HDL", "LDLCALC", "TRIG", "CHOLHDL", "LDLDIRECT" in the last 72 hours. Anemia Panel: Recent Labs    07/02/22 0834  VITAMINB12 1,172*   Urine analysis:    Component Value Date/Time   COLORURINE YELLOW 07/01/2022 0610   APPEARANCEUR CLEAR 07/01/2022 0610   LABSPEC 1.038 (H) 07/01/2022 0610   PHURINE 5.0 07/01/2022 0610   GLUCOSEU NEGATIVE 07/01/2022 0610   HGBUR NEGATIVE 07/01/2022 0610   BILIRUBINUR NEGATIVE 07/01/2022 0610   KETONESUR 5 (A) 07/01/2022 0610   PROTEINUR >=300 (A) 07/01/2022 0610   UROBILINOGEN 0.2 09/15/2011 0530   NITRITE NEGATIVE 07/01/2022 0610   LEUKOCYTESUR NEGATIVE 07/01/2022 0610   Sepsis Labs: Invalid input(s): "PROCALCITONIN", "LACTICIDVEN"  Microbiology: Recent Results (from the past 240 hour(s))  Resp panel by RT-PCR (RSV, Flu A&B, Covid)  Anterior Nasal Swab     Status: None   Collection Time: 06/30/22  1:31 PM   Specimen: Anterior Nasal Swab  Result Value Ref Range Status   SARS Coronavirus 2 by RT PCR NEGATIVE NEGATIVE Final   Influenza A by PCR NEGATIVE NEGATIVE Final   Influenza B by PCR NEGATIVE NEGATIVE Final    Comment: (NOTE) The Xpert Xpress SARS-CoV-2/FLU/RSV plus assay is intended as an aid in the diagnosis of influenza from Nasopharyngeal swab specimens and should not be used as a sole basis for treatment. Nasal washings and aspirates are unacceptable for Xpert Xpress SARS-CoV-2/FLU/RSV testing.  Fact Sheet for Patients: EntrepreneurPulse.com.au  Fact Sheet for Healthcare Providers: IncredibleEmployment.be  This test is not yet approved or cleared by the [place] FDA and has been authorized for detection and/or diagnosis of SARS-CoV-2 by FDA under an Emergency Use Authorization (EUA). This EUA will remain in effect (meaning this test can be used) for the duration of the COVID-19 declaration under Section 564(b)(1) of the Act, 21 U.S.C. section 360bbb-3(b)(1), unless the authorization is terminated or revoked.     Resp Syncytial Virus by PCR NEGATIVE NEGATIVE Final    Comment: (NOTE) Fact Sheet for Patients: EntrepreneurPulse.com.au  Fact Sheet for Healthcare Providers: IncredibleEmployment.be  This test is not yet approved or cleared by the [place] FDA and has been authorized for detection and/or diagnosis of SARS-CoV-2 by FDA under an Emergency Use Authorization (EUA). This EUA will remain in effect (meaning this test can be used) for the duration of the COVID-19 declaration under Section 564(b)(1) of the Act, 21 U.S.C. section 360bbb-3(b)(1), unless the authorization is terminated or revoked.  Performed at [facility], [place] [address] [place], [place] [address]   Culture, blood (routine x 2)     Status:  None (Preliminary result)   Collection Time: 06/30/22  7:52 PM   Specimen: BLOOD  Result Value Ref Range Status  Specimen Description BLOOD LEFT ANTECUBITAL  Final   Special Requests   Final    BOTTLES DRAWN AEROBIC AND ANAEROBIC Blood Culture adequate volume   Culture   Final    NO GROWTH 2 DAYS Performed at [facility], [address] [place], [place] [address]    Report Status PENDING  Incomplete  Culture, blood (routine x 2)     Status: None (Preliminary result)   Collection Time: 06/30/22  8:28 PM   Specimen: BLOOD LEFT HAND  Result Value Ref Range Status   Specimen Description BLOOD LEFT HAND  Final   Special Requests   Final    BOTTLES DRAWN AEROBIC AND ANAEROBIC Blood Culture adequate volume   Culture   Final    NO GROWTH 2 DAYS Performed at [facility], [place] [address] [place], [place] [address]    Report Status PENDING  Incomplete  MRSA Next Gen by PCR, Nasal     Status: None   Collection Time: 06/30/22 11:21 PM  Result Value Ref Range Status   MRSA by PCR Next Gen NOT DETECTED NOT DETECTED Final    Comment: (NOTE) The GeneXpert MRSA Assay (FDA approved for NASAL specimens only), is one component of a comprehensive MRSA colonization surveillance program. It is not intended to diagnose MRSA infection nor to guide or monitor treatment for MRSA infections. Test performance is not FDA approved in patients less than 40 years old. Performed at [facility], [place] [address], [place],  [address]     Radiology Studies: No results found.    Leslee Haueter T. [place]  If 7PM-7AM, please contact night-coverage [email or web address] 07/02/2022, 4:14 PM

## 2022-07-02 NOTE — Progress Notes (Signed)
Speech Language Pathology Treatment: Dysphagia  Patient Details Name: Barbara Nelson MRN: [id number] DOB: [birth year] Today's Date: 07/02/2022 Time: 8416-6063 SLP Time Calculation (min) (ACUTE ONLY): 15 min  Assessment / Plan / Recommendation Clinical Impression  Pt seen for dysphagia intervention with daughter and other family members present. Unfortunately she continues to demonstrate a cognitive based dysphagia exacerbated by lethargy, decreased awareness of self and environment. She will open eyes when asked, then closes and responded "yes" to several questions. Very reduced ability to manipulate and transit bolus with teaspoon sips thin (was unable to extract liquid via straw) and just held the liquid as well as one trial of applesauce. Verbal, tactile cues with dry spoons to facilitate swallow were not effective. There was only one weak and delayed swallow elicited throughout session. Attempted to remove small amount applesauce from oral cavity but was was not in mouth when inspected and may be in pharynx- the swallow was at the end of the session hopefully clearing po's. Educated family re: her current cognitive/alertness status that is preventing her from being able to take po. Family, RN can try again at lunch to see if she is able to transit boluses. Encouraged family to continue to attempt to interact with pt to orient and try to keep her awake for certain periods of the day. ST will continue to see.    HPI HPI: Pt is a 82 y.o. female who presented with AMS and was admitted with acute metabolic encephalopathy in the setting of HCAP pneumonia. CXR 2/13: Minimal strandy and hazy opacities in both lung bases may  represent atelectasis or infection. PMH: type 2 diabetes mellitus, essential hypertension, chronic diastolic heart failure, chronic hypoxic respiratory failure. BSE 06/20/22 regular texture diet with thin liquids recommended.      SLP Plan  Continue with current plan of care       Recommendations for follow up therapy are one component of a multi-disciplinary discharge planning process, led by the attending physician.  Recommendations may be updated based on patient status, additional functional criteria and insurance authorization.    Recommendations  Diet recommendations: Dysphagia 1 (puree);Thin liquid Liquids provided via: Teaspoon;Straw Medication Administration: Crushed with puree Supervision: Patient able to self feed;Staff to assist with self feeding;Intermittent supervision to cue for compensatory strategies Compensations: Slow rate;Small sips/bites Postural Changes and/or Swallow Maneuvers: Seated upright 90 degrees                Oral Care Recommendations: Oral care BID Follow Up Recommendations:  (TBD) Assistance recommended at discharge: Frequent or constant Supervision/Assistance SLP Visit Diagnosis: Dysphagia, unspecified (R13.10) Plan: Continue with current plan of care           Houston Siren  07/02/2022, 8:54 AM

## 2022-07-02 NOTE — Evaluation (Signed)
Physical Therapy Evaluation Patient Details Name: Barbara Nelson MRN: [id number] DOB: 01/09/1941 Today's Date: 07/02/2022  History of Present Illness  Pt is an 82 y/o female admitted for AMS in setting of HCAP PNA. Also noted dehydration and fecal impaction. Recent admission 2/1-2/10 for same. PMH: DM2, HTN, CHF.  Clinical Impression  Pt presents today with impaired functional mobility, limited by strength, balance, cognition, and overall deconditioning. Pt was recently admitted and able to ambulate in the room with RW and assistance, however pt with regression, now requiring maxAx2-TotalAx2 for bed mobility, but able to maintain sitting EOB with minG-modA varying with fatigue. Pt will continue to benefit from skilled acute PT at this time to progress mobility and address deficits, recommend SNF at discharge for further progression of mobility. Acute PT will continue to follow as appropriate.        Recommendations for follow up therapy are one component of a multi-disciplinary discharge planning process, led by the attending physician.  Recommendations may be updated based on patient status, additional functional criteria and insurance authorization.  Follow Up Recommendations Skilled nursing-short term rehab (<3 hours/day) Can patient physically be transported by private vehicle: No    Assistance Recommended at Discharge Frequent or constant Supervision/Assistance  Patient can return home with the following  Assistance with cooking/housework;Assist for transportation;Help with stairs or ramp for entrance;Two people to help with walking and/or transfers;Two people to help with bathing/dressing/bathroom;Direct supervision/assist for medications management    Equipment Recommendations None recommended by PT  Recommendations for Other Services       Functional Status Assessment Patient has had a recent decline in their functional status and demonstrates the ability to make significant  improvements in function in a reasonable and predictable amount of time.     Precautions / Restrictions Precautions Precautions: Fall;Other (comment) Precaution Comments: monitor O2 Restrictions Weight Bearing Restrictions: No      Mobility  Bed Mobility Overal bed mobility: Needs Assistance Bed Mobility: Supine to Sit, Sit to Supine, Rolling Rolling: Total assist, +2 for physical assistance, +2 for safety/equipment   Supine to sit: Max assist, +2 for physical assistance, +2 for safety/equipment, HOB elevated Sit to supine: Total assist, +2 for physical assistance, +2 for safety/equipment, HOB elevated   General bed mobility comments: pt with mild initiation for supine>sit and to scoot to the edge of the bed with use of the bed pads. TotalAx2 for return to supine and for rolling with use of siderails to adjust bed pads    Transfers Overall transfer level: Needs assistance                 General transfer comment: pt declined any attempts at standing or transferring to chair, noted increase in posterior lean when mentioning standing. Pt scooted along EOB with totalAx2 to reposition prior to returning to supine    Ambulation/Gait                  Stairs            Wheelchair Mobility    Modified Rankin (Stroke Patients Only)       Balance Overall balance assessment: Needs assistance Sitting-balance support: Bilateral upper extremity supported, Feet supported Sitting balance-Leahy Scale: Poor Sitting balance - Comments: overall pt requiring minA to minG while seated EOB but progressing up to modA with fatigue and after mentioning standing to correct posterior lean Postural control: Posterior lean  Pertinent Vitals/Pain Pain Assessment Pain Assessment: Faces Faces Pain Scale: No hurt    Home Living Family/patient expects to be discharged to:: Private residence Living Arrangements:  Children Available Help at Discharge: Family;Available PRN/intermittently Type of Home: House Home Access: Stairs to enter Entrance Stairs-Rails: Can reach both Entrance Stairs-Number of Steps: 1 Alternate Level Stairs-Number of Steps: pt stays on first level Home Layout: Multi-level;Able to live on main level with bedroom/bathroom Home Equipment: Toilet riser;Grab bars - toilet;Grab bars - tub/shower;Tub bench;Standard Walker Additional Comments: Pt rotates between daughter's homes, home set up utilized from prior admission as sisters were at bedside today    Prior Function Prior Level of Function : Needs assist             Mobility Comments: Pt utilizing standard walker at baseline, mobilizing household distances ADLs Comments: pt able to assist with ADLs, light iADL assist per sisters     Hand Dominance   Dominant Hand: Right    Extremity/Trunk Assessment   Upper Extremity Assessment Upper Extremity Assessment: Defer to OT evaluation    Lower Extremity Assessment Lower Extremity Assessment: Generalized weakness    Cervical / Trunk Assessment Cervical / Trunk Assessment: Kyphotic  Communication   Communication: No difficulties  Cognition Arousal/Alertness: Awake/alert Behavior During Therapy: Flat affect Overall Cognitive Status: Impaired/Different from baseline Area of Impairment: Memory, Following commands, Problem solving, Attention, Safety/judgement, Awareness                   Current Attention Level: Sustained Memory: Decreased short-term memory Following Commands: Follows one step commands consistently, Follows one step commands with increased time Safety/Judgement: Decreased awareness of safety Awareness: Intellectual Problem Solving: Slow processing, Decreased initiation, Difficulty sequencing, Requires verbal cues General Comments: pt requiring increased time to follow commands with use of verbal and visual cues. Pt not very verbal during today's  session which is abnormal from family's report, pt with eyes closed a majority of the time but opening with cues        General Comments General comments (skin integrity, edema, etc.): O2 at 1L upon arrival, pleth with poor readings throughout session but placed on 2L prior to mobility. At end of session replaced monitor to earlobe with good reading, tolerating room air with SPO2 of 98% at rest, remained on room air and RN aware    Exercises     Assessment/Plan    PT Assessment Patient needs continued PT services  PT Problem List Decreased strength;Decreased activity tolerance;Decreased balance;Decreased mobility;Decreased knowledge of use of DME;Cardiopulmonary status limiting activity       PT Treatment Interventions DME instruction;Gait training;Functional mobility training;Stair training;Therapeutic activities;Therapeutic exercise;Balance training;Neuromuscular re-education;Patient/family education    PT Goals (Current goals can be found in the Care Plan section)  Acute Rehab PT Goals Patient Stated Goal: family reports goal is to get more independent to return home PT Goal Formulation: With patient/family Time For Goal Achievement: 07/16/22 Potential to Achieve Goals: Fair    Frequency Min 3X/week     Co-evaluation PT/OT/SLP Co-Evaluation/Treatment: Yes Reason for Co-Treatment: For patient/therapist safety;Necessary to address cognition/behavior during functional activity PT goals addressed during session: Mobility/safety with mobility;Balance OT goals addressed during session: ADL's and self-care;Strengthening/ROM       AM-PAC PT "6 Clicks" Mobility  Outcome Measure Help needed turning from your back to your side while in a flat bed without using bedrails?: Total Help needed moving from lying on your back to sitting on the side of a flat bed without using bedrails?: Total  Help needed moving to and from a bed to a chair (including a wheelchair)?: Total Help needed  standing up from a chair using your arms (e.g., wheelchair or bedside chair)?: Total Help needed to walk in hospital room?: Total Help needed climbing 3-5 steps with a railing? : Total 6 Click Score: 6    End of Session Equipment Utilized During Treatment: Oxygen;Gait belt Activity Tolerance: Patient limited by fatigue Patient left: with call bell/phone within reach;with family/visitor present;in bed;with bed alarm set Nurse Communication: Mobility status PT Visit Diagnosis: Difficulty in walking, not elsewhere classified (R26.2);Muscle weakness (generalized) (M62.81)    Time: 2426-8341 PT Time Calculation (min) (ACUTE ONLY): 24 min   Charges:   PT Evaluation $PT Eval Moderate Complexity: 1 Mod          Charlynne Cousins, PT DPT Acute Rehabilitation Services Office [phone]   Luvenia Heller 07/02/2022, 11:38 AM

## 2022-07-02 NOTE — Consult Note (Signed)
NAME:  Barbara Nelson, MRN:  [id number], DOB:  09-06-1940, LOS: 2 ADMISSION DATE:  06/30/2022, CONSULTATION DATE:  07/02/22 REFERRING MD:  Cyndia Skeeters CHIEF COMPLAINT:  Cough, AMS   History of Present Illness:  Barbara Nelson is a 82 y.o. female who has a PMH as below including but not limited to pulmonary blastomycosis s/p RLL lobectomy 2014, pulmonary nodules. She presented to [facility] ED 2/13 with AMS and ongoing cough. She was just admitted 06/18/22 through 06/26/22 for CAP, acute hypoxic respiratory failure, AoC dCHF and was discharged to SNF rehab after a course of Levaquin along with diuresis. Had initially done alright; however, after a day or so at rehab, had somnolence that waxed and waned along with AMS. She also had cough productive of thick clear sputum per her family members at the bedside. They tell me that she does cough but is unable to completely cough up her sputum and so they have to assist with manually removing it from mouth. She had a couple episodes of nonbilious/nonbloody vomiting in the 24-48 hours prior to ED presentation.  She was admitted with working diagnosis HCAP and was started on Vanc/Cefepime. Her abx were stopped 2/14 after she had normal labs including normal PCT and remained afebrile. Flu/RSV/COVID were all negative.   2/15, family requested pulmonary and neurology consultation given ongoing cough and sputum production as well as ongoing AMS. She is very somnolent but does arouse to voice and noxious stimuli. She does not follow commands. She does withdraw and does have intact cough/gag. Her encephalopathy workup including UDS, ammonia, TSH, VBG, CT head, MRI brain are all negative.   Pertinent  Medical History:  has Pericardial effusion/ pericarditis; Pulmonary nodule, right; History of pericardiectomy; Pericarditis, viral; Unspecified atrial fibrillation ([place]); Lung mass; Edema of extremities; Benign essential HTN; Heart palpitations; LBBB (left bundle branch block); Body  mass index (BMI) 39.0-39.9, adult; Chronic pulmonary blastomycosis ([place]); Decreased estrogen level; Prediabetes; Dry eyes; History of pericarditis; Essential hypertension; Acute respiratory failure with hypoxia ([place]); Hyponatremia; Shortness of breath; Acute metabolic encephalopathy; Hypercalcemia; Dehydration; Acute prerenal azotemia; Transaminitis; Constipation; DM2 (diabetes mellitus, type 2) ([place]); Allergic rhinitis; and Chronic diastolic CHF (congestive heart failure) ([place]) on their problem list.  Significant Hospital Events: Including procedures, antibiotic start and stop dates in addition to other pertinent events   2/13 admit 2/15 PCCM and neuro consults  Interim History / Subjective:  On 4L, sats 88-96%. She is altered and encephalopathic. Does open eyes to voice and noxious stimuli and will withdraw but otherwise does not follow commands. Not much sputum noted during coughing and minimal secretions suctioned with yaunker. She does have oral thrush which family states she has had for several days for which she was getting magic mouth wash for.  Objective:  Blood pressure (!) 142/77, pulse (!) 107, temperature 98.4 F (36.9 C), temperature source Axillary, resp. rate 15, weight 88.1 kg, SpO2 92 %.        Intake/Output Summary (Last 24 hours) at 07/02/2022 1237 Last data filed at 07/01/2022 2300 Gross per 24 hour  Intake --  Output 450 ml  Net -450 ml   Filed Weights   07/01/22 1346 07/01/22 1817  Weight: 88.1 kg 88.1 kg    Examination: General: Elderly female, resting in bed, in NAD. Neuro: Somnolent, encephalopathic. Does open eyes and withdraw to noxious stimuli but does not follow commands. HEENT: [place]/AT. Sclerae anicteric. MM dry. Oral thrush noted. Cardiovascular: RRR, no M/R/G.  Lungs: Respirations even and unlabored.  CTA  bilaterally, No W/R/R. Abdomen: BS x 4, soft, NT/ND.  Musculoskeletal: No gross deformities, no edema.  Skin: Intact, warm, no  rashes.  Labs/imaging personally reviewed:  CT head 2/13 > negative. CT abd/pelv 2/13 > small R effusion, large amount of stool in rectum. MRI brain 2/13 > negative. RUQ [place] 2/14 > negative.   Assessment & Plan:   Acute metabolic encephalopathy - unclear etiology at this point. Workup including UDS, ammonia, TSH, VBG, CT head, MRI brain are all negative. ABG this AM also normal. - Supportive care. - Avoid sedating meds. - Push normal sleep/wake cycle.  Cough with thick clear sputum - Agree unlikely infectious PNA but certainly could be 2/2 chronic aspiration (though family does not provide any hx to suggest this). - Add on RVP. - Add Hypertonic Nebs. - Add chest PT. - Continue Guaifenesin when able to take PO. - Agree with SLP eval.  Oral thrush. - Magic Mouthwash per primary.   Nothing further to add.  PCCM will sign off.  Please call us back if we can be of any further assistance.   Best practice (evaluated daily):  Per primary team.  Labs   CBC: Recent Labs  Lab 06/30/22 1406 06/30/22 2000 07/01/22 0420 07/01/22 0830 07/02/22 0834  WBC 7.2  --  9.3  --  8.0  NEUTROABS 4.7  --  6.1  --  5.3  HGB 15.2* 15.0 13.1 13.9 13.6  HCT 46.8* 44.0 40.5 41.0 42.8  MCV 87.5  --  87.1  --  86.1  PLT 192  --  223  --  387    Basic Metabolic Panel: Recent Labs  Lab 06/30/22 1406 06/30/22 1938 06/30/22 2000 07/01/22 0420 07/01/22 0830 07/02/22 0834  NA 140  --  140 138 144 144  K 4.5  --  4.3 4.0 3.7 3.4*  CL 98  --   --  101  --  109  CO2 31  --   --  29  --  27  GLUCOSE 164*  --   --  136*  --  129*  BUN 20  --   --  19  --  17  CREATININE 0.97  --   --  0.81  --  0.79  CALCIUM 10.3  --   --  9.6  --  9.9  MG  --  2.3  --  2.3  --  2.2  PHOS  --  3.1  --  2.7  --  2.3*   GFR: Estimated Creatinine Clearance: 59.4 mL/min (by C-G formula based on SCr of 0.79 mg/dL). Recent Labs  Lab 06/30/22 1406 06/30/22 1937 06/30/22 2015 07/01/22 0420 07/02/22 0834   PROCALCITON  --   --  <0.10  --   --   WBC 7.2  --   --  9.3 8.0  LATICACIDVEN  --  2.1*  --   --   --     Liver Function Tests: Recent Labs  Lab 06/30/22 1406 07/01/22 0420 07/02/22 0834  AST 113* 72* 55*  ALT 74* 45* 42  ALKPHOS 89 67 67  BILITOT 0.5 1.4* 1.4*  PROT 8.3* 6.8 6.7  ALBUMIN 2.5* 2.0* 2.1*   Recent Labs  Lab 06/30/22 1406  LIPASE 24   Recent Labs  Lab 06/30/22 1952  AMMONIA 15    ABG    Component Value Date/Time   PHART 7.48 (H) 07/02/2022 1135   PCO2ART 45 07/02/2022 1135   PO2ART 116 (H) 07/02/2022 1135  HCO3 33.5 (H) 07/02/2022 1135   TCO2 34 (H) 07/01/2022 0830   O2SAT 100 07/02/2022 1135     Coagulation Profile: Recent Labs  Lab 07/01/22 0505  INR 1.0    Cardiac Enzymes: Recent Labs  Lab 06/30/22 2015  CKTOTAL 21*    HbA1C: Hgb A1c MFr Bld  Date/Time Value Ref Range Status  06/19/2022 06:57 AM 6.9 (H) 4.8 - 5.6 % Final    Comment:    (NOTE) Pre diabetes:          5.7%-6.4%  Diabetes:              >6.4%  Glycemic control for   <7.0% adults with diabetes     CBG: Recent Labs  Lab 07/01/22 1132 07/01/22 1705 07/01/22 2358 07/02/22 0751 07/02/22 1205  GLUCAP 124* 154* 128* 129* 125*    Review of Systems:   Unable to obtain as pt is encephalopathic.  Past Medical History:  She,  has a past medical history of Benign essential HTN (10/31/2015), Cardiac tamponade due to viral pericarditis, LBBB (left bundle branch block), Lung mass, Postoperative atrial fibrillation ([place]), and Pulmonary nodule.   Surgical History:   Past Surgical History:  Procedure Laterality Date   ABDOMINAL HYSTERECTOMY  1988   chest tube placement  09/03/11   LUNG REMOVAL, PARTIAL     right knee arthoscopy with meniscal repair     subxiphoid pericardial window and drainage anterior partial pericardiectomy  09/03/11     Social History:   reports that she quit smoking about 43 years ago. Her smoking use included cigarettes. She has a 15.00  pack-year smoking history. She has never used smokeless tobacco. She reports that she does not drink alcohol and does not use drugs.   Family History:  Her family history includes Hypertension in her father, mother, and sister; Leukemia in her maternal aunt; Ovarian cancer in her mother; Prostate cancer in her brother; Stroke in her father; Thyroid disease in her sister.   Allergies Allergies  Allergen Reactions   Latex Rash   Penicillins Rash and Itching   Tape Hives and Rash   Z-Pak [Azithromycin] Rash and Other (See Comments)    Mouth sores   Eggs Or Egg-Derived Products Nausea And Vomiting   Amoxil [Amoxicillin] Rash   Sulfa Antibiotics Rash     Home Medications  Prior to Admission medications   Medication Sig Start Date End Date Taking? Authorizing Provider  acetaminophen (TYLENOL) 325 MG tablet Take 2 tablets (650 mg total) by mouth every 6 (six) hours as needed for mild pain (or Fever >/= 101). 06/26/22  Yes [name], Henreitta Leber, MD  albuterol (PROVENTIL) (2.5 MG/3ML) 0.083% nebulizer solution Take 3 mLs (2.5 mg total) by nebulization every 2 (two) hours as needed for wheezing or shortness of breath. 06/26/22  Yes [name], Henreitta Leber, MD  bisacodyl (DULCOLAX) 10 MG suppository Place 1 suppository (10 mg total) rectally daily as needed for moderate constipation. 06/26/22  Yes [name], Henreitta Leber, MD  diclofenac Sodium (VOLTAREN) 1 % GEL Apply 2 g topically 4 (four) times daily. Patient taking differently: Apply 2 g topically 4 (four) times daily. 0900, 1300, 1700, 2100 06/26/22  Yes [name], Henreitta Leber, MD  feeding supplement (ENSURE ENLIVE / ENSURE PLUS) LIQD Take 237 mLs by mouth 3 (three) times daily between meals. Patient taking differently: Take 237 mLs by mouth 3 (three) times daily between meals. 1000, 1400, 2200 06/26/22  Yes [name], Henreitta Leber, MD  fluticasone (FLONASE) 50 MCG/ACT nasal  spray Place 2 sprays into both nostrils daily. Patient taking differently: Place 2 sprays into both  nostrils every morning. 06/26/22  Yes [name], Henreitta Leber, MD  furosemide (LASIX) 40 MG tablet Take 1 tablet (40 mg total) by mouth daily. Patient taking differently: Take 40 mg by mouth every morning. 06/26/22 06/26/23 Yes [name], Henreitta Leber, MD  insulin aspart (NOVOLOG) 100 UNIT/ML injection 0-9 Units, Subcutaneous, 3 times daily with meals,  CBG < 70: Implement Hypoglycemia measures  CBG 70 - 120: 0 units  CBG 121 - 150: 1 unit  CBG 151 - 200: 2 units  CBG 201 - 250: 3 units  CBG 251 - 300: 5 units  CBG 301 - 350: 7 units  CBG 351 - 400: 9 units  CBG > 400: call MD 06/26/22  Yes [name], Henreitta Leber, MD  magic mouthwash SOLN Take 15 mLs by mouth 4 (four) times daily as needed for mouth pain. Patient taking differently: Take 15 mLs by mouth 4 (four) times daily as needed for mouth pain. 0900, 1300, 1700, 2100 06/26/22  Yes [name], Henreitta Leber, MD  magnesium hydroxide (MILK OF MAGNESIA) 400 MG/5ML suspension Take 15 mLs by mouth daily as needed for mild constipation. 06/26/22  Yes [name], Henreitta Leber, MD  metFORMIN (GLUCOPHAGE-XR) 500 MG 24 hr tablet Take 500 mg by mouth daily with supper.   Yes [provider]  metoprolol succinate (TOPROL-XL) 25 MG 24 hr tablet Take 0.5 tablets (12.5 mg total) by mouth daily. 06/26/22  Yes [name], Henreitta Leber, MD  Multiple Vitamin (MULTIVITAMIN WITH MINERALS) TABS tablet Take 1 tablet by mouth daily. Patient taking differently: Take 1 tablet by mouth every morning. 06/26/22  Yes [name], Henreitta Leber, MD  ondansetron (ZOFRAN) 4 MG tablet Take 4 mg by mouth every 6 (six) hours as needed for nausea or vomiting.   Yes [provider]  OXYGEN Inhale 2 L into the lungs daily.   Yes [provider]  polyethylene glycol (MIRALAX / GLYCOLAX) 17 g packet Take 17 g by mouth daily. Patient taking differently: Take 17 g by mouth every morning. 06/27/22  Yes [name], Henreitta Leber, MD  potassium chloride SA (KLOR-CON M) 20 MEQ tablet Take 1 tablet (20 mEq total) by mouth  daily. Patient taking differently: Take 20 mEq by mouth every morning. 06/26/22  Yes [name], Henreitta Leber, MD  Propylene Glycol (SYSTANE COMPLETE) 0.6 % SOLN Place 2 drops into both eyes 3 (three) times daily. 0900, 1300, 1700   Yes [provider]  senna-docusate (SENOKOT-S) 8.6-50 MG tablet Take 1 tablet by mouth at bedtime. 06/27/22  Yes [name], Henreitta Leber, MD  triamcinolone ointment (KENALOG) 0.5 % Apply 1 Application topically daily as needed (itching). Apply topically to chest every day as needed for itching.   Yes [provider]  Potassium Chloride 10 % SOLN Take 15 mLs by mouth every morning. Mix 15 mL (20 MEQ) with juice and drink by mouth once daily for supplement.    [provider]      Montey Hora, [place] For pager details, please see AMION or use Epic chat  After 1900, please call [facility] for cross coverage needs 07/02/2022, 12:37 PM

## 2022-07-02 NOTE — Progress Notes (Signed)
  Transition of Care [facility]) Screening Note   Patient Details  Name: KORTNE ALL Date of Birth: October 31, 1940   Transition of Care [facility]) CM/SW Contact:    Benard Halsted, LCSW Phone Number: 07/02/2022, 8:53 AM    Transition of Care Department [facility]) has reviewed patient with recent discharge from the hospital to rehab at [facility]. We will continue to monitor patient advancement through interdisciplinary progression rounds. If new patient transition needs arise, please place a TOC consult.

## 2022-07-02 NOTE — Plan of Care (Signed)
  Problem: Education: Goal: Ability to describe self-care measures that may prevent or decrease complications (Diabetes Survival Skills Education) will improve Outcome: Progressing Goal: Individualized Educational Video(s) Outcome: Progressing   Problem: Coping: Goal: Ability to adjust to condition or change in health will improve Outcome: Progressing   Problem: Fluid Volume: Goal: Ability to maintain a balanced intake and output will improve Outcome: Progressing   Problem: Health Behavior/Discharge Planning: Goal: Ability to identify and utilize available resources and services will improve Outcome: Progressing Goal: Ability to manage health-related needs will improve Outcome: Progressing   Problem: Health Behavior/Discharge Planning: Goal: Ability to manage health-related needs will improve Outcome: Progressing   Problem: Health Behavior/Discharge Planning: Goal: Ability to manage health-related needs will improve Outcome: Progressing   Problem: Metabolic: Goal: Ability to maintain appropriate glucose levels will improve Outcome: Progressing   Problem: Nutritional: Goal: Maintenance of adequate nutrition will improve Outcome: Progressing Goal: Progress toward achieving an optimal weight will improve Outcome: Progressing   Problem: Skin Integrity: Goal: Risk for impaired skin integrity will decrease Outcome: Progressing   Problem: Tissue Perfusion: Goal: Adequacy of tissue perfusion will improve Outcome: Progressing   Problem: Education: Goal: Knowledge of General Education information will improve Description: Including pain rating scale, medication(s)/side effects and non-pharmacologic comfort measures Outcome: Progressing   Problem: Health Behavior/Discharge Planning: Goal: Ability to manage health-related needs will improve Outcome: Progressing   Problem: Clinical Measurements: Goal: Ability to maintain clinical measurements within normal limits will  improve Outcome: Progressing Goal: Will remain free from infection Outcome: Progressing Goal: Diagnostic test results will improve Outcome: Progressing Goal: Respiratory complications will improve Outcome: Progressing Goal: Cardiovascular complication will be avoided Outcome: Progressing   Problem: Activity: Goal: Risk for activity intolerance will decrease Outcome: Progressing   Problem: Nutrition: Goal: Adequate nutrition will be maintained Outcome: Progressing   Problem: Coping: Goal: Level of anxiety will decrease Outcome: Progressing   Problem: Elimination: Goal: Will not experience complications related to bowel motility Outcome: Progressing Goal: Will not experience complications related to urinary retention Outcome: Progressing   Problem: Pain Managment: Goal: General experience of comfort will improve Outcome: Progressing   Problem: Safety: Goal: Ability to remain free from injury will improve Outcome: Progressing   Problem: Skin Integrity: Goal: Risk for impaired skin integrity will decrease Outcome: Progressing

## 2022-07-02 NOTE — Consult Note (Addendum)
Neurology Consultation  CC: encephalopathy  History is obtained from: family  HPI: Barbara Nelson is a 82 y.o. female  with medical history significant for type 2 diabetes mellitus, essential hypertension, chronic diastolic heart failure, chronic hypoxic respiratory failure on 2 L continuous nasal cannula, Who is admitted to [facility] from SNF on 06/30/2022 with acute metabolic encephalopathy.    The patient was recently hospitalized at [facility] from 06/18/2022 to 06/27/2022 with community-acquired pneumonia as well as acute on chronic diastolic heart failure exacerbation. Her pneumonia was treated with Levaquin, and she was subsequently discharged to home on 06/27/2022.   Per family, patient had shingles around December 17 and received antibiotics and then was started on gabapentin about 3 weeks later.  Her shingles was located around her eyelids, shoulder, and back region.  Patient also had a fall on December 26.  After being started on gabapentin, the patient was suddenly lethargic.  Patient was then switched to Lyrica and was taking this until she was hospitalized on 06/18/2022.  Family reports that since the patient started gabapentin, her mental status has been declining.  During the hospitalization from 06/18/2022 to 06/27/2022, patient's mentation did not improve and stayed fairly consistently with waxing and waning alertness throughout the days.  Patient was also on Phenergan during her hospitalization.  When she was discharged, Phenergan was discontinued due to concern of oversedation.  Her mental status drastically started declining while she was at the SNF, becoming more lethargic, not able to swallow, and vomiting.  She was unable to eat during that time.  She has not made a bowel movement since she has been readmitted to the hospital. Due to patient's rapidly worsening mentation, vomiting, and poor intake, she was brought to the hospital.   Patient has remained afebrile without  leukocytosis. Respiratory panel and blood culture have been negative. MRSA negative.   ROS: A complete ROS was performed and is negative except as noted in the HPI.   Past Medical History:  Diagnosis Date   Benign essential HTN 10/31/2015   Cardiac tamponade due to viral pericarditis    LBBB (left bundle branch block)    Lung mass    fungal infection s/p resection at [facility]   Postoperative atrial fibrillation (HCC)    Pulmonary nodule      Family History  Problem Relation Age of Onset   Hypertension Father    Stroke Father    Hypertension Mother    Ovarian cancer Mother    Prostate cancer Brother    Thyroid disease Sister    Hypertension Sister    Leukemia Maternal Aunt      Social History:  reports that she quit smoking about 43 years ago. Her smoking use included cigarettes. She has a 15.00 pack-year smoking history. She has never used smokeless tobacco. She reports that she does not drink alcohol and does not use drugs.   Prior to Admission medications   Medication Sig Start Date End Date Taking? Authorizing Provider  acetaminophen (TYLENOL) 325 MG tablet Take 2 tablets (650 mg total) by mouth every 6 (six) hours as needed for mild pain (or Fever >/= 101). 06/26/22  Yes [name], Henreitta Leber, MD  albuterol (PROVENTIL) (2.5 MG/3ML) 0.083% nebulizer solution Take 3 mLs (2.5 mg total) by nebulization every 2 (two) hours as needed for wheezing or shortness of breath. 06/26/22  Yes [name], Henreitta Leber, MD  bisacodyl (DULCOLAX) 10 MG suppository Place 1 suppository (10 mg total) rectally daily as needed for  moderate constipation. 06/26/22  Yes [name], Henreitta Leber, MD  diclofenac Sodium (VOLTAREN) 1 % GEL Apply 2 g topically 4 (four) times daily. Patient taking differently: Apply 2 g topically 4 (four) times daily. 0900, 1300, 1700, 2100 06/26/22  Yes [name], Henreitta Leber, MD  feeding supplement (ENSURE ENLIVE / ENSURE PLUS) LIQD Take 237 mLs by mouth 3 (three) times daily between meals. Patient  taking differently: Take 237 mLs by mouth 3 (three) times daily between meals. 1000, 1400, 2200 06/26/22  Yes [name], Henreitta Leber, MD  fluticasone (FLONASE) 50 MCG/ACT nasal spray Place 2 sprays into both nostrils daily. Patient taking differently: Place 2 sprays into both nostrils every morning. 06/26/22  Yes [name], Henreitta Leber, MD  furosemide (LASIX) 40 MG tablet Take 1 tablet (40 mg total) by mouth daily. Patient taking differently: Take 40 mg by mouth every morning. 06/26/22 06/26/23 Yes [name], Henreitta Leber, MD  insulin aspart (NOVOLOG) 100 UNIT/ML injection 0-9 Units, Subcutaneous, 3 times daily with meals,  CBG < 70: Implement Hypoglycemia measures  CBG 70 - 120: 0 units  CBG 121 - 150: 1 unit  CBG 151 - 200: 2 units  CBG 201 - 250: 3 units  CBG 251 - 300: 5 units  CBG 301 - 350: 7 units  CBG 351 - 400: 9 units  CBG > 400: call MD 06/26/22  Yes [name], Henreitta Leber, MD  magic mouthwash SOLN Take 15 mLs by mouth 4 (four) times daily as needed for mouth pain. Patient taking differently: Take 15 mLs by mouth 4 (four) times daily as needed for mouth pain. 0900, 1300, 1700, 2100 06/26/22  Yes [name], Henreitta Leber, MD  magnesium hydroxide (MILK OF MAGNESIA) 400 MG/5ML suspension Take 15 mLs by mouth daily as needed for mild constipation. 06/26/22  Yes [name], Henreitta Leber, MD  metFORMIN (GLUCOPHAGE-XR) 500 MG 24 hr tablet Take 500 mg by mouth daily with supper.   Yes [provider]  metoprolol succinate (TOPROL-XL) 25 MG 24 hr tablet Take 0.5 tablets (12.5 mg total) by mouth daily. 06/26/22  Yes [name], Henreitta Leber, MD  Multiple Vitamin (MULTIVITAMIN WITH MINERALS) TABS tablet Take 1 tablet by mouth daily. Patient taking differently: Take 1 tablet by mouth every morning. 06/26/22  Yes [name], Henreitta Leber, MD  ondansetron (ZOFRAN) 4 MG tablet Take 4 mg by mouth every 6 (six) hours as needed for nausea or vomiting.   Yes [provider]  OXYGEN Inhale 2 L into the lungs daily.   Yes [provider]  polyethylene glycol (MIRALAX / GLYCOLAX) 17 g packet Take 17 g by mouth daily. Patient taking differently: Take 17 g by mouth every morning. 06/27/22  Yes [name], Henreitta Leber, MD  potassium chloride SA (KLOR-CON M) 20 MEQ tablet Take 1 tablet (20 mEq total) by mouth daily. Patient taking differently: Take 20 mEq by mouth every morning. 06/26/22  Yes [name], Henreitta Leber, MD  Propylene Glycol (SYSTANE COMPLETE) 0.6 % SOLN Place 2 drops into both eyes 3 (three) times daily. 0900, 1300, 1700   Yes [provider]  senna-docusate (SENOKOT-S) 8.6-50 MG tablet Take 1 tablet by mouth at bedtime. 06/27/22  Yes [name], Henreitta Leber, MD  triamcinolone ointment (KENALOG) 0.5 % Apply 1 Application topically daily as needed (itching). Apply topically to chest every day as needed for itching.   Yes [provider]  Potassium Chloride 10 % SOLN Take 15 mLs by mouth every morning. Mix 15 mL (20 MEQ) with juice and drink by  mouth once daily for supplement.    [provider]     Exam: Current vital signs: BP (!) 142/77 (BP Location: Left Wrist)   Pulse (!) 107   Temp 98.4 F (36.9 C) (Axillary)   Resp 15   Wt 88.1 kg   LMP  (LMP Unknown)   SpO2 92%   BMI 32.32 kg/m    Physical Exam  Constitutional: Appears well-developed and well-nourished.  Psych: Affect appropriate to situation Eyes: No scleral injection HENT: No OP obstrucion Head: Normocephalic.  Cardiovascular: Normal rate and regular rhythm.  Respiratory: Effort normal, non-labored breathing GI: Soft.  No distension. There is no tenderness.  Skin: WDI  Neuro: Mental Status: Patient is awake, alert to self but not to place or time. Patient is not able to give a clear and coherent history. Cranial Nerves: II: Visual Fields are full. Pupils are equal, round, and reactive to light.   III,IV, VI: EOMI without ptosis or diplopia.  V: Facial sensation is symmetric to temperature VII: Facial movement is symmetric.   VIII: hearing is intact to voice Motor: Tone is normal. Bulk is normal. 5/5 strength was present in upper extremities. Limited strength evaluation on lower extremities due to pain from arthritis Sensory: Sensation is symmetric to light touch and temperature in the arms and legs.   I have reviewed labs in epic and the pertinent results are:   Results for orders placed or performed during the hospital encounter of 06/30/22 (from the past 48 hour(s))  Resp panel by RT-PCR (RSV, Flu A&B, Covid) Anterior Nasal Swab     Status: None   Collection Time: 06/30/22  1:31 PM   Specimen: Anterior Nasal Swab  Result Value Ref Range   SARS Coronavirus 2 by RT PCR NEGATIVE NEGATIVE   Influenza A by PCR NEGATIVE NEGATIVE   Influenza B by PCR NEGATIVE NEGATIVE    Comment: (NOTE) The Xpert Xpress SARS-CoV-2/FLU/RSV plus assay is intended as an aid in the diagnosis of influenza from Nasopharyngeal swab specimens and should not be used as a sole basis for treatment. Nasal washings and aspirates are unacceptable for Xpert Xpress SARS-CoV-2/FLU/RSV testing.  Fact Sheet for Patients: EntrepreneurPulse.com.au  Fact Sheet for Healthcare Providers: IncredibleEmployment.be  This test is not yet approved or cleared by the [place] FDA and has been authorized for detection and/or diagnosis of SARS-CoV-2 by FDA under an Emergency Use Authorization (EUA). This EUA will remain in effect (meaning this test can be used) for the duration of the COVID-19 declaration under Section 564(b)(1) of the Act, 21 U.S.C. section 360bbb-3(b)(1), unless the authorization is terminated or revoked.     Resp Syncytial Virus by PCR NEGATIVE NEGATIVE    Comment: (NOTE) Fact Sheet for Patients: EntrepreneurPulse.com.au  Fact Sheet for Healthcare Providers: IncredibleEmployment.be  This test is not yet approved or cleared by the [place] FDA  and has been authorized for detection and/or diagnosis of SARS-CoV-2 by FDA under an Emergency Use Authorization (EUA). This EUA will remain in effect (meaning this test can be used) for the duration of the COVID-19 declaration under Section 564(b)(1) of the Act, 21 U.S.C. section 360bbb-3(b)(1), unless the authorization is terminated or revoked.  Performed at [facility], [place] [address], [place], [place] [address]   CBC with Differential     Status: Abnormal   Collection Time: 06/30/22  2:06 PM  Result Value Ref Range   WBC 7.2 4.0 - 10.5 K/uL   RBC 5.35 (H) 3.87 - 5.11  MIL/uL   Hemoglobin 15.2 (H) 12.0 - 15.0 g/dL   HCT 46.8 (H) 36.0 - 46.0 %   MCV 87.5 80.0 - 100.0 fL   MCH 28.4 26.0 - 34.0 pg   MCHC 32.5 30.0 - 36.0 g/dL   RDW 14.9 11.5 - 15.5 %   Platelets 192 150 - 400 K/uL    Comment: REPEATED TO VERIFY   nRBC 0.0 0.0 - 0.2 %   Neutrophils Relative % 65 %   Neutro Abs 4.7 1.7 - 7.7 K/uL   Lymphocytes Relative 12 %   Lymphs Abs 0.8 0.7 - 4.0 K/uL   Monocytes Relative 19 %   Monocytes Absolute 1.3 (H) 0.1 - 1.0 K/uL   Eosinophils Relative 0 %   Eosinophils Absolute 0.0 0.0 - 0.5 K/uL   Basophils Relative 0 %   Basophils Absolute 0.0 0.0 - 0.1 K/uL   Immature Granulocytes 4 %   Abs Immature Granulocytes 0.26 (H) 0.00 - 0.07 K/uL    Comment: Performed at [facility], [address] [place], [place] [address]  Comprehensive metabolic panel     Status: Abnormal   Collection Time: 06/30/22  2:06 PM  Result Value Ref Range   Sodium 140 135 - 145 mmol/L   Potassium 4.5 3.5 - 5.1 mmol/L   Chloride 98 98 - 111 mmol/L   CO2 31 22 - 32 mmol/L   Glucose, Bld 164 (H) 70 - 99 mg/dL    Comment: Glucose reference range applies only to samples taken after fasting for at least 8 hours.   BUN 20 8 - 23 mg/dL   Creatinine, Ser 0.97 0.44 - 1.00 mg/dL   Calcium 10.3 8.9 - 10.3 mg/dL   Total Protein 8.3 (H) 6.5 - 8.1 g/dL   Albumin 2.5 (L) 3.5 - 5.0 g/dL   AST 113 (H)  15 - 41 U/L   ALT 74 (H) 0 - 44 U/L   Alkaline Phosphatase 89 38 - 126 U/L   Total Bilirubin 0.5 0.3 - 1.2 mg/dL   GFR, Estimated 58 (L) >60 mL/min    Comment: (NOTE) Calculated using the CKD-EPI Creatinine Equation (2021)    Anion gap 11 5 - 15    Comment: Performed at [facility] [address], [place], [place] [address]  Lipase, blood     Status: None   Collection Time: 06/30/22  2:06 PM  Result Value Ref Range   Lipase 24 11 - 51 U/L    Comment: Performed at [facility], [place] [address], [place], [place] [address]  Brain natriuretic peptide     Status: None   Collection Time: 06/30/22  2:06 PM  Result Value Ref Range   B Natriuretic Peptide 28.2 0.0 - 100.0 pg/mL    Comment: Performed at [facility] [address] [place], [place] [address]  Lactic acid, plasma     Status: Abnormal   Collection Time: 06/30/22  7:37 PM  Result Value Ref Range   Lactic Acid, Venous 2.1 (HH) 0.5 - 1.9 mmol/L    Comment: CRITICAL RESULT CALLED TO, READ BACK BY AND VERIFIED WITH A.JAMES,RN. 2107 06/30/22. LPAIT Performed at [facility], [place] [address] [place], [place] [address] CORRECTED ON 02/14 AT 0246: PREVIOUSLY REPORTED AS 2.1 CRITICAL RESULT CALLED TO, READ BACK BY AND VERIFIED WITH A.JAMES,RN. 06/30/22. LPAIT   Magnesium     Status: None   Collection Time: 06/30/22  7:38 PM  Result Value  Ref Range   Magnesium 2.3 1.7 - 2.4 mg/dL    Comment: Performed at [facility] [address] [place], [place] [address]  Phosphorus     Status: None   Collection Time: 06/30/22  7:38 PM  Result Value Ref Range   Phosphorus 3.1 2.5 - 4.6 mg/dL    Comment: Performed at [facility], [place] [address], [place], [place] [address]  Culture, blood (routine x 2)     Status: None (Preliminary result)   Collection Time: 06/30/22  7:52 PM   Specimen: BLOOD  Result Value Ref Range   Specimen Description BLOOD LEFT ANTECUBITAL    Special Requests      BOTTLES DRAWN  AEROBIC AND ANAEROBIC Blood Culture adequate volume   Culture      NO GROWTH 2 DAYS Performed at [facility], [place] [address], [place], [place] [address]    Report Status PENDING   Ammonia     Status: None   Collection Time: 06/30/22  7:52 PM  Result Value Ref Range   Ammonia 15 9 - 35 umol/L    Comment: Performed at [facility], [place] [address], [place], [place] [address]  TSH     Status: None   Collection Time: 06/30/22  7:52 PM  Result Value Ref Range   TSH 1.727 0.350 - 4.500 uIU/mL    Comment: Performed by a 3rd Generation assay with a functional sensitivity of <=0.01 uIU/mL. Performed at [facility], [place] [address] [place], [place] [address]   I-Stat venous blood gas, ED     Status: Abnormal   Collection Time: 06/30/22  8:00 PM  Result Value Ref Range   pH, Ven 7.469 (H) 7.25 - 7.43   pCO2, Ven 48.0 44 - 60 mmHg   pO2, Ven 23 (LL) 32 - 45 mmHg   Bicarbonate 34.8 (H) 20.0 - 28.0 mmol/L   TCO2 36 (H) 22 - 32 mmol/L   O2 Saturation 42 %   Acid-Base Excess 10.0 (H) 0.0 - 2.0 mmol/L   Sodium 140 135 - 145 mmol/L   Potassium 4.3 3.5 - 5.1 mmol/L   Calcium, Ion 1.27 1.15 - 1.40 mmol/L   HCT 44.0 36.0 - 46.0 %   Hemoglobin 15.0 12.0 - 15.0 g/dL   Sample type VENOUS    Comment NOTIFIED PHYSICIAN   Procalcitonin - Baseline     Status: None   Collection Time: 06/30/22  8:15 PM  Result Value Ref Range   Procalcitonin <0.10 ng/mL    Comment:        Interpretation: PCT (Procalcitonin) <= 0.5 ng/mL: Systemic infection (sepsis) is not likely. Local bacterial infection is possible. (NOTE)       Sepsis PCT Algorithm           Lower Respiratory Tract                                      Infection PCT Algorithm    ----------------------------     ----------------------------         PCT < 0.25 ng/mL                PCT < 0.10 ng/mL          Strongly encourage             Strongly discourage   discontinuation of antibiotics  initiation of antibiotics     ----------------------------     -----------------------------       PCT 0.25 - 0.50 ng/mL            PCT 0.10 - 0.25 ng/mL               OR       >80% decrease in PCT            Discourage initiation of                                            antibiotics      Encourage discontinuation           of antibiotics    ----------------------------     -----------------------------         PCT >= 0.50 ng/mL              PCT 0.26 - 0.50 ng/mL               AND        <80% decrease in PCT             Encourage initiation of                                             antibiotics       Encourage continuation           of antibiotics    ----------------------------     -----------------------------        PCT >= 0.50 ng/mL                  PCT > 0.50 ng/mL               AND         increase in PCT                  Strongly encourage                                      initiation of antibiotics    Strongly encourage escalation           of antibiotics                                     -----------------------------                                           PCT <= 0.25 ng/mL                                                 OR                                        >  80% decrease in PCT                                      Discontinue / Do not initiate                                             antibiotics  Performed at [facility], [place] [address] [place], [place] [address]   CK     Status: Abnormal   Collection Time: 06/30/22  8:15 PM  Result Value Ref Range   Total CK 21 (L) 38 - 234 U/L    Comment: Performed at [facility], [place] [address] [place], [place] [address]  Culture, blood (routine x 2)     Status: None (Preliminary result)   Collection Time: 06/30/22  8:28 PM   Specimen: BLOOD LEFT HAND  Result Value Ref Range   Specimen Description BLOOD LEFT HAND    Special Requests      BOTTLES DRAWN AEROBIC AND ANAEROBIC Blood Culture adequate volume   Culture       NO GROWTH 2 DAYS Performed at [facility], [place] [address], [place], [place] [address]    Report Status PENDING   Acetaminophen level     Status: Abnormal   Collection Time: 06/30/22  9:05 PM  Result Value Ref Range   Acetaminophen (Tylenol), Serum <10 (L) 10 - 30 ug/mL    Comment: (NOTE) Therapeutic concentrations vary significantly. A range of 10-30 ug/mL  may be an effective concentration for many patients. However, some  are best treated at concentrations outside of this range. Acetaminophen concentrations >150 ug/mL at 4 hours after ingestion  and >50 ug/mL at 12 hours after ingestion are often associated with  toxic reactions.  Performed at [facility], [place] [address] [place], [place] [address]   CBG monitoring, ED     Status: Abnormal   Collection Time: 06/30/22 11:11 PM  Result Value Ref Range   Glucose-Capillary 146 (H) 70 - 99 mg/dL    Comment: Glucose reference range applies only to samples taken after fasting for at least 8 hours.  MRSA Next Gen by PCR, Nasal     Status: None   Collection Time: 06/30/22 11:21 PM  Result Value Ref Range   MRSA by PCR Next Gen NOT DETECTED NOT DETECTED    Comment: (NOTE) The GeneXpert MRSA Assay (FDA approved for NASAL specimens only), is one component of a comprehensive MRSA colonization surveillance program. It is not intended to diagnose MRSA infection nor to guide or monitor treatment for MRSA infections. Test performance is not FDA approved in patients less than 41 years old. Performed at [facility], [place] [address] [place], [place] [address]   CBC with Differential/Platelet     Status: Abnormal   Collection Time: 07/01/22  4:20 AM  Result Value Ref Range   WBC 9.3 4.0 - 10.5 K/uL   RBC 4.65 3.87 - 5.11 MIL/uL   Hemoglobin 13.1 12.0 - 15.0 g/dL   HCT 40.5 36.0 - 46.0 %   MCV 87.1 80.0 - 100.0 fL   MCH 28.2 26.0 - 34.0 pg   MCHC 32.3 30.0 - 36.0 g/dL   RDW 15.0 11.5 - 15.5 %   Platelets 223  150 - 400  K/uL   nRBC 0.0 0.0 - 0.2 %   Neutrophils Relative % 66 %   Neutro Abs 6.1 1.7 - 7.7 K/uL   Lymphocytes Relative 11 %   Lymphs Abs 1.0 0.7 - 4.0 K/uL   Monocytes Relative 20 %   Monocytes Absolute 1.9 (H) 0.1 - 1.0 K/uL   Eosinophils Relative 0 %   Eosinophils Absolute 0.0 0.0 - 0.5 K/uL   Basophils Relative 0 %   Basophils Absolute 0.0 0.0 - 0.1 K/uL   Immature Granulocytes 3 %   Abs Immature Granulocytes 0.27 (H) 0.00 - 0.07 K/uL    Comment: Performed at [facility] [address] [place], [place] [address]  Comprehensive metabolic panel     Status: Abnormal   Collection Time: 07/01/22  4:20 AM  Result Value Ref Range   Sodium 138 135 - 145 mmol/L   Potassium 4.0 3.5 - 5.1 mmol/L    Comment: HEMOLYSIS AT THIS LEVEL MAY AFFECT RESULT   Chloride 101 98 - 111 mmol/L   CO2 29 22 - 32 mmol/L   Glucose, Bld 136 (H) 70 - 99 mg/dL    Comment: Glucose reference range applies only to samples taken after fasting for at least 8 hours.   BUN 19 8 - 23 mg/dL   Creatinine, Ser 0.81 0.44 - 1.00 mg/dL   Calcium 9.6 8.9 - 10.3 mg/dL   Total Protein 6.8 6.5 - 8.1 g/dL   Albumin 2.0 (L) 3.5 - 5.0 g/dL   AST 72 (H) 15 - 41 U/L    Comment: HEMOLYSIS AT THIS LEVEL MAY AFFECT RESULT   ALT 45 (H) 0 - 44 U/L    Comment: HEMOLYSIS AT THIS LEVEL MAY AFFECT RESULT   Alkaline Phosphatase 67 38 - 126 U/L   Total Bilirubin 1.4 (H) 0.3 - 1.2 mg/dL    Comment: HEMOLYSIS AT THIS LEVEL MAY AFFECT RESULT   GFR, Estimated >60 >60 mL/min    Comment: (NOTE) Calculated using the CKD-EPI Creatinine Equation (2021)    Anion gap 8 5 - 15    Comment: Performed at [facility], [place] [address], [place], [place] [address]  Magnesium     Status: None   Collection Time: 07/01/22  4:20 AM  Result Value Ref Range   Magnesium 2.3 1.7 - 2.4 mg/dL    Comment: Performed at [facility] [address] [place], [place] [address]  Phosphorus     Status: None   Collection Time: 07/01/22  4:20 AM   Result Value Ref Range   Phosphorus 2.7 2.5 - 4.6 mg/dL    Comment: Performed at [facility] [address] [place], [place] [address]  Protime-INR     Status: None   Collection Time: 07/01/22  5:05 AM  Result Value Ref Range   Prothrombin Time 13.2 11.4 - 15.2 seconds   INR 1.0 0.8 - 1.2    Comment: (NOTE) INR goal varies based on device and disease states. Performed at [facility], [place] [address] [place],  [address]   Urinalysis, Routine w reflex microscopic -Urine, Clean Catch     Status: Abnormal   Collection Time: 07/01/22  6:10 AM  Result Value Ref Range   Color, Urine YELLOW YELLOW   APPearance CLEAR CLEAR   Specific Gravity, Urine 1.038 (H) 1.005 - 1.030   pH 5.0 5.0 - 8.0   Glucose, UA NEGATIVE NEGATIVE mg/dL  Hgb urine dipstick NEGATIVE NEGATIVE   Bilirubin Urine NEGATIVE NEGATIVE   Ketones, ur 5 (A) NEGATIVE mg/dL   Protein, ur >=300 (A) NEGATIVE mg/dL   Nitrite NEGATIVE NEGATIVE   Leukocytes,Ua NEGATIVE NEGATIVE   RBC / HPF 0-5 0 - 5 RBC/hpf   WBC, UA 0-5 0 - 5 WBC/hpf   Bacteria, UA NONE SEEN NONE SEEN   Squamous Epithelial / HPF 0-5 0 - 5 /HPF   Mucus PRESENT     Comment: Performed at [facility], [place] [address], [place], [place] [address]  Rapid urine drug screen (hospital performed)     Status: None   Collection Time: 07/01/22  6:10 AM  Result Value Ref Range   Opiates NONE DETECTED NONE DETECTED   Cocaine NONE DETECTED NONE DETECTED   Benzodiazepines NONE DETECTED NONE DETECTED   Amphetamines NONE DETECTED NONE DETECTED   Tetrahydrocannabinol NONE DETECTED NONE DETECTED   Barbiturates NONE DETECTED NONE DETECTED    Comment: (NOTE) DRUG SCREEN FOR MEDICAL PURPOSES ONLY.  IF CONFIRMATION IS NEEDED FOR ANY PURPOSE, NOTIFY LAB WITHIN 5 DAYS.  LOWEST DETECTABLE LIMITS FOR URINE DRUG SCREEN Drug Class                     Cutoff (ng/mL) Amphetamine and metabolites    1000 Barbiturate and metabolites    200 Benzodiazepine                  200 Opiates and metabolites        300 Cocaine and metabolites        300 THC                            50 Performed at [facility], [place] [address] [place], [place] [address]   CBG monitoring, ED     Status: Abnormal   Collection Time: 07/01/22  7:51 AM  Result Value Ref Range   Glucose-Capillary 133 (H) 70 - 99 mg/dL    Comment: Glucose reference range applies only to samples taken after fasting for at least 8 hours.   Comment 1 Notify RN    Comment 2 Document in Chart   I-Stat venous blood gas, ED     Status: Abnormal   Collection Time: 07/01/22  8:30 AM  Result Value Ref Range   pH, Ven 7.440 (H) 7.25 - 7.43   pCO2, Ven 48.1 44 - 60 mmHg   pO2, Ven 42 32 - 45 mmHg   Bicarbonate 32.7 (H) 20.0 - 28.0 mmol/L   TCO2 34 (H) 22 - 32 mmol/L   O2 Saturation 79 %   Acid-Base Excess 7.0 (H) 0.0 - 2.0 mmol/L   Sodium 144 135 - 145 mmol/L   Potassium 3.7 3.5 - 5.1 mmol/L   Calcium, Ion 1.29 1.15 - 1.40 mmol/L   HCT 41.0 36.0 - 46.0 %   Hemoglobin 13.9 12.0 - 15.0 g/dL   Sample type VENOUS   CBG monitoring, ED     Status: Abnormal   Collection Time: 07/01/22 11:32 AM  Result Value Ref Range   Glucose-Capillary 124 (H) 70 - 99 mg/dL    Comment: Glucose reference range applies only to samples taken after fasting for at least 8 hours.  Glucose, capillary     Status: Abnormal   Collection Time: 07/01/22  5:05 PM  Result Value Ref Range   Glucose-Capillary 154 (H) 70 - 99 mg/dL  Comment: Glucose reference range applies only to samples taken after fasting for at least 8 hours.  Glucose, capillary     Status: Abnormal   Collection Time: 07/01/22 11:58 PM  Result Value Ref Range   Glucose-Capillary 128 (H) 70 - 99 mg/dL    Comment: Glucose reference range applies only to samples taken after fasting for at least 8 hours.  Glucose, capillary     Status: Abnormal   Collection Time: 07/02/22  7:51 AM  Result Value Ref Range   Glucose-Capillary 129 (H) 70 - 99  mg/dL    Comment: Glucose reference range applies only to samples taken after fasting for at least 8 hours.  CBC with Differential/Platelet     Status: Abnormal   Collection Time: 07/02/22  8:34 AM  Result Value Ref Range   WBC 8.0 4.0 - 10.5 K/uL   RBC 4.97 3.87 - 5.11 MIL/uL   Hemoglobin 13.6 12.0 - 15.0 g/dL   HCT 42.8 36.0 - 46.0 %   MCV 86.1 80.0 - 100.0 fL   MCH 27.4 26.0 - 34.0 pg   MCHC 31.8 30.0 - 36.0 g/dL   RDW 15.1 11.5 - 15.5 %   Platelets 172 150 - 400 K/uL   nRBC 0.0 0.0 - 0.2 %   Neutrophils Relative % 66 %   Neutro Abs 5.3 1.7 - 7.7 K/uL   Lymphocytes Relative 11 %   Lymphs Abs 0.9 0.7 - 4.0 K/uL   Monocytes Relative 21 %   Monocytes Absolute 1.7 (H) 0.1 - 1.0 K/uL   Eosinophils Relative 1 %   Eosinophils Absolute 0.1 0.0 - 0.5 K/uL   Basophils Relative 1 %   Basophils Absolute 0.1 0.0 - 0.1 K/uL   WBC Morphology MORPHOLOGY UNREMARKABLE    RBC Morphology MORPHOLOGY UNREMARKABLE    Smear Review MORPHOLOGY UNREMARKABLE    Abs Immature Granulocytes 0.00 0.00 - 0.07 K/uL    Comment: Performed at [facility], [address], [place], [place] [address]  Comprehensive metabolic panel     Status: Abnormal   Collection Time: 07/02/22  8:34 AM  Result Value Ref Range   Sodium 144 135 - 145 mmol/L   Potassium 3.4 (L) 3.5 - 5.1 mmol/L   Chloride 109 98 - 111 mmol/L   CO2 27 22 - 32 mmol/L   Glucose, Bld 129 (H) 70 - 99 mg/dL    Comment: Glucose reference range applies only to samples taken after fasting for at least 8 hours.   BUN 17 8 - 23 mg/dL   Creatinine, Ser 0.79 0.44 - 1.00 mg/dL   Calcium 9.9 8.9 - 10.3 mg/dL   Total Protein 6.7 6.5 - 8.1 g/dL   Albumin 2.1 (L) 3.5 - 5.0 g/dL   AST 55 (H) 15 - 41 U/L   ALT 42 0 - 44 U/L   Alkaline Phosphatase 67 38 - 126 U/L   Total Bilirubin 1.4 (H) 0.3 - 1.2 mg/dL   GFR, Estimated >60 >60 mL/min    Comment: (NOTE) Calculated using the CKD-EPI Creatinine Equation (2021)    Anion gap 8 5 - 15    Comment:  Performed at [facility], [place] [address], [place], [place] [address]  Magnesium     Status: None   Collection Time: 07/02/22  8:34 AM  Result Value Ref Range   Magnesium 2.2 1.7 - 2.4 mg/dL    Comment: Performed at [facility] [address] [place],  [address]  Phosphorus     Status: Abnormal   Collection Time: 07/02/22  8:34 AM  Result Value Ref Range   Phosphorus 2.3 (L) 2.5 - 4.6 mg/dL    Comment: Performed at [facility] [address], [place], [place] [address]  Vitamin B12     Status: Abnormal   Collection Time: 07/02/22  8:34 AM  Result Value Ref Range   Vitamin B-12 1,172 (H) 180 - 914 pg/mL    Comment: (NOTE) This assay is not validated for testing neonatal or myeloproliferative syndrome specimens for Vitamin B12 levels. Performed at [facility], [place] [address], [place], [place] [address]   Blood gas, arterial     Status: Abnormal   Collection Time: 07/02/22 11:35 AM  Result Value Ref Range   pH, Arterial 7.48 (H) 7.35 - 7.45   pCO2 arterial 45 32 - 48 mmHg   pO2, Arterial 116 (H) 83 - 108 mmHg   Bicarbonate 33.5 (H) 20.0 - 28.0 mmol/L   Acid-Base Excess 8.8 (H) 0.0 - 2.0 mmol/L   O2 Saturation 100 %   Patient temperature 36.8    Collection site RIGHT RADIAL    Drawn by [id number]    Allens test (pass/fail) PASS PASS    Comment: Performed at [facility] [address], [place], [place] [address]  Glucose, capillary     Status: Abnormal   Collection Time: 07/02/22 12:05 PM  Result Value Ref Range   Glucose-Capillary 125 (H) 70 - 99 mg/dL    Comment: Glucose reference range applies only to samples taken after fasting for at least 8 hours.    US Abdomen Limited RUQ (LIVER/GB)  Result Date: 07/01/2022 CLINICAL DATA:  Elevated liver function tests. EXAM: ULTRASOUND ABDOMEN LIMITED RIGHT UPPER QUADRANT COMPARISON:  None Available. FINDINGS: Gallbladder: No gallstones or wall thickening visualized. No sonographic Murphy sign  noted by sonographer. Common bile duct: Diameter: Normal at 2 mm Liver: No focal lesion identified. Within normal limits in parenchymal echogenicity. Portal vein is patent on color Doppler imaging with normal direction of blood flow towards the liver. Other: None. IMPRESSION: Normal RIGHT upper quadrant ultrasound Electronically Signed   By: Suzy Bouchard M.D.   On: 07/01/2022 11:55   MR BRAIN WO CONTRAST  Result Date: 06/30/2022 CLINICAL DATA:  Stroke suspected EXAM: MRI HEAD WITHOUT CONTRAST TECHNIQUE: Multiplanar, multiecho pulse sequences of the brain and surrounding structures were obtained without intravenous contrast. COMPARISON:  No prior MRI available, correlation is made with CT head 06/30/2022 FINDINGS: Evaluation is limited by motion, which required less motion sensitive sequences. In addition, the patient could not remain still to complete the exam. Brain: No restricted diffusion to suggest acute or subacute infarct. No acute hemorrhage, mass, mass effect, or midline shift. No hydrocephalus or extra-axial collection. Vascular: Grossly normal arterial flow voids. Skull and upper cervical spine: Grossly normal marrow signal. Sinuses/Orbits: Unable to evaluate. Other: None. IMPRESSION: Evaluation is limited by motion, as well as the inability of the patient to complete the exam. Within this limitation, no acute intracranial process. No evidence of acute or subacute infarct. Electronically Signed   By: Merilyn Baba M.D.   On: 06/30/2022 23:10   DG Chest Port 1 View  Result Date: 06/30/2022 CLINICAL DATA:  Cough and mental status change EXAM: PORTABLE CHEST 1 VIEW COMPARISON:  Chest x-ray 06/20/2022 FINDINGS: Heart is enlarged, unchanged. There is some minimal strandy and hazy opacities in both lung bases. There is no pneumothorax. There is  a small left pleural effusion. No acute fractures are seen. IMPRESSION: 1. Minimal strandy and hazy opacities in both lung bases may represent atelectasis or  infection. 2. Small left pleural effusion. 3. Stable cardiomegaly. Electronically Signed   By: Ronney Asters M.D.   On: 06/30/2022 20:30   CT Head Wo Contrast  Result Date: 06/30/2022 CLINICAL DATA:  Progressive lethargy EXAM: CT HEAD WITHOUT CONTRAST TECHNIQUE: Contiguous axial images were obtained from the base of the skull through the vertex without intravenous contrast. RADIATION DOSE REDUCTION: This exam was performed according to the departmental dose-optimization program which includes automated exposure control, adjustment of the mA and/or kV according to patient size and/or use of iterative reconstruction technique. COMPARISON:  CT brain 05/13/2022 FINDINGS: Brain: No acute territorial infarction, hemorrhage, or intracranial mass. Patchy white matter hypodensity consistent with chronic small vessel ischemic change. The ventricles are nonenlarged. Vascular: No hyperdense vessels.  No unexpected calcification. Skull: Normal. Negative for fracture or focal lesion. Sinuses/Orbits: No acute finding. Other: None IMPRESSION: 1. No CT evidence for acute intracranial abnormality. 2. Mild chronic small vessel ischemic changes of the white matter. Electronically Signed   By: Donavan Foil M.D.   On: 06/30/2022 18:32   CT ABDOMEN PELVIS W CONTRAST  Result Date: 06/30/2022 CLINICAL DATA:  Left lower quadrant abdominal pain. EXAM: CT ABDOMEN AND PELVIS WITH CONTRAST TECHNIQUE: Multidetector CT imaging of the abdomen and pelvis was performed using the standard protocol following bolus administration of intravenous contrast. RADIATION DOSE REDUCTION: This exam was performed according to the departmental dose-optimization program which includes automated exposure control, adjustment of the mA and/or kV according to patient size and/or use of iterative reconstruction technique. CONTRAST:  32mL OMNIPAQUE IOHEXOL 350 MG/ML SOLN COMPARISON:  None Available. FINDINGS: Lower chest: Small right pleural effusion is noted  with adjacent subsegmental atelectasis. Hepatobiliary: No focal liver abnormality is seen. No gallstones, gallbladder wall thickening, or biliary dilatation. Pancreas: Unremarkable. No pancreatic ductal dilatation or surrounding inflammatory changes. Spleen: Normal in size without focal abnormality. Adrenals/Urinary Tract: Adrenal glands are unremarkable. Kidneys are normal, without renal calculi, focal lesion, or hydronephrosis. Bladder is unremarkable. Stomach/Bowel: The stomach and appendix are unremarkable. There is no evidence of bowel obstruction or inflammation. A large amount of stool seen in the rectum concerning for impaction. Vascular/Lymphatic: Aortic atherosclerosis. No enlarged abdominal or pelvic lymph nodes. Reproductive: Status post hysterectomy. No adnexal masses. Other: No abdominal wall hernia or abnormality. No abdominopelvic ascites. Musculoskeletal: No acute or significant osseous findings. IMPRESSION: Small right pleural effusion is noted with adjacent subsegmental atelectasis. Large amount of stool seen in the rectum concerning for impaction. No other significant abnormality seen in the abdomen or pelvis. Aortic Atherosclerosis (ICD10-I70.0). Electronically Signed   By: Marijo Conception M.D.   On: 06/30/2022 18:31      Primary Diagnosis:  Encephalopathy   Coralyn Pear, MD PGY-1 Psychiatry  I have seen the patient and reviewed the above note.  She has developed progressive encephalopathy over the past couple of months.  It initially started following a presumptive diagnosis of varicella which the family states involve the eyelid, hand, back.  She was diagnosed with shingles which is a presumptive diagnosis and she was started on Valtrex.  It is noted that Valtrex made her sleepy.  She was also started on gabapentin and following starting gabapentin it was noted that she became much more confused and this has progressed.  She was changed from gabapentin to Lyrica but did not have  significant improvement  following this.  She then was admitted with hospital-acquired pneumonia in early February as well as heart failure exacerbation.  She was started on Levaquin and she did have some improvement in her mental status during that hospitalization, but then reworsened following discharge.  She had essentially no concerns for cognition prior to December.     Impression: 82 year old female with progressive encephalopathy following a presumptive diagnosis of varicella involving multiple areas on the left side of her body including her eyebrow.  The distribution of the rash that is described by family I think is unusual for varicella, but if it is varicella increase my concern that it could have been spreading via the central nervous system.  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  It is possible that simply because of multiple medical problems and inflammatory nature such as shingles followed by pneumonia followed by poor nutrition followed by multiple environmental changes have resulted in progressive hypoactive delirium, but I would favor this being a diagnosis of exclusion.  I think that seizure is relatively low but I would favor getting a routine EEG.  I think that lumbar puncture could be helpful, though this far out from the initial symptoms a negative test would be of unclear significance.  Her fecal impaction with vomiting could also be contributing to prolonged delirium, but though this is more typical in patients with dementia.  Recommendations: -Agree with starting thiamine supplement -LP due to possible varicella causing encephalitis -EEG -Bowel movement encouragement -Neurology will continue to follow  Roland Rack, MD [facility] [phone]  If 7pm- 7am, please page neurology on call as listed in [place].

## 2022-07-03 ENCOUNTER — Inpatient Hospital Stay (HOSPITAL_COMMUNITY): Payer: Medicare Other

## 2022-07-03 ENCOUNTER — Encounter: Payer: Medicare Other | Admitting: Registered"

## 2022-07-03 DIAGNOSIS — N19 Unspecified kidney failure: Secondary | ICD-10-CM | POA: Diagnosis not present

## 2022-07-03 DIAGNOSIS — I5032 Chronic diastolic (congestive) heart failure: Secondary | ICD-10-CM | POA: Diagnosis not present

## 2022-07-03 DIAGNOSIS — E876 Hypokalemia: Secondary | ICD-10-CM

## 2022-07-03 DIAGNOSIS — K5900 Constipation, unspecified: Secondary | ICD-10-CM | POA: Diagnosis not present

## 2022-07-03 DIAGNOSIS — R4182 Altered mental status, unspecified: Secondary | ICD-10-CM | POA: Diagnosis not present

## 2022-07-03 DIAGNOSIS — G9341 Metabolic encephalopathy: Secondary | ICD-10-CM | POA: Diagnosis not present

## 2022-07-03 DIAGNOSIS — E870 Hyperosmolality and hypernatremia: Secondary | ICD-10-CM

## 2022-07-03 LAB — MAGNESIUM: Magnesium: 2.3 mg/dL (ref 1.7–2.4)

## 2022-07-03 LAB — CBC WITH DIFFERENTIAL/PLATELET
Abs Immature Granulocytes: 0.21 10*3/uL — ABNORMAL HIGH (ref 0.00–0.07)
Basophils Absolute: 0 10*3/uL (ref 0.0–0.1)
Basophils Relative: 1 %
Eosinophils Absolute: 0 10*3/uL (ref 0.0–0.5)
Eosinophils Relative: 0 %
HCT: 41.3 % (ref 36.0–46.0)
Hemoglobin: 13.2 g/dL (ref 12.0–15.0)
Immature Granulocytes: 3 %
Lymphocytes Relative: 18 %
Lymphs Abs: 1.1 10*3/uL (ref 0.7–4.0)
MCH: 28 pg (ref 26.0–34.0)
MCHC: 32 g/dL (ref 30.0–36.0)
MCV: 87.7 fL (ref 80.0–100.0)
Monocytes Absolute: 1.3 10*3/uL — ABNORMAL HIGH (ref 0.1–1.0)
Monocytes Relative: 20 %
Neutro Abs: 3.7 10*3/uL (ref 1.7–7.7)
Neutrophils Relative %: 58 %
Platelets: 132 10*3/uL — ABNORMAL LOW (ref 150–400)
RBC: 4.71 MIL/uL (ref 3.87–5.11)
RDW: 15.3 % (ref 11.5–15.5)
WBC: 6.4 10*3/uL (ref 4.0–10.5)
nRBC: 0 % (ref 0.0–0.2)

## 2022-07-03 LAB — COMPREHENSIVE METABOLIC PANEL
ALT: 40 U/L (ref 0–44)
AST: 58 U/L — ABNORMAL HIGH (ref 15–41)
Albumin: 1.9 g/dL — ABNORMAL LOW (ref 3.5–5.0)
Alkaline Phosphatase: 64 U/L (ref 38–126)
Anion gap: 8 (ref 5–15)
BUN: 17 mg/dL (ref 8–23)
CO2: 29 mmol/L (ref 22–32)
Calcium: 9.8 mg/dL (ref 8.9–10.3)
Chloride: 111 mmol/L (ref 98–111)
Creatinine, Ser: 0.82 mg/dL (ref 0.44–1.00)
GFR, Estimated: >60 mL/min (ref >60)
Glucose, Bld: 114 mg/dL — ABNORMAL HIGH (ref 70–99)
Potassium: 3.6 mmol/L (ref 3.5–5.1)
Sodium: 148 mmol/L — ABNORMAL HIGH (ref 135–145)
Total Bilirubin: 1.1 mg/dL (ref 0.3–1.2)
Total Protein: 6 g/dL — ABNORMAL LOW (ref 6.5–8.1)

## 2022-07-03 LAB — GLUCOSE, CAPILLARY
Glucose-Capillary: 102 mg/dL — ABNORMAL HIGH (ref 70–99)
Glucose-Capillary: 99 mg/dL (ref 70–99)
Glucose-Capillary: 112 mg/dL — ABNORMAL HIGH (ref 70–99)
Glucose-Capillary: 125 mg/dL — ABNORMAL HIGH (ref 70–99)

## 2022-07-03 LAB — HIV ANTIBODY (ROUTINE TESTING W REFLEX): HIV Screen 4th Generation wRfx: NONREACTIVE

## 2022-07-03 LAB — PHOSPHORUS: Phosphorus: 2.5 mg/dL (ref 2.5–4.6)

## 2022-07-03 MED ORDER — SODIUM CHLORIDE 3 % IN NEBU
4.0000 mL | INHALATION_SOLUTION | Freq: Two times a day (BID) | RESPIRATORY_TRACT | Status: DC
  Administered 2022-07-03 – 2022-07-05 (×3): 4 mL via RESPIRATORY_TRACT
  Filled 2022-07-03 (×6): qty 4

## 2022-07-03 MED ORDER — POTASSIUM CL IN DEXTROSE 5% 20 MEQ/L IV SOLN
20.0000 meq | INTRAVENOUS | Status: DC
  Administered 2022-07-03 – 2022-07-05 (×3): 20 meq via INTRAVENOUS
  Filled 2022-07-03 (×4): qty 1000

## 2022-07-03 MED ORDER — QUETIAPINE FUMARATE 25 MG PO TABS
25.0000 mg | ORAL_TABLET | Freq: Every day | ORAL | Status: DC
  Filled 2022-07-03: qty 1

## 2022-07-03 NOTE — Progress Notes (Signed)
Attempted to place NGT x 3 (48fr and 49fr).  Patient thrashing head and yelling.  NGT curling in mouth.  [place] team not available.  Patient family stated "we don't want her to have any sedatives" when discussing medications to help patient relax for NGT placement.  Dr. Cyndia Skeeters notified.

## 2022-07-03 NOTE — Progress Notes (Signed)
EEG complete - results pending 

## 2022-07-03 NOTE — Progress Notes (Signed)
Subjective: No significant changes  Exam: Vitals:   07/03/22 1250 07/03/22 1600  BP:  123/76  Pulse:  87  Resp: 18 17  Temp:    SpO2:  97%   Gen: In bed, NAD Resp: non-labored breathing, no acute distress Abd: soft, nt  Neuro: MS: She is lethargic and keeps eyes closed throughout most of the interview, but she does awaken and tell me her name.  When I ask her why she is not talking much she says that she is tired.  Her speech is fluent without hesitancy or dysarthria, but there is not much of it. CN: Blinks to threat bilaterally, EOMI Motor: She squeezes hands and plantar flexes to command bilaterally. Sensory: Endorses symmetric sensation  Pertinent Labs: B12 1172 ABG 7.4 8/45/116 UDS negative UA is negative BUN 17 Ammonia 15 TSH 1.727   B1-pending  Impression: 82 year old female with progressive encephalopathy over the past few months.  Her current presentation I think could very well be consistent with hypoactive delirium in the setting of fecal impaction.  She has had multiple medical problems of aninflammatory nature such as shingles followed by pneumonia followed by poor nutrition with multiple environmental changes which all could have resulted in progressive hypoactive delirium. n I would favor treating her underlying medical issues, supplementing with nutrition, trying to reestablish a day night cycle with Seroquel.  If this does not improve, then could consider using a low-dose stimulant during the day.  Recommendations: 1) Seroquel 25 mg q. 8 PM 2) continue opening shades during day, minimizing stimulation at night 3) continue bowel regimen, encourage motility.   4) avoid deliriogenic medications 5) agree with thiamine supplementation 6) neurology will follow  Roland Rack, MD [facility] [phone]  If 7pm- 7am, please page neurology on call as listed in [place].

## 2022-07-03 NOTE — Progress Notes (Signed)
PROGRESS NOTE  TRACEY HERMANCE KGY:[id number] DOB: 10/05/1940   PCP: Cari Caraway, MD  Patient is from: SNF  DOA: 06/30/2022 LOS: 3  Chief complaints Chief Complaint  Patient presents with   Fatigue   Abdominal Pain   Emesis     Brief Narrative / Interim history: 82 year old F with PMH of diastolic CHF, chronic hypoxic RF on 2 L, HTN, and recent hospitalization from 2/1-10 with CAP and acute on chronic CHF returning with altered mental status, lethargy and vomiting, and admitted with working diagnosis of HCAP and encephalopathy.  Family denies history of dementia or cognitive impairment.  They significant decline in cognition since she was diagnosed with shingles in January and started on gabapentin.  She was weaned off gabapentin but she continued to decline, even worse after her recent hospitalization.  She is currently not on any sedating medication.  In ED, slightly hypertensive.  Not febrile.  Saturating in upper 90s to 100 on 2 L.  No leukocytosis.  Hgb 15.2 (12.4 about 12 days ago).  Slightly elevated LFT and Cr.  Lipase, BNP, ammonia, TSH, CK and procalcitonin within normal.  Lactic acid 2.1.  VBG suggess mild metabolic alkalosis.  UDS negative.  UA with > 300 protein.  XR with minimal strandy and hazy opacity at both lung bases suggesting atelectasis or infection, small left pleural effusion and stable cardiomegaly.  CT abdomen and pelvis with contrast showed large amount of stool in the rectum concerning for impaction.  She had disimpaction in ED.  CT head without acute finding.  MRI brain without acute finding but limited by motion.  Patient was started on IVF, IV cefepime and vancomycin and admitted with working diagnosis of HCAP, encephalopathy and dehydration.  The next day, pneumonia felt to be less likely.  Blood cultures negative.  Antibiotic discontinued.  Further encephalopathy workup including ABG, UDS and B12 unrevealing.  Neurology consulted and she had lumbar  puncture.  CSF studies unrevealing so far.  EEG suggest moderate diffuse encephalopathy but no seizure or epileptiform discharge.  Started on high-dose thiamine without significant improvement.  Neurology following.   Subjective: Seen and examined earlier this morning and this afternoon.  No major events overnight of this morning.  She remains encephalopathic.  Able to open eyes briefly but falls back asleep.  Barely follows commands.  Does not appear to be in distress.  Objective: Vitals:   07/03/22 0805 07/03/22 0817 07/03/22 1200 07/03/22 1250  BP:  125/68 97/62   Pulse: 88 93 87   Resp: 18 17 18 18   Temp:  97.8 F (36.6 C) 98 F (36.7 C)   TempSrc:  Oral Oral   SpO2:  95% 93%   Weight:        Examination: GENERAL: No apparent distress.  Nontoxic. HEENT: MMM.  Vision and hearing grossly intact.  NECK: Supple.  No apparent JVD.  RESP:  No IWOB.  Fair aeration bilaterally. CVS:  RRR. Heart sounds normal.  ABD/GI/GU: BS+. Abd soft, NTND.  MSK/EXT:   No apparent deformity. Moves extremities. No edema.  SKIN: no apparent skin lesion or wound NEURO: Sleepy.  Briefly wakes to voice and falls back asleep.  Barely follows command.  PERRL.  No facial asymmetry.  Patellar reflex metric.  No apparent focal neurodeficit but limited exam due to mental status. PSYCH: Sleepy.  No distress or agitation.  Procedures:  2/15-lumbar puncture.  Microbiology summarized: COVID-19, influenza and RSV PCR nonreactive MRSA PCR screen nonreactive Blood cultures NGTD CSF cultures  NGTD.  Assessment and plan: Principal Problem:   Acute metabolic encephalopathy Active Problems:   Acute hypoxic respiratory failure (HCC)   Essential hypertension   Hypercalcemia   Dehydration   Acute prerenal azotemia   Transaminitis   Constipation   DM2 (diabetes mellitus, type 2) (HCC)   Allergic rhinitis   Chronic diastolic CHF (congestive heart failure) (HCC)  Acute metabolic encephalopathy: Initially  felt to be dehydration and delirium but mental status worse despite hydration.  Encephalopathy workup including CT head, MRI brain, ammonia, TSH, VBG, UDS, B12, CSF study and EEG unrevealing.  Encephalopathy persisted, may be worse. -Neurology following. -Reorientation and delirium precaution -Avoid sedating medications -Continue high-dose thiamine -Follow thiamine level and VZV IgG -Change IV fluid to D5 for mild hyponatremia -Fall and aspiration precaution. -PT/OT/SLP/RD eval. -[facility] called for transfer but at capacity.  Dehydration: Likely due to poor intake and vomiting.  This seems to have resolved with IV hydration.  -Change IV fluid to D5 with KCl for hyponatremia and hypokalemia.  Vomiting: Likely due to fecal impaction.  Only had 1 episode of emesis since admission but has not been taking p.o. either.  She has mildly elevated LFT that seems to be improved.  CT abdomen and pelvis without acute finding other than fecal impaction.  RUQ [place] negative. -Continue bowel regimen -Antiemetics as needed -Continue IV fluid -GI consulted at family request  Constipation/fecal impaction -Try Dulcolax suppository and digital disimpaction -GI consulted at family request  Elevated LFT: Pattern consistent with EtOH and rhabdo but patient does not drink.  CK within normal.  LFT improved.  RUQ [place] negative.  AST improved. -Continue monitoring  Mild hyponatremia/hypokalemia -Changed fluid to D5 with KCl.  Chronic diastolic CHF: TTE on 2/2 with LVEF of 50 to 55% and G1-DD.  She was somewhat dehydrated on presentation.  She seems to be on Lasix.  No CHF on chest x-ray. -Continue holding Lasix -Strict intake and output, daily weights, renal functions and electrolytes  Chronic hypoxic respiratory failure: On 2 L at baseline.  Stable. -Minimum oxygen to keep saturation above 90%. -Try incentive spirometry and pulmonary toilet  Controlled IDDM-2 with hyperglycemia: A1c 6.9%. -Continue  current insulin regimen  Hypercalcemia: Likely secondary due to dehydration.  Resolved with hydration.  Generalized weakness/lethargy: Likely due to dehydration and encephalopathy -PT/OT eval as above  HCAP/pneumonia ruled out: No fever, leukocytosis.  Procalcitonin within normal.  COVID-19, influenza and RSV PCR nonreactive.  MRSA PCR negative.  Full RVP negative.  Blood cultures NGTD.  Cough likely due to silent aspiration in the setting of encephalopathy and vomiting. -Appreciate input by pulmonology. -Continue aspiration precaution and pulmonary toilet.  Obesity/decreased oral intake: Decreased oral intake likely due to encephalopathy.  I recommended starting tube feed with cortrack as soon as possible.  Family undecided about this.  Body mass index is 32.32 kg/m. Nutrition Problem: Inadequate oral intake Etiology: lethargy/confusion, dysphagia Signs/Symptoms: NPO status (pocketing/holding food) Interventions: Refer to RD note for recommendations, Ensure Enlive (each supplement provides 350kcal and 20 grams of protein)   DVT prophylaxis:  enoxaparin (LOVENOX) injection 40 mg Start: 07/01/22 1230 SCDs Start: 06/30/22 2019  Code Status: Full code Family Communication: Updated patient's daughters earlier this morning, sisters and other family members this afternoon. Level of care: Med-Surg Status is: Inpatient Remains inpatient appropriate because: Acute metabolic encephalopathy, dehydration and lethargy.   Final disposition: To be determined. Consultants:  Neurology Pulmonology Gastroenterology  55 minutes with more than 50% spent in reviewing records, counseling  patient/family and coordinating care.   Sch Meds:  Scheduled Meds:  bisacodyl  10 mg Rectal Daily   enoxaparin (LOVENOX) injection  40 mg Subcutaneous Q24H   feeding supplement  237 mL Oral BID BM   fluticasone  2 spray Each Nare q morning   insulin aspart  0-6 Units Subcutaneous TID WC   magic mouthwash  w/lidocaine  5 mL Oral QID   metoprolol tartrate  5 mg Intravenous Q8H   polyethylene glycol  17 g Oral BID   sodium chloride HYPERTONIC  4 mL Nebulization BID   [START ON 07/10/2022] thiamine (VITAMIN B1) injection  100 mg Intravenous Daily   Continuous Infusions:  dextrose 5 % with KCl 20 mEq / L 20 mEq (07/03/22 1458)   thiamine (VITAMIN B1) injection 500 mg (07/03/22 1500)   Followed by   Derrill Memo ON 07/04/2022] thiamine (VITAMIN B1) injection     PRN Meds:.acetaminophen **OR** acetaminophen, gadobutrol, guaiFENesin, ipratropium-albuterol, menthol-cetylpyridinium, milk and molasses, ondansetron (ZOFRAN) IV  Antimicrobials: Anti-infectives (From admission, onward)    Start     Dose/Rate Route Frequency Ordered Stop   07/01/22 2200  vancomycin (VANCOCIN) IVPB 1000 mg/200 mL premix  Status:  Discontinued        1,000 mg 200 mL/hr over 60 Minutes Intravenous Every 24 hours 06/30/22 2136 07/01/22 1047   07/01/22 0800  ceFEPIme (MAXIPIME) 2 g in sodium chloride 0.9 % 100 mL IVPB  Status:  Discontinued        2 g 200 mL/hr over 30 Minutes Intravenous Every 8 hours 07/01/22 0735 07/01/22 1047   06/30/22 2145  ceFEPIme (MAXIPIME) 2 g in sodium chloride 0.9 % 100 mL IVPB  Status:  Discontinued        2 g 200 mL/hr over 30 Minutes Intravenous Every 12 hours 06/30/22 2136 07/01/22 0735   06/30/22 2145  vancomycin (VANCOREADY) IVPB 2000 mg/400 mL        2,000 mg 200 mL/hr over 120 Minutes Intravenous  Once 06/30/22 2136 07/01/22 0204        I have personally reviewed the following labs and images: CBC: Recent Labs  Lab 06/30/22 1406 06/30/22 2000 07/01/22 0420 07/01/22 0830 07/02/22 0834 07/03/22 0742  WBC 7.2  --  9.3  --  8.0 6.4  NEUTROABS 4.7  --  6.1  --  5.3 3.7  HGB 15.2* 15.0 13.1 13.9 13.6 13.2  HCT 46.8* 44.0 40.5 41.0 42.8 41.3  MCV 87.5  --  87.1  --  86.1 87.7  PLT 192  --  223  --  172 132*   BMP &GFR Recent Labs  Lab 06/30/22 1406 06/30/22 1938 06/30/22 2000  07/01/22 0420 07/01/22 0830 07/02/22 0834 07/03/22 0742  NA 140  --  140 138 144 144 148*  K 4.5  --  4.3 4.0 3.7 3.4* 3.6  CL 98  --   --  101  --  109 111  CO2 31  --   --  29  --  27 29  GLUCOSE 164*  --   --  136*  --  129* 114*  BUN 20  --   --  19  --  17 17  CREATININE 0.97  --   --  0.81  --  0.79 0.82  CALCIUM 10.3  --   --  9.6  --  9.9 9.8  MG  --  2.3  --  2.3  --  2.2 2.3  PHOS  --  3.1  --  2.7  --  2.3* 2.5   Estimated Creatinine Clearance: 58 mL/min (by C-G formula based on SCr of 0.82 mg/dL). Liver & Pancreas: Recent Labs  Lab 06/30/22 1406 07/01/22 0420 07/02/22 0834 07/03/22 0742  AST 113* 72* 55* 58*  ALT 74* 45* 42 40  ALKPHOS 89 67 67 64  BILITOT 0.5 1.4* 1.4* 1.1  PROT 8.3* 6.8 6.7 6.0*  ALBUMIN 2.5* 2.0* 2.1* 1.9*   Recent Labs  Lab 06/30/22 1406  LIPASE 24   Recent Labs  Lab 06/30/22 1952  AMMONIA 15   Diabetic: No results for input(s): "HGBA1C" in the last 72 hours. Recent Labs  Lab 07/02/22 1205 07/02/22 1654 07/02/22 2144 07/03/22 0816 07/03/22 1211  GLUCAP 125* 128* 104* 102* 99   Cardiac Enzymes: Recent Labs  Lab 06/30/22 2015  CKTOTAL 21*   No results for input(s): "PROBNP" in the last 8760 hours. Coagulation Profile: Recent Labs  Lab 07/01/22 0505  INR 1.0   Thyroid Function Tests: Recent Labs    06/30/22 1952  TSH 1.727   Lipid Profile: No results for input(s): "CHOL", "HDL", "LDLCALC", "TRIG", "CHOLHDL", "LDLDIRECT" in the last 72 hours. Anemia Panel: Recent Labs    07/02/22 0834  VITAMINB12 1,172*   Urine analysis:    Component Value Date/Time   COLORURINE YELLOW 07/01/2022 0610   APPEARANCEUR CLEAR 07/01/2022 0610   LABSPEC 1.038 (H) 07/01/2022 0610   PHURINE 5.0 07/01/2022 0610   GLUCOSEU NEGATIVE 07/01/2022 0610   HGBUR NEGATIVE 07/01/2022 0610   BILIRUBINUR NEGATIVE 07/01/2022 0610   KETONESUR 5 (A) 07/01/2022 0610   PROTEINUR >=300 (A) 07/01/2022 0610   UROBILINOGEN 0.2 09/15/2011 0530    NITRITE NEGATIVE 07/01/2022 0610   LEUKOCYTESUR NEGATIVE 07/01/2022 0610   Sepsis Labs: Invalid input(s): "PROCALCITONIN", "LACTICIDVEN"  Microbiology: Recent Results (from the past 240 hour(s))  Resp panel by RT-PCR (RSV, Flu A&B, Covid) Anterior Nasal Swab     Status: None   Collection Time: 06/30/22  1:31 PM   Specimen: Anterior Nasal Swab  Result Value Ref Range Status   SARS Coronavirus 2 by RT PCR NEGATIVE NEGATIVE Final   Influenza A by PCR NEGATIVE NEGATIVE Final   Influenza B by PCR NEGATIVE NEGATIVE Final    Comment: (NOTE) The Xpert Xpress SARS-CoV-2/FLU/RSV plus assay is intended as an aid in the diagnosis of influenza from Nasopharyngeal swab specimens and should not be used as a sole basis for treatment. Nasal washings and aspirates are unacceptable for Xpert Xpress SARS-CoV-2/FLU/RSV testing.  Fact Sheet for Patients: EntrepreneurPulse.com.au  Fact Sheet for Healthcare Providers: IncredibleEmployment.be  This test is not yet approved or cleared by the [place] FDA and has been authorized for detection and/or diagnosis of SARS-CoV-2 by FDA under an Emergency Use Authorization (EUA). This EUA will remain in effect (meaning this test can be used) for the duration of the COVID-19 declaration under Section 564(b)(1) of the Act, 21 U.S.C. section 360bbb-3(b)(1), unless the authorization is terminated or revoked.     Resp Syncytial Virus by PCR NEGATIVE NEGATIVE Final    Comment: (NOTE) Fact Sheet for Patients: EntrepreneurPulse.com.au  Fact Sheet for Healthcare Providers: IncredibleEmployment.be  This test is not yet approved or cleared by the [place] FDA and has been authorized for detection and/or diagnosis of SARS-CoV-2 by FDA under an Emergency Use Authorization (EUA). This EUA will remain in effect (meaning this test can be used) for the duration of the COVID-19  declaration under Section 564(b)(1) of the Act, 21 U.S.C. section 360bbb-3(b)(1),  unless the authorization is terminated or revoked.  Performed at [facility], [place] [address] [place], [place] [address]   Culture, blood (routine x 2)     Status: None (Preliminary result)   Collection Time: 06/30/22  7:52 PM   Specimen: BLOOD  Result Value Ref Range Status   Specimen Description BLOOD LEFT ANTECUBITAL  Final   Special Requests   Final    BOTTLES DRAWN AEROBIC AND ANAEROBIC Blood Culture adequate volume   Culture   Final    NO GROWTH 3 DAYS Performed at [facility], [place] [address] [place], [place] [address]    Report Status PENDING  Incomplete  Culture, blood (routine x 2)     Status: None (Preliminary result)   Collection Time: 06/30/22  8:28 PM   Specimen: BLOOD LEFT HAND  Result Value Ref Range Status   Specimen Description BLOOD LEFT HAND  Final   Special Requests   Final    BOTTLES DRAWN AEROBIC AND ANAEROBIC Blood Culture adequate volume   Culture   Final    NO GROWTH 3 DAYS Performed at [facility], [place] [address], [place], [place] [address]    Report Status PENDING  Incomplete  MRSA Next Gen by PCR, Nasal     Status: None   Collection Time: 06/30/22 11:21 PM  Result Value Ref Range Status   MRSA by PCR Next Gen NOT DETECTED NOT DETECTED Final    Comment: (NOTE) The GeneXpert MRSA Assay (FDA approved for NASAL specimens only), is one component of a comprehensive MRSA colonization surveillance program. It is not intended to diagnose MRSA infection nor to guide or monitor treatment for MRSA infections. Test performance is not FDA approved in patients less than 70 years old. Performed at [facility], [place] [address], [place], [place] [address]   CSF culture w Gram Stain     Status: None (Preliminary result)   Collection Time: 07/02/22  4:46 PM   Specimen: CSF; Cerebrospinal Fluid  Result Value Ref Range Status   Specimen Description CSF   Final   Special Requests LP  Final   Gram Stain NO WBC SEEN NO ORGANISMS SEEN CYTOSPIN SMEAR   Final   Culture   Final    NO GROWTH < 24 HOURS Performed at [facility], [place] [address] [place], [place] [address]    Report Status PENDING  Incomplete  Respiratory (~20 pathogens) panel by PCR     Status: None   Collection Time: 07/02/22  6:00 PM   Specimen: Nasopharyngeal Swab; Respiratory  Result Value Ref Range Status   Adenovirus NOT DETECTED NOT DETECTED Final   Coronavirus 229E NOT DETECTED NOT DETECTED Final    Comment: (NOTE) The Coronavirus on the Respiratory Panel, DOES NOT test for the novel  Coronavirus (2019 nCoV)    Coronavirus HKU1 NOT DETECTED NOT DETECTED Final   Coronavirus NL63 NOT DETECTED NOT DETECTED Final   Coronavirus OC43 NOT DETECTED NOT DETECTED Final   Metapneumovirus NOT DETECTED NOT DETECTED Final   Rhinovirus / Enterovirus NOT DETECTED NOT DETECTED Final   Influenza A NOT DETECTED NOT DETECTED Final   Influenza B NOT DETECTED NOT DETECTED Final   Parainfluenza Virus 1 NOT DETECTED NOT DETECTED Final   Parainfluenza Virus 2 NOT DETECTED NOT DETECTED Final   Parainfluenza Virus 3 NOT DETECTED NOT DETECTED Final   Parainfluenza Virus 4 NOT DETECTED NOT DETECTED Final   Respiratory Syncytial Virus NOT DETECTED  NOT DETECTED Final   Bordetella pertussis NOT DETECTED NOT DETECTED Final   Bordetella Parapertussis NOT DETECTED NOT DETECTED Final   Chlamydophila pneumoniae NOT DETECTED NOT DETECTED Final   Mycoplasma pneumoniae NOT DETECTED NOT DETECTED Final    Comment: Performed at [facility], [place] [address], [place], [place] [address]    Radiology Studies: EEG adult  Result Date: 07/20/2022 Lora Havens, MD     20-Jul-2022 12:57 PM Patient Name: NASHIRA MCGLYNN MRN: [id number] Epilepsy Attending: Lora Havens Referring Physician/Provider: Mercy Riding, MD Date: 07-20-22 Duration: 23.24 mins Patient history: 82 year old female  with progressive encephalopathy. EEG to evaluate for seizure Level of alertness: Awake, asleep AEDs during EEG study: None Technical aspects: This EEG study was done with scalp electrodes positioned according to the 10-20 International system of electrode placement. Electrical activity was reviewed with band pass filter of 1-70Hz , sensitivity of 7 uV/mm, display speed of 5mm/sec with a 60Hz  notched filter applied as appropriate. EEG data were recorded continuously and digitally stored.  Video monitoring was available and reviewed as appropriate. Description: No clear posterior dominant rhythm was seen. Sleep was characterized by sleep spindles (12 to 14 Hz), maximal frontocentral region. EEG showed continuous generalized 3 to 6 Hz theta-delta slowing. Hyperventilation and photic stimulation were not performed.   ABNORMALITY - Continuous slow, generalized IMPRESSION: This study is suggestive of moderate diffuse encephalopathy, nonspecific etiology. No seizures or epileptiform discharges were seen throughout the recording. Priyanka Barbra Sarks      Cleone Hulick T. Winslow  If 7PM-7AM, please contact night-coverage [email or web address] 07-20-22, 3:19 PM

## 2022-07-03 NOTE — TOC Initial Note (Signed)
Transition of Care [facility]) - Initial/Assessment Note    Patient Details  Name: Barbara Nelson MRN: [id number] Date of Birth: 1940/06/17  [facility] CM/SW Contact:    Benard Halsted, LCSW Phone Number: 07/03/2022, 2:01 PM  Clinical Narrative:                 CSW spoke with patient's daughter, Barbara Nelson. She reported patient is still lethargic and unable to eat and they have requested the MD check into a transfer to [facility] for further workup. CSW will continue to follow.     Barriers to Discharge: Continued Medical Work up   Patient Goals and CMS Choice Patient states their goals for this hospitalization and ongoing recovery are:: Improved health CMS Medicare.gov Compare Post Acute Care list provided to:: Patient Represenative (must comment) Choice offered to / list presented to : Adult [place] ownership interest in [facility].provided to:: Adult Children    Expected Discharge Plan and Services In-house Referral: Clinical Social Work     Living arrangements for the past 2 months: Single Family Home                                      Prior Living Arrangements/Services Living arrangements for the past 2 months: Single Family Home Lives with:: Adult Children Patient language and need for interpreter reviewed:: Yes Do you feel safe going back to the place where you live?: Yes      Need for Family Participation in Patient Care: Yes (Comment) Care giver support system in place?: Yes (comment) Current home services: DME Criminal Activity/Legal Involvement Pertinent to Current Situation/Hospitalization: No - Comment as needed  Activities of Daily Living   ADL Screening (condition at time of admission) Patient's cognitive ability adequate to safely complete daily activities?: No Is the patient deaf or have difficulty hearing?: No Does the patient have difficulty seeing, even when wearing glasses/contacts?: No Does the patient  have difficulty concentrating, remembering, or making decisions?: Yes Patient able to express need for assistance with ADLs?: No Does the patient have difficulty dressing or bathing?: Yes Independently performs ADLs?: No Communication: Needs assistance Is this a change from baseline?: Pre-admission baseline Dressing (OT): Needs assistance Is this a change from baseline?: Pre-admission baseline Grooming: Needs assistance Is this a change from baseline?: Pre-admission baseline Feeding: Needs assistance Is this a change from baseline?: Pre-admission baseline Bathing: Needs assistance Is this a change from baseline?: Pre-admission baseline Toileting: Needs assistance Is this a change from baseline?: Pre-admission baseline In/Out Bed: Needs assistance Is this a change from baseline?: Pre-admission baseline Walks in Home: Needs assistance Is this a change from baseline?: Pre-admission baseline Does the patient have difficulty walking or climbing stairs?: Yes Weakness of Legs: Both Weakness of Arms/Hands: Both  Permission Sought/Granted Permission sought to share information with : Facility Sport and exercise psychologist, Family Supports Permission granted to share information with : Yes, Verbal Permission Granted  Share Information with NAME: Barbara Nelson     Permission granted to share info w Relationship: Daughter  Permission granted to share info w Contact Information: [phone]  Emotional Assessment Appearance:: Appears stated age Attitude/Demeanor/Rapport: Unable to Assess Affect (typically observed): Unable to Assess Orientation: : Oriented to Self Alcohol / Substance Use: Not Applicable Psych Involvement: No (comment)  Admission diagnosis:  Altered mental status, unspecified altered mental status type [U31.49] Acute metabolic encephalopathy [F02.63] Nausea and vomiting, unspecified vomiting  type [R11.2] Patient Active Problem List   Diagnosis Date Noted   Unspecified atrial  fibrillation ([place]) 23/30/0762   Acute metabolic encephalopathy 26/33/3545   Hypercalcemia 06/30/2022   Dehydration 06/30/2022   Acute prerenal azotemia 06/30/2022   Transaminitis 06/30/2022   Constipation 06/30/2022   DM2 (diabetes mellitus, type 2) ([place]) 06/30/2022   Allergic rhinitis 06/30/2022   Chronic diastolic CHF (congestive heart failure) ([place]) 06/30/2022   Shortness of breath 06/20/2022   Acute hypoxic respiratory failure ([place]) 06/18/2022   Hyponatremia 06/18/2022   Body mass index (BMI) 39.0-39.9, adult 01/05/2022   Decreased estrogen level 01/05/2022   Prediabetes 01/05/2022   Dry eyes 01/05/2022   History of pericarditis 01/05/2022   Essential hypertension 01/05/2022   LBBB (left bundle branch block) 09/17/2017   Benign essential HTN 10/31/2015   Heart palpitations 10/31/2015   Lung mass 06/27/2013   Edema of extremities 06/27/2013   Pericarditis, viral    History of pericardiectomy 06/19/2013   Chronic pulmonary blastomycosis ([place]) 08/23/2012   Pulmonary nodule, right 03/09/2012   Pericardial effusion/ pericarditis 10/06/2011   PCP:  Cari Caraway, MD Pharmacy:   [id number] - [place], [place] - [address] [place] [address] Phone: [phone] Fax: [phone]     Social Determinants of Health (SDOH) Social History: SDOH Screenings   Food Insecurity: No Food Insecurity (07/01/2022)  Housing: Low Risk  (07/01/2022)  Transportation Needs: No Transportation Needs (07/01/2022)  Utilities: Not At Risk (07/01/2022)  Tobacco Use: Medium Risk (07/01/2022)   SDOH Interventions:     Readmission Risk Interventions     No data to display

## 2022-07-03 NOTE — Progress Notes (Signed)
Nutrition Follow-up  DOCUMENTATION CODES:   Not applicable  INTERVENTION:  Will discontinue Ensure Enlive as pt is now NPO per SLP recommendations.  If aligns with goals of care, recommend placement of Cortrak tube for initiation of tube feeds. Family is discussing goals of care. If NG tube is desired over the weekend can consider order for bedside placement of NG tube and confirmation with abdominal x-ray prior to use. If NG tube unable to be placed at bedside can consider consult to IR for placement under fluoroscopy.  If plan is for placement of Cortrak or NG tube for initiation of tube feeds recommend: -Initiate Osmolite 1.2 at 20 mL/hour and advance by 10 mL/hour every 8 hours to goal rate of 60 mL/hour. -Provides: 1728 kcal, 80 grams of protein, 1181 mL H2O daily  Monitor magnesium, potassium, and phosphorus daily for at least 3 days as pt is on dextrose-containing fluids, MD to replete as needed, as pt is at risk for refeeding syndrome. If pt is started on tube feeds, consider monitoring magnesium, potassium, and phosphorus BID for at least 3 days.  NUTRITION DIAGNOSIS:   Inadequate oral intake related to lethargy/confusion, dysphagia as evidenced by NPO status (pocketing/holding food).  Ongoing.  GOAL:   Patient will meet greater than or equal to 90% of their needs  Not met at this time.  MONITOR:   PO intake, Supplement acceptance, Labs, Weight trends  REASON FOR ASSESSMENT:   Consult Assessment of nutrition requirement/status  ASSESSMENT:   82 yo female admitted with acute metabolic encephalopathy with dehydration, vomiting likely secondary to fecal impaction. PMH include chronic CHF, chronic respiratory failure, controlled DM 2  2/14: diet downgraded to dysphagia 1 with thin liquids s/p SLP evaluation 2/16: pt now made NPO s/p SLP evaluation   Pt is encephalopathic/lethargic. Per review of chart able to only open eyes briefly before falling back asleep. Pt was  re-evaluated by SLP and now recommended to be NPO. RD team discussed with MD regarding Cortrak tube placement for initiation of enteral nutrition. Family has not yet made a decision regarding Cortrak tube placement. Service has now ended for the day. If NG tube is desired over the weekend, recommend order for RN to place at bedside and confirm placement via abdominal x-ray prior to use. Alternatively, pending difficulty with bedside placement, could also consult IR for placement over the weekend if unable to place at bedside.  Most recent wt 88.1 kg on 07/01/22 but pt also with documented non-pitting and mild pitting edema, so may be falsely elevated. Weights appear to be trending down per chart. Pt was 105.9 kg on 11/14/21. If weights in chart accurate, she has lost 17.8 kg or 16.8% weight over approximately 7.5 months, which would be significant for time frame.   Medications reviewed and include: Dulcolax 10 mg daily, Ensure Enlive BID, Novolog 0-6 units TID, magic mouthwash 5 mL QID, Miralax, thiamine 500 mg TID IV x 3 days followed by thiamine 250 mg daily IV x 5 days followed by thiamine 100 mg IV daily, D5 with Kcl 20 meQ/L at 75 mL/hour  Labs reviewed: CBG 99-128, Sodium 148  UOP: 200 mL (0.1 mL/kg/hr) + 2 occurrences unmeasured UOP  I/O: +450 mL since admission  Diet Order:   Diet Order             Diet NPO time specified  Diet effective now                  EDUCATION NEEDS:  Not appropriate for education at this time  Skin:  Skin Assessment: Skin Integrity Issues: Skin Integrity Issues:: Other (Comment) Other: wound to right buttocks (no other details or staging documented at this time)  Last BM:  PTA  Height:   Ht Readings from Last 1 Encounters:  06/18/22 5\' 5"  (1.651 m)   Weight:   Wt Readings from Last 1 Encounters:  07/01/22 88.1 kg   BMI:  Body mass index is 32.32 kg/m.  Estimated Nutritional Needs:   Kcal:  1550-1750 kcals  Protein:  75-85 g  Fluid:   >/= 1.5 L  Geary Rufo Magda Paganini, MS, RD, LDN, CNSC Pager number available on Amion

## 2022-07-03 NOTE — Procedures (Signed)
Patient Name: Barbara Nelson  MRN: [id number]  Epilepsy Attending: Lora Havens  Referring Physician/Provider: Mercy Riding, MD  Date: 07/03/2022 Duration: 23.24 mins  Patient history: 82 year old female with progressive encephalopathy. EEG to evaluate for seizure  Level of alertness: Awake, asleep  AEDs during EEG study: None  Technical aspects: This EEG study was done with scalp electrodes positioned according to the 10-20 International system of electrode placement. Electrical activity was reviewed with band pass filter of 1-70Hz , sensitivity of 7 uV/mm, display speed of 52mm/sec with a 60Hz  notched filter applied as appropriate. EEG data were recorded continuously and digitally stored.  Video monitoring was available and reviewed as appropriate.  Description: No clear posterior dominant rhythm was seen. Sleep was characterized by sleep spindles (12 to 14 Hz), maximal frontocentral region. EEG showed continuous generalized 3 to 6 Hz theta-delta slowing. Hyperventilation and photic stimulation were not performed.     ABNORMALITY - Continuous slow, generalized  IMPRESSION: This study is suggestive of moderate diffuse encephalopathy, nonspecific etiology. No seizures or epileptiform discharges were seen throughout the recording.  [name]

## 2022-07-03 NOTE — Progress Notes (Signed)
Speech Language Pathology Treatment: Dysphagia  Patient Details Name: Barbara Nelson MRN: [id number] DOB: [birth year] Today's Date: 07/03/2022 Time: 9381-8299 SLP Time Calculation (min) (ACUTE ONLY): 17 min  Assessment / Plan / Recommendation Clinical Impression  Pt was seen for dysphagia treatment with her siblings present. She was lethargic throughout the session, but opened her eyes and roused slightly with verbal and tactile stimulation. Pt accepted a single ice chip, but no swallowing was noted thereafter despite prompts and cues and the melted ice chip ultimately spilled from her mouth she she tilted her head downwards. Per reports, pt has not been alert enough for meals since admission and p.o. meds have been held. At this point, an NPO status is recommended and SLP is in agreement with RD regarding enteral nutrition (e.g., Cortrak). [name] family was advised of this and pt's sister expressed that they have told the MD that they would like a GI consult prior to initiating enteral nutrition since they are concerned since she reportedly has not had a BM since admission. Results and recommendations discussed with Dr. Cyndia Skeeters who is in agreement. SLP will follow to assess improvement in swallow function with alertness.      HPI HPI: Pt is a 82 y.o. female who presented with AMS and was admitted with acute metabolic encephalopathy in the setting of HCAP pneumonia. CXR 2/13: Minimal strandy and hazy opacities in both lung bases may  represent atelectasis or infection. PMH: type 2 diabetes mellitus, essential hypertension, chronic diastolic heart failure, chronic hypoxic respiratory failure. BSE 06/20/22 regular texture diet with thin liquids recommended.      SLP Plan  Continue with current plan of care      Recommendations for follow up therapy are one component of a multi-disciplinary discharge planning process, led by the attending physician.  Recommendations may be updated based on patient  status, additional functional criteria and insurance authorization.    Recommendations  Diet recommendations: NPO Medication Administration: Via alternative means                Oral Care Recommendations: Oral care BID Follow Up Recommendations:  (TBD) Assistance recommended at discharge: Frequent or constant Supervision/Assistance SLP Visit Diagnosis: Dysphagia, unspecified (R13.10) Plan: Continue with current plan of care         Barbara Nelson I. Hardin Negus, [place], [place] Office number [phone]  Barbara Nelson  07/03/2022, 12:52 PM

## 2022-07-04 DIAGNOSIS — K5900 Constipation, unspecified: Secondary | ICD-10-CM | POA: Diagnosis not present

## 2022-07-04 DIAGNOSIS — I5032 Chronic diastolic (congestive) heart failure: Secondary | ICD-10-CM | POA: Diagnosis not present

## 2022-07-04 DIAGNOSIS — N19 Unspecified kidney failure: Secondary | ICD-10-CM | POA: Diagnosis not present

## 2022-07-04 DIAGNOSIS — G9341 Metabolic encephalopathy: Secondary | ICD-10-CM | POA: Diagnosis not present

## 2022-07-04 LAB — COMPREHENSIVE METABOLIC PANEL
ALT: 40 U/L (ref 0–44)
AST: 62 U/L — ABNORMAL HIGH (ref 15–41)
Albumin: 1.7 g/dL — ABNORMAL LOW (ref 3.5–5.0)
Alkaline Phosphatase: 67 U/L (ref 38–126)
Anion gap: 5 (ref 5–15)
BUN: 14 mg/dL (ref 8–23)
CO2: 28 mmol/L (ref 22–32)
Calcium: 9.2 mg/dL (ref 8.9–10.3)
Chloride: 111 mmol/L (ref 98–111)
Creatinine, Ser: 0.76 mg/dL (ref 0.44–1.00)
GFR, Estimated: >60 mL/min (ref >60)
Glucose, Bld: 160 mg/dL — ABNORMAL HIGH (ref 70–99)
Potassium: 3.5 mmol/L (ref 3.5–5.1)
Sodium: 144 mmol/L (ref 135–145)
Total Bilirubin: 0.9 mg/dL (ref 0.3–1.2)
Total Protein: 5.7 g/dL — ABNORMAL LOW (ref 6.5–8.1)

## 2022-07-04 LAB — CBC
HCT: 41.4 % (ref 36.0–46.0)
Hemoglobin: 13.2 g/dL (ref 12.0–15.0)
MCH: 27.7 pg (ref 26.0–34.0)
MCHC: 31.9 g/dL (ref 30.0–36.0)
MCV: 86.8 fL (ref 80.0–100.0)
Platelets: 101 10*3/uL — ABNORMAL LOW (ref 150–400)
RBC: 4.77 MIL/uL (ref 3.87–5.11)
RDW: 15.4 % (ref 11.5–15.5)
WBC: 5.9 10*3/uL (ref 4.0–10.5)
nRBC: 0 % (ref 0.0–0.2)

## 2022-07-04 LAB — THYROID PEROXIDASE ANTIBODY: Thyroperoxidase Ab SerPl-aCnc: 12 IU/mL (ref 0–34)

## 2022-07-04 LAB — GLUCOSE, CAPILLARY
Glucose-Capillary: 152 mg/dL — ABNORMAL HIGH (ref 70–99)
Glucose-Capillary: 113 mg/dL — ABNORMAL HIGH (ref 70–99)
Glucose-Capillary: 127 mg/dL — ABNORMAL HIGH (ref 70–99)

## 2022-07-04 LAB — PHOSPHORUS: Phosphorus: 2 mg/dL — ABNORMAL LOW (ref 2.5–4.6)

## 2022-07-04 LAB — RPR: RPR Ser Ql: NONREACTIVE

## 2022-07-04 LAB — MAGNESIUM: Magnesium: 2.1 mg/dL (ref 1.7–2.4)

## 2022-07-04 LAB — PREALBUMIN: Prealbumin: 11 mg/dL — ABNORMAL LOW (ref 18–38)

## 2022-07-04 LAB — THYROGLOBULIN ANTIBODY: Thyroglobulin Antibody: <1 IU/mL (ref 0.0–0.9)

## 2022-07-04 MED ORDER — POTASSIUM PHOSPHATES 15 MMOLE/5ML IV SOLN
15.0000 mmol | Freq: Once | INTRAVENOUS | Status: AC
  Administered 2022-07-04: 15 mmol via INTRAVENOUS
  Filled 2022-07-04: qty 5

## 2022-07-04 MED ORDER — FLEET ENEMA 7-19 GM/118ML RE ENEM: 1.0000 | ENEMA | Freq: Once | RECTAL | Status: DC

## 2022-07-04 MED ORDER — FLEET ENEMA 7-19 GM/118ML RE ENEM
1.0000 | ENEMA | Freq: Once | RECTAL | Status: AC
  Administered 2022-07-04: 1 via RECTAL
  Filled 2022-07-04: qty 1

## 2022-07-04 MED ORDER — FLUCONAZOLE 150 MG PO TABS
150.0000 mg | ORAL_TABLET | Freq: Once | ORAL | Status: DC
  Filled 2022-07-04: qty 1

## 2022-07-04 MED ORDER — NYSTATIN 100000 UNIT/ML MT SUSP
5.0000 mL | Freq: Four times a day (QID) | OROMUCOSAL | Status: DC
  Administered 2022-07-04 – 2022-07-09 (×22): 500000 [IU] via ORAL
  Filled 2022-07-04 (×20): qty 5

## 2022-07-04 NOTE — Progress Notes (Signed)
Speech Language Pathology Treatment: Dysphagia  Patient Details Name: Barbara Nelson MRN: [id number] DOB: 03/08/1941 Today's Date: 07/04/2022 Time: 8341-9622 SLP Time Calculation (min) (ACUTE ONLY): 25 min  Assessment / Plan / Recommendation Clinical Impression  Pt was seen for skilled ST targeting PO trials to determine readiness for diet initiation.  Pt was encountered lethargic in bed with siblings present at bedside.  Sister reported that pt had been tolerating ice chips without coughing or choking given cues for mastication and swallow initiation.   Pt was repositioned in bed and was seen with trials of ice chips, thin liquid via pipette straw sip, and puree.  She exhibited intermittent oral holding and bolus expulsion with ice chips and with puree.  She was able to follow moderate-maximum cues to masticate ice chips and initiate a swallow (suspect delayed swallow initiation).  She did not achieve labial closure around the straw when presented, therefore liquid was passively accepted into the anterior portion of her oral cavity via pipette method.  Pt then achieved labial closure and swallow initiation.  She did not exhibit overt s/sx of aspiration with PO trials and vocal quality remained clear throughout this session.  Spoke in length with family regarding pt's increased risk for aspiration with all PO intake given current lethargy, dependence for feeding, and oral dysphagia.  Pt did not tolerate cortrak placement yesterday, therefore recommend trial initiation of clear liquids with strict adherence to aspiration precautions and 1:1 assistance.  Recommend medication be administered via alternative means.  Additional conversations regarding alternative means of nutrition may be warranted if PO intake does not improve.  Spoke with pt, family, RN, and MD regarding recommendations.  SLP will f/u to monitor diet tolerance and upgrade as able.     HPI HPI: Pt is a 82 y.o. female who presented with AMS  and was admitted with acute metabolic encephalopathy in the setting of HCAP pneumonia. CXR 2/13: Minimal strandy and hazy opacities in both lung bases may  represent atelectasis or infection. PMH: type 2 diabetes mellitus, essential hypertension, chronic diastolic heart failure, chronic hypoxic respiratory failure. BSE 06/20/22 regular texture diet with thin liquids recommended.      SLP Plan  Goals updated      Recommendations for follow up therapy are one component of a multi-disciplinary discharge planning process, led by the attending physician.  Recommendations may be updated based on patient status, additional functional criteria and insurance authorization.    Recommendations  Diet recommendations: Thin liquid Liquids provided via: Straw;Teaspoon Medication Administration: Via alternative means Supervision: Trained caregiver to feed patient Compensations: Slow rate;Small sips/bites;Minimize environmental distractions (Pipette liquid if pt unable to independently mobilize to oral cavity) Postural Changes and/or Swallow Maneuvers: Seated upright 90 degrees                Oral Care Recommendations: Oral care BID Follow Up Recommendations: Skilled nursing-short term rehab (<3 hours/day) Assistance recommended at discharge: Frequent or constant Supervision/Assistance SLP Visit Diagnosis: Dysphagia, unspecified (R13.10) Plan: Goals updated          Bretta Bang, M.S., [place] Office: [phone]  [place]  07/04/2022, 12:14 PM

## 2022-07-04 NOTE — Progress Notes (Signed)
Subjective: No significant changes  Exam: Vitals:   07/04/22 1232 07/04/22 1627  BP: (!) 122/91   Pulse: 86 84  Resp:    Temp:    SpO2: 97% 95%   Gen: In bed, NAD Resp: non-labored breathing, no acute distress Abd: soft, nt  Neuro: MS: She is lethargic and keeps eyes closed throughout most of the interview, but she does awaken and tell me her name.  She turns away from me and tries to avoid any stimulation when I try and ask her questions.  She does answer with one or two words, but not as much as she did yesterday but this appears to be volitional. CN: Blinks to threat bilaterally, EOMI Motor: She squeezes hands and plantar flexes to command bilaterally. Sensory: Endorses symmetric sensation  Pertinent Labs: B12 1172 ABG 7.4 8/45/116 UDS negative UA is negative BUN 17 Ammonia 15 TSH 1.727   B1-pending  Impression: 82 year old female with progressive encephalopathy over the past few months.  Her current presentation I think could very well be consistent with hypoactive delirium in the setting of fecal impaction.  She has had multiple medical problems of an inflammatory nature such as shingles followed by pneumonia followed by poor nutrition with multiple environmental changes which all could have resulted in progressive hypoactive delirium. I would favor treating her underlying medical issues, supplementing with nutrition, trying to reestablish a day night cycle with Seroquel.  If this does not improve, then could consider using a low-dose stimulant during the day.  Recommendations: 1) Seroquel 25 mg q. 8 PM 2) continue opening shades during day, minimizing stimulation at night 3) continue bowel regimen, encourage motility.   4) avoid deliriogenic medications 5) agree with thiamine supplementation 6) neurology will follow  Roland Rack, MD [facility] [phone]  If 7pm- 7am, please page neurology on call as listed in [place].

## 2022-07-04 NOTE — Consult Note (Signed)
Referring Provider: [facility] Primary Care Physician:  Cari Caraway, MD Primary Gastroenterologist: Sadie Haber primary  Reason for Consultation: Encephalopathy  HPI: Barbara Nelson is a 82 y.o. female with past medical history of pulmonary blastomycosis s/p right lower lobectomy, history of recent admission for community-acquired pneumonia, history of respiratory failure, history of chronic respiratory failure on 2 L oxygen, history of CHF, hypertension and diabetes admitted to the hospital with altered mental status.  Currently followed by neurology.  Patient subsequently found to have fecal impaction, constipation and vomiting.  GI is consulted for further evaluation.  CT abdomen pelvis with IV contrast on June 30, 2022 showed right pleural effusion as well as large amount of stool in the rectum concerning for impaction.  Normal Right upper quadrant ultrasound on February 14  Blood work this morning showed mild elevated AST at 62.  CBC showed platelet count of 101 otherwise normal CBC.  Platelet count was normal 2 days ago.  Patient seen and examined at bedside.  Multiple family members at bedside.  Patient had 2 enemas since hospitalization.  Patient is not having regular bowel movements.  Was seen by speech therapy yesterday and she is currently n.p.o. because of aspiration risk.  Family was not sure about feeding tube placement.  Nursing staff cannot do NG tube/Cortrak because of patient's agitation.   Past Medical History:  Diagnosis Date   Benign essential HTN 10/31/2015   Cardiac tamponade due to viral pericarditis    LBBB (left bundle branch block)    Lung mass    fungal infection s/p resection at [facility]   Postoperative atrial fibrillation [facility])    Pulmonary nodule     Past Surgical History:  Procedure Laterality Date   ABDOMINAL HYSTERECTOMY  1988   chest tube placement  09/03/11   LUNG REMOVAL, PARTIAL     right knee arthoscopy with meniscal repair     subxiphoid pericardial  window and drainage anterior partial pericardiectomy  09/03/11    Prior to Admission medications   Medication Sig Start Date End Date Taking? Authorizing Provider  acetaminophen (TYLENOL) 325 MG tablet Take 2 tablets (650 mg total) by mouth every 6 (six) hours as needed for mild pain (or Fever >/= 101). 06/26/22  Yes [name], Henreitta Leber, MD  albuterol (PROVENTIL) (2.5 MG/3ML) 0.083% nebulizer solution Take 3 mLs (2.5 mg total) by nebulization every 2 (two) hours as needed for wheezing or shortness of breath. 06/26/22  Yes [name], Henreitta Leber, MD  bisacodyl (DULCOLAX) 10 MG suppository Place 1 suppository (10 mg total) rectally daily as needed for moderate constipation. 06/26/22  Yes [name], Henreitta Leber, MD  diclofenac Sodium (VOLTAREN) 1 % GEL Apply 2 g topically 4 (four) times daily. Patient taking differently: Apply 2 g topically 4 (four) times daily. 0900, 1300, 1700, 2100 06/26/22  Yes [name], Henreitta Leber, MD  feeding supplement (ENSURE ENLIVE / ENSURE PLUS) LIQD Take 237 mLs by mouth 3 (three) times daily between meals. Patient taking differently: Take 237 mLs by mouth 3 (three) times daily between meals. 1000, 1400, 2200 06/26/22  Yes [name], Henreitta Leber, MD  fluticasone (FLONASE) 50 MCG/ACT nasal spray Place 2 sprays into both nostrils daily. Patient taking differently: Place 2 sprays into both nostrils every morning. 06/26/22  Yes [name], Henreitta Leber, MD  furosemide (LASIX) 40 MG tablet Take 1 tablet (40 mg total) by mouth daily. Patient taking differently: Take 40 mg by mouth every morning. 06/26/22 06/26/23 Yes [name], Henreitta Leber, MD  insulin aspart (NOVOLOG) 100 UNIT/ML  injection 0-9 Units, Subcutaneous, 3 times daily with meals,  CBG < 70: Implement Hypoglycemia measures  CBG 70 - 120: 0 units  CBG 121 - 150: 1 unit  CBG 151 - 200: 2 units  CBG 201 - 250: 3 units  CBG 251 - 300: 5 units  CBG 301 - 350: 7 units  CBG 351 - 400: 9 units  CBG > 400: call MD 06/26/22  Yes [name], Henreitta Leber, MD  magic  mouthwash SOLN Take 15 mLs by mouth 4 (four) times daily as needed for mouth pain. Patient taking differently: Take 15 mLs by mouth 4 (four) times daily as needed for mouth pain. 0900, 1300, 1700, 2100 06/26/22  Yes [name], Henreitta Leber, MD  magnesium hydroxide (MILK OF MAGNESIA) 400 MG/5ML suspension Take 15 mLs by mouth daily as needed for mild constipation. 06/26/22  Yes [name], Henreitta Leber, MD  metFORMIN (GLUCOPHAGE-XR) 500 MG 24 hr tablet Take 500 mg by mouth daily with supper.   Yes [provider]  metoprolol succinate (TOPROL-XL) 25 MG 24 hr tablet Take 0.5 tablets (12.5 mg total) by mouth daily. 06/26/22  Yes [name], Henreitta Leber, MD  Multiple Vitamin (MULTIVITAMIN WITH MINERALS) TABS tablet Take 1 tablet by mouth daily. Patient taking differently: Take 1 tablet by mouth every morning. 06/26/22  Yes [name], Henreitta Leber, MD  ondansetron (ZOFRAN) 4 MG tablet Take 4 mg by mouth every 6 (six) hours as needed for nausea or vomiting.   Yes [provider]  OXYGEN Inhale 2 L into the lungs daily.   Yes [provider]  polyethylene glycol (MIRALAX / GLYCOLAX) 17 g packet Take 17 g by mouth daily. Patient taking differently: Take 17 g by mouth every morning. 06/27/22  Yes [name], Henreitta Leber, MD  potassium chloride SA (KLOR-CON M) 20 MEQ tablet Take 1 tablet (20 mEq total) by mouth daily. Patient taking differently: Take 20 mEq by mouth every morning. 06/26/22  Yes [name], Henreitta Leber, MD  Propylene Glycol (SYSTANE COMPLETE) 0.6 % SOLN Place 2 drops into both eyes 3 (three) times daily. 0900, 1300, 1700   Yes [provider]  senna-docusate (SENOKOT-S) 8.6-50 MG tablet Take 1 tablet by mouth at bedtime. 06/27/22  Yes [name], Henreitta Leber, MD  triamcinolone ointment (KENALOG) 0.5 % Apply 1 Application topically daily as needed (itching). Apply topically to chest every day as needed for itching.   Yes [provider]  Potassium Chloride 10 % SOLN Take 15 mLs by mouth every  morning. Mix 15 mL (20 MEQ) with juice and drink by mouth once daily for supplement.    [provider]    Scheduled Meds:  bisacodyl  10 mg Rectal Daily   enoxaparin (LOVENOX) injection  40 mg Subcutaneous Q24H   fluticasone  2 spray Each Nare q morning   insulin aspart  0-6 Units Subcutaneous TID WC   magic mouthwash w/lidocaine  5 mL Oral QID   metoprolol tartrate  5 mg Intravenous Q8H   polyethylene glycol  17 g Oral BID   QUEtiapine  25 mg Oral QHS   sodium chloride HYPERTONIC  4 mL Nebulization BID   [START ON 07/10/2022] thiamine (VITAMIN B1) injection  100 mg Intravenous Daily   Continuous Infusions:  dextrose 5 % with KCl 20 mEq / L 20 mEq (07/04/22 0646)   thiamine (VITAMIN B1) injection Stopped (07/04/22 9622)   Followed by   thiamine (VITAMIN B1) injection 250 mg (07/04/22 0842)   PRN Meds:.acetaminophen **OR** acetaminophen,  gadobutrol, guaiFENesin, ipratropium-albuterol, menthol-cetylpyridinium, milk and molasses, ondansetron (ZOFRAN) IV  Allergies as of 06/30/2022 - Review Complete 06/30/2022  Allergen Reaction Noted   Latex Rash 08/04/2012   Penicillins Rash and Itching 08/03/2013   Tape Hives and Rash 10/06/2011   Z-pak [azithromycin] Rash and Other (See Comments) 06/28/2014   Eggs or egg-derived products Nausea And Vomiting 12/15/2021   Amoxil [amoxicillin] Rash 06/27/2013   Sulfa antibiotics Rash 10/03/2018    Family History  Problem Relation Age of Onset   Hypertension Father    Stroke Father    Hypertension Mother    Ovarian cancer Mother    Prostate cancer Brother    Thyroid disease Sister    Hypertension Sister    Leukemia Maternal Aunt     Social History   Socioeconomic History   Marital status: Single    Spouse name: Not on file   Number of children: 4   Years of education: Not on file   Highest education level: Not on file  Occupational History   Occupation: unemployed Therapist, sports retired  Tobacco Use   Smoking status: Former     Packs/day: 1.50    Years: 10.00    Total pack years: 15.00    Types: Cigarettes    Quit date: 05/19/1979    Years since quitting: 43.1   Smokeless tobacco: Never  Vaping Use   Vaping Use: Never used  Substance and Sexual Activity   Alcohol use: No   Drug use: No   Sexual activity: Not on file  Other Topics Concern   Not on file  Social History Narrative   Not on file   Social Determinants of Health   Financial Resource Strain: Not on file  Food Insecurity: No Food Insecurity (07/01/2022)   Hunger Vital Sign    Worried About Running Out of Food in the Last Year: Never true    Ran Out of Food in the Last Year: Never true  Transportation Needs: No Transportation Needs (07/01/2022)   PRAPARE - Hydrologist (Medical): No    Lack of Transportation (Non-Medical): No  Physical Activity: Not on file  Stress: Not on file  Social Connections: Not on file  Intimate Partner Violence: Not At Risk (07/01/2022)   Humiliation, Afraid, Rape, and Kick questionnaire    Fear of Current or Ex-Partner: No    Emotionally Abused: No    Physically Abused: No    Sexually Abused: No    Review of Systems: Not able to obtain  Physical Exam: Vital signs: Vitals:   07/04/22 0750 07/04/22 0833  BP: (!) 147/90   Pulse: 78   Resp:    Temp: 97.9 F (36.6 C)   SpO2:  96%   Last BM Date :  (PTA) General:   Alert but confused.  Not following commands. Lungs: No Visible respiratory distress Heart:  Regular rate and rhythm; no murmurs, clicks, rubs,  or gallops. Abdomen: Soft, nontender, nondistended, bowel sounds present, no peritoneal signs  Rectal:  Deferred  GI:  Lab Results: Recent Labs    07/02/22 0834 07/03/22 0742 07/04/22 0810  WBC 8.0 6.4 5.9  HGB 13.6 13.2 13.2  HCT 42.8 41.3 41.4  PLT 172 132* 101*   BMET Recent Labs    07/02/22 0834 07/03/22 0742 07/04/22 0810  NA 144 148* 144  K 3.4* 3.6 3.5  CL 109 111 111  CO2 27 29 28   GLUCOSE 129*  114* 160*  BUN 17 17 14  CREATININE 0.79 0.82 0.76  CALCIUM 9.9 9.8 9.2   LFT Recent Labs    07/04/22 0810  PROT 5.7*  ALBUMIN 1.7*  AST 62*  ALT 40  ALKPHOS 67  BILITOT 0.9   PT/INR No results for input(s): "LABPROT", "INR" in the last 72 hours.   Studies/Results: EEG adult  Result Date: 07/03/2022 Lora Havens, MD     07/03/2022 12:57 PM Patient Name: Barbara Nelson MRN: [id number] Epilepsy Attending: Lora Havens Referring Physician/Provider: Mercy Riding, MD Date: 07/03/2022 Duration: 23.24 mins Patient history: 82 year old female with progressive encephalopathy. EEG to evaluate for seizure Level of alertness: Awake, asleep AEDs during EEG study: None Technical aspects: This EEG study was done with scalp electrodes positioned according to the 10-20 International system of electrode placement. Electrical activity was reviewed with band pass filter of 1-70Hz , sensitivity of 7 uV/mm, display speed of 37mm/sec with a 60Hz  notched filter applied as appropriate. EEG data were recorded continuously and digitally stored.  Video monitoring was available and reviewed as appropriate. Description: No clear posterior dominant rhythm was seen. Sleep was characterized by sleep spindles (12 to 14 Hz), maximal frontocentral region. EEG showed continuous generalized 3 to 6 Hz theta-delta slowing. Hyperventilation and photic stimulation were not performed.   ABNORMALITY - Continuous slow, generalized IMPRESSION: This study is suggestive of moderate diffuse encephalopathy, nonspecific etiology. No seizures or epileptiform discharges were seen throughout the recording. Priyanka Barbra Sarks    Impression/Plan: -Constipation and fecal impaction seen on the CT scan few days ago.  She is not having regular bowel movements according to family members. -Nausea and vomiting.  Improving. -Encephalopathy unknown etiology.  Neurology following.  Currently n.p.o. status per speech  recommendations.  Recommendations -------------------------- -Case discussed with hospitalist on the floor.  Unfortunately, we will not be able to advance diet unless patient is cleared by speech therapy. -Family is concern for oral thrush.  I was not able to do oral exam as patient was not cooperative.  Start nystatin swish and swallow if able to cooperate. -2 fleets enema today.  Repeat abdominal x-ray tomorrow to check for stool burden. -Discussed with family at bedside.  GI will follow.     LOS: 4 days   Otis Brace  MD, FACP 07/04/2022, 9:28 AM  Contact #  [phone]

## 2022-07-04 NOTE — Progress Notes (Signed)
PROGRESS NOTE  Barbara Nelson FXT:[id number] DOB: 1940-12-13   PCP: Cari Caraway, MD  Patient is from: SNF  DOA: 06/30/2022 LOS: 4  Chief complaints Chief Complaint  Patient presents with   Fatigue   Abdominal Pain   Emesis     Brief Narrative / Interim history: 82 year old F with PMH of diastolic CHF, chronic hypoxic RF on 2 L, HTN, and recent hospitalization from 2/1-10 with CAP and acute on chronic CHF returning with altered mental status, lethargy and vomiting, and admitted with working diagnosis of HCAP and encephalopathy.  Family denies history of dementia or cognitive impairment.  They significant decline in cognition since she was diagnosed with shingles in January and started on gabapentin.  She was weaned off gabapentin but she continued to decline, even worse after her recent hospitalization.  She is currently not on any sedating medication.  In ED, slightly hypertensive.  Not febrile.  Saturating in upper 90s to 100 on 2 L.  No leukocytosis.  Hgb 15.2 (12.4 about 12 days ago).  Slightly elevated LFT and Cr.  Lipase, BNP, ammonia, TSH, CK and procalcitonin within normal.  Lactic acid 2.1.  VBG suggess mild metabolic alkalosis.  UDS negative.  UA with > 300 protein.  XR with minimal strandy and hazy opacity at both lung bases suggesting atelectasis or infection, small left pleural effusion and stable cardiomegaly.  CT abdomen and pelvis with contrast showed large amount of stool in the rectum concerning for impaction.  She had disimpaction in ED.  CT head without acute finding.  MRI brain without acute finding but limited by motion.  Patient was started on IVF, IV cefepime and vancomycin and admitted with working diagnosis of HCAP, encephalopathy and dehydration.  The next day, pneumonia or infection felt to be less likely.  Blood cultures negative.  Antibiotic discontinued.  Further encephalopathy workup including ABG, UDS and B12 unrevealing.  Neurology consulted and she had  lumbar puncture.  CSF studies unrevealing so far.  EEG suggest moderate diffuse encephalopathy but no seizure or epileptiform discharge.  Started on high-dose thiamine without significant improvement.  Family requested transfer to [place] but both at full capacity.  Neurology following.   Subjective: Seen and examined earlier this morning.  No major events overnight of this morning.  Sleepy but wakes to voice.  Responds no to pain.  Follows some commands.  Taking some clears and ice chips by mouth with the help of family member.  Objective: Vitals:   07/04/22 0500 07/04/22 0750 07/04/22 0833 07/04/22 1232  BP:  (!) 147/90  (!) 122/91  Pulse: 88 78  86  Resp:      Temp:  97.9 F (36.6 C)    TempSrc:  Axillary    SpO2: 94%  96% 97%  Weight: 88 kg       Examination:  GENERAL: No apparent distress.  Nontoxic. HEENT: MMM.  Vision and hearing grossly intact.  NECK: Supple.  No apparent JVD.  RESP:  No IWOB.  Fair aeration bilaterally. CVS:  RRR. Heart sounds normal.  ABD/GI/GU: BS+. Abd soft, NTND.  MSK/EXT:   No apparent deformity. Moves extremities. No edema.  SKIN: no apparent skin lesion or wound NEURO: Sleepy but wakes to voice.  Follows some commands.  PERRL.  No facial asymmetry.  Grip strength symmetric.  Patellar reflex symmetric.  Further exam limited by mental status. PSYCH: Calm.  No distress or agitation.  Procedures:  2/15-lumbar puncture.  Microbiology summarized: [id number], influenza and RSV  PCR nonreactive MRSA PCR screen nonreactive Blood cultures NGTD CSF cultures NGTD.  Assessment and plan: Principal Problem:   Acute metabolic encephalopathy Active Problems:   Acute hypoxic respiratory failure (HCC)   Essential hypertension   Hypercalcemia   Dehydration   Acute prerenal azotemia   Transaminitis   Constipation   DM2 (diabetes mellitus, type 2) (HCC)   Allergic rhinitis   Chronic diastolic CHF (congestive heart failure) (HCC)  Acute metabolic  encephalopathy: Initially felt to be dehydration and delirium but mental status worse despite hydration.  Encephalopathy workup including CT head, MRI brain, ammonia, TSH, VBG, UDS, B12, CSF study and EEG unrevealing.  Delirium remains on differential.  I do not know if this could be catatonia.  Nutrition could play a role.  Mental status slightly better today.  She wakes to voice and follows some commands.  Able to take some clears and ice chips from family members. -Neurology following-recommended trying Seroquel at night.  Did not receive this last night -Reorientation and delirium precaution -Continue high-dose thiamine -Follow thiamine level and VZV IgG -Patient resisted NGT placement on 2/16. -Family concerned that she may require restraints if feeding tube is placed -Check prealbumin -SLP recommended clear liquid diet for now -Fall and aspiration precaution. -PT/OT/SLP/RD eval. -[facility] called for transfer but at capacity.  Dehydration: Likely due to poor intake and vomiting.  Seems to have resolved with IV hydration. -Continue D5 with KCl in the setting of hyponatremia and hypokalemia  Vomiting/constipation/possible fecal impaction: Likely due to constipation.  Only had 1 episode of emesis since admission but has not been taking p.o. either.  Has mildly elevated AST.  CT without acute finding other than fecal impaction.  RUQ [place] negative. -Appreciate input by GI-to Fleet enemas today and repeat x-ray in the morning -Continue bowel regimen -Antiemetics as needed -Continue IV fluid  Elevated LFT:  CK within normal.  LFT improved.  RUQ [place] negative.  AST improved. -Continue monitoring  Mild hyponatremia/hypokalemia -Continue D5 with KCl.  Chronic diastolic CHF: TTE on 2/2 with LVEF of 50 to 55% and G1-DD.  She was somewhat dehydrated on presentation.  She seems to be on Lasix.  No CHF on chest x-ray. -Continue holding Lasix -Strict intake and output, daily weights, renal  functions and electrolytes  Chronic hypoxic respiratory failure: On 2 L at baseline.  Stable. -Minimum oxygen to keep saturation above 90%. -Try incentive spirometry and pulmonary toilet  Controlled IDDM-2 with hyperglycemia: A1c 6.9%. -Continue current insulin regimen  Hypercalcemia: Likely secondary due to dehydration.  Resolved with hydration.  Generalized weakness/lethargy: In the setting of encephalopathy as above. -PT/OT eval as above  HCAP/pneumonia ruled out: No fever, leukocytosis.  Procalcitonin within normal.  COVID-19, influenza and RSV PCR nonreactive.  MRSA PCR negative.  Full RVP negative.  Blood cultures NGTD.  Cough likely due to silent aspiration in the setting of encephalopathy and vomiting. -Appreciate input by pulmonology. -Continue aspiration precaution and pulmonary toilet.  Obesity/decreased oral intake: Decreased oral intake likely due to encephalopathy.  I recommended starting tube feed with cortrack as soon as possible.  Family undecided about this.  Body mass index is 32.28 kg/m. Nutrition Problem: Inadequate oral intake Etiology: lethargy/confusion, dysphagia Signs/Symptoms: NPO status (pocketing/holding food) Interventions: Refer to RD note for recommendations, Ensure Enlive (each supplement provides 350kcal and 20 grams of protein)   DVT prophylaxis:  enoxaparin (LOVENOX) injection 40 mg Start: 07/01/22 1230 SCDs Start: 06/30/22 2019  Code Status: Full code Family Communication: Updated patient's  sister's and other family member at bedside Level of care: Med-Surg Status is: Inpatient Remains inpatient appropriate because: Acute metabolic encephalopathy, dehydration and lethargy.   Final disposition: To be determined. Consultants:  Neurology Pulmonology Gastroenterology  55 minutes with more than 50% spent in reviewing records, counseling patient/family and coordinating care.   Sch Meds:  Scheduled Meds:  bisacodyl  10 mg Rectal Daily    enoxaparin (LOVENOX) injection  40 mg Subcutaneous Q24H   fluticasone  2 spray Each Nare q morning   insulin aspart  0-6 Units Subcutaneous TID WC   magic mouthwash w/lidocaine  5 mL Oral QID   metoprolol tartrate  5 mg Intravenous Q8H   nystatin  5 mL Oral QID   polyethylene glycol  17 g Oral BID   QUEtiapine  25 mg Oral QHS   sodium chloride HYPERTONIC  4 mL Nebulization BID   sodium phosphate  1 enema Rectal Once   [START ON 07/10/2022] thiamine (VITAMIN B1) injection  100 mg Intravenous Daily   Continuous Infusions:  dextrose 5 % with KCl 20 mEq / L 20 mEq (07/04/22 0646)   potassium PHOSPHATE IVPB (in mmol) 15 mmol (07/04/22 1236)   thiamine (VITAMIN B1) injection 250 mg (07/04/22 0842)   PRN Meds:.acetaminophen **OR** acetaminophen, gadobutrol, guaiFENesin, ipratropium-albuterol, menthol-cetylpyridinium, milk and molasses, ondansetron (ZOFRAN) IV  Antimicrobials: Anti-infectives (From admission, onward)    Start     Dose/Rate Route Frequency Ordered Stop   07/01/22 2200  vancomycin (VANCOCIN) IVPB 1000 mg/200 mL premix  Status:  Discontinued        1,000 mg 200 mL/hr over 60 Minutes Intravenous Every 24 hours 06/30/22 2136 07/01/22 1047   07/01/22 0800  ceFEPIme (MAXIPIME) 2 g in sodium chloride 0.9 % 100 mL IVPB  Status:  Discontinued        2 g 200 mL/hr over 30 Minutes Intravenous Every 8 hours 07/01/22 0735 07/01/22 1047   06/30/22 2145  ceFEPIme (MAXIPIME) 2 g in sodium chloride 0.9 % 100 mL IVPB  Status:  Discontinued        2 g 200 mL/hr over 30 Minutes Intravenous Every 12 hours 06/30/22 2136 07/01/22 0735   06/30/22 2145  vancomycin (VANCOREADY) IVPB 2000 mg/400 mL        2,000 mg 200 mL/hr over 120 Minutes Intravenous  Once 06/30/22 2136 07/01/22 0204        I have personally reviewed the following labs and images: CBC: Recent Labs  Lab 06/30/22 1406 06/30/22 2000 07/01/22 0420 07/01/22 0830 07/02/22 0834 07/03/22 0742 07/04/22 0810  WBC 7.2  --  9.3   --  8.0 6.4 5.9  NEUTROABS 4.7  --  6.1  --  5.3 3.7  --   HGB 15.2*   < > 13.1 13.9 13.6 13.2 13.2  HCT 46.8*   < > 40.5 41.0 42.8 41.3 41.4  MCV 87.5  --  87.1  --  86.1 87.7 86.8  PLT 192  --  223  --  172 132* 101*   < > = values in this interval not displayed.   BMP &GFR Recent Labs  Lab 06/30/22 1406 06/30/22 1938 06/30/22 2000 07/01/22 0420 07/01/22 0830 07/02/22 0834 07/03/22 0742 07/04/22 0810  NA 140  --    < > 138 144 144 148* 144  K 4.5  --    < > 4.0 3.7 3.4* 3.6 3.5  CL 98  --   --  101  --  109 111 111  CO2 31  --   --  29  --  27 29 28   GLUCOSE 164*  --   --  136*  --  129* 114* 160*  BUN 20  --   --  19  --  17 17 14   CREATININE 0.97  --   --  0.81  --  0.79 0.82 0.76  CALCIUM 10.3  --   --  9.6  --  9.9 9.8 9.2  MG  --  2.3  --  2.3  --  2.2 2.3 2.1  PHOS  --  3.1  --  2.7  --  2.3* 2.5 2.0*   < > = values in this interval not displayed.   Estimated Creatinine Clearance: 59.4 mL/min (by C-G formula based on SCr of 0.76 mg/dL). Liver & Pancreas: Recent Labs  Lab 06/30/22 1406 07/01/22 0420 07/02/22 0834 07/03/22 0742 07/04/22 0810  AST 113* 72* 55* 58* 62*  ALT 74* 45* 42 40 40  ALKPHOS 89 67 67 64 67  BILITOT 0.5 1.4* 1.4* 1.1 0.9  PROT 8.3* 6.8 6.7 6.0* 5.7*  ALBUMIN 2.5* 2.0* 2.1* 1.9* 1.7*   Recent Labs  Lab 06/30/22 1406  LIPASE 24   Recent Labs  Lab 06/30/22 1952  AMMONIA 15   Diabetic: No results for input(s): "HGBA1C" in the last 72 hours. Recent Labs  Lab 07/03/22 0816 07/03/22 1211 07/03/22 1720 07/03/22 2144 07/04/22 1234  GLUCAP 102* 99 112* 125* 152*   Cardiac Enzymes: Recent Labs  Lab 06/30/22 2015  CKTOTAL 21*   No results for input(s): "PROBNP" in the last 8760 hours. Coagulation Profile: Recent Labs  Lab 07/01/22 0505  INR 1.0   Thyroid Function Tests: No results for input(s): "TSH", "T4TOTAL", "FREET4", "T3FREE", "THYROIDAB" in the last 72 hours.  Lipid Profile: No results for input(s): "CHOL",  "HDL", "LDLCALC", "TRIG", "CHOLHDL", "LDLDIRECT" in the last 72 hours. Anemia Panel: Recent Labs    07/02/22 0834  VITAMINB12 1,172*   Urine analysis:    Component Value Date/Time   COLORURINE YELLOW 07/01/2022 0610   APPEARANCEUR CLEAR 07/01/2022 0610   LABSPEC 1.038 (H) 07/01/2022 0610   PHURINE 5.0 07/01/2022 0610   GLUCOSEU NEGATIVE 07/01/2022 0610   HGBUR NEGATIVE 07/01/2022 0610   BILIRUBINUR NEGATIVE 07/01/2022 0610   KETONESUR 5 (A) 07/01/2022 0610   PROTEINUR >=300 (A) 07/01/2022 0610   UROBILINOGEN 0.2 09/15/2011 0530   NITRITE NEGATIVE 07/01/2022 0610   LEUKOCYTESUR NEGATIVE 07/01/2022 0610   Sepsis Labs: Invalid input(s): "PROCALCITONIN", "LACTICIDVEN"  Microbiology: Recent Results (from the past 240 hour(s))  Resp panel by RT-PCR (RSV, Flu A&B, Covid) Anterior Nasal Swab     Status: None   Collection Time: 06/30/22  1:31 PM   Specimen: Anterior Nasal Swab  Result Value Ref Range Status   SARS Coronavirus 2 by RT PCR NEGATIVE NEGATIVE Final   Influenza A by PCR NEGATIVE NEGATIVE Final   Influenza B by PCR NEGATIVE NEGATIVE Final    Comment: (NOTE) The Xpert Xpress SARS-CoV-2/FLU/RSV plus assay is intended as an aid in the diagnosis of influenza from Nasopharyngeal swab specimens and should not be used as a sole basis for treatment. Nasal washings and aspirates are unacceptable for Xpert Xpress SARS-CoV-2/FLU/RSV testing.  Fact Sheet for Patients: EntrepreneurPulse.com.au  Fact Sheet for Healthcare Providers: IncredibleEmployment.be  This test is not yet approved or cleared by the [place] FDA and has been authorized for detection and/or diagnosis of SARS-CoV-2 by FDA under an Emergency Use Authorization (EUA).  This EUA will remain in effect (meaning this test can be used) for the duration of the COVID-19 declaration under Section 564(b)(1) of the Act, 21 U.S.C. section 360bbb-3(b)(1), unless the authorization is  terminated or revoked.     Resp Syncytial Virus by PCR NEGATIVE NEGATIVE Final    Comment: (NOTE) Fact Sheet for Patients: EntrepreneurPulse.com.au  Fact Sheet for Healthcare Providers: IncredibleEmployment.be  This test is not yet approved or cleared by the [place] FDA and has been authorized for detection and/or diagnosis of SARS-CoV-2 by FDA under an Emergency Use Authorization (EUA). This EUA will remain in effect (meaning this test can be used) for the duration of the COVID-19 declaration under Section 564(b)(1) of the Act, 21 U.S.C. section 360bbb-3(b)(1), unless the authorization is terminated or revoked.  Performed at [facility], [place] [address] [place], [place] [address]   Culture, blood (routine x 2)     Status: None (Preliminary result)   Collection Time: 06/30/22  7:52 PM   Specimen: BLOOD  Result Value Ref Range Status   Specimen Description BLOOD LEFT ANTECUBITAL  Final   Special Requests   Final    BOTTLES DRAWN AEROBIC AND ANAEROBIC Blood Culture adequate volume   Culture   Final    NO GROWTH 4 DAYS Performed at [facility], [place] [address], [place], [place] [address]    Report Status PENDING  Incomplete  Culture, blood (routine x 2)     Status: None (Preliminary result)   Collection Time: 06/30/22  8:28 PM   Specimen: BLOOD LEFT HAND  Result Value Ref Range Status   Specimen Description BLOOD LEFT HAND  Final   Special Requests   Final    BOTTLES DRAWN AEROBIC AND ANAEROBIC Blood Culture adequate volume   Culture   Final    NO GROWTH 4 DAYS Performed at [facility], [place] [address] [place], [place] [address]    Report Status PENDING  Incomplete  MRSA Next Gen by PCR, Nasal     Status: None   Collection Time: 06/30/22 11:21 PM  Result Value Ref Range Status   MRSA by PCR Next Gen NOT DETECTED NOT DETECTED Final    Comment: (NOTE) The GeneXpert MRSA Assay (FDA approved for NASAL specimens  only), is one component of a comprehensive MRSA colonization surveillance program. It is not intended to diagnose MRSA infection nor to guide or monitor treatment for MRSA infections. Test performance is not FDA approved in patients less than 23 years old. Performed at [facility], [place] [address], [place], [place] [address]   CSF culture w Gram Stain     Status: None (Preliminary result)   Collection Time: 07/02/22  4:46 PM   Specimen: CSF; Cerebrospinal Fluid  Result Value Ref Range Status   Specimen Description CSF  Final   Special Requests LP  Final   Gram Stain NO WBC SEEN NO ORGANISMS SEEN CYTOSPIN SMEAR   Final   Culture   Final    NO GROWTH 2 DAYS Performed at [facility], [place] [address] [place], [place] [address]    Report Status PENDING  Incomplete  Respiratory (~20 pathogens) panel by PCR     Status: None   Collection Time: 07/02/22  6:00 PM   Specimen: Nasopharyngeal Swab; Respiratory  Result Value Ref Range Status   Adenovirus NOT DETECTED NOT DETECTED Final   Coronavirus 229E NOT DETECTED NOT DETECTED Final    Comment: (NOTE) The  Coronavirus on the Respiratory Panel, DOES NOT test for the novel  Coronavirus (2019 nCoV)    Coronavirus HKU1 NOT DETECTED NOT DETECTED Final   Coronavirus NL63 NOT DETECTED NOT DETECTED Final   Coronavirus OC43 NOT DETECTED NOT DETECTED Final   Metapneumovirus NOT DETECTED NOT DETECTED Final   Rhinovirus / Enterovirus NOT DETECTED NOT DETECTED Final   Influenza A NOT DETECTED NOT DETECTED Final   Influenza B NOT DETECTED NOT DETECTED Final   Parainfluenza Virus 1 NOT DETECTED NOT DETECTED Final   Parainfluenza Virus 2 NOT DETECTED NOT DETECTED Final   Parainfluenza Virus 3 NOT DETECTED NOT DETECTED Final   Parainfluenza Virus 4 NOT DETECTED NOT DETECTED Final   Respiratory Syncytial Virus NOT DETECTED NOT DETECTED Final   Bordetella pertussis NOT DETECTED NOT DETECTED Final   Bordetella Parapertussis NOT DETECTED  NOT DETECTED Final   Chlamydophila pneumoniae NOT DETECTED NOT DETECTED Final   Mycoplasma pneumoniae NOT DETECTED NOT DETECTED Final    Comment: Performed at [facility], [place] [address] [place], [place] [address]    Radiology Studies: No results found.    Elek Holderness T. Felton  If 7PM-7AM, please contact night-coverage [email or web address] 07/04/2022, 2:14 PM

## 2022-07-05 ENCOUNTER — Inpatient Hospital Stay (HOSPITAL_COMMUNITY): Payer: Medicare Other

## 2022-07-05 DIAGNOSIS — G9341 Metabolic encephalopathy: Secondary | ICD-10-CM | POA: Diagnosis not present

## 2022-07-05 DIAGNOSIS — N19 Unspecified kidney failure: Secondary | ICD-10-CM | POA: Diagnosis not present

## 2022-07-05 DIAGNOSIS — I5032 Chronic diastolic (congestive) heart failure: Secondary | ICD-10-CM | POA: Diagnosis not present

## 2022-07-05 DIAGNOSIS — K5900 Constipation, unspecified: Secondary | ICD-10-CM | POA: Diagnosis not present

## 2022-07-05 LAB — CBC
HCT: 42.4 % (ref 36.0–46.0)
Hemoglobin: 13 g/dL (ref 12.0–15.0)
MCH: 27 pg (ref 26.0–34.0)
MCHC: 30.7 g/dL (ref 30.0–36.0)
MCV: 88 fL (ref 80.0–100.0)
Platelets: 88 10*3/uL — ABNORMAL LOW (ref 150–400)
RBC: 4.82 MIL/uL (ref 3.87–5.11)
RDW: 15.5 % (ref 11.5–15.5)
WBC: 7.1 10*3/uL (ref 4.0–10.5)
nRBC: 0.3 % — ABNORMAL HIGH (ref 0.0–0.2)

## 2022-07-05 LAB — GLUCOSE, CAPILLARY
Glucose-Capillary: 127 mg/dL — ABNORMAL HIGH (ref 70–99)
Glucose-Capillary: 148 mg/dL — ABNORMAL HIGH (ref 70–99)

## 2022-07-05 LAB — COMPREHENSIVE METABOLIC PANEL
ALT: 45 U/L — ABNORMAL HIGH (ref 0–44)
AST: 68 U/L — ABNORMAL HIGH (ref 15–41)
Albumin: 1.7 g/dL — ABNORMAL LOW (ref 3.5–5.0)
Alkaline Phosphatase: 63 U/L (ref 38–126)
Anion gap: 5 (ref 5–15)
BUN: 12 mg/dL (ref 8–23)
CO2: 30 mmol/L (ref 22–32)
Calcium: 9.1 mg/dL (ref 8.9–10.3)
Chloride: 108 mmol/L (ref 98–111)
Creatinine, Ser: 0.83 mg/dL (ref 0.44–1.00)
GFR, Estimated: >60 mL/min (ref >60)
Glucose, Bld: 118 mg/dL — ABNORMAL HIGH (ref 70–99)
Potassium: 3.3 mmol/L — ABNORMAL LOW (ref 3.5–5.1)
Sodium: 143 mmol/L (ref 135–145)
Total Bilirubin: 0.7 mg/dL (ref 0.3–1.2)
Total Protein: 5.5 g/dL — ABNORMAL LOW (ref 6.5–8.1)

## 2022-07-05 LAB — MAGNESIUM: Magnesium: 2.1 mg/dL (ref 1.7–2.4)

## 2022-07-05 LAB — CULTURE, BLOOD (ROUTINE X 2)
Culture: NO GROWTH
Culture: NO GROWTH
Special Requests: ADEQUATE
Special Requests: ADEQUATE

## 2022-07-05 LAB — PHOSPHORUS: Phosphorus: 3.1 mg/dL (ref 2.5–4.6)

## 2022-07-05 LAB — MISC LABCORP TEST (SEND OUT): Labcorp test code: 828733

## 2022-07-05 MED ORDER — KCL IN DEXTROSE-NACL 30-5-0.45 MEQ/L-%-% IV SOLN
INTRAVENOUS | Status: DC
  Filled 2022-07-05: qty 1000

## 2022-07-05 MED ORDER — FLEET ENEMA 7-19 GM/118ML RE ENEM
1.0000 | ENEMA | Freq: Once | RECTAL | Status: AC
  Administered 2022-07-05: 1 via RECTAL

## 2022-07-05 MED ORDER — POTASSIUM CHLORIDE 2 MEQ/ML IV SOLN
INTRAVENOUS | Status: DC
  Filled 2022-07-05 (×9): qty 1000

## 2022-07-05 NOTE — Progress Notes (Addendum)
[facility] Gastroenterology Progress Note  Barbara Nelson 82 y.o. 06-05-1940  CC:  encephalopathy, fecal impaction   Subjective: Patient seen and examined at bedside.  Found daughter at bedside.  Patient is more alert today.  Tolerating clear liquid diet.  Had multiple loose stools after enemas yesterday.  ROS : afebrile, negative for chest pain   Objective: Vital signs in last 24 hours: Vitals:   07/05/22 0800 07/05/22 0833  BP: 101/74   Pulse: 81   Resp:    Temp:    SpO2: 94% 99%    Physical Exam:  Elderly patient, not in acute distress.  More alert today.  Abdominal exam benign.  Bowel Sounds present.  No peritoneal signs.  Lab Results: Recent Labs    07/04/22 0810 07/05/22 0433  NA 144 143  K 3.5 3.3*  CL 111 108  CO2 28 30  GLUCOSE 160* 118*  BUN 14 12  CREATININE 0.76 0.83  CALCIUM 9.2 9.1  MG 2.1 2.1  PHOS 2.0* 3.1   Recent Labs    07/04/22 0810 07/05/22 0433  AST 62* 68*  ALT 40 45*  ALKPHOS 67 63  BILITOT 0.9 0.7  PROT 5.7* 5.5*  ALBUMIN 1.7* 1.7*   Recent Labs    07/03/22 0742 07/04/22 0810 07/05/22 0433  WBC 6.4 5.9 7.1  NEUTROABS 3.7  --   --   HGB 13.2 13.2 13.0  HCT 41.3 41.4 42.4  MCV 87.7 86.8 88.0  PLT 132* 101* 88*   No results for input(s): "LABPROT", "INR" in the last 72 hours.    Assessment/Plan: -Constipation and fecal impaction seen on the CT scan few days ago.  She is not having regular bowel movements according to family members.  Improving. -Nausea and vomiting.  Improving. -Encephalopathy unknown etiology.  Neurology following.  Currently n.p.o. status per speech recommendations. -Elevated LFTs.-likely transient.  CT and ultrasound showed normal-appearing liver.   Recommendations -------------------------- -Multiple loose stools yesterday after fleets enema.  Abdominal x-ray today showed persistent fecal stool ball.  Recommend additional  fleets enema today.  Diet advancement per speech therapy.  GI will follow.      Otis Brace MD, [place] 07/05/2022, 10:18 AM  Contact #  [phone]

## 2022-07-05 NOTE — Progress Notes (Unsigned)
Office Visit    Patient Name: Barbara Nelson Date of Encounter: 07/05/2022  PCP:  Cari Caraway, [facility]  Cardiologist:  Fransico Him, MD  Advanced Practice Provider:  No care team member to display Electrophysiologist:  None   HPI    Barbara Nelson is a 82 y.o. female with a history of viral pericarditis with tamponade status post pericardial window and anterior partial pericardiectomy in 2013 with postop atrial fibrillation, chronic LBBB, hypertension, pulmonary nodule presents today for follow-up appointment.  She was last seen 11/14/2021 for follow-up.  She denies any chest pain or pressure.  No SOB or DOE.  Occasionally had some mild left ankle edema.  Compliant with medications.  Today, she***  Past Medical History    Past Medical History:  Diagnosis Date   Benign essential HTN 10/31/2015   Cardiac tamponade due to viral pericarditis    LBBB (left bundle branch block)    Lung mass    fungal infection s/p resection at [facility]   Postoperative atrial fibrillation [facility])    Pulmonary nodule    Past Surgical History:  Procedure Laterality Date   ABDOMINAL HYSTERECTOMY  1988   chest tube placement  09/03/11   LUNG REMOVAL, PARTIAL     right knee arthoscopy with meniscal repair     subxiphoid pericardial window and drainage anterior partial pericardiectomy  09/03/11    Allergies  Allergies  Allergen Reactions   Latex Rash   Penicillins Rash and Itching   Tape Hives and Rash   Z-Pak [Azithromycin] Rash and Other (See Comments)    Mouth sores   Eggs Or Egg-Derived Products Nausea And Vomiting   Amoxil [Amoxicillin] Rash   Sulfa Antibiotics Rash    EKGs/Labs/Other Studies Reviewed:   The following studies were reviewed today: none  EKG:  EKG is *** ordered today.  The ekg ordered today demonstrates ***  Recent Labs: 06/30/2022: B Natriuretic Peptide 28.2; TSH 1.727 07/05/2022: ALT 45; BUN 12; Creatinine, Ser 0.83; Hemoglobin  13.0; Magnesium 2.1; Platelets 88; Potassium 3.3; Sodium 143  Recent Lipid Panel No results found for: "CHOL", "TRIG", "HDL", "CHOLHDL", "VLDL", "LDLCALC", "LDLDIRECT"  Risk Assessment/Calculations:  {Does this patient have ATRIAL FIBRILLATION?:[phone]}  Home Medications   No outpatient medications have been marked as taking for the 07/06/22 encounter (Appointment) with Elgie Collard, PA-C.     Review of Systems   ***   All other systems reviewed and are otherwise negative except as noted above.  Physical Exam    VS:  LMP  (LMP Unknown)  , BMI There is no height or weight on file to calculate BMI.  Wt Readings from Last 3 Encounters:  07/05/22 198 lb 6.6 oz (90 kg)  06/18/22 217 lb (98.4 kg)  05/12/22 221 lb (100.2 kg)     GEN: Well nourished, well developed, in no acute distress. HEENT: normal. Neck: Supple, no JVD, carotid bruits, or masses. Cardiac: ***RRR, no murmurs, rubs, or gallops. No clubbing, cyanosis, edema.  ***Radials/PT 2+ and equal bilaterally.  Respiratory:  ***Respirations regular and unlabored, clear to auscultation bilaterally. GI: Soft, nontender, nondistended. MS: No deformity or atrophy. Skin: Warm and dry, no rash. Neuro:  Strength and sensation are intact. Psych: Normal affect.  Assessment & Plan    History of viral pericarditis Postoperative atrial fibrillation LBBB Hypertension Edema of extremities (chronic)         Disposition: Follow up {follow up:[id number]} with Fransico Him, MD or APP.  Signed, Elgie Collard, PA-C 07/05/2022, 6:26 PM [facility]

## 2022-07-05 NOTE — Progress Notes (Signed)
Subjective: Much improved  Exam: Vitals:   07/05/22 0800 07/05/22 0833  BP: 101/74   Pulse: 81   Resp: 18   Temp: 98.1 F (36.7 C)   SpO2: 94% 99%   Gen: In bed, NAD Resp: non-labored breathing, no acute distress Abd: soft, nt  Neuro: MS: She is markedly improved. Much more awake, alert, she is able to name family members, much more conversant. Tells me the month, but not year.  CN: VFF, EOMI Motor: She is able to hold both arms without drift, R leg limited by hip pain.  Sensory: Endorses symmetric sensation  Pertinent Labs: B12 1172 ABG 7.4 8/45/116 UDS negative UA is negative BUN 17 Ammonia 15 TSH 1.727 B1-pending  Impression: 82 year old female with what I suspect to be a prolonged multifactorial hypoactive delirium that appears to be improving. At this point, I would expect her to continue to gradually improve.  She seems to be reestablishing her day night cycle and I suspect that is the reason for her improvement today.  If she were to continue to have agitation at night, I would add Seroquel.  If she continues to have significant problems with lethargy, could consider low-dose stimulant but I think that with her improvement today I would not favor doing that at this time.   Recommendations: 1) can d/c seroquel since she has not gotten it and is improving.  2) continue opening shades during day, minimizing stimulation at night 3) continue bowel regimen, encourage motility.   4) avoid deliriogenic medications 5) agree with thiamine supplementation 6) neurology will be available as needed.  If she does not continue improving as expected, please call with further questions or concerns.  Roland Rack, MD [facility] [phone]  If 7pm- 7am, please page neurology on call as listed in [place].

## 2022-07-05 NOTE — Progress Notes (Signed)
PROGRESS NOTE  Barbara Nelson JGG:[id number] DOB: Jul 14, 1940   PCP: Cari Caraway, MD  Patient is from: SNF  DOA: 06/30/2022 LOS: 5  Chief complaints Chief Complaint  Patient presents with   Fatigue   Abdominal Pain   Emesis     Brief Narrative / Interim history: 82 year old F with PMH of diastolic CHF, chronic hypoxic RF on 2 L, HTN, and recent hospitalization from 2/1-10 with CAP and acute on chronic CHF returning with altered mental status, lethargy and vomiting, and admitted with working diagnosis of HCAP and encephalopathy.  Family denies history of dementia or cognitive impairment.  They significant decline in cognition since she was diagnosed with shingles in January and started on gabapentin.  She was weaned off gabapentin but she continued to decline, even worse after her recent hospitalization.  She is currently not on any sedating medication.  In ED, slightly hypertensive.  Not febrile.  Saturating in upper 90s to 100 on 2 L.  No leukocytosis.  Hgb 15.2 (12.4 about 12 days ago).  Slightly elevated LFT and Cr.  Lipase, BNP, ammonia, TSH, CK and procalcitonin within normal.  Lactic acid 2.1.  VBG suggess mild metabolic alkalosis.  UDS negative.  UA with > 300 protein.  XR with minimal strandy and hazy opacity at both lung bases suggesting atelectasis or infection, small left pleural effusion and stable cardiomegaly.  CT abdomen and pelvis with contrast showed large amount of stool in the rectum concerning for impaction.  She had disimpaction in ED.  CT head without acute finding.  MRI brain without acute finding but limited by motion.  Patient was started on IVF, IV cefepime and vancomycin and admitted with working diagnosis of HCAP, encephalopathy and dehydration.  The next day, pneumonia or infection felt to be less likely.  Blood cultures negative.  Antibiotic discontinued.  Further encephalopathy workup including ABG, UDS and B12 unrevealing.  Neurology consulted and she had  lumbar puncture.  CSF studies unrevealing so far.  EEG suggest moderate diffuse encephalopathy but no seizure or epileptiform discharge.  Started on high-dose thiamine without significant improvement.  Family requested transfer to [facility] but both at full capacity.  Fortunately, encephalopathy improved.  Neurology and GI following.   Subjective: Seen and examined earlier this morning.  No major events overnight of this morning.  She is awake and alert today.  She is oriented to self and family members but not place or time.  She follows commands.  She denies pain or shortness of breath.  Family at bedside.  Objective: Vitals:   07/05/22 0640 07/05/22 0650 07/05/22 0800 07/05/22 0833  BP:  102/72 101/74   Pulse: 75 75 81   Resp:   18   Temp:   98.1 F (36.7 C)   TempSrc:   Oral   SpO2: 97% 98% 94% 99%  Weight:        Examination:  GENERAL: No apparent distress.  Nontoxic. HEENT: MMM.  Vision and hearing grossly intact.  NECK: Supple.  No apparent JVD.  RESP:  No IWOB.  Fair aeration bilaterally. CVS:  RRR. Heart sounds normal.  ABD/GI/GU: BS+. Abd soft, NTND.  MSK/EXT:   No apparent deformity. Moves extremities but globally weak. No edema.  SKIN: no apparent skin lesion or wound NEURO: Awake and alert.  Oriented to self and family.  Follows commands.  No apparent focal neuro deficit. PSYCH: Calm. Normal affect.   Procedures:  2/15-lumbar puncture.  Microbiology summarized: [id number], influenza and RSV PCR  nonreactive MRSA PCR screen nonreactive Blood cultures NGTD CSF cultures NGTD.  Assessment and plan: Principal Problem:   Acute metabolic encephalopathy Active Problems:   Acute hypoxic respiratory failure (HCC)   Essential hypertension   Hypercalcemia   Dehydration   Acute prerenal azotemia   Transaminitis   Constipation   DM2 (diabetes mellitus, type 2) (HCC)   Allergic rhinitis   Chronic diastolic CHF (congestive heart failure) (HCC)  Acute metabolic  encephalopathy: Initially felt to be dehydration and delirium but mental status worse despite hydration.  Encephalopathy workup including CT head, MRI brain, ammonia, TSH, VBG, UDS, B12, RPR, CSF study and EEG unrevealing.  Delirium remains on differential.  I do not know if this could be catatonia.  Nutrition could play a role.  Fortunately, encephalopathy improved.  Now awake and alert and oriented to self and family. -Reorientation and delirium precaution -Continue high-dose thiamine -Resisted NGT placement on 2/16. Family concerned that she may require restraints if placed -SLP recommended clear liquid diet for now. Will ask to reassess now she awake and alert -Fall and aspiration precaution. -PT/OT/SLP/RD eval. -[facility] called for transfer but at capacity. -Discontinued Seroquel after discussion with neuro.  She has not taken yet -Appreciate help by neurology  Dehydration: Likely due to poor intake and vomiting.  Seems to have resolved with IV hydration. -Change D5 to D5-1/2NS-KCl  Vomiting/constipation/possible fecal impaction:  CT without acute finding other than fecal impaction.  RUQ [place] negative. S/p disimpaction in ED.  Reportedly had multiple loose stools yesterday after Fleet enema. -Appreciate input by GI-to Fleet enemas again today. -Continue bowel regimen -Antiemetics as needed -Continue IV fluid  Elevated LFT:  CK within normal.  LFT improved.  RUQ [place] negative.  Stable. -Continue monitoring  Mild hyponatremia/hypokalemia -D5-1/2NS-KCl at 100 cc an hour.  Chronic diastolic CHF: TTE on 2/2 with LVEF of 50 to 55% and G1-DD.  She was somewhat dehydrated on presentation.  She seems to be on Lasix.  No CHF on chest x-ray. -Closely monitor fluid and respiratory status while on IV fluid -Continue holding Lasix -Strict intake and output, daily weights, renal functions and electrolytes  Chronic hypoxic respiratory failure: On 2 L at baseline.  Stable. -Minimum oxygen to keep  saturation above 90%. -Try incentive spirometry and pulmonary toilet  Controlled IDDM-2 with hyperglycemia: A1c 6.9%. -Continue current insulin regimen  Hypercalcemia: Likely secondary due to dehydration.  Resolved with hydration.  Hypokalemia -Monitor replenish as appropriate  Generalized weakness/lethargy: In the setting of encephalopathy as above. -PT/OT eval as above  HCAP/pneumonia ruled out: No fever, leukocytosis.  Procalcitonin within normal.  COVID-19, influenza and RSV PCR nonreactive.  MRSA PCR negative.  Full RVP negative.  Blood cultures NGTD.  Cough likely due to silent aspiration in the setting of encephalopathy and vomiting. -Appreciate input by pulmonology. -Continue aspiration precaution and pulmonary toilet.  Obesity/decreased oral intake: Decreased oral intake likely due to encephalopathy.  Prealbumin 11.  Resistant NG tube insertion on 2/16.  Now encephalopathy resolved, would be able to advance diet after SLP eval. Body mass index is 33.02 kg/m. Nutrition Problem: Inadequate oral intake Etiology: lethargy/confusion, dysphagia Signs/Symptoms: NPO status (pocketing/holding food) Interventions: Refer to RD note for recommendations, Ensure Enlive (each supplement provides 350kcal and 20 grams of protein)   DVT prophylaxis:  Place and maintain sequential compression device Start: 07/05/22 0743 SCDs Start: 06/30/22 2019  Code Status: Full code Family Communication: Updated patient's daughter and son-in-law at bedside Level of care: Med-Surg Status is: Inpatient  Remains inpatient appropriate because: Acute metabolic encephalopathy, dehydration and lethargy.   Final disposition: To be determined. Consultants:  Neurology Pulmonology Gastroenterology  55 minutes with more than 50% spent in reviewing records, counseling patient/family and coordinating care.   Sch Meds:  Scheduled Meds:  bisacodyl  10 mg Rectal Daily   fluconazole  150 mg Oral Once    fluticasone  2 spray Each Nare q morning   insulin aspart  0-6 Units Subcutaneous TID WC   magic mouthwash w/lidocaine  5 mL Oral QID   metoprolol tartrate  5 mg Intravenous Q8H   nystatin  5 mL Oral QID   polyethylene glycol  17 g Oral BID   QUEtiapine  25 mg Oral QHS   sodium chloride HYPERTONIC  4 mL Nebulization BID   [START ON 07/10/2022] thiamine (VITAMIN B1) injection  100 mg Intravenous Daily   Continuous Infusions:  dextrose 5 % and 0.45% NaCl 1,000 mL with potassium chloride 30 mEq infusion 100 mL/hr at 07/05/22 0912   thiamine (VITAMIN B1) injection 250 mg (07/05/22 0914)   PRN Meds:.acetaminophen **OR** acetaminophen, gadobutrol, guaiFENesin, ipratropium-albuterol, menthol-cetylpyridinium, milk and molasses, ondansetron (ZOFRAN) IV  Antimicrobials: Anti-infectives (From admission, onward)    Start     Dose/Rate Route Frequency Ordered Stop   07/04/22 1730  fluconazole (DIFLUCAN) tablet 150 mg        150 mg Oral  Once 07/04/22 1633     07/01/22 2200  vancomycin (VANCOCIN) IVPB 1000 mg/200 mL premix  Status:  Discontinued        1,000 mg 200 mL/hr over 60 Minutes Intravenous Every 24 hours 06/30/22 2136 07/01/22 1047   07/01/22 0800  ceFEPIme (MAXIPIME) 2 g in sodium chloride 0.9 % 100 mL IVPB  Status:  Discontinued        2 g 200 mL/hr over 30 Minutes Intravenous Every 8 hours 07/01/22 0735 07/01/22 1047   06/30/22 2145  ceFEPIme (MAXIPIME) 2 g in sodium chloride 0.9 % 100 mL IVPB  Status:  Discontinued        2 g 200 mL/hr over 30 Minutes Intravenous Every 12 hours 06/30/22 2136 07/01/22 0735   06/30/22 2145  vancomycin (VANCOREADY) IVPB 2000 mg/400 mL        2,000 mg 200 mL/hr over 120 Minutes Intravenous  Once 06/30/22 2136 07/01/22 0204        I have personally reviewed the following labs and images: CBC: Recent Labs  Lab 06/30/22 1406 06/30/22 2000 07/01/22 0420 07/01/22 0830 07/02/22 0834 07/03/22 0742 07/04/22 0810 07/05/22 0433  WBC 7.2  --  9.3   --  8.0 6.4 5.9 7.1  NEUTROABS 4.7  --  6.1  --  5.3 3.7  --   --   HGB 15.2*   < > 13.1 13.9 13.6 13.2 13.2 13.0  HCT 46.8*   < > 40.5 41.0 42.8 41.3 41.4 42.4  MCV 87.5  --  87.1  --  86.1 87.7 86.8 88.0  PLT 192  --  223  --  172 132* 101* 88*   < > = values in this interval not displayed.   BMP &GFR Recent Labs  Lab 07/01/22 0420 07/01/22 0830 07/02/22 0834 07/03/22 0742 07/04/22 0810 07/05/22 0433  NA 138 144 144 148* 144 143  K 4.0 3.7 3.4* 3.6 3.5 3.3*  CL 101  --  109 111 111 108  CO2 29  --  27 29 28 30   GLUCOSE 136*  --  129* 114* 160*  118*  BUN 19  --  17 17 14 12   CREATININE 0.81  --  0.79 0.82 0.76 0.83  CALCIUM 9.6  --  9.9 9.8 9.2 9.1  MG 2.3  --  2.2 2.3 2.1 2.1  PHOS 2.7  --  2.3* 2.5 2.0* 3.1   Estimated Creatinine Clearance: 57.9 mL/min (by C-G formula based on SCr of 0.83 mg/dL). Liver & Pancreas: Recent Labs  Lab 07/01/22 0420 07/02/22 0834 07/03/22 0742 07/04/22 0810 07/05/22 0433  AST 72* 55* 58* 62* 68*  ALT 45* 42 40 40 45*  ALKPHOS 67 67 64 67 63  BILITOT 1.4* 1.4* 1.1 0.9 0.7  PROT 6.8 6.7 6.0* 5.7* 5.5*  ALBUMIN 2.0* 2.1* 1.9* 1.7* 1.7*   Recent Labs  Lab 06/30/22 1406  LIPASE 24   Recent Labs  Lab 06/30/22 1952  AMMONIA 15   Diabetic: No results for input(s): "HGBA1C" in the last 72 hours. Recent Labs  Lab 07/04/22 1234 07/04/22 1558 07/04/22 2320 07/05/22 0851 07/05/22 1405  GLUCAP 152* 113* 127* 127* 148*   Cardiac Enzymes: Recent Labs  Lab 06/30/22 2015  CKTOTAL 21*   No results for input(s): "PROBNP" in the last 8760 hours. Coagulation Profile: Recent Labs  Lab 07/01/22 0505  INR 1.0   Thyroid Function Tests: No results for input(s): "TSH", "T4TOTAL", "FREET4", "T3FREE", "THYROIDAB" in the last 72 hours.  Lipid Profile: No results for input(s): "CHOL", "HDL", "LDLCALC", "TRIG", "CHOLHDL", "LDLDIRECT" in the last 72 hours. Anemia Panel: No results for input(s): "VITAMINB12", "FOLATE", "FERRITIN",  "TIBC", "IRON", "RETICCTPCT" in the last 72 hours.  Urine analysis:    Component Value Date/Time   COLORURINE YELLOW 07/01/2022 0610   APPEARANCEUR CLEAR 07/01/2022 0610   LABSPEC 1.038 (H) 07/01/2022 0610   PHURINE 5.0 07/01/2022 0610   GLUCOSEU NEGATIVE 07/01/2022 0610   HGBUR NEGATIVE 07/01/2022 0610   BILIRUBINUR NEGATIVE 07/01/2022 0610   KETONESUR 5 (A) 07/01/2022 0610   PROTEINUR >=300 (A) 07/01/2022 0610   UROBILINOGEN 0.2 09/15/2011 0530   NITRITE NEGATIVE 07/01/2022 0610   LEUKOCYTESUR NEGATIVE 07/01/2022 0610   Sepsis Labs: Invalid input(s): "PROCALCITONIN", "LACTICIDVEN"  Microbiology: Recent Results (from the past 240 hour(s))  Resp panel by RT-PCR (RSV, Flu A&B, Covid) Anterior Nasal Swab     Status: None   Collection Time: 06/30/22  1:31 PM   Specimen: Anterior Nasal Swab  Result Value Ref Range Status   SARS Coronavirus 2 by RT PCR NEGATIVE NEGATIVE Final   Influenza A by PCR NEGATIVE NEGATIVE Final   Influenza B by PCR NEGATIVE NEGATIVE Final    Comment: (NOTE) The Xpert Xpress SARS-CoV-2/FLU/RSV plus assay is intended as an aid in the diagnosis of influenza from Nasopharyngeal swab specimens and should not be used as a sole basis for treatment. Nasal washings and aspirates are unacceptable for Xpert Xpress SARS-CoV-2/FLU/RSV testing.  Fact Sheet for Patients: EntrepreneurPulse.com.au  Fact Sheet for Healthcare Providers: IncredibleEmployment.be  This test is not yet approved or cleared by the [place] FDA and has been authorized for detection and/or diagnosis of SARS-CoV-2 by FDA under an Emergency Use Authorization (EUA). This EUA will remain in effect (meaning this test can be used) for the duration of the COVID-19 declaration under Section 564(b)(1) of the Act, 21 U.S.C. section 360bbb-3(b)(1), unless the authorization is terminated or revoked.     Resp Syncytial Virus by PCR NEGATIVE NEGATIVE Final     Comment: (NOTE) Fact Sheet for Patients: EntrepreneurPulse.com.au  Fact Sheet for Healthcare Providers: IncredibleEmployment.be  This test is not yet approved or cleared by the [place] and has been authorized for detection and/or diagnosis of SARS-CoV-2 by FDA under an Emergency Use Authorization (EUA). This EUA will remain in effect (meaning this test can be used) for the duration of the COVID-19 declaration under Section 564(b)(1) of the Act, 21 U.S.C. section 360bbb-3(b)(1), unless the authorization is terminated or revoked.  Performed at [facility], [place] [address] [place], [place] [address]   Culture, blood (routine x 2)     Status: None   Collection Time: 06/30/22  7:52 PM   Specimen: BLOOD  Result Value Ref Range Status   Specimen Description BLOOD LEFT ANTECUBITAL  Final   Special Requests   Final    BOTTLES DRAWN AEROBIC AND ANAEROBIC Blood Culture adequate volume   Culture   Final    NO GROWTH 5 DAYS Performed at [facility], [place] [address], [place], [place] [address]    Report Status 07/05/2022 FINAL  Final  Culture, blood (routine x 2)     Status: None   Collection Time: 06/30/22  8:28 PM   Specimen: BLOOD LEFT HAND  Result Value Ref Range Status   Specimen Description BLOOD LEFT HAND  Final   Special Requests   Final    BOTTLES DRAWN AEROBIC AND ANAEROBIC Blood Culture adequate volume   Culture   Final    NO GROWTH 5 DAYS Performed at [facility], [place] [address], [place], [place] [address]    Report Status 07/05/2022 FINAL  Final  MRSA Next Gen by PCR, Nasal     Status: None   Collection Time: 06/30/22 11:21 PM  Result Value Ref Range Status   MRSA by PCR Next Gen NOT DETECTED NOT DETECTED Final    Comment: (NOTE) The GeneXpert MRSA Assay (FDA approved for NASAL specimens only), is one component of a comprehensive MRSA colonization surveillance program. It is not intended to diagnose MRSA  infection nor to guide or monitor treatment for MRSA infections. Test performance is not FDA approved in patients less than 57 years old. Performed at [facility], [place] [address], [place], [place] [address]   CSF culture w Gram Stain     Status: None (Preliminary result)   Collection Time: 07/02/22  4:46 PM   Specimen: CSF; Cerebrospinal Fluid  Result Value Ref Range Status   Specimen Description CSF  Final   Special Requests LP  Final   Gram Stain NO WBC SEEN NO ORGANISMS SEEN CYTOSPIN SMEAR   Final   Culture   Final    NO GROWTH 3 DAYS Performed at [facility], [place] [address], [place], [place] [address]    Report Status PENDING  Incomplete  Respiratory (~20 pathogens) panel by PCR     Status: None   Collection Time: 07/02/22  6:00 PM   Specimen: Nasopharyngeal Swab; Respiratory  Result Value Ref Range Status   Adenovirus NOT DETECTED NOT DETECTED Final   Coronavirus 229E NOT DETECTED NOT DETECTED Final    Comment: (NOTE) The Coronavirus on the Respiratory Panel, DOES NOT test for the novel  Coronavirus (2019 nCoV)    Coronavirus HKU1 NOT DETECTED NOT DETECTED Final   Coronavirus NL63 NOT DETECTED NOT DETECTED Final   Coronavirus OC43 NOT DETECTED NOT DETECTED Final   Metapneumovirus NOT DETECTED NOT DETECTED Final   Rhinovirus / Enterovirus NOT DETECTED NOT DETECTED Final   Influenza A NOT DETECTED NOT DETECTED  Final   Influenza B NOT DETECTED NOT DETECTED Final   Parainfluenza Virus 1 NOT DETECTED NOT DETECTED Final   Parainfluenza Virus 2 NOT DETECTED NOT DETECTED Final   Parainfluenza Virus 3 NOT DETECTED NOT DETECTED Final   Parainfluenza Virus 4 NOT DETECTED NOT DETECTED Final   Respiratory Syncytial Virus NOT DETECTED NOT DETECTED Final   Bordetella pertussis NOT DETECTED NOT DETECTED Final   Bordetella Parapertussis NOT DETECTED NOT DETECTED Final   Chlamydophila pneumoniae NOT DETECTED NOT DETECTED Final   Mycoplasma pneumoniae NOT DETECTED NOT  DETECTED Final    Comment: Performed at [facility], [place] [address], [place], [place] [address]    Radiology Studies: DG Abd Portable 2V  Result Date: 07/05/2022 CLINICAL DATA:  Fecal impaction. EXAM: PORTABLE ABDOMEN - 2 VIEW COMPARISON:  CT abdomen and pelvis 06/30/2022. FINDINGS: The bowel gas pattern is normal. There is no evidence of free air. A large stool ball is present in the rectum. No radio-opaque calculi or other significant radiographic abnormality is seen. IMPRESSION: No change in a large stool ball in the rectum. Electronically Signed   By: Inge Rise M.D.   On: 07/05/2022 09:12      Delina Kruczek T. Sherman  If 7PM-7AM, please contact night-coverage [email or web address] 07/05/2022, 3:57 PM

## 2022-07-06 ENCOUNTER — Ambulatory Visit: Payer: Medicare Other | Attending: Physician Assistant | Admitting: Physician Assistant

## 2022-07-06 DIAGNOSIS — I5032 Chronic diastolic (congestive) heart failure: Secondary | ICD-10-CM | POA: Diagnosis not present

## 2022-07-06 DIAGNOSIS — I10 Essential (primary) hypertension: Secondary | ICD-10-CM

## 2022-07-06 DIAGNOSIS — I4891 Unspecified atrial fibrillation: Secondary | ICD-10-CM

## 2022-07-06 DIAGNOSIS — G9341 Metabolic encephalopathy: Secondary | ICD-10-CM | POA: Diagnosis not present

## 2022-07-06 DIAGNOSIS — I447 Left bundle-branch block, unspecified: Secondary | ICD-10-CM

## 2022-07-06 DIAGNOSIS — K5900 Constipation, unspecified: Secondary | ICD-10-CM | POA: Diagnosis not present

## 2022-07-06 DIAGNOSIS — N19 Unspecified kidney failure: Secondary | ICD-10-CM | POA: Diagnosis not present

## 2022-07-06 DIAGNOSIS — B3323 Viral pericarditis: Secondary | ICD-10-CM

## 2022-07-06 DIAGNOSIS — R600 Localized edema: Secondary | ICD-10-CM

## 2022-07-06 LAB — COMPREHENSIVE METABOLIC PANEL
ALT: 45 U/L — ABNORMAL HIGH (ref 0–44)
AST: 70 U/L — ABNORMAL HIGH (ref 15–41)
Albumin: <1.5 g/dL — ABNORMAL LOW (ref 3.5–5.0)
Alkaline Phosphatase: 62 U/L (ref 38–126)
Anion gap: 5 (ref 5–15)
BUN: 11 mg/dL (ref 8–23)
CO2: 27 mmol/L (ref 22–32)
Calcium: 8.5 mg/dL — ABNORMAL LOW (ref 8.9–10.3)
Chloride: 109 mmol/L (ref 98–111)
Creatinine, Ser: 0.75 mg/dL (ref 0.44–1.00)
GFR, Estimated: >60 mL/min (ref >60)
Glucose, Bld: 120 mg/dL — ABNORMAL HIGH (ref 70–99)
Potassium: 3.6 mmol/L (ref 3.5–5.1)
Sodium: 141 mmol/L (ref 135–145)
Total Bilirubin: 0.5 mg/dL (ref 0.3–1.2)
Total Protein: 5 g/dL — ABNORMAL LOW (ref 6.5–8.1)

## 2022-07-06 LAB — CBC
HCT: 40.1 % (ref 36.0–46.0)
Hemoglobin: 12.3 g/dL (ref 12.0–15.0)
MCH: 27.5 pg (ref 26.0–34.0)
MCHC: 30.7 g/dL (ref 30.0–36.0)
MCV: 89.5 fL (ref 80.0–100.0)
Platelets: 78 10*3/uL — ABNORMAL LOW (ref 150–400)
RBC: 4.48 MIL/uL (ref 3.87–5.11)
RDW: 15.8 % — ABNORMAL HIGH (ref 11.5–15.5)
WBC: 7 10*3/uL (ref 4.0–10.5)
nRBC: 0.3 % — ABNORMAL HIGH (ref 0.0–0.2)

## 2022-07-06 LAB — CSF CULTURE W GRAM STAIN
Culture: NO GROWTH
Gram Stain: NONE SEEN

## 2022-07-06 LAB — GLUCOSE, CAPILLARY
Glucose-Capillary: 129 mg/dL — ABNORMAL HIGH (ref 70–99)
Glucose-Capillary: 137 mg/dL — ABNORMAL HIGH (ref 70–99)

## 2022-07-06 LAB — MAGNESIUM: Magnesium: 1.9 mg/dL (ref 1.7–2.4)

## 2022-07-06 LAB — CK: Total CK: 30 U/L — ABNORMAL LOW (ref 38–234)

## 2022-07-06 LAB — VITAMIN B1: Vitamin B1 (Thiamine): 105.4 nmol/L (ref 66.5–200.0)

## 2022-07-06 LAB — PHOSPHORUS: Phosphorus: 2.2 mg/dL — ABNORMAL LOW (ref 2.5–4.6)

## 2022-07-06 MED ORDER — FLEET ENEMA 7-19 GM/118ML RE ENEM
1.0000 | ENEMA | Freq: Once | RECTAL | Status: AC
  Administered 2022-07-06: 1 via RECTAL

## 2022-07-06 MED ORDER — POTASSIUM PHOSPHATES 15 MMOLE/5ML IV SOLN
15.0000 mmol | Freq: Once | INTRAVENOUS | Status: AC
  Administered 2022-07-06: 15 mmol via INTRAVENOUS
  Filled 2022-07-06: qty 5

## 2022-07-06 NOTE — Progress Notes (Addendum)
PROGRESS NOTE  Barbara Nelson YHC:[id number] DOB: 30-Jan-1941   PCP: Cari Caraway, MD  Patient is from: SNF  DOA: 06/30/2022 LOS: 6  Chief complaints Chief Complaint  Patient presents with   Fatigue   Abdominal Pain   Emesis     Brief Narrative / Interim history: 82 year old F with PMH of diastolic CHF, chronic hypoxic RF on 2 L, HTN, and recent hospitalization from 2/1-10 with CAP and acute on chronic CHF returning with altered mental status, lethargy and vomiting, and admitted with working diagnosis of HCAP and encephalopathy.  Family denies history of dementia or cognitive impairment.  They significant decline in cognition since she was diagnosed with shingles in January and started on gabapentin.  She was weaned off gabapentin but she continued to decline, even worse after her recent hospitalization.  She is currently not on any sedating medication.  In ED, slightly hypertensive.  Not febrile.  Saturating in upper 90s to 100 on 2 L.  No leukocytosis.  Hgb 15.2 (12.4 about 12 days ago).  Slightly elevated LFT and Cr.  Lipase, BNP, ammonia, TSH, CK and procalcitonin within normal.  Lactic acid 2.1.  VBG suggess mild metabolic alkalosis.  UDS negative.  UA with > 300 protein.  XR with minimal strandy and hazy opacity at both lung bases suggesting atelectasis or infection, small left pleural effusion and stable cardiomegaly.  CT abdomen and pelvis with contrast showed large amount of stool in the rectum concerning for impaction.  She had disimpaction in ED.  CT head without acute finding.  MRI brain without acute finding but limited by motion.  Patient was started on IVF, IV cefepime and vancomycin and admitted with working diagnosis of HCAP, encephalopathy and dehydration.  The next day, pneumonia or infection felt to be less likely.  Blood cultures negative.  Antibiotic discontinued.  Further encephalopathy workup including ABG, UDS and B12 unrevealing.  Neurology consulted and she had  lumbar puncture.  CSF studies unrevealing so far.  EEG suggest moderate diffuse encephalopathy but no seizure or epileptiform discharge.  Started on high-dose thiamine without significant improvement.  Family requested transfer to [facility] but both at full capacity.  Fortunately, encephalopathy improved.    Subjective: Seen and examined earlier this morning.  Per daughter, patient had a very good sleep last night.  No complaints other than pain in her bottom from continuously lying in bed.  Likes to get out of the bed today.  Objective: Vitals:   07/06/22 0400 07/06/22 0500 07/06/22 0533 07/06/22 0753  BP: 129/66  111/71 118/84  Pulse: 68  71   Resp: 16     Temp:    97.7 F (36.5 C)  TempSrc:    Axillary  SpO2: 99%  92%   Weight:  90.4 kg      Examination:  GENERAL: No apparent distress.  Nontoxic. HEENT: MMM.  Vision and hearing grossly intact.  NECK: Supple.  No apparent JVD.  RESP:  No IWOB.  Fair aeration bilaterally. CVS:  RRR. Heart sounds normal.  ABD/GI/GU: BS+. Abd soft, NTND.  MSK/EXT:   No apparent deformity. Moves extremities but weak globally.  No edema. SKIN: no apparent skin lesion or wound NEURO: Awake and alert. Oriented to self, family and state.  No apparent focal neuro deficit. PSYCH: Calm. Normal affect.   Procedures:  2/15-lumbar puncture.  Microbiology summarized: DVVOH-60, influenza and RSV PCR nonreactive MRSA PCR screen nonreactive Blood cultures NGTD CSF cultures NGTD.  Assessment and plan: Principal Problem:  Acute metabolic encephalopathy Active Problems:   Acute hypoxic respiratory failure (HCC)   Essential hypertension   Hypercalcemia   Dehydration   Acute prerenal azotemia   Transaminitis   Constipation   DM2 (diabetes mellitus, type 2) (HCC)   Allergic rhinitis   Chronic diastolic CHF (congestive heart failure) (HCC)  Acute metabolic encephalopathy: Initially felt to be dehydration and delirium but mental status worse  despite hydration.  Encephalopathy workup including CT head, MRI brain, ammonia, TSH, VBG, UDS, B12, RPR, CSF study and EEG unrevealing.  Delirium remains on differential.  I do not know if this could be catatonia.  Nutrition could play a role.  Fortunately, encephalopathy improved.  Now awake and alert and oriented to self and family. -Reorientation and delirium precaution -Continue high-dose thiamine -SLP advance diet to FLD. -Fall and aspiration precaution. -PT/OT/SLP/RD eval. -[facility] called for transfer but at capacity. -Appreciate help by neurology  Vomiting/constipation/possible fecal impaction:  CT without acute finding other than fecal impaction.  RUQ [place] negative. S/p disimpaction in ED. Had multiple loose stools after enema -Appreciate input by GI -Continue bowel regimen -Antiemetics as needed -Continue IV fluid  Chronic diastolic CHF: TTE on 2/2 with LVEF of 50 to 55% and G1-DD.  She was somewhat dehydrated on presentation.  She seems to be on Lasix.  No CHF on chest x-ray. -Closely monitor fluid and respiratory status while on IV fluid -Continue holding Lasix -Strict intake and output, daily weights, renal functions and electrolytes  Chronic hypoxic respiratory failure: On 2 L at baseline.  Stable. -Minimum oxygen to keep saturation above 90%. -Try incentive spirometry and pulmonary toilet  HCAP/pneumonia ruled out: No fever, leukocytosis.  Procalcitonin within normal.  COVID-19, influenza and RSV PCR nonreactive.  MRSA PCR negative.  Full RVP negative.  Blood cultures NGTD.  Cough likely due to silent aspiration in the setting of encephalopathy and vomiting. -Appreciate input by pulmonology. -Continue aspiration precaution and pulmonary toilet.  Dehydration: Likely due to poor intake and vomiting.  Seems to have resolved with IV hydration. -Continue D5-1/2NS-KCl  Controlled IDDM-2 with hyperglycemia: A1c 6.9%. -Continue current insulin regimen  Elevated LFT:  CK  within normal.  LFT improved.  RUQ [place] negative.  Stable. -Continue monitoring  Hypokalemia/hypophosphatemia -Monitor replenish as appropriate.  Hypercalcemia: Likely secondary due to dehydration.  Resolved with hydration.  Thrombocytopenia: Platelet continues to decline. -Discontinue Lovenox -Check HIT lab -SCD for VTE prophylaxis  Generalized weakness/lethargy: In the setting of encephalopathy as above. -PT/OT eval as above  Obesity/decreased oral intake: Decreased oral intake likely due to encephalopathy.  Prealbumin 11.  Resistant NG tube insertion on 2/16.  Now encephalopathy resolved.  Diet advanced to FLD. Body mass index is 33.16 kg/m. Nutrition Problem: Inadequate oral intake Etiology: lethargy/confusion, dysphagia Signs/Symptoms: NPO status (pocketing/holding food) Interventions: Refer to RD note for recommendations, Ensure Enlive (each supplement provides 350kcal and 20 grams of protein)   DVT prophylaxis:  Place and maintain sequential compression device Start: 07/05/22 0743 SCDs Start: 06/30/22 2019  Code Status: Full code Family Communication: Updated patient's daughters in person and over the phone Level of care: Med-Surg Status is: Inpatient Remains inpatient appropriate because: Acute metabolic encephalopathy, dehydration and lethargy.   Final disposition: To be determined. Consultants:  Neurology Pulmonology Gastroenterology  35 minutes with more than 50% spent in reviewing records, counseling patient/family and coordinating care.   Sch Meds:  Scheduled Meds:  bisacodyl  10 mg Rectal Daily   fluconazole  150 mg Oral Once  fluticasone  2 spray Each Nare q morning   insulin aspart  0-6 Units Subcutaneous TID WC   magic mouthwash w/lidocaine  5 mL Oral QID   metoprolol tartrate  5 mg Intravenous Q8H   nystatin  5 mL Oral QID   polyethylene glycol  17 g Oral BID   [START ON 07/10/2022] thiamine (VITAMIN B1) injection  100 mg Intravenous Daily    Continuous Infusions:  dextrose 5 % and 0.45% NaCl 1,000 mL with potassium chloride 30 mEq infusion 100 mL/hr at 07/06/22 0903   potassium PHOSPHATE IVPB (in mmol) 15 mmol (07/06/22 1019)   thiamine (VITAMIN B1) injection 250 mg (07/06/22 0906)   PRN Meds:.acetaminophen **OR** acetaminophen, gadobutrol, guaiFENesin, ipratropium-albuterol, menthol-cetylpyridinium, milk and molasses, ondansetron (ZOFRAN) IV  Antimicrobials: Anti-infectives (From admission, onward)    Start     Dose/Rate Route Frequency Ordered Stop   07/04/22 1730  fluconazole (DIFLUCAN) tablet 150 mg        150 mg Oral  Once 07/04/22 1633     07/01/22 2200  vancomycin (VANCOCIN) IVPB 1000 mg/200 mL premix  Status:  Discontinued        1,000 mg 200 mL/hr over 60 Minutes Intravenous Every 24 hours 06/30/22 2136 07/01/22 1047   07/01/22 0800  ceFEPIme (MAXIPIME) 2 g in sodium chloride 0.9 % 100 mL IVPB  Status:  Discontinued        2 g 200 mL/hr over 30 Minutes Intravenous Every 8 hours 07/01/22 0735 07/01/22 1047   06/30/22 2145  ceFEPIme (MAXIPIME) 2 g in sodium chloride 0.9 % 100 mL IVPB  Status:  Discontinued        2 g 200 mL/hr over 30 Minutes Intravenous Every 12 hours 06/30/22 2136 07/01/22 0735   06/30/22 2145  vancomycin (VANCOREADY) IVPB 2000 mg/400 mL        2,000 mg 200 mL/hr over 120 Minutes Intravenous  Once 06/30/22 2136 07/01/22 0204        I have personally reviewed the following labs and images: CBC: Recent Labs  Lab 06/30/22 1406 06/30/22 2000 07/01/22 0420 07/01/22 0830 07/02/22 0834 07/03/22 0742 07/04/22 0810 07/05/22 0433 07/06/22 0420  WBC 7.2  --  9.3  --  8.0 6.4 5.9 7.1 7.0  NEUTROABS 4.7  --  6.1  --  5.3 3.7  --   --   --   HGB 15.2*   < > 13.1   < > 13.6 13.2 13.2 13.0 12.3  HCT 46.8*   < > 40.5   < > 42.8 41.3 41.4 42.4 40.1  MCV 87.5  --  87.1  --  86.1 87.7 86.8 88.0 89.5  PLT 192  --  223  --  172 132* 101* 88* 78*   < > = values in this interval not displayed.    BMP &GFR Recent Labs  Lab 07/02/22 0834 07/03/22 0742 07/04/22 0810 07/05/22 0433 07/06/22 0420  NA 144 148* 144 143 141  K 3.4* 3.6 3.5 3.3* 3.6  CL 109 111 111 108 109  CO2 27 29 28 30 27   GLUCOSE 129* 114* 160* 118* 120*  BUN 17 17 14 12 11   CREATININE 0.79 0.82 0.76 0.83 0.75  CALCIUM 9.9 9.8 9.2 9.1 8.5*  MG 2.2 2.3 2.1 2.1 1.9  PHOS 2.3* 2.5 2.0* 3.1 2.2*   Estimated Creatinine Clearance: 60.3 mL/min (by C-G formula based on SCr of 0.75 mg/dL). Liver & Pancreas: Recent Labs  Lab 07/02/22 0834 07/03/22 0742 07/04/22 0810 07/05/22  2951 07/06/22 0420  AST 55* 58* 62* 68* 70*  ALT 42 40 40 45* 45*  ALKPHOS 67 64 67 63 62  BILITOT 1.4* 1.1 0.9 0.7 0.5  PROT 6.7 6.0* 5.7* 5.5* 5.0*  ALBUMIN 2.1* 1.9* 1.7* 1.7* <1.5*   Recent Labs  Lab 06/30/22 1406  LIPASE 24   Recent Labs  Lab 06/30/22 1952  AMMONIA 15   Diabetic: No results for input(s): "HGBA1C" in the last 72 hours. Recent Labs  Lab 07/04/22 2320 07/05/22 0851 07/05/22 1405 07/06/22 0757 07/06/22 1205  GLUCAP 127* 127* 148* 129* 137*   Cardiac Enzymes: Recent Labs  Lab 06/30/22 2015 07/06/22 0420  CKTOTAL 21* 30*   No results for input(s): "PROBNP" in the last 8760 hours. Coagulation Profile: Recent Labs  Lab 07/01/22 0505  INR 1.0   Thyroid Function Tests: No results for input(s): "TSH", "T4TOTAL", "FREET4", "T3FREE", "THYROIDAB" in the last 72 hours.  Lipid Profile: No results for input(s): "CHOL", "HDL", "LDLCALC", "TRIG", "CHOLHDL", "LDLDIRECT" in the last 72 hours. Anemia Panel: No results for input(s): "VITAMINB12", "FOLATE", "FERRITIN", "TIBC", "IRON", "RETICCTPCT" in the last 72 hours.  Urine analysis:    Component Value Date/Time   COLORURINE YELLOW 07/01/2022 0610   APPEARANCEUR CLEAR 07/01/2022 0610   LABSPEC 1.038 (H) 07/01/2022 0610   PHURINE 5.0 07/01/2022 0610   GLUCOSEU NEGATIVE 07/01/2022 0610   HGBUR NEGATIVE 07/01/2022 0610   BILIRUBINUR NEGATIVE  07/01/2022 0610   KETONESUR 5 (A) 07/01/2022 0610   PROTEINUR >=300 (A) 07/01/2022 0610   UROBILINOGEN 0.2 09/15/2011 0530   NITRITE NEGATIVE 07/01/2022 0610   LEUKOCYTESUR NEGATIVE 07/01/2022 0610   Sepsis Labs: Invalid input(s): "PROCALCITONIN", "LACTICIDVEN"  Microbiology: Recent Results (from the past 240 hour(s))  Resp panel by RT-PCR (RSV, Flu A&B, Covid) Anterior Nasal Swab     Status: None   Collection Time: 06/30/22  1:31 PM   Specimen: Anterior Nasal Swab  Result Value Ref Range Status   SARS Coronavirus 2 by RT PCR NEGATIVE NEGATIVE Final   Influenza A by PCR NEGATIVE NEGATIVE Final   Influenza B by PCR NEGATIVE NEGATIVE Final    Comment: (NOTE) The Xpert Xpress SARS-CoV-2/FLU/RSV plus assay is intended as an aid in the diagnosis of influenza from Nasopharyngeal swab specimens and should not be used as a sole basis for treatment. Nasal washings and aspirates are unacceptable for Xpert Xpress SARS-CoV-2/FLU/RSV testing.  Fact Sheet for Patients: EntrepreneurPulse.com.au  Fact Sheet for Healthcare Providers: IncredibleEmployment.be  This test is not yet approved or cleared by the [place] FDA and has been authorized for detection and/or diagnosis of SARS-CoV-2 by FDA under an Emergency Use Authorization (EUA). This EUA will remain in effect (meaning this test can be used) for the duration of the COVID-19 declaration under Section 564(b)(1) of the Act, 21 U.S.C. section 360bbb-3(b)(1), unless the authorization is terminated or revoked.     Resp Syncytial Virus by PCR NEGATIVE NEGATIVE Final    Comment: (NOTE) Fact Sheet for Patients: EntrepreneurPulse.com.au  Fact Sheet for Healthcare Providers: IncredibleEmployment.be  This test is not yet approved or cleared by the [place] FDA and has been authorized for detection and/or diagnosis of SARS-CoV-2 by FDA under an Emergency Use  Authorization (EUA). This EUA will remain in effect (meaning this test can be used) for the duration of the COVID-19 declaration under Section 564(b)(1) of the Act, 21 U.S.C. section 360bbb-3(b)(1), unless the authorization is terminated or revoked.  Performed at [facility], [place] [address],  [place], [place] [address]   Culture, blood (routine x 2)     Status: None   Collection Time: 06/30/22  7:52 PM   Specimen: BLOOD  Result Value Ref Range Status   Specimen Description BLOOD LEFT ANTECUBITAL  Final   Special Requests   Final    BOTTLES DRAWN AEROBIC AND ANAEROBIC Blood Culture adequate volume   Culture   Final    NO GROWTH 5 DAYS Performed at [facility], [address], [place], [place] [address]    Report Status 07/05/2022 FINAL  Final  Culture, blood (routine x 2)     Status: None   Collection Time: 06/30/22  8:28 PM   Specimen: BLOOD LEFT HAND  Result Value Ref Range Status   Specimen Description BLOOD LEFT HAND  Final   Special Requests   Final    BOTTLES DRAWN AEROBIC AND ANAEROBIC Blood Culture adequate volume   Culture   Final    NO GROWTH 5 DAYS Performed at [facility], [place] [address], [place], [place] [address]    Report Status 07/05/2022 FINAL  Final  MRSA Next Gen by PCR, Nasal     Status: None   Collection Time: 06/30/22 11:21 PM  Result Value Ref Range Status   MRSA by PCR Next Gen NOT DETECTED NOT DETECTED Final    Comment: (NOTE) The GeneXpert MRSA Assay (FDA approved for NASAL specimens only), is one component of a comprehensive MRSA colonization surveillance program. It is not intended to diagnose MRSA infection nor to guide or monitor treatment for MRSA infections. Test performance is not FDA approved in patients less than 2 years old. Performed at [facility], [place] [address], [place], [place] [address]   CSF culture w Gram Stain     Status: None   Collection Time: 07/02/22  4:46 PM   Specimen: CSF; Cerebrospinal  Fluid  Result Value Ref Range Status   Specimen Description CSF  Final   Special Requests LP  Final   Gram Stain NO WBC SEEN NO ORGANISMS SEEN CYTOSPIN SMEAR   Final   Culture   Final    NO GROWTH 3 DAYS Performed at [facility], [place] [address] [place], [place] [address]    Report Status 07/06/2022 FINAL  Final  Respiratory (~20 pathogens) panel by PCR     Status: None   Collection Time: 07/02/22  6:00 PM   Specimen: Nasopharyngeal Swab; Respiratory  Result Value Ref Range Status   Adenovirus NOT DETECTED NOT DETECTED Final   Coronavirus 229E NOT DETECTED NOT DETECTED Final    Comment: (NOTE) The Coronavirus on the Respiratory Panel, DOES NOT test for the novel  Coronavirus (2019 nCoV)    Coronavirus HKU1 NOT DETECTED NOT DETECTED Final   Coronavirus NL63 NOT DETECTED NOT DETECTED Final   Coronavirus OC43 NOT DETECTED NOT DETECTED Final   Metapneumovirus NOT DETECTED NOT DETECTED Final   Rhinovirus / Enterovirus NOT DETECTED NOT DETECTED Final   Influenza A NOT DETECTED NOT DETECTED Final   Influenza B NOT DETECTED NOT DETECTED Final   Parainfluenza Virus 1 NOT DETECTED NOT DETECTED Final   Parainfluenza Virus 2 NOT DETECTED NOT DETECTED Final   Parainfluenza Virus 3 NOT DETECTED NOT DETECTED Final   Parainfluenza Virus 4 NOT DETECTED NOT DETECTED Final   Respiratory Syncytial Virus NOT DETECTED NOT DETECTED Final   Bordetella pertussis NOT DETECTED NOT DETECTED Final   Bordetella Parapertussis NOT DETECTED NOT DETECTED Final  Chlamydophila pneumoniae NOT DETECTED NOT DETECTED Final   Mycoplasma pneumoniae NOT DETECTED NOT DETECTED Final    Comment: Performed at [facility], [place] [address] [place],  [address]    Radiology Studies: No results found.    Saba Gomm T. Surry  If 7PM-7AM, please contact night-coverage [email or web address] 07/06/2022, 12:39 PM

## 2022-07-06 NOTE — Progress Notes (Signed)
[facility] Gastroenterology Progress Note  Barbara Nelson 82 y.o. January 15, 1941  CC:  encephalopathy, fecal impaction   Subjective: Patient seen and examined at bedside.  Family at bedside.  Patient is complaining of some rectal discomfort.  ROS : afebrile, negative for chest pain   Objective: Vital signs in last 24 hours: Vitals:   07/06/22 0533 07/06/22 0753  BP: 111/71 118/84  Pulse: 71   Resp:    Temp:  97.7 F (36.5 C)  SpO2: 92%     Physical Exam:  Elderly patient, not in acute distress.  More alert today.  Abdominal exam benign.  Bowel Sounds present.  No peritoneal signs.  Lab Results: Recent Labs    07/05/22 0433 07/06/22 0420  NA 143 141  K 3.3* 3.6  CL 108 109  CO2 30 27  GLUCOSE 118* 120*  BUN 12 11  CREATININE 0.83 0.75  CALCIUM 9.1 8.5*  MG 2.1 1.9  PHOS 3.1 2.2*   Recent Labs    07/05/22 0433 07/06/22 0420  AST 68* 70*  ALT 45* 45*  ALKPHOS 63 62  BILITOT 0.7 0.5  PROT 5.5* 5.0*  ALBUMIN 1.7* <1.5*   Recent Labs    07/05/22 0433 07/06/22 0420  WBC 7.1 7.0  HGB 13.0 12.3  HCT 42.4 40.1  MCV 88.0 89.5  PLT 88* 78*   No results for input(s): "LABPROT", "INR" in the last 72 hours.    Assessment/Plan: -Constipation and fecal impaction seen on the CT scan few days ago.  She is not having regular bowel movements according to family members.  Improving. -Nausea and vomiting.  Improving. -Encephalopathy unknown etiology.  Neurology following.  Currently n.p.o. status per speech recommendations. -Elevated LFTs.-likely transient.  CT and ultrasound showed normal-appearing liver. -Thrombocytopenia.   Recommendations -------------------------- -No family at bedside today.  Called patient's daughter but no answer.  Patient is complaining of some rectal discomfort which could be from retained stool ball. -Recommend 1 more Fleet enema.  She may need flexible sigmoidoscopy for fecal disimpaction which may need to done under sedation to avoid  significant rectal discomfort.  Giving sedation may impair her mentation so prefer to hold off on doing any procedure for next 1 to 2 days. -Repeat abdominal x-ray tomorrow. -GI will follow     Otis Brace MD, [place] 07/06/2022, 1:24 PM  Contact #  [phone]

## 2022-07-06 NOTE — Progress Notes (Signed)
Speech Language Pathology Treatment: Dysphagia  Patient Details Name: CEIRA HOESCHEN MRN: [id number] DOB: 1940-09-17 Today's Date: 07/06/2022 Time: 7867-5449 SLP Time Calculation (min) (ACUTE ONLY): 13 min  Assessment / Plan / Recommendation Clinical Impression  Pt seen after being started on clear liquids yesterday and repositioned. She is alert, responsive and answering questions this morning with daughter at bedside. She consumed thin water and bites applesauce with timely oral manipulation and transit. Able to use straw without difficulty for sips without s/s aspiration exhibited. Observation of pharyngeal swallows appeared well timed with respirations. Discussed attempting solids this morning however daughter preferred to move to full liquids and defer mastication today. Full liquids ordered and ST will follow for ability to start on chewable solids. Sit upright, pills whole in puree.    HPI HPI: Pt is a 82 y.o. female who presented with AMS and was admitted with acute metabolic encephalopathy in the setting of HCAP pneumonia. CXR 2/13: Minimal strandy and hazy opacities in both lung bases may  represent atelectasis or infection. PMH: type 2 diabetes mellitus, essential hypertension, chronic diastolic heart failure, chronic hypoxic respiratory failure. BSE 06/20/22 regular texture diet with thin liquids recommended.      SLP Plan  Continue with current plan of care      Recommendations for follow up therapy are one component of a multi-disciplinary discharge planning process, led by the attending physician.  Recommendations may be updated based on patient status, additional functional criteria and insurance authorization.    Recommendations  Diet recommendations: Thin liquid;Other(comment) (full liquid) Liquids provided via: Straw Medication Administration: Crushed with puree Supervision: Staff to assist with self feeding;Full supervision/cueing for compensatory  strategies Compensations: Slow rate;Small sips/bites;Minimize environmental distractions Postural Changes and/or Swallow Maneuvers: Seated upright 90 degrees                Oral Care Recommendations: Oral care BID Follow Up Recommendations: Skilled nursing-short term rehab (<3 hours/day) Assistance recommended at discharge: Frequent or constant Supervision/Assistance SLP Visit Diagnosis: Dysphagia, unspecified (R13.10) Plan: Continue with current plan of care           Houston Siren  07/06/2022, 10:29 AM

## 2022-07-06 NOTE — Progress Notes (Signed)
CPT held at this time. Patient is eating breakfast. BBS: clear / diminished

## 2022-07-06 NOTE — Progress Notes (Addendum)
Physical Therapy Treatment Patient Details Name: Barbara Nelson MRN: [id number] DOB: 1940/07/08 Today's Date: 07/06/2022   History of Present Illness Pt is an 82 y/o female admitted for AMS in setting of HCAP PNA. Also noted dehydration and fecal impaction. Recent admission 2/1-2/10 for same. PMH: DM2, HTN, CHF.    PT Comments    Pt with improved cognition and tolerance of today's session from initial evaluation. Pt more alert and responding to questions, but continues to require increased time for mobility and command following. EOB balance improved today, only occasional minA required but able to maintain with minG a majority of the time. Attempted standing today with maxA but only minimal bottom clearance achieved. [name] daughter reported MD wanted pt in chair today, able to utilize stedy lift with modAx2 to stand and transfer to chair. Pt will continue to benefit from skilled acute PT to progress mobility, strength, and balance, discharge plan remains appropriate.     Recommendations for follow up therapy are one component of a multi-disciplinary discharge planning process, led by the attending physician.  Recommendations may be updated based on patient status, additional functional criteria and insurance authorization.  Follow Up Recommendations  Skilled nursing-short term rehab (<3 hours/day) Can patient physically be transported by private vehicle: No   Assistance Recommended at Discharge Frequent or constant Supervision/Assistance  Patient can return home with the following Assistance with cooking/housework;Assist for transportation;Help with stairs or ramp for entrance;Two people to help with walking and/or transfers;Two people to help with bathing/dressing/bathroom;Direct supervision/assist for medications management   Equipment Recommendations  None recommended by PT    Recommendations for Other Services       Precautions / Restrictions Precautions Precautions: Fall;Other  (comment) Precaution Comments: monitor O2 Restrictions Weight Bearing Restrictions: No     Mobility  Bed Mobility Overal bed mobility: Needs Assistance Bed Mobility: Supine to Sit     Supine to sit: Max assist, HOB elevated     General bed mobility comments: Verbal cues provided for sequencing with assist for BLE management as well as trunk support into sitting.    Transfers Overall transfer level: Needs assistance Equipment used: Rolling walker (2 wheels), 2 person hand held assist Transfers: Sit to/from Stand Sit to Stand: Max assist, From elevated surface (very minimal bottom clearance)           General transfer comment: Attempted to stand x1 with RW and x1 with 2HHA and maxA but only minimal bottom clearance achieved. Utilizing [place] pt standing with modAx2 and pulling up to transfer to bedside chair Transfer via Lift Equipment: Stedy  Ambulation/Gait                   Stairs             Wheelchair Mobility    Modified Rankin (Stroke Patients Only)       Balance Overall balance assessment: Needs assistance Sitting-balance support: Bilateral upper extremity supported, Feet supported Sitting balance-Leahy Scale: Poor Sitting balance - Comments: pt sitting EOB primarily minG but occasional minA required due to intermittent posterior lean, pt provided cueing for use of BUE and core muscles to pull herself forward Postural control: Posterior lean                                  Cognition Arousal/Alertness: Awake/alert Behavior During Therapy: Flat affect Overall Cognitive Status: Impaired/Different from baseline Area of Impairment: Memory, Following commands, Problem solving,  Attention, Safety/judgement, Awareness                   Current Attention Level: Sustained Memory: Decreased short-term memory Following Commands: Follows one step commands consistently, Follows one step commands with increased  time Safety/Judgement: Decreased awareness of safety, Decreased awareness of deficits Awareness: Intellectual Problem Solving: Slow processing, Decreased initiation, Requires verbal cues, Difficulty sequencing General Comments: pt requiring increased time to follow commands with use of verbal and visual cues. More alert compared to last session        Exercises      General Comments General comments (skin integrity, edema, etc.): O2 stable on 2L O2 [place] with mobility      Pertinent Vitals/Pain Pain Assessment Pain Assessment: Faces Faces Pain Scale: No hurt    Home Living                          Prior Function            PT Goals (current goals can now be found in the care plan section) Acute Rehab PT Goals Patient Stated Goal: family reports goal is to get more independent to return home PT Goal Formulation: With patient/family Time For Goal Achievement: 07/16/22 Potential to Achieve Goals: Fair Progress towards PT goals: Progressing toward goals    Frequency    Min 3X/week      PT Plan Current plan remains appropriate    Co-evaluation              AM-PAC PT "6 Clicks" Mobility   Outcome Measure  Help needed turning from your back to your side while in a flat bed without using bedrails?: A Lot Help needed moving from lying on your back to sitting on the side of a flat bed without using bedrails?: A Lot Help needed moving to and from a bed to a chair (including a wheelchair)?: Total Help needed standing up from a chair using your arms (e.g., wheelchair or bedside chair)?: Total Help needed to walk in hospital room?: Total Help needed climbing 3-5 steps with a railing? : Total 6 Click Score: 8    End of Session Equipment Utilized During Treatment: Oxygen;Gait belt Activity Tolerance: Patient tolerated treatment well Patient left: with call bell/phone within reach;with family/visitor present;in chair;with chair alarm set Nurse Communication:  Mobility status PT Visit Diagnosis: Difficulty in walking, not elsewhere classified (R26.2);Muscle weakness (generalized) (M62.81)     Time: 2919-1660 PT Time Calculation (min) (ACUTE ONLY): 45 min  Charges:  $Therapeutic Activity: 38-52 mins                     Charlynne Cousins, PT DPT Acute Rehabilitation Services Office [phone]    Luvenia Heller 07/06/2022, 3:28 PM

## 2022-07-06 NOTE — Care Management Important Message (Signed)
Important Message  Patient Details  Name: Barbara Nelson MRN: [id number] Date of Birth: 11-24-1940   Medicare Important Message Given:  Yes     Humphrey Guerreiro [address] 07/06/2022, 3:40 PM

## 2022-07-07 ENCOUNTER — Inpatient Hospital Stay (HOSPITAL_COMMUNITY): Payer: Medicare Other

## 2022-07-07 DIAGNOSIS — I5032 Chronic diastolic (congestive) heart failure: Secondary | ICD-10-CM | POA: Diagnosis not present

## 2022-07-07 DIAGNOSIS — G9341 Metabolic encephalopathy: Secondary | ICD-10-CM | POA: Diagnosis not present

## 2022-07-07 DIAGNOSIS — K5900 Constipation, unspecified: Secondary | ICD-10-CM | POA: Diagnosis not present

## 2022-07-07 DIAGNOSIS — N19 Unspecified kidney failure: Secondary | ICD-10-CM | POA: Diagnosis not present

## 2022-07-07 LAB — CBC
HCT: 40.9 % (ref 36.0–46.0)
Hemoglobin: 13.2 g/dL (ref 12.0–15.0)
MCH: 27.7 pg (ref 26.0–34.0)
MCHC: 32.3 g/dL (ref 30.0–36.0)
MCV: 85.9 fL (ref 80.0–100.0)
Platelets: 72 10*3/uL — ABNORMAL LOW (ref 150–400)
RBC: 4.76 MIL/uL (ref 3.87–5.11)
RDW: 15.5 % (ref 11.5–15.5)
WBC: 6.9 10*3/uL (ref 4.0–10.5)
nRBC: 0 % (ref 0.0–0.2)

## 2022-07-07 LAB — GLUCOSE, CAPILLARY
Glucose-Capillary: 160 mg/dL — ABNORMAL HIGH (ref 70–99)
Glucose-Capillary: 122 mg/dL — ABNORMAL HIGH (ref 70–99)
Glucose-Capillary: 120 mg/dL — ABNORMAL HIGH (ref 70–99)
Glucose-Capillary: 124 mg/dL — ABNORMAL HIGH (ref 70–99)
Glucose-Capillary: 126 mg/dL — ABNORMAL HIGH (ref 70–99)

## 2022-07-07 LAB — COMPREHENSIVE METABOLIC PANEL
ALT: 43 U/L (ref 0–44)
AST: 60 U/L — ABNORMAL HIGH (ref 15–41)
Albumin: 1.5 g/dL — ABNORMAL LOW (ref 3.5–5.0)
Alkaline Phosphatase: 67 U/L (ref 38–126)
Anion gap: 5 (ref 5–15)
BUN: 10 mg/dL (ref 8–23)
CO2: 25 mmol/L (ref 22–32)
Calcium: 8.3 mg/dL — ABNORMAL LOW (ref 8.9–10.3)
Chloride: 109 mmol/L (ref 98–111)
Creatinine, Ser: 0.66 mg/dL (ref 0.44–1.00)
GFR, Estimated: >60 mL/min (ref >60)
Glucose, Bld: 145 mg/dL — ABNORMAL HIGH (ref 70–99)
Potassium: 3.7 mmol/L (ref 3.5–5.1)
Sodium: 139 mmol/L (ref 135–145)
Total Bilirubin: 0.5 mg/dL (ref 0.3–1.2)
Total Protein: 5.3 g/dL — ABNORMAL LOW (ref 6.5–8.1)

## 2022-07-07 LAB — CK: Total CK: 31 U/L — ABNORMAL LOW (ref 38–234)

## 2022-07-07 LAB — PHOSPHORUS: Phosphorus: 2.6 mg/dL (ref 2.5–4.6)

## 2022-07-07 LAB — MAGNESIUM: Magnesium: 1.8 mg/dL (ref 1.7–2.4)

## 2022-07-07 LAB — HEPARIN INDUCED PLATELET AB (HIT ANTIBODY): Heparin Induced Plt Ab: 0.114 OD (ref 0.000–0.400)

## 2022-07-07 MED ORDER — FLEET ENEMA 7-19 GM/118ML RE ENEM
1.0000 | ENEMA | Freq: Once | RECTAL | Status: AC
  Administered 2022-07-07: 1 via RECTAL
  Filled 2022-07-07: qty 1

## 2022-07-07 MED ORDER — METOPROLOL TARTRATE 5 MG/5ML IV SOLN
2.5000 mg | Freq: Three times a day (TID) | INTRAVENOUS | Status: DC
  Administered 2022-07-07 – 2022-07-09 (×6): 2.5 mg via INTRAVENOUS
  Filled 2022-07-07 (×7): qty 5

## 2022-07-07 MED ORDER — ADULT MULTIVITAMIN W/MINERALS CH
1.0000 | ORAL_TABLET | Freq: Every day | ORAL | Status: DC
  Administered 2022-07-07 – 2022-07-16 (×9): 1 via ORAL
  Filled 2022-07-07 (×9): qty 1

## 2022-07-07 MED ORDER — BOOST / RESOURCE BREEZE PO LIQD CUSTOM
1.0000 | Freq: Two times a day (BID) | ORAL | Status: DC
  Administered 2022-07-07 – 2022-07-14 (×12): 1 via ORAL

## 2022-07-07 MED ORDER — POTASSIUM CHLORIDE 2 MEQ/ML IV SOLN
INTRAVENOUS | Status: DC
  Filled 2022-07-07 (×5): qty 1000

## 2022-07-07 MED ORDER — KCL IN DEXTROSE-NACL 30-5-0.45 MEQ/L-%-% IV SOLN
INTRAVENOUS | Status: DC
  Filled 2022-07-07 (×2): qty 1000

## 2022-07-07 MED ORDER — LIDOCAINE 5 % EX OINT
TOPICAL_OINTMENT | Freq: Three times a day (TID) | CUTANEOUS | Status: DC
  Administered 2022-07-12: 1 via TOPICAL
  Filled 2022-07-07 (×2): qty 35.44

## 2022-07-07 NOTE — Progress Notes (Signed)
PROGRESS NOTE  Barbara Nelson QQI:[id number] DOB: [birth year]   PCP: Cari Caraway, MD  Patient is from: SNF  DOA: 06/30/2022 LOS: 7  Chief complaints Chief Complaint  Patient presents with   Fatigue   Abdominal Pain   Emesis     Brief Narrative / Interim history: 82 year old F with PMH of diastolic CHF, chronic hypoxic RF on 2 L, HTN, and recent hospitalization from 2/1-10 with CAP and acute on chronic CHF returning with altered mental status, lethargy and vomiting, and admitted with working diagnosis of HCAP and encephalopathy.  Family denies history of dementia or cognitive impairment.  They significant decline in cognition since she was diagnosed with shingles in January and started on gabapentin.  She was weaned off gabapentin but she continued to decline, even worse after her recent hospitalization.  She is currently not on any sedating medication.  In ED, slightly hypertensive.  Not febrile.  Saturating in upper 90s to 100 on 2 L.  No leukocytosis.  Hgb 15.2 (12.4 about 12 days ago).  Slightly elevated LFT and Cr.  Lipase, BNP, ammonia, TSH, CK and procalcitonin within normal.  Lactic acid 2.1.  VBG suggess mild metabolic alkalosis.  UDS negative.  UA with > 300 protein.  XR with minimal strandy and hazy opacity at both lung bases suggesting atelectasis or infection, small left pleural effusion and stable cardiomegaly.  CT abdomen and pelvis with contrast showed large amount of stool in the rectum concerning for impaction.  She had disimpaction in ED.  CT head without acute finding.  MRI brain without acute finding but limited by motion.  Patient was started on IVF, IV cefepime and vancomycin and admitted with working diagnosis of HCAP, encephalopathy and dehydration.  The next day, pneumonia or infection felt to be less likely.  Blood cultures negative.  Antibiotic discontinued.  Further encephalopathy workup including ABG, UDS, B1 and B12 unrevealing.  Neurology consulted and she  had lumbar puncture.  CSF studies unrevealing so far.  EEG suggest moderate diffuse encephalopathy but no seizure or epileptiform discharge.  Started on high-dose thiamine without significant improvement.  Family requested transfer to [facility] but both at full capacity.  Fortunately, encephalopathy improved.  GI following for constipation.  Possible flex sigmoidoscopy   Subjective: Seen and examined earlier this morning.  No major events overnight of this morning.  Sitting on bedside chair.  Daughter at bedside.  Daughter concerned about possible blood in her urine.  Purwick canister is empty.  Hemoglobin is stable.  Initial UA was negative for hematuria.  We have already discontinued Lovenox due to thrombocytopenia.  Reports fair oral intake this morning.  She is on full liquid diet per SLP.  Objective: Vitals:   07/07/22 0700 07/07/22 0739 07/07/22 1144 07/07/22 1149  BP: 106/72 112/70 (!) 88/62 (!) 88/66  Pulse: 78 83 (!) 106 (!) 103  Resp: 16 18 18 18   Temp:  (!) 97.3 F (36.3 C) 97.9 F (36.6 C) 97.9 F (36.6 C)  TempSrc:  Oral Axillary Axillary  SpO2: 100% 100% 94% 95%  Weight:        Examination:  GENERAL: No apparent distress.  Sitting on bedside chair. HEENT: MMM.  Vision and hearing grossly intact.  NECK: Supple.  No apparent JVD.  RESP:  No IWOB.  Fair aeration bilaterally. CVS:  RRR. Heart sounds normal.  ABD/GI/GU: BS+. Abd soft, NTND.  MSK/EXT:   No apparent deformity. Moves extremities. No edema.  SKIN: no apparent skin lesion or  wound NEURO: Awake and alert. Oriented to self and family.  Follows some commands.  No apparent focal neuro deficit. PSYCH: Calm. Normal affect.   Procedures:  2/15-lumbar puncture.  Microbiology summarized: [id number], influenza and RSV PCR nonreactive MRSA PCR screen nonreactive Blood cultures NGTD CSF cultures NGTD.  Assessment and plan: Principal Problem:   Acute metabolic encephalopathy Active Problems:   Acute hypoxic  respiratory failure (HCC)   Essential hypertension   Hypercalcemia   Dehydration   Acute prerenal azotemia   Transaminitis   Constipation   DM2 (diabetes mellitus, type 2) (HCC)   Allergic rhinitis   Chronic diastolic CHF (congestive heart failure) (HCC)  Acute metabolic encephalopathy: Initially felt to be dehydration and delirium but mental status worse despite hydration.  Encephalopathy workup including CT head, MRI brain, ammonia, TSH, VBG, UDS, B1, B12, RPR, CSF study and EEG unrevealing.  Delirium remains on differential.  I do not know if this could be catatonia.  Nutrition could play a role.  Fortunately, encephalopathy improved.  Now awake and alert and oriented to self and family. -Reorientation and delirium precaution -Completed high-dose thiamine. -SLP advance diet to dysphagia 1 diet. -Fall and aspiration precaution. -[facility] called for transfer but at capacity. -Neurology available as needed  Vomiting/constipation/possible fecal impaction:  CT without acute finding other than fecal impaction.  RUQ [place] negative. S/p disimpaction in ED. Had multiple loose stools after enema -Appreciate input by GI-enema and bowel regimen -Possible fleck sigmoidoscopy  Chronic diastolic CHF: TTE on 2/2 with LVEF of 50 to 55% and G1-DD.  She was somewhat dehydrated on presentation.  She seems to be on Lasix.  No CHF on chest x-ray. -Decrease IV fluid. -Continue holding Lasix -Strict intake and output, daily weights, renal functions and electrolytes  Chronic hypoxic respiratory failure: On 2 L at baseline.  Currently on room air. -Minimum oxygen to keep saturation above 90%. -Try incentive spirometry and pulmonary toilet  HCAP/pneumonia ruled out: No fever, leukocytosis.  Procalcitonin within normal.  COVID-19, influenza and RSV PCR nonreactive.  MRSA PCR negative.  Full RVP negative.  Blood cultures NGTD.  Cough likely due to silent aspiration in the setting of encephalopathy and  vomiting. -Appreciate input by pulmonology. -Continue aspiration precaution and pulmonary toilet.  Dehydration: Likely due to poor intake and vomiting.  Seems to have resolved with IV hydration. -Decrease IV fluid.  Controlled IDDM-2 with hyperglycemia: A1c 6.9%. -Continue current insulin regimen  Elevated LFT:  CK within normal.  LFT improved.  RUQ [place] negative.  Stable. -Continue monitoring  Hypokalemia/hypophosphatemia -Monitor replenish as appropriate.  Hypercalcemia: Likely secondary due to dehydration.  Resolved with hydration.  Thrombocytopenia: Platelet continues to decline.  HIT score 6.  No evidence of major bleeding although family concerned about blood in urine.  Hemoglobin is stable. -Discontinued Lovenox -Follow HIT lab -SCD for VTE prophylaxis -Check urinalysis  Generalized weakness/lethargy: In the setting of encephalopathy as above. -PT/OT eval as above  Obesity/decreased oral intake: Decreased oral intake likely due to encephalopathy.  Prealbumin 11.  Resistant NG tube insertion on 2/16.  Now encephalopathy resolved.  Diet advanced to dysphagia 1. Body mass index is 33.16 kg/m. Nutrition Problem: Inadequate oral intake Etiology: lethargy/confusion, dysphagia Signs/Symptoms: NPO status (pocketing/holding food) Interventions: Boost Breeze, MVI   DVT prophylaxis:  Place and maintain sequential compression device Start: 07/05/22 0743 SCDs Start: 06/30/22 2019  Code Status: Full code Family Communication: Updated patient's daughters in person and over the phone Level of care: Med-Surg Status is: Inpatient  Remains inpatient appropriate because: Acute metabolic encephalopathy, dehydration and lethargy.   Final disposition: SNF. Consultants:  Neurology Pulmonology Gastroenterology  35 minutes with more than 50% spent in reviewing records, counseling patient/family and coordinating care.   Sch Meds:  Scheduled Meds:  bisacodyl  10 mg Rectal Daily    feeding supplement  1 Container Oral BID BM   fluconazole  150 mg Oral Once   fluticasone  2 spray Each Nare q morning   insulin aspart  0-6 Units Subcutaneous TID WC   metoprolol tartrate  2.5 mg Intravenous Q8H   multivitamin with minerals  1 tablet Oral Daily   nystatin  5 mL Oral QID   polyethylene glycol  17 g Oral BID   [START ON 07/10/2022] thiamine (VITAMIN B1) injection  100 mg Intravenous Daily   Continuous Infusions:  dextrose 5 % and 0.45% NaCl 1,000 mL with potassium chloride 30 mEq infusion 100 mL/hr at 07/07/22 1433   thiamine (VITAMIN B1) injection 250 mg (07/07/22 1019)   PRN Meds:.acetaminophen **OR** acetaminophen, gadobutrol, guaiFENesin, ipratropium-albuterol, menthol-cetylpyridinium, milk and molasses, ondansetron (ZOFRAN) IV  Antimicrobials: Anti-infectives (From admission, onward)    Start     Dose/Rate Route Frequency Ordered Stop   07/04/22 1730  fluconazole (DIFLUCAN) tablet 150 mg        150 mg Oral  Once 07/04/22 1633     07/01/22 2200  vancomycin (VANCOCIN) IVPB 1000 mg/200 mL premix  Status:  Discontinued        1,000 mg 200 mL/hr over 60 Minutes Intravenous Every 24 hours 06/30/22 2136 07/01/22 1047   07/01/22 0800  ceFEPIme (MAXIPIME) 2 g in sodium chloride 0.9 % 100 mL IVPB  Status:  Discontinued        2 g 200 mL/hr over 30 Minutes Intravenous Every 8 hours 07/01/22 0735 07/01/22 1047   06/30/22 2145  ceFEPIme (MAXIPIME) 2 g in sodium chloride 0.9 % 100 mL IVPB  Status:  Discontinued        2 g 200 mL/hr over 30 Minutes Intravenous Every 12 hours 06/30/22 2136 07/01/22 0735   06/30/22 2145  vancomycin (VANCOREADY) IVPB 2000 mg/400 mL        2,000 mg 200 mL/hr over 120 Minutes Intravenous  Once 06/30/22 2136 07/01/22 0204        I have personally reviewed the following labs and images: CBC: Recent Labs  Lab 07/01/22 0420 07/01/22 0830 07/02/22 0834 07/03/22 0742 07/04/22 0810 07/05/22 0433 07/06/22 0420 07/07/22 0317  WBC 9.3  --   8.0 6.4 5.9 7.1 7.0 6.9  NEUTROABS 6.1  --  5.3 3.7  --   --   --   --   HGB 13.1   < > 13.6 13.2 13.2 13.0 12.3 13.2  HCT 40.5   < > 42.8 41.3 41.4 42.4 40.1 40.9  MCV 87.1  --  86.1 87.7 86.8 88.0 89.5 85.9  PLT 223  --  172 132* 101* 88* 78* 72*   < > = values in this interval not displayed.   BMP &GFR Recent Labs  Lab 07/03/22 0742 07/04/22 0810 07/05/22 0433 07/06/22 0420 07/07/22 0317  NA 148* 144 143 141 139  K 3.6 3.5 3.3* 3.6 3.7  CL 111 111 108 109 109  CO2 29 28 30 27 25   GLUCOSE 114* 160* 118* 120* 145*  BUN 17 14 12 11 10   CREATININE 0.82 0.76 0.83 0.75 0.66  CALCIUM 9.8 9.2 9.1 8.5* 8.3*  MG 2.3 2.1 2.1  1.9 1.8  PHOS 2.5 2.0* 3.1 2.2* 2.6   Estimated Creatinine Clearance: 60.3 mL/min (by C-G formula based on SCr of 0.66 mg/dL). Liver & Pancreas: Recent Labs  Lab 07/03/22 0742 07/04/22 0810 07/05/22 0433 07/06/22 0420 07/07/22 0317  AST 58* 62* 68* 70* 60*  ALT 40 40 45* 45* 43  ALKPHOS 64 67 63 62 67  BILITOT 1.1 0.9 0.7 0.5 0.5  PROT 6.0* 5.7* 5.5* 5.0* 5.3*  ALBUMIN 1.9* 1.7* 1.7* <1.5* 1.5*   No results for input(s): "LIPASE", "AMYLASE" in the last 168 hours.  Recent Labs  Lab 06/30/22 1952  AMMONIA 15   Diabetic: No results for input(s): "HGBA1C" in the last 72 hours. Recent Labs  Lab 07/06/22 0757 07/06/22 1205 07/07/22 0416 07/07/22 0740 07/07/22 1144  GLUCAP 129* 137* 160* 122* 120*   Cardiac Enzymes: Recent Labs  Lab 06/30/22 2015 07/06/22 0420 07/07/22 0317  CKTOTAL 21* 30* 31*   No results for input(s): "PROBNP" in the last 8760 hours. Coagulation Profile: Recent Labs  Lab 07/01/22 0505  INR 1.0   Thyroid Function Tests: No results for input(s): "TSH", "T4TOTAL", "FREET4", "T3FREE", "THYROIDAB" in the last 72 hours.  Lipid Profile: No results for input(s): "CHOL", "HDL", "LDLCALC", "TRIG", "CHOLHDL", "LDLDIRECT" in the last 72 hours. Anemia Panel: No results for input(s): "VITAMINB12", "FOLATE", "FERRITIN",  "TIBC", "IRON", "RETICCTPCT" in the last 72 hours.  Urine analysis:    Component Value Date/Time   COLORURINE YELLOW 07/01/2022 0610   APPEARANCEUR CLEAR 07/01/2022 0610   LABSPEC 1.038 (H) 07/01/2022 0610   PHURINE 5.0 07/01/2022 0610   GLUCOSEU NEGATIVE 07/01/2022 0610   HGBUR NEGATIVE 07/01/2022 0610   BILIRUBINUR NEGATIVE 07/01/2022 0610   KETONESUR 5 (A) 07/01/2022 0610   PROTEINUR >=300 (A) 07/01/2022 0610   UROBILINOGEN 0.2 09/15/2011 0530   NITRITE NEGATIVE 07/01/2022 0610   LEUKOCYTESUR NEGATIVE 07/01/2022 0610   Sepsis Labs: Invalid input(s): "PROCALCITONIN", "LACTICIDVEN"  Microbiology: Recent Results (from the past 240 hour(s))  Resp panel by RT-PCR (RSV, Flu A&B, Covid) Anterior Nasal Swab     Status: None   Collection Time: 06/30/22  1:31 PM   Specimen: Anterior Nasal Swab  Result Value Ref Range Status   SARS Coronavirus 2 by RT PCR NEGATIVE NEGATIVE Final   Influenza A by PCR NEGATIVE NEGATIVE Final   Influenza B by PCR NEGATIVE NEGATIVE Final    Comment: (NOTE) The Xpert Xpress SARS-CoV-2/FLU/RSV plus assay is intended as an aid in the diagnosis of influenza from Nasopharyngeal swab specimens and should not be used as a sole basis for treatment. Nasal washings and aspirates are unacceptable for Xpert Xpress SARS-CoV-2/FLU/RSV testing.  Fact Sheet for Patients: EntrepreneurPulse.com.au  Fact Sheet for Healthcare Providers: IncredibleEmployment.be  This test is not yet approved or cleared by the [place] FDA and has been authorized for detection and/or diagnosis of SARS-CoV-2 by FDA under an Emergency Use Authorization (EUA). This EUA will remain in effect (meaning this test can be used) for the duration of the COVID-19 declaration under Section 564(b)(1) of the Act, 21 U.S.C. section 360bbb-3(b)(1), unless the authorization is terminated or revoked.     Resp Syncytial Virus by PCR NEGATIVE NEGATIVE Final     Comment: (NOTE) Fact Sheet for Patients: EntrepreneurPulse.com.au  Fact Sheet for Healthcare Providers: IncredibleEmployment.be  This test is not yet approved or cleared by the [place] FDA and has been authorized for detection and/or diagnosis of SARS-CoV-2 by FDA under an Emergency Use Authorization (EUA). This EUA will remain  in effect (meaning this test can be used) for the duration of the COVID-19 declaration under Section 564(b)(1) of the Act, 21 U.S.C. section 360bbb-3(b)(1), unless the authorization is terminated or revoked.  Performed at [facility], [place] [address] [place], [place] [address]   Culture, blood (routine x 2)     Status: None   Collection Time: 06/30/22  7:52 PM   Specimen: BLOOD  Result Value Ref Range Status   Specimen Description BLOOD LEFT ANTECUBITAL  Final   Special Requests   Final    BOTTLES DRAWN AEROBIC AND ANAEROBIC Blood Culture adequate volume   Culture   Final    NO GROWTH 5 DAYS Performed at [facility], [place] [address] [place], [place] [address]    Report Status 07/05/2022 FINAL  Final  Culture, blood (routine x 2)     Status: None   Collection Time: 06/30/22  8:28 PM   Specimen: BLOOD LEFT HAND  Result Value Ref Range Status   Specimen Description BLOOD LEFT HAND  Final   Special Requests   Final    BOTTLES DRAWN AEROBIC AND ANAEROBIC Blood Culture adequate volume   Culture   Final    NO GROWTH 5 DAYS Performed at [facility], [place] [address], [place], [place] [address]    Report Status 07/05/2022 FINAL  Final  MRSA Next Gen by PCR, Nasal     Status: None   Collection Time: 06/30/22 11:21 PM  Result Value Ref Range Status   MRSA by PCR Next Gen NOT DETECTED NOT DETECTED Final    Comment: (NOTE) The GeneXpert MRSA Assay (FDA approved for NASAL specimens only), is one component of a comprehensive MRSA colonization surveillance program. It is not intended to diagnose MRSA  infection nor to guide or monitor treatment for MRSA infections. Test performance is not FDA approved in patients less than 19 years old. Performed at [facility], [place] [address] [place], [place] [address]   CSF culture w Gram Stain     Status: None   Collection Time: 07/02/22  4:46 PM   Specimen: CSF; Cerebrospinal Fluid  Result Value Ref Range Status   Specimen Description CSF  Final   Special Requests LP  Final   Gram Stain NO WBC SEEN NO ORGANISMS SEEN CYTOSPIN SMEAR   Final   Culture   Final    NO GROWTH 3 DAYS Performed at [facility], [place] [address], [place], [place] [address]    Report Status 07/06/2022 FINAL  Final  Respiratory (~20 pathogens) panel by PCR     Status: None   Collection Time: 07/02/22  6:00 PM   Specimen: Nasopharyngeal Swab; Respiratory  Result Value Ref Range Status   Adenovirus NOT DETECTED NOT DETECTED Final   Coronavirus 229E NOT DETECTED NOT DETECTED Final    Comment: (NOTE) The Coronavirus on the Respiratory Panel, DOES NOT test for the novel  Coronavirus (2019 nCoV)    Coronavirus HKU1 NOT DETECTED NOT DETECTED Final   Coronavirus NL63 NOT DETECTED NOT DETECTED Final   Coronavirus OC43 NOT DETECTED NOT DETECTED Final   Metapneumovirus NOT DETECTED NOT DETECTED Final   Rhinovirus / Enterovirus NOT DETECTED NOT DETECTED Final   Influenza A NOT DETECTED NOT DETECTED Final   Influenza B NOT DETECTED NOT DETECTED Final   Parainfluenza Virus 1 NOT DETECTED NOT DETECTED Final   Parainfluenza Virus 2 NOT DETECTED NOT DETECTED Final   Parainfluenza Virus 3 NOT  DETECTED NOT DETECTED Final   Parainfluenza Virus 4 NOT DETECTED NOT DETECTED Final   Respiratory Syncytial Virus NOT DETECTED NOT DETECTED Final   Bordetella pertussis NOT DETECTED NOT DETECTED Final   Bordetella Parapertussis NOT DETECTED NOT DETECTED Final   Chlamydophila pneumoniae NOT DETECTED NOT DETECTED Final   Mycoplasma pneumoniae NOT DETECTED NOT DETECTED Final     Comment: Performed at [facility], [place] [address], [place],  [address]    Radiology Studies: DG Abd 2 Views  Result Date: 07/07/2022 CLINICAL DATA:  Abdominal pain. EXAM: ABDOMEN - 2 VIEW COMPARISON:  07/05/2022 FINDINGS: The bowel gas pattern is normal without gaseous distention. No radio-opaque calculi or other significant radiographic abnormality are seen. Increased stool noted consistent with constipation. IMPRESSION: Constipation. Electronically Signed   By: Sammie Bench M.D.   On: 07/07/2022 07:51      Orlando Thalmann T. Riley  If 7PM-7AM, please contact night-coverage [email or web address] 07/07/2022, 2:50 PM

## 2022-07-07 NOTE — Progress Notes (Signed)
Occupational Therapy Treatment Patient Details Name: Barbara Nelson MRN: [id number] DOB: 1941/03/28 Today's Date: 07/07/2022   History of present illness Pt is an 82 y/o female admitted for AMS in setting of HCAP PNA. Also noted dehydration and fecal impaction. Recent admission 2/1-2/10 for same. PMH: DM2, HTN, CHF.   OT comments  Pt with incremental progress towards OT goals, more alert during session though continues to be limited by deficits in cognition, endurance and strength. Overall, pt required Max A x 2 for bed mobility and transfer to chair via Stedy. Multiple cues needed for posture and command following. [name] daughter present during session, inquired tasks to work on with pt during the day. Educated on AROM of BUE/LE, encouragement to complete basic UB ADLs w/ decreased assist within pt tolerance. Continue to rec SNF rehab.  SpO2 94% on RA   Recommendations for follow up therapy are one component of a multi-disciplinary discharge planning process, led by the attending physician.  Recommendations may be updated based on patient status, additional functional criteria and insurance authorization.    Follow Up Recommendations  Skilled nursing-short term rehab (<3 hours/day)     Assistance Recommended at Discharge Frequent or constant Supervision/Assistance  Patient can return home with the following  Two people to help with walking and/or transfers;Two people to help with bathing/dressing/bathroom   Equipment Recommendations  Wheelchair (measurements OT);Wheelchair cushion (measurements OT)    Recommendations for Other Services      Precautions / Restrictions Precautions Precautions: Fall;Other (comment) Precaution Comments: monitor O2 Restrictions Weight Bearing Restrictions: No       Mobility Bed Mobility Overal bed mobility: Needs Assistance Bed Mobility: Supine to Sit     Supine to sit: Max assist, +2 for physical assistance, +2 for safety/equipment, HOB  elevated     General bed mobility comments: poor initiation of BLE to EOB, cues to use bedrail with inconsistent follow through    Transfers Overall transfer level: Needs assistance Equipment used: Ambulation equipment used Transfers: Sit to/from Stand, Bed to chair/wheelchair/BSC Sit to Stand: Max assist, +2 physical assistance, +2 safety/equipment, From elevated surface           General transfer comment: Max A x 2 to stand in [place] with use of bed pads to lift bottom. Transferred to chair via Geophysical data processor: [employer] Overall balance assessment: Needs assistance Sitting-balance support: Bilateral upper extremity supported, Feet supported Sitting balance-Leahy Scale: Poor Sitting balance - Comments: intermittent min guard to Min A for balance. posterior bias and able to correct > 75% of the time w/ cues Postural control: Posterior lean Standing balance support: Bilateral upper extremity supported, Reliant on assistive device for balance, During functional activity Standing balance-Leahy Scale: Zero                             ADL either performed or assessed with clinical judgement   ADL Overall ADL's : Needs assistance/impaired Eating/Feeding: Maximal assistance;Sitting Eating/Feeding Details (indicate cue type and reason): daughter assisting pt with drinking from cup                 Lower Body Dressing: Total assistance;+2 for physical assistance;+2 for safety/equipment;Bed level Lower Body Dressing Details (indicate cue type and reason): Total A for donning socks/tennis shoes. cues for pt to push heel down to assist though inconsistent               General ADL Comments:  Improving alertness though still significantly weak    Extremity/Trunk Assessment Upper Extremity Assessment Upper Extremity Assessment: Generalized weakness   Lower Extremity Assessment Lower Extremity Assessment: Defer to PT evaluation         Vision   Vision Assessment?: No apparent visual deficits   Perception     Praxis      Cognition Arousal/Alertness: Awake/alert Behavior During Therapy: Flat affect Overall Cognitive Status: Impaired/Different from baseline Area of Impairment: Memory, Following commands, Problem solving, Attention, Safety/judgement, Awareness                   Current Attention Level: Sustained Memory: Decreased short-term memory Following Commands: Follows one step commands inconsistently, Follows one step commands with increased time Safety/Judgement: Decreased awareness of safety, Decreased awareness of deficits Awareness: Intellectual Problem Solving: Slow processing, Decreased initiation, Requires verbal cues, Difficulty sequencing General Comments: more alert, eyes open during session. inconsistent following of commands and multimodal cues needed. at times, pt not answering therapist's questions, etc        Exercises      Shoulder Instructions       General Comments Daughter present. SpO2 94% on RA    Pertinent Vitals/ Pain       Pain Assessment Pain Assessment: No/denies pain  Home Living                                          Prior Functioning/Environment              Frequency  Min 2X/week        Progress Toward Goals  OT Goals(current goals can now be found in the care plan section)  Progress towards OT goals: Progressing toward goals  Acute Rehab OT Goals Patient Stated Goal: none stated OT Goal Formulation: With patient Time For Goal Achievement: 07/16/22 Potential to Achieve Goals: Fair ADL Goals Pt Will Perform Upper Body Bathing: with modified independence;sitting Pt Will Perform Lower Body Bathing: with mod assist;sit to/from stand Pt Will Perform Lower Body Dressing: with set-up;with adaptive equipment;sit to/from stand;with supervision Pt Will Transfer to Toilet: with mod assist;stand pivot transfer;bedside  commode Additional ADL Goal #1: Pt to maintain sitting balance > 5 min EOB during functional tasks with no more than min guard  Plan Discharge plan remains appropriate    Co-evaluation                 AM-PAC OT "6 Clicks" Daily Activity     Outcome Measure   Help from another person eating meals?: A Lot Help from another person taking care of personal grooming?: A Lot Help from another person toileting, which includes using toliet, bedpan, or urinal?: Total Help from another person bathing (including washing, rinsing, drying)?: Total Help from another person to put on and taking off regular upper body clothing?: Total Help from another person to put on and taking off regular lower body clothing?: Total 6 Click Score: 8    End of Session Equipment Utilized During Treatment: Gait belt  OT Visit Diagnosis: Unsteadiness on feet (R26.81);Muscle weakness (generalized) (M62.81);Other abnormalities of gait and mobility (R26.89);Other symptoms and signs involving cognitive function   Activity Tolerance Patient tolerated treatment well   Patient Left in chair;with call bell/phone within reach;with chair alarm set;with family/visitor present;with nursing/sitter in room   Nurse Communication Mobility status        Time: 1610-9604 OT  Time Calculation (min): 27 min  Charges: OT General Charges $OT Visit: 1 Visit OT Treatments $Self Care/Home Management : 8-22 mins $Therapeutic Activity: 8-22 mins  Malachy Chamber, OTR/L Acute Rehab Services Office: [phone]   Layla Maw 07/07/2022, 10:48 AM

## 2022-07-07 NOTE — Progress Notes (Signed)
Speech Language Pathology Treatment: Dysphagia  Patient Details Name: Barbara Nelson MRN: [id number] DOB: 1941/02/03 Today's Date: 07/07/2022 Time: 1040-1100 SLP Time Calculation (min) (ACUTE ONLY): 20 min  Assessment / Plan / Recommendation Clinical Impression  Patient seen by SLP for skilled treatment focused on dysphagia goals. Patient's daughter in room as well. When SLP arrived, patient in recliner chair with eyes closed but she opened eyes when daughter requested. SLP observed her with PO's of thin liquids via straw sips and mechanical soft solids (graham cracker with applesauce). Patient required cues for alertness and to initiate holding and self-feeding. Oral phase was delayed with slow mastication of solids and delayed transit of liquids when drinking through straw. Swallow initiation appeared timely and no overt s/s aspiration or penetration. SLP then discussed with daughter advancement options of Dys 1 versus Dys 2. Daughter and SLP in agreement to upgrade to Dys 1 (puree) solids and plan to continue upgrading if patient tolerating and able to maintain adequate alertness.    HPI HPI: Pt is a 82 y.o. female who presented with AMS and was admitted with acute metabolic encephalopathy in the setting of HCAP pneumonia. CXR 2/13: Minimal strandy and hazy opacities in both lung bases may  represent atelectasis or infection. PMH: type 2 diabetes mellitus, essential hypertension, chronic diastolic heart failure, chronic hypoxic respiratory failure. BSE 06/20/22 regular texture diet with thin liquids recommended.      SLP Plan  Continue with current plan of care      Recommendations for follow up therapy are one component of a multi-disciplinary discharge planning process, led by the attending physician.  Recommendations may be updated based on patient status, additional functional criteria and insurance authorization.    Recommendations  Diet recommendations: Dysphagia 1 (puree) Liquids  provided via: Straw Medication Administration: Crushed with puree Supervision: Staff to assist with self feeding;Full supervision/cueing for compensatory strategies Compensations: Slow rate;Small sips/bites;Minimize environmental distractions Postural Changes and/or Swallow Maneuvers: Seated upright 90 degrees                Oral Care Recommendations: Oral care BID Follow Up Recommendations: Skilled nursing-short term rehab (<3 hours/day) SLP Visit Diagnosis: Dysphagia, unspecified (R13.10) Plan: Continue with current plan of care           Sonia Baller, MA, CCC-SLP Speech Therapy

## 2022-07-07 NOTE — Progress Notes (Signed)
MEWS Progress Note  Patient Details Name: Barbara Nelson MRN: [id number] DOB: [birth year] Today's Date: 07/07/2022   MEWS Flowsheet Documentation:  Assess: MEWS Score Temp: 97.9 F (36.6 C) BP: (!) 88/66 MAP (mmHg): 73 Pulse Rate: (!) 103 ECG Heart Rate: 84 Resp: 18 Level of Consciousness: Alert SpO2: 95 % O2 Device: Nasal Cannula Patient Activity (if Appropriate): In bed O2 Flow Rate (L/min): 2 L/min FiO2 (%): 28 % Assess: MEWS Score MEWS Temp: 0 MEWS Systolic: 1 MEWS Pulse: 1 MEWS RR: 0 MEWS LOC: 0 MEWS Score: 2 MEWS Score Color: Yellow Assess: SIRS CRITERIA SIRS Temperature : 0 SIRS Respirations : 0 SIRS Pulse: 1 SIRS WBC: 0 SIRS Score Sum : 1 SIRS Temperature : 0 SIRS Pulse: 0 SIRS Respirations : 0 SIRS WBC: 0 SIRS Score Sum : 0 Assess: if the MEWS score is Yellow or Red Were vital signs taken at a resting state?: Yes Focused Assessment: Change from prior assessment (see assessment flowsheet) Does the patient meet 2 or more of the SIRS criteria?: No Provider and Rapid Response Notified?: No MEWS guidelines implemented : Yes, yellow Treat MEWS Interventions: Considered administering scheduled or prn medications/treatments as ordered Take Vital Signs Increase Vital Sign Frequency : Yellow: Q2hr x1, continue Q4hrs until patient remains green for 12hrs Escalate MEWS: Escalate: Yellow: Discuss with charge nurse and consider notifying provider and/or RRT Notify: Charge Nurse/RN Name of Charge Nurse/RN Notified: Education officer, museum Provider Notification Provider Name/Title: Wendee Beavers MD Date Provider Notified: 07/04/22 Time Provider Notified: [id number] Method of Notification: Page Notification Reason: Requested by patient/family Provider response: See new orders Date of Provider Response: 07/04/22      Larina Bras 07/07/2022, 11:53 AM

## 2022-07-07 NOTE — Progress Notes (Signed)
[facility] Gastroenterology Progress Note  Barbara Nelson 82 y.o. 1941/04/28  CC:  encephalopathy, fecal impaction   Subjective: Patient seen and examined at bedside.  Family at bedside.  Currently having few loose stools per day.  Denies abdominal pain, nausea or vomiting.  Rectal discomfort has improved.  ROS : afebrile, negative for chest pain   Objective: Vital signs in last 24 hours: Vitals:   07/07/22 1144 07/07/22 1149  BP: (!) 88/62 (!) 88/66  Pulse: (!) 106 (!) 103  Resp: 18 18  Temp: 97.9 F (36.6 C) 97.9 F (36.6 C)  SpO2: 94% 95%    Physical Exam:  Elderly patient, not in acute distress.  More alert today.  Abdominal exam benign.  Bowel Sounds present.  No peritoneal signs.  Lab Results: Recent Labs    07/06/22 0420 07/07/22 0317  NA 141 139  K 3.6 3.7  CL 109 109  CO2 27 25  GLUCOSE 120* 145*  BUN 11 10  CREATININE 0.75 0.66  CALCIUM 8.5* 8.3*  MG 1.9 1.8  PHOS 2.2* 2.6   Recent Labs    07/06/22 0420 07/07/22 0317  AST 70* 60*  ALT 45* 43  ALKPHOS 62 67  BILITOT 0.5 0.5  PROT 5.0* 5.3*  ALBUMIN <1.5* 1.5*   Recent Labs    07/06/22 0420 07/07/22 0317  WBC 7.0 6.9  HGB 12.3 13.2  HCT 40.1 40.9  MCV 89.5 85.9  PLT 78* 72*   No results for input(s): "LABPROT", "INR" in the last 72 hours.    Assessment/Plan: -Constipation and fecal impaction.  Improving -Nausea and vomiting.  Improving. -Encephalopathy unknown etiology.  Neurology following.  Currently n.p.o. status per speech recommendations. -Elevated LFTs.-likely transient.  CT and ultrasound showed normal-appearing liver. -Thrombocytopenia.   Recommendations -------------------------- -Repeat x-ray showed ongoing constipation.  One more Fleet enema today.  - She may need flexible sigmoidoscopy for fecal disimpaction which may need to done under sedation to avoid significant rectal discomfort.  Discussed with patient daughter at bedside.  If no improvement tomorrow, we can  consider flexible sigmoidoscopy on Thursday. -Topical 5% lidocaine for rectal discomfort 3 times a day. -Continue MiraLAX twice a day -GI will follow     Otis Brace MD, [place] 07/07/2022, 3:10 PM  Contact #  [phone]

## 2022-07-07 NOTE — Progress Notes (Signed)
Nutrition Follow-up  DOCUMENTATION CODES:   Not applicable  INTERVENTION:   Multivitamin w/ minerals daily Boost Breeze po BID, each supplement provides 250 kcal and 9 grams of protein Feeding assist with all meals Meal ordering with assist  NUTRITION DIAGNOSIS:   Inadequate oral intake related to lethargy/confusion, dysphagia as evidenced by NPO status (pocketing/holding food). - Progressing, now on diet  GOAL:   Patient will meet greater than or equal to 90% of their needs - Ongoing  MONITOR:   PO intake, Supplement acceptance, Labs, I & O's  REASON FOR ASSESSMENT:   Consult Assessment of nutrition requirement/status  ASSESSMENT:   82 yo female admitted with acute metabolic encephalopathy with dehydration, vomiting likely secondary to fecal impaction. PMH include chronic CHF, chronic respiratory failure, controlled DM 2  2/13 - Admitted 2/16 - NPO 2/17 - diet advanced to clear liquids 2/19 - diet advanced to full liquids 2/20 - diet advanced to Dysphagia 1  Pt sleeping in chair at time of visit, family at bedside. Pt diet was just advanced prior to RD entering. Reports that pt has been very lethargic and was not eating well at [facility]. States that the family was having to feed her, but she was still pretty lethargic. States that her mentation had improved and starting to self feed. Family endorses some weight loss from December as well but did not provide an amount. Discussed the use of oral nutrition supplements when PO is not great, family agreeable to trial of Boost Breeze since pt does not like milk many milk products. Reviewed letting pt just sip on supplement during the day between meals to add additional calories and protein.   Medications reviewed and include: Doculax, Diflucan, NovoLog SSI, Miralax, Thiamine, D5 Labs reviewed: Sodium 139, Potassium 3.7, Phosphorus 2.6, Magnesium 1.8, 24 hr CBGs 120-160  NUTRITION - FOCUSED PHYSICAL EXAM:  Flowsheet Row  Most Recent Value  Orbital Region No depletion  Upper Arm Region No depletion  Thoracic and Lumbar Region No depletion  Buccal Region No depletion  Temple Region No depletion  Clavicle Bone Region Mild depletion  Clavicle and Acromion Bone Region Mild depletion  Scapular Bone Region Mild depletion  Dorsal Hand No depletion  Patellar Region No depletion  Anterior Thigh Region No depletion  Posterior Calf Region Mild depletion  Edema (RD Assessment) Mild  Hair Reviewed  Eyes Unable to assess  Mouth Unable to assess  Skin Reviewed  Nails Reviewed   Diet Order:   Diet Order             DIET - DYS 1 Room service appropriate? Yes with Assist; Fluid consistency: Thin  Diet effective now                   EDUCATION NEEDS:   Not appropriate for education at this time  Skin:  Skin Assessment: Skin Integrity Issues: Skin Integrity Issues:: Other (Comment) Other: wound to right buttocks (no other details or staging documented at this time)  Last BM:  2/20 - Type 5  Height:  Ht Readings from Last 1 Encounters:  06/18/22 5\' 5"  (1.651 m)   Weight:  Wt Readings from Last 1 Encounters:  07/06/22 90.4 kg   Ideal Body Weight:  56.8 kg  BMI:  Body mass index is 33.16 kg/m.  Estimated Nutritional Needs:  Kcal:  1550-1750 kcals Protein:  75-85 g Fluid:  >/= 1.5 L   Hermina Barters RD, LDN Clinical Dietitian See [facility] for contact information.

## 2022-07-07 NOTE — Progress Notes (Signed)
MEWS Progress Note  Patient Details Name: Barbara Nelson MRN: [id number] DOB: [birth year] Today's Date: 07/07/2022   MEWS Flowsheet Documentation:  Assess: MEWS Score Temp: 97.9 F (36.6 C) BP: (!) 88/66 MAP (mmHg): 73 Pulse Rate: (!) 103 ECG Heart Rate: 84 Resp: 18 Level of Consciousness: Alert SpO2: 95 % O2 Device: Nasal Cannula Patient Activity (if Appropriate): In bed O2 Flow Rate (L/min): 2 L/min FiO2 (%): 28 % Assess: MEWS Score MEWS Temp: 0 MEWS Systolic: 1 MEWS Pulse: 1 MEWS RR: 0 MEWS LOC: 0 MEWS Score: 2 MEWS Score Color: Yellow Assess: SIRS CRITERIA SIRS Temperature : 0 SIRS Respirations : 0 SIRS Pulse: 1 SIRS WBC: 0 SIRS Score Sum : 1 SIRS Temperature : 0 SIRS Pulse: 0 SIRS Respirations : 0 SIRS WBC: 0 SIRS Score Sum : 0 Assess: if the MEWS score is Yellow or Red Were vital signs taken at a resting state?: Yes Focused Assessment: Change from prior assessment (see assessment flowsheet) Does the patient meet 2 or more of the SIRS criteria?: No Provider and Rapid Response Notified?: Yes MEWS guidelines implemented : Yes, yellow Treat MEWS Interventions: Considered administering scheduled or prn medications/treatments as ordered Take Vital Signs Increase Vital Sign Frequency : Yellow: Q2hr x1, continue Q4hrs until patient remains green for 12hrs Escalate MEWS: Escalate: Yellow: Discuss with charge nurse and consider notifying provider and/or RRT Notify: Charge Nurse/RN Name of Charge Nurse/RN Notified: john Provider Notification Provider Name/Title: Cyndia Skeeters Date Provider Notified: 07/07/22 Time Provider Notified: [id number] Method of Notification: Call Notification Reason: Change in status Provider response: See new orders Date of Provider Response: 07/04/22      Larina Bras 07/07/2022, 11:56 AM

## 2022-07-08 ENCOUNTER — Inpatient Hospital Stay (HOSPITAL_COMMUNITY): Payer: Medicare Other

## 2022-07-08 DIAGNOSIS — J9601 Acute respiratory failure with hypoxia: Secondary | ICD-10-CM

## 2022-07-08 DIAGNOSIS — K5900 Constipation, unspecified: Secondary | ICD-10-CM | POA: Diagnosis not present

## 2022-07-08 DIAGNOSIS — G9341 Metabolic encephalopathy: Secondary | ICD-10-CM | POA: Diagnosis not present

## 2022-07-08 LAB — COMPREHENSIVE METABOLIC PANEL
ALT: 45 U/L — ABNORMAL HIGH (ref 0–44)
AST: 57 U/L — ABNORMAL HIGH (ref 15–41)
Albumin: 1.5 g/dL — ABNORMAL LOW (ref 3.5–5.0)
Alkaline Phosphatase: 71 U/L (ref 38–126)
Anion gap: 5 (ref 5–15)
BUN: 7 mg/dL — ABNORMAL LOW (ref 8–23)
CO2: 25 mmol/L (ref 22–32)
Calcium: 8.1 mg/dL — ABNORMAL LOW (ref 8.9–10.3)
Chloride: 106 mmol/L (ref 98–111)
Creatinine, Ser: 0.74 mg/dL (ref 0.44–1.00)
GFR, Estimated: >60 mL/min (ref >60)
Glucose, Bld: 142 mg/dL — ABNORMAL HIGH (ref 70–99)
Potassium: 3.7 mmol/L (ref 3.5–5.1)
Sodium: 136 mmol/L (ref 135–145)
Total Bilirubin: 0.8 mg/dL (ref 0.3–1.2)
Total Protein: 5.1 g/dL — ABNORMAL LOW (ref 6.5–8.1)

## 2022-07-08 LAB — CBC
HCT: 37.6 % (ref 36.0–46.0)
Hemoglobin: 12 g/dL (ref 12.0–15.0)
MCH: 27.5 pg (ref 26.0–34.0)
MCHC: 31.9 g/dL (ref 30.0–36.0)
MCV: 86 fL (ref 80.0–100.0)
Platelets: 71 10*3/uL — ABNORMAL LOW (ref 150–400)
RBC: 4.37 MIL/uL (ref 3.87–5.11)
RDW: 15.4 % (ref 11.5–15.5)
WBC: 6.9 10*3/uL (ref 4.0–10.5)
nRBC: 0 % (ref 0.0–0.2)

## 2022-07-08 LAB — MAGNESIUM: Magnesium: 1.9 mg/dL (ref 1.7–2.4)

## 2022-07-08 LAB — GLUCOSE, CAPILLARY
Glucose-Capillary: 164 mg/dL — ABNORMAL HIGH (ref 70–99)
Glucose-Capillary: 102 mg/dL — ABNORMAL HIGH (ref 70–99)
Glucose-Capillary: 100 mg/dL — ABNORMAL HIGH (ref 70–99)

## 2022-07-08 LAB — PHOSPHORUS: Phosphorus: 2.1 mg/dL — ABNORMAL LOW (ref 2.5–4.6)

## 2022-07-08 MED ORDER — BISACODYL 10 MG RE SUPP: 10.0000 mg | Freq: Every day | RECTAL | Status: DC | PRN

## 2022-07-08 MED ORDER — MAGNESIUM CITRATE PO SOLN
1.0000 | Freq: Once | ORAL | Status: DC
  Filled 2022-07-08: qty 296

## 2022-07-08 NOTE — Progress Notes (Signed)
Mobility Specialist Progress Note    07/08/22 1620  Mobility  Activity Transferred from chair to bed  Level of Assistance Maximum assist, patient does 25-49%  Assistive Device Stedy  Activity Response Tolerated fair  Mobility Referral Yes  $Mobility charge 1 Mobility   Pre Mobility: 95 HR During Mobility: 123 HR Post Mobility: 108 HR  Pt requesting assistance to get from chair to bed d/t increased fatigue. Required MaxA to stand + tactile cues to get pts hips extended into stedy. X1 STS w/ MaxA to get back in bed pt generally weak throughout. Once in bed, pt fatigued but agreeable to bed level exercises. Able to tolerate x3 exercises before requesting to rest. Left w/ call bell in reach in bed alarm on.   Holland Falling Mobility Specialist Please contact via SecureChat or  Rehab office at [phone]

## 2022-07-08 NOTE — Progress Notes (Signed)
CPT held at this time as pt is nauseous and vomiting. MD at bedside and aware.

## 2022-07-08 NOTE — Progress Notes (Signed)
PROGRESS NOTE  Barbara Nelson BUL:[id number] DOB: 1941/01/23   PCP: Cari Caraway, MD  Patient is from: SNF  DOA: 06/30/2022 LOS: 8  Chief complaints Chief Complaint  Patient presents with   Fatigue   Abdominal Pain   Emesis     Brief Narrative / Interim history:  82 year old F with PMH of diastolic CHF, chronic hypoxic RF on 2 L, HTN, and recent hospitalization from 2/1-10 with CAP and acute on chronic CHF returning with altered mental status, lethargy and vomiting, and admitted with working diagnosis of HCAP and encephalopathy.  Family denies history of dementia or cognitive impairment.  They significant decline in cognition since she was diagnosed with shingles in January and started on gabapentin.  She was weaned off gabapentin but she continued to decline, even worse after her recent hospitalization.  She is currently not on any sedating medication.  In ED, slightly hypertensive.  Not febrile.  Saturating in upper 90s to 100 on 2 L.  No leukocytosis.  Hgb 15.2 (12.4 about 12 days ago).  Slightly elevated LFT and Cr.  Lipase, BNP, ammonia, TSH, CK and procalcitonin within normal.  Lactic acid 2.1.  VBG suggess mild metabolic alkalosis.  UDS negative.  UA with > 300 protein.  XR with minimal strandy and hazy opacity at both lung bases suggesting atelectasis or infection, small left pleural effusion and stable cardiomegaly.  CT abdomen and pelvis with contrast showed large amount of stool in the rectum concerning for impaction.  She had disimpaction in ED.  CT head without acute finding.  MRI brain without acute finding but limited by motion.  Patient was started on IVF, IV cefepime and vancomycin and admitted with working diagnosis of HCAP, encephalopathy and dehydration.  The next day, pneumonia or infection felt to be less likely.  Blood cultures negative.  Antibiotic discontinued.  Further encephalopathy workup including ABG, UDS, B1 and B12 unrevealing.  Neurology consulted and she  had lumbar puncture.  CSF studies unrevealing so far.  EEG suggest moderate diffuse encephalopathy but no seizure or epileptiform discharge.  Started on high-dose thiamine without significant improvement.  Family requested transfer to [place] but both at full capacity.  Fortunately, encephalopathy improved.  GI following for constipation.  Possible flex sigmoidoscopy   Subjective:  Patient herself unable to provide reliable complaints today, daughter at bedside report patient had episode of vomiting earlier today, and she was concerned about dark-colored urine and NG tube has been discontinued, and she is currently on DYS 1 diet .  Objective: Vitals:   07/07/22 2340 07/08/22 0350 07/08/22 0500 07/08/22 0516  BP: 136/81 (!) 152/75  118/67  Pulse: 81 63  99  Resp: 17 15    Temp: 98.1 F (36.7 C) 98 F (36.7 C)  98.7 F (37.1 C)  TempSrc: Axillary Axillary  Oral  SpO2: 94% 94%  92%  Weight:   96.2 kg     Examination:  GENERAL: No apparent distress.  Sitting on bedside chair. HEENT: MMM.  Vision and hearing grossly intact.  NECK: Supple.  No apparent JVD.  RESP:  No IWOB.  Fair aeration bilaterally. CVS:  RRR. Heart sounds normal.  ABD/GI/GU: BS+. Abd soft, NTND.  MSK/EXT:   No apparent deformity. Moves extremities. No edema.  SKIN: no apparent skin lesion or wound NEURO: Awake and alert. Oriented to self and family.  Follows some commands.  No apparent focal neuro deficit. PSYCH: Calm. Normal affect.   Patient is awake, frail, no apparent distress,  generalized weakness, but no focal deficits, follow simple commands. Symmetrical Chest wall movement, Good air movement bilaterally, CTAB RRR,No Gallops,Rubs or new Murmurs, No Parasternal Heave +ve B.Sounds, Abd Soft, No tenderness, No rebound - guarding or rigidity. No Cyanosis, Clubbing or edema, No new Rash or bruise     Procedures:  2/15-lumbar puncture.  Microbiology summarized: [id number], influenza and RSV PCR  nonreactive MRSA PCR screen nonreactive Blood cultures NGTD CSF cultures NGTD.  Assessment and plan: Principal Problem:   Acute metabolic encephalopathy Active Problems:   Acute hypoxic respiratory failure (HCC)   Essential hypertension   Hypercalcemia   Dehydration   Acute prerenal azotemia   Transaminitis   Constipation   DM2 (diabetes mellitus, type 2) (HCC)   Allergic rhinitis   Chronic diastolic CHF (congestive heart failure) (HCC)  Acute metabolic encephalopathy:  - Initially felt to be dehydration and delirium but mental status worse despite hydration.  - Encephalopathy workup including CT head, MRI brain, ammonia, TSH, VBG, UDS, B1, B12, RPR, CSF study and EEG unrevealing.   - Now awake and alert and oriented to self and family. -Reorientation and delirium precaution -Completed high-dose thiamine. -SLP advance diet to dysphagia 1 diet. -Fall and aspiration precaution. -[facility] called for transfer but at capacity. -Neurology available as needed  Vomiting/constipation/possible fecal impaction:  -  CT without acute finding other than fecal impaction.  RUQ [place] negative. S/p disimpaction in ED. Had multiple loose stools after enema -Appreciate input by GI-enema and bowel regimen -Possible fleck sigmoidoscopy  Chronic diastolic CHF:  - TTE on 2/2 with LVEF of 50 to 55% and G1-DD.  She was somewhat dehydrated on presentation.  She seems to be on Lasix.  No CHF on chest x-ray. -Decrease IV fluid. -Continue holding Lasix -Strict intake and output, daily weights, renal functions and electrolytes  Chronic hypoxic respiratory failure:  -On 2 L at baseline.  Currently on room air. -Minimum oxygen to keep saturation above 90%. -Try incentive spirometry and pulmonary toilet.  HCAP/pneumonia ruled out:  - No fever, leukocytosis.  Procalcitonin within normal.  COVID-19, influenza and RSV PCR nonreactive.  MRSA PCR negative.  Full RVP negative.  Blood cultures NGTD.  Cough  likely due to silent aspiration in the setting of encephalopathy and vomiting. -Appreciate input by pulmonology. -Continue aspiration precaution and pulmonary toilet.  Dehydration: Likely due to poor intake and vomiting.  Seems to have resolved with IV hydration. -Decrease IV fluid.  Controlled IDDM-2 with hyperglycemia: A1c 6.9%. -Continue current insulin regimen  Elevated LFT:  CK within normal.  LFT improved.  RUQ [place] negative.  Stable. -Continue monitoring  Hypokalemia/hypophosphatemia -Monitor replenish as appropriate.  Hypercalcemia: Likely secondary due to dehydration.  Resolved with hydration.  Thrombocytopenia:  - Platelet continues to decline.  -Discontinued Lovenox -Follow HIT lab -SCD for VTE prophylaxis  Generalized weakness/lethargy: In the setting of encephalopathy as above. -PT/OT eval as above  Obesity/decreased oral intake: Decreased oral intake likely due to encephalopathy.  Prealbumin 11.  Resistant NG tube insertion on 2/16.  Now encephalopathy resolved.  Diet advanced to dysphagia 1. Body mass index is 35.29 kg/m. Nutrition Problem: Inadequate oral intake Etiology: lethargy/confusion, dysphagia Signs/Symptoms: NPO status (pocketing/holding food) Interventions: Boost Breeze, MVI   DVT prophylaxis:  Place and maintain sequential compression device Start: 07/05/22 0743 SCDs Start: 06/30/22 2019  Code Status: Full code Family Communication: D/W daughter at bedside Level of care: Med-Surg Status is: Inpatient Remains inpatient appropriate because: Acute metabolic encephalopathy, dehydration and lethargy.  Final disposition: SNF. Consultants:  Neurology Pulmonology Gastroenterology  35 minutes with more than 50% spent in reviewing records, counseling patient/family and coordinating care.   Sch Meds:  Scheduled Meds:  bisacodyl  10 mg Rectal Daily   feeding supplement  1 Container Oral BID BM   fluconazole  150 mg Oral Once   fluticasone  2  spray Each Nare q morning   insulin aspart  0-6 Units Subcutaneous TID WC   lidocaine   Topical TID   magnesium citrate  1 Bottle Oral Once   metoprolol tartrate  2.5 mg Intravenous Q8H   multivitamin with minerals  1 tablet Oral Daily   nystatin  5 mL Oral QID   polyethylene glycol  17 g Oral BID   [START ON 07/10/2022] thiamine (VITAMIN B1) injection  100 mg Intravenous Daily   Continuous Infusions:  dextrose 5 % and 0.45% NaCl 1,000 mL with potassium chloride 30 mEq infusion 60 mL/hr at 07/08/22 0616   thiamine (VITAMIN B1) injection 250 mg (07/07/22 1019)   PRN Meds:.acetaminophen **OR** acetaminophen, bisacodyl, gadobutrol, guaiFENesin, ipratropium-albuterol, menthol-cetylpyridinium, milk and molasses, ondansetron (ZOFRAN) IV  Antimicrobials: Anti-infectives (From admission, onward)    Start     Dose/Rate Route Frequency Ordered Stop   07/04/22 1730  fluconazole (DIFLUCAN) tablet 150 mg        150 mg Oral  Once 07/04/22 1633     07/01/22 2200  vancomycin (VANCOCIN) IVPB 1000 mg/200 mL premix  Status:  Discontinued        1,000 mg 200 mL/hr over 60 Minutes Intravenous Every 24 hours 06/30/22 2136 07/01/22 1047   07/01/22 0800  ceFEPIme (MAXIPIME) 2 g in sodium chloride 0.9 % 100 mL IVPB  Status:  Discontinued        2 g 200 mL/hr over 30 Minutes Intravenous Every 8 hours 07/01/22 0735 07/01/22 1047   06/30/22 2145  ceFEPIme (MAXIPIME) 2 g in sodium chloride 0.9 % 100 mL IVPB  Status:  Discontinued        2 g 200 mL/hr over 30 Minutes Intravenous Every 12 hours 06/30/22 2136 07/01/22 0735   06/30/22 2145  vancomycin (VANCOREADY) IVPB 2000 mg/400 mL        2,000 mg 200 mL/hr over 120 Minutes Intravenous  Once 06/30/22 2136 07/01/22 0204        I have personally reviewed the following labs and images: CBC: Recent Labs  Lab 07/02/22 0834 07/03/22 0742 07/04/22 0810 07/05/22 0433 07/06/22 0420 07/07/22 0317 07/08/22 0431  WBC 8.0 6.4 5.9 7.1 7.0 6.9 6.9  NEUTROABS 5.3  3.7  --   --   --   --   --   HGB 13.6 13.2 13.2 13.0 12.3 13.2 12.0  HCT 42.8 41.3 41.4 42.4 40.1 40.9 37.6  MCV 86.1 87.7 86.8 88.0 89.5 85.9 86.0  PLT 172 132* 101* 88* 78* 72* 71*   BMP &GFR Recent Labs  Lab 07/04/22 0810 07/05/22 0433 07/06/22 0420 07/07/22 0317 07/08/22 0431  NA 144 143 141 139 136  K 3.5 3.3* 3.6 3.7 3.7  CL 111 108 109 109 106  CO2 28 30 27 25 25   GLUCOSE 160* 118* 120* 145* 142*  BUN 14 12 11 10  7*  CREATININE 0.76 0.83 0.75 0.66 0.74  CALCIUM 9.2 9.1 8.5* 8.3* 8.1*  MG 2.1 2.1 1.9 1.8 1.9  PHOS 2.0* 3.1 2.2* 2.6 2.1*   Estimated Creatinine Clearance: 62.2 mL/min (by C-G formula based on SCr of 0.74 mg/dL). Liver &  Pancreas: Recent Labs  Lab 07/04/22 0810 07/05/22 0433 07/06/22 0420 07/07/22 0317 07/08/22 0431  AST 62* 68* 70* 60* 57*  ALT 40 45* 45* 43 45*  ALKPHOS 67 63 62 67 71  BILITOT 0.9 0.7 0.5 0.5 0.8  PROT 5.7* 5.5* 5.0* 5.3* 5.1*  ALBUMIN 1.7* 1.7* <1.5* 1.5* 1.5*   No results for input(s): "LIPASE", "AMYLASE" in the last 168 hours.  No results for input(s): "AMMONIA" in the last 168 hours.  Diabetic: No results for input(s): "HGBA1C" in the last 72 hours. Recent Labs  Lab 07/07/22 0416 07/07/22 0740 07/07/22 1144 07/07/22 1637 07/07/22 2123  GLUCAP 160* 122* 120* 124* 126*   Cardiac Enzymes: Recent Labs  Lab 07/06/22 0420 07/07/22 0317  CKTOTAL 30* 31*   No results for input(s): "PROBNP" in the last 8760 hours. Coagulation Profile: No results for input(s): "INR", "PROTIME" in the last 168 hours.  Thyroid Function Tests: No results for input(s): "TSH", "T4TOTAL", "FREET4", "T3FREE", "THYROIDAB" in the last 72 hours.  Lipid Profile: No results for input(s): "CHOL", "HDL", "LDLCALC", "TRIG", "CHOLHDL", "LDLDIRECT" in the last 72 hours. Anemia Panel: No results for input(s): "VITAMINB12", "FOLATE", "FERRITIN", "TIBC", "IRON", "RETICCTPCT" in the last 72 hours.  Urine analysis:    Component Value Date/Time    COLORURINE YELLOW 07/01/2022 0610   APPEARANCEUR CLEAR 07/01/2022 0610   LABSPEC 1.038 (H) 07/01/2022 0610   PHURINE 5.0 07/01/2022 0610   GLUCOSEU NEGATIVE 07/01/2022 0610   HGBUR NEGATIVE 07/01/2022 0610   BILIRUBINUR NEGATIVE 07/01/2022 0610   KETONESUR 5 (A) 07/01/2022 0610   PROTEINUR >=300 (A) 07/01/2022 0610   UROBILINOGEN 0.2 09/15/2011 0530   NITRITE NEGATIVE 07/01/2022 0610   LEUKOCYTESUR NEGATIVE 07/01/2022 0610   Sepsis Labs: Invalid input(s): "PROCALCITONIN", "LACTICIDVEN"  Microbiology: Recent Results (from the past 240 hour(s))  Resp panel by RT-PCR (RSV, Flu A&B, Covid) Anterior Nasal Swab     Status: None   Collection Time: 06/30/22  1:31 PM   Specimen: Anterior Nasal Swab  Result Value Ref Range Status   SARS Coronavirus 2 by RT PCR NEGATIVE NEGATIVE Final   Influenza A by PCR NEGATIVE NEGATIVE Final   Influenza B by PCR NEGATIVE NEGATIVE Final    Comment: (NOTE) The Xpert Xpress SARS-CoV-2/FLU/RSV plus assay is intended as an aid in the diagnosis of influenza from Nasopharyngeal swab specimens and should not be used as a sole basis for treatment. Nasal washings and aspirates are unacceptable for Xpert Xpress SARS-CoV-2/FLU/RSV testing.  Fact Sheet for Patients: EntrepreneurPulse.com.au  Fact Sheet for Healthcare Providers: IncredibleEmployment.be  This test is not yet approved or cleared by the [place] FDA and has been authorized for detection and/or diagnosis of SARS-CoV-2 by FDA under an Emergency Use Authorization (EUA). This EUA will remain in effect (meaning this test can be used) for the duration of the COVID-19 declaration under Section 564(b)(1) of the Act, 21 U.S.C. section 360bbb-3(b)(1), unless the authorization is terminated or revoked.     Resp Syncytial Virus by PCR NEGATIVE NEGATIVE Final    Comment: (NOTE) Fact Sheet for Patients: EntrepreneurPulse.com.au  Fact Sheet for  Healthcare Providers: IncredibleEmployment.be  This test is not yet approved or cleared by the [place] FDA and has been authorized for detection and/or diagnosis of SARS-CoV-2 by FDA under an Emergency Use Authorization (EUA). This EUA will remain in effect (meaning this test can be used) for the duration of the COVID-19 declaration under Section 564(b)(1) of the Act, 21 U.S.C. section 360bbb-3(b)(1), unless the authorization is  terminated or revoked.  Performed at [facility], [place] [address] [place], [place] [address]   Culture, blood (routine x 2)     Status: None   Collection Time: 06/30/22  7:52 PM   Specimen: BLOOD  Result Value Ref Range Status   Specimen Description BLOOD LEFT ANTECUBITAL  Final   Special Requests   Final    BOTTLES DRAWN AEROBIC AND ANAEROBIC Blood Culture adequate volume   Culture   Final    NO GROWTH 5 DAYS Performed at [facility], [place] [address], [place], [place] [address]    Report Status 07/05/2022 FINAL  Final  Culture, blood (routine x 2)     Status: None   Collection Time: 06/30/22  8:28 PM   Specimen: BLOOD LEFT HAND  Result Value Ref Range Status   Specimen Description BLOOD LEFT HAND  Final   Special Requests   Final    BOTTLES DRAWN AEROBIC AND ANAEROBIC Blood Culture adequate volume   Culture   Final    NO GROWTH 5 DAYS Performed at [facility], [place] [address] [place], [place] [address]    Report Status 07/05/2022 FINAL  Final  MRSA Next Gen by PCR, Nasal     Status: None   Collection Time: 06/30/22 11:21 PM  Result Value Ref Range Status   MRSA by PCR Next Gen NOT DETECTED NOT DETECTED Final    Comment: (NOTE) The GeneXpert MRSA Assay (FDA approved for NASAL specimens only), is one component of a comprehensive MRSA colonization surveillance program. It is not intended to diagnose MRSA infection nor to guide or monitor treatment for MRSA infections. Test performance is not FDA approved  in patients less than 85 years old. Performed at [facility], [place] [address], [place], [place] [address]   CSF culture w Gram Stain     Status: None   Collection Time: 07/02/22  4:46 PM   Specimen: CSF; Cerebrospinal Fluid  Result Value Ref Range Status   Specimen Description CSF  Final   Special Requests LP  Final   Gram Stain NO WBC SEEN NO ORGANISMS SEEN CYTOSPIN SMEAR   Final   Culture   Final    NO GROWTH 3 DAYS Performed at [facility], [place] [address], [place], [place] [address]    Report Status 07/06/2022 FINAL  Final  Respiratory (~20 pathogens) panel by PCR     Status: None   Collection Time: 07/02/22  6:00 PM   Specimen: Nasopharyngeal Swab; Respiratory  Result Value Ref Range Status   Adenovirus NOT DETECTED NOT DETECTED Final   Coronavirus 229E NOT DETECTED NOT DETECTED Final    Comment: (NOTE) The Coronavirus on the Respiratory Panel, DOES NOT test for the novel  Coronavirus (2019 nCoV)    Coronavirus HKU1 NOT DETECTED NOT DETECTED Final   Coronavirus NL63 NOT DETECTED NOT DETECTED Final   Coronavirus OC43 NOT DETECTED NOT DETECTED Final   Metapneumovirus NOT DETECTED NOT DETECTED Final   Rhinovirus / Enterovirus NOT DETECTED NOT DETECTED Final   Influenza A NOT DETECTED NOT DETECTED Final   Influenza B NOT DETECTED NOT DETECTED Final   Parainfluenza Virus 1 NOT DETECTED NOT DETECTED Final   Parainfluenza Virus 2 NOT DETECTED NOT DETECTED Final   Parainfluenza Virus 3 NOT DETECTED NOT DETECTED Final   Parainfluenza Virus 4 NOT DETECTED NOT DETECTED Final   Respiratory Syncytial Virus NOT DETECTED NOT DETECTED Final   Bordetella pertussis NOT  DETECTED NOT DETECTED Final   Bordetella Parapertussis NOT DETECTED NOT DETECTED Final   Chlamydophila pneumoniae NOT DETECTED NOT DETECTED Final   Mycoplasma pneumoniae NOT DETECTED NOT DETECTED Final    Comment: Performed at [facility], [place] [address] [place], [place] [address]    Radiology  Studies: DG Abd Portable 1V  Result Date: 07/08/2022 CLINICAL DATA:  82 year old female with history of vomiting. EXAM: PORTABLE ABDOMEN - 1 VIEW COMPARISON:  07/07/2022 FINDINGS: The bowel gas pattern is normal. No radio-opaque calculi or other significant radiographic abnormality are seen. IMPRESSION: Nonobstructive bowel gas pattern. Electronically Signed   By: Ruthann Cancer M.D.   On: 07/08/2022 08:35      Phillips Climes MD [facility]  If 7PM-7AM, please contact night-coverage [email or web address] 07/08/2022, 10:16 AM

## 2022-07-08 NOTE — Plan of Care (Signed)
  Problem: Coping: Goal: Ability to adjust to condition or change in health will improve Outcome: Progressing   Problem: Fluid Volume: Goal: Ability to maintain a balanced intake and output will improve Outcome: Progressing   Problem: Metabolic: Goal: Ability to maintain appropriate glucose levels will improve Outcome: Progressing   Problem: Nutritional: Goal: Maintenance of adequate nutrition will improve Outcome: Progressing Goal: Progress toward achieving an optimal weight will improve Outcome: Progressing   Problem: Tissue Perfusion: Goal: Adequacy of tissue perfusion will improve Outcome: Progressing   Problem: Elimination: Goal: Will not experience complications related to bowel motility Outcome: Progressing

## 2022-07-08 NOTE — Progress Notes (Signed)
Physical Therapy Treatment Patient Details Name: Barbara Nelson MRN: [id number] DOB: Apr 05, 1941 Today's Date: 07/08/2022   History of Present Illness Pt is an 82 y/o female admitted for AMS in setting of HCAP PNA. Also noted dehydration and fecal impaction. Recent admission 2/1-2/10 for same. PMH: DM2, HTN, CHF.    PT Comments    Pt tolerated today's session well, sisters and daughter at bedside throughout session, eager for pt to participate and providing encouragement throughout. Pt able to stand x2 trials with maxAx2 and blocking of B knees and feet, knees flexed during stance and mild hip flexion noted as well. Pt performing stand pivot transfer to chair with maxAx2 and 2HHA, facilitation of pivot and hips provided. Pt requiring rest breaks between mobility due to fatigue and reports of dizziness, pt closing eyes, educated on keeping eyes open and focusing on an object, pt intermittently correcting. Pt participated in seated TherEx with encouragement, AAROM provided for LAQ for full ROM. Pt will continue to benefit from skilled acute PT at this time to progress mobility, discharge recommendation remains appropriate.     Recommendations for follow up therapy are one component of a multi-disciplinary discharge planning process, led by the attending physician.  Recommendations may be updated based on patient status, additional functional criteria and insurance authorization.  Follow Up Recommendations  Skilled nursing-short term rehab (<3 hours/day) Can patient physically be transported by private vehicle: No   Assistance Recommended at Discharge Frequent or constant Supervision/Assistance  Patient can return home with the following Assistance with cooking/housework;Assist for transportation;Help with stairs or ramp for entrance;Two people to help with walking and/or transfers;Two people to help with bathing/dressing/bathroom;Direct supervision/assist for medications management   Equipment  Recommendations  None recommended by PT    Recommendations for Other Services       Precautions / Restrictions Precautions Precautions: Fall;Other (comment) Precaution Comments: monitor O2 Restrictions Weight Bearing Restrictions: No     Mobility  Bed Mobility Overal bed mobility: Needs Assistance Bed Mobility: Supine to Sit     Supine to sit: +2 for physical assistance, HOB elevated, Mod assist     General bed mobility comments: initiated BLE to EOB today with modAx2 for trunk support pulling into sitting    Transfers Overall transfer level: Needs assistance Equipment used: 2 person hand held assist Transfers: Sit to/from Stand, Bed to chair/wheelchair/BSC Sit to Stand: Max assist, +2 physical assistance, From elevated surface Stand pivot transfers: Max assist, +2 physical assistance, From elevated surface         General transfer comment: maxAx2 to stand at EOB from elevated surface. After rest break able to perform stand pivot transfer with maxAx2 for faciliation of hips and balance, cues for pivoting feet provided    Ambulation/Gait                   Stairs             Wheelchair Mobility    Modified Rankin (Stroke Patients Only)       Balance Overall balance assessment: Needs assistance Sitting-balance support: Bilateral upper extremity supported, Feet supported Sitting balance-Leahy Scale: Poor Sitting balance - Comments: minG primarily but brief moments of minA required for posterior lean, especially when preparing to stand Postural control: Posterior lean Standing balance support: Bilateral upper extremity supported, During functional activity Standing balance-Leahy Scale: Zero Standing balance comment: maxAx2 to maintain standing and facilitate pivot to chair  Cognition Arousal/Alertness: Awake/alert Behavior During Therapy: Flat affect Overall Cognitive Status: Impaired/Different from  baseline Area of Impairment: Memory, Following commands, Problem solving, Attention, Safety/judgement, Awareness                   Current Attention Level: Sustained Memory: Decreased short-term memory Following Commands: Follows one step commands with increased time, Follows one step commands consistently Safety/Judgement: Decreased awareness of safety, Decreased awareness of deficits Awareness: Intellectual Problem Solving: Slow processing, Decreased initiation, Requires verbal cues, Difficulty sequencing General Comments: following commands with increased time and verbal and visual cues today        Exercises General Exercises - Lower Extremity Long Arc Quad: AAROM, Both, 10 reps, Seated Hip Flexion/Marching: AROM, Both, 10 reps, Seated    General Comments General comments (skin integrity, edema, etc.): brief desat to 86% on room air, quickly returning to 90s with seated rest break      Pertinent Vitals/Pain Pain Assessment Pain Assessment: Faces Faces Pain Scale: No hurt    Home Living                          Prior Function            PT Goals (current goals can now be found in the care plan section) Acute Rehab PT Goals Patient Stated Goal: family reports goal is to get more independent to return home PT Goal Formulation: With patient/family Time For Goal Achievement: 07/16/22 Potential to Achieve Goals: Fair Progress towards PT goals: Progressing toward goals    Frequency    Min 3X/week      PT Plan Current plan remains appropriate    Co-evaluation              AM-PAC PT "6 Clicks" Mobility   Outcome Measure  Help needed turning from your back to your side while in a flat bed without using bedrails?: A Lot Help needed moving from lying on your back to sitting on the side of a flat bed without using bedrails?: A Lot Help needed moving to and from a bed to a chair (including a wheelchair)?: Total Help needed standing up from a  chair using your arms (e.g., wheelchair or bedside chair)?: Total Help needed to walk in hospital room?: Total Help needed climbing 3-5 steps with a railing? : Total 6 Click Score: 8    End of Session Equipment Utilized During Treatment: Gait belt Activity Tolerance: Patient tolerated treatment well Patient left: with call bell/phone within reach;with family/visitor present;in chair Nurse Communication: Mobility status PT Visit Diagnosis: Difficulty in walking, not elsewhere classified (R26.2);Muscle weakness (generalized) (M62.81)     Time: 5686-1683 PT Time Calculation (min) (ACUTE ONLY): 30 min  Charges:  $Therapeutic Exercise: 8-22 mins $Therapeutic Activity: 8-22 mins                     Charlynne Cousins, PT DPT Acute Rehabilitation Services Office [phone]    Luvenia Heller 07/08/2022, 4:19 PM

## 2022-07-08 NOTE — Progress Notes (Signed)
Speech Language Pathology Treatment: Dysphagia  Patient Details Name: Barbara Nelson MRN: [id number] DOB: 08/10/1940 Today's Date: 07/08/2022 Time: 4037-5436 SLP Time Calculation (min) (ACUTE ONLY): 15 min  Assessment / Plan / Recommendation Clinical Impression  Patient seen by SLP for skilled therapy session focused on dysphagia goals. Patient's two sisters in room when SLP arrived, patient was sitting at edge of bed and sister feeding her broth via spoon. Of note, patient significantly more alert today as compared to yesterday. No overt s/s aspiration or penetration observed. SLP discussed PO consistency and sisters reporting patient should be ready for "solid foods" by now. SLP did note that although meal ticket had current upgraded diet posted (Dys 1 thin), items on meal tray were all from the full liquids menu. SLP then called patient's daughter to discuss plan for advancing solids. SLP and daughter in agreement to advance to Dys 2 solids to give patient a little texture and to continue with having family order meals and for patient to have full supervision during all PO intake. SLP will continue to follow.   HPI HPI: Pt is a 82 y.o. female who presented with AMS and was admitted with acute metabolic encephalopathy in the setting of HCAP pneumonia. CXR 2/13: Minimal strandy and hazy opacities in both lung bases may  represent atelectasis or infection. PMH: type 2 diabetes mellitus, essential hypertension, chronic diastolic heart failure, chronic hypoxic respiratory failure. BSE 06/20/22 regular texture diet with thin liquids recommended.      SLP Plan  Continue with current plan of care      Recommendations for follow up therapy are one component of a multi-disciplinary discharge planning process, led by the attending physician.  Recommendations may be updated based on patient status, additional functional criteria and insurance authorization.    Recommendations  Diet recommendations:  Dysphagia 2 (fine chop);Thin liquid Liquids provided via: Straw;Cup Medication Administration: Crushed with puree Supervision: Staff to assist with self feeding;Full supervision/cueing for compensatory strategies Compensations: Slow rate;Small sips/bites;Minimize environmental distractions Postural Changes and/or Swallow Maneuvers: Seated upright 90 degrees                Oral Care Recommendations: Oral care BID Follow Up Recommendations: Skilled nursing-short term rehab (<3 hours/day) Assistance recommended at discharge: Frequent or constant Supervision/Assistance SLP Visit Diagnosis: Dysphagia, unspecified (R13.10) Plan: Continue with current plan of care           Sonia Baller, MA, CCC-SLP Speech Therapy

## 2022-07-08 NOTE — Progress Notes (Signed)
[facility] Gastroenterology Progress Note  Barbara Nelson 82 y.o. 12/04/1940  CC:  encephalopathy, fecal impaction   Subjective: Patient seen and examined at bedside.  Family at bedside.  She continues to feel better.  Rectal discomfort has improved.  Currently having bowel movements.  Denies nausea or vomiting.  Tolerating diet.  ROS : afebrile, negative for chest pain   Objective: Vital signs in last 24 hours: Vitals:   07/08/22 0516 07/08/22 1056  BP: 118/67 104/73  Pulse: 99 (!) 102  Resp:  18  Temp: 98.7 F (37.1 C)   SpO2: 92% 94%    Physical Exam:  Elderly patient, not in acute distress.  More alert today.  Abdominal exam benign.  Bowel Sounds present.  No peritoneal signs.  Lab Results: Recent Labs    07/07/22 0317 07/08/22 0431  NA 139 136  K 3.7 3.7  CL 109 106  CO2 25 25  GLUCOSE 145* 142*  BUN 10 7*  CREATININE 0.66 0.74  CALCIUM 8.3* 8.1*  MG 1.8 1.9  PHOS 2.6 2.1*   Recent Labs    07/07/22 0317 07/08/22 0431  AST 60* 57*  ALT 43 45*  ALKPHOS 67 71  BILITOT 0.5 0.8  PROT 5.3* 5.1*  ALBUMIN 1.5* 1.5*   Recent Labs    07/07/22 0317 07/08/22 0431  WBC 6.9 6.9  HGB 13.2 12.0  HCT 40.9 37.6  MCV 85.9 86.0  PLT 72* 71*   No results for input(s): "LABPROT", "INR" in the last 72 hours.    Assessment/Plan: -Constipation and fecal impaction.  Improving -Nausea and vomiting.  Improving. -Encephalopathy unknown etiology.  Improving -Elevated LFTs.-likely transient.  CT and ultrasound showed normal-appearing liver. -Thrombocytopenia.   Recommendations -------------------------- -Repeat x-ray this morning showed normal bowel gas pattern.  Patient's symptoms are overall improving.  Currently having regular bowel movements.  Tolerating diet.  No nausea or vomiting.  Offered flex sig for tomorrow to evaluate for rectal discomfort which could be from recent fecal impaction causing stercoral ulcer, but patient declined flex sig at this time as  her symptoms are improving. -Continue MiraLAX as needed to avoid constipation. -No further inpatient GI workup planned.  GI will sign off.  Call us back if needed.     Otis Brace MD, [place] 07/08/2022, 12:18 PM  Contact #  [phone]

## 2022-07-08 NOTE — TOC Progression Note (Signed)
Transition of Care [facility]) - Progression Note    Patient Details  Name: Barbara Nelson MRN: [id number] Date of Birth: [birth year]  Transition of Care [facility] CM/SW [place], [place] Phone Number: 07/08/2022, 1:18 PM  Clinical Narrative:     CSW called pt's daughter Freda Munro to discuss care plans for when pt eventually discharges. She confirmed that pt was at [facility] prior to this admission. She states she will need to discuss plans with family before making any decisions about discharge plans. CSW inquired if she was interested in pt returning to [facility] at [facility]. Daughter explained that [facility] informed her they weren't holding her bed there and that she wouldn't be making any decisions about DC plans at this time as family is primarily concerned about pt's health improving prior to discussing DC. PT rec continues to be for SNF; TOC will continue to follow to assist with DC needs.   Expected Discharge Plan: [place] Barriers to Discharge: Continued Medical Work up  Expected Discharge Plan and Services In-house Referral: Clinical Social Work     Living arrangements for the past 2 months: Single Family Home                                       Social Determinants of Health (SDOH) Interventions SDOH Screenings   Food Insecurity: No Food Insecurity (07/01/2022)  Housing: Low Risk  (07/01/2022)  Transportation Needs: No Transportation Needs (07/01/2022)  Utilities: Not At Risk (07/01/2022)  Tobacco Use: Medium Risk (07/01/2022)    Readmission Risk Interventions     No data to display

## 2022-07-09 ENCOUNTER — Encounter (HOSPITAL_COMMUNITY)
Admission: EM | Disposition: A | Discharge status: Home Health | Payer: Self-pay | Source: Home / Self Care | Attending: Internal Medicine

## 2022-07-09 DIAGNOSIS — I5032 Chronic diastolic (congestive) heart failure: Secondary | ICD-10-CM | POA: Diagnosis not present

## 2022-07-09 DIAGNOSIS — R112 Nausea with vomiting, unspecified: Secondary | ICD-10-CM

## 2022-07-09 DIAGNOSIS — G9341 Metabolic encephalopathy: Secondary | ICD-10-CM | POA: Diagnosis not present

## 2022-07-09 LAB — CBC
HCT: 38 % (ref 36.0–46.0)
Hemoglobin: 12.4 g/dL (ref 12.0–15.0)
MCH: 27.7 pg (ref 26.0–34.0)
MCHC: 32.6 g/dL (ref 30.0–36.0)
MCV: 84.8 fL (ref 80.0–100.0)
Platelets: 69 10*3/uL — ABNORMAL LOW (ref 150–400)
RBC: 4.48 MIL/uL (ref 3.87–5.11)
RDW: 15.7 % — ABNORMAL HIGH (ref 11.5–15.5)
WBC: 7.2 10*3/uL (ref 4.0–10.5)
nRBC: 0 % (ref 0.0–0.2)

## 2022-07-09 LAB — URINALYSIS, ROUTINE W REFLEX MICROSCOPIC
Bilirubin Urine: NEGATIVE
Glucose, UA: NEGATIVE mg/dL
Ketones, ur: NEGATIVE mg/dL
Nitrite: NEGATIVE
Protein, ur: 100 mg/dL — AB
Specific Gravity, Urine: 1.012 (ref 1.005–1.030)
pH: 7 (ref 5.0–8.0)

## 2022-07-09 LAB — BASIC METABOLIC PANEL
Anion gap: 8 (ref 5–15)
BUN: <5 mg/dL — ABNORMAL LOW (ref 8–23)
CO2: 25 mmol/L (ref 22–32)
Calcium: 8.4 mg/dL — ABNORMAL LOW (ref 8.9–10.3)
Chloride: 105 mmol/L (ref 98–111)
Creatinine, Ser: 0.74 mg/dL (ref 0.44–1.00)
GFR, Estimated: >60 mL/min (ref >60)
Glucose, Bld: 114 mg/dL — ABNORMAL HIGH (ref 70–99)
Potassium: 3.5 mmol/L (ref 3.5–5.1)
Sodium: 138 mmol/L (ref 135–145)

## 2022-07-09 LAB — GLUCOSE, CAPILLARY
Glucose-Capillary: 129 mg/dL — ABNORMAL HIGH (ref 70–99)
Glucose-Capillary: 120 mg/dL — ABNORMAL HIGH (ref 70–99)
Glucose-Capillary: 124 mg/dL — ABNORMAL HIGH (ref 70–99)

## 2022-07-09 SURGERY — SIGMOIDOSCOPY, FLEXIBLE
Anesthesia: Monitor Anesthesia Care

## 2022-07-09 MED ORDER — METOPROLOL SUCCINATE ER 25 MG PO TB24: 12.5000 mg | ORAL_TABLET | Freq: Every day | ORAL | Status: DC

## 2022-07-09 MED ORDER — FUROSEMIDE 40 MG PO TABS
40.0000 mg | ORAL_TABLET | Freq: Every day | ORAL | Status: DC
  Administered 2022-07-09: 40 mg via ORAL
  Filled 2022-07-09: qty 1

## 2022-07-09 MED ORDER — METOPROLOL SUCCINATE ER 25 MG PO TB24
12.5000 mg | ORAL_TABLET | Freq: Every day | ORAL | Status: DC
  Administered 2022-07-09 – 2022-07-15 (×4): 12.5 mg via ORAL
  Filled 2022-07-09 (×7): qty 1

## 2022-07-09 MED ORDER — MAGIC MOUTHWASH
5.0000 mL | Freq: Four times a day (QID) | ORAL | Status: DC
  Administered 2022-07-09 – 2022-07-15 (×16): 5 mL via ORAL
  Filled 2022-07-09 (×31): qty 5

## 2022-07-09 MED ORDER — POTASSIUM CHLORIDE 2 MEQ/ML IV SOLN
INTRAVENOUS | Status: DC
  Filled 2022-07-09 (×2): qty 1000

## 2022-07-09 NOTE — Progress Notes (Signed)
[facility] Gastroenterology Progress Note  NERIDA BOIVIN 82 y.o. Feb 04, 1941  CC:  encephalopathy, fecal impaction   Subjective: Patient seen and examined at bedside.  GI was asked to reevaluate the patient for episodes of vomiting which usually happens in the morning.  Patient seen and examined at bedside.  Patient's daughter at bedside.  Daughter is concerned about early morning phlegm production around 3 to 4 AM in the morning which she described as vomiting.  Patient does not vomit after she wakes up.  Currently having regular bowel movement.  ROS : afebrile, negative for chest pain   Objective: Vital signs in last 24 hours: Vitals:   07/09/22 1100 07/09/22 1128  BP:    Pulse: (!) 55 99  Resp: (!) 29 20  Temp:    SpO2: 94% 95%    Physical Exam:  Elderly patient, not in acute distress.  More alert today.  Abdominal exam benign.  Bowel Sounds present.  No peritoneal signs.  Lab Results: Recent Labs    07/07/22 0317 07/08/22 0431 07/09/22 0449  NA 139 136 138  K 3.7 3.7 3.5  CL 109 106 105  CO2 25 25 25   GLUCOSE 145* 142* 114*  BUN 10 7* <5*  CREATININE 0.66 0.74 0.74  CALCIUM 8.3* 8.1* 8.4*  MG 1.8 1.9  --   PHOS 2.6 2.1*  --    Recent Labs    07/07/22 0317 07/08/22 0431  AST 60* 57*  ALT 43 45*  ALKPHOS 67 71  BILITOT 0.5 0.8  PROT 5.3* 5.1*  ALBUMIN 1.5* 1.5*   Recent Labs    07/08/22 0431 07/09/22 0449  WBC 6.9 7.2  HGB 12.0 12.4  HCT 37.6 38.0  MCV 86.0 84.8  PLT 71* 69*   No results for input(s): "LABPROT", "INR" in the last 72 hours.    Assessment/Plan: -Constipation and fecal impaction.  Improving -Nausea and vomiting.  continues to have intermittent early morning vomiting. -Encephalopathy unknown etiology.  Improving -Elevated LFTs.-likely transient.  CT and ultrasound showed normal-appearing liver. -Thrombocytopenia.   Recommendations -------------------------- -Based on my discussion with the daughter today, they are concerned  for early morning vomiting which mostly contains phlegm production.  This could be related to oropharyngeal or pulmonary issues rather than esophageal but we will go ahead and do barium swallow to rule out any esophageal stricture.  -Abdominal x-ray yesterday showed nonobstructive bowel gas pattern.  Normal right upper quadrant ultrasound on July 01, 2022.  CT abdomen pelvis with contrast on February 13 showed no acute changes in the GI except for fecal impaction in the rectum which since has resolved.  -GI will follow.     Otis Brace MD, [place] 07/09/2022, 11:56 AM  Contact #  [phone]

## 2022-07-09 NOTE — Progress Notes (Signed)
PROGRESS NOTE  Barbara Nelson VQM:[id number] DOB: [birth year]   PCP: Cari Caraway, MD  Patient is from: SNF  DOA: 06/30/2022 LOS: 9  Chief complaints Chief Complaint  Patient presents with   Fatigue   Abdominal Pain   Emesis     Brief Narrative / Interim history:  82 year old F with PMH of diastolic CHF, chronic hypoxic RF on 2 L, HTN, and recent hospitalization from 2/1-10 with CAP and acute on chronic CHF returning with altered mental status, lethargy and vomiting, and admitted with working diagnosis of HCAP and encephalopathy.  Family denies history of dementia or cognitive impairment.  They significant decline in cognition since she was diagnosed with shingles in January and started on gabapentin.  She was weaned off gabapentin but she continued to decline, even worse after her recent hospitalization.  She is currently not on any sedating medication.  In ED, slightly hypertensive.  Not febrile.  Saturating in upper 90s to 100 on 2 L.  No leukocytosis.  Hgb 15.2 (12.4 about 12 days ago).  Slightly elevated LFT and Cr.  Lipase, BNP, ammonia, TSH, CK and procalcitonin within normal.  Lactic acid 2.1.  VBG suggess mild metabolic alkalosis.  UDS negative.  UA with > 300 protein.  XR with minimal strandy and hazy opacity at both lung bases suggesting atelectasis or infection, small left pleural effusion and stable cardiomegaly.  CT abdomen and pelvis with contrast showed large amount of stool in the rectum concerning for impaction.  She had disimpaction in ED.  CT head without acute finding.  MRI brain without acute finding but limited by motion.  Patient was started on IVF, IV cefepime and vancomycin and admitted with working diagnosis of HCAP, encephalopathy and dehydration.  The next day, pneumonia or infection felt to be less likely.  Blood cultures negative.  Antibiotic discontinued.  Further encephalopathy workup including ABG, UDS, B1 and B12 unrevealing.  Neurology consulted and she  had lumbar puncture.  CSF studies unrevealing so far.  EEG suggest moderate diffuse encephalopathy but no seizure or epileptiform discharge.  Started on high-dose thiamine without significant improvement.  Family requested transfer to [place] but both at full capacity.  Fortunately, encephalopathy improved.  GI following for constipation.  Possible flex sigmoidoscopy   Subjective:  She had good BM yesterday, she had another episode of nausea and vomiting this morning.  Objective: Vitals:   07/09/22 1000 07/09/22 1100 07/09/22 1128 07/09/22 1206  BP:    118/82  Pulse: (!) 56 (!) 55 99   Resp: 17 (!) 29 20   Temp:      TempSrc:      SpO2: 91% 94% 95%   Weight:    97.6 kg    Examination:    Patient is awake, frail, no apparent distress, generalized weakness, but no focal deficits, follow simple commands. Symmetrical Chest wall movement, Good air movement bilaterally, CTAB RRR,No Gallops,Rubs or new Murmurs, No Parasternal Heave +ve B.Sounds, Abd Soft, No tenderness, No rebound - guarding or rigidity. No Cyanosis, Clubbing or edema, No new Rash or bruise     Procedures:  2/15-lumbar puncture.  Microbiology summarized: [id number], influenza and RSV PCR nonreactive MRSA PCR screen nonreactive Blood cultures NGTD CSF cultures NGTD.  Assessment and plan: Principal Problem:   Acute metabolic encephalopathy Active Problems:   Acute hypoxic respiratory failure (HCC)   Essential hypertension   Hypercalcemia   Dehydration   Acute prerenal azotemia   Transaminitis   Constipation   DM2 (diabetes  mellitus, type 2) (HCC)   Allergic rhinitis   Chronic diastolic CHF (congestive heart failure) (HCC)  Acute metabolic encephalopathy:  - Initially felt to be dehydration and delirium but mental status worse despite hydration.  - Encephalopathy workup including CT head, MRI brain, ammonia, TSH, VBG, UDS, B1, B12, RPR, CSF study and EEG unrevealing.   - Now awake and alert and  oriented to self and family. -Reorientation and delirium precaution -Completed high-dose thiamine. -SLP advanced diet to dysphagia 2 with thin liquid -Fall and aspiration precaution. -[facility] called for transfer but at capacity. -Neurology available as needed  Vomiting/constipation/possible fecal impaction:  -  CT without acute finding other than fecal impaction.  RUQ [place] negative. S/p disimpaction in ED. Had multiple loose stools after enema -Appreciate input by GI-enema and bowel regimen -Constipation/impaction has resolved, she is with good BM -Continues to have vomiting episodes in the morning, plan for upper GI series  Chronic diastolic CHF - TTE on 2/2 with LVEF of 50 to 55% and G1-DD.  She was somewhat dehydrated on presentation.  She seems to be on Lasix.  No CHF on chest x-ray. -Decrease IV fluid. -Did develop some mild edema, will resume back on home dose Lasix.  Chronic hypoxic respiratory failure:  -On 2 L at baseline.  Currently on room air. -Minimum oxygen to keep saturation above 90%. -Try incentive spirometry and pulmonary toilet.  HCAP/pneumonia ruled out:  - No fever, leukocytosis.  Procalcitonin within normal.  COVID-19, influenza and RSV PCR nonreactive.  MRSA PCR negative.  Full RVP negative.  Blood cultures NGTD.  Cough likely due to silent aspiration in the setting of encephalopathy and vomiting. -Appreciate input by pulmonology. -Continue aspiration precaution and pulmonary toilet.  Dehydration: Likely due to poor intake and vomiting.  Seems to have resolved with IV hydration. -Decrease IV fluid.  Controlled IDDM-2 with hyperglycemia: A1c 6.9%. -Continue current insulin regimen  Elevated LFT:  CK within normal.  LFT improved.  RUQ [place] negative.  Stable. -Continue monitoring  Hypokalemia/hypophosphatemia -Monitor replenish as appropriate.  Hypercalcemia: Likely secondary due to dehydration.  Resolved with hydration.  Thrombocytopenia:  - Platelet  continues to decline.  -Discontinued Lovenox -HIT is negative -SCD for VTE prophylaxis  Generalized weakness/lethargy: In the setting of encephalopathy as above. -PT/OT eval as above  Obesity/decreased oral intake: Decreased oral intake likely due to encephalopathy.  Prealbumin 11.  Resistant NG tube insertion on 2/16.  Now encephalopathy resolved.  Diet advanced to dysphagia 1. Body mass index is 35.81 kg/m. Nutrition Problem: Inadequate oral intake Etiology: lethargy/confusion, dysphagia Signs/Symptoms: NPO status (pocketing/holding food) Interventions: Boost Breeze, MVI   DVT prophylaxis:  Place and maintain sequential compression device Start: 07/09/22 0658 Place and maintain sequential compression device Start: 07/05/22 0743 SCDs Start: 06/30/22 2019  Code Status: Full code Family Communication: D/W daughter at bedside Level of care: Med-Surg Status is: Inpatient Remains inpatient appropriate because: Acute metabolic encephalopathy, dehydration and lethargy.   Final disposition: SNF. Consultants:  Neurology Pulmonology Gastroenterology   Sch Meds:  Scheduled Meds:  bisacodyl  10 mg Rectal Daily   feeding supplement  1 Container Oral BID BM   fluticasone  2 spray Each Nare q morning   furosemide  40 mg Oral Daily   insulin aspart  0-6 Units Subcutaneous TID WC   lidocaine   Topical TID   magic mouthwash  5 mL Oral QID   magnesium citrate  1 Bottle Oral Once   [START ON 07/10/2022] metoprolol succinate  12.5 mg Oral Daily   multivitamin with minerals  1 tablet Oral Daily   polyethylene glycol  17 g Oral BID   [START ON 07/10/2022] thiamine (VITAMIN B1) injection  100 mg Intravenous Daily   Continuous Infusions:  dextrose 5 % and 0.45% NaCl 1,000 mL with potassium chloride 20 mEq infusion     thiamine (VITAMIN B1) injection 250 mg (07/09/22 0926)   PRN Meds:.acetaminophen **OR** acetaminophen, bisacodyl, gadobutrol, guaiFENesin, ipratropium-albuterol,  menthol-cetylpyridinium, milk and molasses, ondansetron (ZOFRAN) IV  Antimicrobials: Anti-infectives (From admission, onward)    Start     Dose/Rate Route Frequency Ordered Stop   07/04/22 1730  fluconazole (DIFLUCAN) tablet 150 mg  Status:  Discontinued        150 mg Oral  Once 07/04/22 1633 07/08/22 1024   07/01/22 2200  vancomycin (VANCOCIN) IVPB 1000 mg/200 mL premix  Status:  Discontinued        1,000 mg 200 mL/hr over 60 Minutes Intravenous Every 24 hours 06/30/22 2136 07/01/22 1047   07/01/22 0800  ceFEPIme (MAXIPIME) 2 g in sodium chloride 0.9 % 100 mL IVPB  Status:  Discontinued        2 g 200 mL/hr over 30 Minutes Intravenous Every 8 hours 07/01/22 0735 07/01/22 1047   06/30/22 2145  ceFEPIme (MAXIPIME) 2 g in sodium chloride 0.9 % 100 mL IVPB  Status:  Discontinued        2 g 200 mL/hr over 30 Minutes Intravenous Every 12 hours 06/30/22 2136 07/01/22 0735   06/30/22 2145  vancomycin (VANCOREADY) IVPB 2000 mg/400 mL        2,000 mg 200 mL/hr over 120 Minutes Intravenous  Once 06/30/22 2136 07/01/22 0204        I have personally reviewed the following labs and images: CBC: Recent Labs  Lab 07/03/22 0742 07/04/22 0810 07/05/22 0433 07/06/22 0420 07/07/22 0317 07/08/22 0431 07/09/22 0449  WBC 6.4   < > 7.1 7.0 6.9 6.9 7.2  NEUTROABS 3.7  --   --   --   --   --   --   HGB 13.2   < > 13.0 12.3 13.2 12.0 12.4  HCT 41.3   < > 42.4 40.1 40.9 37.6 38.0  MCV 87.7   < > 88.0 89.5 85.9 86.0 84.8  PLT 132*   < > 88* 78* 72* 71* 69*   < > = values in this interval not displayed.   BMP &GFR Recent Labs  Lab 07/04/22 0810 07/05/22 0433 07/06/22 0420 07/07/22 0317 07/08/22 0431 07/09/22 0449  NA 144 143 141 139 136 138  K 3.5 3.3* 3.6 3.7 3.7 3.5  CL 111 108 109 109 106 105  CO2 28 30 27 25 25 25   GLUCOSE 160* 118* 120* 145* 142* 114*  BUN 14 12 11 10  7* <5*  CREATININE 0.76 0.83 0.75 0.66 0.74 0.74  CALCIUM 9.2 9.1 8.5* 8.3* 8.1* 8.4*  MG 2.1 2.1 1.9 1.8 1.9  --    PHOS 2.0* 3.1 2.2* 2.6 2.1*  --    Estimated Creatinine Clearance: 62.7 mL/min (by C-G formula based on SCr of 0.74 mg/dL). Liver & Pancreas: Recent Labs  Lab 07/04/22 0810 07/05/22 0433 07/06/22 0420 07/07/22 0317 07/08/22 0431  AST 62* 68* 70* 60* 57*  ALT 40 45* 45* 43 45*  ALKPHOS 67 63 62 67 71  BILITOT 0.9 0.7 0.5 0.5 0.8  PROT 5.7* 5.5* 5.0* 5.3* 5.1*  ALBUMIN 1.7* 1.7* <1.5* 1.5* 1.5*  No results for input(s): "LIPASE", "AMYLASE" in the last 168 hours.  No results for input(s): "AMMONIA" in the last 168 hours.  Diabetic: No results for input(s): "HGBA1C" in the last 72 hours. Recent Labs  Lab 07/08/22 1219 07/08/22 1808 07/08/22 2104 07/09/22 0807 07/09/22 1224  GLUCAP 164* 102* 100* 129* 120*   Cardiac Enzymes: Recent Labs  Lab 07/06/22 0420 07/07/22 0317  CKTOTAL 30* 31*   No results for input(s): "PROBNP" in the last 8760 hours. Coagulation Profile: No results for input(s): "INR", "PROTIME" in the last 168 hours.  Thyroid Function Tests: No results for input(s): "TSH", "T4TOTAL", "FREET4", "T3FREE", "THYROIDAB" in the last 72 hours.  Lipid Profile: No results for input(s): "CHOL", "HDL", "LDLCALC", "TRIG", "CHOLHDL", "LDLDIRECT" in the last 72 hours. Anemia Panel: No results for input(s): "VITAMINB12", "FOLATE", "FERRITIN", "TIBC", "IRON", "RETICCTPCT" in the last 72 hours.  Urine analysis:    Component Value Date/Time   COLORURINE YELLOW 07/09/2022 1216   APPEARANCEUR HAZY (A) 07/09/2022 1216   LABSPEC 1.012 07/09/2022 1216   PHURINE 7.0 07/09/2022 1216   GLUCOSEU NEGATIVE 07/09/2022 1216   HGBUR SMALL (A) 07/09/2022 1216   BILIRUBINUR NEGATIVE 07/09/2022 1216   KETONESUR NEGATIVE 07/09/2022 1216   PROTEINUR 100 (A) 07/09/2022 1216   UROBILINOGEN 0.2 09/15/2011 0530   NITRITE NEGATIVE 07/09/2022 1216   LEUKOCYTESUR SMALL (A) 07/09/2022 1216   Sepsis Labs: Invalid input(s): "PROCALCITONIN", "LACTICIDVEN"  Microbiology: Recent  Results (from the past 240 hour(s))  Resp panel by RT-PCR (RSV, Flu A&B, Covid) Anterior Nasal Swab     Status: None   Collection Time: 06/30/22  1:31 PM   Specimen: Anterior Nasal Swab  Result Value Ref Range Status   SARS Coronavirus 2 by RT PCR NEGATIVE NEGATIVE Final   Influenza A by PCR NEGATIVE NEGATIVE Final   Influenza B by PCR NEGATIVE NEGATIVE Final    Comment: (NOTE) The Xpert Xpress SARS-CoV-2/FLU/RSV plus assay is intended as an aid in the diagnosis of influenza from Nasopharyngeal swab specimens and should not be used as a sole basis for treatment. Nasal washings and aspirates are unacceptable for Xpert Xpress SARS-CoV-2/FLU/RSV testing.  Fact Sheet for Patients: EntrepreneurPulse.com.au  Fact Sheet for Healthcare Providers: IncredibleEmployment.be  This test is not yet approved or cleared by the [place] FDA and has been authorized for detection and/or diagnosis of SARS-CoV-2 by FDA under an Emergency Use Authorization (EUA). This EUA will remain in effect (meaning this test can be used) for the duration of the COVID-19 declaration under Section 564(b)(1) of the Act, 21 U.S.C. section 360bbb-3(b)(1), unless the authorization is terminated or revoked.     Resp Syncytial Virus by PCR NEGATIVE NEGATIVE Final    Comment: (NOTE) Fact Sheet for Patients: EntrepreneurPulse.com.au  Fact Sheet for Healthcare Providers: IncredibleEmployment.be  This test is not yet approved or cleared by the [place] FDA and has been authorized for detection and/or diagnosis of SARS-CoV-2 by FDA under an Emergency Use Authorization (EUA). This EUA will remain in effect (meaning this test can be used) for the duration of the COVID-19 declaration under Section 564(b)(1) of the Act, 21 U.S.C. section 360bbb-3(b)(1), unless the authorization is terminated or revoked.  Performed at [facility],  [place] [address] [place], [place] [address]   Culture, blood (routine x 2)     Status: None   Collection Time: 06/30/22  7:52 PM   Specimen: BLOOD  Result Value Ref Range Status   Specimen Description BLOOD LEFT ANTECUBITAL  Final  Special Requests   Final    BOTTLES DRAWN AEROBIC AND ANAEROBIC Blood Culture adequate volume   Culture   Final    NO GROWTH 5 DAYS Performed at [facility], [place] [address] [place], [place] [address]    Report Status 07/05/2022 FINAL  Final  Culture, blood (routine x 2)     Status: None   Collection Time: 06/30/22  8:28 PM   Specimen: BLOOD LEFT HAND  Result Value Ref Range Status   Specimen Description BLOOD LEFT HAND  Final   Special Requests   Final    BOTTLES DRAWN AEROBIC AND ANAEROBIC Blood Culture adequate volume   Culture   Final    NO GROWTH 5 DAYS Performed at [facility], [place] [address] [place], [place] [address]    Report Status 07/05/2022 FINAL  Final  MRSA Next Gen by PCR, Nasal     Status: None   Collection Time: 06/30/22 11:21 PM  Result Value Ref Range Status   MRSA by PCR Next Gen NOT DETECTED NOT DETECTED Final    Comment: (NOTE) The GeneXpert MRSA Assay (FDA approved for NASAL specimens only), is one component of a comprehensive MRSA colonization surveillance program. It is not intended to diagnose MRSA infection nor to guide or monitor treatment for MRSA infections. Test performance is not FDA approved in patients less than 14 years old. Performed at [facility], [place] [address], [place], [place] [address]   CSF culture w Gram Stain     Status: None   Collection Time: 07/02/22  4:46 PM   Specimen: CSF; Cerebrospinal Fluid  Result Value Ref Range Status   Specimen Description CSF  Final   Special Requests LP  Final   Gram Stain NO WBC SEEN NO ORGANISMS SEEN CYTOSPIN SMEAR   Final   Culture   Final    NO GROWTH 3 DAYS Performed at [facility], [place] [address], [place], [place] [address]     Report Status 07/06/2022 FINAL  Final  Respiratory (~20 pathogens) panel by PCR     Status: None   Collection Time: 07/02/22  6:00 PM   Specimen: Nasopharyngeal Swab; Respiratory  Result Value Ref Range Status   Adenovirus NOT DETECTED NOT DETECTED Final   Coronavirus 229E NOT DETECTED NOT DETECTED Final    Comment: (NOTE) The Coronavirus on the Respiratory Panel, DOES NOT test for the novel  Coronavirus (2019 nCoV)    Coronavirus HKU1 NOT DETECTED NOT DETECTED Final   Coronavirus NL63 NOT DETECTED NOT DETECTED Final   Coronavirus OC43 NOT DETECTED NOT DETECTED Final   Metapneumovirus NOT DETECTED NOT DETECTED Final   Rhinovirus / Enterovirus NOT DETECTED NOT DETECTED Final   Influenza A NOT DETECTED NOT DETECTED Final   Influenza B NOT DETECTED NOT DETECTED Final   Parainfluenza Virus 1 NOT DETECTED NOT DETECTED Final   Parainfluenza Virus 2 NOT DETECTED NOT DETECTED Final   Parainfluenza Virus 3 NOT DETECTED NOT DETECTED Final   Parainfluenza Virus 4 NOT DETECTED NOT DETECTED Final   Respiratory Syncytial Virus NOT DETECTED NOT DETECTED Final   Bordetella pertussis NOT DETECTED NOT DETECTED Final   Bordetella Parapertussis NOT DETECTED NOT DETECTED Final   Chlamydophila pneumoniae NOT DETECTED NOT DETECTED Final   Mycoplasma pneumoniae NOT DETECTED NOT DETECTED Final    Comment: Performed at [facility], [place]. [address], [place],  [address]    Radiology Studies: No results found.  Phillips Climes MD [facility]  If 7PM-7AM, please contact night-coverage [email or web address] 07/09/2022, 1:45 PM

## 2022-07-09 NOTE — Progress Notes (Addendum)
07/09/2022  This patient diagnosed with Acute Metabolic Encephalopathy who was admitted 9 days ago, Ms. Barbara Nelson at 23:03 sharp the  patients daughter came to the desk demanding that her mother was supposed to get metoprolol at 10pm. Charge nurse looked at meds as this nurse was on the phone with another emergent patient situation and notified her that it was not due.  When I ( this nurse) was free from the phone call for the other patient, I spoke to daughter from behind the nurses station and apologized that I had not been in the room to give her medications yet because I had been dealing with emergent situations with my other patients. The charge nurse informed daughter that the Metoprolol was changed to daily today and was ordered to start at 10 am in the morning 2/23 as it had already been given once today. She demanded that she receive it right now as Metoprolol makes her mother drowsy and sleepy during the day, no concerns voiced that the metoprolol makes the mother (patient) drowsy when it was ordered via IVP every 6 hours. Also no concerns voiced with the medication metoprolol when it was ordered by mouth. Additionally,  the Zofran they initially requested 2/22 early morning after patient vomited  the second night in a row ( per the daughter) also makes her drowsy. This nurse offered Zofran and it was verbally accepted then refused, I returned the medication to the pyxis and it was requested to be given by the daughter again. Before this nurse could pull the medication from the pyxis to go administer it, I was notified by the CNA's  that were cleaning up patient that the daughter has refused it again. Tonight, pharmacy was called to reschedule the once daily Metoprolol to be given at night to accommodate family request.

## 2022-07-10 ENCOUNTER — Inpatient Hospital Stay (HOSPITAL_COMMUNITY): Payer: Medicare Other

## 2022-07-10 DIAGNOSIS — R112 Nausea with vomiting, unspecified: Secondary | ICD-10-CM | POA: Diagnosis not present

## 2022-07-10 DIAGNOSIS — L8990 Pressure ulcer of unspecified site, unspecified stage: Secondary | ICD-10-CM | POA: Insufficient documentation

## 2022-07-10 DIAGNOSIS — G9341 Metabolic encephalopathy: Secondary | ICD-10-CM | POA: Diagnosis not present

## 2022-07-10 LAB — CBC
HCT: 40.3 % (ref 36.0–46.0)
Hemoglobin: 13 g/dL (ref 12.0–15.0)
MCH: 27.5 pg (ref 26.0–34.0)
MCHC: 32.3 g/dL (ref 30.0–36.0)
MCV: 85.4 fL (ref 80.0–100.0)
Platelets: 65 10*3/uL — ABNORMAL LOW (ref 150–400)
RBC: 4.72 MIL/uL (ref 3.87–5.11)
RDW: 16 % — ABNORMAL HIGH (ref 11.5–15.5)
WBC: 11 10*3/uL — ABNORMAL HIGH (ref 4.0–10.5)
nRBC: 0.2 % (ref 0.0–0.2)

## 2022-07-10 LAB — BASIC METABOLIC PANEL
Anion gap: 9 (ref 5–15)
BUN: 6 mg/dL — ABNORMAL LOW (ref 8–23)
CO2: 22 mmol/L (ref 22–32)
Calcium: 8.4 mg/dL — ABNORMAL LOW (ref 8.9–10.3)
Chloride: 106 mmol/L (ref 98–111)
Creatinine, Ser: 0.91 mg/dL (ref 0.44–1.00)
GFR, Estimated: >60 mL/min (ref >60)
Glucose, Bld: 102 mg/dL — ABNORMAL HIGH (ref 70–99)
Potassium: 3.5 mmol/L (ref 3.5–5.1)
Sodium: 137 mmol/L (ref 135–145)

## 2022-07-10 LAB — GLUCOSE, CAPILLARY
Glucose-Capillary: 112 mg/dL — ABNORMAL HIGH (ref 70–99)
Glucose-Capillary: 104 mg/dL — ABNORMAL HIGH (ref 70–99)

## 2022-07-10 LAB — PHOSPHORUS: Phosphorus: 2.5 mg/dL (ref 2.5–4.6)

## 2022-07-10 MED ORDER — POTASSIUM CHLORIDE CRYS ER 20 MEQ PO TBCR: 40.0000 meq | EXTENDED_RELEASE_TABLET | Freq: Once | ORAL | Status: DC

## 2022-07-10 MED ORDER — KCL IN DEXTROSE-NACL 20-5-0.45 MEQ/L-%-% IV SOLN
INTRAVENOUS | Status: DC
  Administered 2022-07-10: 25 mL via INTRAVENOUS
  Filled 2022-07-10 (×2): qty 1000

## 2022-07-10 MED ORDER — CEPHALEXIN 250 MG/5ML PO SUSR
500.0000 mg | Freq: Three times a day (TID) | ORAL | Status: DC
  Administered 2022-07-10 – 2022-07-12 (×6): 500 mg via ORAL
  Filled 2022-07-10 (×7): qty 10

## 2022-07-10 MED ORDER — METOPROLOL SUCCINATE ER 25 MG PO TB24
12.5000 mg | ORAL_TABLET | Freq: Once | ORAL | Status: AC
  Administered 2022-07-10: 12.5 mg via ORAL
  Filled 2022-07-10: qty 1

## 2022-07-10 NOTE — Progress Notes (Signed)
MD notified of patient's left upper arm and ac area pink warm and sore. MD ordered po abx and warm compresses.

## 2022-07-10 NOTE — Progress Notes (Signed)
[facility] Gastroenterology Progress Note  Barbara Nelson 82 y.o. 31-Aug-1940  CC:  encephalopathy, fecal impaction   Subjective: Patient seen and examined at bedside.  Patient just came back from radiology.  Looks like she was not able to complete barium swallow test.  Daughter denies any further vomiting.  ROS : afebrile, negative for chest pain   Objective: Vital signs in last 24 hours: Vitals:   07/10/22 0423 07/10/22 0820  BP: 100/62 (!) 142/88  Pulse:    Resp:    Temp:  98.8 F (37.1 C)  SpO2:      Physical Exam:  Elderly patient, not in acute distress.  More alert today.  Abdominal exam benign.  Bowel Sounds present.  No peritoneal signs.  Lab Results: Recent Labs    07/08/22 0431 07/09/22 0449 07/10/22 0611  NA 136 138 137  K 3.7 3.5 3.5  CL 106 105 106  CO2 '25 25 22  '$ GLUCOSE 142* 114* 102*  BUN 7* <5* 6*  CREATININE 0.74 0.74 0.91  CALCIUM 8.1* 8.4* 8.4*  MG 1.9  --   --   PHOS 2.1*  --   --    Recent Labs    07/08/22 0431  AST 57*  ALT 45*  ALKPHOS 71  BILITOT 0.8  PROT 5.1*  ALBUMIN 1.5*   Recent Labs    07/09/22 0449 07/10/22 0611  WBC 7.2 11.0*  HGB 12.4 13.0  HCT 38.0 40.3  MCV 84.8 85.4  PLT 69* 65*   No results for input(s): "LABPROT", "INR" in the last 72 hours.    Assessment/Plan: -Constipation and fecal impaction.  Improving -Nausea and vomiting.  continues to have intermittent early morning vomiting. -Encephalopathy unknown etiology.  Improving -Elevated LFTs.-likely transient.  CT and ultrasound showed normal-appearing liver. -Thrombocytopenia. 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  This could be related to  oropharyngeal or pulmonary issues rather than esophageal.  -Barium swallow was ordered but looks like patient was not cooperative.  -Continue supportive care throughout the weekend.  [facility] will follow-up on Monday.  -Abdominal x-ray yesterday showed nonobstructive bowel gas pattern.  Normal right upper quadrant ultrasound on July 01, 2022.  CT abdomen pelvis with contrast on February 13 showed no acute changes in the GI except for fecal impaction in the rectum which since has resolved.    Otis Brace MD, [place] 07/10/2022, 11:59 AM  Contact #  [phone]

## 2022-07-10 NOTE — Progress Notes (Signed)
Patient was brought to fluoroscopy suite for esophagram. Patient was initially able to give her name and recognized that she was in the hospital. Exam was then attempted, but patient refused to drink barium contrast material. Exam was subsequently terminated as no contrast was ingested. She then started groaning in pain, became tachycardic in the 120s-140s, and her SpO2 dropped to 90%.  She was unable to verbalize her name or her location at this time. Rapid response team was called due to sudden change in mentation with accompanying tachycardia and decrease in O2 saturation. Patient was able to follow commands, move all 4 extremities equally, and her pupils were equal, round, and responsive to light. It was determined that the patient was not immediately unstable and she was transported back to her room. Primary provider and patient's RN were notified of these events via SecureChat.  Lura Em, PA-C 07/10/2022

## 2022-07-10 NOTE — Progress Notes (Signed)
Physical Therapy Treatment Patient Details Name: Barbara Nelson MRN: [id number] DOB: Mar 22, 1941 Today's Date: 07/10/2022   History of Present Illness Pt is an 82 y/o female admitted for AMS in setting of HCAP PNA. Also noted dehydration and fecal impaction. Recent admission 2/1-2/10 for same. PMH: DM2, HTN, CHF.    PT Comments    Pt limited by lethargy during today's session. Pt with an episode of tachycardia earlier today with radiology, HR during today's session 105-120s with supine TherEx and chair position in the bed. Pt intermittently opening her eyes with verbal and tactile cues. Pt able to pull forward with assist in chair position, but requiring AAROM for BLE and BUE exercises today, eyes remaining closed a majority of the session. Per discussion with RN, deferred EOB or OOB mobility at this time, especially with limited participation from pt. Acute PT will continue to follow up with pt to progress mobility and address deficits, discharge recommendations remain appropriate.    Recommendations for follow up therapy are one component of a multi-disciplinary discharge planning process, led by the attending physician.  Recommendations may be updated based on patient status, additional functional criteria and insurance authorization.  Follow Up Recommendations  Skilled nursing-short term rehab (<3 hours/day) Can patient physically be transported by private vehicle: No   Assistance Recommended at Discharge Frequent or constant Supervision/Assistance  Patient can return home with the following Assistance with cooking/housework;Assist for transportation;Help with stairs or ramp for entrance;Two people to help with walking and/or transfers;Two people to help with bathing/dressing/bathroom;Direct supervision/assist for medications management   Equipment Recommendations  None recommended by PT    Recommendations for Other Services       Precautions / Restrictions  Precautions Precautions: Fall;Other (comment) Precaution Comments: monitor O2, HR Restrictions Weight Bearing Restrictions: No     Mobility  Bed Mobility Overal bed mobility: Needs Assistance             General bed mobility comments: pt positioned in chair position. HR elevated at rest, intermittently following commands and remaining lethargic, per discussion with RN, defer attempting EOB or OOB at this time. Pulled forward away from back support x1 trial but pt not maintaining position    Transfers                        Ambulation/Gait                   Stairs             Wheelchair Mobility    Modified Rankin (Stroke Patients Only)       Balance Overall balance assessment: Needs assistance                                          Cognition Arousal/Alertness: Lethargic Behavior During Therapy: Flat affect Overall Cognitive Status: Impaired/Different from baseline Area of Impairment: Memory, Following commands, Problem solving, Attention, Safety/judgement, Awareness                   Current Attention Level: Selective Memory: Decreased short-term memory Following Commands: Follows one step commands inconsistently, Follows one step commands with increased time Safety/Judgement: Decreased awareness of safety, Decreased awareness of deficits Awareness: Intellectual Problem Solving: Slow processing, Decreased initiation, Requires verbal cues, Difficulty sequencing, Requires tactile cues General Comments: pt lethargic today, inconsistently following commands with increased time, eyes remaining  closed a majority of the session        Exercises General Exercises - Upper Extremity Shoulder Flexion: AAROM, Both, 10 reps, Supine Elbow Flexion: AAROM, Both, 10 reps, Supine General Exercises - Lower Extremity Ankle Circles/Pumps: Both, AAROM, 15 reps, Supine Heel Slides: AAROM, Both, 10 reps, Supine Hip  ABduction/ADduction: Both, AAROM, 10 reps, Supine    General Comments General comments (skin integrity, edema, etc.): HR elevated throughout session, had episode earlier today when down with radiology, HR elevating to 120s with chair position and TherEx, pt lethargic throughout, intermittently opening eyes to name and commands. On room air throughout and SPO2 stable      Pertinent Vitals/Pain Pain Assessment Pain Assessment: Faces Faces Pain Scale: No hurt    Home Living                          Prior Function            PT Goals (current goals can now be found in the care plan section) Acute Rehab PT Goals Patient Stated Goal: family reports goal is to get more independent to return home PT Goal Formulation: With patient/family Time For Goal Achievement: 07/16/22 Potential to Achieve Goals: Fair Progress towards PT goals: Progressing toward goals    Frequency    Min 3X/week      PT Plan Current plan remains appropriate    Co-evaluation              AM-PAC PT "6 Clicks" Mobility   Outcome Measure  Help needed turning from your back to your side while in a flat bed without using bedrails?: A Lot Help needed moving from lying on your back to sitting on the side of a flat bed without using bedrails?: A Lot Help needed moving to and from a bed to a chair (including a wheelchair)?: Total Help needed standing up from a chair using your arms (e.g., wheelchair or bedside chair)?: Total Help needed to walk in hospital room?: Total Help needed climbing 3-5 steps with a railing? : Total 6 Click Score: 8    End of Session   Activity Tolerance: Patient limited by lethargy Patient left: with call bell/phone within reach;in bed;with bed alarm set Nurse Communication: Mobility status PT Visit Diagnosis: Difficulty in walking, not elsewhere classified (R26.2);Muscle weakness (generalized) (M62.81)     Time: [id number] PT Time Calculation (min) (ACUTE ONLY): 22  min  Charges:  $Therapeutic Exercise: 8-22 mins                     Charlynne Cousins, PT DPT Acute Rehabilitation Services Office [phone]    Luvenia Heller 07/10/2022, 2:24 PM

## 2022-07-10 NOTE — Progress Notes (Signed)
SLP Cancellation Note  Patient Details Name: Barbara Nelson MRN: [id number] DOB: 1941-01-26   Cancelled treatment:       Reason Eval/Treat Not Completed: Patient at procedure or test/unavailable (Pt being bathed at this time.)  Zriyah Kopplin I. Hardin Negus, [place], [place] Office number [phone]  Horton Marshall 07/10/2022, 9:28 AM

## 2022-07-10 NOTE — Progress Notes (Signed)
PROGRESS NOTE  Barbara Nelson [id number] DOB: 1940/11/19   PCP: Cari Caraway, MD  Patient is from: SNF  DOA: 06/30/2022 LOS: 67  Chief complaints Chief Complaint  Patient presents with   Fatigue   Abdominal Pain   Emesis     Brief Narrative / Interim history:  82 year old F with PMH of diastolic CHF, chronic hypoxic RF on 2 L, HTN, and recent hospitalization from 2/1-10 with CAP and acute on chronic CHF returning with altered mental status, lethargy and vomiting, and admitted with working diagnosis of HCAP and encephalopathy.  Family denies history of dementia or cognitive impairment.  They significant decline in cognition since she was diagnosed with shingles in January and started on gabapentin.  She was weaned off gabapentin but she continued to decline, even worse after her recent hospitalization.  She is currently not on any sedating medication.  In ED, slightly hypertensive.  Not febrile.  Saturating in upper 90s to 100 on 2 L.  No leukocytosis.  Hgb 15.2 (12.4 about 12 days ago).  Slightly elevated LFT and Cr.  Lipase, BNP, ammonia, TSH, CK and procalcitonin within normal.  Lactic acid 2.1.  VBG suggess mild metabolic alkalosis.  UDS negative.  UA with > 300 protein.  XR with minimal strandy and hazy opacity at both lung bases suggesting atelectasis or infection, small left pleural effusion and stable cardiomegaly.  CT abdomen and pelvis with contrast showed large amount of stool in the rectum concerning for impaction.  She had disimpaction in ED.  CT head without acute finding.  MRI brain without acute finding but limited by motion.  Patient was started on IVF, IV cefepime and vancomycin and admitted with working diagnosis of HCAP, encephalopathy and dehydration.  The next day, pneumonia or infection felt to be less likely.  Blood cultures negative.  Antibiotic discontinued.  Further encephalopathy workup including ABG, UDS, B1 and B12 unrevealing.  Neurology consulted and  she had lumbar puncture.  CSF studies unrevealing so far.  EEG suggest moderate diffuse encephalopathy but no seizure or epileptiform discharge.  Started on high-dose thiamine without significant improvement.  Family requested transfer to [place] but both at full capacity.  Fortunately, encephalopathy improved.  GI following for constipation.  As well nausea and vomiting.   Subjective:  Was n.p.o. over midnight, no vomiting this morning  Objective: Vitals:   07/10/22 0820 07/10/22 0900 07/10/22 1100 07/10/22 1200  BP: (!) 142/88     Pulse:  61 77   Resp:  (!) 31 17   Temp: 98.8 F (37.1 C)     TempSrc: Axillary   Oral  SpO2:  (!) 76% 91% 94%  Weight:        Examination:    Patient is awake, frail, no apparent distress, generalized weakness, but no focal deficits, follow simple commands. Symmetrical Chest wall movement, Good air movement bilaterally, CTAB RRR,No Gallops,Rubs or new Murmurs, No Parasternal Heave +ve B.Sounds, Abd Soft, No tenderness, No rebound - guarding or rigidity. No Cyanosis, Clubbing or edema, left upper extremity with some erythema and swelling at previous IV site.    Procedures:  2/15-lumbar puncture.  Microbiology summarized: [id number], influenza and RSV PCR nonreactive MRSA PCR screen nonreactive Blood cultures NGTD CSF cultures NGTD.  Assessment and plan: Principal Problem:   Acute metabolic encephalopathy Active Problems:   Acute hypoxic respiratory failure (HCC)   Essential hypertension   Hypercalcemia   Dehydration   Acute prerenal azotemia   Transaminitis   Constipation  DM2 (diabetes mellitus, type 2) (HCC)   Allergic rhinitis   Chronic diastolic CHF (congestive heart failure) (HCC)  Acute metabolic encephalopathy:  - Initially felt to be dehydration and delirium but mental status worse despite hydration.  - Encephalopathy workup including CT head, MRI brain, ammonia, TSH, VBG, UDS, B1, B12, RPR, CSF study and EEG  unrevealing.   - Now awake and alert and oriented to self and family. -Reorientation and delirium precaution -Completed high-dose thiamine. -SLP advanced diet to dysphagia 2 with thin liquid -Fall and aspiration precaution. -[facility] called for transfer but at capacity. -Neurology available as needed  Vomiting/constipation/possible fecal impaction:  -  CT without acute finding other than fecal impaction.  RUQ [place] negative. S/p disimpaction in ED. Had multiple loose stools after enema -Appreciate input by GI-enema and bowel regimen -Constipation/impaction has resolved, she is with good BM -Barium swallow was ordered this morning, but patient was not cooperative, it was unable to be performed  Chronic diastolic CHF - TTE on 2/2 with LVEF of 50 to 55% and G1-DD.  She was somewhat dehydrated on presentation.  She seems to be on Lasix.  No CHF on chest x-ray. -Decrease IV fluid. -Continue to hold Lasix.  Chronic hypoxic respiratory failure:  -On 2 L at baseline.  Currently on room air. -Minimum oxygen to keep saturation above 90%. -Try incentive spirometry and pulmonary toilet.  HCAP/pneumonia ruled out:  - No fever, leukocytosis.  Procalcitonin within normal.  COVID-19, influenza and RSV PCR nonreactive.  MRSA PCR negative.  Full RVP negative.  Blood cultures NGTD.  Cough likely due to silent aspiration in the setting of encephalopathy and vomiting. -Appreciate input by pulmonology. -Continue aspiration precaution and pulmonary toilet.  Dehydration: Likely due to poor intake and vomiting.  Seems to have resolved with IV hydration. -Decrease IV fluid.  Controlled IDDM-2 with hyperglycemia: A1c 6.9%. -Continue current insulin regimen  Elevated LFT:  CK within normal.  LFT improved.  RUQ [place] negative.  Stable. -Continue monitoring  Hypokalemia/hypophosphatemia -Monitor replenish as appropriate.  Hypercalcemia: Likely secondary due to dehydration.  Resolved with  hydration.  Thrombocytopenia:  - Platelet continues to decline.  -Discontinued Lovenox -HIT is negative -SCD for VTE prophylaxis  Generalized weakness/lethargy: In the setting of encephalopathy as above. -PT/OT eval as above  Left arm IV site appears to be infiltrated, will apply warm compresses and start on Keflex.  Obesity/decreased oral intake: Decreased oral intake likely due to encephalopathy.  Prealbumin 11.  Resistant NG tube insertion on 2/16.  Now encephalopathy resolved.  Diet advanced to dysphagia 1. Body mass index is 35.4 kg/m. Nutrition Problem: Inadequate oral intake Etiology: lethargy/confusion, dysphagia Signs/Symptoms: NPO status (pocketing/holding food) Interventions: Boost Breeze, MVI   DVT prophylaxis:  Place and maintain sequential compression device Start: 07/09/22 0658 Place and maintain sequential compression device Start: 07/05/22 0743 SCDs Start: 06/30/22 2019  Code Status: Full code Family Communication: D/W daughter at bedside Level of care: Med-Surg Status is: Inpatient Remains inpatient appropriate because: Acute metabolic encephalopathy, dehydration and lethargy.   Final disposition: SNF. Consultants:  Neurology Pulmonology Gastroenterology   Sch Meds:  Scheduled Meds:  bisacodyl  10 mg Rectal Daily   cephALEXin  500 mg Oral Q8H   feeding supplement  1 Container Oral BID BM   fluticasone  2 spray Each Nare q morning   insulin aspart  0-6 Units Subcutaneous TID WC   lidocaine   Topical TID   magic mouthwash  5 mL Oral QID  magnesium citrate  1 Bottle Oral Once   metoprolol succinate  12.5 mg Oral QHS   multivitamin with minerals  1 tablet Oral Daily   polyethylene glycol  17 g Oral BID   potassium chloride  40 mEq Oral Once   thiamine (VITAMIN B1) injection  100 mg Intravenous Daily   Continuous Infusions:  dextrose 5 % and 0.45 % NaCl with KCl 20 mEq/L 25 mL (07/10/22 1005)   PRN Meds:.acetaminophen **OR** acetaminophen,  bisacodyl, gadobutrol, guaiFENesin, ipratropium-albuterol, menthol-cetylpyridinium, milk and molasses, ondansetron (ZOFRAN) IV  Antimicrobials: Anti-infectives (From admission, onward)    Start     Dose/Rate Route Frequency Ordered Stop   07/10/22 1400  cephALEXin (KEFLEX) 250 MG/5ML suspension 500 mg        500 mg Oral Every 8 hours 07/10/22 1049 07/13/22 1359   07/04/22 1730  fluconazole (DIFLUCAN) tablet 150 mg  Status:  Discontinued        150 mg Oral  Once 07/04/22 1633 07/08/22 1024   07/01/22 2200  vancomycin (VANCOCIN) IVPB 1000 mg/200 mL premix  Status:  Discontinued        1,000 mg 200 mL/hr over 60 Minutes Intravenous Every 24 hours 06/30/22 2136 07/01/22 1047   07/01/22 0800  ceFEPIme (MAXIPIME) 2 g in sodium chloride 0.9 % 100 mL IVPB  Status:  Discontinued        2 g 200 mL/hr over 30 Minutes Intravenous Every 8 hours 07/01/22 0735 07/01/22 1047   06/30/22 2145  ceFEPIme (MAXIPIME) 2 g in sodium chloride 0.9 % 100 mL IVPB  Status:  Discontinued        2 g 200 mL/hr over 30 Minutes Intravenous Every 12 hours 06/30/22 2136 07/01/22 0735   06/30/22 2145  vancomycin (VANCOREADY) IVPB 2000 mg/400 mL        2,000 mg 200 mL/hr over 120 Minutes Intravenous  Once 06/30/22 2136 07/01/22 0204        I have personally reviewed the following labs and images: CBC: Recent Labs  Lab 07/06/22 0420 07/07/22 0317 07/08/22 0431 07/09/22 0449 07/10/22 0611  WBC 7.0 6.9 6.9 7.2 11.0*  HGB 12.3 13.2 12.0 12.4 13.0  HCT 40.1 40.9 37.6 38.0 40.3  MCV 89.5 85.9 86.0 84.8 85.4  PLT 78* 72* 71* 69* 65*   BMP &GFR Recent Labs  Lab 07/04/22 0810 07/05/22 0433 07/06/22 0420 07/07/22 0317 07/08/22 0431 07/09/22 0449 07/10/22 0611  NA 144 143 141 139 136 138 137  K 3.5 3.3* 3.6 3.7 3.7 3.5 3.5  CL 111 108 109 109 106 105 106  CO2 '28 30 27 25 25 25 22  '$ GLUCOSE 160* 118* 120* 145* 142* 114* 102*  BUN '14 12 11 10 '$ 7* <5* 6*  CREATININE 0.76 0.83 0.75 0.66 0.74 0.74 0.91  CALCIUM  9.2 9.1 8.5* 8.3* 8.1* 8.4* 8.4*  MG 2.1 2.1 1.9 1.8 1.9  --   --   PHOS 2.0* 3.1 2.2* 2.6 2.1*  --   --    Estimated Creatinine Clearance: 54.8 mL/min (by C-G formula based on SCr of 0.91 mg/dL). Liver & Pancreas: Recent Labs  Lab 07/04/22 0810 07/05/22 0433 07/06/22 0420 07/07/22 0317 07/08/22 0431  AST 62* 68* 70* 60* 57*  ALT 40 45* 45* 43 45*  ALKPHOS 67 63 62 67 71  BILITOT 0.9 0.7 0.5 0.5 0.8  PROT 5.7* 5.5* 5.0* 5.3* 5.1*  ALBUMIN 1.7* 1.7* <1.5* 1.5* 1.5*   No results for input(s): "LIPASE", "AMYLASE" in the last  168 hours.  No results for input(s): "AMMONIA" in the last 168 hours.  Diabetic: No results for input(s): "HGBA1C" in the last 72 hours. Recent Labs  Lab 07/08/22 1808 07/08/22 2104 07/09/22 0807 07/09/22 1224 07/09/22 1606  GLUCAP 102* 100* 129* 120* 124*   Cardiac Enzymes: Recent Labs  Lab 07/06/22 0420 07/07/22 0317  CKTOTAL 30* 31*   No results for input(s): "PROBNP" in the last 8760 hours. Coagulation Profile: No results for input(s): "INR", "PROTIME" in the last 168 hours.  Thyroid Function Tests: No results for input(s): "TSH", "T4TOTAL", "FREET4", "T3FREE", "THYROIDAB" in the last 72 hours.  Lipid Profile: No results for input(s): "CHOL", "HDL", "LDLCALC", "TRIG", "CHOLHDL", "LDLDIRECT" in the last 72 hours. Anemia Panel: No results for input(s): "VITAMINB12", "FOLATE", "FERRITIN", "TIBC", "IRON", "RETICCTPCT" in the last 72 hours.  Urine analysis:    Component Value Date/Time   COLORURINE YELLOW 07/09/2022 1216   APPEARANCEUR HAZY (A) 07/09/2022 1216   LABSPEC 1.012 07/09/2022 1216   PHURINE 7.0 07/09/2022 1216   GLUCOSEU NEGATIVE 07/09/2022 1216   HGBUR SMALL (A) 07/09/2022 1216   BILIRUBINUR NEGATIVE 07/09/2022 1216   KETONESUR NEGATIVE 07/09/2022 1216   PROTEINUR 100 (A) 07/09/2022 1216   UROBILINOGEN 0.2 09/15/2011 0530   NITRITE NEGATIVE 07/09/2022 1216   LEUKOCYTESUR SMALL (A) 07/09/2022 1216   Sepsis  Labs: Invalid input(s): "PROCALCITONIN", "LACTICIDVEN"  Microbiology: Recent Results (from the past 240 hour(s))  Resp panel by RT-PCR (RSV, Flu A&B, Covid) Anterior Nasal Swab     Status: None   Collection Time: 06/30/22  1:31 PM   Specimen: Anterior Nasal Swab  Result Value Ref Range Status   SARS Coronavirus 2 by RT PCR NEGATIVE NEGATIVE Final   Influenza A by PCR NEGATIVE NEGATIVE Final   Influenza B by PCR NEGATIVE NEGATIVE Final    Comment: (NOTE) The Xpert Xpress SARS-CoV-2/FLU/RSV plus assay is intended as an aid in the diagnosis of influenza from Nasopharyngeal swab specimens and should not be used as a sole basis for treatment. Nasal washings and aspirates are unacceptable for Xpert Xpress SARS-CoV-2/FLU/RSV testing.  Fact Sheet for Patients: EntrepreneurPulse.com.au  Fact Sheet for Healthcare Providers: IncredibleEmployment.be  This test is not yet approved or cleared by the [place] FDA and has been authorized for detection and/or diagnosis of SARS-CoV-2 by FDA under an Emergency Use Authorization (EUA). This EUA will remain in effect (meaning this test can be used) for the duration of the COVID-19 declaration under Section 564(b)(1) of the Act, 21 U.S.C. section 360bbb-3(b)(1), unless the authorization is terminated or revoked.     Resp Syncytial Virus by PCR NEGATIVE NEGATIVE Final    Comment: (NOTE) Fact Sheet for Patients: EntrepreneurPulse.com.au  Fact Sheet for Healthcare Providers: IncredibleEmployment.be  This test is not yet approved or cleared by the [place] FDA and has been authorized for detection and/or diagnosis of SARS-CoV-2 by FDA under an Emergency Use Authorization (EUA). This EUA will remain in effect (meaning this test can be used) for the duration of the COVID-19 declaration under Section 564(b)(1) of the Act, 21 U.S.C. section 360bbb-3(b)(1), unless the  authorization is terminated or revoked.  Performed at [facility], [place] [address], [place], [place] [address]   Culture, blood (routine x 2)     Status: None   Collection Time: 06/30/22  7:52 PM   Specimen: BLOOD  Result Value Ref Range Status   Specimen Description BLOOD LEFT ANTECUBITAL  Final   Special Requests   Final  BOTTLES DRAWN AEROBIC AND ANAEROBIC Blood Culture adequate volume   Culture   Final    NO GROWTH 5 DAYS Performed at [facility], [place] [address], [place], [place] [address]    Report Status 07/05/2022 FINAL  Final  Culture, blood (routine x 2)     Status: None   Collection Time: 06/30/22  8:28 PM   Specimen: BLOOD LEFT HAND  Result Value Ref Range Status   Specimen Description BLOOD LEFT HAND  Final   Special Requests   Final    BOTTLES DRAWN AEROBIC AND ANAEROBIC Blood Culture adequate volume   Culture   Final    NO GROWTH 5 DAYS Performed at [facility], [place] [address] [place], [place] [address]    Report Status 07/05/2022 FINAL  Final  MRSA Next Gen by PCR, Nasal     Status: None   Collection Time: 06/30/22 11:21 PM  Result Value Ref Range Status   MRSA by PCR Next Gen NOT DETECTED NOT DETECTED Final    Comment: (NOTE) The GeneXpert MRSA Assay (FDA approved for NASAL specimens only), is one component of a comprehensive MRSA colonization surveillance program. It is not intended to diagnose MRSA infection nor to guide or monitor treatment for MRSA infections. Test performance is not FDA approved in patients less than 52 years old. Performed at [facility], [place] [address], [place], [place] [address]   CSF culture w Gram Stain     Status: None   Collection Time: 07/02/22  4:46 PM   Specimen: CSF; Cerebrospinal Fluid  Result Value Ref Range Status   Specimen Description CSF  Final   Special Requests LP  Final   Gram Stain NO WBC SEEN NO ORGANISMS SEEN CYTOSPIN SMEAR   Final   Culture   Final    NO GROWTH 3  DAYS Performed at [facility], [place] [address] [place], [place] [address]    Report Status 07/06/2022 FINAL  Final  Respiratory (~20 pathogens) panel by PCR     Status: None   Collection Time: 07/02/22  6:00 PM   Specimen: Nasopharyngeal Swab; Respiratory  Result Value Ref Range Status   Adenovirus NOT DETECTED NOT DETECTED Final   Coronavirus 229E NOT DETECTED NOT DETECTED Final    Comment: (NOTE) The Coronavirus on the Respiratory Panel, DOES NOT test for the novel  Coronavirus (2019 nCoV)    Coronavirus HKU1 NOT DETECTED NOT DETECTED Final   Coronavirus NL63 NOT DETECTED NOT DETECTED Final   Coronavirus OC43 NOT DETECTED NOT DETECTED Final   Metapneumovirus NOT DETECTED NOT DETECTED Final   Rhinovirus / Enterovirus NOT DETECTED NOT DETECTED Final   Influenza A NOT DETECTED NOT DETECTED Final   Influenza B NOT DETECTED NOT DETECTED Final   Parainfluenza Virus 1 NOT DETECTED NOT DETECTED Final   Parainfluenza Virus 2 NOT DETECTED NOT DETECTED Final   Parainfluenza Virus 3 NOT DETECTED NOT DETECTED Final   Parainfluenza Virus 4 NOT DETECTED NOT DETECTED Final   Respiratory Syncytial Virus NOT DETECTED NOT DETECTED Final   Bordetella pertussis NOT DETECTED NOT DETECTED Final   Bordetella Parapertussis NOT DETECTED NOT DETECTED Final   Chlamydophila pneumoniae NOT DETECTED NOT DETECTED Final   Mycoplasma pneumoniae NOT DETECTED NOT DETECTED Final    Comment: Performed at [facility], [place] [address] [place], [place] [address]    Radiology Studies: DG Fluoro Rm 1-60 Min - No Report  Result Date: 07/10/2022 Fluoroscopy was  utilized by the requesting physician.  No radiographic interpretation.      Phillips Climes MD [facility]  If 7PM-7AM, please contact night-coverage [email or web address] 07/10/2022, 1:07 PM

## 2022-07-10 NOTE — Progress Notes (Addendum)
Occupational Therapy Treatment Patient Details Name: Barbara Nelson MRN: [id number] DOB: 10/29/1940 Today's Date: 07/10/2022   History of present illness Pt is an 82 y/o female admitted for AMS in setting of HCAP PNA. Also noted dehydration and fecal impaction. Recent admission 2/1-2/10 for same. PMH: DM2, HTN, CHF.   OT comments  Focus on EOB sitting balance/tolerance and basic command following. Pt requires Max-Total A x 2 for bed mobility. Able to sit unsupported briefly EOB for 1 min but overall requires up to Max A for sitting balance d/t progressive posterior lean in setting of fatigue. Deferred OOB attempts w/ pending EGD. [name] daughter present and supportive. Provided multiple UE HEPs with education to begin with AROM (focusing on shoulder, scapula, and neck movement) before progressing to resistance exercises w/ theraband. Continue to rec SNF rehab at [place].  HR up to 127bpm SpO2 89-92% on RA BP post activity:109/67   Recommendations for follow up therapy are one component of a multi-disciplinary discharge planning process, led by the attending physician.  Recommendations may be updated based on patient status, additional functional criteria and insurance authorization.    Follow Up Recommendations  Skilled nursing-short term rehab (<3 hours/day)     Assistance Recommended at Discharge Frequent or constant Supervision/Assistance  Patient can return home with the following  Two people to help with walking and/or transfers;Two people to help with bathing/dressing/bathroom   Equipment Recommendations  Wheelchair (measurements OT);Wheelchair cushion (measurements OT)    Recommendations for Other Services      Precautions / Restrictions Precautions Precautions: Fall;Other (comment) Precaution Comments: monitor O2, HR Restrictions Weight Bearing Restrictions: No       Mobility Bed Mobility Overal bed mobility: Needs Assistance Bed Mobility: Rolling, Sit to Supine, Supine  to Sit Rolling: Total assist, +2 for physical assistance, +2 for safety/equipment   Supine to sit: Max assist Sit to supine: Total assist, +2 for physical assistance, +2 for safety/equipment   General bed mobility comments: Max A to sit EOB, heavy assist to advance LE and scoot hips with pad. once lifitng trunk, pt able to assist holding bedrail. Total A x 2 to return to bed w/ family assisting. Total A x 2 to roll side to side for bed pad adjustment    Transfers                         Balance Overall balance assessment: Needs assistance Sitting-balance support: Feet supported, Single extremity supported Sitting balance-Leahy Scale: Poor Sitting balance - Comments: initially Min A with R UE on bedrail. able to briefly sit without support or bedrail for 1 min. With fatigue, begins to lean posteriorly and unable to correct, requiring Max A Postural control: Posterior lean                                 ADL either performed or assessed with clinical judgement   ADL Overall ADL's : Needs assistance/impaired                                       General ADL Comments: Focus on EOB sitting balance/tolerance (pt had recently been bathing with no ADL needs during session). Provided HEP for pt family to assist pt with    Extremity/Trunk Assessment Upper Extremity Assessment Upper Extremity Assessment: Generalized weakness   Lower  Extremity Assessment Lower Extremity Assessment: Defer to PT evaluation        Vision   Vision Assessment?: No apparent visual deficits   Perception     Praxis      Cognition Arousal/Alertness: Awake/alert Behavior During Therapy: Flat affect Overall Cognitive Status: Impaired/Different from baseline Area of Impairment: Memory, Following commands, Problem solving, Attention, Safety/judgement, Awareness                   Current Attention Level: Sustained Memory: Decreased short-term memory Following  Commands: Follows one step commands inconsistently, Follows one step commands with increased time Safety/Judgement: Decreased awareness of safety, Decreased awareness of deficits Awareness: Intellectual Problem Solving: Slow processing, Decreased initiation, Requires verbal cues, Difficulty sequencing General Comments: flat affect, lethargic initially but improved alertness EOB. Inconsistent following of commands and response time; requires significant time to initiate        Exercises      Shoulder Instructions       General Comments Daughter present    Pertinent Vitals/ Pain       Pain Assessment Pain Assessment: Faces Faces Pain Scale: No hurt  Home Living                                          Prior Functioning/Environment              Frequency  Min 2X/week        Progress Toward Goals  OT Goals(current goals can now be found in the care plan section)  Progress towards OT goals: Progressing toward goals  Acute Rehab OT Goals Patient Stated Goal: none stated today; family wants pt to mobilize as much as possible OT Goal Formulation: With patient/family Time For Goal Achievement: 07/16/22 Potential to Achieve Goals: Fair ADL Goals Pt Will Perform Upper Body Bathing: with modified independence;sitting Pt Will Perform Lower Body Bathing: with mod assist;sit to/from stand Pt Will Perform Lower Body Dressing: with set-up;with adaptive equipment;sit to/from stand;with supervision Pt Will Transfer to Toilet: with mod assist;stand pivot transfer;bedside commode Additional ADL Goal #1: Pt to maintain sitting balance > 5 min EOB during functional tasks with no more than min guard  Plan Discharge plan remains appropriate    Co-evaluation                 AM-PAC OT "6 Clicks" Daily Activity     Outcome Measure   Help from another person eating meals?: A Lot Help from another person taking care of personal grooming?: A Lot Help from  another person toileting, which includes using toliet, bedpan, or urinal?: Total Help from another person bathing (including washing, rinsing, drying)?: Total Help from another person to put on and taking off regular upper body clothing?: Total Help from another person to put on and taking off regular lower body clothing?: Total 6 Click Score: 8    End of Session    OT Visit Diagnosis: Unsteadiness on feet (R26.81);Muscle weakness (generalized) (M62.81);Other abnormalities of gait and mobility (R26.89);Other symptoms and signs involving cognitive function   Activity Tolerance Patient tolerated treatment well;Patient limited by fatigue   Patient Left in bed;with call bell/phone within reach;with bed alarm set;with family/visitor present   Nurse Communication Mobility status        Time: [id number] OT Time Calculation (min): 32 min  Charges: OT General Charges $OT Visit: 1 Visit OT Treatments $Therapeutic Activity:  8-22 mins $Therapeutic Exercise: 8-22 mins  Malachy Chamber, OTR/L Acute Rehab Services Office: [phone]   Layla Maw 07/10/2022, 10:40 AM

## 2022-07-11 DIAGNOSIS — G9341 Metabolic encephalopathy: Secondary | ICD-10-CM | POA: Diagnosis not present

## 2022-07-11 LAB — BASIC METABOLIC PANEL
Anion gap: 5 (ref 5–15)
BUN: 8 mg/dL (ref 8–23)
CO2: 24 mmol/L (ref 22–32)
Calcium: 8.1 mg/dL — ABNORMAL LOW (ref 8.9–10.3)
Chloride: 107 mmol/L (ref 98–111)
Creatinine, Ser: 0.86 mg/dL (ref 0.44–1.00)
GFR, Estimated: >60 mL/min (ref >60)
Glucose, Bld: 125 mg/dL — ABNORMAL HIGH (ref 70–99)
Potassium: 3.3 mmol/L — ABNORMAL LOW (ref 3.5–5.1)
Sodium: 136 mmol/L (ref 135–145)

## 2022-07-11 LAB — GLUCOSE, CAPILLARY
Glucose-Capillary: 130 mg/dL — ABNORMAL HIGH (ref 70–99)
Glucose-Capillary: 128 mg/dL — ABNORMAL HIGH (ref 70–99)
Glucose-Capillary: 116 mg/dL — ABNORMAL HIGH (ref 70–99)

## 2022-07-11 LAB — CBC
HCT: 37.4 % (ref 36.0–46.0)
Hemoglobin: 12.2 g/dL (ref 12.0–15.0)
MCH: 27.5 pg (ref 26.0–34.0)
MCHC: 32.6 g/dL (ref 30.0–36.0)
MCV: 84.2 fL (ref 80.0–100.0)
Platelets: 72 10*3/uL — ABNORMAL LOW (ref 150–400)
RBC: 4.44 MIL/uL (ref 3.87–5.11)
RDW: 16 % — ABNORMAL HIGH (ref 11.5–15.5)
WBC: 12.4 10*3/uL — ABNORMAL HIGH (ref 4.0–10.5)
nRBC: 0.2 % (ref 0.0–0.2)

## 2022-07-11 MED ORDER — POTASSIUM CHLORIDE 10 MEQ/100ML IV SOLN
10.0000 meq | INTRAVENOUS | Status: AC
  Administered 2022-07-11 (×3): 10 meq via INTRAVENOUS
  Filled 2022-07-11 (×3): qty 100

## 2022-07-11 MED ORDER — POTASSIUM CHLORIDE CRYS ER 20 MEQ PO TBCR
40.0000 meq | EXTENDED_RELEASE_TABLET | Freq: Once | ORAL | Status: DC
  Filled 2022-07-11: qty 2

## 2022-07-11 MED ORDER — MIDODRINE HCL 5 MG PO TABS
5.0000 mg | ORAL_TABLET | Freq: Three times a day (TID) | ORAL | Status: DC
  Administered 2022-07-11 – 2022-07-14 (×2): 5 mg via ORAL
  Filled 2022-07-11 (×5): qty 1

## 2022-07-11 NOTE — Progress Notes (Signed)
PROGRESS NOTE  Barbara Nelson [id number] DOB: 1941-05-06   PCP: Cari Caraway, MD  Patient is from: SNF  DOA: 06/30/2022 LOS: 61  Chief complaints Chief Complaint  Patient presents with   Fatigue   Abdominal Pain   Emesis     Brief Narrative / Interim history:  82 year old F with PMH of diastolic CHF, chronic hypoxic RF on 2 L, HTN, and recent hospitalization from 2/1-10 with CAP and acute on chronic CHF returning with altered mental status, lethargy and vomiting, and admitted with working diagnosis of HCAP and encephalopathy.  Family denies history of dementia or cognitive impairment.  They significant decline in cognition since she was diagnosed with shingles in January and started on gabapentin.  She was weaned off gabapentin but she continued to decline, even worse after her recent hospitalization.  She is currently not on any sedating medication.  In ED, slightly hypertensive.  Not febrile.  Saturating in upper 90s to 100 on 2 L.  No leukocytosis.  Hgb 15.2 (12.4 about 12 days ago).  Slightly elevated LFT and Cr.  Lipase, BNP, ammonia, TSH, CK and procalcitonin within normal.  Lactic acid 2.1.  VBG suggess mild metabolic alkalosis.  UDS negative.  UA with > 300 protein.  XR with minimal strandy and hazy opacity at both lung bases suggesting atelectasis or infection, small left pleural effusion and stable cardiomegaly.  CT abdomen and pelvis with contrast showed large amount of stool in the rectum concerning for impaction.  She had disimpaction in ED.  CT head without acute finding.  MRI brain without acute finding but limited by motion.  Patient was started on IVF, IV cefepime and vancomycin and admitted with working diagnosis of HCAP, encephalopathy and dehydration.  The next day, pneumonia or infection felt to be less likely.  Blood cultures negative.  Antibiotic discontinued.  Further encephalopathy workup including ABG, UDS, B1 and B12 unrevealing.  Neurology consulted and  she had lumbar puncture.  CSF studies unrevealing so far.  EEG suggest moderate diffuse encephalopathy but no seizure or epileptiform discharge.  Started on high-dose thiamine without significant improvement.  Family requested transfer to [facility] but both at full capacity.  Fortunately, encephalopathy improved.  GI following for constipation.  As well nausea and vomiting.   Subjective:  No vomiting overnight, she remains with poor appetite Objective: Vitals:   07/11/22 0745 07/11/22 1233 07/11/22 1544 07/11/22 1600  BP: 1'09/78 91/73 95/76 '$ 103/79  Pulse: (!) 53 60 60 61  Resp: '17 20 17 18  '$ Temp: 98 F (36.7 C) 98 F (36.7 C) 98.1 F (36.7 C) 98.1 F (36.7 C)  TempSrc: Axillary Oral Axillary Oral  SpO2: 95% 93% 95% 99%  Weight:        Examination:    Patient is awake, frail, no apparent distress, generalized weakness, but no focal deficits, follow simple commands. Symmetrical Chest wall movement, Good air movement bilaterally, diminished air entry at the bases RRR,No Gallops,Rubs or new Murmurs, No Parasternal Heave +ve B.Sounds, Abd Soft, No tenderness, No rebound - guarding or rigidity. No Cyanosis, Clubbing or edema, No new Rash or bruise     Procedures:  2/15-lumbar puncture.  Microbiology summarized: [id number], influenza and RSV PCR nonreactive MRSA PCR screen nonreactive Blood cultures NGTD CSF cultures NGTD.  Assessment and plan: Principal Problem:   Acute metabolic encephalopathy Active Problems:   Acute hypoxic respiratory failure (HCC)   Essential hypertension   Hypercalcemia   Dehydration   Acute prerenal azotemia  Transaminitis   Constipation   DM2 (diabetes mellitus, type 2) (HCC)   Allergic rhinitis   Chronic diastolic CHF (congestive heart failure) (HCC)   Nausea and vomiting   Pressure injury of skin  Acute metabolic encephalopathy:  - Initially felt to be dehydration and delirium but mental status worse despite hydration.  -  Encephalopathy workup including CT head, MRI brain, ammonia, TSH, VBG, UDS, B1, B12, RPR, CSF study and EEG unrevealing.   - Now awake and alert and oriented to self and family. -Reorientation and delirium precaution -Completed high-dose thiamine. -SLP advanced diet to dysphagia 2 with thin liquid -Fall and aspiration precaution. -[facility] called for transfer but at capacity. -Neurology available as needed  Vomiting/constipation/possible fecal impaction:  -  CT without acute finding other than fecal impaction.  RUQ [place] negative. S/p disimpaction in ED. Had multiple loose stools after enema -Appreciate input by GI-enema and bowel regimen -Constipation/impaction has resolved, she is with good BM -Barium swallow was ordered this morning, but patient was not cooperative, it was unable to be performed -No vomiting over the last 48 hours  Chronic diastolic CHF - TTE on 2/2 with LVEF of 50 to 55% and G1-DD.  She was somewhat dehydrated on presentation.  She seems to be on Lasix.  No CHF on chest x-ray. -Decrease IV fluid. -Continue to hold Lasix.  Chronic hypoxic respiratory failure:  -On 2 L at baseline.  Currently on room air. -Minimum oxygen to keep saturation above 90%. -Try incentive spirometry and pulmonary toilet. -Started on midodrine for soft blood pressure, continue with low-dose Toprol-XL for sinus tachycardia  HCAP/pneumonia ruled out:  - No fever, leukocytosis.  Procalcitonin within normal.  COVID-19, influenza and RSV PCR nonreactive.  MRSA PCR negative.  Full RVP negative.  Blood cultures NGTD.  Cough likely due to silent aspiration in the setting of encephalopathy and vomiting. -Appreciate input by pulmonology. -Continue aspiration precaution and pulmonary toilet.  Dehydration: Likely due to poor intake and vomiting.  Seems to have resolved with IV hydration. -Decrease IV fluid.  Controlled IDDM-2 with hyperglycemia: A1c 6.9%. -Continue current insulin  regimen  Elevated LFT:  CK within normal.  LFT improved.  RUQ [place] negative.  Stable. -Continue monitoring  Hypokalemia/hypophosphatemia -Monitor replenish as appropriate.  Hypercalcemia: Likely secondary due to dehydration.  Resolved with hydration.  Thrombocytopenia:  - Platelet continues to decline.  -Discontinued Lovenox -HIT is negative -SCD for VTE prophylaxis  Generalized weakness/lethargy: In the setting of encephalopathy as above. -PT/OT eval as above  Left arm IV site appears to be infiltrated, will apply warm compresses and start on Keflex.  Obesity/decreased oral intake: Decreased oral intake likely due to encephalopathy.  Prealbumin 11.  Resistant NG tube insertion on 2/16.  Now encephalopathy resolved.  Diet advanced to dysphagia 1. Body mass index is 35.4 kg/m. Nutrition Problem: Inadequate oral intake Etiology: lethargy/confusion, dysphagia Signs/Symptoms: NPO status (pocketing/holding food) Interventions: Boost Breeze, MVI   DVT prophylaxis:  Place and maintain sequential compression device Start: 07/09/22 0658 Place and maintain sequential compression device Start: 07/05/22 0743 SCDs Start: 06/30/22 2019  Code Status: Full code Family Communication: D/W daughter at bedside Level of care: Med-Surg Status is: Inpatient Remains inpatient appropriate because: Acute metabolic encephalopathy, dehydration and lethargy.   Final disposition: SNF. Consultants:  Neurology Pulmonology Gastroenterology   Sch Meds:  Scheduled Meds:  bisacodyl  10 mg Rectal Daily   cephALEXin  500 mg Oral Q8H   feeding supplement  1 Container Oral BID BM  fluticasone  2 spray Each Nare q morning   insulin aspart  0-6 Units Subcutaneous TID WC   lidocaine   Topical TID   magic mouthwash  5 mL Oral QID   magnesium citrate  1 Bottle Oral Once   metoprolol succinate  12.5 mg Oral QHS   multivitamin with minerals  1 tablet Oral Daily   polyethylene glycol  17 g Oral BID    potassium chloride  40 mEq Oral Once   thiamine (VITAMIN B1) injection  100 mg Intravenous Daily   Continuous Infusions:  dextrose 5 % and 0.45 % NaCl with KCl 20 mEq/L 25 mL (07/10/22 1005)   PRN Meds:.acetaminophen **OR** acetaminophen, bisacodyl, gadobutrol, guaiFENesin, ipratropium-albuterol, menthol-cetylpyridinium, milk and molasses, ondansetron (ZOFRAN) IV  Antimicrobials: Anti-infectives (From admission, onward)    Start     Dose/Rate Route Frequency Ordered Stop   07/10/22 1400  cephALEXin (KEFLEX) 250 MG/5ML suspension 500 mg        500 mg Oral Every 8 hours 07/10/22 1049 07/13/22 1359   07/04/22 1730  fluconazole (DIFLUCAN) tablet 150 mg  Status:  Discontinued        150 mg Oral  Once 07/04/22 1633 07/08/22 1024   07/01/22 2200  vancomycin (VANCOCIN) IVPB 1000 mg/200 mL premix  Status:  Discontinued        1,000 mg 200 mL/hr over 60 Minutes Intravenous Every 24 hours 06/30/22 2136 07/01/22 1047   07/01/22 0800  ceFEPIme (MAXIPIME) 2 g in sodium chloride 0.9 % 100 mL IVPB  Status:  Discontinued        2 g 200 mL/hr over 30 Minutes Intravenous Every 8 hours 07/01/22 0735 07/01/22 1047   06/30/22 2145  ceFEPIme (MAXIPIME) 2 g in sodium chloride 0.9 % 100 mL IVPB  Status:  Discontinued        2 g 200 mL/hr over 30 Minutes Intravenous Every 12 hours 06/30/22 2136 07/01/22 0735   06/30/22 2145  vancomycin (VANCOREADY) IVPB 2000 mg/400 mL        2,000 mg 200 mL/hr over 120 Minutes Intravenous  Once 06/30/22 2136 07/01/22 0204        I have personally reviewed the following labs and images: CBC: Recent Labs  Lab 07/07/22 0317 07/08/22 0431 07/09/22 0449 07/10/22 0611 07/11/22 0246  WBC 6.9 6.9 7.2 11.0* 12.4*  HGB 13.2 12.0 12.4 13.0 12.2  HCT 40.9 37.6 38.0 40.3 37.4  MCV 85.9 86.0 84.8 85.4 84.2  PLT 72* 71* 69* 65* 72*   BMP &GFR Recent Labs  Lab 07/05/22 0433 07/06/22 0420 07/07/22 0317 07/08/22 0431 07/09/22 0449 07/10/22 0556 07/10/22 0611  07/11/22 0246  NA 143 141 139 136 138  --  137 136  K 3.3* 3.6 3.7 3.7 3.5  --  3.5 3.3*  CL 108 109 109 106 105  --  106 107  CO2 '30 27 25 25 25  '$ --  22 24  GLUCOSE 118* 120* 145* 142* 114*  --  102* 125*  BUN '12 11 10 '$ 7* <5*  --  6* 8  CREATININE 0.83 0.75 0.66 0.74 0.74  --  0.91 0.86  CALCIUM 9.1 8.5* 8.3* 8.1* 8.4*  --  8.4* 8.1*  MG 2.1 1.9 1.8 1.9  --   --   --   --   PHOS 3.1 2.2* 2.6 2.1*  --  2.5  --   --    Estimated Creatinine Clearance: 58 mL/min (by C-G formula based on SCr of 0.86 mg/dL).  Liver & Pancreas: Recent Labs  Lab 07/05/22 0433 07/06/22 0420 07/07/22 0317 07/08/22 0431  AST 68* 70* 60* 57*  ALT 45* 45* 43 45*  ALKPHOS 63 62 67 71  BILITOT 0.7 0.5 0.5 0.8  PROT 5.5* 5.0* 5.3* 5.1*  ALBUMIN 1.7* <1.5* 1.5* 1.5*   No results for input(s): "LIPASE", "AMYLASE" in the last 168 hours.  No results for input(s): "AMMONIA" in the last 168 hours.  Diabetic: No results for input(s): "HGBA1C" in the last 72 hours. Recent Labs  Lab 07/10/22 1536 07/10/22 2148 07/11/22 0746 07/11/22 1231 07/11/22 1542  GLUCAP 112* 104* 130* 128* 116*   Cardiac Enzymes: Recent Labs  Lab 07/06/22 0420 07/07/22 0317  CKTOTAL 30* 31*   No results for input(s): "PROBNP" in the last 8760 hours. Coagulation Profile: No results for input(s): "INR", "PROTIME" in the last 168 hours.  Thyroid Function Tests: No results for input(s): "TSH", "T4TOTAL", "FREET4", "T3FREE", "THYROIDAB" in the last 72 hours.  Lipid Profile: No results for input(s): "CHOL", "HDL", "LDLCALC", "TRIG", "CHOLHDL", "LDLDIRECT" in the last 72 hours. Anemia Panel: No results for input(s): "VITAMINB12", "FOLATE", "FERRITIN", "TIBC", "IRON", "RETICCTPCT" in the last 72 hours.  Urine analysis:    Component Value Date/Time   COLORURINE YELLOW 07/09/2022 1216   APPEARANCEUR HAZY (A) 07/09/2022 1216   LABSPEC 1.012 07/09/2022 1216   PHURINE 7.0 07/09/2022 1216   GLUCOSEU NEGATIVE 07/09/2022 1216    HGBUR SMALL (A) 07/09/2022 1216   BILIRUBINUR NEGATIVE 07/09/2022 1216   KETONESUR NEGATIVE 07/09/2022 1216   PROTEINUR 100 (A) 07/09/2022 1216   UROBILINOGEN 0.2 09/15/2011 0530   NITRITE NEGATIVE 07/09/2022 1216   LEUKOCYTESUR SMALL (A) 07/09/2022 1216   Sepsis Labs: Invalid input(s): "PROCALCITONIN", "LACTICIDVEN"  Microbiology: Recent Results (from the past 240 hour(s))  CSF culture w Gram Stain     Status: None   Collection Time: 07/02/22  4:46 PM   Specimen: CSF; Cerebrospinal Fluid  Result Value Ref Range Status   Specimen Description CSF  Final   Special Requests LP  Final   Gram Stain NO WBC SEEN NO ORGANISMS SEEN CYTOSPIN SMEAR   Final   Culture   Final    NO GROWTH 3 DAYS Performed at [facility], [address] [place], [place] [address]    Report Status 07/06/2022 FINAL  Final  Respiratory (~20 pathogens) panel by PCR     Status: None   Collection Time: 07/02/22  6:00 PM   Specimen: Nasopharyngeal Swab; Respiratory  Result Value Ref Range Status   Adenovirus NOT DETECTED NOT DETECTED Final   Coronavirus 229E NOT DETECTED NOT DETECTED Final    Comment: (NOTE) The Coronavirus on the Respiratory Panel, DOES NOT test for the novel  Coronavirus (2019 nCoV)    Coronavirus HKU1 NOT DETECTED NOT DETECTED Final   Coronavirus NL63 NOT DETECTED NOT DETECTED Final   Coronavirus OC43 NOT DETECTED NOT DETECTED Final   Metapneumovirus NOT DETECTED NOT DETECTED Final   Rhinovirus / Enterovirus NOT DETECTED NOT DETECTED Final   Influenza A NOT DETECTED NOT DETECTED Final   Influenza B NOT DETECTED NOT DETECTED Final   Parainfluenza Virus 1 NOT DETECTED NOT DETECTED Final   Parainfluenza Virus 2 NOT DETECTED NOT DETECTED Final   Parainfluenza Virus 3 NOT DETECTED NOT DETECTED Final   Parainfluenza Virus 4 NOT DETECTED NOT DETECTED Final   Respiratory Syncytial Virus NOT DETECTED NOT DETECTED Final   Bordetella pertussis NOT DETECTED NOT DETECTED Final    Bordetella Parapertussis  NOT DETECTED NOT DETECTED Final   Chlamydophila pneumoniae NOT DETECTED NOT DETECTED Final   Mycoplasma pneumoniae NOT DETECTED NOT DETECTED Final    Comment: Performed at [facility], [address], [place], [place] [address]    Radiology Studies: No results found.    Phillips Climes MD [facility]  If 7PM-7AM, please contact night-coverage [email or web address] 07/11/2022, 4:34 PM

## 2022-07-11 NOTE — Plan of Care (Signed)

## 2022-07-12 ENCOUNTER — Inpatient Hospital Stay (HOSPITAL_COMMUNITY): Payer: Medicare Other

## 2022-07-12 DIAGNOSIS — G9341 Metabolic encephalopathy: Secondary | ICD-10-CM | POA: Diagnosis not present

## 2022-07-12 DIAGNOSIS — K5900 Constipation, unspecified: Secondary | ICD-10-CM | POA: Diagnosis not present

## 2022-07-12 DIAGNOSIS — J189 Pneumonia, unspecified organism: Secondary | ICD-10-CM | POA: Diagnosis not present

## 2022-07-12 DIAGNOSIS — I5032 Chronic diastolic (congestive) heart failure: Secondary | ICD-10-CM | POA: Diagnosis not present

## 2022-07-12 LAB — CBC
HCT: 32.8 % — ABNORMAL LOW (ref 36.0–46.0)
Hemoglobin: 11.1 g/dL — ABNORMAL LOW (ref 12.0–15.0)
MCH: 28 pg (ref 26.0–34.0)
MCHC: 33.8 g/dL (ref 30.0–36.0)
MCV: 82.8 fL (ref 80.0–100.0)
Platelets: 70 10*3/uL — ABNORMAL LOW (ref 150–400)
RBC: 3.96 MIL/uL (ref 3.87–5.11)
RDW: 16 % — ABNORMAL HIGH (ref 11.5–15.5)
WBC: 13.8 10*3/uL — ABNORMAL HIGH (ref 4.0–10.5)
nRBC: 0 % (ref 0.0–0.2)

## 2022-07-12 LAB — BASIC METABOLIC PANEL
Anion gap: 7 (ref 5–15)
BUN: 6 mg/dL — ABNORMAL LOW (ref 8–23)
CO2: 22 mmol/L (ref 22–32)
Calcium: 7.9 mg/dL — ABNORMAL LOW (ref 8.9–10.3)
Chloride: 105 mmol/L (ref 98–111)
Creatinine, Ser: 0.88 mg/dL (ref 0.44–1.00)
GFR, Estimated: >60 mL/min (ref >60)
Glucose, Bld: 116 mg/dL — ABNORMAL HIGH (ref 70–99)
Potassium: 3.1 mmol/L — ABNORMAL LOW (ref 3.5–5.1)
Sodium: 134 mmol/L — ABNORMAL LOW (ref 135–145)

## 2022-07-12 LAB — URINALYSIS, ROUTINE W REFLEX MICROSCOPIC
Bacteria, UA: NONE SEEN
Bilirubin Urine: NEGATIVE
Glucose, UA: NEGATIVE mg/dL
Hgb urine dipstick: NEGATIVE
Ketones, ur: NEGATIVE mg/dL
Leukocytes,Ua: NEGATIVE
Nitrite: NEGATIVE
Protein, ur: 100 mg/dL — AB
Specific Gravity, Urine: 1.021 (ref 1.005–1.030)
pH: 5 (ref 5.0–8.0)

## 2022-07-12 LAB — GLUCOSE, CAPILLARY
Glucose-Capillary: 148 mg/dL — ABNORMAL HIGH (ref 70–99)
Glucose-Capillary: 98 mg/dL (ref 70–99)
Glucose-Capillary: 105 mg/dL — ABNORMAL HIGH (ref 70–99)

## 2022-07-12 MED ORDER — SODIUM CHLORIDE 0.9 % IV SOLN
2.0000 g | INTRAVENOUS | Status: DC
  Administered 2022-07-12 – 2022-07-15 (×4): 2 g via INTRAVENOUS
  Filled 2022-07-12 (×4): qty 20

## 2022-07-12 MED ORDER — POTASSIUM CHLORIDE 20 MEQ PO PACK: 40.0000 meq | PACK | Freq: Once | ORAL | Status: DC

## 2022-07-12 MED ORDER — CEFAZOLIN SODIUM-DEXTROSE 1-4 GM/50ML-% IV SOLN
1.0000 g | Freq: Three times a day (TID) | INTRAVENOUS | Status: DC
  Filled 2022-07-12 (×2): qty 50

## 2022-07-12 MED ORDER — DOXYCYCLINE HYCLATE 100 MG PO TABS
100.0000 mg | ORAL_TABLET | Freq: Two times a day (BID) | ORAL | Status: DC
  Administered 2022-07-12 – 2022-07-16 (×9): 100 mg via ORAL
  Filled 2022-07-12 (×9): qty 1

## 2022-07-12 MED ORDER — POTASSIUM CHLORIDE 20 MEQ PO PACK
20.0000 meq | PACK | Freq: Two times a day (BID) | ORAL | Status: DC
  Filled 2022-07-12: qty 1

## 2022-07-12 MED ORDER — POTASSIUM CHLORIDE 10 MEQ/100ML IV SOLN
10.0000 meq | INTRAVENOUS | Status: AC
  Administered 2022-07-12 (×4): 10 meq via INTRAVENOUS
  Filled 2022-07-12: qty 100

## 2022-07-12 NOTE — Progress Notes (Addendum)
PROGRESS NOTE  Barbara Nelson [id number] DOB: [birth year]   PCP: Cari Caraway, MD  Patient is from: SNF  DOA: 06/30/2022 LOS: 57  Chief complaints Chief Complaint  Patient presents with   Fatigue   Abdominal Pain   Emesis     Brief Narrative / Interim history:  82 year old F with PMH of diastolic CHF, chronic hypoxic RF on 2 L, HTN, and recent hospitalization from 2/1-10 with CAP and acute on chronic CHF returning with altered mental status, lethargy and vomiting, and admitted with working diagnosis of HCAP and encephalopathy.  Family denies history of dementia or cognitive impairment.  They significant decline in cognition since she was diagnosed with shingles in January and started on gabapentin.  She was weaned off gabapentin but she continued to decline, even worse after her recent hospitalization.  She is currently not on any sedating medication.  In ED, slightly hypertensive.  Not febrile.  Saturating in upper 90s to 100 on 2 L.  No leukocytosis.  Hgb 15.2 (12.4 about 12 days ago).  Slightly elevated LFT and Cr.  Lipase, BNP, ammonia, TSH, CK and procalcitonin within normal.  Lactic acid 2.1.  VBG suggess mild metabolic alkalosis.  UDS negative.  UA with > 300 protein.  XR with minimal strandy and hazy opacity at both lung bases suggesting atelectasis or infection, small left pleural effusion and stable cardiomegaly.  CT abdomen and pelvis with contrast showed large amount of stool in the rectum concerning for impaction.  She had disimpaction in ED.  CT head without acute finding.  MRI brain without acute finding but limited by motion.  Patient was started on IVF, IV cefepime and vancomycin and admitted with working diagnosis of HCAP, encephalopathy and dehydration.  The next day, pneumonia or infection felt to be less likely.  Blood cultures negative.  Antibiotic discontinued.  Further encephalopathy workup including ABG, UDS, B1 and B12 unrevealing.  Neurology consulted and  she had lumbar puncture.  CSF studies unrevealing so far.  EEG suggest moderate diffuse encephalopathy but no seizure or epileptiform discharge.  Started on high-dose thiamine without significant improvement.  Family requested transfer to [facility] but both at full capacity.  Fortunately, encephalopathy improved.  GI following for constipation.  As well nausea and vomiting.   Subjective:  No vomiting overnight, appetite has slightly improved, but oral intake mains low, she had urinary retention requiring in and out once today Objective: Vitals:   07/12/22 1000 07/12/22 1100 07/12/22 1150 07/12/22 1200  BP:      Pulse: 88 (!) 102  89  Resp: 20 (!) 22  (!) 32  Temp:   (!) 97.4 F (36.3 C)   TempSrc:   Oral   SpO2: 95% 92%  97%  Weight:        Examination:   Awake Alert, frail, deconditioned, in no apparent distress Symmetrical Chest wall movement, diminished air entry at the bases RRR,No Gallops,Rubs or new Murmurs, No Parasternal Heave +ve B.Sounds, Abd Soft, No tenderness, No rebound - guarding or rigidity. No Cyanosis, Clubbing or edema, left upper extremity erythema and swelling   Procedures:  2/15-lumbar puncture.  Microbiology summarized: [id number], influenza and RSV PCR nonreactive MRSA PCR screen nonreactive Blood cultures NGTD CSF cultures NGTD.  Assessment and plan: Principal Problem:   Acute metabolic encephalopathy Active Problems:   Acute hypoxic respiratory failure (HCC)   Essential hypertension   Hypercalcemia   Dehydration   Acute prerenal azotemia   Transaminitis   Constipation  DM2 (diabetes mellitus, type 2) (HCC)   Allergic rhinitis   Chronic diastolic CHF (congestive heart failure) (HCC)   Nausea and vomiting   Pressure injury of skin  Acute metabolic encephalopathy:  - Initially felt to be dehydration and delirium but mental status worse despite hydration.  - Encephalopathy workup including CT head, MRI brain, ammonia, TSH, VBG, UDS,  B1, B12, RPR, CSF study and EEG unrevealing.   - Now awake and alert and oriented to self and family. -Reorientation and delirium precaution -Completed high-dose thiamine. -SLP advanced diet to dysphagia 2 with thin liquid -Fall and aspiration precaution. -[facility] called for transfer but at capacity. -Neurology available as needed  Vomiting/constipation/possible fecal impaction:  -  CT without acute finding other than fecal impaction.  RUQ [place] negative. S/p disimpaction in ED. -Appreciate input by GI-enema and bowel regimen -Constipation/impaction has resolved, she is with good BM -Barium swallow was ordered ,patient was not cooperative, it was unable to be performed -No vomiting over the last 72 hours  Chronic diastolic CHF - TTE on 2/2 with LVEF of 50 to 55% and G1-DD.  She was somewhat dehydrated on presentation.  She seems to be on Lasix.  No CHF on chest x-ray. -Decrease IV fluid. -Continue to hold Lasix.  Chronic hypoxic respiratory failure:  -On 2 L at baseline.  Currently on room air. -Minimum oxygen to keep saturation above 90%. -Try incentive spirometry and pulmonary toilet. -Started on midodrine for soft blood pressure, continue with low-dose Toprol-XL for sinus tachycardia  HCAP/pneumonia ruled out:  - No fever, leukocytosis.  Procalcitonin within normal.  COVID-19, influenza and RSV PCR nonreactive.  MRSA PCR negative.  Full RVP negative.  Blood cultures NGTD.  Cough likely due to silent aspiration in the setting of encephalopathy and vomiting. -Appreciate input by pulmonology. -Continue aspiration precaution and pulmonary toilet. -With worsening leukocytosis, chest x-ray was obtained which did show worsening lower extremity atelectasis versus pneumonia, as well has new opacity infiltrate, will start on Rocephin and doxycycline, follow-up blood cultures -Remains on midodrine given soft blood pressure, remains on low-dose metoprolol given sinus tachycardia.  Urinary  retention -She required in and out overnight, will check UA, monitor bladder scan every 8 hours.  Left upper extremity cellulitis -Patient with left upper extremity erythema at IV site, appears has infiltrated, continue with warm compress, topical antibiotics and IV antibiotics.  Dehydration: Likely due to poor intake and vomiting.  Seems to have resolved with IV hydration. -Decrease IV fluid.  Controlled IDDM-2 with hyperglycemia: A1c 6.9%. -Continue current insulin regimen  Elevated LFT:  CK within normal.  LFT improved.  RUQ [place] negative.  Stable. -Continue monitoring  Hypokalemia/hypophosphatemia -Monitor replenish as appropriate.  Hypercalcemia: Likely secondary due to dehydration.  Resolved with hydration.  Thrombocytopenia:  - Platelet continues to decline.  -Discontinued Lovenox -HIT is negative -SCD for VTE prophylaxis  Generalized weakness/lethargy: In the setting of encephalopathy as above. -PT/OT eval as above   Obesity/decreased oral intake: Decreased oral intake likely due to encephalopathy.  Prealbumin 11.  Resistant NG tube insertion on 2/16.  Now encephalopathy resolved.  Diet advanced to dysphagia 1. Body mass index is 35.44 kg/m. Nutrition Problem: Inadequate oral intake Etiology: lethargy/confusion, dysphagia Signs/Symptoms: NPO status (pocketing/holding food) Interventions: Boost Breeze, MVI   DVT prophylaxis:  Place and maintain sequential compression device Start: 07/09/22 0658 Place and maintain sequential compression device Start: 07/05/22 0743 SCDs Start: 06/30/22 2019  Code Status: Full code Family Communication: D/W daughter at bedside Level  of care: Med-Surg Status is: Inpatient Remains inpatient appropriate because: Acute metabolic encephalopathy, dehydration and lethargy.   Final disposition: SNF. Consultants:  Neurology Pulmonology Gastroenterology   Sch Meds:  Scheduled Meds:  bisacodyl  10 mg Rectal Daily   doxycycline  100  mg Oral Q12H   feeding supplement  1 Container Oral BID BM   fluticasone  2 spray Each Nare q morning   insulin aspart  0-6 Units Subcutaneous TID WC   lidocaine   Topical TID   magic mouthwash  5 mL Oral QID   magnesium citrate  1 Bottle Oral Once   metoprolol succinate  12.5 mg Oral QHS   midodrine  5 mg Oral TID WC   multivitamin with minerals  1 tablet Oral Daily   polyethylene glycol  17 g Oral BID   potassium chloride  40 mEq Oral Once   thiamine (VITAMIN B1) injection  100 mg Intravenous Daily   Continuous Infusions:  cefTRIAXone (ROCEPHIN)  IV     potassium chloride 10 mEq (07/12/22 1244)   PRN Meds:.acetaminophen **OR** acetaminophen, bisacodyl, gadobutrol, guaiFENesin, ipratropium-albuterol, menthol-cetylpyridinium, milk and molasses, ondansetron (ZOFRAN) IV  Antimicrobials: Anti-infectives (From admission, onward)    Start     Dose/Rate Route Frequency Ordered Stop   07/12/22 1415  doxycycline (VIBRA-TABS) tablet 100 mg        100 mg Oral Every 12 hours 07/12/22 1319     07/12/22 1400  ceFAZolin (ANCEF) IVPB 1 g/50 mL premix  Status:  Discontinued        1 g 100 mL/hr over 30 Minutes Intravenous Every 8 hours 07/12/22 1054 07/12/22 1319   07/12/22 1400  cefTRIAXone (ROCEPHIN) 2 g in sodium chloride 0.9 % 100 mL IVPB        2 g 200 mL/hr over 30 Minutes Intravenous Every 24 hours 07/12/22 1319     07/10/22 1400  cephALEXin (KEFLEX) 250 MG/5ML suspension 500 mg  Status:  Discontinued        500 mg Oral Every 8 hours 07/10/22 1049 07/12/22 1054   07/04/22 1730  fluconazole (DIFLUCAN) tablet 150 mg  Status:  Discontinued        150 mg Oral  Once 07/04/22 1633 07/08/22 1024   07/01/22 2200  vancomycin (VANCOCIN) IVPB 1000 mg/200 mL premix  Status:  Discontinued        1,000 mg 200 mL/hr over 60 Minutes Intravenous Every 24 hours 06/30/22 2136 07/01/22 1047   07/01/22 0800  ceFEPIme (MAXIPIME) 2 g in sodium chloride 0.9 % 100 mL IVPB  Status:  Discontinued        2 g 200  mL/hr over 30 Minutes Intravenous Every 8 hours 07/01/22 0735 07/01/22 1047   06/30/22 2145  ceFEPIme (MAXIPIME) 2 g in sodium chloride 0.9 % 100 mL IVPB  Status:  Discontinued        2 g 200 mL/hr over 30 Minutes Intravenous Every 12 hours 06/30/22 2136 07/01/22 0735   06/30/22 2145  vancomycin (VANCOREADY) IVPB 2000 mg/400 mL        2,000 mg 200 mL/hr over 120 Minutes Intravenous  Once 06/30/22 2136 07/01/22 0204        I have personally reviewed the following labs and images: CBC: Recent Labs  Lab 07/08/22 0431 07/09/22 0449 07/10/22 0611 07/11/22 0246 07/12/22 0303  WBC 6.9 7.2 11.0* 12.4* 13.8*  HGB 12.0 12.4 13.0 12.2 11.1*  HCT 37.6 38.0 40.3 37.4 32.8*  MCV 86.0 84.8 85.4 84.2 82.8  PLT 71* 69* 65* 72* 70*   BMP &GFR Recent Labs  Lab 07/06/22 0420 07/07/22 0317 07/08/22 0431 07/09/22 0449 07/10/22 0556 07/10/22 0611 07/11/22 0246 07/12/22 0303  NA 141 139 136 138  --  137 136 134*  K 3.6 3.7 3.7 3.5  --  3.5 3.3* 3.1*  CL 109 109 106 105  --  106 107 105  CO2 '27 25 25 25  '$ --  '22 24 22  '$ GLUCOSE 120* 145* 142* 114*  --  102* 125* 116*  BUN 11 10 7* <5*  --  6* 8 6*  CREATININE 0.75 0.66 0.74 0.74  --  0.91 0.86 0.88  CALCIUM 8.5* 8.3* 8.1* 8.4*  --  8.4* 8.1* 7.9*  MG 1.9 1.8 1.9  --   --   --   --   --   PHOS 2.2* 2.6 2.1*  --  2.5  --   --   --    Estimated Creatinine Clearance: 56.6 mL/min (by C-G formula based on SCr of 0.88 mg/dL). Liver & Pancreas: Recent Labs  Lab 07/06/22 0420 07/07/22 0317 07/08/22 0431  AST 70* 60* 57*  ALT 45* 43 45*  ALKPHOS 62 67 71  BILITOT 0.5 0.5 0.8  PROT 5.0* 5.3* 5.1*  ALBUMIN <1.5* 1.5* 1.5*   No results for input(s): "LIPASE", "AMYLASE" in the last 168 hours.  No results for input(s): "AMMONIA" in the last 168 hours.  Diabetic: No results for input(s): "HGBA1C" in the last 72 hours. Recent Labs  Lab 07/10/22 2148 07/11/22 0746 07/11/22 1231 07/11/22 1542 07/12/22 0935  GLUCAP 104* 130* 128* 116*  148*   Cardiac Enzymes: Recent Labs  Lab 07/06/22 0420 07/07/22 0317  CKTOTAL 30* 31*   No results for input(s): "PROBNP" in the last 8760 hours. Coagulation Profile: No results for input(s): "INR", "PROTIME" in the last 168 hours.  Thyroid Function Tests: No results for input(s): "TSH", "T4TOTAL", "FREET4", "T3FREE", "THYROIDAB" in the last 72 hours.  Lipid Profile: No results for input(s): "CHOL", "HDL", "LDLCALC", "TRIG", "CHOLHDL", "LDLDIRECT" in the last 72 hours. Anemia Panel: No results for input(s): "VITAMINB12", "FOLATE", "FERRITIN", "TIBC", "IRON", "RETICCTPCT" in the last 72 hours.  Urine analysis:    Component Value Date/Time   COLORURINE YELLOW 07/09/2022 1216   APPEARANCEUR HAZY (A) 07/09/2022 1216   LABSPEC 1.012 07/09/2022 1216   PHURINE 7.0 07/09/2022 1216   GLUCOSEU NEGATIVE 07/09/2022 1216   HGBUR SMALL (A) 07/09/2022 1216   BILIRUBINUR NEGATIVE 07/09/2022 1216   KETONESUR NEGATIVE 07/09/2022 1216   PROTEINUR 100 (A) 07/09/2022 1216   UROBILINOGEN 0.2 09/15/2011 0530   NITRITE NEGATIVE 07/09/2022 1216   LEUKOCYTESUR SMALL (A) 07/09/2022 1216   Sepsis Labs: Invalid input(s): "PROCALCITONIN", "LACTICIDVEN"  Microbiology: Recent Results (from the past 240 hour(s))  CSF culture w Gram Stain     Status: None   Collection Time: 07/02/22  4:46 PM   Specimen: CSF; Cerebrospinal Fluid  Result Value Ref Range Status   Specimen Description CSF  Final   Special Requests LP  Final   Gram Stain NO WBC SEEN NO ORGANISMS SEEN CYTOSPIN SMEAR   Final   Culture   Final    NO GROWTH 3 DAYS Performed at [facility], [address], [place], [place] [address]    Report Status 07/06/2022 FINAL  Final  Respiratory (~20 pathogens) panel by PCR     Status: None   Collection Time: 07/02/22  6:00 PM   Specimen: Nasopharyngeal Swab; Respiratory  Result Value Ref Range Status   Adenovirus NOT DETECTED NOT DETECTED Final   Coronavirus 229E NOT DETECTED NOT  DETECTED Final    Comment: (NOTE) The Coronavirus on the Respiratory Panel, DOES NOT test for the novel  Coronavirus (2019 nCoV)    Coronavirus HKU1 NOT DETECTED NOT DETECTED Final   Coronavirus NL63 NOT DETECTED NOT DETECTED Final   Coronavirus OC43 NOT DETECTED NOT DETECTED Final   Metapneumovirus NOT DETECTED NOT DETECTED Final   Rhinovirus / Enterovirus NOT DETECTED NOT DETECTED Final   Influenza A NOT DETECTED NOT DETECTED Final   Influenza B NOT DETECTED NOT DETECTED Final   Parainfluenza Virus 1 NOT DETECTED NOT DETECTED Final   Parainfluenza Virus 2 NOT DETECTED NOT DETECTED Final   Parainfluenza Virus 3 NOT DETECTED NOT DETECTED Final   Parainfluenza Virus 4 NOT DETECTED NOT DETECTED Final   Respiratory Syncytial Virus NOT DETECTED NOT DETECTED Final   Bordetella pertussis NOT DETECTED NOT DETECTED Final   Bordetella Parapertussis NOT DETECTED NOT DETECTED Final   Chlamydophila pneumoniae NOT DETECTED NOT DETECTED Final   Mycoplasma pneumoniae NOT DETECTED NOT DETECTED Final    Comment: Performed at [facility], [place] [address] [place], [place] [address]    Radiology Studies: DG Chest Port 1 View  Result Date: 07/12/2022 CLINICAL DATA:  Leukocytosis. EXAM: PORTABLE CHEST 1 VIEW COMPARISON:  Chest radiograph 06/30/2022. FINDINGS: Mild cardiomegaly is unchanged. Stable mediastinal contours. Increase in patchy bibasilar airspace disease. There may be developing right upper lobe opacity. Small bilateral pleural effusions. No pneumothorax. IMPRESSION: 1. Increase in patchy bibasilar airspace disease, may be atelectasis or pneumonia. Possible developing right upper lobe opacity. 2. Small bilateral pleural effusions. Electronically Signed   By: Keith Rake M.D.   On: 07/12/2022 12:18      Phillips Climes MD [facility]  If 7PM-7AM, please contact night-coverage [email or web address] 07/12/2022, 1:20 PM

## 2022-07-13 DIAGNOSIS — G9341 Metabolic encephalopathy: Secondary | ICD-10-CM | POA: Diagnosis not present

## 2022-07-13 DIAGNOSIS — I5032 Chronic diastolic (congestive) heart failure: Secondary | ICD-10-CM | POA: Diagnosis not present

## 2022-07-13 DIAGNOSIS — J189 Pneumonia, unspecified organism: Secondary | ICD-10-CM | POA: Diagnosis not present

## 2022-07-13 DIAGNOSIS — R112 Nausea with vomiting, unspecified: Secondary | ICD-10-CM | POA: Diagnosis not present

## 2022-07-13 LAB — CBC
HCT: 34.5 % — ABNORMAL LOW (ref 36.0–46.0)
Hemoglobin: 11.3 g/dL — ABNORMAL LOW (ref 12.0–15.0)
MCH: 27.6 pg (ref 26.0–34.0)
MCHC: 32.8 g/dL (ref 30.0–36.0)
MCV: 84.4 fL (ref 80.0–100.0)
Platelets: 80 10*3/uL — ABNORMAL LOW (ref 150–400)
RBC: 4.09 MIL/uL (ref 3.87–5.11)
RDW: 16.1 % — ABNORMAL HIGH (ref 11.5–15.5)
WBC: 10.3 10*3/uL (ref 4.0–10.5)
nRBC: 0 % (ref 0.0–0.2)

## 2022-07-13 LAB — BASIC METABOLIC PANEL
Anion gap: 11 (ref 5–15)
BUN: 6 mg/dL — ABNORMAL LOW (ref 8–23)
CO2: 19 mmol/L — ABNORMAL LOW (ref 22–32)
Calcium: 8.1 mg/dL — ABNORMAL LOW (ref 8.9–10.3)
Chloride: 103 mmol/L (ref 98–111)
Creatinine, Ser: 0.79 mg/dL (ref 0.44–1.00)
GFR, Estimated: >60 mL/min (ref >60)
Glucose, Bld: 110 mg/dL — ABNORMAL HIGH (ref 70–99)
Potassium: 3.6 mmol/L (ref 3.5–5.1)
Sodium: 133 mmol/L — ABNORMAL LOW (ref 135–145)

## 2022-07-13 LAB — BLOOD CULTURE ID PANEL (REFLEXED) - BCID2

## 2022-07-13 LAB — GLUCOSE, CAPILLARY
Glucose-Capillary: 123 mg/dL — ABNORMAL HIGH (ref 70–99)
Glucose-Capillary: 109 mg/dL — ABNORMAL HIGH (ref 70–99)
Glucose-Capillary: 105 mg/dL — ABNORMAL HIGH (ref 70–99)
Glucose-Capillary: 109 mg/dL — ABNORMAL HIGH (ref 70–99)
Glucose-Capillary: 105 mg/dL — ABNORMAL HIGH (ref 70–99)

## 2022-07-13 MED ORDER — FLUCONAZOLE 100 MG PO TABS
100.0000 mg | ORAL_TABLET | Freq: Every day | ORAL | Status: AC
  Administered 2022-07-13 – 2022-07-15 (×3): 100 mg via ORAL
  Filled 2022-07-13 (×3): qty 1

## 2022-07-13 NOTE — Progress Notes (Signed)
Pt has not voided since in/out cath around 2/25 @ 1330.  Pt endorses some urgency.  Bladder scan revealed 325, but in/out cath returned 214m

## 2022-07-13 NOTE — Progress Notes (Signed)
Physical Therapy Treatment Patient Details Name: Barbara Nelson MRN: [id number] DOB: 08-12-1940 Today's Date: 07/13/2022   History of Present Illness Pt is an 82 y/o female admitted for AMS in setting of HCAP PNA. Also noted dehydration and fecal impaction. Recent admission 2/1-2/10 for same. PMH: DM2, HTN, CHF.    PT Comments    Pt is presenting below baseline level of functioning. Pt continues to be lethargic and has difficulty following directions with delayed response and requires Maximum cueing both verbal and tactile to perform activities. Pt was left in chair position today in order to facilitate upright posture due to heavy posterior lean when sitting EOB and unable to get to standing with heavy posterior lean with active resistance to standing with knee extension and extension at the hips with 2 attempts pt was at a high risk for falls to attempt a 3rd time. Pt placed back in supine position. There-ex performed in bed for LE with multiple cues required with visual, tactile and verbal cues for movement with extra time for pt to perform activities. Due to pt current functional status, home set up, available assistance at home and PLOF recommending skilled physical therapy services at a higher level of care on discharge from acute care hospital setting with 24/7 heavy physical assistance in order to decrease risk for immobility, falls, injury and re-hospitalization. Pt HR elevated intermittently throughout session up to 127 bpm.    Recommendations for follow up therapy are one component of a multi-disciplinary discharge planning process, led by the attending physician.  Recommendations may be updated based on patient status, additional functional criteria and insurance authorization.  Follow Up Recommendations  Skilled nursing-short term rehab (<3 hours/day) Can patient physically be transported by private vehicle: No   Assistance Recommended at Discharge Frequent or constant  Supervision/Assistance  Patient can return home with the following Assistance with cooking/housework;Assist for transportation;Help with stairs or ramp for entrance;Two people to help with walking and/or transfers;Two people to help with bathing/dressing/bathroom;Direct supervision/assist for medications management   Equipment Recommendations  None recommended by PT    Recommendations for Other Services       Precautions / Restrictions Precautions Precautions: Fall;Other (comment) Precaution Comments: monitor O2, HR Restrictions Weight Bearing Restrictions: No     Mobility  Bed Mobility Overal bed mobility: Needs Assistance Bed Mobility: Sit to Supine, Supine to Sit     Supine to sit: Max assist, +2 for physical assistance Sit to supine: Total assist, +2 for physical assistance   General bed mobility comments: Pt sat EOB and was able to help less than 25% move R and L LE toward EOB pt was able to help push trunk up from side lying position less than 10% with two person assistance. Patient Response: Flat affect  Transfers Overall transfer level: Needs assistance Equipment used: 2 person hand held assist Transfers: Sit to/from Stand Sit to Stand: Total assist, +2 physical assistance, +2 safety/equipment           General transfer comment: attempted standing at EOB. Pt was unable. RLE kept sliding forward, Pt was heavily leaning posteriorly and starting to slide of EOB. Pt was assisting 0% with quad contraction to gain momentum for standing. Pt was assisted back to supine at total assist.    Ambulation/Gait               General Gait Details: Unable at this time due to pt current functional status.        Balance Overall balance assessment:  Needs assistance Sitting-balance support: Feet supported, Single extremity supported, Bilateral upper extremity supported Sitting balance-Leahy Scale: Poor Sitting balance - Comments: Pt was Mod to Max A with brief periods  of CLose SBA sitting EOB. Postural control: Posterior lean   Standing balance-Leahy Scale: Zero Standing balance comment: Pt unable to get to standing.         Cognition Arousal/Alertness: Lethargic Behavior During Therapy: Flat affect Overall Cognitive Status: Impaired/Different from baseline Area of Impairment: Memory, Following commands, Problem solving, Attention, Safety/judgement, Awareness         Following Commands: Follows one step commands inconsistently, Follows one step commands with increased time Safety/Judgement: Decreased awareness of safety, Decreased awareness of deficits   Problem Solving: Slow processing, Decreased initiation, Requires verbal cues, Difficulty sequencing, Requires tactile cues General Comments: pt lethargic today, inconsistently following commands with increased time. Pt eyes half open most of session.           General Comments General comments (skin integrity, edema, etc.): HR elevated throughout session. Pt was lethargic throughout. Daughter was present and stated that pt is doing much better than previously. Pt was intermittently responsive to requests and following directions intermittently with heavy cueing.      Pertinent Vitals/Pain Pain Assessment Pain Assessment: Faces Faces Pain Scale: Hurts little more Breathing: normal Negative Vocalization: none Facial Expression: smiling or inexpressive Pain Location: RLE Pain Intervention(s): Monitored during session     PT Goals (current goals can now be found in the care plan section) Acute Rehab PT Goals Patient Stated Goal: family reports goal is to get more independent to return home PT Goal Formulation: With patient/family Time For Goal Achievement: 07/16/22 Potential to Achieve Goals: Fair Progress towards PT goals: Progressing toward goals    Frequency    Min 3X/week      PT Plan Current plan remains appropriate       AM-PAC PT "6 Clicks" Mobility   Outcome  Measure  Help needed turning from your back to your side while in a flat bed without using bedrails?: Total Help needed moving from lying on your back to sitting on the side of a flat bed without using bedrails?: Total Help needed moving to and from a bed to a chair (including a wheelchair)?: Total Help needed standing up from a chair using your arms (e.g., wheelchair or bedside chair)?: Total Help needed to walk in hospital room?: Total Help needed climbing 3-5 steps with a railing? : Total 6 Click Score: 6    End of Session Equipment Utilized During Treatment: Gait belt Activity Tolerance: Patient limited by lethargy Patient left: with call bell/phone within reach;in bed;with bed alarm set;with family/visitor present;Other (comment) (Pt in chair position. Not safe to place in recliner at this time due to heavy posterior lean pt was no able to maintain erect trunk position.) Nurse Communication: Mobility status PT Visit Diagnosis: Difficulty in walking, not elsewhere classified (R26.2);Muscle weakness (generalized) (M62.81);Other abnormalities of gait and mobility (R26.89)     Time: [id number] PT Time Calculation (min) (ACUTE ONLY): 45 min  Charges:  $Therapeutic Exercise: 8-22 mins $Therapeutic Activity: 23-37 mins                     Tomma Rakers, DPT, CLT  Acute Rehabilitation Services Office: [phone] (Secure chat preferred)    Ander Purpura 07/13/2022, 4:29 PM

## 2022-07-13 NOTE — Plan of Care (Signed)

## 2022-07-13 NOTE — Consult Note (Signed)
Cardiology Consultation   Patient ID: Barbara Nelson MRN: [id number]; DOB: [birth year]  Admit date: 06/30/2022 Date of Consult: 07/13/2022  PCP:  Cari Caraway, [facility] Cardiologist:  Fransico Him, MD      Patient Profile:   Barbara Nelson is a 82 y.o. female with a hx of viral pericarditis with tamponade s/p pericardial window and anterior partial pericardiectomy in 2013, post-op Afib, history of pulmonary blastomycosis status post right upper lobectomy , chronic LBBB, and HTN who is being seen 07/13/2022 for the evaluation of sinus tachycardia at the request of Dr. Waldron Labs  History of Present Illness:   Barbara Nelson with past medical history noted above.  She follows with Dr. Radford Pax as an outpatient.  She developed viral pericarditis with tamponade requiring a pericardial window and anterior partial pericardiectomy in 2013 with postop atrial fibrillation. Wore cardiac monitor 10/2015 for recurrent palpitations which only showed sinus rhythm. Has not been on [facility].  Echo 06/2022 showed LVEF of 50 to 55%, no regional wall motion abnormality, grade 1 diastolic dysfunction, normal RV size and function, ? Enlarged coronary sinus on echo and ? Mitral valve mass (likely just tissue coming in and out of plane).   Was admitted in 06/2022 with SOB and DOE found to have suspected PNA with mild volume overload. She was diuresed and placed on ABX with improvement and was discharged home. Returned 06/30/22 for worsening mental status and vomiting found to have new infiltrate on CXR now on CTX and doxycycline. HR have been elevated in 100s and family has requested Cardiology evaluation.     Past Medical History:  Diagnosis Date   Benign essential HTN 10/31/2015   Cardiac tamponade due to viral pericarditis    LBBB (left bundle branch block)    Lung mass    fungal infection s/p resection at [facility]   Postoperative atrial fibrillation [facility])    Pulmonary nodule      Past Surgical History:  Procedure Laterality Date   ABDOMINAL HYSTERECTOMY  1988   chest tube placement  09/03/11   LUNG REMOVAL, PARTIAL     right knee arthoscopy with meniscal repair     subxiphoid pericardial window and drainage anterior partial pericardiectomy  09/03/11     Home Medications:  Prior to Admission medications   Medication Sig Start Date End Date Taking? Authorizing Provider  acetaminophen (TYLENOL) 325 MG tablet Take 2 tablets (650 mg total) by mouth every 6 (six) hours as needed for mild pain (or Fever >/= 101). 06/26/22  Yes [name], Henreitta Leber, MD  albuterol (PROVENTIL) (2.5 MG/3ML) 0.083% nebulizer solution Take 3 mLs (2.5 mg total) by nebulization every 2 (two) hours as needed for wheezing or shortness of breath. 06/26/22  Yes [name], Henreitta Leber, MD  bisacodyl (DULCOLAX) 10 MG suppository Place 1 suppository (10 mg total) rectally daily as needed for moderate constipation. 06/26/22  Yes [name], Henreitta Leber, MD  diclofenac Sodium (VOLTAREN) 1 % GEL Apply 2 g topically 4 (four) times daily. Patient taking differently: Apply 2 g topically 4 (four) times daily. 0900, 1300, 1700, 2100 06/26/22  Yes [name], Henreitta Leber, MD  feeding supplement (ENSURE ENLIVE / ENSURE PLUS) LIQD Take 237 mLs by mouth 3 (three) times daily between meals. Patient taking differently: Take 237 mLs by mouth 3 (three) times daily between meals. 1000, 1400, 2200 06/26/22  Yes [name], Henreitta Leber, MD  fluticasone (FLONASE) 50 MCG/ACT nasal spray Place 2 sprays into both nostrils daily. Patient taking  differently: Place 2 sprays into both nostrils every morning. 06/26/22  Yes [name], Henreitta Leber, MD  furosemide (LASIX) 40 MG tablet Take 1 tablet (40 mg total) by mouth daily. Patient taking differently: Take 40 mg by mouth every morning. 06/26/22 06/26/23 Yes [name], Henreitta Leber, MD  insulin aspart (NOVOLOG) 100 UNIT/ML injection 0-9 Units, Subcutaneous, 3 times daily with meals,  CBG < 70: Implement Hypoglycemia  measures  CBG 70 - 120: 0 units  CBG 121 - 150: 1 unit  CBG 151 - 200: 2 units  CBG 201 - 250: 3 units  CBG 251 - 300: 5 units  CBG 301 - 350: 7 units  CBG 351 - 400: 9 units  CBG > 400: call MD 06/26/22  Yes [name], Henreitta Leber, MD  magic mouthwash SOLN Take 15 mLs by mouth 4 (four) times daily as needed for mouth pain. Patient taking differently: Take 15 mLs by mouth 4 (four) times daily as needed for mouth pain. 0900, 1300, 1700, 2100 06/26/22  Yes [name], Henreitta Leber, MD  magnesium hydroxide (MILK OF MAGNESIA) 400 MG/5ML suspension Take 15 mLs by mouth daily as needed for mild constipation. 06/26/22  Yes [name], Henreitta Leber, MD  metFORMIN (GLUCOPHAGE-XR) 500 MG 24 hr tablet Take 500 mg by mouth daily with supper.   Yes [provider]  metoprolol succinate (TOPROL-XL) 25 MG 24 hr tablet Take 0.5 tablets (12.5 mg total) by mouth daily. 06/26/22  Yes [name], Henreitta Leber, MD  Multiple Vitamin (MULTIVITAMIN WITH MINERALS) TABS tablet Take 1 tablet by mouth daily. Patient taking differently: Take 1 tablet by mouth every morning. 06/26/22  Yes [name], Henreitta Leber, MD  ondansetron (ZOFRAN) 4 MG tablet Take 4 mg by mouth every 6 (six) hours as needed for nausea or vomiting.   Yes [provider]  OXYGEN Inhale 2 L into the lungs daily.   Yes [provider]  polyethylene glycol (MIRALAX / GLYCOLAX) 17 g packet Take 17 g by mouth daily. Patient taking differently: Take 17 g by mouth every morning. 06/27/22  Yes [name], Henreitta Leber, MD  potassium chloride SA (KLOR-CON M) 20 MEQ tablet Take 1 tablet (20 mEq total) by mouth daily. Patient taking differently: Take 20 mEq by mouth every morning. 06/26/22  Yes [name], Henreitta Leber, MD  Propylene Glycol (SYSTANE COMPLETE) 0.6 % SOLN Place 2 drops into both eyes 3 (three) times daily. 0900, 1300, 1700   Yes [provider]  senna-docusate (SENOKOT-S) 8.6-50 MG tablet Take 1 tablet by mouth at bedtime. 06/27/22  Yes [name], Henreitta Leber, MD   triamcinolone ointment (KENALOG) 0.5 % Apply 1 Application topically daily as needed (itching). Apply topically to chest every day as needed for itching.   Yes [provider]  Potassium Chloride 10 % SOLN Take 15 mLs by mouth every morning. Mix 15 mL (20 MEQ) with juice and drink by mouth once daily for supplement.    [provider]    Inpatient Medications: Scheduled Meds:  bisacodyl  10 mg Rectal Daily   doxycycline  100 mg Oral Q12H   feeding supplement  1 Container Oral BID BM   fluconazole  100 mg Oral Daily   fluticasone  2 spray Each Nare q morning   insulin aspart  0-6 Units Subcutaneous TID WC   lidocaine   Topical TID   magic mouthwash  5 mL Oral QID   magnesium citrate  1 Bottle Oral Once   metoprolol succinate  12.5 mg Oral QHS  midodrine  5 mg Oral TID WC   multivitamin with minerals  1 tablet Oral Daily   polyethylene glycol  17 g Oral BID   potassium chloride  40 mEq Oral Once   thiamine (VITAMIN B1) injection  100 mg Intravenous Daily   Continuous Infusions:  cefTRIAXone (ROCEPHIN)  IV 2 g (07/13/22 1354)   PRN Meds: acetaminophen **OR** acetaminophen, bisacodyl, gadobutrol, guaiFENesin, ipratropium-albuterol, menthol-cetylpyridinium, milk and molasses, ondansetron (ZOFRAN) IV  Allergies:    Allergies  Allergen Reactions   Latex Rash   Penicillins Rash and Itching   Tape Hives and Rash   Z-Pak [Azithromycin] Rash and Other (See Comments)    Mouth sores   Eggs Or Egg-Derived Products Nausea And Vomiting   Amoxil [Amoxicillin] Rash   Sulfa Antibiotics Rash    Social History:   Social History   Socioeconomic History   Marital status: Single    Spouse name: Not on file   Number of children: 4   Years of education: Not on file   Highest education level: Not on file  Occupational History   Occupation: unemployed Therapist, sports retired  Tobacco Use   Smoking status: Former    Packs/day: 1.50    Years: 10.00    Total pack years: 15.00     Types: Cigarettes    Quit date: 05/19/1979    Years since quitting: 43.1   Smokeless tobacco: Never  Vaping Use   Vaping Use: Never used  Substance and Sexual Activity   Alcohol use: No   Drug use: No   Sexual activity: Not on file  Other Topics Concern   Not on file  Social History Narrative   Not on file   Social Determinants of Health   Financial Resource Strain: Not on file  Food Insecurity: No Food Insecurity (07/01/2022)   Hunger Vital Sign    Worried About Running Out of Food in the Last Year: Never true    Ran Out of Food in the Last Year: Never true  Transportation Needs: No Transportation Needs (07/01/2022)   PRAPARE - Hydrologist (Medical): No    Lack of Transportation (Non-Medical): No  Physical Activity: Not on file  Stress: Not on file  Social Connections: Not on file  Intimate Partner Violence: Not At Risk (07/01/2022)   Humiliation, Afraid, Rape, and Kick questionnaire    Fear of Current or Ex-Partner: No    Emotionally Abused: No    Physically Abused: No    Sexually Abused: No    Family History:    Family History  Problem Relation Age of Onset   Hypertension Father    Stroke Father    Hypertension Mother    Ovarian cancer Mother    Prostate cancer Brother    Thyroid disease Sister    Hypertension Sister    Leukemia Maternal Aunt      ROS:  Please see the history of present illness.   All other ROS reviewed and negative.     Physical Exam/Data:   Vitals:   07/13/22 0000 07/13/22 0300 07/13/22 0500 07/13/22 1246  BP: 125/70 133/60  (!) 154/69  Pulse: 96 89    Resp: 20 19    Temp: 98.6 F (37 C) 98.5 F (36.9 C)  (!) 97.5 F (36.4 C)  TempSrc: Oral Oral  Oral  SpO2: 95% 95%    Weight:   96 kg     Intake/Output Summary (Last 24 hours) at 07/13/2022 1625 Last data filed  at 07/13/2022 0400 Gross per 24 hour  Intake 300 ml  Output 250 ml  Net 50 ml      07/13/2022    5:00 AM 07/12/2022    5:00 AM 07/11/2022     6:00 PM  Last 3 Weights  Weight (lbs) 211 lb 10.3 oz 212 lb 15.4 oz 211 lb 13.8 oz  Weight (kg) 96 kg 96.6 kg 96.1 kg     Body mass index is 35.22 kg/m.  General:  Elderly female, comfortable, alert HEENT: normal Neck: no JVD Vascular: No carotid bruits; Distal pulses 2+ bilaterally Cardiac:  Tachycardic, regular, no murmurs Lungs:  Diminished on the right Abd: soft, nontender, no hepatomegaly  Ext: Trace, no edema Musculoskeletal:  No deformities, BUE and BLE strength normal and equal Skin: warm and dry  Neuro:  CNs 2-12 intact, no focal abnormalities noted Psych:  Normal affect   EKG:  The EKG was personally reviewed and demonstrates:  NSR with LVH Telemetry:  Telemetry was personally reviewed and demonstrates:  NSR-Sinus tachycardia   Relevant CV Studies: TTE 06/2022: IMPRESSIONS     1. Left ventricular ejection fraction, by estimation, is 50 to 55%. The  left ventricle has low normal function. The left ventricle has no regional  wall motion abnormalities. There is mild concentric left ventricular  hypertrophy. Left ventricular  diastolic parameters are consistent with Grade I diastolic dysfunction  (impaired relaxation).   2. Right ventricular systolic function is normal. The right ventricular  size is normal. There is normal pulmonary artery systolic pressure.   3. Mass along the mitral annulus. No evidence of mitral valve  regurgitation.   4. The aortic valve was not well visualized. Aortic valve regurgitation  is not visualized.   5. The inferior vena cava is normal in size with greater than 50%  respiratory variability, suggesting right atrial pressure of 3 mmHg.   Conclusion(s)/Recommendation(s): There is a mass along the mitral  annulus/windows are challenging, consider TEE if patient can tolerate.   Laboratory Data:  High Sensitivity Troponin:   Recent Labs  Lab 06/18/22 0803 06/18/22 1015  TROPONINIHS 11 10     Chemistry Recent Labs  Lab  07/07/22 0317 07/08/22 0431 07/09/22 0449 07/11/22 0246 07/12/22 0303 07/13/22 0410  NA 139 136   < > 136 134* 133*  K 3.7 3.7   < > 3.3* 3.1* 3.6  CL 109 106   < > 107 105 103  CO2 25 25   < > 24 22 19*  GLUCOSE 145* 142*   < > 125* 116* 110*  BUN 10 7*   < > 8 6* 6*  CREATININE 0.66 0.74   < > 0.86 0.88 0.79  CALCIUM 8.3* 8.1*   < > 8.1* 7.9* 8.1*  MG 1.8 1.9  --   --   --   --   GFRNONAA >60 >60   < > >60 >60 >60  ANIONGAP 5 5   < > '5 7 11   '$ < > = values in this interval not displayed.    Recent Labs  Lab 07/07/22 0317 07/08/22 0431  PROT 5.3* 5.1*  ALBUMIN 1.5* 1.5*  AST 60* 57*  ALT 43 45*  ALKPHOS 67 71  BILITOT 0.5 0.8   Lipids No results for input(s): "CHOL", "TRIG", "HDL", "LABVLDL", "LDLCALC", "CHOLHDL" in the last 168 hours.  Hematology Recent Labs  Lab 07/11/22 0246 07/12/22 0303 07/13/22 0410  WBC 12.4* 13.8* 10.3  RBC 4.44 3.96 4.09  HGB  12.2 11.1* 11.3*  HCT 37.4 32.8* 34.5*  MCV 84.2 82.8 84.4  MCH 27.5 28.0 27.6  MCHC 32.6 33.8 32.8  RDW 16.0* 16.0* 16.1*  PLT 72* 70* 80*   Thyroid No results for input(s): "TSH", "FREET4" in the last 168 hours.  BNPNo results for input(s): "BNP", "PROBNP" in the last 168 hours.  DDimer No results for input(s): "DDIMER" in the last 168 hours.   Radiology/Studies:  [facility] Chest Port 1 View  Result Date: 07/12/2022 CLINICAL DATA:  Leukocytosis. EXAM: PORTABLE CHEST 1 VIEW COMPARISON:  Chest radiograph 06/30/2022. FINDINGS: Mild cardiomegaly is unchanged. Stable mediastinal contours. Increase in patchy bibasilar airspace disease. There may be developing right upper lobe opacity. Small bilateral pleural effusions. No pneumothorax. IMPRESSION: 1. Increase in patchy bibasilar airspace disease, may be atelectasis or pneumonia. Possible developing right upper lobe opacity. 2. Small bilateral pleural effusions. Electronically Signed   By: Keith Rake M.D.   On: 07/12/2022 12:18   DG Fluoro Rm 1-60 Min - No  Report  Result Date: 07/10/2022 Fluoroscopy was utilized by the requesting physician.  No radiographic interpretation.     Assessment and Plan:   #Sinus Tachycardia: Likely due to underlying pneumonia, nausea, vomiting and poor PO intake. Also has not been able to tolerate metop due to hypotension and therefore there is likely some BB withdrawal. CTA in 06/18/22 with no evidence of PE. Discussed that this will continue to improve as she clinically recovers. Will continue with conservative management. Will arrange for outpatient follow-up.  #? MV Mass: Reviewed TTE on prior admission. Suspect this is just tissue coming into and out of plane. No mass seen on CTA. Blood cultures negative and no MR/MS. Can repeat TTE as outpatient for monitoring  #Chronic Diastolic HF: TTE with LVEF 50-55% with G1DD. Was dry on admission in the setting of nausea, vomiting, poor PO intake and PNA. BP soft requiring midodrine. Likely will need to resume diuretics prior to discharge. -Unable to add medical therapy due to relative hypotension  #HCAP: -ABX per primary  Extensive time spent talking to the patient and the family about her rapid HR. Reassured them this is likely to occur in the setting of above. Cardiology will sign-off. Will arrange for CV follow-up.    Risk Assessment/Risk Scores:    For questions or updates, please contact [place] Please consult [email or web address] for contact info under    Signed, Freada Bergeron, MD  07/13/2022 4:25 PM

## 2022-07-13 NOTE — Progress Notes (Addendum)
Foley catheter placed for acute urinary retention on 07/13/2022 per protocol.

## 2022-07-13 NOTE — Progress Notes (Signed)
Subjective: Patient denies nausea. Daughter present at bedside concerned about oral candidiasis, has questions whether the patient has esophageal candidiasis, states patient is going to be started on oral fluconazole for 3 days.  Objective: Vital signs in last 24 hours: Temp:  [97.4 F (36.3 C)-98.6 F (37 C)] 98.5 F (36.9 C) (02/26 0300) Pulse Rate:  [88-102] 89 (02/26 0300) Resp:  [16-32] 19 (02/26 0300) BP: (115-133)/(60-83) 133/60 (02/26 0300) SpO2:  [92 %-97 %] 95 % (02/26 0300) Weight:  [96 kg] 96 kg (02/26 0500) Weight change: -0.1 kg Last BM Date : 07/12/22  PE: Ill-appearing but not in distress GENERAL: Whitish patches noted over soft palate and posterior portion of tongue suspicious for oral candidiasis  ABDOMEN: Soft, nondistended abdomen, nontender, normoactive bowel sounds EXTREMITIES: No deformity  Lab Results: Results for orders placed or performed during the hospital encounter of 06/30/22 (from the past 48 hour(s))  Glucose, capillary     Status: Abnormal   Collection Time: 07/11/22 12:31 PM  Result Value Ref Range   Glucose-Capillary 128 (H) 70 - 99 mg/dL    Comment: Glucose reference range applies only to samples taken after fasting for at least 8 hours.  Glucose, capillary     Status: Abnormal   Collection Time: 07/11/22  3:42 PM  Result Value Ref Range   Glucose-Capillary 116 (H) 70 - 99 mg/dL    Comment: Glucose reference range applies only to samples taken after fasting for at least 8 hours.  CBC     Status: Abnormal   Collection Time: 07/12/22  3:03 AM  Result Value Ref Range   WBC 13.8 (H) 4.0 - 10.5 K/uL   RBC 3.96 3.87 - 5.11 MIL/uL   Hemoglobin 11.1 (L) 12.0 - 15.0 g/dL   HCT 32.8 (L) 36.0 - 46.0 %   MCV 82.8 80.0 - 100.0 fL   MCH 28.0 26.0 - 34.0 pg   MCHC 33.8 30.0 - 36.0 g/dL   RDW 16.0 (H) 11.5 - 15.5 %   Platelets 70 (L) 150 - 400 K/uL    Comment: Immature Platelet Fraction may be clinically indicated, consider ordering this additional  test [id number] REPEATED TO VERIFY    nRBC 0.0 0.0 - 0.2 %    Comment: Performed at [facility], [place] [address], [place], [place] [id number]  Basic metabolic panel     Status: Abnormal   Collection Time: 07/12/22  3:03 AM  Result Value Ref Range   Sodium 134 (L) 135 - 145 mmol/L   Potassium 3.1 (L) 3.5 - 5.1 mmol/L   Chloride 105 98 - 111 mmol/L   CO2 22 22 - 32 mmol/L   Glucose, Bld 116 (H) 70 - 99 mg/dL    Comment: Glucose reference range applies only to samples taken after fasting for at least 8 hours.   BUN 6 (L) 8 - 23 mg/dL   Creatinine, Ser 0.88 0.44 - 1.00 mg/dL   Calcium 7.9 (L) 8.9 - 10.3 mg/dL   GFR, Estimated >60 >60 mL/min    Comment: (NOTE) Calculated using the CKD-EPI Creatinine Equation (2021)    Anion gap 7 5 - 15    Comment: Performed at [facility] [address], [place], [place] [address]  Glucose, capillary     Status: Abnormal   Collection Time: 07/12/22  9:35 AM  Result Value Ref Range   Glucose-Capillary 148 (H) 70 - 99 mg/dL    Comment: Glucose reference range applies only to samples taken  after fasting for at least 8 hours.  Urinalysis, Routine w reflex microscopic -Urine, Catheterized     Status: Abnormal   Collection Time: 07/12/22  9:56 AM  Result Value Ref Range   Color, Urine AMBER (A) YELLOW    Comment: BIOCHEMICALS MAY BE AFFECTED BY COLOR   APPearance HAZY (A) CLEAR   Specific Gravity, Urine 1.021 1.005 - 1.030   pH 5.0 5.0 - 8.0   Glucose, UA NEGATIVE NEGATIVE mg/dL   Hgb urine dipstick NEGATIVE NEGATIVE   Bilirubin Urine NEGATIVE NEGATIVE   Ketones, ur NEGATIVE NEGATIVE mg/dL   Protein, ur 100 (A) NEGATIVE mg/dL   Nitrite NEGATIVE NEGATIVE   Leukocytes,Ua NEGATIVE NEGATIVE   RBC / HPF 0-5 0 - 5 RBC/hpf   WBC, UA 0-5 0 - 5 WBC/hpf   Bacteria, UA NONE SEEN NONE SEEN   Squamous Epithelial / HPF 0-5 0 - 5 /HPF   Mucus PRESENT    Hyaline Casts, UA PRESENT     Comment: Performed at [facility], [address],  [place], [place] [address]  Glucose, capillary     Status: None   Collection Time: 07/12/22  3:50 PM  Result Value Ref Range   Glucose-Capillary 98 70 - 99 mg/dL    Comment: Glucose reference range applies only to samples taken after fasting for at least 8 hours.  Glucose, capillary     Status: Abnormal   Collection Time: 07/12/22 10:53 PM  Result Value Ref Range   Glucose-Capillary 105 (H) 70 - 99 mg/dL    Comment: Glucose reference range applies only to samples taken after fasting for at least 8 hours.  CBC     Status: Abnormal   Collection Time: 07/13/22  4:10 AM  Result Value Ref Range   WBC 10.3 4.0 - 10.5 K/uL   RBC 4.09 3.87 - 5.11 MIL/uL   Hemoglobin 11.3 (L) 12.0 - 15.0 g/dL   HCT 34.5 (L) 36.0 - 46.0 %   MCV 84.4 80.0 - 100.0 fL   MCH 27.6 26.0 - 34.0 pg   MCHC 32.8 30.0 - 36.0 g/dL   RDW 16.1 (H) 11.5 - 15.5 %   Platelets 80 (L) 150 - 400 K/uL    Comment: Immature Platelet Fraction may be clinically indicated, consider ordering this additional test [id number] CONSISTENT WITH PREVIOUS RESULT REPEATED TO VERIFY    nRBC 0.0 0.0 - 0.2 %    Comment: Performed at [facility], [place] [address], [place], [place] [id number]  Basic metabolic panel     Status: Abnormal   Collection Time: 07/13/22  4:10 AM  Result Value Ref Range   Sodium 133 (L) 135 - 145 mmol/L   Potassium 3.6 3.5 - 5.1 mmol/L   Chloride 103 98 - 111 mmol/L   CO2 19 (L) 22 - 32 mmol/L   Glucose, Bld 110 (H) 70 - 99 mg/dL    Comment: Glucose reference range applies only to samples taken after fasting for at least 8 hours.   BUN 6 (L) 8 - 23 mg/dL   Creatinine, Ser 0.79 0.44 - 1.00 mg/dL   Calcium 8.1 (L) 8.9 - 10.3 mg/dL   GFR, Estimated >60 >60 mL/min    Comment: (NOTE) Calculated using the CKD-EPI Creatinine Equation (2021)    Anion gap 11 5 - 15    Comment: Performed at [facility] [address] [place],  [address]    Studies/Results: DG Chest Port 1 View  Result  Date:  07/12/2022 CLINICAL DATA:  Leukocytosis. EXAM: PORTABLE CHEST 1 VIEW COMPARISON:  Chest radiograph 06/30/2022. FINDINGS: Mild cardiomegaly is unchanged. Stable mediastinal contours. Increase in patchy bibasilar airspace disease. There may be developing right upper lobe opacity. Small bilateral pleural effusions. No pneumothorax. IMPRESSION: 1. Increase in patchy bibasilar airspace disease, may be atelectasis or pneumonia. Possible developing right upper lobe opacity. 2. Small bilateral pleural effusions. Electronically Signed   By: Keith Rake M.D.   On: 07/12/2022 12:18    Medications: I have reviewed the patient's current medications.  Assessment: Nausea-resolved Constipation-resolved Oral candidiasis  Multiple comorbidities-encephalopathy: Improving, congestive heart failure, chronic hypoxic respiratory failure, hypertension, diabetes  Plan: As per patient, primary team is planning to start fluconazole 100 mg p.o. daily for 3 days. Since patient has resolution of nausea and constipation, no further GI intervention recommended. Patient does not have dysphagia or odynophagia, esophageal candidiasis less likely, however fluconazole should cover for esophageal candidiasis as well. GI will sign off, please recall if needed.  Ronnette Juniper, MD 07/13/2022, 10:13 AM

## 2022-07-13 NOTE — Progress Notes (Addendum)
PROGRESS NOTE  Barbara Nelson [id number] DOB: 1941/02/11   PCP: Cari Caraway, MD  Patient is from: SNF  DOA: 06/30/2022 LOS: 83  Chief complaints Chief Complaint  Patient presents with   Fatigue   Abdominal Pain   Emesis     Brief Narrative / Interim history:  82 year old F with PMH of diastolic CHF, chronic hypoxic RF on 2 L, HTN, and recent hospitalization from 2/1-10 with CAP and acute on chronic CHF returning with altered mental status, lethargy and vomiting, and admitted with working diagnosis of HCAP and encephalopathy.  Family denies history of dementia or cognitive impairment.  They significant decline in cognition since she was diagnosed with shingles in January and started on gabapentin.  She was weaned off gabapentin but she continued to decline, even worse after her recent hospitalization.  She is currently not on any sedating medication.  In ED, slightly hypertensive.  Not febrile.  Saturating in upper 90s to 100 on 2 L.  No leukocytosis.  Hgb 15.2 (12.4 about 12 days ago).  Slightly elevated LFT and Cr.  Lipase, BNP, ammonia, TSH, CK and procalcitonin within normal.  Lactic acid 2.1.  VBG suggess mild metabolic alkalosis.  UDS negative.  UA with > 300 protein.  XR with minimal strandy and hazy opacity at both lung bases suggesting atelectasis or infection, small left pleural effusion and stable cardiomegaly.  CT abdomen and pelvis with contrast showed large amount of stool in the rectum concerning for impaction.  She had disimpaction in ED.  CT head without acute finding.  MRI brain without acute finding but limited by motion.  Patient was started on IVF, IV cefepime and vancomycin and admitted with working diagnosis of HCAP, encephalopathy and dehydration.  The next day, pneumonia or infection felt to be less likely.  Blood cultures negative.  Antibiotic discontinued.  Further encephalopathy workup including ABG, UDS, B1 and B12 unrevealing.  Neurology consulted and  she had lumbar puncture.  CSF studies unrevealing so far.  EEG suggest moderate diffuse encephalopathy but no seizure or epileptiform discharge.  Started on high-dose thiamine without significant improvement.  Family requested transfer to [place] but both at full capacity.  Fortunately, encephalopathy improved.  GI following for constipation.  As well nausea and vomiting.   Subjective:  No nausea, no vomiting, appetite has improved, no further urinary incontinence overnight   Objective: Vitals:   07/13/22 0000 07/13/22 0300 07/13/22 0500 07/13/22 1246  BP: 125/70 133/60  (!) 154/69  Pulse: 96 89    Resp: 20 19    Temp: 98.6 F (37 C) 98.5 F (36.9 C)  (!) 97.5 F (36.4 C)  TempSrc: Oral Oral  Oral  SpO2: 95% 95%    Weight:   96 kg     Examination:   Awake Alert, frail, deconditioned, in no apparent distress Symmetrical Chest wall movement, Diminished air entry at the bases RRR,No Gallops,Rubs or new Murmurs, No Parasternal Heave +ve B.Sounds, Abd Soft, No tenderness, No rebound - guarding or rigidity. No Cyanosis, Clubbing or edema, left upper extremity erythema and swelling   Procedures:  2/15-lumbar puncture.  Microbiology summarized: [id number], influenza and RSV PCR nonreactive MRSA PCR screen nonreactive Blood cultures NGTD CSF cultures NGTD.  Assessment and plan: Principal Problem:   Acute metabolic encephalopathy Active Problems:   Acute hypoxic respiratory failure (HCC)   Essential hypertension   Hypercalcemia   Dehydration   Acute prerenal azotemia   Transaminitis   Constipation   DM2 (diabetes mellitus,  type 2) (HCC)   Allergic rhinitis   Chronic diastolic CHF (congestive heart failure) (HCC)   Nausea and vomiting   Pressure injury of skin  Acute metabolic encephalopathy:  - Initially felt to be dehydration and delirium but mental status worse despite hydration.  - Encephalopathy workup including CT head, MRI brain, ammonia, TSH, VBG, UDS, B1,  B12, RPR, CSF study and EEG unrevealing.   - Now awake and alert and oriented to self and family. -Reorientation and delirium precaution -Completed high-dose thiamine. -SLP advanced diet to dysphagia 2 with thin liquid -Fall and aspiration precaution. -[facility] called for transfer but at capacity. -Neurology available as needed  Vomiting/constipation/possible fecal impaction:  -  CT without acute finding other than fecal impaction.  RUQ [place] negative. S/p disimpaction in ED. -Appreciate input by GI-enema and bowel regimen -Constipation/impaction has resolved, she is with good BM -Barium swallow was ordered ,patient was not cooperative, it was unable to be performed -No vomiting over the last 74 days -Patient started on Diflucan for oral thrush, will treat for 3 days, felt by GI to have candidiasis.  Meanwhile continue with Magic mouthwash.  Chronic diastolic CHF - TTE on 2/2 with LVEF of 50 to 55% and G1-DD.  She was somewhat dehydrated on presentation.  She seems to be on Lasix.  No CHF on chest x-ray. -No oral intake is more reliable will DC IV fluids -Continue to hold Lasix.  Resume once her oral intake has improved -Patient on home low-dose metoprolol, has been resumed given sinus tachycardia -Her blood pressure remains on the lower side, continue with midodrine -Cardiology were consulted upon family request  Chronic hypoxic respiratory failure:  -On 2 L at baseline.  Currently on room air. -Minimum oxygen to keep saturation above 90%. -Try incentive spirometry and pulmonary toilet. -Started on midodrine for soft blood pressure, continue with low-dose Toprol-XL for sinus tachycardia  HCAP/pneumonia ruled out:  - No fever, leukocytosis.  Procalcitonin within normal.  COVID-19, influenza and RSV PCR nonreactive.  MRSA PCR negative.  Full RVP negative.  Blood cultures NGTD.  Cough likely due to silent aspiration in the setting of encephalopathy and vomiting. -Appreciate input by  pulmonology. -Continue aspiration precaution and pulmonary toilet. -With worsening leukocytosis, chest x-ray was obtained which did show worsening lower extremity atelectasis versus pneumonia, as well has new opacity infiltrate, on Rocephin and doxycycline, follow-up blood cultures  Urinary retention -Required in and out 2/25, so far no recurrence, UA is negative, continue to monitor bladder scan every 8 hours.  Left upper extremity cellulitis -Patient with left upper extremity erythema at IV site, appears has infiltrated, continue with warm compress, topical antibiotics and IV antibiotics.  Dehydration: Likely due to poor intake and vomiting.  Seems to have resolved with IV hydration. -Decrease IV fluid.  Controlled IDDM-2 with hyperglycemia: A1c 6.9%. -Continue current insulin regimen  Elevated LFT:  CK within normal.  LFT improved.  RUQ [place] negative.  Stable. -Continue monitoring  Hypokalemia/hypophosphatemia -Monitor replenish as appropriate.  Hypercalcemia: Likely secondary due to dehydration.  Resolved with hydration.  Thrombocytopenia:  - Platelet continues to decline.  -Discontinued Lovenox -HIT is negative -SCD for VTE prophylaxis  Generalized weakness/lethargy: In the setting of encephalopathy as above. -PT/OT eval as above   Obesity/decreased oral intake: Decreased oral intake likely due to encephalopathy.  Prealbumin 11.  Resistant NG tube insertion on 2/16.  Now encephalopathy resolved.  Diet advanced to dysphagia 1. Body mass index is 35.22 kg/m. Nutrition Problem: Inadequate  oral intake Etiology: lethargy/confusion, dysphagia Signs/Symptoms: NPO status (pocketing/holding food) Interventions: Boost Breeze, MVI   DVT prophylaxis:  Place and maintain sequential compression device Start: 07/09/22 0658 Place and maintain sequential compression device Start: 07/05/22 0743 SCDs Start: 06/30/22 2019  Code Status: Full code Family Communication: D/W daughter at  bedside Level of care: Med-Surg Status is: Inpatient Remains inpatient appropriate because: Acute metabolic encephalopathy, dehydration and lethargy.   Final disposition: SNF. Consultants:  Neurology Pulmonology Gastroenterology   Sch Meds:  Scheduled Meds:  bisacodyl  10 mg Rectal Daily   doxycycline  100 mg Oral Q12H   feeding supplement  1 Container Oral BID BM   fluconazole  100 mg Oral Daily   fluticasone  2 spray Each Nare q morning   insulin aspart  0-6 Units Subcutaneous TID WC   lidocaine   Topical TID   magic mouthwash  5 mL Oral QID   magnesium citrate  1 Bottle Oral Once   metoprolol succinate  12.5 mg Oral QHS   midodrine  5 mg Oral TID WC   multivitamin with minerals  1 tablet Oral Daily   polyethylene glycol  17 g Oral BID   potassium chloride  40 mEq Oral Once   thiamine (VITAMIN B1) injection  100 mg Intravenous Daily   Continuous Infusions:  cefTRIAXone (ROCEPHIN)  IV 2 g (07/13/22 1354)   PRN Meds:.acetaminophen **OR** acetaminophen, bisacodyl, gadobutrol, guaiFENesin, ipratropium-albuterol, menthol-cetylpyridinium, milk and molasses, ondansetron (ZOFRAN) IV  Antimicrobials: Anti-infectives (From admission, onward)    Start     Dose/Rate Route Frequency Ordered Stop   07/13/22 1115  fluconazole (DIFLUCAN) tablet 100 mg        100 mg Oral Daily 07/13/22 1018 07/16/22 0959   07/12/22 1415  doxycycline (VIBRA-TABS) tablet 100 mg        100 mg Oral Every 12 hours 07/12/22 1319     07/12/22 1400  ceFAZolin (ANCEF) IVPB 1 g/50 mL premix  Status:  Discontinued        1 g 100 mL/hr over 30 Minutes Intravenous Every 8 hours 07/12/22 1054 07/12/22 1319   07/12/22 1400  cefTRIAXone (ROCEPHIN) 2 g in sodium chloride 0.9 % 100 mL IVPB        2 g 200 mL/hr over 30 Minutes Intravenous Every 24 hours 07/12/22 1319     07/10/22 1400  cephALEXin (KEFLEX) 250 MG/5ML suspension 500 mg  Status:  Discontinued        500 mg Oral Every 8 hours 07/10/22 1049 07/12/22 1054    07/04/22 1730  fluconazole (DIFLUCAN) tablet 150 mg  Status:  Discontinued        150 mg Oral  Once 07/04/22 1633 07/08/22 1024   07/01/22 2200  vancomycin (VANCOCIN) IVPB 1000 mg/200 mL premix  Status:  Discontinued        1,000 mg 200 mL/hr over 60 Minutes Intravenous Every 24 hours 06/30/22 2136 07/01/22 1047   07/01/22 0800  ceFEPIme (MAXIPIME) 2 g in sodium chloride 0.9 % 100 mL IVPB  Status:  Discontinued        2 g 200 mL/hr over 30 Minutes Intravenous Every 8 hours 07/01/22 0735 07/01/22 1047   06/30/22 2145  ceFEPIme (MAXIPIME) 2 g in sodium chloride 0.9 % 100 mL IVPB  Status:  Discontinued        2 g 200 mL/hr over 30 Minutes Intravenous Every 12 hours 06/30/22 2136 07/01/22 0735   06/30/22 2145  vancomycin (VANCOREADY) IVPB 2000 mg/400 mL  2,000 mg 200 mL/hr over 120 Minutes Intravenous  Once 06/30/22 2136 07/01/22 0204        I have personally reviewed the following labs and images: CBC: Recent Labs  Lab 07/09/22 0449 07/10/22 0611 07/11/22 0246 07/12/22 0303 07/13/22 0410  WBC 7.2 11.0* 12.4* 13.8* 10.3  HGB 12.4 13.0 12.2 11.1* 11.3*  HCT 38.0 40.3 37.4 32.8* 34.5*  MCV 84.8 85.4 84.2 82.8 84.4  PLT 69* 65* 72* 70* 80*   BMP &GFR Recent Labs  Lab 07/07/22 0317 07/08/22 0431 07/09/22 0449 07/10/22 0556 07/10/22 0611 07/11/22 0246 07/12/22 0303 07/13/22 0410  NA 139 136 138  --  137 136 134* 133*  K 3.7 3.7 3.5  --  3.5 3.3* 3.1* 3.6  CL 109 106 105  --  106 107 105 103  CO2 '25 25 25  '$ --  '22 24 22 '$ 19*  GLUCOSE 145* 142* 114*  --  102* 125* 116* 110*  BUN 10 7* <5*  --  6* 8 6* 6*  CREATININE 0.66 0.74 0.74  --  0.91 0.86 0.88 0.79  CALCIUM 8.3* 8.1* 8.4*  --  8.4* 8.1* 7.9* 8.1*  MG 1.8 1.9  --   --   --   --   --   --   PHOS 2.6 2.1*  --  2.5  --   --   --   --    Estimated Creatinine Clearance: 62.1 mL/min (by C-G formula based on SCr of 0.79 mg/dL). Liver & Pancreas: Recent Labs  Lab 07/07/22 0317 07/08/22 0431  AST 60* 57*  ALT  43 45*  ALKPHOS 67 71  BILITOT 0.5 0.8  PROT 5.3* 5.1*  ALBUMIN 1.5* 1.5*   No results for input(s): "LIPASE", "AMYLASE" in the last 168 hours.  No results for input(s): "AMMONIA" in the last 168 hours.  Diabetic: No results for input(s): "HGBA1C" in the last 72 hours. Recent Labs  Lab 07/12/22 1200 07/12/22 1550 07/12/22 2253 07/13/22 1035 07/13/22 1252  GLUCAP 123* 98 105* 109* 105*   Cardiac Enzymes: Recent Labs  Lab 07/07/22 0317  CKTOTAL 31*   No results for input(s): "PROBNP" in the last 8760 hours. Coagulation Profile: No results for input(s): "INR", "PROTIME" in the last 168 hours.  Thyroid Function Tests: No results for input(s): "TSH", "T4TOTAL", "FREET4", "T3FREE", "THYROIDAB" in the last 72 hours.  Lipid Profile: No results for input(s): "CHOL", "HDL", "LDLCALC", "TRIG", "CHOLHDL", "LDLDIRECT" in the last 72 hours. Anemia Panel: No results for input(s): "VITAMINB12", "FOLATE", "FERRITIN", "TIBC", "IRON", "RETICCTPCT" in the last 72 hours.  Urine analysis:    Component Value Date/Time   COLORURINE AMBER (A) 07/12/2022 0956   APPEARANCEUR HAZY (A) 07/12/2022 0956   LABSPEC 1.021 07/12/2022 0956   PHURINE 5.0 07/12/2022 0956   GLUCOSEU NEGATIVE 07/12/2022 0956   HGBUR NEGATIVE 07/12/2022 0956   BILIRUBINUR NEGATIVE 07/12/2022 0956   KETONESUR NEGATIVE 07/12/2022 0956   PROTEINUR 100 (A) 07/12/2022 0956   UROBILINOGEN 0.2 09/15/2011 0530   NITRITE NEGATIVE 07/12/2022 0956   LEUKOCYTESUR NEGATIVE 07/12/2022 0956   Sepsis Labs: Invalid input(s): "PROCALCITONIN", "LACTICIDVEN"  Microbiology: Recent Results (from the past 240 hour(s))  Culture, blood (Routine X 2) w Reflex to ID Panel     Status: None (Preliminary result)   Collection Time: 07/12/22 10:45 AM   Specimen: BLOOD RIGHT HAND  Result Value Ref Range Status   Specimen Description BLOOD RIGHT HAND  Final   Special Requests   Final  BOTTLES DRAWN AEROBIC AND ANAEROBIC Blood Culture  results may not be optimal due to an inadequate volume of blood received in culture bottles   Culture   Final    NO GROWTH < 24 HOURS Performed at [facility] [address] [place], [place] [address]    Report Status PENDING  Incomplete  Culture, blood (Routine X 2) w Reflex to ID Panel     Status: None (Preliminary result)   Collection Time: 07/12/22 10:54 AM   Specimen: BLOOD LEFT HAND  Result Value Ref Range Status   Specimen Description BLOOD LEFT HAND  Final   Special Requests   Final    BOTTLES DRAWN AEROBIC ONLY Blood Culture results may not be optimal due to an inadequate volume of blood received in culture bottles   Culture   Final    NO GROWTH < 24 HOURS Performed at [facility], [place] [address], [place], [place] [address]    Report Status PENDING  Incomplete    Radiology Studies: No results found.    Phillips Climes MD [facility]  If 7PM-7AM, please contact night-coverage [email or web address] 07/13/2022, 2:14 PM

## 2022-07-14 DIAGNOSIS — R112 Nausea with vomiting, unspecified: Secondary | ICD-10-CM | POA: Diagnosis not present

## 2022-07-14 DIAGNOSIS — J189 Pneumonia, unspecified organism: Secondary | ICD-10-CM | POA: Diagnosis not present

## 2022-07-14 DIAGNOSIS — G9341 Metabolic encephalopathy: Secondary | ICD-10-CM | POA: Diagnosis not present

## 2022-07-14 LAB — CBC
HCT: 33.6 % — ABNORMAL LOW (ref 36.0–46.0)
Hemoglobin: 11.2 g/dL — ABNORMAL LOW (ref 12.0–15.0)
MCH: 27.6 pg (ref 26.0–34.0)
MCHC: 33.3 g/dL (ref 30.0–36.0)
MCV: 82.8 fL (ref 80.0–100.0)
Platelets: 90 10*3/uL — ABNORMAL LOW (ref 150–400)
RBC: 4.06 MIL/uL (ref 3.87–5.11)
RDW: 16 % — ABNORMAL HIGH (ref 11.5–15.5)
WBC: 8.9 10*3/uL (ref 4.0–10.5)
nRBC: 0 % (ref 0.0–0.2)

## 2022-07-14 LAB — BASIC METABOLIC PANEL
Anion gap: 8 (ref 5–15)
BUN: 7 mg/dL — ABNORMAL LOW (ref 8–23)
CO2: 20 mmol/L — ABNORMAL LOW (ref 22–32)
Calcium: 8 mg/dL — ABNORMAL LOW (ref 8.9–10.3)
Chloride: 102 mmol/L (ref 98–111)
Creatinine, Ser: 0.83 mg/dL (ref 0.44–1.00)
GFR, Estimated: >60 mL/min (ref >60)
Glucose, Bld: 83 mg/dL (ref 70–99)
Potassium: 3 mmol/L — ABNORMAL LOW (ref 3.5–5.1)
Sodium: 130 mmol/L — ABNORMAL LOW (ref 135–145)

## 2022-07-14 LAB — GLUCOSE, CAPILLARY
Glucose-Capillary: 79 mg/dL (ref 70–99)
Glucose-Capillary: 136 mg/dL — ABNORMAL HIGH (ref 70–99)
Glucose-Capillary: 115 mg/dL — ABNORMAL HIGH (ref 70–99)
Glucose-Capillary: 96 mg/dL (ref 70–99)

## 2022-07-14 MED ORDER — POTASSIUM CHLORIDE 20 MEQ PO PACK
20.0000 meq | PACK | Freq: Once | ORAL | Status: AC
  Administered 2022-07-14: 20 meq via ORAL
  Filled 2022-07-14: qty 1

## 2022-07-14 MED ORDER — ENSURE MAX PROTEIN PO LIQD
11.0000 [oz_av] | Freq: Two times a day (BID) | ORAL | Status: DC
  Administered 2022-07-14 – 2022-07-16 (×3): 11 [oz_av] via ORAL
  Filled 2022-07-14 (×6): qty 330

## 2022-07-14 MED ORDER — POTASSIUM CHLORIDE 20 MEQ PO PACK
40.0000 meq | PACK | Freq: Once | ORAL | Status: AC
  Administered 2022-07-14: 40 meq via ORAL
  Filled 2022-07-14: qty 2

## 2022-07-14 MED ORDER — POTASSIUM CHLORIDE 10 MEQ/100ML IV SOLN
10.0000 meq | INTRAVENOUS | Status: AC
  Administered 2022-07-14 (×4): 10 meq via INTRAVENOUS
  Filled 2022-07-14 (×4): qty 100

## 2022-07-14 MED ORDER — CHLORHEXIDINE GLUCONATE CLOTH 2 % EX PADS
6.0000 | MEDICATED_PAD | Freq: Every day | CUTANEOUS | Status: DC
  Administered 2022-07-14 – 2022-07-15 (×2): 6 via TOPICAL

## 2022-07-14 NOTE — Progress Notes (Signed)
Nutrition Follow-up  DOCUMENTATION CODES:   Not applicable  INTERVENTION:   Continue Multivitamin w/ minerals daily Discontinue Boost Breeze po BID, replace with Ensure Max po BID, each supplement provides 150 kcal and 30 grams of protein.  Feeding assist with all meals Meal ordering with assist Extra gravy with meals  NUTRITION DIAGNOSIS:   Inadequate oral intake related to lethargy/confusion, dysphagia as evidenced by NPO status (pocketing/holding food). - Progressing, now on diet  GOAL:   Patient will meet greater than or equal to 90% of their needs - Ongoing  MONITOR:   PO intake, Supplement acceptance, Labs, I & O's  REASON FOR ASSESSMENT:   Consult Assessment of nutrition requirement/status  ASSESSMENT:   82 yo female admitted with acute metabolic encephalopathy with dehydration, vomiting likely secondary to fecal impaction. PMH include chronic CHF, chronic respiratory failure, controlled DM 2  2/13 - Admitted 2/16 - NPO 2/17 - diet advanced to clear liquids 2/19 - diet advanced to full liquids 2/20 - diet advanced to Dysphagia 1 2/21 - diet advanced to Dysphagia 2  Pt sleeping in chair at time of visit, family at bedside. Reports that she has been doing a lot of broth and mashed potatoes. Willing to try the salmon and tilapia for lunch and dinner. Has not tried to really chew any foods since diet upgrade. Boost Breeze supplements are too sweet and pt has not been drinking much. MD provided pt a coffee flavored Premier Protein. Went back and discussed pt and daughter ok with Ensure Max vanilla and mixing in some coffee to give the same flavor of the Premier Protein shake.  Daughter inquired about additional ways to increase protein intake. Discussed when getting home can add unflavored protein powder to foods as an option. Discussed the importance of just getting her to eat foods that she likes.  Meal Intake 2/23-2/25: 0-50% x 5 meals   Medications reviewed and  include: Doculax, Doxycycline, Diflucan, NovoLog SSI, Magic Mouthwash, MVI, Miralax, Thiamine, IV antibiotics  Labs reviewed: Sodium 130, Potassium 3.0, 24 hr CBGs 79-136  Diet Order:   Diet Order             DIET DYS 2 Room service appropriate? Yes with Assist; Fluid consistency: Thin  Diet effective now                   EDUCATION NEEDS:   Education needs have been addressed  Skin:  Skin Assessment: Skin Integrity Issues: Skin Integrity Issues:: Other (Comment) Other: wound to right buttocks (no other details or staging documented at this time)  Last BM:  2/27 - Type 5  Height:  Ht Readings from Last 1 Encounters:  06/18/22 '5\' 5"'$  (1.651 m)   Weight:  Wt Readings from Last 1 Encounters:  07/14/22 96.3 kg   Ideal Body Weight:  56.8 kg  BMI:  Body mass index is 35.33 kg/m.  Estimated Nutritional Needs:  Kcal:  1550-1750 kcals Protein:  75-85 g Fluid:  >/= 1.5 L   Hermina Barters RD, LDN Clinical Dietitian See [facility] for contact information.

## 2022-07-14 NOTE — TOC Progression Note (Addendum)
Transition of Care [facility]) - Progression Note    Patient Details  Name: Barbara Nelson MRN: [id number] Date of Birth: July 14, 1940  Transition of Care [facility]) CM/SW Contact  Carles Collet, RN Phone Number: 07/14/2022, 2:44 PM  Clinical Narrative:     Met with patient (in recliner) and daughter Vanetta Mulders to discuss DC plans. She confirms that the patient will return home to her sister's house: [facility] [address] [name] [address] She will stay on the first floor and only ave the door threshold to get over, no stairs.  We confirmed patient will need hospital bed, hoyer, and bedside commode for home. The family would like to get their own wheelchair and we discussed placed to obtain one, and patient has a RW at home already.  Family is working on setting up private duty care, as it has been explained that skilled services, through home health only make appointments for services in the home a few times a week lasting at most one hour.  Patient had [facility] for home health in December prior to hospitalizations and SNF stay. Vanetta Mulders states she would like to speak with her sister about agency. TOC will follow up. Anticipated DC Thursday  16:00 Followed up at bedside and spoke w daughter Freda Munro, choice for [facility] agency confirmed w [facility], referral made, and DME needs confirmed. She states that her spouse works from home and will be able to accept DME at any time, they will work on clearing a Merchandiser, retail and were informed on tentative DC plan Thursday.  Referral made to [facility] for [facility] PT OT RN AIDE, Patient will need [place].   Expected Discharge Plan: [place] Barriers to Discharge: Continued Medical Work up  Expected Discharge Plan and Services In-house Referral: Clinical Social Work Discharge Planning Services: CM Consult   Living arrangements for the past 2 months: Single Family Home                                        Social Determinants of Health (SDOH) Interventions SDOH Screenings   Food Insecurity: No Food Insecurity (07/01/2022)  Housing: Low Risk  (07/01/2022)  Transportation Needs: No Transportation Needs (07/01/2022)  Utilities: Not At Risk (07/01/2022)  Tobacco Use: Medium Risk (07/01/2022)    Readmission Risk Interventions     No data to display

## 2022-07-14 NOTE — Plan of Care (Signed)
  Problem: Fluid Volume: Goal: Ability to maintain a balanced intake and output will improve Outcome: Progressing   Problem: Metabolic: Goal: Ability to maintain appropriate glucose levels will improve Outcome: Progressing   Problem: Nutritional: Goal: Maintenance of adequate nutrition will improve Outcome: Progressing

## 2022-07-14 NOTE — TOC Initial Note (Deleted)
Transition of Care [facility]) - Initial/Assessment Note    Patient Details  Name: Barbara Nelson MRN: [id number] Date of Birth: September 07, 1940  [facility] CM/SW Contact:    Carles Collet, RN Phone Number: 07/14/2022, 4:31 PM  Clinical Narrative:                   Expected Discharge Plan: [place] Barriers to Discharge: Continued Medical Work up   Patient Goals and CMS Choice Patient states their goals for this hospitalization and ongoing recovery are:: to return home at [place] Medicare.gov Compare Post Acute Care list provided to:: Patient Represenative (must comment) Choice offered to / list presented to : Adult Children (daughter Vanetta Mulders) [place] ownership interest in [facility].provided to:: Adult Children    Expected Discharge Plan and Services In-house Referral: Clinical Social Work Discharge Planning Services: CM Consult   Living arrangements for the past 2 months: Single Family Home                                      Prior Living Arrangements/Services Living arrangements for the past 2 months: Single Family Home Lives with:: Adult Children Patient language and need for interpreter reviewed:: Yes Do you feel safe going back to the place where you live?: Yes      Need for Family Participation in Patient Care: Yes (Comment) Care giver support system in place?: Yes (comment) Current home services: DME Criminal Activity/Legal Involvement Pertinent to Current Situation/Hospitalization: No - Comment as needed  Activities of Daily Living   ADL Screening (condition at time of admission) Patient's cognitive ability adequate to safely complete daily activities?: No Is the patient deaf or have difficulty hearing?: No Does the patient have difficulty seeing, even when wearing glasses/contacts?: No Does the patient have difficulty concentrating, remembering, or making decisions?: Yes Patient able to express need for assistance  with ADLs?: No Does the patient have difficulty dressing or bathing?: Yes Independently performs ADLs?: No Communication: Needs assistance Is this a change from baseline?: Pre-admission baseline Dressing (OT): Needs assistance Is this a change from baseline?: Pre-admission baseline Grooming: Needs assistance Is this a change from baseline?: Pre-admission baseline Feeding: Needs assistance Is this a change from baseline?: Pre-admission baseline Bathing: Needs assistance Is this a change from baseline?: Pre-admission baseline Toileting: Needs assistance Is this a change from baseline?: Pre-admission baseline In/Out Bed: Needs assistance Is this a change from baseline?: Pre-admission baseline Walks in Home: Needs assistance Is this a change from baseline?: Pre-admission baseline Does the patient have difficulty walking or climbing stairs?: Yes Weakness of Legs: Both Weakness of Arms/Hands: Both  Permission Sought/Granted Permission sought to share information with : Facility Sport and exercise psychologist, Family Supports Permission granted to share information with : Yes, Verbal Permission Granted  Share Information with NAME: Freda Munro     Permission granted to share info w Relationship: Daughter  Permission granted to share info w Contact Information: [phone]  Emotional Assessment Appearance:: Appears stated age Attitude/Demeanor/Rapport: Unable to Assess Affect (typically observed): Unable to Assess Orientation: : Oriented to Self Alcohol / Substance Use: Not Applicable Psych Involvement: No (comment)  Admission diagnosis:  Altered mental status, unspecified altered mental status type [id number] Acute metabolic encephalopathy [id number] Nausea and vomiting, unspecified vomiting type [R11.2] Patient Active Problem List   Diagnosis Date Noted   Nausea and vomiting 07/10/2022   Pressure injury of skin 07/10/2022  Unspecified atrial fibrillation ([place]) Q000111Q   Acute metabolic  encephalopathy Q000111Q   Hypercalcemia 06/30/2022   Dehydration 06/30/2022   Acute prerenal azotemia 06/30/2022   Transaminitis 06/30/2022   Constipation 06/30/2022   DM2 (diabetes mellitus, type 2) ([place]) 06/30/2022   Allergic rhinitis 06/30/2022   Chronic diastolic CHF (congestive heart failure) ([place]) 06/30/2022   Shortness of breath 06/20/2022   Acute hypoxic respiratory failure ([place]) 06/18/2022   Hyponatremia 06/18/2022   Body mass index (BMI) 39.0-39.9, adult 01/05/2022   Decreased estrogen level 01/05/2022   Prediabetes 01/05/2022   Dry eyes 01/05/2022   History of pericarditis 01/05/2022   Essential hypertension 01/05/2022   LBBB (left bundle branch block) 09/17/2017   Benign essential HTN 10/31/2015   Heart palpitations 10/31/2015   Lung mass 06/27/2013   Edema of extremities 06/27/2013   Pericarditis, viral    History of pericardiectomy 06/19/2013   Chronic pulmonary blastomycosis ([place]) 08/23/2012   Pulmonary nodule, right 03/09/2012   Pericardial effusion/ pericarditis 10/06/2011   PCP:  Cari Caraway, MD Pharmacy:   [id number] [place] [id number]: [phone]: [phone]    Social Determinants of Health (SDOH) Social History: SDOH Screenings   Food Insecurity: No Food Insecurity (07/01/2022)  Housing: Low Risk  (07/01/2022)  Transportation Needs: No Transportation Needs (07/01/2022)  Utilities: Not At Risk (07/01/2022)  Tobacco Use: Medium Risk (07/01/2022)   SDOH Interventions:     Readmission Risk Interventions     No data to display

## 2022-07-14 NOTE — Progress Notes (Signed)
Physical Therapy Treatment Patient Details Name: Barbara Nelson MRN: [id number] DOB: 10-30-1940 Today's Date: 07/14/2022   History of Present Illness Pt is an 82 y/o female admitted for AMS in setting of HCAP PNA. Also noted dehydration and fecal impaction. Recent admission 2/1-2/10 for same. PMH: DM2, HTN, CHF.    PT Comments    Pt tolerated today's session well, more participatory and alert throughout session. Utilized Stedy to transfer pt to chair, able to stand from chair with modAx2 and 2HHA, but has significant posterior lean, unable to correct with facilitation and cues. Pt performed seated TherEx prior to transferring to the chair as family reports she likes to warm up her legs prior to standing. Pt with good upper body strength to pull into standing with Stedy but continues to require modAx2. Pt will continue to benefit from skilled acute PT to progress mobility, balance, and strength, discharge plan remains appropriate.     Recommendations for follow up therapy are one component of a multi-disciplinary discharge planning process, led by the attending physician.  Recommendations may be updated based on patient status, additional functional criteria and insurance authorization.  Follow Up Recommendations  Skilled nursing-short term rehab (<3 hours/day) Can patient physically be transported by private vehicle: No   Assistance Recommended at Discharge Frequent or constant Supervision/Assistance  Patient can return home with the following Assistance with cooking/housework;Assist for transportation;Help with stairs or ramp for entrance;Two people to help with walking and/or transfers;Two people to help with bathing/dressing/bathroom;Direct supervision/assist for medications management   Equipment Recommendations  None recommended by PT    Recommendations for Other Services       Precautions / Restrictions Precautions Precautions: Fall;Other (comment) Precaution Comments: monitor  O2, HR Restrictions Weight Bearing Restrictions: No     Mobility  Bed Mobility Overal bed mobility: Needs Assistance Bed Mobility: Supine to Sit     Supine to sit: Mod assist, +2 for physical assistance, HOB elevated     General bed mobility comments: assist for BLE management and trunk support, use of bed pads for scooting hips forwards    Transfers Overall transfer level: Needs assistance Equipment used: Ambulation equipment used, 2 person hand held assist Transfers: Sit to/from Stand, Bed to chair/wheelchair/BSC Sit to Stand: Mod assist, +2 physical assistance           General transfer comment: modAx2 to stand with [facility] for transfer to bedside chair. Pt stood x1 trial from chair with [place] and modAx2 for power up, cueing for hip extension and decreasing posterior lean but pt unable to correct with facilitation Transfer via Lift Equipment: Stedy  Ambulation/Gait                   Stairs             Wheelchair Mobility    Modified Rankin (Stroke Patients Only)       Balance Overall balance assessment: Needs assistance Sitting-balance support: Feet supported, Bilateral upper extremity supported Sitting balance-Leahy Scale: Poor Sitting balance - Comments: maintaining static sitting balance with minG for safety but minA for dynamic sitting balance required during BLE exercises Postural control: Posterior lean Standing balance support: Bilateral upper extremity supported, During functional activity Standing balance-Leahy Scale: Poor Standing balance comment: requiring external support and physical assist to maintain balance. Standing from chair with [facility] pt has posterior lean, unable to correct today  Cognition Arousal/Alertness: Awake/alert Behavior During Therapy: Flat affect Overall Cognitive Status: Impaired/Different from baseline Area of Impairment: Memory, Following commands, Problem solving, Attention,  Safety/judgement, Awareness                   Current Attention Level: Selective Memory: Decreased short-term memory Following Commands: Follows one step commands consistently, Follows one step commands with increased time Safety/Judgement: Decreased awareness of safety, Decreased awareness of deficits Awareness: Intellectual Problem Solving: Slow processing, Difficulty sequencing, Decreased initiation General Comments: pt more alert during today's session, following commands with increased time        Exercises General Exercises - Lower Extremity Ankle Circles/Pumps: AROM, Both, 10 reps, Seated Long Arc Quad: AROM, Both, 10 reps, Seated Hip Flexion/Marching: AROM, Both, 10 reps, Seated    General Comments General comments (skin integrity, edema, etc.): HR elevated to 120s with mobility      Pertinent Vitals/Pain Pain Assessment Pain Assessment: Faces Faces Pain Scale: Hurts a little bit Pain Location: generalized Pain Descriptors / Indicators: Grimacing Pain Intervention(s): Monitored during session, Limited activity within patient's tolerance    Home Living                          Prior Function            PT Goals (current goals can now be found in the care plan section) Acute Rehab PT Goals Patient Stated Goal: family reports goal is to get more independent to return home PT Goal Formulation: With patient/family Time For Goal Achievement: 07/16/22 Potential to Achieve Goals: Fair Progress towards PT goals: Progressing toward goals    Frequency    Min 3X/week      PT Plan Current plan remains appropriate    Co-evaluation PT/OT/SLP Co-Evaluation/Treatment: Yes Reason for Co-Treatment: For patient/therapist safety;To address functional/ADL transfers PT goals addressed during session: Mobility/safety with mobility;Strengthening/ROM OT goals addressed during session: Strengthening/ROM      AM-PAC PT "6 Clicks" Mobility   Outcome  Measure  Help needed turning from your back to your side while in a flat bed without using bedrails?: A Lot Help needed moving from lying on your back to sitting on the side of a flat bed without using bedrails?: A Lot Help needed moving to and from a bed to a chair (including a wheelchair)?: Total Help needed standing up from a chair using your arms (e.g., wheelchair or bedside chair)?: Total Help needed to walk in hospital room?: Total Help needed climbing 3-5 steps with a railing? : Total 6 Click Score: 8    End of Session Equipment Utilized During Treatment: Gait belt Activity Tolerance: Patient tolerated treatment well Patient left: with family/visitor present;Other (comment);in chair;with call bell/phone within reach;with chair alarm set;with SCD's reapplied Nurse Communication: Mobility status;Need for lift equipment PT Visit Diagnosis: Difficulty in walking, not elsewhere classified (R26.2);Muscle weakness (generalized) (M62.81);Other abnormalities of gait and mobility (R26.89)     Time: [id number] PT Time Calculation (min) (ACUTE ONLY): 31 min  Charges:  $Therapeutic Activity: 8-22 mins                     Charlynne Cousins, PT DPT Acute Rehabilitation Services Office [phone]    Luvenia Heller 07/14/2022, 1:48 PM

## 2022-07-14 NOTE — Progress Notes (Signed)
PHARMACY - PHYSICIAN COMMUNICATION CRITICAL VALUE ALERT - BLOOD CULTURE IDENTIFICATION (BCID)  Barbara Nelson is an 82 y.o. female who presented to [facility] on 06/30/2022 with a chief complaint of encephalopathy  Name of physician (or Provider) Contacted: Dr. Nevada Crane  Current antibiotics: Ceftriaxone/Doxycycline  Changes to prescribed antibiotics recommended: Likely contaminant No changes for now  Results for orders placed or performed during the hospital encounter of 06/30/22  Blood Culture ID Panel (Reflexed) (Collected: 07/12/2022 10:54 AM)  Result Value Ref Range   Enterococcus faecalis NOT DETECTED NOT DETECTED   Enterococcus Faecium NOT DETECTED NOT DETECTED   Listeria monocytogenes NOT DETECTED NOT DETECTED   Staphylococcus species DETECTED (A) NOT DETECTED   Staphylococcus aureus (BCID) NOT DETECTED NOT DETECTED   Staphylococcus epidermidis NOT DETECTED NOT DETECTED   Staphylococcus lugdunensis NOT DETECTED NOT DETECTED   Streptococcus species NOT DETECTED NOT DETECTED   Streptococcus agalactiae NOT DETECTED NOT DETECTED   Streptococcus pneumoniae NOT DETECTED NOT DETECTED   Streptococcus pyogenes NOT DETECTED NOT DETECTED   A.calcoaceticus-baumannii NOT DETECTED NOT DETECTED   Bacteroides fragilis NOT DETECTED NOT DETECTED   Enterobacterales NOT DETECTED NOT DETECTED   Enterobacter cloacae complex NOT DETECTED NOT DETECTED   Escherichia coli NOT DETECTED NOT DETECTED   Klebsiella aerogenes NOT DETECTED NOT DETECTED   Klebsiella oxytoca NOT DETECTED NOT DETECTED   Klebsiella pneumoniae NOT DETECTED NOT DETECTED   Proteus species NOT DETECTED NOT DETECTED   Salmonella species NOT DETECTED NOT DETECTED   Serratia marcescens NOT DETECTED NOT DETECTED   Haemophilus influenzae NOT DETECTED NOT DETECTED   Neisseria meningitidis NOT DETECTED NOT DETECTED   Pseudomonas aeruginosa NOT DETECTED NOT DETECTED   Stenotrophomonas maltophilia NOT DETECTED NOT DETECTED   Candida  albicans NOT DETECTED NOT DETECTED   Candida auris NOT DETECTED NOT DETECTED   Candida glabrata NOT DETECTED NOT DETECTED   Candida krusei NOT DETECTED NOT DETECTED   Candida parapsilosis NOT DETECTED NOT DETECTED   Candida tropicalis NOT DETECTED NOT DETECTED   Cryptococcus neoformans/gattii NOT DETECTED NOT DETECTED    [name] 07/14/2022  4:05 AM

## 2022-07-14 NOTE — Progress Notes (Signed)
Mobility Specialist Progress Note   07/14/22 1647  Mobility  Activity Turned to back - supine  Level of Assistance Moderate assist, patient does 50-74%  Assistive Device Other (Comment) (HHA)  Range of Motion/Exercises All extremities  Activity Response Tolerated fair  Mobility Referral Yes  $Mobility charge 1 Mobility   Received pt in bed deferring mobility but w/ max encouragement pt agreeable to bed level exercises + PROM. Pt able to tolerate PROM w/ modA and no complaints. Pt showed decreased strength in both UE's and LE's when performing exercises and unable to finish out session d/t fatigue. Left w/ call bell in reach and bed alarm on.    [place] Mobility Specialist Please contact via SecureChat or  Rehab office at [phone]

## 2022-07-14 NOTE — Progress Notes (Signed)
Occupational Therapy Treatment Patient Details Name: ELLAH Nelson MRN: [id number] DOB: [birth year] Today's Date: 07/14/2022   History of present illness Pt is an 82 y/o female admitted for AMS in setting of HCAP PNA. Also noted dehydration and fecal impaction. Recent admission 2/1-2/10 for same. PMH: DM2, HTN, CHF.   OT comments  Patient received in supine, alert and willing to participate in OT/PT session. Patient demonstrated gains with following directions and getting to EOB with assistance for BLEs and trunk. Patient able to maintain static sitting balance but demonstrates posterior leaning during dynamic activities. Patient able to stand in stedy with mod assist x2 for transfer to recliner and stood from recliner with mod assist of 2 with HHA. Patient is making gains towards goals and would benefit from continued OT treatment to address self care and functional transfers.    Recommendations for follow up therapy are one component of a multi-disciplinary discharge planning process, led by the attending physician.  Recommendations may be updated based on patient status, additional functional criteria and insurance authorization.    Follow Up Recommendations  Skilled nursing-short term rehab (<3 hours/day)     Assistance Recommended at Discharge Frequent or constant Supervision/Assistance  Patient can return home with the following  Two people to help with walking and/or transfers;Two people to help with bathing/dressing/bathroom   Equipment Recommendations  Wheelchair (measurements OT);Wheelchair cushion (measurements OT)    Recommendations for Other Services      Precautions / Restrictions Precautions Precautions: Fall;Other (comment) Precaution Comments: monitor O2, HR Restrictions Weight Bearing Restrictions: No       Mobility Bed Mobility Overal bed mobility: Needs Assistance Bed Mobility: Supine to Sit     Supine to sit: Mod assist, +2 for physical assistance      General bed mobility comments: difficulty moving BLEs to EOB and assistance with trunk and scooting hips forward    Transfers Overall transfer level: Needs assistance Equipment used: 2 person hand held assist, Ambulation equipment used Transfers: Sit to/from Stand, Bed to chair/wheelchair/BSC Sit to Stand: Mod assist, +2 physical assistance           General transfer comment: Stood from EOB with stedy and mod assist x2. Min assist to stand from [place] pads.  Stood once from EOB with 2 person HHA and mod assist of 2 to power up Transfer via Lift Equipment: [employer] Overall balance assessment: Needs assistance Sitting-balance support: Feet supported, Single extremity supported, Bilateral upper extremity supported Sitting balance-Leahy Scale: Poor Sitting balance - Comments: able to maintain static sitting balance, required assistance for balance for dynamic balance activities, LE exercises Postural control: Posterior lean Standing balance support: Bilateral upper extremity supported, During functional activity Standing balance-Leahy Scale: Poor Standing balance comment: able to stand into stedy with +2 assist and able to stand from recliner with +2 HHA                           ADL either performed or assessed with clinical judgement   ADL Overall ADL's : Needs assistance/impaired                                       General ADL Comments: focused on EOB sitting balance, standing/transfers with Stedy and sit to stands with HHA    Extremity/Trunk Assessment  Vision       Perception     Praxis      Cognition Arousal/Alertness: Awake/alert Behavior During Therapy: Flat affect Overall Cognitive Status: Impaired/Different from baseline Area of Impairment: Memory, Following commands, Problem solving, Attention, Safety/judgement, Awareness                   Current Attention Level: Selective Memory: Decreased  short-term memory Following Commands: Follows one step commands consistently, Follows one step commands with increased time Safety/Judgement: Decreased awareness of safety, Decreased awareness of deficits Awareness: Intellectual Problem Solving: Slow processing, Difficulty sequencing General Comments: alert and eager to participate, follow directions with increased time        Exercises Exercises: General Upper Extremity General Exercises - Upper Extremity Shoulder Flexion: AROM, Both, 10 reps, Seated    Shoulder Instructions       General Comments      Pertinent Vitals/ Pain       Pain Assessment Pain Assessment: Faces Faces Pain Scale: Hurts a little bit Pain Location: RLE Pain Descriptors / Indicators: Grimacing Pain Intervention(s): Monitored during session, Repositioned  Home Living                                          Prior Functioning/Environment              Frequency  Min 2X/week        Progress Toward Goals  OT Goals(current goals can now be found in the care plan section)  Progress towards OT goals: Progressing toward goals  Acute Rehab OT Goals Patient Stated Goal: get better OT Goal Formulation: With patient/family Time For Goal Achievement: 07/16/22 Potential to Achieve Goals: Fair ADL Goals Pt Will Perform Upper Body Bathing: with modified independence;sitting Pt Will Perform Lower Body Bathing: with mod assist;sit to/from stand Pt Will Perform Lower Body Dressing: with set-up;with adaptive equipment;sit to/from stand;with supervision Pt Will Transfer to Toilet: with mod assist;stand pivot transfer;bedside commode Additional ADL Goal #1: Pt to maintain sitting balance > 5 min EOB during functional tasks with no more than min guard  Plan Discharge plan remains appropriate    Co-evaluation    PT/OT/SLP Co-Evaluation/Treatment: Yes Reason for Co-Treatment: For patient/therapist safety;To address functional/ADL  transfers   OT goals addressed during session: Strengthening/ROM      AM-PAC OT "6 Clicks" Daily Activity     Outcome Measure   Help from another person eating meals?: A Little Help from another person taking care of personal grooming?: A Lot Help from another person toileting, which includes using toliet, bedpan, or urinal?: A Lot Help from another person bathing (including washing, rinsing, drying)?: A Lot Help from another person to put on and taking off regular upper body clothing?: A Lot Help from another person to put on and taking off regular lower body clothing?: Total 6 Click Score: 12    End of Session Equipment Utilized During Treatment: Gait belt;Other (comment) Charlaine Dalton)  OT Visit Diagnosis: Unsteadiness on feet (R26.81);Muscle weakness (generalized) (M62.81);Other abnormalities of gait and mobility (R26.89);Other symptoms and signs involving cognitive function   Activity Tolerance Patient tolerated treatment well   Patient Left in chair;with call bell/phone within reach;with chair alarm set;with family/visitor present   Nurse Communication Mobility status        Time: [id number] OT Time Calculation (min): 31 min  Charges: OT General Charges $OT Visit: 1 Visit OT  Treatments $Therapeutic Activity: 8-22 mins  Lodema Hong, OTA Acute Rehabilitation Services  Office [phone]   Trixie Dredge 07/14/2022, 10:58 AM

## 2022-07-14 NOTE — Progress Notes (Signed)
PROGRESS NOTE  Barbara Nelson [id number] DOB: 08-Dec-1940   PCP: Cari Caraway, MD  Patient is from: SNF  DOA: 06/30/2022 LOS: 28  Chief complaints Chief Complaint  Patient presents with   Fatigue   Abdominal Pain   Emesis     Brief Narrative / Interim history:  82 year old F with PMH of diastolic CHF, chronic hypoxic RF on 2 L, HTN, and recent hospitalization from 2/1-10 with CAP and acute on chronic CHF returning with altered mental status, lethargy and vomiting, and admitted with working diagnosis of HCAP and encephalopathy.  Family denies history of dementia or cognitive impairment.  They significant decline in cognition since she was diagnosed with shingles in January and started on gabapentin.  She was weaned off gabapentin but she continued to decline, even worse after her recent hospitalization.  She is currently not on any sedating medication.  In ED, slightly hypertensive.  Not febrile.  Saturating in upper 90s to 100 on 2 L.  No leukocytosis.  Hgb 15.2 (12.4 about 12 days ago).  Slightly elevated LFT and Cr.  Lipase, BNP, ammonia, TSH, CK and procalcitonin within normal.  Lactic acid 2.1.  VBG suggess mild metabolic alkalosis.  UDS negative.  UA with > 300 protein.  XR with minimal strandy and hazy opacity at both lung bases suggesting atelectasis or infection, small left pleural effusion and stable cardiomegaly.  CT abdomen and pelvis with contrast showed large amount of stool in the rectum concerning for impaction.  She had disimpaction in ED.  CT head without acute finding.  MRI brain without acute finding but limited by motion.  Patient was started on IVF, IV cefepime and vancomycin and admitted with working diagnosis of HCAP, encephalopathy and dehydration.  The next day, pneumonia or infection felt to be less likely.  Blood cultures negative.  Antibiotic discontinued.  Further encephalopathy workup including ABG, UDS, B1 and B12 unrevealing.  Neurology consulted and  she had lumbar puncture.  CSF studies unrevealing so far.  EEG suggest moderate diffuse encephalopathy but no seizure or epileptiform discharge.  Started on high-dose thiamine without significant improvement.  Family requested transfer to [place] but both at full capacity.  Fortunately, encephalopathy improved.  GI following for constipation.  As well nausea and vomiting.  He did develop some leukocytosis, for which x-ray has been obtained and showing evidence of pneumonia, she was started on antibiotics, her left IV site has been infiltrated but significantly improved, she did develop urinary retention where she required Foley catheter insertion 07/13/2022.   Subjective:  She had a good BM yesterday, no nausea, no vomiting, she had urinary retention overnight for which she required Foley catheter insertion.    Objective: Vitals:   07/14/22 0400 07/14/22 0500 07/14/22 0800 07/14/22 1115  BP: 94/66  109/68 126/87  Pulse: 82  91 95  Resp: '20  17 17  '$ Temp:  (!) 97.4 F (36.3 C)  97.8 F (36.6 C)  TempSrc:  Oral  Axillary  SpO2: 94%  95% 97%  Weight:  96.3 kg      Examination:  Awake Alert, frail, deconditioned, she is more awake and conversant today Symmetrical Chest wall movement, improved air entry at the bases RRR,No Gallops,Rubs or new Murmurs, No Parasternal Heave +ve B.Sounds, Abd Soft, No tenderness, No rebound - guarding or rigidity. No Cyanosis, Clubbing or edema, left upper extremity IV infiltration site much improved    Procedures:  2/15-lumbar puncture.  Microbiology summarized: [id number], influenza and RSV PCR nonreactive  MRSA PCR screen nonreactive Blood cultures NGTD CSF cultures NGTD.  Assessment and plan: Principal Problem:   Acute metabolic encephalopathy Active Problems:   Acute hypoxic respiratory failure (HCC)   Essential hypertension   Hypercalcemia   Dehydration   Acute prerenal azotemia   Transaminitis   Constipation   DM2 (diabetes  mellitus, type 2) (HCC)   Allergic rhinitis   Chronic diastolic CHF (congestive heart failure) (HCC)   Nausea and vomiting   Pressure injury of skin  Acute metabolic encephalopathy:  - Initially felt to be dehydration and delirium but mental status worse despite hydration.  - Encephalopathy workup including CT head, MRI brain, ammonia, TSH, VBG, UDS, B1, B12, RPR, CSF study and EEG unrevealing.   - Now awake and alert and oriented to self and family. -Reorientation and delirium precaution -Completed high-dose thiamine. -SLP advanced diet to dysphagia 2 with thin liquid -Fall and aspiration precaution. -[facility] called for transfer but at capacity. -Neurology available as needed -Mentation continues to improve gradually  Vomiting/constipation/possible fecal impaction:  -  CT without acute finding other than fecal impaction.  RUQ [place] negative. S/p disimpaction in ED. -Appreciate input by GI-enema and bowel regimen -Constipation/impaction has resolved, she is with good BM -Barium swallow was ordered ,patient was not cooperative, it was unable to be performed -No vomiting over the last 4 days -Continue with Diflucan for oral thrush, to finish 3 days, continue with Magic mouthwash.    Chronic diastolic CHF - TTE on 2/2 with LVEF of 50 to 55% and G1-DD.  She was somewhat dehydrated on presentation.  She seems to be on Lasix.  No CHF on chest x-ray. -No oral intake is more reliable will DC IV fluids -Continue to hold Lasix.  Resume once her oral intake has improved -Continue with low-dose metoprolol as blood pressure allows for known sinus tachycardia -Blood pressure has improved, she did not receive any midodrine for last couple days, will discontinue it. -Cardiology were consulted upon family request  Chronic hypoxic respiratory failure:  -On 2 L at baseline.  Currently on room air. -Minimum oxygen to keep saturation above 90%. -Try incentive spirometry and pulmonary  toilet. -Started on midodrine for soft blood pressure, continue with low-dose Toprol-XL for sinus tachycardia  HCAP/pneumonia ruled out:  - No fever, leukocytosis.  Procalcitonin within normal.  COVID-19, influenza and RSV PCR nonreactive.  MRSA PCR negative.  Full RVP negative.  Blood cultures NGTD.  Cough likely due to silent aspiration in the setting of encephalopathy and vomiting. -Appreciate input by pulmonology. -Continue aspiration precaution and pulmonary toilet. -With worsening leukocytosis, chest x-ray was obtained which did show worsening lower extremity atelectasis versus pneumonia, as well has new opacity infiltrate, on Rocephin and doxycycline -1/4 of blood cultures significant for gram-positive cocci, likely contaminant, will follow final results.   Urinary retention -Required Foley catheter insertion 2/26  Left upper extremity cellulitis -Patient with left upper extremity erythema at IV site, appears has infiltrated, continue with warm compress, topical antibiotics and IV antibiotics.  Much improved  Dehydration: Likely due to poor intake and vomiting.  Seems to have resolved with IV hydration. -Decrease IV fluid.  Controlled IDDM-2 with hyperglycemia: A1c 6.9%. -Continue current insulin regimen  Elevated LFT:  CK within normal.  LFT improved.  RUQ [place] negative.  Stable. -Continue monitoring  Hypokalemia/hypophosphatemia -Potassium significantly low at 3 this morning, he is having difficulty with taking oral potassium, will try KCl packets today, and IV supplements.  Hypercalcemia: Likely secondary due  to dehydration.  Resolved with hydration.  Thrombocytopenia:  -Platelet count is improving, will consider back on Lovenox once> 100 K -Discontinued Lovenox -HIT is negative -SCD for VTE prophylaxis  Generalized weakness/lethargy: In the setting of encephalopathy as above. -PT/OT eval as above   Obesity/decreased oral intake: Decreased oral intake likely due to  encephalopathy.  Prealbumin 11.  Resistant NG tube insertion on 2/16.  Now encephalopathy resolved.  Diet advanced to SLP. Body mass index is 35.33 kg/m. Nutrition Problem: Inadequate oral intake Etiology: lethargy/confusion, dysphagia Signs/Symptoms: NPO status (pocketing/holding food) Interventions: Boost Breeze, MVI   DVT prophylaxis:  Place and maintain sequential compression device Start: 07/09/22 0658 Place and maintain sequential compression device Start: 07/05/22 0743 SCDs Start: 06/30/22 2019  Code Status: Full code Family Communication: D/W daughter at bedside Level of care: Med-Surg Status is: Inpatient Remains inpatient appropriate because: Acute metabolic encephalopathy, dehydration and lethargy.   Final disposition: SNF. Consultants:  Neurology Pulmonology Gastroenterology   Sch Meds:  Scheduled Meds:  bisacodyl  10 mg Rectal Daily   Chlorhexidine Gluconate Cloth  6 each Topical Daily   doxycycline  100 mg Oral Q12H   feeding supplement  1 Container Oral BID BM   fluconazole  100 mg Oral Daily   fluticasone  2 spray Each Nare q morning   insulin aspart  0-6 Units Subcutaneous TID WC   lidocaine   Topical TID   magic mouthwash  5 mL Oral QID   magnesium citrate  1 Bottle Oral Once   metoprolol succinate  12.5 mg Oral QHS   multivitamin with minerals  1 tablet Oral Daily   polyethylene glycol  17 g Oral BID   potassium chloride  40 mEq Oral Once   thiamine (VITAMIN B1) injection  100 mg Intravenous Daily   Continuous Infusions:  cefTRIAXone (ROCEPHIN)  IV Stopped (07/13/22 2242)   PRN Meds:.acetaminophen **OR** acetaminophen, bisacodyl, gadobutrol, guaiFENesin, ipratropium-albuterol, menthol-cetylpyridinium, milk and molasses, ondansetron (ZOFRAN) IV  Antimicrobials: Anti-infectives (From admission, onward)    Start     Dose/Rate Route Frequency Ordered Stop   07/13/22 1115  fluconazole (DIFLUCAN) tablet 100 mg        100 mg Oral Daily 07/13/22 1018  07/16/22 0959   07/12/22 1415  doxycycline (VIBRA-TABS) tablet 100 mg        100 mg Oral Every 12 hours 07/12/22 1319     07/12/22 1400  ceFAZolin (ANCEF) IVPB 1 g/50 mL premix  Status:  Discontinued        1 g 100 mL/hr over 30 Minutes Intravenous Every 8 hours 07/12/22 1054 07/12/22 1319   07/12/22 1400  cefTRIAXone (ROCEPHIN) 2 g in sodium chloride 0.9 % 100 mL IVPB        2 g 200 mL/hr over 30 Minutes Intravenous Every 24 hours 07/12/22 1319     07/10/22 1400  cephALEXin (KEFLEX) 250 MG/5ML suspension 500 mg  Status:  Discontinued        500 mg Oral Every 8 hours 07/10/22 1049 07/12/22 1054   07/04/22 1730  fluconazole (DIFLUCAN) tablet 150 mg  Status:  Discontinued        150 mg Oral  Once 07/04/22 1633 07/08/22 1024   07/01/22 2200  vancomycin (VANCOCIN) IVPB 1000 mg/200 mL premix  Status:  Discontinued        1,000 mg 200 mL/hr over 60 Minutes Intravenous Every 24 hours 06/30/22 2136 07/01/22 1047   07/01/22 0800  ceFEPIme (MAXIPIME) 2 g in sodium chloride 0.9 %  100 mL IVPB  Status:  Discontinued        2 g 200 mL/hr over 30 Minutes Intravenous Every 8 hours 07/01/22 0735 07/01/22 1047   06/30/22 2145  ceFEPIme (MAXIPIME) 2 g in sodium chloride 0.9 % 100 mL IVPB  Status:  Discontinued        2 g 200 mL/hr over 30 Minutes Intravenous Every 12 hours 06/30/22 2136 07/01/22 0735   06/30/22 2145  vancomycin (VANCOREADY) IVPB 2000 mg/400 mL        2,000 mg 200 mL/hr over 120 Minutes Intravenous  Once 06/30/22 2136 07/01/22 0204        I have personally reviewed the following labs and images: CBC: Recent Labs  Lab 07/10/22 0611 07/11/22 0246 07/12/22 0303 07/13/22 0410 07/14/22 0333  WBC 11.0* 12.4* 13.8* 10.3 8.9  HGB 13.0 12.2 11.1* 11.3* 11.2*  HCT 40.3 37.4 32.8* 34.5* 33.6*  MCV 85.4 84.2 82.8 84.4 82.8  PLT 65* 72* 70* 80* 90*   BMP &GFR Recent Labs  Lab 07/08/22 0431 07/09/22 0449 07/10/22 0556 07/10/22 0611 07/11/22 0246 07/12/22 0303 07/13/22 0410  07/14/22 0333  NA 136   < >  --  137 136 134* 133* 130*  K 3.7   < >  --  3.5 3.3* 3.1* 3.6 3.0*  CL 106   < >  --  106 107 105 103 102  CO2 25   < >  --  '22 24 22 '$ 19* 20*  GLUCOSE 142*   < >  --  102* 125* 116* 110* 83  BUN 7*   < >  --  6* 8 6* 6* 7*  CREATININE 0.74   < >  --  0.91 0.86 0.88 0.79 0.83  CALCIUM 8.1*   < >  --  8.4* 8.1* 7.9* 8.1* 8.0*  MG 1.9  --   --   --   --   --   --   --   PHOS 2.1*  --  2.5  --   --   --   --   --    < > = values in this interval not displayed.   Estimated Creatinine Clearance: 60 mL/min (by C-G formula based on SCr of 0.83 mg/dL). Liver & Pancreas: Recent Labs  Lab 07/08/22 0431  AST 57*  ALT 45*  ALKPHOS 71  BILITOT 0.8  PROT 5.1*  ALBUMIN 1.5*   No results for input(s): "LIPASE", "AMYLASE" in the last 168 hours.  No results for input(s): "AMMONIA" in the last 168 hours.  Diabetic: No results for input(s): "HGBA1C" in the last 72 hours. Recent Labs  Lab 07/13/22 1252 07/13/22 1736 07/13/22 2035 07/14/22 0830 07/14/22 1118  GLUCAP 105* 109* 105* 79 136*   Cardiac Enzymes: No results for input(s): "CKTOTAL", "CKMB", "CKMBINDEX", "TROPONINI" in the last 168 hours.  No results for input(s): "PROBNP" in the last 8760 hours. Coagulation Profile: No results for input(s): "INR", "PROTIME" in the last 168 hours.  Thyroid Function Tests: No results for input(s): "TSH", "T4TOTAL", "FREET4", "T3FREE", "THYROIDAB" in the last 72 hours.  Lipid Profile: No results for input(s): "CHOL", "HDL", "LDLCALC", "TRIG", "CHOLHDL", "LDLDIRECT" in the last 72 hours. Anemia Panel: No results for input(s): "VITAMINB12", "FOLATE", "FERRITIN", "TIBC", "IRON", "RETICCTPCT" in the last 72 hours.  Urine analysis:    Component Value Date/Time   COLORURINE AMBER (A) 07/12/2022 0956   APPEARANCEUR HAZY (A) 07/12/2022 0956   LABSPEC 1.021 07/12/2022 0956   PHURINE 5.0 07/12/2022  [facility] 07/12/2022 0956   HGBUR NEGATIVE 07/12/2022  0956   BILIRUBINUR NEGATIVE 07/12/2022 0956   KETONESUR NEGATIVE 07/12/2022 0956   PROTEINUR 100 (A) 07/12/2022 0956   UROBILINOGEN 0.2 09/15/2011 0530   NITRITE NEGATIVE 07/12/2022 0956   LEUKOCYTESUR NEGATIVE 07/12/2022 0956   Sepsis Labs: Invalid input(s): "PROCALCITONIN", "LACTICIDVEN"  Microbiology: Recent Results (from the past 240 hour(s))  Culture, blood (Routine X 2) w Reflex to ID Panel     Status: None (Preliminary result)   Collection Time: 07/12/22 10:45 AM   Specimen: BLOOD RIGHT HAND  Result Value Ref Range Status   Specimen Description BLOOD RIGHT HAND  Final   Special Requests   Final    BOTTLES DRAWN AEROBIC AND ANAEROBIC Blood Culture results may not be optimal due to an inadequate volume of blood received in culture bottles   Culture   Final    NO GROWTH 2 DAYS Performed at [facility], [place] [address] [place], [place] [address]    Report Status PENDING  Incomplete  Culture, blood (Routine X 2) w Reflex to ID Panel     Status: None (Preliminary result)   Collection Time: 07/12/22 10:54 AM   Specimen: BLOOD LEFT HAND  Result Value Ref Range Status   Specimen Description BLOOD LEFT HAND  Final   Special Requests   Final    BOTTLES DRAWN AEROBIC ONLY Blood Culture results may not be optimal due to an inadequate volume of blood received in culture bottles   Culture  Setup Time   Final    AEROBIC BOTTLE ONLY GRAM POSITIVE COCCI IN CLUSTERS CRITICAL RESULT CALLED TO, READ BACK BY AND VERIFIED WITH: PHARMD J. LEDFORD 07/13/22 @ 2209 BY AB Performed at [facility], [place] [address], [place], [place] [address]    Culture GRAM POSITIVE COCCI  Final   Report Status PENDING  Incomplete  Blood Culture ID Panel (Reflexed)     Status: Abnormal   Collection Time: 07/12/22 10:54 AM  Result Value Ref Range Status   Enterococcus faecalis NOT DETECTED NOT DETECTED Final   Enterococcus Faecium NOT DETECTED NOT DETECTED Final   Listeria monocytogenes NOT  DETECTED NOT DETECTED Final   Staphylococcus species DETECTED (A) NOT DETECTED Final    Comment: CRITICAL RESULT CALLED TO, READ BACK BY AND VERIFIED WITH: PHARMD J. LEDFORD 07/13/22 @ 2209 BY AB    Staphylococcus aureus (BCID) NOT DETECTED NOT DETECTED Final   Staphylococcus epidermidis NOT DETECTED NOT DETECTED Final   Staphylococcus lugdunensis NOT DETECTED NOT DETECTED Final   Streptococcus species NOT DETECTED NOT DETECTED Final   Streptococcus agalactiae NOT DETECTED NOT DETECTED Final   Streptococcus pneumoniae NOT DETECTED NOT DETECTED Final   Streptococcus pyogenes NOT DETECTED NOT DETECTED Final   A.calcoaceticus-baumannii NOT DETECTED NOT DETECTED Final   Bacteroides fragilis NOT DETECTED NOT DETECTED Final   Enterobacterales NOT DETECTED NOT DETECTED Final   Enterobacter cloacae complex NOT DETECTED NOT DETECTED Final   Escherichia coli NOT DETECTED NOT DETECTED Final   Klebsiella aerogenes NOT DETECTED NOT DETECTED Final   Klebsiella oxytoca NOT DETECTED NOT DETECTED Final   Klebsiella pneumoniae NOT DETECTED NOT DETECTED Final   Proteus species NOT DETECTED NOT DETECTED Final   Salmonella species NOT DETECTED NOT DETECTED Final   Serratia marcescens NOT DETECTED NOT DETECTED Final   Haemophilus influenzae NOT DETECTED NOT DETECTED Final   Neisseria meningitidis NOT DETECTED NOT DETECTED Final   Pseudomonas aeruginosa NOT DETECTED NOT  DETECTED Final   Stenotrophomonas maltophilia NOT DETECTED NOT DETECTED Final   Candida albicans NOT DETECTED NOT DETECTED Final   Candida auris NOT DETECTED NOT DETECTED Final   Candida glabrata NOT DETECTED NOT DETECTED Final   Candida krusei NOT DETECTED NOT DETECTED Final   Candida parapsilosis NOT DETECTED NOT DETECTED Final   Candida tropicalis NOT DETECTED NOT DETECTED Final   Cryptococcus neoformans/gattii NOT DETECTED NOT DETECTED Final    Comment: Performed at [facility], [place] [address], [place], [place] [address]     Radiology Studies: No results found.    Phillips Climes MD [facility]  If 7PM-7AM, please contact night-coverage [email or web address] 07/14/2022, 12:47 PM

## 2022-07-14 NOTE — Progress Notes (Signed)
Speech Language Pathology Treatment: Dysphagia  Patient Details Name: Barbara Nelson MRN: [id number] DOB: [birth year] Today's Date: 07/14/2022 Time: [id number] SLP Time Calculation (min) (ACUTE ONLY): 15 min  Assessment / Plan / Recommendation Clinical Impression  Patient seen by SLP for skilled treatment focused on dysphagia goals. One of her daughter's was present in the room and patient sitting up in recliner with lunch meal in front of her (Dys 2 solids; minced salmon). Patient was able to feed herself without assistance but did require some occasional verbal cues from daughter to redirect attention. Patient exhibited mildly prolonged mastication but with full oral clearance and no overt s/s aspiration. SLP recommending patient continue on current diet. Daughter asked if there were any other suggestions/recommendations and SLP advised to continue encouraging patient to self-feed, consider ordering puree foods like mashed potatoes to have along with Dys 2 solids. SLP will continue to follow for toleration and ability to advance solids.   HPI HPI: Pt is a 82 y.o. female who presented with AMS and was admitted with acute metabolic encephalopathy in the setting of HCAP pneumonia. CXR 2/13: Minimal strandy and hazy opacities in both lung bases may  represent atelectasis or infection. PMH: type 2 diabetes mellitus, essential hypertension, chronic diastolic heart failure, chronic hypoxic respiratory failure. BSE 06/20/22 regular texture diet with thin liquids recommended.      SLP Plan  Continue with current plan of care      Recommendations for follow up therapy are one component of a multi-disciplinary discharge planning process, led by the attending physician.  Recommendations may be updated based on patient status, additional functional criteria and insurance authorization.    Recommendations  Diet recommendations: Dysphagia 2 (fine chop);Thin liquid Liquids provided via:  Straw;Cup Medication Administration: Whole meds with puree Supervision: Staff to assist with self feeding;Full supervision/cueing for compensatory strategies;Patient able to self feed Compensations: Slow rate;Small sips/bites;Minimize environmental distractions Postural Changes and/or Swallow Maneuvers: Seated upright 90 degrees                Oral Care Recommendations: Oral care BID Follow Up Recommendations: Skilled nursing-short term rehab (<3 hours/day) Assistance recommended at discharge: Intermittent Supervision/Assistance SLP Visit Diagnosis: Dysphagia, unspecified (R13.10) Plan: Continue with current plan of care        Sonia Baller, MA, CCC-SLP Speech Therapy

## 2022-07-14 NOTE — Care Management (Signed)
    Durable Medical Equipment  (From admission, onward)           Start     Ordered   07/14/22 1621  For home use only DME Hospital bed  Once       Question Answer Comment  Length of Need Lifetime   Patient has (list medical condition): debility   The above medical condition requires: Patient requires the ability to reposition frequently   Head must be elevated greater than: 30 degrees   Bed type Semi-electric   Hoyer Lift Yes   Support Surface: Gel Overlay      07/14/22 1620   07/14/22 1620  For home use only DME Bedside commode  Once       Question:  Patient needs a bedside commode to treat with the following condition  Answer:  Weakness   07/14/22 1620

## 2022-07-15 DIAGNOSIS — J189 Pneumonia, unspecified organism: Secondary | ICD-10-CM | POA: Diagnosis not present

## 2022-07-15 DIAGNOSIS — J9601 Acute respiratory failure with hypoxia: Secondary | ICD-10-CM | POA: Diagnosis not present

## 2022-07-15 DIAGNOSIS — G9341 Metabolic encephalopathy: Secondary | ICD-10-CM | POA: Diagnosis not present

## 2022-07-15 DIAGNOSIS — I5032 Chronic diastolic (congestive) heart failure: Secondary | ICD-10-CM | POA: Diagnosis not present

## 2022-07-15 LAB — BASIC METABOLIC PANEL
Anion gap: 6 (ref 5–15)
BUN: 6 mg/dL — ABNORMAL LOW (ref 8–23)
CO2: 19 mmol/L — ABNORMAL LOW (ref 22–32)
Calcium: 8.1 mg/dL — ABNORMAL LOW (ref 8.9–10.3)
Chloride: 106 mmol/L (ref 98–111)
Creatinine, Ser: 0.78 mg/dL (ref 0.44–1.00)
GFR, Estimated: >60 mL/min (ref >60)
Glucose, Bld: 100 mg/dL — ABNORMAL HIGH (ref 70–99)
Potassium: 3.9 mmol/L (ref 3.5–5.1)
Sodium: 131 mmol/L — ABNORMAL LOW (ref 135–145)

## 2022-07-15 LAB — CBC
HCT: 32.4 % — ABNORMAL LOW (ref 36.0–46.0)
Hemoglobin: 10.9 g/dL — ABNORMAL LOW (ref 12.0–15.0)
MCH: 28 pg (ref 26.0–34.0)
MCHC: 33.6 g/dL (ref 30.0–36.0)
MCV: 83.3 fL (ref 80.0–100.0)
Platelets: 101 10*3/uL — ABNORMAL LOW (ref 150–400)
RBC: 3.89 MIL/uL (ref 3.87–5.11)
RDW: 16 % — ABNORMAL HIGH (ref 11.5–15.5)
WBC: 8.5 10*3/uL (ref 4.0–10.5)
nRBC: 0.2 % (ref 0.0–0.2)

## 2022-07-15 LAB — GLUCOSE, CAPILLARY
Glucose-Capillary: 95 mg/dL (ref 70–99)
Glucose-Capillary: 111 mg/dL — ABNORMAL HIGH (ref 70–99)
Glucose-Capillary: 101 mg/dL — ABNORMAL HIGH (ref 70–99)
Glucose-Capillary: 104 mg/dL — ABNORMAL HIGH (ref 70–99)

## 2022-07-15 NOTE — Hospital Course (Signed)
Barbara Nelson is a 82 y.o. female with a history of chronic diastolic heart failure, chronic respiratory failure on 2 L of oxygen, hypertension.  Patient presented secondary to mental status change, lethargy, vomiting.  Patient initially started on treatment for HCAP.  Thorough workup for altered mental status was unrevealing for etiology, however mental status eventually improved over time.  During hospitalization, patient was managed for fecal impaction with improved bowel regimen.  Electrolytes managed as needed.  Patient also treated for developed cellulitis of her left upper extremity. Foley catheter placed for acute urinary retention.

## 2022-07-15 NOTE — Progress Notes (Signed)
PROGRESS NOTE    Barbara Nelson  [id number] DOB: Jun 13, 1940 DOA: 06/30/2022 PCP: Cari Caraway, MD   Brief Narrative: Barbara Nelson is a 82 y.o. female with a history of chronic diastolic heart failure, chronic respiratory failure on 2 L of oxygen, hypertension.  Patient presented secondary to mental status change, lethargy, vomiting.  Patient initially started on treatment for HCAP.  Thorough workup for altered mental status was unrevealing for etiology, however mental status eventually improved over time.  During hospitalization, patient was managed for fecal impaction with improved bowel regimen.  Electrolytes managed as needed.  And patient also treated for developed cellulitis of her left upper extremity.   Assessment and Plan:  Acute metabolic encephalopathy Presumed initially secondary to dehydration and delirium, however mental status worsened despite hydration.  Workup significant for non-revealing CT head, MRI brain, ammonia, TSH, VBG, UDS, vitamin B1/B12, RPR, CSF, EEG.  Patient was started on high dose thiamine for which she completed.  Patient was also managed with antibiotics for presumed pneumonia in addition to left upper extremity cellulitis.  Patient's mental status eventually improved.  Constipation Fecal impaction Patient underwent disimpaction in the emergency department.  Gastroenterologist was consulted and recommended enema and bowel regimen.  Patient eventually with frequent bowel movements and resolution of impaction.  Emesis Unclear etiology.  GI was consulted without recommendation for endoscopic evaluation.  Barium swallow was ordered but unable to be completed secondary to patient noncooperation.  Emesis resolved.  Oral thrush Patient was managed on Magic mouthwash and Diflucan for total of 3 days only.  Acute urinary retention Unclear etiology.  Foley catheter was placed on 2/26. -Remove Foley and attempt voiding trial -If fails voiding trial  we will have to reinsert Foley and recommend outpatient urology follow-up  Chronic diastolic heart failure Stable.  Lasix held secondary to poor oral intake of fluids.  Cardiology consulted without inpatient recommendations.  Possible aspiration pneumonia Patient was treated empirically with antibiotics.  Repeat chest x-ray significant for worsened atelectasis versus pneumonia.  Patient without clinical features consistent with pneumonia at this time.  Patient is on room air.  Mild leukocytosis present but resolved.  Patient is currently on antibiotics for cellulitis. -Incentive spirometer  Left upper extremity cellulitis Cellulitis around IV site.  Appears possible IV infiltration.  Patient started empirically on ceftriaxone and doxycycline with improvement of cellulitis. -Continue antibiotics for a total of 5 days; transition to oral antibiotics for discharge  Diabetes mellitus type 2 Controlled for age.  Hemoglobin A1c is 6.9%. -Continue sliding scale insulin  Elevated AST/ALT Right upper quadrant ultrasound unremarkable.  Improved.  Recommend outpatient CMP.  Hypokalemia Mild.  Patient treated with supplemental potassium.  Resolved.  Hypophosphatemia Mild.  Patient treated with phosphorus supplementation.  Resolved.  Thrombocytopenia Unclear etiology but likely related to acute illness.  Recommend outpatient CBC in 1 week.  Generalized weakness Patient evaluated by PT and OT who recommended SNF placement on discharge.  Family has declined SNF placement and will take patient home with home health.  DVT prophylaxis: SCDs Code Status:   Code Status: Full Code Family Communication: Daughter at bedside Disposition Plan: Discharge home in 24 hours   Consultants:  PCCM Neurology Gastroenterology Cardiology  Procedures:  2/15: Lumbar puncture 2/16: EEG  Antimicrobials: Cephalexin Ceftriaxone Doxycycline Fluconazole   Subjective: Patient with no concerns this  morning.  Objective: BP 114/72 (BP Location: Left Arm)   Pulse 93   Temp (!) 97.4 F (36.3 C) (Oral)   Resp 19  Wt 96.3 kg   LMP  (LMP Unknown)   SpO2 97%   BMI 35.33 kg/m   Examination:  General exam: Appears calm and comfortable Respiratory system: Clear to auscultation. Respiratory effort normal. Cardiovascular system: S1 & S2 heard, RRR. No murmurs, rubs, gallops or clicks. Gastrointestinal system: Abdomen is nondistended, soft and nontender. Normal bowel sounds heard. Central nervous system: Alert and oriented to person and city/state. Musculoskeletal: No edema. No calf tenderness Skin: No cyanosis. No rashes   Data Reviewed: I have personally reviewed following labs and imaging studies  CBC Lab Results  Component Value Date   WBC 8.5 07/15/2022   RBC 3.89 07/15/2022   HGB 10.9 (L) 07/15/2022   HCT 32.4 (L) 07/15/2022   MCV 83.3 07/15/2022   MCH 28.0 07/15/2022   PLT 101 (L) 07/15/2022   MCHC 33.6 07/15/2022   RDW 16.0 (H) 07/15/2022   LYMPHSABS 1.1 07/03/2022   MONOABS 1.3 (H) 07/03/2022   EOSABS 0.0 07/03/2022   BASOSABS 0.0 [id number]     Last metabolic panel Lab Results  Component Value Date   NA 131 (L) 07/15/2022   K 3.9 07/15/2022   CL 106 07/15/2022   CO2 19 (L) 07/15/2022   BUN 6 (L) 07/15/2022   CREATININE 0.78 07/15/2022   GLUCOSE 100 (H) 07/15/2022   GFRNONAA >60 07/15/2022   GFRAA 57 (L) 03/03/2012   CALCIUM 8.1 (L) 07/15/2022   PHOS 2.5 07/10/2022   PROT 5.1 (L) 07/08/2022   ALBUMIN 1.5 (L) 07/08/2022   BILITOT 0.8 07/08/2022   ALKPHOS 71 07/08/2022   AST 57 (H) 07/08/2022   ALT 45 (H) 07/08/2022   ANIONGAP 6 07/15/2022    GFR: Estimated Creatinine Clearance: 62.2 mL/min (by C-G formula based on SCr of 0.78 mg/dL).  Recent Results (from the past 240 hour(s))  Culture, blood (Routine X 2) w Reflex to ID Panel     Status: None (Preliminary result)   Collection Time: 07/12/22 10:45 AM   Specimen: BLOOD RIGHT HAND  Result  Value Ref Range Status   Specimen Description BLOOD RIGHT HAND  Final   Special Requests   Final    BOTTLES DRAWN AEROBIC AND ANAEROBIC Blood Culture results may not be optimal due to an inadequate volume of blood received in culture bottles   Culture   Final    NO GROWTH 2 DAYS Performed at [facility], [place] [address] [place], [place] [address]    Report Status PENDING  Incomplete  Culture, blood (Routine X 2) w Reflex to ID Panel     Status: Abnormal (Preliminary result)   Collection Time: 07/12/22 10:54 AM   Specimen: BLOOD LEFT HAND  Result Value Ref Range Status   Specimen Description BLOOD LEFT HAND  Final   Special Requests   Final    BOTTLES DRAWN AEROBIC ONLY Blood Culture results may not be optimal due to an inadequate volume of blood received in culture bottles   Culture  Setup Time   Final    AEROBIC BOTTLE ONLY GRAM POSITIVE COCCI IN CLUSTERS CRITICAL RESULT CALLED TO, READ BACK BY AND VERIFIED WITH: PHARMD J. LEDFORD 07/13/22 @ 2209 BY AB    Culture (A)  Final    STAPHYLOCOCCUS SPECIES (COAGULASE NEGATIVE) THE SIGNIFICANCE OF ISOLATING THIS ORGANISM FROM A SINGLE SET OF BLOOD CULTURES WHEN MULTIPLE SETS ARE DRAWN IS UNCERTAIN. PLEASE NOTIFY THE MICROBIOLOGY DEPARTMENT WITHIN ONE WEEK IF SPECIATION AND SENSITIVITIES ARE REQUIRED. Performed at [facility], [place]  [address] [place], [place] [address]    Report Status PENDING  Incomplete  Blood Culture ID Panel (Reflexed)     Status: Abnormal   Collection Time: 07/12/22 10:54 AM  Result Value Ref Range Status   Enterococcus faecalis NOT DETECTED NOT DETECTED Final   Enterococcus Faecium NOT DETECTED NOT DETECTED Final   Listeria monocytogenes NOT DETECTED NOT DETECTED Final   Staphylococcus species DETECTED (A) NOT DETECTED Final    Comment: CRITICAL RESULT CALLED TO, READ BACK BY AND VERIFIED WITH: PHARMD J. LEDFORD 07/13/22 @ 2209 BY AB    Staphylococcus aureus (BCID) NOT DETECTED NOT DETECTED Final    Staphylococcus epidermidis NOT DETECTED NOT DETECTED Final   Staphylococcus lugdunensis NOT DETECTED NOT DETECTED Final   Streptococcus species NOT DETECTED NOT DETECTED Final   Streptococcus agalactiae NOT DETECTED NOT DETECTED Final   Streptococcus pneumoniae NOT DETECTED NOT DETECTED Final   Streptococcus pyogenes NOT DETECTED NOT DETECTED Final   A.calcoaceticus-baumannii NOT DETECTED NOT DETECTED Final   Bacteroides fragilis NOT DETECTED NOT DETECTED Final   Enterobacterales NOT DETECTED NOT DETECTED Final   Enterobacter cloacae complex NOT DETECTED NOT DETECTED Final   Escherichia coli NOT DETECTED NOT DETECTED Final   Klebsiella aerogenes NOT DETECTED NOT DETECTED Final   Klebsiella oxytoca NOT DETECTED NOT DETECTED Final   Klebsiella pneumoniae NOT DETECTED NOT DETECTED Final   Proteus species NOT DETECTED NOT DETECTED Final   Salmonella species NOT DETECTED NOT DETECTED Final   Serratia marcescens NOT DETECTED NOT DETECTED Final   Haemophilus influenzae NOT DETECTED NOT DETECTED Final   Neisseria meningitidis NOT DETECTED NOT DETECTED Final   Pseudomonas aeruginosa NOT DETECTED NOT DETECTED Final   Stenotrophomonas maltophilia NOT DETECTED NOT DETECTED Final   Candida albicans NOT DETECTED NOT DETECTED Final   Candida auris NOT DETECTED NOT DETECTED Final   Candida glabrata NOT DETECTED NOT DETECTED Final   Candida krusei NOT DETECTED NOT DETECTED Final   Candida parapsilosis NOT DETECTED NOT DETECTED Final   Candida tropicalis NOT DETECTED NOT DETECTED Final   Cryptococcus neoformans/gattii NOT DETECTED NOT DETECTED Final    Comment: Performed at [facility], [address] [place], [place] [address]      Radiology Studies: No results found.    LOS: 15 days    Cordelia Poche, MD [facility] 07/15/2022, 6:06 PM   If 7PM-7AM, please contact night-coverage [email or web address]

## 2022-07-15 NOTE — Progress Notes (Signed)
Mobility Specialist Progress Note   07/15/22 1545  Mobility  Activity Transferred from chair to bed  Level of Assistance +2 (takes two people) (MaxA)  Assistive Device Stedy  Activity Response Tolerated poorly  Mobility Referral Yes  $Mobility charge 1 Mobility   Pre Mobility: 89 HR During Mobility: 140 HR Post Mobility: 116 HR  RN requesting assistance to get pt back to bed. X3 STS requiring MaxA w/ the stedy d/t general weakness and deconditioned state + tactile cues to initiate hip extension. Increased fatigue w/ every stand attempt but no incidents present. Left in bed w/ call bell in reach and  bed alarm on.     Holland Falling Mobility Specialist Please contact via SecureChat or  Rehab office at [phone]

## 2022-07-16 DIAGNOSIS — G9341 Metabolic encephalopathy: Secondary | ICD-10-CM | POA: Diagnosis not present

## 2022-07-16 LAB — GLUCOSE, CAPILLARY
Glucose-Capillary: 92 mg/dL (ref 70–99)
Glucose-Capillary: 124 mg/dL — ABNORMAL HIGH (ref 70–99)

## 2022-07-16 LAB — CULTURE, BLOOD (ROUTINE X 2)

## 2022-07-16 MED ORDER — CEFADROXIL 500 MG PO CAPS: 500.0000 mg | ORAL_CAPSULE | Freq: Two times a day (BID) | ORAL | 0 refills | Status: AC

## 2022-07-16 MED ORDER — POLYETHYLENE GLYCOL 3350 17 GM/SCOOP PO POWD: 17.0000 g | Freq: Two times a day (BID) | ORAL | 0 refills | Status: DC

## 2022-07-16 MED ORDER — DOXYCYCLINE HYCLATE 100 MG PO CAPS: 100.0000 mg | ORAL_CAPSULE | Freq: Two times a day (BID) | ORAL | 0 refills | Status: AC

## 2022-07-16 MED ORDER — POTASSIUM CHLORIDE 20 MEQ PO PACK: 20.0000 meq | PACK | Freq: Every day | ORAL | 0 refills | Status: DC

## 2022-07-16 MED ORDER — FUROSEMIDE 20 MG PO TABS: 20.0000 mg | ORAL_TABLET | Freq: Every day | ORAL | 0 refills | Status: DC

## 2022-07-16 MED ORDER — FUROSEMIDE 40 MG PO TABS: 40.0000 mg | ORAL_TABLET | Freq: Every day | ORAL | Status: DC

## 2022-07-16 MED ORDER — ENSURE MAX PROTEIN PO LIQD: 11.0000 [oz_av] | Freq: Two times a day (BID) | ORAL | Status: DC

## 2022-07-16 NOTE — Consult Note (Addendum)
Urology Consult   Physician requesting consult: Gardenia Phlegm, MD  Reason for consult: Urinary retention  History of Present Illness: Barbara Nelson is a 82 y.o. with a past medical history of diastolic heart failure, chronic respiratory failure on 2L oxygen, hypertension who was admitted with acute metabolic encephalopathy.  Urology has been consulted for acute urinary retention.  Of note, she was found to have constipation requiring fecal disimpaction and aggressive bowel regimen.  She was also found to have acute urinary retention.  Foley catheter was placed on 07/13/2022. UA was unremarkable without suspicion for infection. She failed a void trial on 07/16/2022 and Foley catheter was replaced.  Prior to hospitalization, patient denies difficulties with urination.  She reported a fair flow stream.  She denies sensation of bladder emptying.  She denies history of urolithiasis.  She denies history of recurrent urinary tract infections.  She denies gross hematuria or dysuria.  She is currently tolerating Foley catheter well.  She does have a history of diabetes.  She has very poor mobility and both PT and OT recommended SNF however family has declined SNF and will take patient home with home health.  Past Medical History:  Diagnosis Date   Benign essential HTN 10/31/2015   Cardiac tamponade due to viral pericarditis    LBBB (left bundle branch block)    Lung mass    fungal infection s/p resection at [facility]   Postoperative atrial fibrillation [facility])    Pulmonary nodule     Past Surgical History:  Procedure Laterality Date   ABDOMINAL HYSTERECTOMY  1988   chest tube placement  09/03/11   LUNG REMOVAL, PARTIAL     right knee arthoscopy with meniscal repair     subxiphoid pericardial window and drainage anterior partial pericardiectomy  09/03/11     Current Hospital Medications:  Home meds:  No current facility-administered medications on file prior to encounter.   Current Outpatient  Medications on File Prior to Encounter  Medication Sig Dispense Refill   acetaminophen (TYLENOL) 325 MG tablet Take 2 tablets (650 mg total) by mouth every 6 (six) hours as needed for mild pain (or Fever >/= 101).     albuterol (PROVENTIL) (2.5 MG/3ML) 0.083% nebulizer solution Take 3 mLs (2.5 mg total) by nebulization every 2 (two) hours as needed for wheezing or shortness of breath. 75 mL 12   bisacodyl (DULCOLAX) 10 MG suppository Place 1 suppository (10 mg total) rectally daily as needed for moderate constipation. 12 suppository 0   diclofenac Sodium (VOLTAREN) 1 % GEL Apply 2 g topically 4 (four) times daily. (Patient taking differently: Apply 2 g topically 4 (four) times daily. 0900, 1300, 1700, 2100)     feeding supplement (ENSURE ENLIVE / ENSURE PLUS) LIQD Take 237 mLs by mouth 3 (three) times daily between meals. (Patient taking differently: Take 237 mLs by mouth 3 (three) times daily between meals. 1000, 1400, 2200) 237 mL 12   fluticasone (FLONASE) 50 MCG/ACT nasal spray Place 2 sprays into both nostrils daily. (Patient taking differently: Place 2 sprays into both nostrils every morning.)  2   furosemide (LASIX) 40 MG tablet Take 1 tablet (40 mg total) by mouth daily. (Patient taking differently: Take 40 mg by mouth every morning.) 30 tablet 11   insulin aspart (NOVOLOG) 100 UNIT/ML injection 0-9 Units, Subcutaneous, 3 times daily with meals,  CBG < 70: Implement Hypoglycemia measures  CBG 70 - 120: 0 units  CBG 121 - 150: 1 unit  CBG 151 - 200:  2 units  CBG 201 - 250: 3 units  CBG 251 - 300: 5 units  CBG 301 - 350: 7 units  CBG 351 - 400: 9 units  CBG > 400: call MD 10 mL 11   magic mouthwash SOLN Take 15 mLs by mouth 4 (four) times daily as needed for mouth pain. (Patient taking differently: Take 15 mLs by mouth 4 (four) times daily as needed for mouth pain. 0900, 1300, 1700, 2100)  0   magnesium hydroxide (MILK OF MAGNESIA) 400 MG/5ML suspension Take 15 mLs by mouth daily as needed  for mild constipation. 355 mL 0   metFORMIN (GLUCOPHAGE-XR) 500 MG 24 hr tablet Take 500 mg by mouth daily with supper.     metoprolol succinate (TOPROL-XL) 25 MG 24 hr tablet Take 0.5 tablets (12.5 mg total) by mouth daily. 90 tablet 1   Multiple Vitamin (MULTIVITAMIN WITH MINERALS) TABS tablet Take 1 tablet by mouth daily. (Patient taking differently: Take 1 tablet by mouth every morning.)     ondansetron (ZOFRAN) 4 MG tablet Take 4 mg by mouth every 6 (six) hours as needed for nausea or vomiting.     OXYGEN Inhale 2 L into the lungs daily.     polyethylene glycol (MIRALAX / GLYCOLAX) 17 g packet Take 17 g by mouth daily. (Patient taking differently: Take 17 g by mouth every morning.) 14 each 0   potassium chloride SA (KLOR-CON M) 20 MEQ tablet Take 1 tablet (20 mEq total) by mouth daily. (Patient taking differently: Take 20 mEq by mouth every morning.)     Propylene Glycol (SYSTANE COMPLETE) 0.6 % SOLN Place 2 drops into both eyes 3 (three) times daily. 0900, 1300, 1700     senna-docusate (SENOKOT-S) 8.6-50 MG tablet Take 1 tablet by mouth at bedtime.     triamcinolone ointment (KENALOG) 0.5 % Apply 1 Application topically daily as needed (itching). Apply topically to chest every day as needed for itching.     Potassium Chloride 10 % SOLN Take 15 mLs by mouth every morning. Mix 15 mL (20 MEQ) with juice and drink by mouth once daily for supplement.       Scheduled Meds:  bisacodyl  10 mg Rectal Daily   Chlorhexidine Gluconate Cloth  6 each Topical Daily   doxycycline  100 mg Oral Q12H   fluticasone  2 spray Each Nare q morning   insulin aspart  0-6 Units Subcutaneous TID WC   lidocaine   Topical TID   magic mouthwash  5 mL Oral QID   magnesium citrate  1 Bottle Oral Once   metoprolol succinate  12.5 mg Oral QHS   multivitamin with minerals  1 tablet Oral Daily   polyethylene glycol  17 g Oral BID   potassium chloride  40 mEq Oral Once   Ensure Max Protein  11 oz Oral BID   Continuous  Infusions:  cefTRIAXone (ROCEPHIN)  IV 2 g (07/15/22 1525)   PRN Meds:.acetaminophen **OR** acetaminophen, bisacodyl, gadobutrol, guaiFENesin, ipratropium-albuterol, menthol-cetylpyridinium, milk and molasses, ondansetron (ZOFRAN) IV  Allergies:  Allergies  Allergen Reactions   Latex Rash   Penicillins Rash and Itching   Tape Hives and Rash   Z-Pak [Azithromycin] Rash and Other (See Comments)    Mouth sores   Eggs Or Egg-Derived Products Nausea And Vomiting   Amoxil [Amoxicillin] Rash   Sulfa Antibiotics Rash    Family History  Problem Relation Age of Onset   Hypertension Father    Stroke Father  Hypertension Mother    Ovarian cancer Mother    Prostate cancer Brother    Thyroid disease Sister    Hypertension Sister    Leukemia Maternal Aunt     Social History:  reports that she quit smoking about 43 years ago. Her smoking use included cigarettes. She has a 15.00 pack-year smoking history. She has never used smokeless tobacco. She reports that she does not drink alcohol and does not use drugs.  ROS: A complete review of systems was performed.  All systems are negative except for pertinent findings as noted.  Physical Exam:  Vital signs in last 24 hours: Temp:  [97.4 F (36.3 C)-97.8 F (36.6 C)] 97.7 F (36.5 C) (02/29 0800) Pulse Rate:  [80-98] 84 (02/29 0800) Resp:  [19-24] 22 (02/29 0800) BP: (102-132)/(66-78) 123/72 (02/29 0800) SpO2:  [93 %-97 %] 94 % (02/29 0800) Constitutional:  Alert and oriented, No acute distress Cardiovascular: Regular rate and rhythm Respiratory: Normal respiratory effort, Lungs clear bilaterally GI: Abdomen is soft, nontender, nondistended, no abdominal masses GU: No CVA tenderness Neurologic: Grossly intact, no focal deficits Psychiatric: Normal mood and affect  Laboratory Data:  Recent Labs    07/14/22 0333 07/15/22 0321  WBC 8.9 8.5  HGB 11.2* 10.9*  HCT 33.6* 32.4*  PLT 90* 101*    Recent Labs    07/14/22 0333  07/15/22 0321  NA 130* 131*  K 3.0* 3.9  CL 102 106  GLUCOSE 83 100*  BUN 7* 6*  CALCIUM 8.0* 8.1*  CREATININE 0.83 0.78     Results for orders placed or performed during the hospital encounter of 06/30/22 (from the past 24 hour(s))  Glucose, capillary     Status: Abnormal   Collection Time: 07/15/22  5:19 PM  Result Value Ref Range   Glucose-Capillary 101 (H) 70 - 99 mg/dL  Glucose, capillary     Status: Abnormal   Collection Time: 07/15/22  9:11 PM  Result Value Ref Range   Glucose-Capillary 104 (H) 70 - 99 mg/dL  Glucose, capillary     Status: None   Collection Time: 07/16/22  8:59 AM  Result Value Ref Range   Glucose-Capillary 92 70 - 99 mg/dL   Recent Results (from the past 240 hour(s))  Culture, blood (Routine X 2) w Reflex to ID Panel     Status: None (Preliminary result)   Collection Time: 07/12/22 10:45 AM   Specimen: BLOOD RIGHT HAND  Result Value Ref Range Status   Specimen Description BLOOD RIGHT HAND  Final   Special Requests   Final    BOTTLES DRAWN AEROBIC AND ANAEROBIC Blood Culture results may not be optimal due to an inadequate volume of blood received in culture bottles   Culture   Final    NO GROWTH 2 DAYS Performed at [facility], [place] [address] [place], [place] [address]    Report Status PENDING  Incomplete  Culture, blood (Routine X 2) w Reflex to ID Panel     Status: Abnormal   Collection Time: 07/12/22 10:54 AM   Specimen: BLOOD LEFT HAND  Result Value Ref Range Status   Specimen Description BLOOD LEFT HAND  Final   Special Requests   Final    BOTTLES DRAWN AEROBIC ONLY Blood Culture results may not be optimal due to an inadequate volume of blood received in culture bottles   Culture  Setup Time   Final    AEROBIC BOTTLE ONLY GRAM POSITIVE COCCI IN CLUSTERS CRITICAL RESULT CALLED  TO, READ BACK BY AND VERIFIED WITH: PHARMD J. LEDFORD 07/13/22 @ 2209 BY AB    Culture (A)  Final    STAPHYLOCOCCUS SPECIES (COAGULASE NEGATIVE) THE  SIGNIFICANCE OF ISOLATING THIS ORGANISM FROM A SINGLE SET OF BLOOD CULTURES WHEN MULTIPLE SETS ARE DRAWN IS UNCERTAIN. PLEASE NOTIFY THE MICROBIOLOGY DEPARTMENT WITHIN ONE WEEK IF SPECIATION AND SENSITIVITIES ARE REQUIRED. Performed at [facility], [place] [address], [place], [place] [address]    Report Status 07/16/2022 FINAL  Final  Blood Culture ID Panel (Reflexed)     Status: Abnormal   Collection Time: 07/12/22 10:54 AM  Result Value Ref Range Status   Enterococcus faecalis NOT DETECTED NOT DETECTED Final   Enterococcus Faecium NOT DETECTED NOT DETECTED Final   Listeria monocytogenes NOT DETECTED NOT DETECTED Final   Staphylococcus species DETECTED (A) NOT DETECTED Final    Comment: CRITICAL RESULT CALLED TO, READ BACK BY AND VERIFIED WITH: PHARMD J. LEDFORD 07/13/22 @ 2209 BY AB    Staphylococcus aureus (BCID) NOT DETECTED NOT DETECTED Final   Staphylococcus epidermidis NOT DETECTED NOT DETECTED Final   Staphylococcus lugdunensis NOT DETECTED NOT DETECTED Final   Streptococcus species NOT DETECTED NOT DETECTED Final   Streptococcus agalactiae NOT DETECTED NOT DETECTED Final   Streptococcus pneumoniae NOT DETECTED NOT DETECTED Final   Streptococcus pyogenes NOT DETECTED NOT DETECTED Final   A.calcoaceticus-baumannii NOT DETECTED NOT DETECTED Final   Bacteroides fragilis NOT DETECTED NOT DETECTED Final   Enterobacterales NOT DETECTED NOT DETECTED Final   Enterobacter cloacae complex NOT DETECTED NOT DETECTED Final   Escherichia coli NOT DETECTED NOT DETECTED Final   Klebsiella aerogenes NOT DETECTED NOT DETECTED Final   Klebsiella oxytoca NOT DETECTED NOT DETECTED Final   Klebsiella pneumoniae NOT DETECTED NOT DETECTED Final   Proteus species NOT DETECTED NOT DETECTED Final   Salmonella species NOT DETECTED NOT DETECTED Final   Serratia marcescens NOT DETECTED NOT DETECTED Final   Haemophilus influenzae NOT DETECTED NOT DETECTED Final   Neisseria meningitidis NOT DETECTED NOT  DETECTED Final   Pseudomonas aeruginosa NOT DETECTED NOT DETECTED Final   Stenotrophomonas maltophilia NOT DETECTED NOT DETECTED Final   Candida albicans NOT DETECTED NOT DETECTED Final   Candida auris NOT DETECTED NOT DETECTED Final   Candida glabrata NOT DETECTED NOT DETECTED Final   Candida krusei NOT DETECTED NOT DETECTED Final   Candida parapsilosis NOT DETECTED NOT DETECTED Final   Candida tropicalis NOT DETECTED NOT DETECTED Final   Cryptococcus neoformans/gattii NOT DETECTED NOT DETECTED Final    Comment: Performed at [facility], [address], [place], [place] [address]    Renal Function: Recent Labs    07/10/22 [id number] 07/11/22 0246 07/12/22 0303 07/13/22 0410 07/14/22 0333 07/15/22 0321  CREATININE 0.91 0.86 0.88 0.79 0.83 0.78   Estimated Creatinine Clearance: 62.2 mL/min (by C-G formula based on SCr of 0.78 mg/dL).  Radiologic Imaging: No results found.  I independently reviewed the above imaging studies.  Impression/Recommendation: Urinary retention: Etiology likely multifactorial given concomitant constipation, poor mobility and metabolic encephalopathy.  -Recommend leaving Foley catheter in place for least 5 to 7 days and attempt void trial in the office.  Will arrange for outpatient follow-up. -Discussed possible etiologies including underlying diabetes, constipation.  Recommended improve mobility and bowel regimen. -Discussed if she is unable to pass blood from the office, we will proceed with urodynamics. -At this time, I do not recommend tamsulosin given limited benefit in this elderly patient with risk of orthostatic hypotension. -  Discussed with family at bedside. -Following peripherally.  Please call with questions.  Matt R. Jocilyn Trego MD 07/16/2022, 12:13 PM  [facility]  Pager: [phone]   CC: Gardenia Phlegm, MD

## 2022-07-16 NOTE — Discharge Instructions (Addendum)
Barbara Nelson,  You were in the hospital with a few issues:  Confusion: this may have been related to dehydration but could also have been related to infection. Thankfully, your confusion has improved. Constipation: this was improved with an enema and adjusted bowel regimen. Please keep good, regular and soft bowel movements. Urinary retention: this is possibly related to generalized weakness, decreased mobility, and is complicated by age and duration of hospital stay. You will need to keep the urinary catheter in place and follow-up with the urologist Cellulitis: please continue your antibiotics as prescribed Heart failure: this appears to be stable. Please hold your Lasix until you get repeat labs from your PCP. Please limit your water intake to around 1500 mL Weakness: the recommendation was for you to return to rehab, but you have elected to discharge home. Please ensure care is taken to avoid falls at home with your limited ability to mobilize

## 2022-07-16 NOTE — Progress Notes (Signed)
Physical Therapy Treatment Patient Details Name: Barbara Nelson MRN: [id number] DOB: [birth year] Today's Date: 07/16/2022   History of Present Illness Pt is an 82 y/o female admitted for AMS in setting of HCAP PNA. Also noted dehydration and fecal impaction. Recent admission 2/1-2/10 for same. PMH: DM2, HTN, CHF.    PT Comments    Pt tolerated today's session well, reports feeling weaker but reports she needs to stand more. MaxAx1 for bed mobility today, performing seated TherEx for warm-up prior to standing. MinA required while seated EOB for balance due to posterior lean, pt correcting with cues but unable to maintain with dynamic sitting or fatigue. Pt performing several standing trials with use of Stedy for hygiene and for transfer to chair. MaxAx2 required for standing with cueing for upright posture and hip extension, lateral sway facilitated to place Stedy bottom piece underneath due to hip/trunk flexion in standing. Educated pt and family on continuing to perform exercises while in the chair to maintain strength, family agreeable. Acute PT will continue to follow as appropriate. Discharge plans updated as family plans to take pt home, will benefit from HHPT and hoyer lift at discharge. Goals extended x2 weeks.    Recommendations for follow up therapy are one component of a multi-disciplinary discharge planning process, led by the attending physician.  Recommendations may be updated based on patient status, additional functional criteria and insurance authorization.  Follow Up Recommendations  Home health PT (family wants to take pt home and declining SNF) Can patient physically be transported by private vehicle: No   Assistance Recommended at Discharge Frequent or constant Supervision/Assistance  Patient can return home with the following Assistance with cooking/housework;Assist for transportation;Help with stairs or ramp for entrance;Two people to help with walking and/or transfers;Two  people to help with bathing/dressing/bathroom;Direct supervision/assist for medications management   Equipment Recommendations  Other (comment) (hoyer lift)    Recommendations for Other Services       Precautions / Restrictions Precautions Precautions: Fall;Other (comment) Precaution Comments: monitor O2, HR Restrictions Weight Bearing Restrictions: No     Mobility  Bed Mobility Overal bed mobility: Needs Assistance Bed Mobility: Supine to Sit     Supine to sit: HOB elevated, Max assist     General bed mobility comments: assist for BLE management and trunk support, use of bed pads for scooting hips forwards    Transfers Overall transfer level: Needs assistance Equipment used: Ambulation equipment used Transfers: Bed to chair/wheelchair/BSC, Sit to/from Stand Sit to Stand: +2 physical assistance, Max assist, +2 safety/equipment           General transfer comment: maxAx2 to stand with [facility] for transfer and clean-up. Utilizied stedy for pt to pull up, performing 3 stands prior to transfer to clean-up bottom Transfer via Lift Equipment: Stedy  Ambulation/Gait                   Stairs             Wheelchair Mobility    Modified Rankin (Stroke Patients Only)       Balance Overall balance assessment: Needs assistance Sitting-balance support: Feet supported, Bilateral upper extremity supported Sitting balance-Leahy Scale: Poor Sitting balance - Comments: maintaining static sitting balance with minG-minA, with posterior lean requiring cueing to correct Postural control: Posterior lean                                  Cognition Arousal/Alertness:  Awake/alert Behavior During Therapy: Flat affect Overall Cognitive Status: Impaired/Different from baseline Area of Impairment: Memory, Following commands, Problem solving, Attention, Safety/judgement, Awareness                   Current Attention Level: Selective Memory:  Decreased short-term memory Following Commands: Follows one step commands consistently, Follows one step commands with increased time Safety/Judgement: Decreased awareness of safety, Decreased awareness of deficits Awareness: Intellectual Problem Solving: Slow processing, Difficulty sequencing, Decreased initiation General Comments: pt more alert during today's session, following commands with increased time        Exercises General Exercises - Lower Extremity Ankle Circles/Pumps: AROM, Both, 10 reps, Seated Long Arc Quad: AROM, Both, 10 reps, Seated Hip Flexion/Marching: AROM, Both, 10 reps, Seated    General Comments General comments (skin integrity, edema, etc.): VSS on room air      Pertinent Vitals/Pain Pain Assessment Pain Assessment: Faces Faces Pain Scale: Hurts a little bit Pain Descriptors / Indicators: Grimacing Pain Intervention(s): Monitored during session, Limited activity within patient's tolerance    Home Living                          Prior Function            PT Goals (current goals can now be found in the care plan section) Acute Rehab PT Goals Patient Stated Goal: family reports goal is to get more independent to return home PT Goal Formulation: With patient/family Time For Goal Achievement: 07/30/22 Potential to Achieve Goals: Fair Progress towards PT goals: Progressing toward goals (goals extended today)    Frequency    Min 3X/week      PT Plan Discharge plan needs to be updated    Co-evaluation              AM-PAC PT "6 Clicks" Mobility   Outcome Measure  Help needed turning from your back to your side while in a flat bed without using bedrails?: A Lot Help needed moving from lying on your back to sitting on the side of a flat bed without using bedrails?: A Lot Help needed moving to and from a bed to a chair (including a wheelchair)?: Total Help needed standing up from a chair using your arms (e.g., wheelchair or  bedside chair)?: Total Help needed to walk in hospital room?: Total Help needed climbing 3-5 steps with a railing? : Total 6 Click Score: 8    End of Session Equipment Utilized During Treatment: Gait belt Activity Tolerance: Patient tolerated treatment well Patient left: with family/visitor present;in chair;with call bell/phone within reach;with chair alarm set Nurse Communication: Mobility status;Need for lift equipment PT Visit Diagnosis: Difficulty in walking, not elsewhere classified (R26.2);Muscle weakness (generalized) (M62.81);Other abnormalities of gait and mobility (R26.89)     Time: 1001-1039 PT Time Calculation (min) (ACUTE ONLY): 38 min  Charges:  $Therapeutic Exercise: 8-22 mins $Therapeutic Activity: 23-37 mins                     Charlynne Cousins, PT DPT Acute Rehabilitation Services Office [phone]    Luvenia Heller 07/16/2022, 1:40 PM

## 2022-07-16 NOTE — Progress Notes (Signed)
Physical Therapy Treatment Patient Details Name: Barbara Nelson MRN: [id number] DOB: 12-Sep-1940 Today's Date: 07/16/2022   History of Present Illness Pt is an 82 y/o female admitted for AMS in setting of HCAP PNA. Also noted dehydration and fecal impaction. Recent admission 2/1-2/10 for same. PMH: DM2, HTN, CHF.    PT Comments    Pt seen again for return to bed by nursing request. Pt continuing to require maxAx2 to stand with use of Stedy, cueing for upright posture and hip extension provided, facilitation required for lateral sway for Stedy bottom due to hip and trunk flexion in standing. Pt performing ~3 stands today with Stedy, fatiguing after ~10 seconds but recovering with a seated rest break. Pt transferred back to bed and left with NT for clean-up after BM. Acute PT will continue to follow pt as appropriate, discharge recommendations remain the same.     Recommendations for follow up therapy are one component of a multi-disciplinary discharge planning process, led by the attending physician.  Recommendations may be updated based on patient status, additional functional criteria and insurance authorization.  Follow Up Recommendations  Home health PT (family wants to take pt home and declining SNF) Can patient physically be transported by private vehicle: No   Assistance Recommended at Discharge Frequent or constant Supervision/Assistance  Patient can return home with the following Assistance with cooking/housework;Assist for transportation;Help with stairs or ramp for entrance;Two people to help with walking and/or transfers;Two people to help with bathing/dressing/bathroom;Direct supervision/assist for medications management   Equipment Recommendations  Other (comment) (hoyer lift)    Recommendations for Other Services       Precautions / Restrictions Precautions Precautions: Fall;Other (comment) Precaution Comments: monitor O2, HR Restrictions Weight Bearing Restrictions:  No     Mobility  Bed Mobility Overal bed mobility: Needs Assistance Bed Mobility: Supine to Sit     Supine to sit: HOB elevated, Max assist     General bed mobility comments: left pt EOB with NT to finish clean-up    Transfers Overall transfer level: Needs assistance Equipment used: Ambulation equipment used Transfers: Bed to chair/wheelchair/BSC, Sit to/from Stand Sit to Stand: +2 physical assistance, Max assist, +2 safety/equipment           General transfer comment: maxAx2 to stand with [facility] for transfer and clean-up. Utilizied stedy for pt to pull up, performing 2 stands prior to transfer to clean-up bottom Transfer via Lift Equipment: [employer]  Ambulation/Gait                   Stairs             Wheelchair Mobility    Modified Rankin (Stroke Patients Only)       Balance Overall balance assessment: Needs assistance Sitting-balance support: Feet supported, Bilateral upper extremity supported Sitting balance-Leahy Scale: Poor Sitting balance - Comments: maintaining static sitting balance with minG-minA, with posterior lean requiring cueing to correct Postural control: Posterior lean Standing balance support: Bilateral upper extremity supported, During functional activity Standing balance-Leahy Scale: Zero Standing balance comment: requiring maxA to maintain standing with BUE use of Stedy                            Cognition Arousal/Alertness: Awake/alert Behavior During Therapy: Flat affect Overall Cognitive Status: Impaired/Different from baseline Area of Impairment: Memory, Following commands, Problem solving, Attention, Safety/judgement, Awareness  Current Attention Level: Selective Memory: Decreased short-term memory Following Commands: Follows one step commands consistently, Follows one step commands with increased time Safety/Judgement: Decreased awareness of safety, Decreased awareness of  deficits Awareness: Intellectual Problem Solving: Slow processing, Difficulty sequencing, Decreased initiation General Comments: pt fatigued but following commands with increased time        Exercises General Exercises - Lower Extremity Ankle Circles/Pumps: AROM, Both, 10 reps, Seated Long Arc Quad: AROM, Both, 10 reps, Seated Hip Flexion/Marching: AROM, Both, 10 reps, Seated    General Comments General comments (skin integrity, edema, etc.): VSS on room air      Pertinent Vitals/Pain Pain Assessment Pain Assessment: Faces Faces Pain Scale: Hurts a little bit Pain Location: generalized Pain Descriptors / Indicators: Grimacing Pain Intervention(s): Limited activity within patient's tolerance, Monitored during session    Home Living                          Prior Function            PT Goals (current goals can now be found in the care plan section) Acute Rehab PT Goals Patient Stated Goal: family reports goal is to get more independent to return home PT Goal Formulation: With patient/family Time For Goal Achievement: 07/30/22 Potential to Achieve Goals: Fair Progress towards PT goals: Progressing toward goals    Frequency    Min 3X/week      PT Plan Current plan remains appropriate    Co-evaluation              AM-PAC PT "6 Clicks" Mobility   Outcome Measure  Help needed turning from your back to your side while in a flat bed without using bedrails?: A Lot Help needed moving from lying on your back to sitting on the side of a flat bed without using bedrails?: A Lot Help needed moving to and from a bed to a chair (including a wheelchair)?: Total Help needed standing up from a chair using your arms (e.g., wheelchair or bedside chair)?: Total Help needed to walk in hospital room?: Total Help needed climbing 3-5 steps with a railing? : Total 6 Click Score: 8    End of Session Equipment Utilized During Treatment: Gait belt Activity Tolerance:  Patient tolerated treatment well Patient left: with family/visitor present;with call bell/phone within reach;in bed;with nursing/sitter in room Nurse Communication: Mobility status;Need for lift equipment PT Visit Diagnosis: Difficulty in walking, not elsewhere classified (R26.2);Muscle weakness (generalized) (M62.81);Other abnormalities of gait and mobility (R26.89)     Time: [id number] PT Time Calculation (min) (ACUTE ONLY): 13 min  Charges:  $Therapeutic Exercise: 8-22 mins $Therapeutic Activity: 8-22 mins                     Charlynne Cousins, PT DPT Acute Rehabilitation Services Office [phone]    Luvenia Heller 07/16/2022, 1:51 PM

## 2022-07-16 NOTE — Discharge Summary (Addendum)
Physician Discharge Summary   Patient: Barbara Nelson MRN: [id number] DOB: 14-Jun-1940  Admit date:     06/30/2022  Discharge date: 07/16/22  Discharge Physician: Cordelia Poche, MD   PCP: Cari Caraway, MD   Recommendations at discharge:  PCP follow-up Urology follow-up Repeat CMP in 4-5 days  Discharge Diagnoses: Principal Problem:   Acute metabolic encephalopathy Active Problems:   Acute hypoxic respiratory failure ([place])   Essential hypertension   Hypercalcemia   Dehydration   Acute prerenal azotemia   Transaminitis   Constipation   DM2 (diabetes mellitus, type 2) (HCC)   Allergic rhinitis   Chronic diastolic CHF (congestive heart failure) (HCC)   Nausea and vomiting   Pressure injury of skin  Resolved Problems:   HCAP (healthcare-associated pneumonia)  Hospital Course: Barbara Nelson is a 82 y.o. female with a history of chronic diastolic heart failure, chronic respiratory failure on 2 L of oxygen, hypertension.  Patient presented secondary to mental status change, lethargy, vomiting.  Patient initially started on treatment for HCAP.  Thorough workup for altered mental status was unrevealing for etiology, however mental status eventually improved over time.  During hospitalization, patient was managed for fecal impaction with improved bowel regimen.  Electrolytes managed as needed.  Patient also treated for developed cellulitis of her left upper extremity. Foley catheter placed for acute urinary retention.  Assessment and Plan:  Acute metabolic encephalopathy Presumed initially secondary to dehydration and delirium, however mental status worsened despite hydration.  Workup significant for non-revealing CT head, MRI brain, ammonia, TSH, VBG, UDS, vitamin B1/B12, RPR, CSF, EEG.  Patient was started on high dose thiamine for which she completed.  Patient was also managed with antibiotics for presumed pneumonia in addition to left upper extremity cellulitis.  Patient's  mental status eventually improved.   Constipation Fecal impaction Patient underwent disimpaction in the emergency department. Gastroenterologist was consulted and recommended enema and bowel regimen.  Patient eventually with frequent bowel movements and resolution of impaction. Discharge with MiraLAX BID.   Emesis Unclear etiology.  GI was consulted without recommendation for endoscopic evaluation.  Barium swallow was ordered but unable to be completed secondary to patient noncooperation.  Emesis resolved.   Oral thrush Patient was managed on Magic mouthwash and Diflucan. Resolved.   Acute urinary retention Unclear etiology.  Foley catheter was placed on 2/26. Voiding trial attempted on 2/28, but failed. Foley catheter replaced prior to discharge on 2/29. Urology consulted per family request and recommend discharge with foley catheter and outpatient urology follow-up for voiding trial.   Chronic diastolic heart failure Stable.  Lasix held secondary to poor oral intake of fluids.  Cardiology consulted without inpatient recommendations. Will recommend to hold Lasix on discharge until repeat BMP is obtained.   Possible aspiration pneumonia Patient was treated empirically with antibiotics.  Repeat chest x-ray significant for worsened atelectasis versus pneumonia.  Patient without clinical features consistent with pneumonia at this time.  Patient is on room air.  Mild leukocytosis present but resolved.  Patient is currently on antibiotics for cellulitis. Continue incentive spirometry.   Left upper extremity cellulitis Cellulitis around IV site.  Appears possible IV infiltration.  Patient started empirically on ceftriaxone and doxycycline with improvement of cellulitis. Transition to Cefadroxil and Doxycycline to complete a 7 day course of antibiotics.   Diabetes mellitus type 2 Controlled for age.  Hemoglobin A1c is 6.9%. Patient is on metformin however family has not started administering this  medication; recommend continuing on discharge and following up with  PCP.   Elevated AST/ALT Right upper quadrant ultrasound unremarkable.  Improved.  Recommend outpatient CMP.   Hypokalemia Mild.  Patient treated with supplemental potassium.  Resolved.   Hypophosphatemia Mild.  Patient treated with phosphorus supplementation.  Resolved.   Thrombocytopenia Unclear etiology but likely related to acute illness.  Recommend outpatient CBC in 1 week.   Generalized weakness Patient evaluated by PT and OT who recommended SNF placement on discharge.  Discussed with family my significant concerns for safety at home with patient's current level of functional ability. Family continues to decline SNF placement and will take patient home with home health.   Consultants: PCCM, Cardiology, Neurology, Gastroenterology, Urology Procedures performed: 2/15: Lumbar puncture 2/16: EEG  Disposition: Home health Diet recommendation: Carb modified diet   DISCHARGE MEDICATION: Allergies as of 07/16/2022       Reactions   Latex Rash   Penicillins Rash, Itching   Tape Hives, Rash   Z-pak [azithromycin] Rash, Other (See Comments)   Mouth sores   Eggs Or Egg-derived Products Nausea And Vomiting   Amoxil [amoxicillin] Rash   Sulfa Antibiotics Rash        Medication List     STOP taking these medications    acetaminophen 325 MG tablet Commonly known as: TYLENOL   albuterol (2.5 MG/3ML) 0.083% nebulizer solution Commonly known as: PROVENTIL   bisacodyl 10 MG suppository Commonly known as: DULCOLAX   diclofenac Sodium 1 % Gel Commonly known as: VOLTAREN   fluticasone 50 MCG/ACT nasal spray Commonly known as: FLONASE   insulin aspart 100 UNIT/ML injection Commonly known as: novoLOG   magic mouthwash Soln   magnesium hydroxide 400 MG/5ML suspension Commonly known as: MILK OF MAGNESIA   multivitamin with minerals Tabs tablet   ondansetron 4 MG tablet Commonly known as: ZOFRAN    OXYGEN   polyethylene glycol 17 g packet Commonly known as: MIRALAX / GLYCOLAX Replaced by: polyethylene glycol powder 17 GM/SCOOP powder   Potassium Chloride 10 % Soln Replaced by: potassium chloride 20 MEQ packet   potassium chloride SA 20 MEQ tablet Commonly known as: KLOR-CON M   senna-docusate 8.6-50 MG tablet Commonly known as: Senokot-S   Systane Complete 0.6 % Soln Generic drug: Propylene Glycol   triamcinolone ointment 0.5 % Commonly known as: KENALOG       TAKE these medications    cefadroxil 500 MG capsule Commonly known as: DURICEF Take 1 capsule (500 mg total) by mouth 2 (two) times daily for 3 days.   doxycycline 100 MG capsule Commonly known as: VIBRAMYCIN Take 1 capsule (100 mg total) by mouth 2 (two) times daily for 3 days.   Ensure Max Protein Liqd Take 330 mLs (11 oz total) by mouth 2 (two) times daily. What changed:  how much to take when to take this   furosemide 20 MG tablet Commonly known as: Lasix Take 1 tablet (20 mg total) by mouth daily. Start taking on: July 21, 2022 What changed:  medication strength how much to take These instructions start on July 21, 2022. If you are unsure what to do until then, ask your doctor or other care provider.   metFORMIN 500 MG 24 hr tablet Commonly known as: GLUCOPHAGE-XR Take 500 mg by mouth daily with supper.   metoprolol succinate 25 MG 24 hr tablet Commonly known as: TOPROL-XL Take 0.5 tablets (12.5 mg total) by mouth daily.   polyethylene glycol powder 17 GM/SCOOP powder Commonly known as: MiraLax Take 17 g by mouth 2 (two) times daily.  Decrease to once daily if having watery stools. Replaces: polyethylene glycol 17 g packet   potassium chloride 20 MEQ packet Commonly known as: KLOR-CON Take 20 mEq by mouth daily for 3 doses. Replaces: Potassium Chloride 10 % Soln               Durable Medical Equipment  (From admission, onward)           Start     Ordered   07/14/22  1629  For home use only DME Hospital bed  Once       Comments: Therapeutic mattress  Question Answer Comment  Length of Need Lifetime   Patient has (list medical condition): debility   The above medical condition requires: Patient requires the ability to reposition frequently   Head must be elevated greater than: 30 degrees   Bed type Semi-electric   Hoyer Lift Yes   Support Surface: Gel Overlay      07/14/22 1629   07/14/22 1620  For home use only DME Bedside commode  Once       Question:  Patient needs a bedside commode to treat with the following condition  Answer:  Weakness   07/14/22 1620            Follow-up Information     [facility] [place] Follow up.   Why: For [facility] services Contact information: [place] [address] [phone]         [place]. Schedule an appointment as soon as possible for a visit.   Why: Office will call with appointment Contact information: [place]        Cari Caraway, MD. Schedule an appointment as soon as possible for a visit in 4 day(s).   Specialty: Family Medicine Why: For hospital follow-up and/or repeat labs Contact information: [place] [address] [phone]                Discharge Exam: BP 123/72 (BP Location: Left Arm)   Pulse 84   Temp 97.7 F (36.5 C) (Oral)   Resp (!) 22   Wt 96.3 kg   LMP  (LMP Unknown)   SpO2 94%   BMI 35.33 kg/m   General exam: Appears calm and comfortable Respiratory system: Clear to auscultation. Respiratory effort normal. Cardiovascular system: S1 & S2 heard, RRR. Gastrointestinal system: Abdomen is nondistended, soft and nontender. Normal bowel sounds heard. Central nervous system: Alert and oriented. Musculoskeletal: No edema. No calf tenderness   Condition at discharge: fair  The results of significant diagnostics from this  hospitalization (including imaging, microbiology, ancillary and laboratory) are listed below for reference.   Imaging Studies: DG Chest Port 1 View  Result Date: 07/12/2022 CLINICAL DATA:  Leukocytosis. EXAM: PORTABLE CHEST 1 VIEW COMPARISON:  Chest radiograph 06/30/2022. FINDINGS: Mild cardiomegaly is unchanged. Stable mediastinal contours. Increase in patchy bibasilar airspace disease. There may be developing right upper lobe opacity. Small bilateral pleural effusions. No pneumothorax. IMPRESSION: 1. Increase in patchy bibasilar airspace disease, may be atelectasis or pneumonia. Possible developing right upper lobe opacity. 2. Small bilateral pleural effusions. Electronically Signed   By: Keith Rake M.D.   On: 07/12/2022 12:18   DG Fluoro Rm 1-60 Min - No Report  Result Date: 07/10/2022 Fluoroscopy was utilized by the requesting physician.  No radiographic interpretation.   DG Abd Portable 1V  Result Date: 07/08/2022 CLINICAL DATA:  82 year old female with  history of vomiting. EXAM: PORTABLE ABDOMEN - 1 VIEW COMPARISON:  07/07/2022 FINDINGS: The bowel gas pattern is normal. No radio-opaque calculi or other significant radiographic abnormality are seen. IMPRESSION: Nonobstructive bowel gas pattern. Electronically Signed   By: Ruthann Cancer M.D.   On: 07/08/2022 08:35   DG Abd 2 Views  Result Date: 07/07/2022 CLINICAL DATA:  Abdominal pain. EXAM: ABDOMEN - 2 VIEW COMPARISON:  07/05/2022 FINDINGS: The bowel gas pattern is normal without gaseous distention. No radio-opaque calculi or other significant radiographic abnormality are seen. Increased stool noted consistent with constipation. IMPRESSION: Constipation. Electronically Signed   By: Sammie Bench M.D.   On: 07/07/2022 07:51   DG Abd Portable 2V  Result Date: 07/05/2022 CLINICAL DATA:  Fecal impaction. EXAM: PORTABLE ABDOMEN - 2 VIEW COMPARISON:  CT abdomen and pelvis 06/30/2022. FINDINGS: The bowel gas pattern is normal. There is  no evidence of free air. A large stool ball is present in the rectum. No radio-opaque calculi or other significant radiographic abnormality is seen. IMPRESSION: No change in a large stool ball in the rectum. Electronically Signed   By: Inge Rise M.D.   On: 07/05/2022 09:12   EEG adult  Result Date: 07/03/2022 Lora Havens, MD     07/03/2022 12:57 PM Patient Name: SCHELLY CALLERY MRN: [id number] Epilepsy Attending: Lora Havens Referring Physician/Provider: Mercy Riding, MD Date: 07/03/2022 Duration: 23.24 mins Patient history: 82 year old female with progressive encephalopathy. EEG to evaluate for seizure Level of alertness: Awake, asleep AEDs during EEG study: None Technical aspects: This EEG study was done with scalp electrodes positioned according to the 10-20 International system of electrode placement. Electrical activity was reviewed with band pass filter of 1-'70Hz'$ , sensitivity of 7 uV/mm, display speed of 65m/sec with a '60Hz'$  notched filter applied as appropriate. EEG data were recorded continuously and digitally stored.  Video monitoring was available and reviewed as appropriate. Description: No clear posterior dominant rhythm was seen. Sleep was characterized by sleep spindles (12 to 14 Hz), maximal frontocentral region. EEG showed continuous generalized 3 to 6 Hz theta-delta slowing. Hyperventilation and photic stimulation were not performed.   ABNORMALITY - Continuous slow, generalized IMPRESSION: This study is suggestive of moderate diffuse encephalopathy, nonspecific etiology. No seizures or epileptiform discharges were seen throughout the recording. Priyanka OBarbra Sarks  [place]domen Limited RUQ (LIVER/GB)  Result Date: 07/01/2022 CLINICAL DATA:  Elevated liver function tests. EXAM: ULTRASOUND ABDOMEN LIMITED RIGHT UPPER QUADRANT COMPARISON:  None Available. FINDINGS: Gallbladder: No gallstones or wall thickening visualized. No sonographic Murphy sign noted by sonographer. Common  bile duct: Diameter: Normal at 2 mm Liver: No focal lesion identified. Within normal limits in parenchymal echogenicity. Portal vein is patent on color Doppler imaging with normal direction of blood flow towards the liver. Other: None. IMPRESSION: Normal RIGHT upper quadrant ultrasound Electronically Signed   By: SSuzy BouchardM.D.   On: 07/01/2022 11:55   MR BRAIN WO CONTRAST  Result Date: 06/30/2022 CLINICAL DATA:  Stroke suspected EXAM: MRI HEAD WITHOUT CONTRAST TECHNIQUE: Multiplanar, multiecho pulse sequences of the brain and surrounding structures were obtained without intravenous contrast. COMPARISON:  No prior MRI available, correlation is made with CT head 06/30/2022 FINDINGS: Evaluation is limited by motion, which required less motion sensitive sequences. In addition, the patient could not remain still to complete the exam. Brain: No restricted diffusion to suggest acute or subacute infarct. No acute hemorrhage, mass, mass effect, or midline shift. No hydrocephalus or extra-axial collection. Vascular: Grossly normal arterial  flow voids. Skull and upper cervical spine: Grossly normal marrow signal. Sinuses/Orbits: Unable to evaluate. Other: None. IMPRESSION: Evaluation is limited by motion, as well as the inability of the patient to complete the exam. Within this limitation, no acute intracranial process. No evidence of acute or subacute infarct. Electronically Signed   By: Merilyn Baba M.D.   On: 06/30/2022 23:10   DG Chest Port 1 View  Result Date: 06/30/2022 CLINICAL DATA:  Cough and mental status change EXAM: PORTABLE CHEST 1 VIEW COMPARISON:  Chest x-ray 06/20/2022 FINDINGS: Heart is enlarged, unchanged. There is some minimal strandy and hazy opacities in both lung bases. There is no pneumothorax. There is a small left pleural effusion. No acute fractures are seen. IMPRESSION: 1. Minimal strandy and hazy opacities in both lung bases may represent atelectasis or infection. 2. Small left  pleural effusion. 3. Stable cardiomegaly. Electronically Signed   By: Ronney Asters M.D.   On: 06/30/2022 20:30   CT Head Wo Contrast  Result Date: 06/30/2022 CLINICAL DATA:  Progressive lethargy EXAM: CT HEAD WITHOUT CONTRAST TECHNIQUE: Contiguous axial images were obtained from the base of the skull through the vertex without intravenous contrast. RADIATION DOSE REDUCTION: This exam was performed according to the departmental dose-optimization program which includes automated exposure control, adjustment of the mA and/or kV according to patient size and/or use of iterative reconstruction technique. COMPARISON:  CT brain 05/13/2022 FINDINGS: Brain: No acute territorial infarction, hemorrhage, or intracranial mass. Patchy white matter hypodensity consistent with chronic small vessel ischemic change. The ventricles are nonenlarged. Vascular: No hyperdense vessels.  No unexpected calcification. Skull: Normal. Negative for fracture or focal lesion. Sinuses/Orbits: No acute finding. Other: None IMPRESSION: 1. No CT evidence for acute intracranial abnormality. 2. Mild chronic small vessel ischemic changes of the white matter. Electronically Signed   By: Donavan Foil M.D.   On: 06/30/2022 18:32   CT ABDOMEN PELVIS W CONTRAST  Result Date: 06/30/2022 CLINICAL DATA:  Left lower quadrant abdominal pain. EXAM: CT ABDOMEN AND PELVIS WITH CONTRAST TECHNIQUE: Multidetector CT imaging of the abdomen and pelvis was performed using the standard protocol following bolus administration of intravenous contrast. RADIATION DOSE REDUCTION: This exam was performed according to the departmental dose-optimization program which includes automated exposure control, adjustment of the mA and/or kV according to patient size and/or use of iterative reconstruction technique. CONTRAST:  27m OMNIPAQUE IOHEXOL 350 MG/ML SOLN COMPARISON:  None Available. FINDINGS: Lower chest: Small right pleural effusion is noted with adjacent subsegmental  atelectasis. Hepatobiliary: No focal liver abnormality is seen. No gallstones, gallbladder wall thickening, or biliary dilatation. Pancreas: Unremarkable. No pancreatic ductal dilatation or surrounding inflammatory changes. Spleen: Normal in size without focal abnormality. Adrenals/Urinary Tract: Adrenal glands are unremarkable. Kidneys are normal, without renal calculi, focal lesion, or hydronephrosis. Bladder is unremarkable. Stomach/Bowel: The stomach and appendix are unremarkable. There is no evidence of bowel obstruction or inflammation. A large amount of stool seen in the rectum concerning for impaction. Vascular/Lymphatic: Aortic atherosclerosis. No enlarged abdominal or pelvic lymph nodes. Reproductive: Status post hysterectomy. No adnexal masses. Other: No abdominal wall hernia or abnormality. No abdominopelvic ascites. Musculoskeletal: No acute or significant osseous findings. IMPRESSION: Small right pleural effusion is noted with adjacent subsegmental atelectasis. Large amount of stool seen in the rectum concerning for impaction. No other significant abnormality seen in the abdomen or pelvis. Aortic Atherosclerosis (ICD10-I70.0). Electronically Signed   By: JMarijo ConceptionM.D.   On: 06/30/2022 18:31   VAS [place]OWER EXTREMITY VENOUS (DVT)  Result Date: 06/22/2022  Lower Venous DVT Study Patient Name:  FEMALE MASIS  Date of Exam:   06/20/2022 Medical Rec #: [id number]          Accession #:    [id number] Date of Birth: November 08, 1940         Patient Gender: F Patient Age:   33 years Exam Location:  [facility] Procedure:      VAS [place] LOWER EXTREMITY VENOUS (DVT) Referring Phys: [facility] HARRIS --------------------------------------------------------------------------------  Indications: Pain.  Comparison Study: No prior right LEV on file Performing Technologist: Sharion Dove RVS  Examination Guidelines: A complete evaluation includes B-mode imaging, spectral Doppler, color Doppler, and power Doppler  as needed of all accessible portions of each vessel. Bilateral testing is considered an integral part of a complete examination. Limited examinations for reoccurring indications may be performed as noted. The reflux portion of the exam is performed with the patient in reverse Trendelenburg.  +--------+---------------+---------+-----------+----------------+-------------+ RIGHT   CompressibilityPhasicitySpontaneityProperties      Thrombus                                                                 Aging         +--------+---------------+---------+-----------+----------------+-------------+ CFV     Full                               pulsatile                                                                waveforms                     +--------+---------------+---------+-----------+----------------+-------------+ SFJ     Full                                                             +--------+---------------+---------+-----------+----------------+-------------+ FV Prox Full                                                             +--------+---------------+---------+-----------+----------------+-------------+ FV Mid  Full                                                             +--------+---------------+---------+-----------+----------------+-------------+ FV      Full  Distal                                                                   +--------+---------------+---------+-----------+----------------+-------------+ PFV     Full                                                             +--------+---------------+---------+-----------+----------------+-------------+ POP     Full                               pulsatile                                                                waveforms                      +--------+---------------+---------+-----------+----------------+-------------+ PTV     Full                                                             +--------+---------------+---------+-----------+----------------+-------------+ PERO    Full                                                             +--------+---------------+---------+-----------+----------------+-------------+   +----+---------------+---------+-----------+-------------------+--------------+ LEFTCompressibilityPhasicitySpontaneityProperties         Thrombus Aging +----+---------------+---------+-----------+-------------------+--------------+ CFV Full                               pulsatile waveforms               +----+---------------+---------+-----------+-------------------+--------------+    Summary: RIGHT: - There is no evidence of deep vein thrombosis in the lower extremity.  pulsatile waveforms  LEFT: Pulsatile waveforms.  *See table(s) above for measurements and observations. Electronically signed by Servando Snare MD on 06/22/2022 at 2:48:08 PM.    Final    DG Chest Port 1V same Day  Result Date: 06/20/2022 CLINICAL DATA:  Shortness of breath. EXAM: PORTABLE CHEST 1 VIEW COMPARISON:  06/18/2022 FINDINGS: Mild cardiac enlargement is unchanged. Persistent small bilateral pleural effusions. Pulmonary vascular congestion. Mild bibasilar atelectasis, unchanged. IMPRESSION: Unchanged mild CHF pattern with bibasilar atelectasis. Electronically Signed   By: Kerby Moors M.D.   On: 06/20/2022 10:03   ECHOCARDIOGRAM COMPLETE  Result Date: 06/19/2022    ECHOCARDIOGRAM REPORT   Patient Name:   AMALEE THWEATT Date of Exam: 06/19/2022 Medical Rec #:  [id number]         Height:       65.0 in Accession #:    [id number]        Weight:       217.0 lb Date of Birth:  1940-11-30        BSA:          2.048 m Patient Age:    25 years          BP:           117/69 mmHg Patient Gender: F                 HR:           81 bpm.  Exam Location:  Inpatient Procedure: 2D Echo and Intracardiac Opacification Agent Indications:    pulmonary embolus: r/o PHTN as cause for new onset hypoxia with                 ambulation and negative CT chest for lungs  History:        Patient has prior history of Echocardiogram examinations, most                 recent 09/29/2019. Pericarditis in 2021, Arrythmias:LBBB; Risk                 Factors:Hypertension.  Sonographer:    Harvie Junior Referring Phys: [phone] JARED M GARDNER  Sonographer Comments: Technically difficult study due to poor echo windows and patient is obese. Image acquisition challenging due to patient body habitus and Image acquisition challenging due to respiratory motion. IMPRESSIONS  1. Left ventricular ejection fraction, by estimation, is 50 to 55%. The left ventricle has low normal function. The left ventricle has no regional wall motion abnormalities. There is mild concentric left ventricular hypertrophy. Left ventricular diastolic parameters are consistent with Grade I diastolic dysfunction (impaired relaxation).  2. Right ventricular systolic function is normal. The right ventricular size is normal. There is normal pulmonary artery systolic pressure.  3. Mass along the mitral annulus. No evidence of mitral valve regurgitation.  4. The aortic valve was not well visualized. Aortic valve regurgitation is not visualized.  5. The inferior vena cava is normal in size with greater than 50% respiratory variability, suggesting right atrial pressure of 3 mmHg. 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 FINDINGS  Left Ventricle: Left ventricular ejection fraction, by estimation, is 50 to 55%. The left ventricle has low normal function. The left ventricle has no regional wall motion abnormalities. The left ventricular internal cavity size was normal in size. There is mild concentric left ventricular  hypertrophy. Left ventricular diastolic parameters are consistent with Grade I diastolic dysfunction (impaired relaxation). Right Ventricle: The right ventricular size is normal. Right ventricular systolic function is normal. There is normal pulmonary artery systolic pressure. The tricuspid regurgitant velocity is 2.48 m/s, and with an assumed right atrial pressure of 3 mmHg,  the estimated right ventricular systolic pressure is 0000000 mmHg. Left Atrium: Left atrial size was normal in size. Right Atrium: Right atrial size was normal in size. Pericardium: There is no evidence of pericardial effusion. Mitral Valve: Mass along the mitral annulus. No evidence of mitral valve regurgitation. Tricuspid Valve: Tricuspid valve regurgitation is mild. Aortic Valve: The aortic valve was not well visualized. Aortic valve regurgitation is not visualized. Aortic valve mean gradient measures 6.0 mmHg. Aortic valve peak gradient measures 10.0 mmHg. Aortic valve  area, by VTI measures 2.10 cm. Pulmonic Valve: Pulmonic valve regurgitation is not visualized. Venous: The inferior vena cava is normal in size with greater than 50% respiratory variability, suggesting right atrial pressure of 3 mmHg. IAS/Shunts: The interatrial septum was not well visualized.  LEFT VENTRICLE PLAX 2D LVIDd:         3.40 cm     Diastology LVIDs:         2.40 cm     LV e' medial:    5.66 cm/s LV PW:         1.10 cm     LV E/e' medial:  13.9 LV IVS:        1.10 cm     LV e' lateral:   5.22 cm/s LVOT diam:     1.80 cm     LV E/e' lateral: 15.1 LV SV:         53 LV SV Index:   26 LVOT Area:     2.54 cm  LV Volumes (MOD) LV vol d, MOD A2C: 52.8 ml LV vol d, MOD A4C: 68.2 ml LV vol s, MOD A2C: 29.1 ml LV vol s, MOD A4C: 40.8 ml LV SV MOD A2C:     23.7 ml LV SV MOD A4C:     68.2 ml LV SV MOD BP:      25.2 ml RIGHT VENTRICLE RV S prime:     17.30 cm/s TAPSE (M-mode): 1.8 cm LEFT ATRIUM             Index LA diam:        3.60 cm 1.76 cm/m LA Vol (A2C):   38.6 ml 18.85  ml/m LA Vol (A4C):   62.7 ml 30.62 ml/m LA Biplane Vol: 48.9 ml 23.88 ml/m  AORTIC VALVE                     PULMONIC VALVE AV Area (Vmax):    2.01 cm      PV Vmax:       0.89 m/s AV Area (Vmean):   1.79 cm      PV Peak grad:  3.2 mmHg AV Area (VTI):     2.10 cm AV Vmax:           158.00 cm/s AV Vmean:          112.000 cm/s AV VTI:            0.251 m AV Peak Grad:      10.0 mmHg AV Mean Grad:      6.0 mmHg LVOT Vmax:         125.00 cm/s LVOT Vmean:        78.900 cm/s LVOT VTI:          0.207 m LVOT/AV VTI ratio: 0.82  AORTA Ao Root diam: 2.80 cm Ao Asc diam:  2.80 cm MITRAL VALVE                TRICUSPID VALVE MV Area (PHT): 3.78 cm     TR Peak grad:   24.6 mmHg MV Decel Time: 201 msec     TR Vmax:        248.00 cm/s MV E velocity: 78.95 cm/s MV A velocity: 120.50 cm/s  SHUNTS MV E/A ratio:  0.66         Systemic VTI:  0.21 m  Systemic Diam: 1.80 cm Phineas Inches Electronically signed by Phineas Inches Signature Date/Time: 06/19/2022/11:09:53 AM    Final    CT Angio Chest PE W and/or Wo Contrast  Result Date: 06/18/2022 CLINICAL DATA:  Shortness of breath. EXAM: CT ANGIOGRAPHY CHEST WITH CONTRAST TECHNIQUE: Multidetector CT imaging of the chest was performed using the standard protocol during bolus administration of intravenous contrast. Multiplanar CT image reconstructions and MIPs were obtained to evaluate the vascular anatomy. RADIATION DOSE REDUCTION: This exam was performed according to the departmental dose-optimization program which includes automated exposure control, adjustment of the mA and/or kV according to patient size and/or use of iterative reconstruction technique. CONTRAST:  143m OMNIPAQUE IOHEXOL 350 MG/ML SOLN COMPARISON:  September 12, 2015. FINDINGS: Cardiovascular: Satisfactory opacification of the pulmonary arteries to the segmental level. No evidence of pulmonary embolism. Mild cardiomegaly. No pericardial effusion. Mediastinum/Nodes: No enlarged mediastinal, hilar,  or axillary lymph nodes. Thyroid gland, trachea, and esophagus demonstrate no significant findings. Lungs/Pleura: Minimal right pleural effusion is noted. No pneumothorax is noted. Minimal bibasilar subsegmental atelectasis is noted. Stable probable scarring is seen in right midlung related to prior lobectomy. Upper Abdomen: No acute abnormality. Musculoskeletal: No chest wall abnormality. No acute or significant osseous findings. Review of the MIP images confirms the above findings. IMPRESSION: No definite evidence of pulmonary embolus. Minimal right pleural effusion with minimal bibasilar subsegmental atelectasis. Electronically Signed   By: JMarijo ConceptionM.D.   On: 06/18/2022 13:05   DG Chest 2 View  Result Date: 06/18/2022 CLINICAL DATA:  SOB EXAM: CHEST - 2 VIEW COMPARISON:  May 12, 2022 FINDINGS: The cardiomediastinal silhouette is unchanged in contour. Trace bilateral pleural effusions. No pneumothorax. Background of diffuse interstitial prominence, similar in comparison to prior. There is more confluent opacity in the RIGHT lateral mid lung and upper lung which are new in comparison to prior. Visualized abdomen is unremarkable. Multilevel degenerative changes of the thoracic spine. IMPRESSION: New confluent opacities in the RIGHT lateral mid lung and upper lung. Differential considerations include infection or superimposed atelectasis on a background of pulmonary edema versus chronic interstitial lung disease. Recommend follow-up PA and lateral chest radiograph 4 weeks after appropriate treatment to assess for resolution. Electronically Signed   By: SValentino SaxonM.D.   On: 06/18/2022 08:38    Microbiology: Results for orders placed or performed during the hospital encounter of 06/30/22  Resp panel by RT-PCR (RSV, Flu A&B, Covid) Anterior Nasal Swab     Status: None   Collection Time: 06/30/22  1:31 PM   Specimen: Anterior Nasal Swab  Result Value Ref Range Status   SARS Coronavirus 2  by RT PCR NEGATIVE NEGATIVE Final   Influenza A by PCR NEGATIVE NEGATIVE Final   Influenza B by PCR NEGATIVE NEGATIVE Final    Comment: (NOTE) The Xpert Xpress SARS-CoV-2/FLU/RSV plus assay is intended as an aid in the diagnosis of influenza from Nasopharyngeal swab specimens and should not be used as a sole basis for treatment. Nasal washings and aspirates are unacceptable for Xpert Xpress SARS-CoV-2/FLU/RSV testing.  Fact Sheet for Patients: hEntrepreneurPulse.com.au Fact Sheet for Healthcare Providers: hIncredibleEmployment.be This test is not yet approved or cleared by the [place] and has been authorized for detection and/or diagnosis of SARS-CoV-2 by FDA under an Emergency Use Authorization (EUA). This EUA will remain in effect (meaning this test can be used) for the duration of the COVID-19 declaration under Section 564(b)(1) of the Act, 21 U.S.C. section 360bbb-3(b)(1), unless the authorization  is terminated or revoked.     Resp Syncytial Virus by PCR NEGATIVE NEGATIVE Final    Comment: (NOTE) Fact Sheet for Patients: EntrepreneurPulse.com.au  Fact Sheet for Healthcare Providers: IncredibleEmployment.be  This test is not yet approved or cleared by the [place] FDA and has been authorized for detection and/or diagnosis of SARS-CoV-2 by FDA under an Emergency Use Authorization (EUA). This EUA will remain in effect (meaning this test can be used) for the duration of the COVID-19 declaration under Section 564(b)(1) of the Act, 21 U.S.C. section 360bbb-3(b)(1), unless the authorization is terminated or revoked.  Performed at [facility], [place] [address] [place], [place] [address]   Culture, blood (routine x 2)     Status: None   Collection Time: 06/30/22  7:52 PM   Specimen: BLOOD  Result Value Ref Range Status   Specimen Description BLOOD LEFT ANTECUBITAL  Final   Special  Requests   Final    BOTTLES DRAWN AEROBIC AND ANAEROBIC Blood Culture adequate volume   Culture   Final    NO GROWTH 5 DAYS Performed at [facility], [place] [address] [place], [place] [address]    Report Status 07/05/2022 FINAL  Final  Culture, blood (routine x 2)     Status: None   Collection Time: 06/30/22  8:28 PM   Specimen: BLOOD LEFT HAND  Result Value Ref Range Status   Specimen Description BLOOD LEFT HAND  Final   Special Requests   Final    BOTTLES DRAWN AEROBIC AND ANAEROBIC Blood Culture adequate volume   Culture   Final    NO GROWTH 5 DAYS Performed at [facility], [place] [address], [place], [place] [address]    Report Status 07/05/2022 FINAL  Final  MRSA Next Gen by PCR, Nasal     Status: None   Collection Time: 06/30/22 11:21 PM  Result Value Ref Range Status   MRSA by PCR Next Gen NOT DETECTED NOT DETECTED Final    Comment: (NOTE) The GeneXpert MRSA Assay (FDA approved for NASAL specimens only), is one component of a comprehensive MRSA colonization surveillance program. It is not intended to diagnose MRSA infection nor to guide or monitor treatment for MRSA infections. Test performance is not FDA approved in patients less than 58 years old. Performed at [facility], [place] [address] [place], [place] [address]   CSF culture w Gram Stain     Status: None   Collection Time: 07/02/22  4:46 PM   Specimen: CSF; Cerebrospinal Fluid  Result Value Ref Range Status   Specimen Description CSF  Final   Special Requests LP  Final   Gram Stain NO WBC SEEN NO ORGANISMS SEEN CYTOSPIN SMEAR   Final   Culture   Final    NO GROWTH 3 DAYS Performed at [facility], [place] [address] [place], [place] [address]    Report Status 07/06/2022 FINAL  Final  Respiratory (~20 pathogens) panel by PCR     Status: None   Collection Time: 07/02/22  6:00 PM   Specimen: Nasopharyngeal Swab; Respiratory  Result Value Ref Range Status   Adenovirus NOT DETECTED NOT  DETECTED Final   Coronavirus 229E NOT DETECTED NOT DETECTED Final    Comment: (NOTE) The Coronavirus on the Respiratory Panel, DOES NOT test for the novel  Coronavirus (2019 nCoV)    Coronavirus HKU1 NOT DETECTED NOT DETECTED Final   Coronavirus NL63 NOT DETECTED NOT DETECTED Final  Coronavirus OC43 NOT DETECTED NOT DETECTED Final   Metapneumovirus NOT DETECTED NOT DETECTED Final   Rhinovirus / Enterovirus NOT DETECTED NOT DETECTED Final   Influenza A NOT DETECTED NOT DETECTED Final   Influenza B NOT DETECTED NOT DETECTED Final   Parainfluenza Virus 1 NOT DETECTED NOT DETECTED Final   Parainfluenza Virus 2 NOT DETECTED NOT DETECTED Final   Parainfluenza Virus 3 NOT DETECTED NOT DETECTED Final   Parainfluenza Virus 4 NOT DETECTED NOT DETECTED Final   Respiratory Syncytial Virus NOT DETECTED NOT DETECTED Final   Bordetella pertussis NOT DETECTED NOT DETECTED Final   Bordetella Parapertussis NOT DETECTED NOT DETECTED Final   Chlamydophila pneumoniae NOT DETECTED NOT DETECTED Final   Mycoplasma pneumoniae NOT DETECTED NOT DETECTED Final    Comment: Performed at [facility], [place] [address] [place], [place] [address]  Culture, blood (Routine X 2) w Reflex to ID Panel     Status: None (Preliminary result)   Collection Time: 07/12/22 10:45 AM   Specimen: BLOOD RIGHT HAND  Result Value Ref Range Status   Specimen Description BLOOD RIGHT HAND  Final   Special Requests   Final    BOTTLES DRAWN AEROBIC AND ANAEROBIC Blood Culture results may not be optimal due to an inadequate volume of blood received in culture bottles   Culture   Final    NO GROWTH 4 DAYS Performed at [facility], [place] [address] [place], [place] [address]    Report Status PENDING  Incomplete  Culture, blood (Routine X 2) w Reflex to ID Panel     Status: Abnormal   Collection Time: 07/12/22 10:54 AM   Specimen: BLOOD LEFT HAND  Result Value Ref Range Status   Specimen Description BLOOD LEFT HAND  Final    Special Requests   Final    BOTTLES DRAWN AEROBIC ONLY Blood Culture results may not be optimal due to an inadequate volume of blood received in culture bottles   Culture  Setup Time   Final    AEROBIC BOTTLE ONLY GRAM POSITIVE COCCI IN CLUSTERS CRITICAL RESULT CALLED TO, READ BACK BY AND VERIFIED WITH: PHARMD J. LEDFORD 07/13/22 @ 2209 BY AB    Culture (A)  Final    STAPHYLOCOCCUS SPECIES (COAGULASE NEGATIVE) THE SIGNIFICANCE OF ISOLATING THIS ORGANISM FROM A SINGLE SET OF BLOOD CULTURES WHEN MULTIPLE SETS ARE DRAWN IS UNCERTAIN. PLEASE NOTIFY THE MICROBIOLOGY DEPARTMENT WITHIN ONE WEEK IF SPECIATION AND SENSITIVITIES ARE REQUIRED. Performed at [facility], [place] [address] [place], [place] [address]    Report Status 07/16/2022 FINAL  Final  Blood Culture ID Panel (Reflexed)     Status: Abnormal   Collection Time: 07/12/22 10:54 AM  Result Value Ref Range Status   Enterococcus faecalis NOT DETECTED NOT DETECTED Final   Enterococcus Faecium NOT DETECTED NOT DETECTED Final   Listeria monocytogenes NOT DETECTED NOT DETECTED Final   Staphylococcus species DETECTED (A) NOT DETECTED Final    Comment: CRITICAL RESULT CALLED TO, READ BACK BY AND VERIFIED WITH: PHARMD J. LEDFORD 07/13/22 @ 2209 BY AB    Staphylococcus aureus (BCID) NOT DETECTED NOT DETECTED Final   Staphylococcus epidermidis NOT DETECTED NOT DETECTED Final   Staphylococcus lugdunensis NOT DETECTED NOT DETECTED Final   Streptococcus species NOT DETECTED NOT DETECTED Final   Streptococcus agalactiae NOT DETECTED NOT DETECTED Final   Streptococcus pneumoniae NOT DETECTED NOT DETECTED Final   Streptococcus pyogenes NOT DETECTED NOT DETECTED Final   A.calcoaceticus-baumannii NOT DETECTED NOT  DETECTED Final   Bacteroides fragilis NOT DETECTED NOT DETECTED Final   Enterobacterales NOT DETECTED NOT DETECTED Final   Enterobacter cloacae complex NOT DETECTED NOT DETECTED Final   Escherichia coli NOT DETECTED NOT DETECTED Final    Klebsiella aerogenes NOT DETECTED NOT DETECTED Final   Klebsiella oxytoca NOT DETECTED NOT DETECTED Final   Klebsiella pneumoniae NOT DETECTED NOT DETECTED Final   Proteus species NOT DETECTED NOT DETECTED Final   Salmonella species NOT DETECTED NOT DETECTED Final   Serratia marcescens NOT DETECTED NOT DETECTED Final   Haemophilus influenzae NOT DETECTED NOT DETECTED Final   Neisseria meningitidis NOT DETECTED NOT DETECTED Final   Pseudomonas aeruginosa NOT DETECTED NOT DETECTED Final   Stenotrophomonas maltophilia NOT DETECTED NOT DETECTED Final   Candida albicans NOT DETECTED NOT DETECTED Final   Candida auris NOT DETECTED NOT DETECTED Final   Candida glabrata NOT DETECTED NOT DETECTED Final   Candida krusei NOT DETECTED NOT DETECTED Final   Candida parapsilosis NOT DETECTED NOT DETECTED Final   Candida tropicalis NOT DETECTED NOT DETECTED Final   Cryptococcus neoformans/gattii NOT DETECTED NOT DETECTED Final    Comment: Performed at [facility], [place] [address], [place], [place] [address]    Labs: CBC: Recent Labs  Lab 07/11/22 0246 07/12/22 0303 07/13/22 0410 07/14/22 0333 07/15/22 0321  WBC 12.4* 13.8* 10.3 8.9 8.5  HGB 12.2 11.1* 11.3* 11.2* 10.9*  HCT 37.4 32.8* 34.5* 33.6* 32.4*  MCV 84.2 82.8 84.4 82.8 83.3  PLT 72* 70* 80* 90* [id number]*   Basic Metabolic Panel: Recent Labs  Lab 07/10/22 0556 07/10/22 0611 07/11/22 0246 07/12/22 0303 07/13/22 0410 07/14/22 0333 07/15/22 0321  NA  --    < > 136 134* 133* 130* 131*  K  --    < > 3.3* 3.1* 3.6 3.0* 3.9  CL  --    < > 107 105 103 102 106  CO2  --    < > 24 22 19* 20* 19*  GLUCOSE  --    < > 125* 116* 110* 83 100*  BUN  --    < > 8 6* 6* 7* 6*  CREATININE  --    < > 0.86 0.88 0.79 0.83 0.78  CALCIUM  --    < > 8.1* 7.9* 8.1* 8.0* 8.1*  PHOS 2.5  --   --   --   --   --   --    < > = values in this interval not displayed.    CBG: Recent Labs  Lab 07/15/22 0840 07/15/22 1213 07/15/22 1719  07/15/22 2111 07/16/22 0859  GLUCAP 95 111* 101* 104* 92    Discharge time spent: 35 minutes.  Signed: Cordelia Poche, MD [facility] 07/16/2022

## 2022-07-16 NOTE — TOC Transition Note (Signed)
Transition of Care [facility] - CM/SW Discharge Note   Patient Details  Name: Barbara Nelson MRN: [id number] Date of Birth: 1940-08-19  [facility]) CM/SW Contact:  Barbara Carmine, RN Phone Number: 07/16/2022, 12:36 PM   Clinical Narrative:     The patient will be discharging today. All equipment was delivered per [place] from [facility]. Spoke to patients daughter to confirm equipment delivery. She states the equipment is there. Messaged [facility] to let them know the patient is going home today. Her daughter requests [facility] to take the patient home. She also requests teaching of foley catheter.  Team aware of all this, RN will let this RNCM know when to call for [facility]  Final next level of care: Home w Home Health Services Barriers to Discharge: No Barriers Identified   Patient Goals and CMS Choice CMS Medicare.gov Compare Post Acute Care list provided to:: Patient Represenative (must comment) Choice offered to / list presented to : Adult Children (daughter Barbara Nelson)  Discharge Placement                         Discharge Plan and Services Additional resources added to the After Visit Summary for   In-house Referral: Clinical Social Work Discharge Planning Services: CM Consult            DME Arranged: 3-N-1, Hospital bed (hoyer) DME Agency: [facility] Date DME Agency Contacted: 07/14/22 Time DME Agency Contacted: [phone] Representative spoke with at DME Agency: [facility] HH Arranged: RN, PT, OT, Nurse's Aide, Speech Therapy HH Agency: [place] Date [place]: 07/14/22 Time [place]: [id number] Representative spoke with at [place]: [place] Determinants of Health ([place]) Interventions SDOH Screenings   Food Insecurity: No Food Insecurity (07/01/2022)  Housing: Low Risk  (07/01/2022)  Transportation Needs: No Transportation Needs (07/01/2022)  Utilities: Not At Risk (07/01/2022)  Tobacco Use: Medium Risk  (07/01/2022)     Readmission Risk Interventions     No data to display

## 2022-07-17 ENCOUNTER — Encounter: Payer: Medicare Other | Attending: Family Medicine | Admitting: Dietician

## 2022-07-17 DIAGNOSIS — R7303 Prediabetes: Secondary | ICD-10-CM | POA: Insufficient documentation

## 2022-07-17 LAB — CULTURE, BLOOD (ROUTINE X 2): Culture: NO GROWTH

## 2022-07-24 LAB — GLUCOSE, CAPILLARY
Glucose-Capillary: 156 mg/dL — ABNORMAL HIGH (ref 70–99)
Glucose-Capillary: 143 mg/dL — ABNORMAL HIGH (ref 70–99)
Glucose-Capillary: 109 mg/dL — ABNORMAL HIGH (ref 70–99)
Glucose-Capillary: 91 mg/dL (ref 70–99)
Glucose-Capillary: 115 mg/dL — ABNORMAL HIGH (ref 70–99)
Glucose-Capillary: 144 mg/dL — ABNORMAL HIGH (ref 70–99)

## 2022-07-27 ENCOUNTER — Encounter: Payer: Self-pay | Admitting: Urology

## 2022-07-27 ENCOUNTER — Ambulatory Visit (INDEPENDENT_AMBULATORY_CARE_PROVIDER_SITE_OTHER): Payer: Medicare Other | Admitting: Urology

## 2022-07-27 VITALS — BP 128/70 | HR 80

## 2022-07-27 DIAGNOSIS — R339 Retention of urine, unspecified: Secondary | ICD-10-CM | POA: Insufficient documentation

## 2022-07-27 NOTE — Progress Notes (Signed)
Assessment: 1. Urinary retention     Plan: I personally reviewed the patient's chart including provider notes, and lab results. Foley removed today Cipro x 1 following catheter removal Patient has family member who is a NP and can replace foley if needed.  Supplies provided. Return to office in 10-14 days with bladder scan  Chief Complaint:  Chief Complaint  Patient presents with   Urinary Retention    History of Present Illness:  Barbara Nelson is a 82 y.o. female who is seen in consultation from Cari Caraway, MD for evaluation of urinary retention. She was recently hospitalized with acute metabolic encephalopathy.  She was found to have urinary retention during her hospitalization.  She had a Foley catheter placed.  She was experiencing significant issues with constipation requiring fecal disimpaction and aggressive bowel regimen during her hospitalization.  The Foley catheter was placed on 07/13/2022.  She failed a voiding trial on 07/16/2022 and the catheter was replaced.  She denied any difficulty with urination prior to her hospitalization.  No history of UTIs or kidney stones.  No dysuria or gross hematuria.   Catheter has been draining well.  No gross hematuria.  She is not currently on any antibiotics. She is having fairly regular bowel movements at the present time.  Past Medical History:  Past Medical History:  Diagnosis Date   Benign essential HTN 10/31/2015   Cardiac tamponade due to viral pericarditis    LBBB (left bundle branch block)    Lung mass    fungal infection s/p [facility] at [facility]   Postoperative atrial fibrillation [facility])    Pulmonary nodule     Past Surgical History:  Past Surgical History:  Procedure Laterality Date   ABDOMINAL HYSTERECTOMY  1988   chest tube placement  09/03/11   LUNG REMOVAL, PARTIAL     right knee arthoscopy with meniscal repair     subxiphoid pericardial window and drainage anterior partial pericardiectomy  09/03/11     Allergies:  Allergies  Allergen Reactions   Latex Rash   Penicillins Rash and Itching   Tape Hives and Rash   Z-Pak [Azithromycin] Rash and Other (See Comments)    Mouth sores   Egg-Derived Products Nausea And Vomiting   Amoxil [Amoxicillin] Rash   Sulfa Antibiotics Rash    Family History:  Family History  Problem Relation Age of Onset   Hypertension Father    Stroke Father    Hypertension Mother    Ovarian cancer Mother    Prostate cancer Brother    Thyroid disease Sister    Hypertension Sister    Leukemia Maternal Aunt     Social History:  Social History   Tobacco Use   Smoking status: Former    Packs/day: 1.50    Years: 10.00    Total pack years: 15.00    Types: Cigarettes    Quit date: 05/19/1979    Years since quitting: 43.2   Smokeless tobacco: Never  Vaping Use   Vaping Use: Never used  Substance Use Topics   Alcohol use: No   Drug use: No    Review of symptoms:  Constitutional:  Negative for unexplained weight loss, night sweats, fever, chills ENT:  Negative for nose bleeds, sinus pain, painful swallowing CV:  Negative for chest pain, shortness of breath, exercise intolerance, palpitations, loss of consciousness Resp:  Negative for cough, wheezing, shortness of breath GI:  Negative for nausea, vomiting, diarrhea, bloody stools GU:  Positives noted in HPI; otherwise negative for  gross hematuria, dysuria, urinary incontinence Neuro:  Negative for seizures, poor balance, limb weakness, slurred speech Psych:  Negative for lack of energy, depression, anxiety Endocrine:  Negative for polydipsia, polyuria, symptoms of hypoglycemia (dizziness, hunger, sweating) Hematologic:  Negative for anemia, purpura, petechia, prolonged or excessive bleeding, use of anticoagulants  Allergic:  Negative for difficulty breathing or choking as a result of exposure to anything; no shellfish allergy; no allergic response (rash/itch) to materials, foods  Physical exam: BP  128/70   Pulse 80   LMP  (LMP Unknown)  GENERAL APPEARANCE:  Well appearing, well developed, well nourished, NAD HEENT: Atraumatic, Normocephalic, oropharynx clear. NECK: Supple without lymphadenopathy or thyromegaly. LUNGS: Clear to auscultation bilaterally. HEART: Regular Rate and Rhythm without murmurs, gallops, or rubs. ABDOMEN: Soft, non-tender, No Masses. EXTREMITIES: Moves all extremities well.  Without clubbing, cyanosis, or edema. NEUROLOGIC:  Alert and oriented x 3, wheelchair, CN II-XII grossly intact.  MENTAL STATUS:  Appropriate. BACK:  Non-tender to palpation.  No CVAT SKIN:  Warm, dry and intact.    Results: None

## 2022-07-27 NOTE — Progress Notes (Signed)
Catheter Removal  Patient is present today for a catheter removal.  78m of water was drained from the balloon. A 14FR foley cath was removed from the bladder, no complications were noted. Patient tolerated well.  Performed by: CBradly BienenstockCMA

## 2022-07-31 ENCOUNTER — Encounter: Payer: Medicare Other | Admitting: Dietician

## 2022-08-10 ENCOUNTER — Ambulatory Visit (INDEPENDENT_AMBULATORY_CARE_PROVIDER_SITE_OTHER): Payer: Medicare Other | Admitting: Urology

## 2022-08-10 ENCOUNTER — Encounter: Payer: Self-pay | Admitting: Urology

## 2022-08-10 VITALS — BP 135/87 | HR 86 | Ht 66.0 in | Wt 216.0 lb

## 2022-08-10 DIAGNOSIS — R339 Retention of urine, unspecified: Secondary | ICD-10-CM

## 2022-08-10 DIAGNOSIS — Z87898 Personal history of other specified conditions: Secondary | ICD-10-CM

## 2022-08-10 DIAGNOSIS — R8290 Unspecified abnormal findings in urine: Secondary | ICD-10-CM

## 2022-08-10 LAB — MICROSCOPIC EXAMINATION
Cast Type: NONE SEEN
Casts: NONE SEEN /lpf
Crystal Type: NONE SEEN
Crystals: NONE SEEN
Epithelial Cells (non renal): >10 /hpf — AB (ref 0–10)
Mucus, UA: NONE SEEN
RBC, Urine: NONE SEEN /hpf (ref 0–2)
Trichomonas, UA: NONE SEEN
WBC, UA: >30 /hpf — AB (ref 0–5)
Yeast, UA: NONE SEEN

## 2022-08-10 LAB — URINALYSIS, ROUTINE W REFLEX MICROSCOPIC
Bilirubin, UA: NEGATIVE
Glucose, UA: NEGATIVE
Ketones, UA: NEGATIVE
Nitrite, UA: POSITIVE — AB
RBC, UA: NEGATIVE
Specific Gravity, UA: 1.02 (ref 1.005–1.030)
Urobilinogen, Ur: 0.2 mg/dL (ref 0.2–1.0)
pH, UA: 7 (ref 5.0–7.5)

## 2022-08-10 LAB — BLADDER SCAN AMB NON-IMAGING

## 2022-08-10 NOTE — Progress Notes (Signed)
Assessment: 1. History of urinary retention     Plan: Patient will obtain urine specimen at home to bring to office for evaluation Will call with results Return to office prn  Chief Complaint:  Chief Complaint  Patient presents with   Urinary Retention    History of Present Illness:  Barbara Nelson is a 82 y.o. female who is seen for further evaluation of urinary retention. She was recently hospitalized with acute metabolic encephalopathy.  She was found to have urinary retention during her hospitalization and had a Foley catheter placed.  She was experiencing significant issues with constipation requiring fecal disimpaction and aggressive bowel regimen during her hospitalization.  The Foley catheter was placed on 07/13/2022.  She failed a voiding trial on 07/16/2022 and the catheter was replaced.  She denied any difficulty with urination prior to her hospitalization.  No history of UTIs or kidney stones.  No dysuria or gross hematuria.   Catheter has been draining well.  No gross hematuria.  She is not currently on any antibiotics. She has been having fairly regular bowel movements since discharge from the hospital. Her foley was removed on 07/27/22.  She has been voiding spontaneously since the catheter was removed.  She reports some dysuria for the past several days.  No gross hematuria or flank pain.  Her bowels are moving well.  Portions of the above documentation were copied from a prior visit for review purposes only.   Past Medical History:  Past Medical History:  Diagnosis Date   Benign essential HTN 10/31/2015   Cardiac tamponade due to viral pericarditis    LBBB (left bundle branch block)    Lung mass    fungal infection s/p resection at [facility]   Postoperative atrial fibrillation [facility])    Pulmonary nodule     Past Surgical History:  Past Surgical History:  Procedure Laterality Date   ABDOMINAL HYSTERECTOMY  1988   chest tube placement  09/03/11   LUNG REMOVAL,  PARTIAL     right knee arthoscopy with meniscal repair     subxiphoid pericardial window and drainage anterior partial pericardiectomy  09/03/11    Allergies:  Allergies  Allergen Reactions   Latex Rash   Penicillins Rash and Itching   Tape Hives and Rash   Z-Pak [Azithromycin] Rash and Other (See Comments)    Mouth sores   Egg-Derived Products Nausea And Vomiting   Amoxil [Amoxicillin] Rash   Sulfa Antibiotics Rash    Family History:  Family History  Problem Relation Age of Onset   Hypertension Father    Stroke Father    Hypertension Mother    Ovarian cancer Mother    Prostate cancer Brother    Thyroid disease Sister    Hypertension Sister    Leukemia Maternal Aunt     Social History:  Social History   Tobacco Use   Smoking status: Former    Packs/day: 1.50    Years: 10.00    Additional pack years: 0.00    Total pack years: 15.00    Types: Cigarettes    Quit date: 05/19/1979    Years since quitting: 43.2   Smokeless tobacco: Never  Vaping Use   Vaping Use: Never used  Substance Use Topics   Alcohol use: No   Drug use: No    ROS: Constitutional:  Negative for fever, chills, weight loss CV: Negative for chest pain, previous MI, hypertension Respiratory:  Negative for shortness of breath, wheezing, sleep apnea, frequent cough GI:  Negative for nausea, vomiting, bloody stool, GERD  Physical exam: BP 135/87   Pulse 86   Ht 5\' 6"  (1.676 m)   Wt 216 lb (98 kg)   LMP  (LMP Unknown)   BMI 34.86 kg/m  GENERAL APPEARANCE:  Well appearing, well developed, well nourished, NAD HEENT:  Atraumatic, normocephalic, oropharynx clear NECK:  Supple without lymphadenopathy or thyromegaly ABDOMEN:  Soft, non-tender, no masses EXTREMITIES:  Without clubbing, cyanosis, or edema NEUROLOGIC:  Alert and oriented x 3, in wheelchair, CN II-XII grossly intact MENTAL STATUS:  appropriate BACK:  Non-tender to palpation, No CVAT SKIN:  Warm, dry, and intact  Results: No  specimen obtained.  PVR:   0 ml

## 2022-08-10 NOTE — Addendum Note (Signed)
Addended by: Evelina Bucy on: 08/10/2022 01:32 PM   Modules accepted: Orders

## 2022-08-10 NOTE — Addendum Note (Signed)
Addended by: Dema Severin on: 08/10/2022 10:11 AM   Modules accepted: Orders

## 2022-08-13 LAB — URINE CULTURE

## 2022-08-13 MED ORDER — NITROFURANTOIN MONOHYD MACRO 100 MG PO CAPS: 100.0000 mg | ORAL_CAPSULE | Freq: Two times a day (BID) | ORAL | 0 refills | Status: DC

## 2022-08-13 NOTE — Addendum Note (Signed)
Addended by: Primus Bravo on: 08/13/2022 11:17 AM   Modules accepted: Orders

## 2022-08-19 ENCOUNTER — Ambulatory Visit (INDEPENDENT_AMBULATORY_CARE_PROVIDER_SITE_OTHER): Payer: Medicare Other

## 2022-08-19 ENCOUNTER — Encounter: Payer: Self-pay | Admitting: Nurse Practitioner

## 2022-08-19 ENCOUNTER — Ambulatory Visit: Payer: Medicare Other | Attending: Nurse Practitioner | Admitting: Nurse Practitioner

## 2022-08-19 VITALS — BP 128/86 | HR 87 | Ht 66.0 in | Wt 203.0 lb

## 2022-08-19 DIAGNOSIS — R002 Palpitations: Secondary | ICD-10-CM | POA: Diagnosis not present

## 2022-08-19 DIAGNOSIS — I4891 Unspecified atrial fibrillation: Secondary | ICD-10-CM | POA: Insufficient documentation

## 2022-08-19 DIAGNOSIS — I5032 Chronic diastolic (congestive) heart failure: Secondary | ICD-10-CM | POA: Diagnosis present

## 2022-08-19 DIAGNOSIS — I9789 Other postprocedural complications and disorders of the circulatory system, not elsewhere classified: Secondary | ICD-10-CM | POA: Diagnosis not present

## 2022-08-19 DIAGNOSIS — R000 Tachycardia, unspecified: Secondary | ICD-10-CM | POA: Diagnosis not present

## 2022-08-19 DIAGNOSIS — R42 Dizziness and giddiness: Secondary | ICD-10-CM

## 2022-08-19 NOTE — Patient Instructions (Signed)
Medication Instructions:  Your physician recommends that you continue on your current medications as directed. Please refer to the Current Medication list given to you today.   *If you need a refill on your cardiac medications before your next appointment, please call your pharmacy*   Lab Work: NONE ordered at this time of appointment   If you have labs (blood work) drawn today and your tests are completely normal, you will receive your results only by: [place] (if you have MyChart) OR A paper copy in the mail If you have any lab test that is abnormal or we need to change your treatment, we will call you to review the results.   Testing/Procedures: Your physician has requested that you have an echocardiogram. Echocardiography is a painless test that uses sound waves to create images of your heart. It provides your doctor with information about the size and shape of your heart and how well your heart's chambers and valves are working. This procedure takes approximately one hour. There are no restrictions for this procedure. Please do NOT wear cologne, perfume, aftershave, or lotions (deodorant is allowed). Please arrive 15 minutes prior to your appointment time.  ZIO XT- Long Term Monitor Instructions  Your physician has requested you wear a ZIO patch monitor for 14 days.  This is a single patch monitor. [employer] supplies one patch monitor per enrollment. Additional stickers are not available. Please do not apply patch if you will be having a Nuclear Stress Test,  Echocardiogram, Cardiac CT, MRI, or Chest Xray during the period you would be wearing the  monitor. The patch cannot be worn during these tests. You cannot remove and re-apply the  ZIO XT patch monitor.  Your ZIO patch monitor will be mailed 3 day USPS to your address on file. It may take 3-5 days  to receive your monitor after you have been enrolled.  Once you have received your monitor, please review the enclosed  instructions. Your monitor  has already been registered assigning a specific monitor serial # to you.  Billing and Patient Assistance Program Information  We have supplied [employer] with any of your insurance information on file for billing purposes. [employer] offers a sliding scale Patient Assistance Program for patients that do not have  insurance, or whose insurance does not completely cover the cost of the ZIO monitor.  You must apply for the Patient Assistance Program to qualify for this discounted rate.  To apply, please call [employer] at [phone], select option 4, select option 2, ask to apply for  Patient Assistance Program. Theodore Demark will ask your household income, and how many people  are in your household. They will quote your out-of-pocket cost based on that information.  [employer] will also be able to set up a 76-month, interest-free payment plan if needed.  Applying the monitor   Shave hair from upper left chest.  Hold abrader disc by orange tab. Rub abrader in 40 strokes over the upper left chest as  indicated in your monitor instructions.  Clean area with 4 enclosed alcohol pads. Let dry.  Apply patch as indicated in monitor instructions. Patch will be placed under collarbone on left  side of chest with arrow pointing upward.  Rub patch adhesive wings for 2 minutes. Remove white label marked "1". Remove the white  label marked "2". Rub patch adhesive wings for 2 additional minutes.  While looking in a mirror, press and release button in center of patch. A small green light will  flash  3-4 times. This will be your only indicator that the monitor has been turned on.  Do not shower for the first 24 hours. You may shower after the first 24 hours.  Press the button if you feel a symptom. You will hear a small click. Record Date, Time and  Symptom in the Patient Logbook.  When you are ready to remove the patch, follow instructions on the last 2 pages of Patient  Logbook. Stick patch  monitor onto the last page of Patient Logbook.  Place Patient Logbook in the blue and white box. Use locking tab on box and tape box closed  securely. The blue and white box has prepaid postage on it. Please place it in the mailbox as  soon as possible. Your physician should have your test results approximately 7 days after the  monitor has been mailed back to [facility].  Call [facility] at [phone] if you have questions regarding  your ZIO XT patch monitor. Call them immediately if you see an orange light blinking on your  monitor.  If your monitor falls off in less than 4 days, contact our Monitor department at [phone].  If your monitor becomes loose or falls off after 4 days call [employer] at [phone] for  suggestions on securing your monitor     Follow-Up: At [facility], you and your health needs are our priority.  As part of our continuing mission to provide you with exceptional heart care, we have created designated Provider Care Teams.  These Care Teams include your primary Cardiologist (physician) and Advanced Practice Providers (APPs -  Physician Assistants and Nurse Practitioners) who all work together to provide you with the care you need, when you need it.  We recommend signing up for the patient portal called "MyChart".  Sign up information is provided on this After Visit Summary.  MyChart is used to connect with patients for Virtual Visits (Telemedicine).  Patients are able to view lab/test results, encounter notes, upcoming appointments, etc.  Non-urgent messages can be sent to your provider as well.   To learn more about what you can do with MyChart, go to [email or web address].    Your next appointment:    Keep follow up   Provider:   Fransico Him, MD     Other Instructions

## 2022-08-19 NOTE — Progress Notes (Signed)
Office Visit    Patient Name: Barbara Nelson Date of Encounter: 08/19/2022  Primary Care Provider:  Cari Caraway, MD Primary Cardiologist:  Fransico Him, MD  Chief Complaint    82 year old female with a history of viral pericarditis with tamponade s/p pericardial window and anterior partial pericardiectomy in 2013, postop atrial fibrillation, pulmonary blastomycosis s/p right upper lobectomy, chronic LBBB, sinus tachycardia, and hypertension who presents for hospital follow-up related to sinus tachycardia.  Past Medical History    Past Medical History:  Diagnosis Date   Benign essential HTN 10/31/2015   Cardiac tamponade due to viral pericarditis    LBBB (left bundle branch block)    Lung mass    fungal infection s/p resection at [facility]   Postoperative atrial fibrillation    Pulmonary nodule    Past Surgical History:  Procedure Laterality Date   ABDOMINAL HYSTERECTOMY  1988   chest tube placement  09/03/11   LUNG REMOVAL, PARTIAL     right knee arthoscopy with meniscal repair     subxiphoid pericardial window and drainage anterior partial pericardiectomy  09/03/11    Allergies  Allergies  Allergen Reactions   Latex Rash   Penicillins Rash and Itching   Tape Hives and Rash   Z-Pak [Azithromycin] Rash and Other (See Comments)    Mouth sores   Egg-Derived Products Nausea And Vomiting   Potassium Nausea And Vomiting   Amoxil [Amoxicillin] Rash   Sulfa Antibiotics Rash     Labs/Other Studies Reviewed    The following studies were reviewed today: TTE 2022/06/30: IMPRESSIONS     1. Left ventricular ejection fraction, by estimation, is 50 to 55%. The  left ventricle has low normal function. The left ventricle has no regional  wall motion abnormalities. There is mild concentric left ventricular  hypertrophy. Left ventricular  diastolic parameters are consistent with Grade I diastolic dysfunction  (impaired relaxation).   2. Right ventricular systolic function is  normal. The right ventricular  size is normal. There is normal pulmonary artery systolic pressure.   3. Mass along the mitral annulus. No evidence of mitral valve  regurgitation.   4. The aortic valve was not well visualized. Aortic valve regurgitation  is not visualized.   5. The inferior vena cava is normal in size with greater than 50%  respiratory variability, suggesting right atrial pressure of 3 mmHg.   Conclusion(s)/Recommendation(s): There is a mass along the mitral  annulus/windows are challenging, consider TEE if patient can tolerate.     Recent Labs: 06/30/2022: B Natriuretic Peptide 28.2; TSH 1.727 07/08/2022: ALT 45; Magnesium 1.9 07/15/2022: BUN 6; Creatinine, Ser 0.78; Hemoglobin 10.9; Platelets 101; Potassium 3.9; Sodium 131  Recent Lipid Panel No results found for: "CHOL", "TRIG", "HDL", "CHOLHDL", "VLDL", "LDLCALC", "LDLDIRECT"  History of Present Illness    82 year old female with the above past medical history including viral pericarditis with tamponade s/p pericardial window and anterior partial pericardiectomy in 2013, postop atrial fibrillation, pulmonary blastomycosis s/p right upper lobectomy, chronic LBBB, sinus tachycardia, and hypertension.  Cardiac monitor in 2017 in the setting of recurrent palpitations showed sinus rhythm.  She was hospitalized in June 30, 2022 with shortness of breath, dyspnea on exertion, found to have suspected pneumonia with mild volume overload.  CTA showed no evidence of PE.  Echocardiogram in 06/30/2022 showed EF 50 to 55%, no RWMA, G1 DD, normal RV size and function, questionable enlarged coronary sinus and possible mitral valve mass (likely just tissue coming in and out of plane). She  was diuresed and treated with antibiotics with improvement and discharged home. She returned to the ED on 06/30/2020 for worsening mental status, vomiting and was found to have new infiltrate on chest x-ray.  HR was elevated in the 100s.  Family requested  cardiology evaluation.  It was felt that her sinus tachycardia was likely due to underlying pneumonia, dehydration. SNF placement was recommended due to generalized weakness, overall physical deconditioning.  However, family declined SNF placement and she was discharged home in stable condition on 07/16/2022.  She presents today for follow-up accompanied by her daughter.  Since her hospitalization she has been stable overall cardiac standpoint.  She does note some fleeting intermittent palpitations, intermittent lightheadedness/dizziness.  She has stable nonpitting bilateral lower extremity edema, left greater than right.  She denies dyspnea, PND, orthopnea, weight gain.  Denies symptoms concerning for angina.  Overall, she reports feeling well.    Home Medications    Current Outpatient Medications  Medication Sig Dispense Refill   furosemide (LASIX) 20 MG tablet Take 1 tablet (20 mg total) by mouth daily. 30 tablet 0   metoprolol succinate (TOPROL-XL) 25 MG 24 hr tablet Take 0.5 tablets (12.5 mg total) by mouth daily. 90 tablet 1   Multiple Vitamin (MULTIVITAMIN WITH MINERALS) TABS tablet Take 1 tablet by mouth daily.     nitrofurantoin, macrocrystal-monohydrate, (MACROBID) 100 MG capsule Take 1 capsule (100 mg total) by mouth every 12 (twelve) hours. 14 capsule 0   polyethylene glycol powder (MIRALAX) 17 GM/SCOOP powder Take 17 g by mouth 2 (two) times daily. Decrease to once daily if having watery stools. 510 g 0   No current facility-administered medications for this visit.     Review of Systems    She denies chest pain, dyspnea, pnd, orthopnea, n, v, dizziness, syncope, weight gain, or early satiety. All other systems reviewed and are otherwise negative except as noted above.   Physical Exam    VS:  BP 128/86 (BP Location: Right Arm, Patient Position: Sitting, Cuff Size: Large)   Pulse 87   Ht 5\' 6"  (1.676 m)   Wt 203 lb (92.1 kg)   LMP  (LMP Unknown)   SpO2 97%   BMI 32.77 kg/m    GEN: Well nourished, well developed, in no acute distress. HEENT: normal. Neck: Supple, no JVD, carotid bruits, or masses. Cardiac: RRR, no murmurs, rubs, or gallops. No clubbing, cyanosis, nonpitting bilateral lower extremity edema, L>R.  Radials/DP/PT 2+ and equal bilaterally.  Respiratory:  Respirations regular and unlabored, clear to auscultation bilaterally. GI: Soft, nontender, nondistended, BS + x 4. MS: no deformity or atrophy. Skin: warm and dry, no rash. Neuro:  Strength and sensation are intact. Psych: Normal affect.  Accessory Clinical Findings    ECG personally reviewed by me today - No EKG in office today.     Lab Results  Component Value Date   WBC 8.5 07/15/2022   HGB 10.9 (L) 07/15/2022   HCT 32.4 (L) 07/15/2022   MCV 83.3 07/15/2022   PLT 101 (L) 07/15/2022   Lab Results  Component Value Date   CREATININE 0.78 07/15/2022   BUN 6 (L) 07/15/2022   NA 131 (L) 07/15/2022   K 3.9 07/15/2022   CL 106 07/15/2022   CO2 19 (L) 07/15/2022   Lab Results  Component Value Date   ALT 45 (H) 07/08/2022   AST 57 (H) 07/08/2022   ALKPHOS 71 07/08/2022   BILITOT 0.8 07/08/2022   No results found for: "CHOL", "HDL", "LDLCALC", "  LDLDIRECT", "TRIG", "CHOLHDL"  Lab Results  Component Value Date   HGBA1C 6.9 (H) 06/19/2022    Assessment & Plan    1. Sinus tachycardia: Sinus tachycardia noted on telemetry during recent hospitalization, however, this did occur in the setting of pneumonia and possible dehydration.  She notes intermittent fleeting palpitations, intermittent lightheadedness, dizziness.  Given history of postoperative fibrillation as below, recent palpitations, will check 14-day ZIO.  Continue metoprolol.   2. 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  Repeat echocardiogram was  recommended as an outpatient.  Will repeat echo.   3. Chronic diastolic heart failure: Most recent echo as above.  She has stable nonpitting bilateral lower extremity edema, L >R. Otherwise, euvolemic and well compensated on exam.  Continue metoprolol, Lasix.  4. Postop atrial fibrillation: Intermittent fleeting palpitations. She is not anticoagulation.Will check 14-day ZIO as above.   5. Disposition: Follow-up as scheduled with Dr. Radford Pax in 12/2022, sooner if needed.      Lenna Sciara, NP 08/19/2022, 9:50 PM

## 2022-08-19 NOTE — Progress Notes (Unsigned)
Enrolled for [employer] to mail a ZIO XT long term holter monitor to the patients address on file.   Dr. Turner to read. 

## 2022-08-21 ENCOUNTER — Encounter: Payer: Medicare Other | Admitting: Dietician

## 2022-08-22 DIAGNOSIS — R42 Dizziness and giddiness: Secondary | ICD-10-CM | POA: Diagnosis not present

## 2022-08-22 DIAGNOSIS — R002 Palpitations: Secondary | ICD-10-CM | POA: Diagnosis not present

## 2022-09-11 ENCOUNTER — Encounter: Payer: Medicare Other | Admitting: Dietician

## 2022-09-17 ENCOUNTER — Ambulatory Visit (HOSPITAL_COMMUNITY): Payer: Medicare Other | Attending: Cardiovascular Disease

## 2022-09-17 DIAGNOSIS — I5032 Chronic diastolic (congestive) heart failure: Secondary | ICD-10-CM | POA: Diagnosis present

## 2022-09-17 LAB — ECHOCARDIOGRAM COMPLETE
Area-P 1/2: 6.65 cm2
S' Lateral: 2.75 cm

## 2022-09-18 ENCOUNTER — Telehealth: Payer: Self-pay

## 2022-09-18 ENCOUNTER — Encounter: Payer: Medicare Other | Admitting: Dietician

## 2022-09-18 NOTE — Telephone Encounter (Signed)
Lmom to discuss echo results. Waiting on a return call.  

## 2022-09-21 NOTE — Telephone Encounter (Signed)
Called and gave message about the long term monitor that the patient wore.   Joylene Grapes, NP 09/16/2022  8:56 PM EDT     Recent cardiac monitor showed predominantly normal sinus rhythm.  There was some mild slowing of the electrical conduction of the heart. There were also episodes of fast heart rates originating from the top chambers of the heart, the longest episode lasting less than 1 minute. No evidence of atrial fibrillation. Overall a reassuring report. Continue current medications and follow-up as planned. Thank you-EM    Did not see any notes on the Echo, so I was unable to give information about the Echo. Told that I would send message to provider stating that you would like results of Echo. Will call when a provider reads/makes notes.   Verbalized understanding.

## 2022-09-21 NOTE — Telephone Encounter (Signed)
Daughter returned CMA's call regarding results. 

## 2022-09-22 NOTE — Telephone Encounter (Signed)
Spoke with pt and pts daughter. They were notified of echo results. Pt will continue her current medication and f/u as planned.

## 2022-10-01 ENCOUNTER — Emergency Department (HOSPITAL_COMMUNITY): Payer: Medicare Other

## 2022-10-01 ENCOUNTER — Emergency Department (HOSPITAL_COMMUNITY)
Admission: EM | Admit: 2022-10-01 | Discharge: 2022-10-01 | Disposition: A | Discharge status: Home / Self Care | Payer: Medicare Other | Attending: Emergency Medicine | Admitting: Emergency Medicine

## 2022-10-01 ENCOUNTER — Encounter (HOSPITAL_COMMUNITY): Payer: Self-pay

## 2022-10-01 ENCOUNTER — Other Ambulatory Visit: Payer: Self-pay

## 2022-10-01 DIAGNOSIS — Z87440 Personal history of urinary (tract) infections: Secondary | ICD-10-CM | POA: Diagnosis not present

## 2022-10-01 DIAGNOSIS — J40 Bronchitis, not specified as acute or chronic: Secondary | ICD-10-CM | POA: Insufficient documentation

## 2022-10-01 DIAGNOSIS — Z1152 Encounter for screening for COVID-19: Secondary | ICD-10-CM | POA: Insufficient documentation

## 2022-10-01 DIAGNOSIS — R8290 Unspecified abnormal findings in urine: Secondary | ICD-10-CM | POA: Insufficient documentation

## 2022-10-01 DIAGNOSIS — R0602 Shortness of breath: Secondary | ICD-10-CM | POA: Insufficient documentation

## 2022-10-01 DIAGNOSIS — R059 Cough, unspecified: Secondary | ICD-10-CM | POA: Diagnosis present

## 2022-10-01 DIAGNOSIS — I10 Essential (primary) hypertension: Secondary | ICD-10-CM | POA: Diagnosis not present

## 2022-10-01 LAB — CBG MONITORING, ED: Glucose-Capillary: 111 mg/dL — ABNORMAL HIGH (ref 70–99)

## 2022-10-01 LAB — URINALYSIS, W/ REFLEX TO CULTURE (INFECTION SUSPECTED)
Bilirubin Urine: NEGATIVE
Glucose, UA: NEGATIVE mg/dL
Hgb urine dipstick: NEGATIVE
Ketones, ur: NEGATIVE mg/dL
Nitrite: NEGATIVE
Protein, ur: 100 mg/dL — AB
Specific Gravity, Urine: 1.014 (ref 1.005–1.030)
pH: 6 (ref 5.0–8.0)

## 2022-10-01 LAB — SARS CORONAVIRUS 2 BY RT PCR: SARS Coronavirus 2 by RT PCR: NEGATIVE

## 2022-10-01 LAB — COMPREHENSIVE METABOLIC PANEL
ALT: 23 U/L (ref 0–44)
AST: 49 U/L — ABNORMAL HIGH (ref 15–41)
Albumin: 2.2 g/dL — ABNORMAL LOW (ref 3.5–5.0)
Alkaline Phosphatase: 96 U/L (ref 38–126)
Anion gap: 6 (ref 5–15)
BUN: 8 mg/dL (ref 8–23)
CO2: 27 mmol/L (ref 22–32)
Calcium: 8.4 mg/dL — ABNORMAL LOW (ref 8.9–10.3)
Chloride: 97 mmol/L — ABNORMAL LOW (ref 98–111)
Creatinine, Ser: 0.66 mg/dL (ref 0.44–1.00)
GFR, Estimated: >60 mL/min (ref >60)
Glucose, Bld: 113 mg/dL — ABNORMAL HIGH (ref 70–99)
Potassium: 3.4 mmol/L — ABNORMAL LOW (ref 3.5–5.1)
Sodium: 130 mmol/L — ABNORMAL LOW (ref 135–145)
Total Bilirubin: 0.6 mg/dL (ref 0.3–1.2)
Total Protein: 6.4 g/dL — ABNORMAL LOW (ref 6.5–8.1)

## 2022-10-01 LAB — CBC WITH DIFFERENTIAL/PLATELET
Abs Immature Granulocytes: 0.03 10*3/uL (ref 0.00–0.07)
Basophils Absolute: 0 10*3/uL (ref 0.0–0.1)
Basophils Relative: 1 %
Eosinophils Absolute: 0 10*3/uL (ref 0.0–0.5)
Eosinophils Relative: 1 %
HCT: 37.4 % (ref 36.0–46.0)
Hemoglobin: 12.5 g/dL (ref 12.0–15.0)
Immature Granulocytes: 1 %
Lymphocytes Relative: 16 %
Lymphs Abs: 0.8 10*3/uL (ref 0.7–4.0)
MCH: 28.8 pg (ref 26.0–34.0)
MCHC: 33.4 g/dL (ref 30.0–36.0)
MCV: 86.2 fL (ref 80.0–100.0)
Monocytes Absolute: 0.6 10*3/uL (ref 0.1–1.0)
Monocytes Relative: 12 %
Neutro Abs: 3.6 10*3/uL (ref 1.7–7.7)
Neutrophils Relative %: 69 %
Platelets: 163 10*3/uL (ref 150–400)
RBC: 4.34 MIL/uL (ref 3.87–5.11)
RDW: 16.5 % — ABNORMAL HIGH (ref 11.5–15.5)
WBC: 5.2 10*3/uL (ref 4.0–10.5)
nRBC: 0 % (ref 0.0–0.2)

## 2022-10-01 LAB — TROPONIN I (HIGH SENSITIVITY)
Troponin I (High Sensitivity): 15 ng/L (ref <18)
Troponin I (High Sensitivity): 16 ng/L (ref <18)

## 2022-10-01 LAB — BRAIN NATRIURETIC PEPTIDE: B Natriuretic Peptide: 31 pg/mL (ref 0.0–100.0)

## 2022-10-01 MED ORDER — DOXYCYCLINE HYCLATE 100 MG PO CAPS: 100.0000 mg | ORAL_CAPSULE | Freq: Two times a day (BID) | ORAL | 0 refills | Status: AC

## 2022-10-01 MED ORDER — DOXYCYCLINE HYCLATE 100 MG PO TABS
100.0000 mg | ORAL_TABLET | Freq: Once | ORAL | Status: AC
  Administered 2022-10-01: 100 mg via ORAL
  Filled 2022-10-01: qty 1

## 2022-10-01 MED ORDER — BENZONATATE 100 MG PO CAPS: 100.0000 mg | ORAL_CAPSULE | Freq: Three times a day (TID) | ORAL | 0 refills | Status: DC

## 2022-10-01 MED ORDER — FUROSEMIDE 10 MG/ML IJ SOLN
20.0000 mg | Freq: Once | INTRAMUSCULAR | Status: AC
  Administered 2022-10-01: 20 mg via INTRAVENOUS
  Filled 2022-10-01: qty 4

## 2022-10-01 MED ORDER — BENZONATATE 100 MG PO CAPS
200.0000 mg | ORAL_CAPSULE | Freq: Once | ORAL | Status: AC
  Administered 2022-10-01: 200 mg via ORAL
  Filled 2022-10-01: qty 2

## 2022-10-01 MED ORDER — ALBUTEROL SULFATE HFA 108 (90 BASE) MCG/ACT IN AERS
2.0000 | INHALATION_SPRAY | Freq: Once | RESPIRATORY_TRACT | Status: AC
  Administered 2022-10-01: 2 via RESPIRATORY_TRACT
  Filled 2022-10-01: qty 6.7

## 2022-10-01 MED ORDER — AEROCHAMBER PLUS FLO-VU MISC
1.0000 | Freq: Once | Status: AC
  Administered 2022-10-01: 1
  Filled 2022-10-01 (×2): qty 1

## 2022-10-01 NOTE — ED Triage Notes (Addendum)
PT is coming from home. Pt coming in complaining of SOB and coughing. EMS noted some crackles in the left lung field. Pt had a chest x-ray, but did not find anything significant. PT also currently being treated for a UTI. Pt confused at baseline, family states she have have become more confused since coughing started. Pt may also have shingles outbreak on left collar bone.

## 2022-10-01 NOTE — ED Provider Notes (Signed)
Emergency Department Provider Note   I have reviewed the triage vital signs and the nursing notes.   HISTORY  Chief Complaint Shortness of Breath (/) and Cough   HPI Barbara Nelson is a 82 y.o. female with PMH of HTN presents to the ED with cough and SOB. Symptoms worsening over the last week. No fever. No CP. Has been following with PCP who performed outpatient CXR with ? finding in the left lower lobe. No specific abx given for CAP but patient has undergone recent treatment for UTI with Macrobid followed by Cipro. Finished abx 4 days prior.   Daughter also notes some skin breakdown over the left upper chest. She had some adhesive in this location which caused some irritation. The patient has also been scratching at the area. No drainage.   Past Medical History:  Diagnosis Date   Benign essential HTN 10/31/2015   Cardiac tamponade due to viral pericarditis    LBBB (left bundle branch block)    Lung mass    fungal infection s/p resection at [facility]   Postoperative atrial fibrillation (HCC)    Pulmonary nodule     Review of Systems  Constitutional: No fever/chills Cardiovascular: Denies chest pain. Respiratory: Positive shortness of breath. Positive cough.  Gastrointestinal: No abdominal pain.   Genitourinary: Negative for dysuria. Musculoskeletal: Negative for back pain. Skin: Negative for rash. Neurological: Negative for headaches.  ____________________________________________   PHYSICAL EXAM:  VITAL SIGNS: ED Triage Vitals  Enc Vitals Group     BP 10/01/22 1125 (!) 143/80     Pulse Rate 10/01/22 1125 83     Resp 10/01/22 1125 19     Temp 10/01/22 1125 (!) 97.5 F (36.4 C)     Temp Source 10/01/22 1125 Oral     SpO2 10/01/22 1125 97 %     Weight 10/01/22 1126 200 lb (90.7 kg)   Constitutional: Alert and oriented. Well appearing and in no acute distress. Eyes: Conjunctivae are normal.  Head: Atraumatic. Nose: No congestion/rhinnorhea. Mouth/Throat:  Mucous membranes are moist.  Neck: No stridor.   Cardiovascular: Normal rate, regular rhythm. Good peripheral circulation. Grossly normal heart sounds.   Respiratory: Normal respiratory effort.  No retractions. Lungs with crackles at the left base.  Gastrointestinal: Soft and nontender. No distention.  Musculoskeletal: No lower extremity tenderness nor edema. No gross deformities of extremities. Neurologic:  Normal speech and language. No gross focal neurologic deficits are appreciated.  Skin:  Skin is warm and dry. 2 cm area of erythema to the upper left chest. No pustules. No petechiae. Not consistent with shingles rash.    ____________________________________________   LABS (all labs ordered are listed, but only abnormal results are displayed)  Labs Reviewed  URINE CULTURE - Abnormal; Notable for the following components:      Result Value   Culture >=100,000 COLONIES/mL ENTEROCOCCUS FAECIUM (*)    Organism ID, Bacteria ENTEROCOCCUS FAECIUM (*)    All other components within normal limits  COMPREHENSIVE METABOLIC PANEL - Abnormal; Notable for the following components:   Sodium 130 (*)    Potassium 3.4 (*)    Chloride 97 (*)    Glucose, Bld 113 (*)    Calcium 8.4 (*)    Total Protein 6.4 (*)    Albumin 2.2 (*)    AST 49 (*)    All other components within normal limits  CBC WITH DIFFERENTIAL/PLATELET - Abnormal; Notable for the following components:   RDW 16.5 (*)    All other  components within normal limits  URINALYSIS, W/ REFLEX TO CULTURE (INFECTION SUSPECTED) - Abnormal; Notable for the following components:   APPearance HAZY (*)    Protein, ur 100 (*)    Leukocytes,Ua TRACE (*)    Bacteria, UA MANY (*)    All other components within normal limits  CBG MONITORING, ED - Abnormal; Notable for the following components:   Glucose-Capillary 111 (*)    All other components within normal limits  SARS CORONAVIRUS 2 BY RT PCR  BRAIN NATRIURETIC PEPTIDE  TROPONIN I (HIGH  SENSITIVITY)  TROPONIN I (HIGH SENSITIVITY)   ____________________________________________  EKG   EKG Interpretation  Date/Time:  Thursday Oct 01 2022 11:26:14 EDT Ventricular Rate:  85 PR Interval:  172 QRS Duration: 134 QT Interval:  392 QTC Calculation: 467 R Axis:   210 Text Interpretation: Sinus rhythm Atrial premature complex Nonspecific intraventricular conduction delay Anterolateral infarct, age indeterminate Confirmed by Alona Bene [phone]) on 10/01/2022 12:14:58 PM        ____________________________________________  RADIOLOGY  DG Chest 2 View  Result Date: 10/01/2022 CLINICAL DATA:  Cough EXAM: CHEST - 2 VIEW COMPARISON:  09/28/2022 FINDINGS: Stable cardiomegaly. Mild diffuse interstitial prominence. Linear opacity in the right mid lung may be related to scarring or trace fluid within the fissure. No pneumothorax. Degenerative changes of the thoracic spine and shoulders. IMPRESSION: Cardiomegaly with mild diffuse interstitial prominence, which may reflect mild edema. Electronically Signed   By: Duanne Guess D.O.   On: 10/01/2022 13:28    ____________________________________________   PROCEDURES  Procedure(s) performed:   Procedures  None  ____________________________________________   INITIAL IMPRESSION / ASSESSMENT AND PLAN / ED COURSE  Pertinent labs & imaging results that were available during my care of the patient were reviewed by me and considered in my medical decision making (see chart for details).   This patient is Presenting for Evaluation of SOB, which does require a range of treatment options, and is a complaint that involves a high risk of morbidity and mortality.  The Differential Diagnoses includes but is not exclusive to acute coronary syndrome, aortic dissection, pulmonary embolism, cardiac tamponade, community-acquired pneumonia, pericarditis, musculoskeletal chest wall pain, etc.   I did obtain Additional Historical Information  from family at bedside.   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    Clinical Laboratory Tests Ordered, included UA equivocal for infection. No symptoms. Will send culture. Troponin negative x 2. BNP normal. No AKI. Baseline hyponatremia at 130.   Radiologic Tests Ordered, included CXR. I independently interpreted the images and agree with radiology interpretation.   Cardiac Monitor Tracing which shows NSR.    Social Determinants of Health Risk NSR.   Medical Decision Making: Summary:  Patient presents emergency department with cough and congestion symptoms worsening over the past week.  Some crackles at the left lung base noted.  Has been on antibiotics for UTI but would not have a good CAP coverage. No hypoxemia at rest.   Reevaluation with update and discussion with patient. Plan for IV lasix dose here but overall does not appear acutely volume overloaded. Plan for bronchitis treatment with MDI and abx.   Considered admission but no hypoxemia or increased WOB. Plan for IV lasix dose here and abx/MDI at home with strict ED return precautions.   Patient's presentation is most consistent with acute presentation with potential threat to life or bodily function.   Disposition:   ____________________________________________  FINAL  CLINICAL IMPRESSION(S) / ED DIAGNOSES  Final diagnoses:  Bronchitis  SOB (shortness of breath)     NEW OUTPATIENT MEDICATIONS STARTED DURING THIS VISIT:  Discharge Medication List as of 10/01/2022  7:22 PM     START taking these medications   Details  benzonatate (TESSALON) 100 MG capsule Take 1 capsule (100 mg total) by mouth every 8 (eight) hours., Starting Thu 10/01/2022, Print    doxycycline (VIBRAMYCIN) 100 MG capsule Take 1 capsule (100 mg total) by mouth 2 (two) times daily for 7 days., Starting Thu 10/01/2022, Until Thu 10/08/2022, Normal        Note:  This document was  prepared using Dragon voice recognition software and may include unintentional dictation errors.  Alona Bene, MD, [facility]    Jyren Cerasoli, Arlyss Repress, MD 10/10/22 [phone]

## 2022-10-01 NOTE — ED Provider Notes (Signed)
Patient was scheduled for discharge she had been evaluated by Dr. Jacqulyn Bath earlier however she had a coughing episode here with mucus and feeling like she was choking and family did not feel comfortable with her going home and requested evaluation.  Patient has had worsening cough today but based on Dr. Edwin Dada evaluation her labs were reassuring, chest x-ray without acute findings and BNP and troponin were baseline.  COVID was negative.  Patient is satting high 90s on room air and in no acute distress at this time.  Discussed with family there is not significant abnormalities that would testify hospitalization however we could try Tessalon Perles to see if that helped as the inhaler has not helped with the coughing.  They are comfortable with this plan.   [name], MD 10/01/22 [phone]

## 2022-10-01 NOTE — ED Notes (Signed)
Went to put a pure wick on pt family member stated that the pure wick breaks her out down there then came out  the room to ask if we can still put it on the pt

## 2022-10-01 NOTE — Discharge Instructions (Addendum)
I am treating your symptoms as bronchitis. You may use the albuterol inhaler as needed for trouble breathing or severe coughing 2 puffs every 6 hours. Please take the antibiotics as well along with your other home medications.   If you develop any new or suddenly worsening symptoms in the mean time, please return to the ED for re-evaluation.   Use the tessalon pearles as needed for cough.  You can also try honey

## 2022-10-03 LAB — URINE CULTURE: Culture: >=100000 — AB

## 2022-10-04 ENCOUNTER — Telehealth (HOSPITAL_BASED_OUTPATIENT_CLINIC_OR_DEPARTMENT_OTHER): Payer: Self-pay | Admitting: *Deleted

## 2022-10-04 NOTE — Telephone Encounter (Signed)
Post ED Visit - Positive Culture Follow-up: Successful Patient Follow-Up  Culture assessed and recommendations reviewed by:  [x]  Earl Many, Pharm.D. []  Celedonio Miyamoto, Pharm.D., BCPS AQ-ID []  Garvin Fila, Pharm.D., BCPS []  Georgina Pillion, Pharm.D., BCPS []  [place], [address].D., BCPS, AAHIVP []  Estella Husk, Pharm.D., BCPS, AAHIVP []  Lysle Pearl, PharmD, BCPS []  Phillips Climes, PharmD, BCPS []  Agapito Games, PharmD, BCPS []  Verlan Friends, PharmD  Positive urine culture  Spoke with pts daughter and pt. Pt having mild urinary symptoms.  Changes discussed with ED provider: Lorin Roemhildt, PA-C New antibiotic prescription Fosfomycin 3g PO x 1 dose Called to [facility], [place], [place]  Contacted patient, date 10/04/22, time 1610   Patsey Berthold 10/04/2022, 4:09 PM

## 2022-10-11 ENCOUNTER — Emergency Department (HOSPITAL_COMMUNITY): Payer: Medicare Other

## 2022-10-11 ENCOUNTER — Inpatient Hospital Stay (HOSPITAL_COMMUNITY)
Admission: EM | Admit: 2022-10-11 | Discharge: 2022-10-30 | DRG: 871 | Disposition: A | Discharge status: Skilled Nursing Facility | Payer: Medicare Other | Attending: Internal Medicine | Admitting: Internal Medicine

## 2022-10-11 ENCOUNTER — Encounter (HOSPITAL_COMMUNITY): Payer: Self-pay

## 2022-10-11 DIAGNOSIS — R34 Anuria and oliguria: Principal | ICD-10-CM

## 2022-10-11 DIAGNOSIS — Z8249 Family history of ischemic heart disease and other diseases of the circulatory system: Secondary | ICD-10-CM

## 2022-10-11 DIAGNOSIS — E669 Obesity, unspecified: Secondary | ICD-10-CM | POA: Diagnosis present

## 2022-10-11 DIAGNOSIS — E861 Hypovolemia: Secondary | ICD-10-CM | POA: Diagnosis present

## 2022-10-11 DIAGNOSIS — Z91040 Latex allergy status: Secondary | ICD-10-CM

## 2022-10-11 DIAGNOSIS — Z90710 Acquired absence of both cervix and uterus: Secondary | ICD-10-CM

## 2022-10-11 DIAGNOSIS — Z8041 Family history of malignant neoplasm of ovary: Secondary | ICD-10-CM

## 2022-10-11 DIAGNOSIS — K2100 Gastro-esophageal reflux disease with esophagitis, without bleeding: Secondary | ICD-10-CM | POA: Diagnosis present

## 2022-10-11 DIAGNOSIS — R29898 Other symptoms and signs involving the musculoskeletal system: Secondary | ICD-10-CM

## 2022-10-11 DIAGNOSIS — Z888 Allergy status to other drugs, medicaments and biological substances status: Secondary | ICD-10-CM

## 2022-10-11 DIAGNOSIS — A419 Sepsis, unspecified organism: Principal | ICD-10-CM | POA: Diagnosis present

## 2022-10-11 DIAGNOSIS — E43 Unspecified severe protein-calorie malnutrition: Secondary | ICD-10-CM | POA: Diagnosis present

## 2022-10-11 DIAGNOSIS — R339 Retention of urine, unspecified: Secondary | ICD-10-CM | POA: Diagnosis present

## 2022-10-11 DIAGNOSIS — R5381 Other malaise: Secondary | ICD-10-CM | POA: Diagnosis present

## 2022-10-11 DIAGNOSIS — R627 Adult failure to thrive: Secondary | ICD-10-CM | POA: Diagnosis present

## 2022-10-11 DIAGNOSIS — Z87891 Personal history of nicotine dependence: Secondary | ICD-10-CM

## 2022-10-11 DIAGNOSIS — R410 Disorientation, unspecified: Secondary | ICD-10-CM | POA: Diagnosis not present

## 2022-10-11 DIAGNOSIS — E860 Dehydration: Secondary | ICD-10-CM | POA: Diagnosis present

## 2022-10-11 DIAGNOSIS — R32 Unspecified urinary incontinence: Secondary | ICD-10-CM | POA: Diagnosis present

## 2022-10-11 DIAGNOSIS — R4589 Other symptoms and signs involving emotional state: Secondary | ICD-10-CM

## 2022-10-11 DIAGNOSIS — Z880 Allergy status to penicillin: Secondary | ICD-10-CM

## 2022-10-11 DIAGNOSIS — I5032 Chronic diastolic (congestive) heart failure: Secondary | ICD-10-CM | POA: Diagnosis present

## 2022-10-11 DIAGNOSIS — Z6832 Body mass index (BMI) 32.0-32.9, adult: Secondary | ICD-10-CM

## 2022-10-11 DIAGNOSIS — Z91012 Allergy to eggs: Secondary | ICD-10-CM

## 2022-10-11 DIAGNOSIS — R400 Somnolence: Secondary | ICD-10-CM

## 2022-10-11 DIAGNOSIS — J9621 Acute and chronic respiratory failure with hypoxia: Secondary | ICD-10-CM | POA: Diagnosis present

## 2022-10-11 DIAGNOSIS — Z66 Do not resuscitate: Secondary | ICD-10-CM | POA: Diagnosis present

## 2022-10-11 DIAGNOSIS — B370 Candidal stomatitis: Secondary | ICD-10-CM | POA: Diagnosis present

## 2022-10-11 DIAGNOSIS — G9341 Metabolic encephalopathy: Secondary | ICD-10-CM | POA: Diagnosis present

## 2022-10-11 DIAGNOSIS — B3781 Candidal esophagitis: Secondary | ICD-10-CM | POA: Diagnosis present

## 2022-10-11 DIAGNOSIS — I110 Hypertensive heart disease with heart failure: Secondary | ICD-10-CM | POA: Diagnosis present

## 2022-10-11 DIAGNOSIS — Z806 Family history of leukemia: Secondary | ICD-10-CM

## 2022-10-11 DIAGNOSIS — R1312 Dysphagia, oropharyngeal phase: Secondary | ICD-10-CM | POA: Diagnosis present

## 2022-10-11 DIAGNOSIS — E876 Hypokalemia: Secondary | ICD-10-CM | POA: Diagnosis present

## 2022-10-11 DIAGNOSIS — Z79899 Other long term (current) drug therapy: Secondary | ICD-10-CM

## 2022-10-11 DIAGNOSIS — E871 Hypo-osmolality and hyponatremia: Secondary | ICD-10-CM | POA: Diagnosis present

## 2022-10-11 DIAGNOSIS — R4182 Altered mental status, unspecified: Secondary | ICD-10-CM | POA: Insufficient documentation

## 2022-10-11 DIAGNOSIS — Z902 Acquired absence of lung [part of]: Secondary | ICD-10-CM

## 2022-10-11 DIAGNOSIS — Z823 Family history of stroke: Secondary | ICD-10-CM

## 2022-10-11 DIAGNOSIS — E119 Type 2 diabetes mellitus without complications: Secondary | ICD-10-CM | POA: Diagnosis present

## 2022-10-11 DIAGNOSIS — R630 Anorexia: Secondary | ICD-10-CM

## 2022-10-11 DIAGNOSIS — Z881 Allergy status to other antibiotic agents status: Secondary | ICD-10-CM

## 2022-10-11 DIAGNOSIS — Z882 Allergy status to sulfonamides status: Secondary | ICD-10-CM

## 2022-10-11 DIAGNOSIS — Z515 Encounter for palliative care: Secondary | ICD-10-CM

## 2022-10-11 DIAGNOSIS — E8809 Other disorders of plasma-protein metabolism, not elsewhere classified: Secondary | ICD-10-CM | POA: Diagnosis present

## 2022-10-11 DIAGNOSIS — R0600 Dyspnea, unspecified: Secondary | ICD-10-CM | POA: Diagnosis present

## 2022-10-11 DIAGNOSIS — J690 Pneumonitis due to inhalation of food and vomit: Secondary | ICD-10-CM | POA: Diagnosis present

## 2022-10-11 DIAGNOSIS — Z8042 Family history of malignant neoplasm of prostate: Secondary | ICD-10-CM

## 2022-10-11 DIAGNOSIS — R531 Weakness: Secondary | ICD-10-CM

## 2022-10-11 DIAGNOSIS — J189 Pneumonia, unspecified organism: Secondary | ICD-10-CM

## 2022-10-11 DIAGNOSIS — E440 Moderate protein-calorie malnutrition: Secondary | ICD-10-CM | POA: Insufficient documentation

## 2022-10-11 DIAGNOSIS — Z8349 Family history of other endocrine, nutritional and metabolic diseases: Secondary | ICD-10-CM

## 2022-10-11 DIAGNOSIS — Z7189 Other specified counseling: Secondary | ICD-10-CM

## 2022-10-11 DIAGNOSIS — R6520 Severe sepsis without septic shock: Secondary | ICD-10-CM | POA: Diagnosis present

## 2022-10-11 LAB — BLOOD GAS, VENOUS
Acid-Base Excess: 4.5 mmol/L — ABNORMAL HIGH (ref 0.0–2.0)
Bicarbonate: 29.2 mmol/L — ABNORMAL HIGH (ref 20.0–28.0)
O2 Saturation: 59.5 %
Patient temperature: 37
pCO2, Ven: 43 mmHg — ABNORMAL LOW (ref 44–60)
pH, Ven: 7.44 — ABNORMAL HIGH (ref 7.25–7.43)
pO2, Ven: 33 mmHg (ref 32–45)

## 2022-10-11 LAB — COMPREHENSIVE METABOLIC PANEL
ALT: 24 U/L (ref 0–44)
AST: 50 U/L — ABNORMAL HIGH (ref 15–41)
Albumin: 1.7 g/dL — ABNORMAL LOW (ref 3.5–5.0)
Alkaline Phosphatase: 116 U/L (ref 38–126)
Anion gap: 5 (ref 5–15)
BUN: 8 mg/dL (ref 8–23)
CO2: 25 mmol/L (ref 22–32)
Calcium: 8.3 mg/dL — ABNORMAL LOW (ref 8.9–10.3)
Chloride: 98 mmol/L (ref 98–111)
Creatinine, Ser: 0.63 mg/dL (ref 0.44–1.00)
GFR, Estimated: >60 mL/min (ref >60)
Glucose, Bld: 87 mg/dL (ref 70–99)
Potassium: 3.3 mmol/L — ABNORMAL LOW (ref 3.5–5.1)
Sodium: 128 mmol/L — ABNORMAL LOW (ref 135–145)
Total Bilirubin: 0.9 mg/dL (ref 0.3–1.2)
Total Protein: 5.5 g/dL — ABNORMAL LOW (ref 6.5–8.1)

## 2022-10-11 LAB — CBC WITH DIFFERENTIAL/PLATELET
Abs Immature Granulocytes: 0.05 10*3/uL (ref 0.00–0.07)
Basophils Absolute: 0 10*3/uL (ref 0.0–0.1)
Basophils Relative: 1 %
Eosinophils Absolute: 0.1 10*3/uL (ref 0.0–0.5)
Eosinophils Relative: 2 %
HCT: 36.3 % (ref 36.0–46.0)
Hemoglobin: 12.6 g/dL (ref 12.0–15.0)
Immature Granulocytes: 1 %
Lymphocytes Relative: 28 %
Lymphs Abs: 1.1 10*3/uL (ref 0.7–4.0)
MCH: 29.2 pg (ref 26.0–34.0)
MCHC: 34.7 g/dL (ref 30.0–36.0)
MCV: 84.2 fL (ref 80.0–100.0)
Monocytes Absolute: 0.5 10*3/uL (ref 0.1–1.0)
Monocytes Relative: 14 %
Neutro Abs: 2.1 10*3/uL (ref 1.7–7.7)
Neutrophils Relative %: 54 %
Platelets: 124 10*3/uL — ABNORMAL LOW (ref 150–400)
RBC: 4.31 MIL/uL (ref 3.87–5.11)
RDW: 16.2 % — ABNORMAL HIGH (ref 11.5–15.5)
WBC: 3.9 10*3/uL — ABNORMAL LOW (ref 4.0–10.5)
nRBC: 0 % (ref 0.0–0.2)

## 2022-10-11 LAB — URINALYSIS, W/ REFLEX TO CULTURE (INFECTION SUSPECTED)
Bilirubin Urine: NEGATIVE
Glucose, UA: NEGATIVE mg/dL
Hgb urine dipstick: NEGATIVE
Ketones, ur: NEGATIVE mg/dL
Nitrite: NEGATIVE
Protein, ur: 30 mg/dL — AB
Specific Gravity, Urine: 1.013 (ref 1.005–1.030)
pH: 6 (ref 5.0–8.0)

## 2022-10-11 LAB — RAPID URINE DRUG SCREEN, HOSP PERFORMED
Amphetamines: NOT DETECTED
Barbiturates: NOT DETECTED
Benzodiazepines: NOT DETECTED
Cocaine: NOT DETECTED
Opiates: NOT DETECTED
Tetrahydrocannabinol: NOT DETECTED

## 2022-10-11 LAB — LACTIC ACID, PLASMA: Lactic Acid, Venous: 1.2 mmol/L (ref 0.5–1.9)

## 2022-10-11 LAB — CULTURE, BLOOD (ROUTINE X 2)

## 2022-10-11 LAB — AMMONIA: Ammonia: 15 umol/L (ref 9–35)

## 2022-10-11 MED ORDER — SODIUM CHLORIDE 0.9 % IV SOLN
2.0000 g | INTRAVENOUS | Status: AC
  Administered 2022-10-12 – 2022-10-15 (×4): 2 g via INTRAVENOUS
  Filled 2022-10-11 (×4): qty 20

## 2022-10-11 MED ORDER — SODIUM CHLORIDE 0.9 % IV SOLN: INTRAVENOUS | Status: AC

## 2022-10-11 MED ORDER — METRONIDAZOLE 500 MG/100ML IV SOLN
500.0000 mg | Freq: Two times a day (BID) | INTRAVENOUS | Status: AC
  Administered 2022-10-12 – 2022-10-15 (×8): 500 mg via INTRAVENOUS
  Filled 2022-10-11 (×8): qty 100

## 2022-10-11 MED ORDER — LEVOFLOXACIN IN D5W 500 MG/100ML IV SOLN: 500.0000 mg | INTRAVENOUS | Status: DC

## 2022-10-11 MED ORDER — ACETAMINOPHEN 325 MG PO TABS
650.0000 mg | ORAL_TABLET | Freq: Four times a day (QID) | ORAL | Status: AC | PRN
  Administered 2022-10-11 – 2022-10-12 (×2): 650 mg via ORAL
  Filled 2022-10-11 (×2): qty 2

## 2022-10-11 MED ORDER — KETOTIFEN FUMARATE 0.035 % OP SOLN
1.0000 [drp] | Freq: Two times a day (BID) | OPHTHALMIC | Status: DC
  Administered 2022-10-11 – 2022-10-30 (×35): 1 [drp] via OPHTHALMIC
  Filled 2022-10-11: qty 5

## 2022-10-11 MED ORDER — METOPROLOL SUCCINATE ER 25 MG PO TB24
12.5000 mg | ORAL_TABLET | Freq: Every day | ORAL | Status: DC
  Administered 2022-10-12 – 2022-10-29 (×16): 12.5 mg via ORAL
  Filled 2022-10-11 (×19): qty 1
  Filled 2022-10-11: qty 0.5

## 2022-10-11 MED ORDER — LEVOFLOXACIN IN D5W 750 MG/150ML IV SOLN
750.0000 mg | Freq: Once | INTRAVENOUS | Status: AC
  Administered 2022-10-11: 750 mg via INTRAVENOUS
  Filled 2022-10-11: qty 150

## 2022-10-11 MED ORDER — ALBUTEROL SULFATE (2.5 MG/3ML) 0.083% IN NEBU
2.5000 mg | INHALATION_SOLUTION | RESPIRATORY_TRACT | Status: DC | PRN
  Administered 2022-10-18: 2.5 mg via RESPIRATORY_TRACT
  Filled 2022-10-11: qty 3

## 2022-10-11 MED ORDER — BENZONATATE 100 MG PO CAPS
100.0000 mg | ORAL_CAPSULE | Freq: Three times a day (TID) | ORAL | Status: DC
  Administered 2022-10-11 – 2022-10-12 (×4): 100 mg via ORAL
  Filled 2022-10-11 (×5): qty 1

## 2022-10-11 MED ORDER — ENOXAPARIN SODIUM 40 MG/0.4ML IJ SOSY
40.0000 mg | PREFILLED_SYRINGE | INTRAMUSCULAR | Status: DC
  Administered 2022-10-11 – 2022-10-29 (×19): 40 mg via SUBCUTANEOUS
  Filled 2022-10-11 (×19): qty 0.4

## 2022-10-11 MED ORDER — ONDANSETRON HCL 4 MG/2ML IJ SOLN: 4.0000 mg | Freq: Four times a day (QID) | INTRAMUSCULAR | Status: DC | PRN

## 2022-10-11 MED ORDER — SODIUM CHLORIDE 0.9 % IV BOLUS
500.0000 mL | Freq: Once | INTRAVENOUS | Status: AC
  Administered 2022-10-11: 500 mL via INTRAVENOUS

## 2022-10-11 NOTE — ED Provider Notes (Signed)
Blood pressure 128/73, pulse 69, temperature 97.6 F (36.4 C), temperature source Oral, resp. rate 17, height 5\' 6"  (1.676 m), weight 90.7 kg, SpO2 100 %.  Assuming care from Dr. Adela Lank.  In short, Barbara Nelson is a 82 y.o. female with a chief complaint of Urinary Tract Infection .  Refer to the original H&P for additional details.  The current plan of care is to follow up on remaining imaging.  Patient with ? PNA on CXR. Plan for admit for speech evaluation and abx.   Discussed patient's case with [facility] to request admission. Patient and family (if present) updated with plan.   I reviewed all nursing notes, vitals, pertinent old records, EKGs, labs, imaging (as available).    Maia Plan, MD 10/11/22 1540

## 2022-10-11 NOTE — ED Notes (Signed)
ED TO INPATIENT HANDOFF REPORT  Name/Age/Gender Barbara Nelson 82 y.o. female  Code Status    Code Status Orders  (From admission, onward)           Start     Ordered   10/11/22 1132  Full code  Continuous       Question:  By:  Answer:  Consent: discussion documented in EHR   10/11/22 1133           Code Status History     Date Active Date Inactive Code Status Order ID Comments User Context   06/30/2022 2019 07/16/2022 1939 Full Code [id number]  Angie Fava, DO ED   06/18/2022 2010 06/27/2022 0018 Full Code [id number]  Hillary Bow, DO Inpatient       Home/SNF/Other Home  Chief Complaint Aspiration pneumonia [facility]) [J69.0]  Level of Care/Admitting Diagnosis ED Disposition     ED Disposition  Admit   Condition  --   Comment  Hospital Area: [facility] [[id number]]  Level of Care: Med-Surg [16]  May admit patient to [facility] or [facility] if equivalent level of care is available:: Yes  Covid Evaluation: Asymptomatic - no recent exposure (last 10 days) testing not required  Diagnosis: Aspiration pneumonia [facility]) [[id number]]  Admitting Physician: Maryln Gottron [[id number]]  Attending Physician: Olexa.Dam, MIR Jaxson.Roy [[id number]]  Certification:: I certify this patient will need inpatient services for at least 2 midnights  Estimated Length of Stay: 3          Medical History Past Medical History:  Diagnosis Date   Benign essential HTN 10/31/2015   Cardiac tamponade due to viral pericarditis    LBBB (left bundle branch block)    Lung mass    fungal infection s/p resection at [facility]   Postoperative atrial fibrillation (HCC)    Pulmonary nodule     Allergies Allergies  Allergen Reactions   Latex Rash   Penicillins Rash and Itching   Tape Hives and Rash   Z-Pak [Azithromycin] Rash and Other (See Comments)    Mouth sores   Egg-Derived Products Nausea And Vomiting   Potassium Nausea And Vomiting   Amoxil [Amoxicillin] Rash    Sulfa Antibiotics Rash    IV Location/Drains/Wounds Patient Lines/Drains/Airways Status     Active Line/Drains/Airways     Name Placement date Placement time Site Days   Peripheral IV 10/11/22 20 G Left Antecubital 10/11/22  0610  Antecubital  less than 1            Labs/Imaging Results for orders placed or performed during the hospital encounter of 10/11/22 (from the past 48 hour(s))  Ammonia     Status: None   Collection Time: 10/11/22  6:06 AM  Result Value Ref Range   Ammonia 15 9 - 35 umol/L    Comment: Performed at [facility], [address] [place], [place] [address]  Comprehensive metabolic panel     Status: Abnormal   Collection Time: 10/11/22  6:06 AM  Result Value Ref Range   Sodium 128 (L) 135 - 145 mmol/L   Potassium 3.3 (L) 3.5 - 5.1 mmol/L   Chloride 98 98 - 111 mmol/L   CO2 25 22 - 32 mmol/L   Glucose, Bld 87 70 - 99 mg/dL    Comment: Glucose reference range applies only to samples taken after fasting for at least 8 hours.   BUN 8 8 - 23 mg/dL   Creatinine, Ser 0.34 0.44 -  1.00 mg/dL   Calcium 8.3 (L) 8.9 - 10.3 mg/dL   Total Protein 5.5 (L) 6.5 - 8.1 g/dL   Albumin 1.7 (L) 3.5 - 5.0 g/dL   AST 50 (H) 15 - 41 U/L   ALT 24 0 - 44 U/L   Alkaline Phosphatase 116 38 - 126 U/L   Total Bilirubin 0.9 0.3 - 1.2 mg/dL   GFR, Estimated >16 >10 mL/min    Comment: (NOTE) Calculated using the CKD-EPI Creatinine Equation (2021)    Anion gap 5 5 - 15    Comment: Performed at [facility], [address] [place], [place] [address]  Lactic acid, plasma     Status: None   Collection Time: 10/11/22  6:06 AM  Result Value Ref Range   Lactic Acid, Venous 1.2 0.5 - 1.9 mmol/L    Comment: Performed at [facility], [address], [place], [place] [address]  CBC WITH DIFFERENTIAL     Status: Abnormal   Collection Time: 10/11/22  6:06 AM  Result Value Ref Range   WBC 3.9 (L) 4.0 - 10.5 K/uL   RBC 4.31 3.87 -  5.11 MIL/uL   Hemoglobin 12.6 12.0 - 15.0 g/dL   HCT 19.1 47.8 - 29.5 %   MCV 84.2 80.0 - 100.0 fL   MCH 29.2 26.0 - 34.0 pg   MCHC 34.7 30.0 - 36.0 g/dL   RDW 62.1 (H) 30.8 - 65.7 %   Platelets 124 (L) 150 - 400 K/uL   nRBC 0.0 0.0 - 0.2 %   Neutrophils Relative % 54 %   Neutro Abs 2.1 1.7 - 7.7 K/uL   Lymphocytes Relative 28 %   Lymphs Abs 1.1 0.7 - 4.0 K/uL   Monocytes Relative 14 %   Monocytes Absolute 0.5 0.1 - 1.0 K/uL   Eosinophils Relative 2 %   Eosinophils Absolute 0.1 0.0 - 0.5 K/uL   Basophils Relative 1 %   Basophils Absolute 0.0 0.0 - 0.1 K/uL   Immature Granulocytes 1 %   Abs Immature Granulocytes 0.05 0.00 - 0.07 K/uL    Comment: Performed at [facility], [address], [place], [place] [address]  Blood gas, venous     Status: Abnormal   Collection Time: 10/11/22  6:06 AM  Result Value Ref Range   pH, Ven 7.44 (H) 7.25 - 7.43   pCO2, Ven 43 (L) 44 - 60 mmHg   pO2, Ven 33 32 - 45 mmHg   Bicarbonate 29.2 (H) 20.0 - 28.0 mmol/L   Acid-Base Excess 4.5 (H) 0.0 - 2.0 mmol/L   O2 Saturation 59.5 %   Patient temperature 37.0     Comment: Performed at [facility], [address] [place], [place] [address]  Urinalysis, w/ Reflex to Culture (Infection Suspected) -Urine, Clean Catch     Status: Abnormal   Collection Time: 10/11/22  7:24 AM  Result Value Ref Range   Specimen Source URINE, CLEAN CATCH    Color, Urine YELLOW YELLOW   APPearance CLEAR CLEAR   Specific Gravity, Urine 1.013 1.005 - 1.030   pH 6.0 5.0 - 8.0   Glucose, UA NEGATIVE NEGATIVE mg/dL   Hgb urine dipstick NEGATIVE NEGATIVE   Bilirubin Urine NEGATIVE NEGATIVE   Ketones, ur NEGATIVE NEGATIVE mg/dL   Protein, ur 30 (A) NEGATIVE mg/dL   Nitrite NEGATIVE NEGATIVE   Leukocytes,Ua TRACE (A) NEGATIVE   RBC / HPF 0-5 0 - 5 RBC/hpf   WBC, UA  6-10 0 - 5 WBC/hpf    Comment:        Reflex urine culture not performed if WBC <=10, OR if Squamous epithelial cells  >5. If Squamous epithelial cells >5 suggest recollection.    Bacteria, UA RARE (A) NONE SEEN   Squamous Epithelial / HPF 0-5 0 - 5 /HPF   Hyaline Casts, UA PRESENT     Comment: Performed at [facility], [address], [place], [place] [address]  Urine rapid drug screen (hosp performed)     Status: None   Collection Time: 10/11/22  7:24 AM  Result Value Ref Range   Opiates NONE DETECTED NONE DETECTED   Cocaine NONE DETECTED NONE DETECTED   Benzodiazepines NONE DETECTED NONE DETECTED   Amphetamines NONE DETECTED NONE DETECTED   Tetrahydrocannabinol NONE DETECTED NONE DETECTED   Barbiturates NONE DETECTED NONE DETECTED    Comment: (NOTE) DRUG SCREEN FOR MEDICAL PURPOSES ONLY.  IF CONFIRMATION IS NEEDED FOR ANY PURPOSE, NOTIFY LAB WITHIN 5 DAYS.  LOWEST DETECTABLE LIMITS FOR URINE DRUG SCREEN Drug Class                     Cutoff (ng/mL) Amphetamine and metabolites    1000 Barbiturate and metabolites    200 Benzodiazepine                 200 Opiates and metabolites        300 Cocaine and metabolites        300 THC                            50 Performed at [facility], [address] [place], [place] [address]    DG Chest Port 1 View  Result Date: 10/11/2022 CLINICAL DATA:  Altered mental status. EXAM: PORTABLE CHEST 1 VIEW COMPARISON:  10/01/2022 FINDINGS: The cardio pericardial silhouette is enlarged. There is pulmonary vascular congestion without overt pulmonary edema. Surgical staple line noted right mid lung with adjacent nodular opacity, potentially scarring. Bibasilar atelectasis or infiltrate evident without substantial pleural effusion bones are diffusely demineralized. IMPRESSION: 1. Enlargement of the cardiopericardial silhouette with pulmonary vascular congestion. 2. Bibasilar atelectasis or infiltrate. 3. Surgical staple line right mid lung with associated nodular opacity. This could reflect scarring but the patient has a history  of lung cancer, close follow-up for local recurrence recommended. Electronically Signed   By: Kennith Center M.D.   On: 10/11/2022 08:34   CT HEAD WO CONTRAST  Result Date: 10/11/2022 CLINICAL DATA:  Mental status changes. EXAM: CT HEAD WITHOUT CONTRAST TECHNIQUE: Contiguous axial images were obtained from the base of the skull through the vertex without intravenous contrast. RADIATION DOSE REDUCTION: This exam was performed according to the departmental dose-optimization program which includes automated exposure control, adjustment of the mA and/or kV according to patient size and/or use of iterative reconstruction technique. COMPARISON:  06/30/2022 FINDINGS: Brain: There is no evidence for acute hemorrhage, hydrocephalus, mass lesion, or abnormal extra-axial fluid collection. No definite CT evidence for acute infarction. Patchy low attenuation in the deep hemispheric and periventricular white matter is nonspecific, but likely reflects chronic microvascular ischemic demyelination. Vascular: No hyperdense vessel or unexpected calcification. Skull: No evidence for fracture. No worrisome lytic or sclerotic lesion. Sinuses/Orbits: The visualized paranasal sinuses and mastoid air cells are clear. Visualized portions of the globes and intraorbital fat are unremarkable. Other: None IMPRESSION: 1. No acute intracranial abnormality. 2. Chronic  small vessel ischemic disease. Electronically Signed   By: Kennith Center M.D.   On: 10/11/2022 06:32    Pending Labs Unresulted Labs (From admission, onward)     Start     Ordered   10/11/22 0859  Culture, blood (routine x 2)  BLOOD CULTURE X 2,   R (with STAT occurrences)      10/11/22 0858            Vitals/Pain Today's Vitals   10/11/22 0530 10/11/22 0630 10/11/22 0900 10/11/22 0920  BP: 123/72 128/73 137/81   Pulse: 69 69 61   Resp: 18 17 19    Temp: 97.6 F (36.4 C)   (!) 97.4 F (36.3 C)  TempSrc: Oral   Oral  SpO2: 97% 100% 100%   Weight:       Height:        Isolation Precautions No active isolations  Medications Medications  metoprolol succinate (TOPROL-XL) 24 hr tablet 12.5 mg (has no administration in time range)  benzonatate (TESSALON) capsule 100 mg (has no administration in time range)  ketotifen (ZADITOR) 0.035 % ophthalmic solution 1 drop (has no administration in time range)  enoxaparin (LOVENOX) injection 40 mg (has no administration in time range)  0.9 %  sodium chloride infusion (has no administration in time range)  ondansetron (ZOFRAN) injection 4 mg (has no administration in time range)  albuterol (PROVENTIL) (2.5 MG/3ML) 0.083% nebulizer solution 2.5 mg (has no administration in time range)  cefTRIAXone (ROCEPHIN) 2 g in sodium chloride 0.9 % 100 mL IVPB (has no administration in time range)  metroNIDAZOLE (FLAGYL) IVPB 500 mg (has no administration in time range)  sodium chloride 0.9 % bolus 500 mL (0 mLs Intravenous Stopped 10/11/22 0832)  levofloxacin (LEVAQUIN) IVPB 750 mg (0 mg Intravenous Stopped 10/11/22 1127)    Mobility non-ambulatory

## 2022-10-11 NOTE — ED Provider Notes (Signed)
[facility] AT [facility] Provider Note   CSN: [id number] Arrival date & time: 10/11/22  0347     History  Chief Complaint  Patient presents with   Urinary Tract Infection    Barbara Nelson is a 82 y.o. female.  82 yo F with a history of complaints and no urinary output since yesterday about 3 PM.  Patient has been struggling with urinary tract infections off and on for the past month.  Per the family she is also developed an inability to ambulate in the past week and has been having difficulty swallowing over the past 24 to 48 hours.  Has been having some cough and congestion.   Urinary Tract Infection      Home Medications Prior to Admission medications   Medication Sig Start Date End Date Taking? Authorizing Provider  benzonatate (TESSALON) 100 MG capsule Take 1 capsule (100 mg total) by mouth every 8 (eight) hours. 10/01/22   [name], MD  furosemide (LASIX) 20 MG tablet Take 1 tablet (20 mg total) by mouth daily. 07/21/22 08/20/22  [name], MD  metoprolol succinate (TOPROL-XL) 25 MG 24 hr tablet Take 0.5 tablets (12.5 mg total) by mouth daily. 06/26/22   [name], [name], MD  Multiple Vitamin (MULTIVITAMIN WITH MINERALS) TABS tablet Take 1 tablet by mouth daily.    [provider]  nitrofurantoin, macrocrystal-monohydrate, (MACROBID) 100 MG capsule Take 1 capsule (100 mg total) by mouth every 12 (twelve) hours. 08/13/22   [name], [name], MD  polyethylene glycol powder (MIRALAX) 17 GM/SCOOP powder Take 17 g by mouth 2 (two) times daily. Decrease to once daily if having watery stools. 07/16/22   [name], MD      Allergies    Latex, Penicillins, Tape, Z-pak [azithromycin], Egg-derived products, Potassium, Amoxil [amoxicillin], and Sulfa antibiotics    Review of Systems   Review of Systems  Physical Exam Updated Vital Signs BP 128/73   Pulse 69   Temp 97.6 F (36.4 C) (Oral)   Resp 17   Ht 5\' 6"   (1.676 m)   Wt 90.7 kg   LMP  (LMP Unknown)   SpO2 100%   BMI 32.27 kg/m  Physical Exam Vitals and nursing note reviewed.  Constitutional:      General: She is not in acute distress.    Appearance: She is well-developed. She is not diaphoretic.  HENT:     Head: Normocephalic and atraumatic.  Eyes:     Pupils: Pupils are equal, round, and reactive to light.  Cardiovascular:     Rate and Rhythm: Normal rate and regular rhythm.     Heart sounds: No murmur heard.    No friction rub. No gallop.  Pulmonary:     Effort: Pulmonary effort is normal.     Breath sounds: No wheezing or rales.  Abdominal:     General: There is no distension.     Palpations: Abdomen is soft.     Tenderness: There is no abdominal tenderness.  Musculoskeletal:        General: No tenderness.     Cervical back: Normal range of motion and neck supple.  Skin:    General: Skin is warm and dry.  Neurological:     Mental Status: She is alert and oriented to person, place, and time.     Comments: Weakness to bilateral lower extremities.  Psychiatric:        Behavior: Behavior normal.  ED Results / Procedures / Treatments   Labs (all labs ordered are listed, but only abnormal results are displayed) Labs Reviewed  COMPREHENSIVE METABOLIC PANEL - Abnormal; Notable for the following components:      Result Value   Sodium 128 (*)    Potassium 3.3 (*)    Calcium 8.3 (*)    Total Protein 5.5 (*)    Albumin 1.7 (*)    AST 50 (*)    All other components within normal limits  BLOOD GAS, VENOUS - Abnormal; Notable for the following components:   pH, Ven 7.44 (*)    pCO2, Ven 43 (*)    Bicarbonate 29.2 (*)    Acid-Base Excess 4.5 (*)    All other components within normal limits  AMMONIA  LACTIC ACID, PLASMA  URINALYSIS, W/ REFLEX TO CULTURE (INFECTION SUSPECTED)  CBC WITH DIFFERENTIAL/PLATELET  RAPID URINE DRUG SCREEN, HOSP PERFORMED    EKG EKG Interpretation  Date/Time:  Sunday Oct 11 2022  06:23:38 EDT Ventricular Rate:  68 PR Interval:  186 QRS Duration: 147 QT Interval:  457 QTC Calculation: 487 R Axis:   224 Text Interpretation: Sinus rhythm Probable left atrial enlargement Nonspecific intraventricular conduction delay Abnormal lateral Q waves Anterior infarct, old No significant change since last tracing Confirmed by Melene Plan [phone]) on 10/11/2022 6:33:40 AM  Radiology CT HEAD WO CONTRAST  Result Date: 10/11/2022 CLINICAL DATA:  Mental status changes. EXAM: CT HEAD WITHOUT CONTRAST TECHNIQUE: Contiguous axial images were obtained from the base of the skull through the vertex without intravenous contrast. RADIATION DOSE REDUCTION: This exam was performed according to the departmental dose-optimization program which includes automated exposure control, adjustment of the mA and/or kV according to patient size and/or use of iterative reconstruction technique. COMPARISON:  06/30/2022 FINDINGS: Brain: There is no evidence for acute hemorrhage, hydrocephalus, mass lesion, or abnormal extra-axial fluid collection. No definite CT evidence for acute infarction. Patchy low attenuation in the deep hemispheric and periventricular white matter is nonspecific, but likely reflects chronic microvascular ischemic demyelination. Vascular: No hyperdense vessel or unexpected calcification. Skull: No evidence for fracture. No worrisome lytic or sclerotic lesion. Sinuses/Orbits: The visualized paranasal sinuses and mastoid air cells are clear. Visualized portions of the globes and intraorbital fat are unremarkable. Other: None IMPRESSION: 1. No acute intracranial abnormality. 2. Chronic small vessel ischemic disease. Electronically Signed   By: Kennith Center M.D.   On: 10/11/2022 06:32    Procedures Procedures    Medications Ordered in ED Medications  sodium chloride 0.9 % bolus 500 mL (500 mLs Intravenous New Bag/Given 10/11/22 1914)    ED Course/ Medical Decision Making/ A&P                              Medical Decision Making Amount and/or Complexity of Data Reviewed Labs: ordered. Radiology: ordered.   82 yo F with a chief complaint of not urinating since 4 PM yesterday.  She does not feel like she needs to urinate.  Has a history of acute urinary retention on my record review.  No known etiology of that time.  Will obtain a UA.  Altered mental status workup as she is also been increasingly confused.  CT of the head chest x-ray.  Per the family she also is newly not been able to walk over the past week.  Unknown etiology.  Patient care was signed out to Dr. Jacqulyn Bath, please see his note for further details  care in the ED.  Lactate normal.  Mild hyponatremia.  Renal function at baseline.  Ammonia normal.  Not hypercarbic.  The patients results and plan were reviewed and discussed.   Any x-rays performed were independently reviewed by myself.   Differential diagnosis were considered with the presenting HPI.  Medications  sodium chloride 0.9 % bolus 500 mL (500 mLs Intravenous New Bag/Given 10/11/22 0628)    Vitals:   10/11/22 0524 10/11/22 0530 10/11/22 0630  BP:  123/72 128/73  Pulse:  69 69  Resp:  18 17  Temp:  97.6 F (36.4 C)   TempSrc:  Oral   SpO2:  97% 100%  Weight: 90.7 kg    Height: 5\' 6"  (1.676 m)      Final diagnoses:  Decreased urine output  Confusion  Weakness of both lower extremities           Final Clinical Impression(s) / ED Diagnoses Final diagnoses:  Decreased urine output  Confusion  Weakness of both lower extremities    Rx / DC Orders ED Discharge Orders     None         Melene Plan, DO 10/11/22 1610

## 2022-10-11 NOTE — ED Notes (Signed)
Unsuccessful sticks for 2nd set of blood cultures. Antibiotics started before 2nd set of blood cultures.

## 2022-10-11 NOTE — H&P (Addendum)
History and Physical  [name]:[id number] DOB: August 23, 1940 DOA: 10/11/2022  PCP: [name], MD   Chief Complaint: Weakness, choking with p.o. 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  Most of the history is provided by the patient's daughter, as the patient is slightly confused and quite sleepy, though she is interactive.  Daughter who is at the bedside states that patient at baseline is quite sharp, and independent.  However being back in February she was diagnosed with a urinary tract infection, and has been quite weak since that time and was just getting back to feeling like her normal self.  Came back to the ER on the 16th of this month with concerns about bronchitis, was diagnosed with UTI and eventually prescribed antibiotics.  She never seem to get her strength back after this.  Over the last few days, patient has been slightly confused, has been very weak not eating or drinking very much.  Family also noticed that she seems to choke and cough whenever she tries to eat or drink something.  This has happened to her intermittently in the past, but is more consistent now.  ED Course: In the emergency department, patient has been afebrile, hemodynamically stable and saturating 100% on room air.  Lab work was done, shows WBC 3.9, hemoglobin 12, platelets 124, sodium 128, potassium 3.3, normal renal function.  Due to her multiple allergies, she was given IV Levaquin to cover for possible aspiration pneumonia.  Hospitalist was contacted for admission.  Review of Systems: Please see HPI for pertinent positives and negatives. A complete 10 system review of systems are otherwise negative.  Past Medical History:  Diagnosis Date   Benign essential  HTN 10/31/2015   Cardiac tamponade due to viral pericarditis    LBBB (left bundle branch block)    Lung mass    fungal infection s/p resection at [facility]   Postoperative atrial fibrillation [facility])    Pulmonary nodule    Past Surgical History:  Procedure Laterality Date   ABDOMINAL HYSTERECTOMY  1988   chest tube placement  09/03/11   LUNG REMOVAL, PARTIAL     right knee arthoscopy with meniscal repair     subxiphoid pericardial window and drainage anterior partial pericardiectomy  09/03/11    Social History:  reports that she quit smoking about 43 years ago. Her smoking use included cigarettes. She has a 15.00 pack-year smoking history. She has never used smokeless tobacco. She reports that she does not drink alcohol and does not use drugs.   Allergies  Allergen Reactions   Latex Rash   Penicillins Rash and Itching   Tape Hives and Rash   Z-Pak [Azithromycin] Rash and Other (See Comments)    Mouth sores   Egg-Derived Products Nausea And Vomiting   Potassium Nausea And Vomiting   Amoxil [Amoxicillin] Rash   Sulfa Antibiotics Rash    Family History  Problem Relation Age of Onset   Hypertension Father    Stroke Father    Hypertension Mother    Ovarian cancer Mother    Prostate cancer Brother    Thyroid disease Sister    Hypertension Sister    Leukemia Maternal Aunt      Prior to Admission medications   Medication Sig Start Date End  Date Taking? Authorizing Provider  azelastine (OPTIVAR) 0.05 % ophthalmic solution Apply 1 drop to eye 2 (two) times daily. 09/16/22   [provider]  benzonatate (TESSALON) 100 MG capsule Take 1 capsule (100 mg total) by mouth every 8 (eight) hours. 10/01/22   [name], MD  ciprofloxacin (CIPRO) 500 MG tablet Take 500 mg by mouth daily. 09/22/22   [provider]  fluconazole (DIFLUCAN) 100 MG tablet Take 100 mg by mouth once.    [provider]  fosfomycin (MONUROL) 3 g PACK Take 3 g by mouth once. 10/04/22    [provider]  furosemide (LASIX) 20 MG tablet Take 1 tablet (20 mg total) by mouth daily. 07/21/22 08/20/22  [name], MD  hydrochlorothiazide (HYDRODIURIL) 12.5 MG tablet Take 12.5 mg by mouth daily. 08/16/22   [provider]  metoprolol succinate (TOPROL-XL) 25 MG 24 hr tablet Take 0.5 tablets (12.5 mg total) by mouth daily. 06/26/22   [name], [name], MD  Multiple Vitamin (MULTIVITAMIN WITH MINERALS) TABS tablet Take 1 tablet by mouth daily.    [provider]  nitrofurantoin (MACRODANTIN) 100 MG capsule Take 100 mg by mouth every 12 (twelve) hours. 09/11/22   [provider]  nitrofurantoin, macrocrystal-monohydrate, (MACROBID) 100 MG capsule Take 1 capsule (100 mg total) by mouth every 12 (twelve) hours. 08/13/22   [name], [name], MD  nystatin (MYCOSTATIN) 100000 UNIT/ML suspension Take 5 mLs by mouth 4 (four) times daily. 09/13/22   [provider]  polyethylene glycol powder (MIRALAX) 17 GM/SCOOP powder Take 17 g by mouth 2 (two) times daily. Decrease to once daily if having watery stools. 07/16/22   [name], MD  triamcinolone (KENALOG) 0.025 % ointment Apply 1 Application topically 2 (two) times daily. Upper back, arms, legs 08/13/22   [provider]    Physical Exam: BP 137/81   Pulse 61   Temp (!) 97.4 F (36.3 C) (Oral)   Resp 19   Ht 5\' 6"  (1.676 m)   Wt 90.7 kg   LMP  (LMP Unknown)   SpO2 100%   BMI 32.27 kg/m   General: Elderly female sitting up on a stretcher in the ER with her daughter at the bedside.  She is tired appearing, but alert, oriented to self and place. Eyes: EOMI, clear conjuctivae, white sclerea Neck: supple, no masses, trachea mildline  Cardiovascular: RRR, no murmurs or rubs, no peripheral edema  Respiratory: clear to auscultation bilaterally, no wheezes, bibasilar crackles  Abdomen: soft, nontender, nondistended, normal bowel tones heard  Skin: dry, no rashes  Musculoskeletal: no  joint effusions, normal range of motion  Psychiatric: appropriate affect, normal speech  Neurologic: extraocular muscles intact, clear speech, moving all extremities with intact sensorium          Labs on Admission:  Basic Metabolic Panel: Recent Labs  Lab 10/11/22 0606  NA 128*  K 3.3*  CL 98  CO2 25  GLUCOSE 87  BUN 8  CREATININE 0.63  CALCIUM 8.3*   Liver Function Tests: Recent Labs  Lab 10/11/22 0606  AST 50*  ALT 24  ALKPHOS 116  BILITOT 0.9  PROT 5.5*  ALBUMIN 1.7*   No results for input(s): "LIPASE", "AMYLASE" in the last 168 hours. Recent Labs  Lab 10/11/22 0606  AMMONIA 15   CBC: Recent Labs  Lab 10/11/22 0606  WBC 3.9*  NEUTROABS 2.1  HGB 12.6  HCT 36.3  MCV 84.2  PLT 124*   Cardiac Enzymes: No  results for input(s): "CKTOTAL", "CKMB", "CKMBINDEX", "TROPONINI" in the last 168 hours.  BNP (last 3 results) Recent Labs    06/18/22 0803 06/30/22 1406 10/01/22 1319  BNP 50.2 28.2 31.0    ProBNP (last 3 results) No results for input(s): "PROBNP" in the last 8760 hours.  CBG: No results for input(s): "GLUCAP" in the last 168 hours.  Radiological Exams on Admission: DG Chest Port 1 View  Result Date: 10/11/2022 CLINICAL DATA:  Altered mental status. EXAM: PORTABLE CHEST 1 VIEW COMPARISON:  10/01/2022 FINDINGS: The cardio pericardial silhouette is enlarged. There is pulmonary vascular congestion without overt pulmonary edema. Surgical staple line noted right mid lung with adjacent nodular opacity, potentially scarring. Bibasilar atelectasis or infiltrate evident without substantial pleural effusion bones are diffusely demineralized. IMPRESSION: 1. Enlargement of the cardiopericardial silhouette with pulmonary vascular congestion. 2. Bibasilar atelectasis or infiltrate. 3. Surgical staple line right mid lung with associated nodular opacity. This could reflect scarring but the patient has a history of lung cancer, close follow-up for local recurrence  recommended. Electronically Signed   By: Kennith Center M.D.   On: 10/11/2022 08:34   CT HEAD WO CONTRAST  Result Date: 10/11/2022 CLINICAL DATA:  Mental status changes. EXAM: CT HEAD WITHOUT CONTRAST TECHNIQUE: Contiguous axial images were obtained from the base of the skull through the vertex without intravenous contrast. RADIATION DOSE REDUCTION: This exam was performed according to the departmental dose-optimization program which includes automated exposure control, adjustment of the mA and/or kV according to patient size and/or use of iterative reconstruction technique. COMPARISON:  06/30/2022 FINDINGS: Brain: There is no evidence for acute hemorrhage, hydrocephalus, mass lesion, or abnormal extra-axial fluid collection. No definite CT evidence for acute infarction. Patchy low attenuation in the deep hemispheric and periventricular white matter is nonspecific, but likely reflects chronic microvascular ischemic demyelination. Vascular: No hyperdense vessel or unexpected calcification. Skull: No evidence for fracture. No worrisome lytic or sclerotic lesion. Sinuses/Orbits: The visualized paranasal sinuses and mastoid air cells are clear. Visualized portions of the globes and intraorbital fat are unremarkable. Other: None IMPRESSION: 1. No acute intracranial abnormality. 2. Chronic small vessel ischemic disease. Electronically Signed   By: Kennith Center M.D.   On: 10/11/2022 06:32    Assessment/Plan 82 year old retired Engineer, civil (consulting) with a history of hypertension and recent UTI being admitted to the hospital with lethargy and concern for aspiration pneumonia.  Aspiration pneumonia-stable on room air, concern as she reportedly cough with oral intake -Inpatient admission - Discussed with pharmacy, will treat empirically with IV Rocephin and IV Flagyl -N.p.o. until nurse bedside swallow screen, or formal SLP evaluation -Aspiration precautions  Hyponatremia-most likely due to reduced oral intake, lethargy,  relative dehydration -Will hydrate for the next 24 hours with normal saline -Recheck sodium level with morning labs  Hypertension-currently normotensive, continue home metoprolol  DVT prophylaxis: Lovenox     Code Status: Full Code, discussed with the patient and her daughter at the bedside at the time of admission.  Consults called: None  Admission status: The appropriate patient status for this patient is INPATIENT. Inpatient status is judged to be reasonable and necessary in order to provide the required intensity of service to ensure the patient's safety. The patient's presenting symptoms, physical exam findings, and initial radiographic and laboratory data in the context of their chronic comorbidities is felt to place them at high risk for further clinical deterioration. Furthermore, it is not anticipated that the patient will be medically stable for discharge from the hospital within 2 midnights  of admission.    I certify that at the point of admission it is my clinical judgment that the patient will require inpatient hospital care spanning beyond 2 midnights from the point of admission due to high intensity of service, high risk for further deterioration and high frequency of surveillance required  Time spent: 53 minutes  Artavia Jeanlouis Sharlette Dense MD [facility] Pager [phone]  If 7PM-7AM, please contact night-coverage [email or web address] Password [facility]  10/11/2022, 11:34 AM

## 2022-10-11 NOTE — Progress Notes (Signed)
CCMD staff called to ask RN to contact provider to d/c ED cardiac monitoring order. RN messaged provider and requested order to be d/c'd.

## 2022-10-11 NOTE — ED Notes (Signed)
Patient used bedpan. New sheets and brief and barrier cream applied on patients buttocks and inner thighs. Patient has skin breakdown on her buttocks and inner thighs.

## 2022-10-11 NOTE — ED Notes (Signed)
Changed room to 1333 so closer to nurses station, needs to be cleaned

## 2022-10-11 NOTE — ED Notes (Signed)
Patient asked for food and drink. Dr. Jacqulyn Bath stated no, patient needs swallow evaluation first.

## 2022-10-11 NOTE — ED Triage Notes (Signed)
Pt presents to ED for evaluation of urinary difficulty.

## 2022-10-12 DIAGNOSIS — R627 Adult failure to thrive: Secondary | ICD-10-CM | POA: Diagnosis not present

## 2022-10-12 DIAGNOSIS — R531 Weakness: Secondary | ICD-10-CM

## 2022-10-12 DIAGNOSIS — J690 Pneumonitis due to inhalation of food and vomit: Secondary | ICD-10-CM

## 2022-10-12 DIAGNOSIS — B370 Candidal stomatitis: Secondary | ICD-10-CM

## 2022-10-12 LAB — BASIC METABOLIC PANEL
Anion gap: 6 (ref 5–15)
BUN: 7 mg/dL — ABNORMAL LOW (ref 8–23)
CO2: 23 mmol/L (ref 22–32)
Calcium: 8.1 mg/dL — ABNORMAL LOW (ref 8.9–10.3)
Chloride: 102 mmol/L (ref 98–111)
Creatinine, Ser: 0.65 mg/dL (ref 0.44–1.00)
GFR, Estimated: >60 mL/min (ref >60)
Glucose, Bld: 82 mg/dL (ref 70–99)
Potassium: 3.5 mmol/L (ref 3.5–5.1)
Sodium: 131 mmol/L — ABNORMAL LOW (ref 135–145)

## 2022-10-12 LAB — CULTURE, BLOOD (ROUTINE X 2)

## 2022-10-12 MED ORDER — FOOD THICKENER (SIMPLYTHICK HONEY)
1.0000 | ORAL | Status: DC | PRN
  Administered 2022-10-12: 1 via ORAL
  Filled 2022-10-12 (×2): qty 1

## 2022-10-12 MED ORDER — FLUCONAZOLE IN SODIUM CHLORIDE 200-0.9 MG/100ML-% IV SOLN
200.0000 mg | Freq: Every day | INTRAVENOUS | Status: AC
  Administered 2022-10-13 – 2022-10-25 (×13): 200 mg via INTRAVENOUS
  Filled 2022-10-12 (×13): qty 100

## 2022-10-12 MED ORDER — POLYETHYLENE GLYCOL 3350 17 G PO PACK
17.0000 g | PACK | Freq: Every day | ORAL | Status: DC | PRN
  Administered 2022-10-12: 17 g via ORAL
  Filled 2022-10-12: qty 1

## 2022-10-12 MED ORDER — FLUCONAZOLE 100MG IVPB
100.0000 mg | Freq: Every day | INTRAVENOUS | Status: DC
  Filled 2022-10-12: qty 50

## 2022-10-12 MED ORDER — MAGIC MOUTHWASH
5.0000 mL | Freq: Four times a day (QID) | ORAL | Status: DC
  Administered 2022-10-12 – 2022-10-29 (×41): 5 mL via ORAL
  Filled 2022-10-12 (×75): qty 5

## 2022-10-12 MED ORDER — FLUCONAZOLE IN SODIUM CHLORIDE 200-0.9 MG/100ML-% IV SOLN
200.0000 mg | Freq: Once | INTRAVENOUS | Status: AC
  Administered 2022-10-12: 200 mg via INTRAVENOUS
  Filled 2022-10-12: qty 100

## 2022-10-12 NOTE — Progress Notes (Signed)
  Progress Note   Patient: Barbara Nelson ZOX:[id number] DOB: July 04, 1940 DOA: 10/11/2022     1 DOS: the patient was seen and examined on 10/12/2022   Brief hospital course: 82 year old woman lives with daughter, PMH recurrent UTI since February with gradual physical decline, who presented with generalized weakness, poor oral intake, confusion, difficulty swallowing with choking and coughing.  Assessment and Plan: Aspiration pneumonia No hypoxia.  Respiratory status appears improved.   Continue empiric antibiotics.  Odynophagia Oral candidasis Suspect pharyngeal candidiasis Dysphagia Diflucan, magic mouthwash Diet per speech therapy.  FTT Generalized weakness Encourage diet.  Seen by therapy with recommendation for SNF, but plan for discharge home per family.  Hyponatremia Modest hyponatremia.  Follow-up as an outpatient.  Recurrent UTI Urinary retention? Multiple rounds of abx at home.  Continue empiric antibiotics.  No culture sent based on urinalysis.   Essential hypertension-currently normotensive, continue home metoprolol      Subjective:  Some pain with swallowing  Physical Exam: Vitals:   10/11/22 2118 10/12/22 0150 10/12/22 0512 10/12/22 1343  BP: 116/74 96/63 (!) 116/58 128/81  Pulse: 86 80 84 94  Resp: 17 17 17 16   Temp:  97.8 F (36.6 C) 97.8 F (36.6 C) (!) 97.5 F (36.4 C)  TempSrc:  Oral Oral Oral  SpO2: 96% 94% 97% 96%  Weight:      Height:       Physical Exam Vitals reviewed.  Constitutional:      General: She is not in acute distress.    Appearance: She is ill-appearing. She is not toxic-appearing.  HENT:     Mouth/Throat:     Comments: Tight with white confluent white exudate with some satellite lesions on side Cardiovascular:     Rate and Rhythm: Normal rate and regular rhythm.     Heart sounds: No murmur heard. Pulmonary:     Effort: Pulmonary effort is normal. No respiratory distress.     Breath sounds: No wheezing, rhonchi or  rales.  Neurological:     Mental Status: She is alert.  Psychiatric:     Comments: Difficult to assess mood and affect. Follows simple commands.     Data Reviewed: Admit labs noted CXR bibasilar infiltrates EKG noted  Family Communication: 2 daughters at bedside, one on phone Lives with daughter [name] Was walking with walker one month ago (seen on phone video) Needs some assistance with ADLs Spends days talking on phone and using tablet  Disposition: Status is: Inpatient Remains inpatient appropriate because: pneumonia, FTT  Planned Discharge Destination:  likely back home with daughter    Time spent: 35 minutes  Author: Brendia Sacks, MD 10/12/2022 3:22 PM  For on call review [email or web address].

## 2022-10-12 NOTE — Care Management CC44 (Signed)
Condition Code 44 Documentation Completed  Patient Details  Name: Barbara Nelson MRN: [id number] Date of Birth: 05-01-1941   Condition Code 44 given:  Yes Patient signature on Condition Code 44 notice:  Yes Documentation of 2 MD's agreement:  Yes Code 44 added to claim:  Yes    Ewing Schlein, LCSW 10/12/2022, 2:54 PM

## 2022-10-12 NOTE — Progress Notes (Signed)
Speech Language Pathology Treatment: Dysphagia  Patient Details Name: EVAMARIA GAUDIO MRN: [id number] DOB: 1940-07-13 Today's Date: 10/12/2022 Time: 4098-1191 SLP Time Calculation (min) (ACUTE ONLY): 15 min  Assessment / Plan / Recommendation Clinical Impression  Patient seen by SLP for skilled treatment session focused on dysphagia goals. Patient's daughter was in room. Patient herself was sleeping when SLP arrived and daughter said she had a rough night and did not get much sleep but typically she is very awake and alert in the mornings. SLP spent portion of session educating and demonstrating to daughter how to thicken liquids, and provided Simply Thick measuring cups for her to use. She was then able to get patient to wake up enough to take some spoonfuls of thickened water. She did exhibit suspected swallow initiation delay and delayed throat clearing as well. Of note, patient was exhibiting occasional throat clearing, cough prior to PO's and daughter has reported this has been ongoing and in absence of PO's. SLP recommending continue with current diet, only feed if patient adequately awake and alert. Will plan to proceed with MBS next date if patient able to maintain adequate alertness.    HPI HPI: LEMOYNE MCMATH is a 82 y.o. 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  Most of the history is provided by the patient's daughter, as the patient is slightly confused and quite sleepy, though she is interactive.  Daughter who is at the bedside states that patient at baseline is quite sharp, and independent.  However being back in February she was diagnosed with a urinary tract infection, and has been quite weak since that time and was just getting back to feeling like her normal self.  Came back to the  ER on the 16th of this month with concerns about bronchitis, was diagnosed with UTI and eventually prescribed antibiotics.  She never seem to get her strength back after this.  Over the last few days, patient has been slightly confused, has been very weak not eating or drinking very much.  Family also noticed that she seems to choke and cough whenever she tries to eat or drink something.  This has happened to her intermittently in the past, but is more consistent now.  Seen by SLP in February 2024 - dys1/2 and thin liquids. No MBS.      SLP Plan  Continue with current plan of care;MBS      Recommendations for follow up therapy are one component of a multi-disciplinary discharge planning process, led by the attending physician.  Recommendations may be updated based on patient status, additional functional criteria and insurance authorization.    Recommendations  Diet recommendations: Dysphagia 3 (mechanical soft);Nectar-thick liquid Liquids provided via: Cup;Teaspoon Medication Administration: Whole meds with puree Supervision: Full supervision/cueing for compensatory strategies;Trained caregiver to feed patient;Staff to assist with self feeding Compensations: Slow rate;Small sips/bites Postural Changes and/or Swallow Maneuvers: Seated upright 90 degrees                  Oral care BID   Frequent or constant Supervision/Assistance Dysphagia, oropharyngeal phase (R13.12)     Continue with current plan of care;MBS    Angela Nevin, MA, CCC-SLP Speech Therapy

## 2022-10-12 NOTE — Hospital Course (Addendum)
82 year old woman lives with daughter, PMH recurrent UTI since February with gradual physical decline, who presented with generalized weakness, poor oral intake, confusion, difficulty swallowing with choking and coughing.

## 2022-10-12 NOTE — Care Management Obs Status (Signed)
MEDICARE OBSERVATION STATUS NOTIFICATION   Patient Details  Name: Barbara Nelson MRN: [id number] Date of Birth: [birth year]   Medicare Observation Status Notification Given:  Yes    Ewing Schlein, LCSW 10/12/2022, 2:54 PM

## 2022-10-12 NOTE — Progress Notes (Signed)
10/11/22 1400  SLP Visit Information  SLP Received On 10/11/22  General Information  HPI Barbara Nelson is a 82 y.o. 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  Most of the history is provided by the patient's daughter, as the patient is slightly confused and quite sleepy, though she is interactive.  Daughter who is at the bedside states that patient at baseline is quite sharp, and independent.  However being back in February she was diagnosed with a urinary tract infection, and has been quite weak since that time and was just getting back to feeling like her normal self.  Came back to the ER on the 16th of this month with concerns about bronchitis, was diagnosed with UTI and eventually prescribed antibiotics.  She never seem to get her strength back after this.  Over the last few days, patient has been slightly confused, has been very weak not eating or drinking very much.  Family also noticed that she seems to choke and cough whenever she tries to eat or drink something.  This has happened to her intermittently in the past, but is more consistent now.  Seen by SLP in February 2024 - dys1/2 and thin liquids. No MBS.  Type of Study Bedside Swallow Evaluation  Diet Prior to this Study NPO  Temperature Spikes Noted No  Respiratory Status Room air  History of Recent Intubation No  Behavior/Cognition Alert;Cooperative;Pleasant mood  Oral Cavity Assessment Dry (lingual surface brown and dry)  Oral Care Completed by SLP No  Oral Cavity - Dentition Adequate natural dentition  Self-Feeding Abilities Needs assist  Patient Positioning Upright in bed  Baseline Vocal Quality Normal  Volitional Cough Strong  Volitional Swallow Able to elicit  Oral Motor/Sensory Function  Overall Oral Motor/Sensory Function  WFL  Thin Liquid  Thin Liquid Impaired  Presentation Cup;Straw  Pharyngeal  Phase Impairments Cough - Immediate  Nectar Thick Liquid  Nectar Thick Liquid WFL  Presentation Straw;Cup  Honey Thick Liquid  Honey Thick Liquid NT  Puree  Puree WFL  Presentation Spoon  Solid  Solid Circles Of Care  SLP Assessment  Clinical Impression Statement (ACUTE ONLY) Pt demonstrates immediate coughing with thin liquids, consistent with daughters reports of recent coughing during meals. Pt has been weaker recently, struggling with recurrent UTIs and prolonged antibiotics. She reports pain in her throat and her tongue is dry and brown. Daughter reports candidiasis in the recent past. She now has concern for pna.  Trialed nectar thick liquids without immediate coughing. Pt able to sips thick water from a straw with larger sips, less oral holding. Pt recommended to initiate a dys 3/nectar thick liquid diet for today to reduce aspiration risk while being treated. Suggest MD evaluate for potential candidiasis. Hopeful for return to thin liquids prior to d/c, will likely need MBS. Pt not a good candidate for prolonged use of thickener given risk for dehydration. Discussed with daughter. SLP to follow while admitted.  SLP Visit Diagnosis Dysphagia, oropharyngeal phase (R13.12)  Impact on safety and function Moderate aspiration risk  Other Related Risk Factors Deconditioning;Decreased management of secretions  Swallow Evaluation Recommendations  SLP Diet Recommendations Dysphagia 3 (Mech soft);Nectar-thick liquid  Liquid Administration via Cup;Straw  Medication Administration Whole meds with puree  Supervision Full supervision/cueing for compensatory strategies;Staff to assist with self feeding  Compensations Slow rate;Small sips/bites  Postural Changes Seated upright at 90 degrees  Treatment Plan  Oral Care Recommendations Oral care BID  Treatment Recommendations Therapy as outlined in treatment plan below  Follow Up  Recommendations Home health SLP  Speech Therapy Frequency (ACUTE ONLY) min 2x/week  Treatment Duration 2 weeks  Interventions Aspiration precaution training;Diet toleration management by SLP;Compensatory techniques;Patient/family education  Prognosis  Prognosis for improved oropharyngeal function Good  Individuals Consulted  Consulted and Agree with Results and Recommendations Patient;Family member/caregiver;RN  Family Member Consulted daughter  SLP Time Calculation  SLP Start Time (ACUTE ONLY) 1403  SLP Stop Time (ACUTE ONLY) 1430  SLP Time Calculation (min) (ACUTE ONLY) 27 min  SLP Evaluations  $ SLP Speech Visit 1 Visit  SLP Evaluations  $BSS Swallow 1 Procedure  $Swallowing Treatment 1 Procedure

## 2022-10-12 NOTE — Evaluation (Signed)
Physical Therapy Evaluation Patient Details Name: Barbara Nelson MRN: [id number] DOB: April 19, 1941 Today's Date: 10/12/2022  History of Present Illness  82 yo female being admitted to the hospital with weakness and malaise, recurrent UTI and aspiration pna.  PMH: HTN, multiple recent UTIs, DM, CHF  Clinical Impression  Pt admitted with above diagnosis.  Pt currently +2 total assist to transition from supine<>sit, difficulty maintain adequate level of arousal, opens eyes briefly; see below for further details. Pt may need post acute rehab. Will follow in acute setting. Defer further planning to TOC. Pt currently with functional limitations due to the deficits listed below (see PT Problem List). Pt will benefit from acute skilled PT to increase their independence and safety with mobility to allow discharge.          Recommendations for follow up therapy are one component of a multi-disciplinary discharge planning process, led by the attending physician.  Recommendations may be updated based on patient status, additional functional criteria and insurance authorization.  Follow Up Recommendations Can patient physically be transported by private vehicle: No     Assistance Recommended at Discharge Frequent or constant Supervision/Assistance  Patient can return home with the following  Two people to help with walking and/or transfers;Two people to help with bathing/dressing/bathroom;Direct supervision/assist for medications management;Help with stairs or ramp for entrance;Assistance with cooking/housework;Assist for transportation;Assistance with feeding;Direct supervision/assist for financial management    Equipment Recommendations None recommended by PT  Recommendations for Other Services       Functional Status Assessment Patient has had a recent decline in their functional status and demonstrates the ability to make significant improvements in function in a reasonable and predictable  amount of time.     Precautions / Restrictions Precautions Precautions: Fall Restrictions Weight Bearing Restrictions: No      Mobility  Bed Mobility Overal bed mobility: Needs Assistance Bed Mobility: Supine to Sit, Sit to Supine     Supine to sit: HOB elevated, +2 for physical assistance, +2 for safety/equipment, Total assist Sit to supine: Total assist, +2 for physical assistance, +2 for safety/equipment   General bed mobility comments: pt requires assist to elevate trunk, progress LEs off EOB, bed pad utilized to complete scooting to EOB; total assist to lower trunk, lift bil LEs on to bed and to scoot up in bed in supine    Transfers                   General transfer comment: deferred d/t decr level of alertness, weakness, decr ability to follow commands; d/t these reasons unsafe to transfer to chair at this time    Ambulation/Gait                  Stairs            Wheelchair Mobility    Modified Rankin (Stroke Patients Only)       Balance Overall balance assessment: Needs assistance Sitting-balance support: Feet supported, Single extremity supported Sitting balance-Leahy Scale: Poor Sitting balance - Comments: with assist, incr time and multi-modal cues pt briefly able to maintain static sitting with close supervision Postural control: Posterior lean, Right lateral lean                                   Pertinent Vitals/Pain Pain Assessment Pain Assessment: Faces Faces Pain Scale: Hurts little more Pain Location: pt unable to specify, grimaces with imposed movement  Pain Descriptors / Indicators: Grimacing Pain Intervention(s): Limited activity within patient's tolerance, Monitored during session    Home Living Family/patient expects to be discharged to:: Private residence Living Arrangements: Children Available Help at Discharge: Family;Personal care attendant Type of Home: House Home Access: Stairs to enter    [employer] of Steps: 1 Alternate Level Stairs-Number of Steps: pt stays on first level Home Layout: Multi-level;Able to live on main level with bedroom/bathroom Home Equipment: Toilet riser;Grab bars - toilet;Grab bars - tub/shower;Tub bench;Rolling Walker (2 wheels);Wheelchair - manual Additional Comments: doing HHPT and HHOT prior to admission    Prior Function Prior Level of Function : Needs assist             Mobility Comments: Pt amb with  RW  at baseline/with HHPT- less mobile recently per dtrs- since ~March 2024; goes from bed to w/c for breakfast then to liftchair ADLs Comments: requires assist with ADLs; wears depends     Hand Dominance        Extremity/Trunk Assessment   Upper Extremity Assessment Upper Extremity Assessment: Defer to OT evaluation    Lower Extremity Assessment Lower Extremity Assessment: RLE deficits/detail;LLE deficits/detail;Difficult to assess due to impaired cognition RLE Deficits / Details: PROM grossly WFL; strength testing ltd by cognition/lethargy; LLE Deficits / Details: PROM grossly WFL; strength testing ltd by cognition/lethargy;       Communication   Communication: No difficulties  Cognition Arousal/Alertness: Lethargic Behavior During Therapy: Flat affect Overall Cognitive Status: Difficult to assess Area of Impairment: Following commands                 Orientation Level: Disoriented to, Place, Time, Situation     Following Commands: Follows one step commands inconsistently     Problem Solving: Slow processing, Requires verbal cues, Requires tactile cues, Decreased initiation, Difficulty sequencing General Comments: pt opens eyes for brief periods to voice/name; unable to state where she is, unable to state dtrs name when asked, later in session calls dtr by name; pt verbal izes very littel d/t lethargy-unable to keep eyes open        General Comments General comments (skin integrity, edema, etc.): 2  dtrs present for session; dtr and PT had miscommunication reagrding "ROM", PT explained that PT does not do "PROM" typically, this lead to dtrs being displeased asking if PT was on a "timeline" with pt; explained that we were not on a "timeline", clarfied that we do A/AROM, exercises etc as pt is able to participate and that today was the assessment; we plan to progress pt as able, explained PT role in acute setting and that we are not able to tx pts daily as in rehab settings; rehab tech Jama Flavors) present throughout discussion with pt family; Pt appeared to be sleeping during conversation    Exercises General Exercises - Lower Extremity Heel Slides: PROM, Limitations Heel Slides Limitations: unable to participate d/t lethargy; dtr performed HS on R x10; PT on L x10   Assessment/Plan    PT Assessment Patient needs continued PT services  PT Problem List Decreased strength;Decreased activity tolerance;Decreased balance;Decreased mobility;Decreased cognition       PT Treatment Interventions Therapeutic exercise;DME instruction;Gait training;Functional mobility training;Therapeutic activities;Patient/family education;Balance training    PT Goals (Current goals can be found in the Care Plan section)  Acute Rehab PT Goals PT Goal Formulation: Patient unable to participate in goal setting Time For Goal Achievement: 10/26/22 Potential to Achieve Goals: Good    Frequency Min 1X/week  Co-evaluation               AM-PAC PT "6 Clicks" Mobility  Outcome Measure Help needed turning from your back to your side while in a flat bed without using bedrails?: Total Help needed moving from lying on your back to sitting on the side of a flat bed without using bedrails?: Total Help needed moving to and from a bed to a chair (including a wheelchair)?: Total Help needed standing up from a chair using your arms (e.g., wheelchair or bedside chair)?: Total Help needed to walk in hospital room?:  Total Help needed climbing 3-5 steps with a railing? : Total 6 Click Score: 6    End of Session   Activity Tolerance: Patient limited by lethargy;Patient limited by fatigue Patient left: in bed;with call bell/phone within reach;with bed alarm set;with family/visitor present Nurse Communication: Mobility status PT Visit Diagnosis: Other abnormalities of gait and mobility (R26.89);Muscle weakness (generalized) (M62.81)    Time: 1610-9604 PT Time Calculation (min) (ACUTE ONLY): 29 min   Charges:   PT Evaluation $PT Eval Moderate Complexity: 1 Mod PT Treatments $Therapeutic Activity: 8-22 mins        Delice Bison, PT  Acute Rehab Dept [facility]) [phone]  10/12/2022   [facility] 10/12/2022, 10:55 AM

## 2022-10-12 NOTE — Evaluation (Signed)
Occupational Therapy Evaluation Patient Details Name: Barbara Nelson MRN: [id number] DOB: 1940/08/26 Today's Date: 10/12/2022   History of Present Illness Patient is a 82 year old female who presented with decreased urinary output, difficulty with mobility and swallowing. Patient was admitted with aspiration pneumonia, hyponatremia, PMH: HTN, recent UTI.   Clinical Impression   Patient is a 82 year old female who was admitted for above. Patient was living at home with family support and personal caregiver but able to engage in transfers and ADLs with minimal assistance per patients daughter. Currently, patient is max A to TD for ADL tasks with physical A needed for self feeding ect. Patient was noted to have decreased functional activity tolernace, decreased ROM, decreased BUE strength, decreased endurance, decreased sitting balance, decreased standing balanced, decreased safety awareness, and decreased knowledge of AE/AD impacting participation in ADLs. Patient would continue to benefit from skilled OT services at this time while admitted and after d/c to address noted deficits in order to improve overall safety and independence in ADLs.        Recommendations for follow up therapy are one component of a multi-disciplinary discharge planning process, led by the attending physician.  Recommendations may be updated based on patient status, additional functional criteria and insurance authorization.   Assistance Recommended at Discharge Frequent or constant Supervision/Assistance  Patient can return home with the following Two people to help with walking and/or transfers;Two people to help with bathing/dressing/bathroom;Assistance with cooking/housework;Direct supervision/assist for medications management;Assist for transportation;Help with stairs or ramp for entrance;Direct supervision/assist for financial management    Functional Status Assessment  Patient has had a recent decline in their  functional status and demonstrates the ability to make significant improvements in function in a reasonable and predictable amount of time.  Equipment Recommendations  None recommended by OT       Precautions / Restrictions Precautions Precautions: Fall Restrictions Weight Bearing Restrictions: No      Mobility Bed Mobility Overal bed mobility: Needs Assistance Bed Mobility: Supine to Sit, Sit to Supine     Supine to sit: HOB elevated, +2 for physical assistance, +2 for safety/equipment, Total assist Sit to supine: Total assist, +2 for physical assistance, +2 for safety/equipment   General bed mobility comments: pt requires assist to elevate trunk, progress LEs off EOB, bed pad utilized to complete scooting to EOB; total assist to lower trunk, lift bil LEs on to bed and to scoot up in bed in supine        Balance Overall balance assessment: Needs assistance Sitting-balance support: Feet supported, Single extremity supported Sitting balance-Leahy Scale: Poor Sitting balance - Comments: with assist, incr time and multi-modal cues pt briefly able to maintain static sitting with close supervision Postural control: Posterior lean, Right lateral lean           ADL either performed or assessed with clinical judgement   ADL Overall ADL's : Needs assistance/impaired Eating/Feeding: Maximal assistance;Bed level Eating/Feeding Details (indicate cue type and reason): daughter was feeding patient this AM. noted to have note from SLP to ensure patient is awake during meal tasks. patients daughter reported that patient normally feeds herself. Grooming: Therapist, nutritional;Bed level;Min guard Grooming Details (indicate cue type and reason): with increased time and cues for throughness. Upper Body Bathing: Maximal assistance;Bed level   Lower Body Bathing: Bed level;Total assistance   Upper Body Dressing : Maximal assistance;Bed level   Lower Body Dressing: Total assistance;Bed level      Toilet Transfer Details (indicate cue type  and reason): unable to tolerate standing attempt with strong posterior leaning sitting EOB. patient was noted to have pushing repsonse to R to lie back down. Toileting- Clothing Manipulation and Hygiene: Total assistance;Bed level;+2 for physical assistance;Maximal assistance;+2 for safety/equipment Toileting - Clothing Manipulation Details (indicate cue type and reason): to roll to change pad under patient             Vision   Additional Comments: patient noted to have edema around both orbital areas with patient large bruise on R eye. patient appeared to be able to scan room to find who was talking appropirately.            Pertinent Vitals/Pain Pain Assessment Pain Assessment: Faces Faces Pain Scale: Hurts little more Pain Location: R knee Pain Descriptors / Indicators: Grimacing, Guarding Pain Intervention(s): Limited activity within patient's tolerance, Monitored during session, Repositioned     Hand Dominance Right   Extremity/Trunk Assessment Upper Extremity Assessment Upper Extremity Assessment: LUE deficits/detail;RUE deficits/detail RUE Deficits / Details: level of lethargicness impacted ability to formally assess ROM. noted to buckel with sitting RUE: Unable to fully assess due to pain LUE Deficits / Details: pushing response with this UE sitting EOB. patient lethargic impacting ability to participate in formal ROM. able to use UE functionallly to wash face. LUE: Unable to fully assess due to pain   Lower Extremity Assessment Lower Extremity Assessment: Defer to PT evaluation   Cervical / Trunk Assessment Cervical / Trunk Assessment: Kyphotic   Communication Communication Communication: No difficulties   Cognition Arousal/Alertness: Lethargic Behavior During Therapy: Flat affect Overall Cognitive Status: Difficult to assess Area of Impairment: Following commands                 Orientation Level:  Disoriented to, Place, Time, Situation     Following Commands: Follows one step commands inconsistently     Problem Solving: Slow processing, Requires verbal cues, Requires tactile cues, Decreased initiation, Difficulty sequencing General Comments: patient was lethargic during session with patient needing increased time to respond. patient noted to have daughter sister and friend visiting with patient easily overwhlemed. patient's daughter and sister were able to provide PLOF.                Home Living Family/patient expects to be discharged to:: Private residence Living Arrangements: Children Available Help at Discharge: Family;Personal care attendant Type of Home: House Home Access: Stairs to enter [employer] of Steps: 1 Entrance Stairs-Rails: Can reach both Home Layout: Multi-level;Able to live on main level with bedroom/bathroom Alternate Level Stairs-Number of Steps: pt stays on first level             Home Equipment: Toilet riser;Grab bars - toilet;Grab bars - tub/shower;Tub bench;Rolling Walker (2 wheels);Wheelchair - manual   Additional Comments: doing HHPT and HHOT prior to admission      Prior Functioning/Environment Prior Level of Function : Needs assist             Mobility Comments: Pt amb with  RW  at baseline/with HHPT- less mobile recently per dtrs- since ~March 2024; goes from bed to w/c for breakfast then to liftchair ADLs Comments: requires assist with ADLs; wears depends        OT Problem List: Decreased strength;Decreased activity tolerance;Impaired balance (sitting and/or standing);Decreased coordination;Decreased safety awareness;Decreased knowledge of precautions;Pain;Impaired UE functional use;Decreased cognition;Decreased knowledge of use of DME or AE      OT Treatment/Interventions: Self-care/ADL training;Energy conservation;Therapeutic exercise;DME and/or AE instruction;Therapeutic activities;Patient/family education;Balance  training  OT Goals(Current goals can be found in the care plan section) Acute Rehab OT Goals Patient Stated Goal: none stated OT Goal Formulation: With family Time For Goal Achievement: 10/26/22 Potential to Achieve Goals: Fair  OT Frequency: Min 2X/week       AM-PAC OT "6 Clicks" Daily Activity     Outcome Measure Help from another person eating meals?: Total Help from another person taking care of personal grooming?: Total Help from another person toileting, which includes using toliet, bedpan, or urinal?: Total Help from another person bathing (including washing, rinsing, drying)?: Total Help from another person to put on and taking off regular upper body clothing?: Total Help from another person to put on and taking off regular lower body clothing?: Total 6 Click Score: 6   End of Session Nurse Communication: Other (comment) (present in room during portion of session to provide +2. concerns over R knee that was red hot and swollen)  Activity Tolerance: Patient limited by fatigue;Patient limited by lethargy Patient left: in bed;with call bell/phone within reach;with bed alarm set;with family/visitor present  OT Visit Diagnosis: Unsteadiness on feet (R26.81);Other abnormalities of gait and mobility (R26.89);History of falling (Z91.81);Pain                Time: 1310-1354 OT Time Calculation (min): 44 min Charges:  OT General Charges $OT Visit: 1 Visit OT Evaluation $OT Eval Low Complexity: 1 Low OT Treatments $Self Care/Home Management : 23-37 mins  Rosalio Loud, MS Acute Rehabilitation Department Office# [phone]   Selinda Flavin 10/12/2022, 4:41 PM

## 2022-10-12 NOTE — TOC Initial Note (Signed)
Transition of Care [facility]) - Initial/Assessment Note   Patient Details  Name: Barbara Nelson MRN: [id number] Date of Birth: August 31, 1940  Transition of Care [facility]) CM/SW Contact:    Ewing Schlein, LCSW Phone Number: 10/12/2022, 2:57 PM  Clinical Narrative: PT evaluation recommended SNF, but patient is not eligible for SNF as she is under observation. Family aware and daughter, Ricky Stabs, is requesting that patient resume PT/OT with [facility] after discharge. TOC awaiting speech evaluation.  Expected Discharge Plan: Home w Home Health Services Barriers to Discharge: Continued Medical Work up  Patient Goals and CMS Choice Patient states their goals for this hospitalization and ongoing recovery are:: Return home with [facility] through [facility] CMS Medicare.gov Compare Post Acute Care list provided to:: Patient Choice offered to / list presented to : Patient, Adult Children  Expected Discharge Plan and Services In-house Referral: Clinical Social Work Post Acute Care Choice: Home Health Living arrangements for the past 2 months: Single Family Home           DME Arranged: N/A DME Agency: NA HH Arranged: PT, OT HH Agency: [employer]  Prior Living Arrangements/Services Living arrangements for the past 2 months: Single Family Home Lives with:: Relatives Patient language and need for interpreter reviewed:: Yes Do you feel safe going back to the place where you live?: Yes      Need for Family Participation in Patient Care: Yes (Comment) Care giver support system in place?: Yes (comment) Criminal Activity/Legal Involvement Pertinent to Current Situation/Hospitalization: No - Comment as needed  Activities of Daily Living Home Assistive Devices/Equipment: Bedside commode/3-in-1, Wheelchair, Environmental consultant (specify type), Eyeglasses, Cane (specify quad or straight) ADL Screening (condition at time of admission) Patient's cognitive ability adequate to safely complete daily  activities?: Yes Is the patient deaf or have difficulty hearing?: No Does the patient have difficulty seeing, even when wearing glasses/contacts?: No Does the patient have difficulty concentrating, remembering, or making decisions?: No Patient able to express need for assistance with ADLs?: Yes Does the patient have difficulty dressing or bathing?: Yes Independently performs ADLs?: No Communication: Independent Dressing (OT): Needs assistance Is this a change from baseline?: Pre-admission baseline Grooming: Needs assistance Is this a change from baseline?: Pre-admission baseline Feeding: Independent Bathing: Dependent Is this a change from baseline?: Pre-admission baseline Toileting: Dependent Is this a change from baseline?: Pre-admission baseline In/Out Bed: Dependent Is this a change from baseline?: Pre-admission baseline Walks in Home: Dependent Is this a change from baseline?: Pre-admission baseline Does the patient have difficulty walking or climbing stairs?: Yes Weakness of Legs: Both Weakness of Arms/Hands: Both  Permission Sought/Granted Permission sought to share information with : Other (comment) Permission granted to share information with : Yes, Verbal Permission Granted Permission granted to share info w AGENCY: [facility]  Emotional Assessment Appearance:: Appears stated age Attitude/Demeanor/Rapport: Lethargic Orientation: : Oriented to Self, Oriented to Place, Oriented to  Time, Oriented to Situation Alcohol / Substance Use: Not Applicable Psych Involvement: No (comment)  Admission diagnosis:  Confusion [R41.0] Aspiration pneumonia (HCC) [J69.0] Weakness of both lower extremities [R29.898] Decreased urine output [R34] Community acquired pneumonia, unspecified laterality [J18.9] Patient Active Problem List   Diagnosis Date Noted   Aspiration pneumonia (HCC) 10/11/2022   Urinary retention 07/27/2022   Nausea and vomiting 07/10/2022   Pressure injury of skin  07/10/2022   Unspecified atrial fibrillation (HCC) 07/01/2022   Acute metabolic encephalopathy 06/30/2022   Hypercalcemia 06/30/2022   Dehydration 06/30/2022   Acute prerenal azotemia 06/30/2022   Transaminitis  06/30/2022   Constipation 06/30/2022   DM2 (diabetes mellitus, type 2) (HCC) 06/30/2022   Allergic rhinitis 06/30/2022   Chronic diastolic CHF (congestive heart failure) (HCC) 06/30/2022   Shortness of breath 06/20/2022   Acute hypoxic respiratory failure (HCC) 06/18/2022   Hyponatremia 06/18/2022   Body mass index (BMI) 39.0-39.9, adult 01/05/2022   Decreased estrogen level 01/05/2022   Prediabetes 01/05/2022   Dry eyes 01/05/2022   History of pericarditis 01/05/2022   Essential hypertension 01/05/2022   LBBB (left bundle branch block) 09/17/2017   Benign essential HTN 10/31/2015   Heart palpitations 10/31/2015   Lung mass 06/27/2013   Edema of extremities 06/27/2013   Pericarditis, viral    History of pericardiectomy 06/19/2013   Chronic pulmonary blastomycosis (HCC) 08/23/2012   Pulmonary nodule, right 03/09/2012   Pericardial effusion/ pericarditis 10/06/2011   PCP:  [name], MD Pharmacy:   [facility] [phone] - [place], [place] - [address] [place] [address] Phone: [phone] Fax: [phone]  Social Determinants of Health (SDOH) Social History: SDOH Screenings   Food Insecurity: No Food Insecurity (10/11/2022)  Housing: Low Risk  (10/11/2022)  Transportation Needs: No Transportation Needs (10/11/2022)  Utilities: Not At Risk (10/11/2022)  Tobacco Use: Medium Risk (10/11/2022)   SDOH Interventions:    Readmission Risk Interventions     No data to display

## 2022-10-13 ENCOUNTER — Other Ambulatory Visit: Payer: Self-pay

## 2022-10-13 DIAGNOSIS — A419 Sepsis, unspecified organism: Secondary | ICD-10-CM | POA: Diagnosis present

## 2022-10-13 DIAGNOSIS — Z7189 Other specified counseling: Secondary | ICD-10-CM | POA: Diagnosis not present

## 2022-10-13 DIAGNOSIS — E119 Type 2 diabetes mellitus without complications: Secondary | ICD-10-CM | POA: Diagnosis present

## 2022-10-13 DIAGNOSIS — R410 Disorientation, unspecified: Secondary | ICD-10-CM | POA: Diagnosis present

## 2022-10-13 DIAGNOSIS — B3781 Candidal esophagitis: Secondary | ICD-10-CM | POA: Diagnosis present

## 2022-10-13 DIAGNOSIS — Z79899 Other long term (current) drug therapy: Secondary | ICD-10-CM | POA: Diagnosis not present

## 2022-10-13 DIAGNOSIS — G9340 Encephalopathy, unspecified: Secondary | ICD-10-CM

## 2022-10-13 DIAGNOSIS — Z8249 Family history of ischemic heart disease and other diseases of the circulatory system: Secondary | ICD-10-CM | POA: Diagnosis not present

## 2022-10-13 DIAGNOSIS — Z881 Allergy status to other antibiotic agents status: Secondary | ICD-10-CM | POA: Diagnosis not present

## 2022-10-13 DIAGNOSIS — J189 Pneumonia, unspecified organism: Secondary | ICD-10-CM | POA: Diagnosis not present

## 2022-10-13 DIAGNOSIS — G9341 Metabolic encephalopathy: Secondary | ICD-10-CM | POA: Diagnosis not present

## 2022-10-13 DIAGNOSIS — R627 Adult failure to thrive: Secondary | ICD-10-CM | POA: Diagnosis present

## 2022-10-13 DIAGNOSIS — J9621 Acute and chronic respiratory failure with hypoxia: Secondary | ICD-10-CM | POA: Diagnosis present

## 2022-10-13 DIAGNOSIS — E861 Hypovolemia: Secondary | ICD-10-CM | POA: Diagnosis present

## 2022-10-13 DIAGNOSIS — Z66 Do not resuscitate: Secondary | ICD-10-CM | POA: Diagnosis present

## 2022-10-13 DIAGNOSIS — R531 Weakness: Secondary | ICD-10-CM | POA: Diagnosis not present

## 2022-10-13 DIAGNOSIS — Z6832 Body mass index (BMI) 32.0-32.9, adult: Secondary | ICD-10-CM | POA: Diagnosis not present

## 2022-10-13 DIAGNOSIS — I110 Hypertensive heart disease with heart failure: Secondary | ICD-10-CM | POA: Diagnosis present

## 2022-10-13 DIAGNOSIS — R6520 Severe sepsis without septic shock: Secondary | ICD-10-CM | POA: Diagnosis present

## 2022-10-13 DIAGNOSIS — Z880 Allergy status to penicillin: Secondary | ICD-10-CM | POA: Diagnosis not present

## 2022-10-13 DIAGNOSIS — E871 Hypo-osmolality and hyponatremia: Secondary | ICD-10-CM | POA: Diagnosis present

## 2022-10-13 DIAGNOSIS — I5032 Chronic diastolic (congestive) heart failure: Secondary | ICD-10-CM | POA: Diagnosis present

## 2022-10-13 DIAGNOSIS — E876 Hypokalemia: Secondary | ICD-10-CM | POA: Diagnosis present

## 2022-10-13 DIAGNOSIS — J690 Pneumonitis due to inhalation of food and vomit: Secondary | ICD-10-CM | POA: Diagnosis present

## 2022-10-13 DIAGNOSIS — Z515 Encounter for palliative care: Secondary | ICD-10-CM | POA: Diagnosis not present

## 2022-10-13 DIAGNOSIS — E860 Dehydration: Secondary | ICD-10-CM | POA: Diagnosis present

## 2022-10-13 DIAGNOSIS — R34 Anuria and oliguria: Secondary | ICD-10-CM | POA: Diagnosis not present

## 2022-10-13 DIAGNOSIS — E43 Unspecified severe protein-calorie malnutrition: Secondary | ICD-10-CM | POA: Diagnosis present

## 2022-10-13 DIAGNOSIS — R0600 Dyspnea, unspecified: Secondary | ICD-10-CM | POA: Diagnosis not present

## 2022-10-13 DIAGNOSIS — Z87891 Personal history of nicotine dependence: Secondary | ICD-10-CM | POA: Diagnosis not present

## 2022-10-13 DIAGNOSIS — R29898 Other symptoms and signs involving the musculoskeletal system: Secondary | ICD-10-CM | POA: Diagnosis not present

## 2022-10-13 DIAGNOSIS — B370 Candidal stomatitis: Secondary | ICD-10-CM | POA: Diagnosis present

## 2022-10-13 DIAGNOSIS — E8809 Other disorders of plasma-protein metabolism, not elsewhere classified: Secondary | ICD-10-CM | POA: Diagnosis present

## 2022-10-13 LAB — BLOOD GAS, ARTERIAL
Acid-Base Excess: 2.3 mmol/L — ABNORMAL HIGH (ref 0.0–2.0)
Bicarbonate: 24.7 mmol/L (ref 20.0–28.0)
Drawn by: 560031
O2 Saturation: 95.3 %
Patient temperature: 37
pCO2 arterial: 31 mmHg — ABNORMAL LOW (ref 32–48)
pH, Arterial: 7.51 — ABNORMAL HIGH (ref 7.35–7.45)
pO2, Arterial: 64 mmHg — ABNORMAL LOW (ref 83–108)

## 2022-10-13 LAB — CULTURE, BLOOD (ROUTINE X 2)
Culture: NO GROWTH
Special Requests: ADEQUATE
Special Requests: ADEQUATE

## 2022-10-13 LAB — BASIC METABOLIC PANEL
Anion gap: 6 (ref 5–15)
BUN: 6 mg/dL — ABNORMAL LOW (ref 8–23)
CO2: 21 mmol/L — ABNORMAL LOW (ref 22–32)
Calcium: 7.8 mg/dL — ABNORMAL LOW (ref 8.9–10.3)
Chloride: 104 mmol/L (ref 98–111)
Creatinine, Ser: 0.61 mg/dL (ref 0.44–1.00)
GFR, Estimated: >60 mL/min (ref >60)
Glucose, Bld: 79 mg/dL (ref 70–99)
Potassium: 3.9 mmol/L (ref 3.5–5.1)
Sodium: 131 mmol/L — ABNORMAL LOW (ref 135–145)

## 2022-10-13 MED ORDER — LACTATED RINGERS IV SOLN: INTRAVENOUS | Status: DC

## 2022-10-13 MED ORDER — GUAIFENESIN-DM 100-10 MG/5ML PO SYRP: 5.0000 mL | ORAL_SOLUTION | ORAL | Status: DC | PRN

## 2022-10-13 MED ORDER — ACETAMINOPHEN 650 MG RE SUPP: 650.0000 mg | RECTAL | Status: DC | PRN

## 2022-10-13 MED ORDER — ACETAMINOPHEN 325 MG PO TABS
650.0000 mg | ORAL_TABLET | Freq: Four times a day (QID) | ORAL | Status: DC | PRN
  Administered 2022-10-14: 650 mg via ORAL

## 2022-10-13 MED ORDER — ACETAMINOPHEN 650 MG RE SUPP
650.0000 mg | Freq: Four times a day (QID) | RECTAL | Status: DC | PRN
  Administered 2022-10-13 – 2022-10-19 (×4): 650 mg via RECTAL
  Filled 2022-10-13 (×6): qty 1

## 2022-10-13 MED ORDER — METOPROLOL TARTRATE 5 MG/5ML IV SOLN: 1.2500 mg | Freq: Once | INTRAVENOUS | Status: DC

## 2022-10-13 NOTE — Progress Notes (Signed)
RT NOTE:  ABG obtained and sent to lab. Lab notified. 

## 2022-10-13 NOTE — Evaluation (Addendum)
SLP Cancellation Note  Patient Details Name: PANDORA BALCAZAR MRN: [id number] DOB: 11-22-1940   Cancelled treatment:       Reason Eval/Treat Not Completed: Other (comment) (pt lethargic per RN, is s/p ABG - thus will defer MBS; will reattempt next date dependent on pt's mentation).  [name], MS [facility] SLP Acute Rehab Services Office [phone]   [name] 10/13/2022, 2:32 PM

## 2022-10-13 NOTE — Progress Notes (Signed)
OT Cancellation Note  Patient Details Name: Barbara Nelson MRN: [id number] DOB: Apr 04, 1941   Cancelled Treatment:    Reason Eval/Treat Not Completed: Fatigue/lethargy limiting ability to participate Patient remains lethargic with nurse reporting change of medications, start of IV and labs drawn. Daughter in room at this time. OT to continue to follow and check back as schedule will allow.  Rosalio Loud, MS Acute Rehabilitation Department Office# [phone]  10/13/2022, 2:11 PM

## 2022-10-13 NOTE — Progress Notes (Signed)
  Progress Note   Patient: Barbara Nelson ZOX:[id number] DOB: 1941/04/23 DOA: 10/11/2022     1 DOS: the patient was seen and examined on 10/13/2022   Brief hospital course: 82 year old woman lives with daughter, PMH recurrent UTI since February with gradual physical decline, who presented with generalized weakness, poor oral intake, confusion, difficulty swallowing with choking and coughing.  Admitted for aspiration pneumonia.  Possible UTI.  History of multiple episodes of pneumonia and UTI since February.  Assessment and Plan: Aspiration pneumonia Hypoxemia No hypoxia.  Respiratory status stable.   Continue empiric antibiotics. Start 2 L nasal cannula  Acute metabolic encephalopathy versus toxic encephalopathy Detailed history obtained from family, patient's been struggling with multiple episodes of pneumonia and UTI since February.  Has had instances at home where she "sleeps half the week", has had multiple rounds of antibiotics. Differential includes acute infection, adverse drug reaction to [employer].  ABG relatively reassuring. Stop [employer].  Treat infection.  Appears to be starting this afternoon.  Monitor clinically.   Odynophagia Oral candidasis Suspect pharyngeal candidiasis Dysphagia Continue Diflucan, magic mouthwash Diet per speech therapy.  Currently patient not aware enough to participate with MBS.   FTT Generalized weakness Encourage diet when more awake.  Seen by therapy with recommendation for SNF, but plan for discharge home per family.   Hyponatremia Modest hyponatremia.  Follow-up as an outpatient.   Recurrent UTI Urinary retention? Multiple rounds of abx at home.  Continue empiric antibiotics.  No culture sent based on urinalysis.   Essential hypertension-currently normotensive, continue home metoprolol       Subjective:  More sleepy today per family Patient does not offer history  Physical Exam: Vitals:   10/12/22 0512 10/12/22  1343 10/12/22 2058 10/13/22 0436  BP: (!) 116/58 128/81 (!) 140/74 129/62  Pulse: 84 94 96 89  Resp: 17 16 17 17   Temp: 97.8 F (36.6 C) (!) 97.5 F (36.4 C) 98.1 F (36.7 C) 99.5 F (37.5 C)  TempSrc: Oral Oral Oral Oral  SpO2: 97% 96% 98% 91%  Weight:      Height:       Physical Exam Vitals reviewed.  Constitutional:      General: She is not in acute distress.    Appearance: She is not ill-appearing or toxic-appearing.     Comments: Sleeping; follows simple commands but falls immediately asleep  Cardiovascular:     Rate and Rhythm: Normal rate and regular rhythm.     Heart sounds: No murmur heard. Pulmonary:     Effort: Pulmonary effort is normal. No respiratory distress.     Breath sounds: No wheezing, rhonchi or rales.  Neurological:     Mental Status: She is alert.  Psychiatric:        Behavior: Behavior normal.     Data Reviewed: Na+ 131 stable ABG 7.51/31/64  Family Communication: one sister, one daughter (in person), 2 daughters by telephone  Disposition: Status is: Inpatient   Planned Discharge Destination: Home    Time spent: 35 minutes  Author: Brendia Sacks, MD 10/13/2022 3:41 PM  For on call review [email or web address].

## 2022-10-13 NOTE — TOC Progression Note (Signed)
Transition of Care [facility]) - Progression Note   Patient Details  Name: Barbara Nelson MRN: [id number] Date of Birth: 22-Aug-1940  Transition of [facility]) CM/SW Contact  Ewing Schlein, LCSW Phone Number: 10/13/2022, 1:18 PM  Clinical Narrative: CSW confirmed with Becky Sax with [facility] that patient can resume PT and OT at discharge as well as start speech therapy if recommended. Awaiting speech evaluation.  Expected Discharge Plan: Home w Home Health Services Barriers to Discharge: Continued Medical Work up  Expected Discharge Plan and Services In-house Referral: Clinical Social Work Post Acute Care Choice: Home Health Living arrangements for the past 2 months: Single Family Home           DME Arranged: N/A DME Agency: NA HH Arranged: PT, OT HH Agency: [employer]  Social Determinants of Health (SDOH) Interventions SDOH Screenings   Food Insecurity: No Food Insecurity (10/11/2022)  Housing: Low Risk  (10/11/2022)  Transportation Needs: No Transportation Needs (10/11/2022)  Utilities: Not At Risk (10/11/2022)  Tobacco Use: Medium Risk (10/11/2022)   Readmission Risk Interventions     No data to display

## 2022-10-13 NOTE — Plan of Care (Signed)
Family expressed concerns regarding patient's drowsiness/lethargy. Dr. Irene Limbo alerted to concern and scheduled Tessalon perrles discontinued per family request. Patient now has PRN Robitussin available. Family also desiring PRN Tylenol and this is ordered. Itching to face, chest, and shoulder concerns family for recurrence of shingles and MD is updated.  Problem: Education: Goal: Knowledge of General Education information will improve Description: Including pain rating scale, medication(s)/side effects and non-pharmacologic comfort measures Outcome: Progressing   Problem: Health Behavior/Discharge Planning: Goal: Ability to manage health-related needs will improve Outcome: Progressing   Problem: Clinical Measurements: Goal: Ability to maintain clinical measurements within normal limits will improve Outcome: Progressing   Barbara Salter, RN 10/13/22 9:49 AM

## 2022-10-14 ENCOUNTER — Inpatient Hospital Stay (HOSPITAL_COMMUNITY): Payer: Medicare Other

## 2022-10-14 DIAGNOSIS — B370 Candidal stomatitis: Secondary | ICD-10-CM | POA: Diagnosis not present

## 2022-10-14 DIAGNOSIS — R627 Adult failure to thrive: Secondary | ICD-10-CM | POA: Diagnosis not present

## 2022-10-14 DIAGNOSIS — R531 Weakness: Secondary | ICD-10-CM | POA: Diagnosis not present

## 2022-10-14 DIAGNOSIS — J690 Pneumonitis due to inhalation of food and vomit: Secondary | ICD-10-CM | POA: Diagnosis not present

## 2022-10-14 LAB — CULTURE, BLOOD (ROUTINE X 2)

## 2022-10-14 NOTE — Progress Notes (Signed)
PROGRESS NOTE    Barbara Nelson  ZOX:[id number] DOB: 18-Nov-1940 DOA: 10/11/2022 PCP: [name], MD   Brief Narrative:  82 year old woman lives with daughter, PMH recurrent UTI since February with gradual physical decline, who presented with generalized weakness, poor oral intake, confusion, difficulty swallowing with choking and coughing. Admitted for aspiration pneumonia. Possible UTI. History of multiple episodes of pneumonia and UTI since February.   Assessment & Plan:   Principal Problem:   Aspiration pneumonia (HCC) Active Problems:   Oral candidiasis   FTT (failure to thrive) in adult   Generalized weakness   Goals of Care Discussed with family goals of nutrition vs risks of aspiration Palliative to follow along for further discussion in regards to goals of care if malnutrition continues to be an issue (would not offer feeding tube in this patient at this time given our goals of care discussion this am).  Sepsis secondary to aspiration pneumonia, POA Acute hypoxic respiratory failure New hypoxia.  Respiratory status stable.   Continue empiric antibiotics. Start 2 L nasal cannula Sepsis criteria given tachypnea and leukopenia SLP following - recs: Full liquid diet/Dysphagia 1(pureed)+level 2 thickened liquids   Acute metabolic encephalopathy versus toxic encephalopathy Detailed history obtained from family, patient's been struggling with multiple episodes of pneumonia and UTI since February.  Has had instances at home where she "sleeps half the week", has had multiple rounds of antibiotics. Differential includes acute infection, adverse drug reaction to [employer].  ABG relatively reassuring. Stop [employer].  Treat infection.  Appears to be starting this afternoon.  Monitor clinically.   Odynophagia Oral candidasis Suspect pharyngeal candidiasis Dysphagia Continue Diflucan, magic mouthwash Diet per speech therapy as above (liquid diet: nectar thick;  pureed(dysphagia 1).   FTT Generalized weakness Encourage diet when more awake.  Seen by therapy with recommendation for SNF, but plan for discharge home per family.   Hyponatremia Modest hyponatremia.  Follow-up as an outpatient.   Recurrent UTI Urinary retention? Multiple rounds of abx at home.  Continue empiric antibiotics.  No culture sent based on urinalysis.  DVT prophylaxis: Lovenox Code Status: Full Family Communication: At bedside/phone  Status is: Inpt  Dispo: The patient is from: Home              Anticipated d/c is to: TBD              Anticipated d/c date is: 48-72h              Patient currently NOT medically stable for discharge  Consultants:  None  Procedures:  None  Antimicrobials:  Ceftriaxone; flagyl; fluconazole   Subjective: No acute issues/events overnight - ROS still limited by mental status  Objective: Vitals:   10/12/22 2058 10/13/22 0436 10/13/22 2204 10/14/22 0649  BP: (!) 140/74 129/62 (!) 155/81 (!) 145/83  Pulse: 96 89 79 93  Resp: 17 17 16 18   Temp: 98.1 F (36.7 C) 99.5 F (37.5 C) 98.7 F (37.1 C) 97.9 F (36.6 C)  TempSrc: Oral Oral  Oral  SpO2: 98% 91% 98% 93%  Weight:      Height:        Intake/Output Summary (Last 24 hours) at 10/14/2022 0713 Last data filed at 10/14/2022 0600 Gross per 24 hour  Intake 2425.68 ml  Output 1400 ml  Net 1025.68 ml   Filed Weights   10/11/22 0524  Weight: 90.7 kg    Examination:  General exam: Appears calm and comfortable  Respiratory system: Clear to auscultation. Respiratory  effort normal. Cardiovascular system: S1 & S2 heard, RRR. No JVD, murmurs, rubs, gallops or clicks. No pedal edema. Gastrointestinal system: Abdomen is nondistended, soft and nontender. No organomegaly or masses felt. Normal bowel sounds heard. Central nervous system: Alert and oriented. No focal neurological deficits. Extremities: Symmetric 5 x 5 power.  Data Reviewed: I have personally reviewed following  labs and imaging studies  CBC: Recent Labs  Lab 10/11/22 0606  WBC 3.9*  NEUTROABS 2.1  HGB 12.6  HCT 36.3  MCV 84.2  PLT 124*   Basic Metabolic Panel: Recent Labs  Lab 10/11/22 0606 10/12/22 0843 10/13/22 0331  NA 128* 131* 131*  K 3.3* 3.5 3.9  CL 98 102 104  CO2 25 23 21*  GLUCOSE 87 82 79  BUN 8 7* 6*  CREATININE 0.63 0.65 0.61  CALCIUM 8.3* 8.1* 7.8*   GFR: Estimated Creatinine Clearance: 61.5 mL/min (by C-G formula based on SCr of 0.61 mg/dL). Liver Function Tests: Recent Labs  Lab 10/11/22 0606  AST 50*  ALT 24  ALKPHOS 116  BILITOT 0.9  PROT 5.5*  ALBUMIN 1.7*   No results for input(s): "LIPASE", "AMYLASE" in the last 168 hours. Recent Labs  Lab 10/11/22 0606  AMMONIA 15   Coagulation Profile: No results for input(s): "INR", "PROTIME" in the last 168 hours. Cardiac Enzymes: No results for input(s): "CKTOTAL", "CKMB", "CKMBINDEX", "TROPONINI" in the last 168 hours. BNP (last 3 results) No results for input(s): "PROBNP" in the last 8760 hours. HbA1C: No results for input(s): "HGBA1C" in the last 72 hours. CBG: No results for input(s): "GLUCAP" in the last 168 hours. Lipid Profile: No results for input(s): "CHOL", "HDL", "LDLCALC", "TRIG", "CHOLHDL", "LDLDIRECT" in the last 72 hours. Thyroid Function Tests: No results for input(s): "TSH", "T4TOTAL", "FREET4", "T3FREE", "THYROIDAB" in the last 72 hours. Anemia Panel: No results for input(s): "VITAMINB12", "FOLATE", "FERRITIN", "TIBC", "IRON", "RETICCTPCT" in the last 72 hours. Sepsis Labs: Recent Labs  Lab 10/11/22 0606  LATICACIDVEN 1.2    Recent Results (from the past 240 hour(s))  Culture, blood (routine x 2)     Status: None (Preliminary result)   Collection Time: 10/11/22  9:15 AM   Specimen: Left Antecubital; Blood  Result Value Ref Range Status   Specimen Description   Final    LEFT ANTECUBITAL BLOOD Performed at [facility], [address], [place], [place] [address]     Special Requests   Final    BOTTLES DRAWN AEROBIC AND ANAEROBIC Blood Culture adequate volume Performed at [facility], [address], [place], [place] [address]    Culture   Final    NO GROWTH 2 DAYS Performed at [facility], [address] [place], [place] [address]    Report Status PENDING  Incomplete  Culture, blood (routine x 2)     Status: None (Preliminary result)   Collection Time: 10/11/22  4:54 PM   Specimen: BLOOD LEFT HAND  Result Value Ref Range Status   Specimen Description   Final    BLOOD LEFT HAND Performed at [facility], [address] [place], [place] [address]    Special Requests   Final    BOTTLES DRAWN AEROBIC AND ANAEROBIC Blood Culture adequate volume Performed at [facility], [address] [place], [place] [address]    Culture   Final    NO GROWTH 2 DAYS Performed at [facility], [address] [place], [place] [address]  Report Status PENDING  Incomplete         Radiology Studies: No results found.      Scheduled Meds:  enoxaparin (LOVENOX) injection  40 mg Subcutaneous Q24H   ketotifen  1 drop Both Eyes BID   magic mouthwash  5 mL Oral QID   metoprolol succinate  12.5 mg Oral Daily   Continuous Infusions:  cefTRIAXone (ROCEPHIN)  IV Stopped (10/13/22 1802)   fluconazole (DIFLUCAN) IV 200 mg (10/13/22 1353)   lactated ringers 125 mL/hr at 10/14/22 0706   metronidazole 500 mg (10/13/22 2219)     LOS: 2 days   Time spent:   Azucena Fallen, DO [facility]  If 7PM-7AM, please contact night-coverage [email or web address]  10/14/2022, 7:13 AM

## 2022-10-14 NOTE — Progress Notes (Signed)
Occupational Therapy Treatment Patient Details Name: Barbara Nelson MRN: [id number] DOB: 05/16/1941 Today's Date: 10/14/2022   History of present illness Patient is a 82 year old female who presented with decreased urinary output, difficulty with mobility and swallowing. Patient was admitted with aspiration pneumonia, hyponatremia, PMH: HTN, recent UTI.   OT comments  Patient was noted to report please stop when attempting to advance RLE to EOB. When asked further yes no questions. Patient reported "no" to attempting to get out of bed with patients daughter and sister present. Patient continues to be lethargic. Patients R knee continues to be red,and slightly warm with edema. Patients family was educated that therapy cannot force someone to further participate when they are asking not to. Patients family verbalized understanding. Patient's discharge plan remains appropriate at this time. OT will continue to follow acutely.     Recommendations for follow up therapy are one component of a multi-disciplinary discharge planning process, led by the attending physician.  Recommendations may be updated based on patient status, additional functional criteria and insurance authorization.    Assistance Recommended at Discharge Frequent or constant Supervision/Assistance  Patient can return home with the following  Two people to help with walking and/or transfers;Two people to help with bathing/dressing/bathroom;Assistance with cooking/housework;Direct supervision/assist for medications management;Assist for transportation;Help with stairs or ramp for entrance;Direct supervision/assist for financial management   Equipment Recommendations  None recommended by OT    Recommendations for Other Services      Precautions / Restrictions Precautions Precautions: Fall Restrictions Weight Bearing Restrictions: No       Mobility Bed Mobility Overal bed mobility: Needs Assistance Bed Mobility:  Rolling Rolling: Max assist         General bed mobility comments: to roll to L side                 ADL either performed or assessed with clinical judgement   ADL Overall ADL's : Needs assistance/impaired         General ADL Comments: patient was in room in bed at start of session. patient lethargic and not stating yes or no about getting to EOB. when BLE were slowly being advanced to EOB were half way off the bed patient reported "please dont" patients sister and daughter were present in room. patient when asked to clarify please dont what, patient indicated yes when asked if she wanted therapist to stop touching her legs. patient when asked to get up to EOB patient reported "no". patients family in room attempted to encourage patient to at least sit EOB. patient continued to decline. patient transitioned back to supine in bed with pillow to float BLE and under BUE for suport. patient's family was educated that patient had a right to refuse to move and we cannot force her to do it. patients family reported they were going to call in other members to encourage patient to participate. patient's family was educated that nursing can assist with gettin up to EOB if patient was agreeable later today. patients nurse was made aware of session.      Cognition Arousal/Alertness: Lethargic Behavior During Therapy: Flat affect Overall Cognitive Status: Difficult to assess   General Comments: patient was still lethargic but more alert than previous session. patient was able to express " please no" when legs were moved and stated clearly " no" when asked to move to EOB and get out of bed.  Pertinent Vitals/ Pain       Pain Assessment Pain Assessment: Faces Faces Pain Scale: Hurts even more Pain Location: R knee Pain Descriptors / Indicators: Grimacing, Guarding Pain Intervention(s): Limited activity within patient's tolerance, Monitored during session          Frequency  Min 2X/week        Progress Toward Goals  OT Goals(current goals can now be found in the care plan section)  Progress towards OT goals: Not progressing toward goals - comment (pain in RLE and patients decline to attempt EOB)     Plan Discharge plan remains appropriate       AM-PAC OT "6 Clicks" Daily Activity     Outcome Measure   Help from another person eating meals?: Total Help from another person taking care of personal grooming?: Total Help from another person toileting, which includes using toliet, bedpan, or urinal?: Total Help from another person bathing (including washing, rinsing, drying)?: Total Help from another person to put on and taking off regular upper body clothing?: Total Help from another person to put on and taking off regular lower body clothing?: Total 6 Click Score: 6    End of Session    OT Visit Diagnosis: Unsteadiness on feet (R26.81);Other abnormalities of gait and mobility (R26.89);History of falling (Z91.81);Pain   Activity Tolerance Patient limited by lethargy;Patient limited by pain   Patient Left in bed;with call bell/phone within reach;with bed alarm set;with family/visitor present   Nurse Communication Other (comment) (updated on patients decline to further participate in session)        Time: 2956-2130 OT Time Calculation (min): 17 min  Charges: OT General Charges $OT Visit: 1 Visit OT Treatments $Therapeutic Activity: 8-22 mins  Rosalio Loud, MS Acute Rehabilitation Department Office# [phone]   Selinda Flavin 10/14/2022, 10:25 AM

## 2022-10-14 NOTE — Progress Notes (Signed)
Modified Barium Swallow Study  Patient Details  Name: Barbara Nelson MRN: [id number] Date of Birth: 1940-10-02  Today's Date: 10/14/2022  Modified Barium Swallow completed.  Full report located under Chart Review in the Imaging Section.  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  Most of the history is provided by the patient's daughter, as the patient is slightly confused and quite sleepy, though she is interactive.  Daughter who is at the bedside states that patient at baseline is quite sharp, and independent.  However being back in February she was diagnosed with a urinary tract infection, and has been quite weak since that time and was just getting back to feeling like her normal self.  Came back to the ER on the 16th of this month with concerns about bronchitis, was diagnosed with UTI and eventually prescribed antibiotics.  She never seem to get her strength back after this.  Over the last few days, patient has been slightly confused, has been very weak not eating or drinking very much.  Family also noticed that she seems to choke and cough whenever she tries to eat or drink something.  This has happened to her intermittently in the past, but is more consistent now.  Seen by SLP in February 2024 - dys1/2 and thin liquids. No MBS.   Clinical Impression Patient presents with moderate oral and pharyngeal dysphagia with sensorimotor deficits.  Oral transiting significantly impaired with lingual pumping, poor organization and boluses spilling into pharynx off time poorly controlled.  Attempts at mastication of solids were ineffective with vertical pattern and anterior sulcus.  Patient required pudding as well as liquids to finally transit poorly  masticated solid -and this required significant effort.  Secretion retention noted in pharynx that mixed with barium retention resulting in inconsistent silent trace aspiration.  Patient had no sensation to pharyngeal retention and did not try swallow despite cues.  Thin liquid aspiration noted resulting in subtle throat clear but patient did not call adequately when she did cough on command to clear.  Various postures and counting 1-3 to help elicit swallow were not concern affect.  Currently patient is at elevated risk of malnutrition and aspiration.  Optimal diet to mitigate aspiration would be nectar liquid and very creamy pures right now that does not require mastication.  Patient was awake during eval but required frequent stimulation to maintain eye opening.  RN reports she was much more alert today than the last few days.  SLP will follow-up for family education and to ascertain indication for advancement of diet.  At this point we will maximize liquid nutrition IV ensures or Glucerna for energy conservation.  Highly recommend set up oral suction to allow suctioning the patient before and after meals due to secretion retention. Factors that may increase risk of adverse event in presence of aspiration Barbara Nelson & Barbara Nelson 2021): Weak cough;Limited mobility;Reduced cognitive function;Frail or deconditioned  Swallow Evaluation Recommendations Recommendations: PO diet PO Diet Recommendation: Full liquid diet;Dysphagia 1 (Pureed);Mildly thick liquids (Level 2, nectar thick); CREAMY PUREES~  Liquid Administration via: Cup;Spoon;Straw Medication Administration: Crushed with puree Supervision: Full supervision/cueing for swallowing strategies Swallowing strategies  : Slow rate;Small bites/sips;Follow solids with liquids;Avoid mixed consistencies (Sure patient swallows if not oral suction; Cease p.o. intake if patient clearing  her throat or coughing.) Postural changes: Stay upright 30-60 min after  meals;Position pt fully upright for meals Oral care recommendations: Oral care before PO Recommended consults: Consider Palliative care Caregiver Recommendations: Have oral suction available    Barbara Infante, MS [facility] SLP Acute Rehab Services Office [phone]   Barbara Nelson 10/14/2022,9:28 AM

## 2022-10-15 DIAGNOSIS — R627 Adult failure to thrive: Secondary | ICD-10-CM | POA: Diagnosis not present

## 2022-10-15 DIAGNOSIS — R531 Weakness: Secondary | ICD-10-CM | POA: Diagnosis not present

## 2022-10-15 DIAGNOSIS — Z7189 Other specified counseling: Secondary | ICD-10-CM

## 2022-10-15 DIAGNOSIS — Z515 Encounter for palliative care: Secondary | ICD-10-CM | POA: Diagnosis not present

## 2022-10-15 DIAGNOSIS — J690 Pneumonitis due to inhalation of food and vomit: Secondary | ICD-10-CM | POA: Diagnosis not present

## 2022-10-15 DIAGNOSIS — B370 Candidal stomatitis: Secondary | ICD-10-CM | POA: Diagnosis not present

## 2022-10-15 LAB — CBC
HCT: 33.6 % — ABNORMAL LOW (ref 36.0–46.0)
Hemoglobin: 11.6 g/dL — ABNORMAL LOW (ref 12.0–15.0)
MCH: 28.7 pg (ref 26.0–34.0)
MCHC: 34.5 g/dL (ref 30.0–36.0)
MCV: 83.2 fL (ref 80.0–100.0)
Platelets: 123 10*3/uL — ABNORMAL LOW (ref 150–400)
RBC: 4.04 MIL/uL (ref 3.87–5.11)
RDW: 16.4 % — ABNORMAL HIGH (ref 11.5–15.5)
WBC: 7.9 10*3/uL (ref 4.0–10.5)
nRBC: 0 % (ref 0.0–0.2)

## 2022-10-15 LAB — BASIC METABOLIC PANEL
Anion gap: 7 (ref 5–15)
BUN: 6 mg/dL — ABNORMAL LOW (ref 8–23)
CO2: 24 mmol/L (ref 22–32)
Calcium: 8.4 mg/dL — ABNORMAL LOW (ref 8.9–10.3)
Chloride: 105 mmol/L (ref 98–111)
Creatinine, Ser: 0.55 mg/dL (ref 0.44–1.00)
GFR, Estimated: >60 mL/min (ref >60)
Glucose, Bld: 75 mg/dL (ref 70–99)
Potassium: 3.2 mmol/L — ABNORMAL LOW (ref 3.5–5.1)
Sodium: 136 mmol/L (ref 135–145)

## 2022-10-15 LAB — CULTURE, BLOOD (ROUTINE X 2): Culture: NO GROWTH

## 2022-10-15 MED ORDER — POTASSIUM CHLORIDE 10 MEQ/100ML IV SOLN
10.0000 meq | INTRAVENOUS | Status: AC
  Administered 2022-10-15 (×4): 10 meq via INTRAVENOUS
  Filled 2022-10-15 (×4): qty 100

## 2022-10-15 MED ORDER — SORBITOL 70 % SOLN
960.0000 mL | TOPICAL_OIL | Freq: Once | ORAL | Status: AC
  Administered 2022-10-15: 960 mL via RECTAL
  Filled 2022-10-15: qty 240

## 2022-10-15 MED ORDER — FOOD THICKENER (SIMPLYTHICK)
1.0000 | ORAL | Status: DC | PRN
  Administered 2022-10-23 (×2): 1 via ORAL
  Filled 2022-10-15 (×3): qty 1

## 2022-10-15 MED ORDER — POLYETHYLENE GLYCOL 3350 17 G PO PACK
17.0000 g | PACK | Freq: Two times a day (BID) | ORAL | Status: DC
  Administered 2022-10-15 – 2022-10-30 (×10): 17 g via ORAL
  Filled 2022-10-15 (×16): qty 1

## 2022-10-15 MED ORDER — PHENYLEPHRINE-MINERAL OIL-PET 0.25-14-74.9 % RE OINT: 1.0000 | TOPICAL_OINTMENT | Freq: Two times a day (BID) | RECTAL | Status: DC | PRN

## 2022-10-15 MED ORDER — HYDROCORTISONE 1 % EX CREA
TOPICAL_CREAM | Freq: Four times a day (QID) | CUTANEOUS | Status: AC
  Filled 2022-10-15 (×2): qty 28

## 2022-10-15 MED ORDER — ACETAMINOPHEN 325 MG PO TABS
650.0000 mg | ORAL_TABLET | Freq: Three times a day (TID) | ORAL | Status: DC
  Administered 2022-10-15 – 2022-10-30 (×35): 650 mg via ORAL
  Filled 2022-10-15 (×42): qty 2

## 2022-10-15 NOTE — Progress Notes (Signed)
PROGRESS NOTE    Barbara Nelson  WUJ:[id number] DOB: November 29, 1940 DOA: 10/11/2022 PCP: [name], MD   Brief Narrative:  82 year old woman lives with daughter, PMH recurrent UTI since February with gradual physical decline, who presented with generalized weakness, poor oral intake, confusion, difficulty swallowing with choking and coughing. Admitted for aspiration pneumonia. Possible UTI. History of multiple episodes of pneumonia and UTI since February.   Assessment & Plan:   Principal Problem:   Aspiration pneumonia (HCC) Active Problems:   Oral candidiasis   FTT (failure to thrive) in adult   Generalized weakness   Goals of Care Lengthy discussion at bedside today with palliative care, discussed goals of nutrition vs risks of ongoing aspiration given patient's minimal improvement over the past few days. Palliative care following, family currently hoping to follow for additional 48 to 72 hours to evaluate for further progression versus improvement in patient's aspiration and mental status.  Sepsis secondary to aspiration pneumonia, POA Acute hypoxic respiratory failure, POA Continue empiric antibiotics x 5 days. Continue to wean oxygen as tolerated, ambulatory oxygen screen pending patient's ability to cooperate with PT Sepsis criteria given tachypnea and leukopenia SLP following - recs: Full liquid diet/Dysphagia 1(pureed)+level 2 thickened liquids   Acute metabolic encephalopathy versus toxic encephalopathy, improving Patient has been struggling with multiple episodes of pneumonia and UTI since February.  Has had instances at home where she "sleeps half the week", has had multiple rounds of antibiotics. At this point given patient's above diagnosis patient's multiple pneumonias are likely aspiration in origin   Odynophagia Oral candidasis Suspect pharyngeal candidiasis Dysphagia Continue Diflucan, magic mouthwash Diet per speech therapy as above (liquid diet: nectar  thick; pureed(dysphagia 1). Likely the source of her ongoing aspiration pneumonia/pneumonitis   FTT Generalized weakness Encourage diet when more awake.   PT recommending SNF, discussion with family indicates discharge home is their goal if possible    Hyponatremia, hypovolemic Modest hyponatremia.  Follow-up as an outpatient.   Recurrent UTI Urinary retention? Multiple rounds of abx at home.  Continue empiric antibiotics for aspiration pneumonia as above.  No culture sent based on urinalysis.  DVT prophylaxis: Lovenox Code Status: Full Family Communication: At bedside/phone  Status is: Inpt  Dispo: The patient is from: Home              Anticipated d/c is to: TBD              Anticipated d/c date is: 48-72h              Patient currently NOT medically stable for discharge  Consultants:  None  Procedures:  None  Antimicrobials:  Ceftriaxone; flagyl; fluconazole   Subjective: No acute issues/events overnight - ROS still limited by mental status but much more awake and alert today able to answer simple questions appropriately  Objective: Vitals:   10/14/22 0649 10/14/22 1413 10/14/22 2212 10/15/22 0558  BP: (!) 145/83 (!) 139/93 127/76 (!) 144/80  Pulse: 93 87 94 96  Resp: 18 17 14 15   Temp: 97.9 F (36.6 C) 98.1 F (36.7 C) 98.5 F (36.9 C) 98.1 F (36.7 C)  TempSrc: Oral Oral  Oral  SpO2: 93% 99% 100% 96%  Weight:      Height:        Intake/Output Summary (Last 24 hours) at 10/15/2022 0709 Last data filed at 10/15/2022 0200 Gross per 24 hour  Intake 1873.52 ml  Output 225 ml  Net 1648.52 ml    Filed Weights   10/11/22  0524  Weight: 90.7 kg    Examination:  General:  Pleasantly resting in bed, No acute distress. HEENT:  Normocephalic atraumatic.  Sclerae nonicteric, noninjected.  Extraocular movements intact bilaterally. Neck:  Without mass or deformity.  Trachea is midline. Lungs:  Clear to auscultate bilaterally without rhonchi, wheeze, or  rales. Heart:  Regular rate and rhythm.  Without murmurs, rubs, or gallops. Abdomen:  Soft, nontender, nondistended.  Without guarding or rebound. Extremities: Without cyanosis, clubbing, edema, or obvious deformity. Vascular:  Dorsalis pedis and posterior tibial pulses palpable bilaterally. Skin:  Warm and dry, no erythema, no ulcerations.   Data Reviewed: I have personally reviewed following labs and imaging studies  CBC: Recent Labs  Lab 10/11/22 0606 10/15/22 0341  WBC 3.9* 7.9  NEUTROABS 2.1  --   HGB 12.6 11.6*  HCT 36.3 33.6*  MCV 84.2 83.2  PLT 124* 123*    Basic Metabolic Panel: Recent Labs  Lab 10/11/22 0606 10/12/22 0843 10/13/22 0331 10/15/22 0341  NA 128* 131* 131* 136  K 3.3* 3.5 3.9 3.2*  CL 98 102 104 105  CO2 25 23 21* 24  GLUCOSE 87 82 79 75  BUN 8 7* 6* 6*  CREATININE 0.63 0.65 0.61 0.55  CALCIUM 8.3* 8.1* 7.8* 8.4*    GFR: Estimated Creatinine Clearance: 61.5 mL/min (by C-G formula based on SCr of 0.55 mg/dL). Liver Function Tests: Recent Labs  Lab 10/11/22 0606  AST 50*  ALT 24  ALKPHOS 116  BILITOT 0.9  PROT 5.5*  ALBUMIN 1.7*    No results for input(s): "LIPASE", "AMYLASE" in the last 168 hours. Recent Labs  Lab 10/11/22 0606  AMMONIA 15    Sepsis Labs: Recent Labs  Lab 10/11/22 0606  LATICACIDVEN 1.2     Recent Results (from the past 240 hour(s))  Culture, blood (routine x 2)     Status: None (Preliminary result)   Collection Time: 10/11/22  9:15 AM   Specimen: Left Antecubital; Blood  Result Value Ref Range Status   Specimen Description   Final    LEFT ANTECUBITAL BLOOD Performed at [facility], [address], [place], [place] [address]    Special Requests   Final    BOTTLES DRAWN AEROBIC AND ANAEROBIC Blood Culture adequate volume Performed at [facility], [address], [place], [place] [address]    Culture   Final    NO GROWTH 3 DAYS Performed at [facility], [address], [place], [place] [address]    Report Status PENDING  Incomplete  Culture, blood (routine x 2)     Status: None (Preliminary result)   Collection Time: 10/11/22  4:54 PM   Specimen: BLOOD LEFT HAND  Result Value Ref Range Status   Specimen Description   Final    BLOOD LEFT HAND Performed at [facility], [address] [place], [place] [address]    Special Requests   Final    BOTTLES DRAWN AEROBIC AND ANAEROBIC Blood Culture adequate volume Performed at [facility], [address] [place], [place] [address]    Culture   Final    NO GROWTH 3 DAYS Performed at [facility], [address], [place], [place] [address]    Report Status PENDING  Incomplete         Radiology Studies: DG Swallowing Func-Speech Pathology  Result Date: 10/14/2022 Table formatting from the original result was not included. Modified Barium Swallow Study Patient  Details Name: KIMYAH PEYSER MRN: [id number] Date of Birth: Jul 12, 1940 Today's Date: 10/14/2022 HPI/PMH: HPI: VISTA LEAZER is a 82 y.o. 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  Most of the history is provided by the patient's daughter, as the patient is slightly confused and quite sleepy, though she is interactive.  Daughter who is at the bedside states that patient at baseline is quite sharp, and independent.  However being back in February she was diagnosed with a urinary tract infection, and has been quite weak since that time and was just getting back to feeling like her normal self.  Came back to the ER on the 16th of this month with concerns about bronchitis, was diagnosed with UTI and eventually prescribed antibiotics.  She never seem to get her strength back after this.  Over the last few days,  patient has been slightly confused, has been very weak not eating or drinking very much.  Family also noticed that she seems to choke and cough whenever she tries to eat or drink something.  This has happened to her intermittently in the past, but is more consistent now.  Seen by SLP in February 2024 - dys1/2 and thin liquids. No MBS. Clinical Impression: Clinical Impression: Patient presents with moderate oral and pharyngeal dysphagia with sensorimotor deficits.  Oral transiting significantly impaired with lingual pumping, poor organization and boluses spilling into pharynx off time poorly controlled.  Attempts at mastication of solids were ineffective with vertical pattern and anterior sulcus.  Patient required pudding as well as liquids to finally transit poorly masticated solid -and this required significant effort.  Secretion retention noted in pharynx that mixed with barium retention resulting in inconsistent silent trace aspiration.  Patient had no sensation to pharyngeal retention and did not try swallow despite cues.  Thin liquid aspiration noted resulting in subtle throat clear but patient did not call adequately when she did cough on command to clear.  Various postures and counting 1-3 to help elicit swallow were not concern affect.  Currently patient is at elevated risk of malnutrition and aspiration.  Optimal diet to mitigate aspiration would be nectar liquid and very creamy pures right now that does not require mastication.  Patient was awake during eval but required frequent stimulation to maintain eye opening.  RN reports she was much more alert today than the last few days.  SLP will follow-up for family education and to ascertain indication for advancement of diet.  At this point we will maximize liquid nutrition IV ensures or Glucerna for energy conservation.  Highly recommend set up oral suction to allow suctioning the patient before and after meals due to secretion retention. Factors that may  increase risk of adverse event in presence of aspiration [name] & [name] 2021): Factors that may increase risk of adverse event in presence of aspiration [name] & [name] 2021): Weak cough; Limited mobility; Reduced cognitive function; Frail or deconditioned Recommendations/Plan: Swallowing Evaluation Recommendations Swallowing Evaluation Recommendations Recommendations: PO diet PO Diet Recommendation: Full liquid diet; Dysphagia 1 (Pureed); Mildly thick liquids (Level 2, nectar thick) Liquid Administration via: Cup; Spoon; Straw Medication Administration: Crushed with puree Supervision: Full supervision/cueing for swallowing strategies Swallowing strategies  : Slow rate; Small bites/sips; Follow solids with liquids; Avoid mixed consistencies (Sure patient swallows if not oral suction; Cease p.o. intake if patient clearing  her throat or coughing.) Postural changes: Stay upright 30-60 min after meals; Position pt fully upright for meals Oral care recommendations: Oral care before PO Recommended consults: Consider Palliative care Caregiver Recommendations: Have oral suction available Treatment Plan Treatment Plan Treatment recommendations: Therapy as outlined in treatment plan below Follow-up recommendations: Follow physicians's recommendations for discharge plan and follow up therapies Functional status assessment: Patient has had a recent decline in their functional status and demonstrates the ability to make significant improvements in function in a reasonable and predictable amount of time. Treatment frequency: Min 1x/week Treatment duration: 1 week Interventions: Aspiration precaution training; Compensatory techniques; Patient/family education Recommendations Recommendations for follow up therapy are one component of a multi-disciplinary discharge planning process, led by the attending physician.  Recommendations may be updated based on patient status, additional functional criteria and insurance  authorization. Assessment: Orofacial Exam: Orofacial Exam Oral Cavity: Oral Hygiene: Pooled secretions Oral Cavity - Dentition: Adequate natural dentition Orofacial Anatomy: WFL Oral Motor/Sensory Function: Generalized oral weakness Anatomy: Anatomy: Suspected cervical osteophytes Boluses Administered: Boluses Administered Boluses Administered: Thin liquids (Level 0); Mildly thick liquids (Level 2, nectar thick); Moderately thick liquids (Level 3, honey thick); Solid; Puree  Oral Impairment Domain: Oral Impairment Domain Lip Closure: No labial escape Tongue control during bolus hold: Posterior escape of greater than half of bolus Bolus preparation/mastication: Minimal chewing/mashing with majority of bolus unchewed Bolus transport/lingual motion: Repetitive/disorganized tongue motion Oral residue: Residue collection on oral structures Location of oral residue : Tongue Initiation of pharyngeal swallow : Pyriform sinuses  Pharyngeal Impairment Domain: Pharyngeal Impairment Domain Soft palate elevation: No bolus between soft palate (SP)/pharyngeal wall (PW) Laryngeal elevation: Partial superior movement of thyroid cartilage/partial approximation of arytenoids to epiglottic petiole Anterior hyoid excursion: Partial anterior movement Epiglottic movement: Complete inversion Laryngeal vestibule closure: Incomplete, narrow column air/contrast in laryngeal vestibule Pharyngeal stripping wave : Present - diminished Pharyngeal contraction (A/P view only): N/A Pharyngoesophageal segment opening: -- (difficult view due to shoulder obstruction) Tongue base retraction: Narrow column of contrast or air between tongue base and PPW Pharyngeal residue: Collection of residue within or on pharyngeal structures Location of pharyngeal residue: Tongue base; Valleculae; Pyriform sinuses; Aryepiglottic folds; Diffuse (>3 areas)  Esophageal Impairment Domain: Esophageal Impairment Domain Esophageal clearance upright position: Esophageal  retention Pill: Esophageal Impairment Domain Esophageal clearance upright position: Esophageal retention Penetration/Aspiration Scale Score: Penetration/Aspiration Scale Score 1.  Material does not enter airway: Moderately thick liquids (Level 3, honey thick); Puree; Solid 3.  Material enters airway, remains ABOVE vocal cords and not ejected out: Mildly thick liquids (Level 2, nectar thick) 8.  Material enters airway, passes BELOW cords without attempt by patient to eject out (silent aspiration) : Thin liquids (Level 0) Compensatory Strategies: Compensatory Strategies Compensatory strategies: Yes Straw: -- (inconsistently effective) Effortful swallow: -- (inability to perform) Multiple swallows: Effective (partially) Liquid wash: Effective Effective Liquid Wash: Mildly thick liquid (Level 2, nectar thick); Puree; Solid Other(comment): -- (counting to help initiate swallow, neck extenstion to push barium into pharynx)   General Information: Caregiver present: No  Diet Prior to this Study: Thin liquids (Level 0); Dysphagia 3 (mechanical soft)   Temperature : Normal   Respiratory Status: WFL   Supplemental O2: Nasal cannula   History of Recent Intubation: No  Behavior/Cognition: Alert; Cooperative; Pleasant mood Self-Feeding Abilities: Able to self-feed Baseline vocal quality/speech: Hypophonia/low volume Volitional Cough: Able to elicit Volitional Swallow: Unable to elicit Exam Limitations: Fatigue; Limited visibility Goal Planning: Prognosis for improved oropharyngeal function: Fair Barriers to Reach Goals:  Motivation; Time post onset Barriers/Prognosis Comment: Recurrent hospital admissions and severe deconditioning Patient/Family Stated Goal: none stated Consulted and agree with results and recommendations: Pt unable/family or caregiver not available Pain: Pain Assessment Pain Assessment: Faces Pain Score: 0 Faces Pain Scale: 4 Breathing: 0 Negative Vocalization: 0 Facial Expression: 0 Body Language: 0  Consolability: 0 PAINAD Score: 0 Pain Location: R knee Pain Descriptors / Indicators: Grimacing; Guarding Pain Intervention(s): Limited activity within patient's tolerance; Monitored during session; Repositioned End of Session: Start Time:SLP Start Time (ACUTE ONLY): 0830 Stop Time: SLP Stop Time (ACUTE ONLY): 0905 Time Calculation:SLP Time Calculation (min) (ACUTE ONLY): 35 min Charges: SLP Evaluations $ SLP Speech Visit: 1 Visit SLP Evaluations $BSS Swallow: 1 Procedure $MBS Swallow: 1 Procedure $Swallowing Treatment: 1 Procedure SLP visit diagnosis: SLP Visit Diagnosis: Dysphagia, oropharyngeal phase (R13.12) Past Medical History: Past Medical History: Diagnosis Date  Benign essential HTN 10/31/2015  Cardiac tamponade due to viral pericarditis   LBBB (left bundle branch block)   Lung mass   fungal infection s/p resection at [facility]  Postoperative atrial fibrillation [facility])   Pulmonary nodule  Past Surgical History: Past Surgical History: Procedure Laterality Date  ABDOMINAL HYSTERECTOMY  1988  chest tube placement  09/03/11  LUNG REMOVAL, PARTIAL    right knee arthoscopy with meniscal repair    subxiphoid pericardial window and drainage anterior partial pericardiectomy  09/03/11 [name], MS [facility] SLP Acute Rehab Services Office [phone] [name] 10/14/2022, 9:35 AM    Scheduled Meds:  enoxaparin (LOVENOX) injection  40 mg Subcutaneous Q24H   ketotifen  1 drop Both Eyes BID   magic mouthwash  5 mL Oral QID   metoprolol succinate  12.5 mg Oral Daily   Continuous Infusions:  cefTRIAXone (ROCEPHIN)  IV 2 g (10/14/22 1205)   fluconazole (DIFLUCAN) IV 200 mg (10/14/22 1501)   lactated ringers 125 mL/hr at 10/15/22 0558   metronidazole 500 mg (10/14/22 2238)     LOS: 3 days   Time spent:   Azucena Fallen, DO [facility]  If 7PM-7AM, please contact night-coverage [email or web address]  10/15/2022, 7:09 AM

## 2022-10-15 NOTE — Plan of Care (Signed)
  Problem: Education: Goal: Knowledge of General Education information will improve Description: Including pain rating scale, medication(s)/side effects and non-pharmacologic comfort measures Outcome: Progressing   Problem: Pain Managment: Goal: General experience of comfort will improve Outcome: Progressing   Problem: Safety: Goal: Ability to remain free from injury will improve Outcome: Progressing   

## 2022-10-15 NOTE — Progress Notes (Signed)
Physical Therapy Treatment Patient Details Name: Barbara Nelson MRN: [id number] DOB: 1940-12-22 Today's Date: 10/15/2022   History of Present Illness Patient is a 82 year old female who presented with decreased urinary output, difficulty with mobility and swallowing. Patient was admitted with aspiration pneumonia, hyponatremia, PMH: HTN, recent UTI.    PT Comments    Pt continues to appear lethargic but does open eyes and inconsistent with following multimodal cues.  Pt requiring total +2 assist for bed mobility and unable to remain sitting EOB without trunk support so deferred further transfer or OOB for safety.  If OOB, would recommend maximove lift equipment for pt and staff safety at this time. Family reports plan is for pt to d/c home and that they have physical assist available.  If increased assist is not available, recommend hoyer lift for home and hospital bed to decrease burden of care.     Recommendations for follow up therapy are one component of a multi-disciplinary discharge planning process, led by the attending physician.  Recommendations may be updated based on patient status, additional functional criteria and insurance authorization.  Follow Up Recommendations  Can patient physically be transported by private vehicle: No    Assistance Recommended at Discharge Frequent or constant Supervision/Assistance  Patient can return home with the following Two people to help with walking and/or transfers;Two people to help with bathing/dressing/bathroom;Direct supervision/assist for medications management;Help with stairs or ramp for entrance;Assistance with cooking/housework;Assist for transportation;Assistance with feeding;Direct supervision/assist for financial management   Equipment Recommendations  None recommended by PT    Recommendations for Other Services       Precautions / Restrictions Precautions Precautions: Fall Restrictions Weight Bearing Restrictions: No      Mobility  Bed Mobility Overal bed mobility: Needs Assistance Bed Mobility: Supine to Sit, Sit to Supine     Supine to sit: HOB elevated, +2 for physical assistance, +2 for safety/equipment, Total assist Sit to supine: Total assist, +2 for physical assistance, +2 for safety/equipment   General bed mobility comments: pt not assisting despite multimodal cues provided and time; pt was not resistant or verbalizing stopping    Transfers                   General transfer comment: deferred d/t decr level of alertness, weakness, decr ability to follow commands; d/t these reasons unsafe to transfer to chair at this time    Ambulation/Gait                   Stairs             Wheelchair Mobility    Modified Rankin (Stroke Patients Only)       Balance Overall balance assessment: Needs assistance Sitting-balance support: Feet supported, Bilateral upper extremity supported Sitting balance-Leahy Scale: Zero Sitting balance - Comments: pt was briefly able to maintain upright posture with cues and UE support however only for approx 1-2 minutes then required increased assist                                    Cognition Arousal/Alertness: Lethargic Behavior During Therapy: Flat affect Overall Cognitive Status: Difficult to assess Area of Impairment: Following commands                       Following Commands: Follows one step commands inconsistently     Problem Solving: Slow processing, Requires verbal  cues, Requires tactile cues, Difficulty sequencing, Decreased initiation General Comments: appears lethargic, cues to keep eyes open, slow to process and respond, very little verbalizations, family talking for pt        Exercises      General Comments        Pertinent Vitals/Pain Pain Assessment Pain Assessment: Faces Faces Pain Scale: Hurts even more Pain Location: bil knees R worse then L Pain Descriptors / Indicators:  Grimacing, Guarding Pain Intervention(s): Monitored during session, Repositioned    Home Living                          Prior Function            PT Goals (current goals can now be found in the care plan section) Progress towards PT goals: Progressing toward goals (slowly)    Frequency    Min 1X/week      PT Plan Current plan remains appropriate    Co-evaluation              AM-PAC PT "6 Clicks" Mobility   Outcome Measure  Help needed turning from your back to your side while in a flat bed without using bedrails?: Total Help needed moving from lying on your back to sitting on the side of a flat bed without using bedrails?: Total Help needed moving to and from a bed to a chair (including a wheelchair)?: Total Help needed standing up from a chair using your arms (e.g., wheelchair or bedside chair)?: Total Help needed to walk in hospital room?: Total Help needed climbing 3-5 steps with a railing? : Total 6 Click Score: 6    End of Session   Activity Tolerance: Patient limited by fatigue;Patient limited by lethargy Patient left: in bed;with call bell/phone within reach;with family/visitor present Nurse Communication: Mobility status PT Visit Diagnosis: Other abnormalities of gait and mobility (R26.89);Muscle weakness (generalized) (M62.81)     Time: 4782-9562 PT Time Calculation (min) (ACUTE ONLY): 20 min  Charges:  $Therapeutic Activity: 8-22 mins                    Paulino Door, DPT Physical Therapist Acute Rehabilitation Services Office: [phone]    Barbara Nelson 10/15/2022, 4:27 PM

## 2022-10-15 NOTE — Care Management Important Message (Signed)
Important Message  Patient Details IM Letter given. Name: Barbara Nelson MRN: [id number] Date of Birth: 05-13-1941   Medicare Important Message Given:  Yes     Caren Macadam 10/15/2022, 11:39 AM

## 2022-10-15 NOTE — Progress Notes (Signed)
Speech Language Pathology Treatment: Dysphagia  Patient Details Name: Barbara Nelson MRN: [id number] DOB: Nov 10, 1940 Today's Date: 10/15/2022 Time: 6387-5643 SLP Time Calculation (min) (ACUTE ONLY): 42 min  Assessment / Plan / Recommendation Clinical Impression  Pt seen for education with family re: findings of MBS, swallow precautions to mitigate aspiration and maximize nutrition.  RN in room providing pt with po medications crushed with applesauce.  SLP provided pt with nectar thick liquid for more palatable consumption.  Swallow was delayed but no indication of aspiration and pt with subtle throat clearing during aspiration on her MBS.    Pt is sleepy at this time, thus did not see her with further trials beyond a few small boluses for medication administration with RN.    Reviewed flouro loops with pt's sister and later her daughter when she arrived explaining that at times pt transited boluses into pharynx due to gravity - due to severe difficulty with orally transiting boluses -lingual pumping, etc.    Advised to have pt help feed self hand over hand as able for neurological input for assistance with pacing, airway protection. Encouraged family to feed pt only when she is fully alert and tolerating - Requested oral suction be set up for use before and after po intake.   [name] sister advised that she would not feed pt now if the food tray arrived due to her lethargy.   Discussed other risk factors for aspiration pneumonia including reliance on others for feeding, impaired mobility, etc.    Daughter inquired how to help pt with swallowing, SLP requested for her to have pt practice swallowing on command - to help with secretion management and swallow function.   Family reports pt has had issues with drooling over the last few weeks prior to admit, an indication of oral dysphagia.     Prognosis for swallow function to return to level with low aspiration and adequate nutritional, hydration yield  is guarded.   Will follow, using teach back, family agreeable to plan.  PT not alert enough to follow directions adequately at this time.      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  Most of the history is provided by the patient's daughter, as the patient is slightly confused and quite sleepy, though she is interactive.  Daughter who is at the bedside states that patient at baseline is quite sharp, and independent.  However being back in February she was diagnosed with a urinary tract infection, and has been quite weak since that time and was just getting back to feeling like her normal self.  Came back to the ER on the 16th of this month with concerns about bronchitis, was diagnosed with UTI and eventually prescribed antibiotics.  She never seem to get her strength back after this.  Over the last few days, patient has been slightly confused, has been very weak not eating or drinking very much.  Family also noticed that she seems to choke and cough whenever she tries to eat or drink something.  This has happened to her intermittently in the past, but is more consistent now.  Seen by SLP in February 2024 - dys1/2 and thin liquids. No MBS.      SLP Plan  Continue with current plan of care  Recommendations for follow up therapy are one component of a multi-disciplinary discharge planning process, led by the attending physician.  Recommendations may be updated based on patient status, additional functional criteria and insurance authorization.    Recommendations  Diet recommendations: Nectar-thick liquid;Dysphagia 1 (puree);Other(comment) (ice chips ok) Liquids provided via: Cup;Straw Medication Administration: Other (Comment) (can try whole with puree if  small) Supervision: Full supervision/cueing for compensatory strategies;Trained caregiver to feed patient;Staff to assist with self feeding Compensations: Slow rate;Small sips/bites;Other (Comment) (oral suction if pt does not swallow) Postural Changes and/or Swallow Maneuvers: Seated upright 90 degrees;Upright 30-60 min after meal                  Oral care before and after PO   Frequent or constant Supervision/Assistance Dysphagia, oropharyngeal phase (R13.12)     Continue with current plan of care    [name], MS [facility] SLP Acute Rehab Services Office [phone]  [name]  10/15/2022, 4:58 PM

## 2022-10-15 NOTE — Consult Note (Signed)
Palliative Medicine Inpatient Consult Note  Consulting Provider: Dr. Natale Milch  Reason for consult:   Palliative Care Consult Services Palliative Medicine Consult  Reason for Consult? Goals of care (full code, aspiration risk, failure to thrive)   10/15/2022  HPI:  Per intake H&P --> 82 year old woman lives with daughter, PMH recurrent UTI since February with gradual physical decline, who presented with generalized weakness, poor oral intake, confusion, difficulty swallowing with choking and coughing. Admitted for aspiration pneumonia. Possible UTI. History of multiple episodes of pneumonia and UTI since February.   Palliative care has been asked to get involved to further address goals of care in the setting of ongoing aspiration.   Clinical Assessment/Goals of Care:  *Please note that this is a verbal dictation therefore any spelling or grammatical errors are due to the "Dragon Medical One" system interpretation.  I have reviewed medical records including EPIC notes, labs and imaging, received report from bedside RN, assessed the patient who is quite sleepy lying in the bed after having eaten breakfast with the nursing technician.    I met with patient's Sister Juliette Alcide, patient's daughter [name], and we called patient's other daughter, Janett Billow on speaker phone to further discuss diagnosis prognosis, GOC, EOL wishes, disposition and options.   I introduced Palliative Medicine as specialized medical care for people living with serious illness. It focuses on providing relief from the symptoms and stress of a serious illness. The goal is to improve quality of life for both the patient and the family.  Medical History Review and Understanding:  A review of Lanyla's past medical history inclusive of her hypertension, recurrent urinary tract infections, and pneumonia(s) was completed.  Social History:  Lyly lives in [place].  She is a widow.  She has 3 daughters  -[name], Nicki [place], and [place].  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  She is identified as someone who used to love to travel and lived abroad in [place].  She and her daughter went to [place] about a year ago at this time.  She is a woman of faith and practices within the [facility] denomination.  Functional and Nutritional State:  Preceding hospitalization Crystel had been living with her daughter [name] and was for the most part coming back from multiple months of acute on chronic illnesses.  She was able to mobilize with a walker and a gait belt.  Her daughter did help with things such as dressing, bathing, meal preparation.  Usha did have a decent appetite though per her family there were recurrent issues with coughing after ingestion.  Palliative Symptoms:  Aspiration pneumonia in the setting of dysphagia. Continues to work with speech therapy. Work on good pulmonary toileting as well.   Constipation - had an SBO a few months ago. Laxative ordered by primary team in an effort to clean patient out from the top and bottom.  Pain in the setting of arthritis - tylenol per patients family does appear to work well.   Weakness due to immobility. Per family patient gets up daily at home and spends the majority of her day in her wheelchair and/or lift chair.   Advance Directives:  A detailed discussion was had today regarding advanced directives.  Patient's 3 daughters asked to make decisions and her honor.  Code Status:  Concepts specific to code status, artifical feeding and hydration, continued IV antibiotics and rehospitalization was had.  The difference between a aggressive medical intervention path  and a  palliative comfort care path for this patient at this time was had.   Encouraged patient/family to consider DNR/DNI status understanding evidenced based poor outcomes in similar hospitalized patient, as the cause of arrest is likely associated with  advanced chronic/terminal illness rather than an easily reversible acute cardio-pulmonary event. I explained that DNR/DNI does not change the medical plan and it only comes into effect after a person has arrested (died).  It is a protective measure to keep [place] from harming the patient in their last moments of life.   Cristabel's daughters would like to talk more about resuscitative efforts.  Her daughter Janett Billow shares that they are a family of faith and had seen their mother come back over the last few months from some fairly difficult situations therefore they do not want to hold the efforts being made to improve her present condition.  They have requested some time and Delorise Shiner for further conversation.  Provided both a MOST form and the hard choices for loving people book for further review and consideration.  Discussion:  Patient's sister reviews with me that she had been doing well up until December 2023 when she was afflicted by shingles infection.  She then got placed on gabapentin and had a difficult 2 weeks provide she was horrifically lethargic.  This was then tapered off and she started to improve though then had a fall at the side of her bed causing injury.  Since then unfortunately it has been a cycle in and out of the hospital with a variety of diagnoses inclusive of dehydration, bowel obstruction, and urinary infections.  Dr. Natale Milch entered the room during our conversations and was able to add some insight in terms of patient's ongoing aspirational events.  He shared that per his review with the speech therapist Janiecia does not seem to have a coordination issue though does have weakened muscles which are causing food and fluid to go down into her lungs causing aspiration pneumonitis and aspiration pneumonia.  We reviewed that unfortunately this is not something that is correctable with anything other than aggressive speech therapy and for some individuals it is never repairable.  We further  reviewed that even saliva secretion can ultimately end up being aspirated.  Dr. Natale Milch emphasized the importance of family conversations to determine how far and medical care which will and what her limitations will be.  Best case and worst-case scenarios were reviewed best case that Namiyah continues to improve and hopefully pecans as functional as she was preceding this hospitalization.  Worst case was reviewed and that heart rate could and has a high likelihood of continued aspirational events ultimately leading to precipitous decline.  Patient's family were very understanding to the information provided and are hopeful all efforts at this time can be made to improve her present situation.  Patient's family are hopeful that she will improve and there was enthesis set towards mobility efforts as well as mobilizing her pulmonary secretions.  Discussed the importance of continued conversation with family and their  medical providers regarding overall plan of care and treatment options, ensuring decisions are within the context of the patients values and GOCs.  Decision Maker: Ricky Stabs (Daughter): [phone] (Mobile)   SUMMARY OF RECOMMENDATIONS   FULL CODE - Patients family would like to discuss this among themselves  Open and honest conversations were held in the setting of patient illness(s)  Patients family remain hopeful for improvements  and are allowing time for outcomes  Ongoing PMT support  Code Status/Advance Care  Planning: FULL CODE   Symptom Management:  Aspiration Pneumonia: - Speech Therapy - OOB daily - Good pulm toileting --> have requested RT for chest PT  Constipation: - Sorbitol enema  Arthritic Pain: - Tylenol 650mg  PO TID --> Crush in applesauce  Muscular deconditioning: - PT/OT - Mobility aid daily  Palliative Prophylaxis:  Aspiration, Bowel Regimen, Delirium Protocol, Frequent Pain Assessment, Oral Care, Palliative Wound Care, and Turn  Reposition  Additional Recommendations (Limitations, Scope, Preferences): Continue current care  Psycho-social/Spiritual:  Desire for further Chaplaincy support: Yes - [employer] Additional Recommendations: Education on chronic disease burden and recurrent aspirational events   Prognosis: Worrisome in the setting of precipitous decline over the past six months. High 12 months mortality risk.  Discharge Planning: Discharge plan is uncertain at this time.   Vitals:   10/14/22 2212 10/15/22 0558  BP: 127/76 (!) 144/80  Pulse: 94 96  Resp: 14 15  Temp: 98.5 F (36.9 C) 98.1 F (36.7 C)  SpO2: 100% 96%    Intake/Output Summary (Last 24 hours) at 10/15/2022 0716 Last data filed at 10/15/2022 0200 Gross per 24 hour  Intake 1873.52 ml  Output 225 ml  Net 1648.52 ml   Last Weight  Most recent update: 10/11/2022  5:24 AM    Weight  90.7 kg (199 lb 15.3 oz)            Gen:  Elderly AA F in NAD HEENT: moist mucous membranes CV: Regular rate and rhythm  PULM: On 2LPM [employer] breathing is even and nonlabored ABD: soft/nontender  EXT: (+) moderate edema Neuro: Alert and oriented to self  PPS: 20-30%   This conversation/these recommendations were discussed with patient primary care team, Dr. Natale Milch  Total Time: 53 Billing based on MDM: High  Problems Addressed: One acute or chronic illness or injury that poses a threat to life or bodily function  Amount and/or Complexity of Data: Category 3:Discussion of management or test interpretation with external physician/other qualified health care professional/appropriate source (not separately reported)  Risks: Decision regarding hospitalization or escalation of hospital care ______________________________________________________ Lamarr Lulas [facility] Palliative Medicine Team Team Cell Phone: [phone] Please utilize secure chat with additional questions, if there is no response within 30 minutes please call the above phone  number  Palliative Medicine Team providers are available by phone from 7am to 7pm daily and can be reached through the team cell phone.  Should this patient require assistance outside of these hours, please call the patient's attending physician.

## 2022-10-15 NOTE — Progress Notes (Signed)
Chaplain engaged in an initial visit with Barbara Nelson and her family. Chaplain was able to meet Barbara Nelson's daughter, sister, and nephew. Chaplain offered prayer over Barbara Nelson and future support if needed.

## 2022-10-16 DIAGNOSIS — Z515 Encounter for palliative care: Secondary | ICD-10-CM | POA: Diagnosis not present

## 2022-10-16 MED ORDER — POTASSIUM CHLORIDE CRYS ER 20 MEQ PO TBCR
40.0000 meq | EXTENDED_RELEASE_TABLET | Freq: Once | ORAL | Status: DC
  Filled 2022-10-16: qty 2

## 2022-10-16 MED ORDER — POTASSIUM CHLORIDE 10 MEQ/100ML IV SOLN
10.0000 meq | INTRAVENOUS | Status: AC
  Administered 2022-10-16 (×2): 10 meq via INTRAVENOUS
  Filled 2022-10-16 (×2): qty 100

## 2022-10-16 NOTE — Progress Notes (Addendum)
   Palliative Medicine Inpatient Follow Up Note HPI: 82 year old woman lives with daughter, PMH recurrent UTI since February with gradual physical decline, who presented with generalized weakness, poor oral intake, confusion, difficulty swallowing with choking and coughing. Admitted for aspiration pneumonia. Possible UTI. History of multiple episodes of pneumonia and UTI since February.    Palliative care has been asked to get involved to further address goals of care in the setting of ongoing aspiration.  Today's Discussion 10/16/2022  *Please note that this is a verbal dictation therefore any spelling or grammatical errors are due to the "Dragon Medical One" system interpretation.  Chart reviewed inclusive of vital signs, progress notes, laboratory results, and diagnostic images. It appears patient consistently getting roughly 25% of meals with 1:1 feeding.  I met at bedside with patients Barbara Nelson, patient's daughter Barbara Nelson. We discussed that they had met with speech therapy who were able to help in terms of understanding patients impaired swallowing. They got to see the images from the swallow study as well. Discussed that patient did sit on the side of the bed yesterday for a period of time with PT.   Created space and opportunity for patients family to explore thoughts feelings and fears regarding  Barbara Nelson's current medical situation. They express ongoing hope for improvements.   I did speak to Barbara Nelson who was somnolent though arousable. She denies discomfort at the time.   Questions and concerns addressed/Palliative Support Provided.   Objective Assessment: Vital Signs Vitals:   10/16/22 0635 10/16/22 0652  BP: (!) 134/98   Pulse: 82   Resp: 18 16  Temp: 98.1 F (36.7 C)   SpO2: 100%     Intake/Output Summary (Last 24 hours) at 10/16/2022 0948 Last data filed at 10/16/2022 0600 Gross per 24 hour  Intake 2565.13 ml  Output 550 ml  Net 2015.13 ml   Last Weight  Most recent  update: 10/11/2022  5:24 AM    Weight  90.7 kg (199 lb 15.3 oz)            Gen:  Elderly AA F in NAD HEENT: moist mucous membranes CV: Regular rate and rhythm  PULM: On 2LPM [place] breathing is even and nonlabored ABD: soft/nontender  EXT: (+) moderate edema Neuro: Somnolent though arousable  SUMMARY OF RECOMMENDATIONS   FULL CODE - Patients family would like to discuss this among themselves there are multiple family members who work in healthcare who plan to visit throughout the weekend    Patients family remain hopeful for improvements  and are allowing time for outcomes   Ongoing PMT support  Symptom Management:  Aspiration Pneumonia: - Speech Therapy - 1:1 assisted feeding - Upright for all meals - Good pulm toileting --> have requested RT for chest PT getting Q4H vest treatments   Constipation: - Sorbitol enema   Arthritic Pain: - Tylenol 650mg  PO TID --> Crush in applesauce   Muscular deconditioning: - PT/OT - Mobility aid daily  Billing based on MDM: High - Ongoing complex GOC conversations regarding escalation/de-escalation of care ______________________________________________________________________________________ Lamarr Lulas [place] Palliative Medicine Team Team Cell Phone: [phone] Please utilize secure chat with additional questions, if there is no response within 30 minutes please call the above phone number  Palliative Medicine Team providers are available by phone from 7am to 7pm daily and can be reached through the team cell phone.  Should this patient require assistance outside of these hours, please call the patient's attending physician.

## 2022-10-16 NOTE — Progress Notes (Signed)
   10/16/22 2330  Chest Physiotherapy Tx  CPT Delivery Source (S)   (CPT not done at this time. Family refused, Pt is asleep. RT will try again at next rounds.)

## 2022-10-16 NOTE — Progress Notes (Signed)
   10/16/22 2013  Chest Physiotherapy Tx  CPT Delivery Source Chest vest  $ Expiratory Vibration Device Administration  Subsequent  CPT Duration 10  CPT Chest Site Full range  Post-Treatment Respirations 19  Cough None  Sputum Amount None  Sputum Color Other (Comment) (none)  Sputum Consistency Other (Comment) (none)  Sputum Specimen Source Spontaneous cough  Position Supine  CPT Treatment Tolerance Tolerated well  Post-Treatment Pulse 82  Cough Upon request;Occasional  Respiratory  Bilateral Breath Sounds Diminished  R Upper  Breath Sounds Diminished  L Upper Breath Sounds Diminished  R Lower Breath Sounds Diminished  L Lower Breath Sounds Diminished

## 2022-10-16 NOTE — Progress Notes (Signed)
PROGRESS NOTE    Barbara Nelson  ZOX:[id number] DOB: [birth year] DOA: 10/11/2022 PCP: [name], MD   Brief Narrative:  82 year old woman lives with daughter, PMH recurrent UTI since February with gradual physical decline, who presented with generalized weakness, poor oral intake, confusion, difficulty swallowing with choking and coughing. Admitted for aspiration pneumonia. Possible UTI. History of multiple episodes of pneumonia and UTI since February.   Assessment & Plan:   Principal Problem:   Aspiration pneumonia (HCC) Active Problems:   Oral candidiasis   FTT (failure to thrive) in adult   Generalized weakness  Goals of Care Lengthy discussion at bedside daily with family Patient's p.o. intake continues to be somewhat poor, urine output diminishing and appears dark in color despite IV fluids Palliative care following, family currently hoping to follow for additional 48 to 72 hours to evaluate for further progression versus improvement in patient's aspiration and mental status.  Sepsis secondary to aspiration pneumonia, POA Acute hypoxic respiratory failure, POA Completed empiric antibiotics Continue to wean oxygen as tolerated, ambulatory oxygen screen pending patient's ability to cooperate with PT/ongoing weakness/encephalopathy Sepsis criteria given tachypnea and leukopenia SLP following - recs: Full liquid diet/Dysphagia 1(pureed)+level 2 thickened liquids   Acute metabolic encephalopathy versus toxic encephalopathy, improving Patient has been struggling with multiple episodes of pneumonia and UTI since February.   Has had instances at home where she "sleeps half the week", has had multiple rounds of antibiotics. At this point given patient's above diagnosis patient's multiple pneumonias are likely aspiration in origin   Odynophagia Oral candidasis Suspect pharyngeal candidiasis Dysphagia Continue Diflucan, magic mouthwash Diet per speech therapy as above  (liquid diet: nectar thick; pureed(dysphagia 1). Likely the source of her ongoing aspiration pneumonia/pneumonitis   FTT Generalized weakness Encourage diet when more awake.   PT recommending SNF, discussion with family indicates discharge home is their goal if possible    Hyponatremia, hypovolemic Hypokalemia Ongoing in the setting of poor p.o. intake and dehydration IV fluids ongoing   Recurrent UTI Urinary retention? Multiple rounds of abx at home.  Completed empiric antibiotics for aspiration pneumonia as above.  No culture sent based on urinalysis.  DVT prophylaxis: Lovenox Code Status: Full Family Communication: At bedside  Status is: Inpt  Dispo: The patient is from: Home              Anticipated d/c is to: TBD              Anticipated d/c date is: 48-72h              Patient currently NOT medically stable for discharge  Consultants:  None  Procedures:  None  Antimicrobials:  Ceftriaxone; flagyl - completed Fluconazole -ongoing  Subjective: No acute issues/events overnight -patient minimally interactive and responsive today, tolerating breakfast quite poorly, possibly 1-2 bites of food despite multiple attempts.  Difficulty taking p.o. crushed meds as well.  Review of systems limited.  Objective: Vitals:   10/15/22 1353 10/15/22 2117 10/16/22 0635 10/16/22 0652  BP: (!) 147/86 (!) 151/104 (!) 134/98   Pulse: 97 95 82   Resp: 20 20 18 16   Temp: 98.8 F (37.1 C) (!) 97.4 F (36.3 C) 98.1 F (36.7 C)   TempSrc:      SpO2: 98% 98% 100%   Weight:      Height:        Intake/Output Summary (Last 24 hours) at 10/16/2022 0754 Last data filed at 10/16/2022 0600 Gross per 24 hour  Intake 2565.13 ml  Output 550 ml  Net 2015.13 ml    Filed Weights   10/11/22 0524  Weight: 90.7 kg    Examination:  General:  Pleasantly resting in bed, No acute distress.  Poorly interactive but able to follow simple commands once fully awake. HEENT:  Normocephalic  atraumatic.  Sclerae nonicteric, noninjected.  Extraocular movements intact bilaterally. Neck:  Without mass or deformity.  Trachea is midline. Lungs: Scant bilateral rhonchi without overt rales or wheezes. Heart:  Regular rate and rhythm.  Without murmurs, rubs, or gallops. Abdomen:  Soft, nontender, nondistended.  Without guarding or rebound. Extremities: Without cyanosis, clubbing, edema, or obvious deformity. Vascular:  Dorsalis pedis and posterior tibial pulses palpable bilaterally. Skin:  Warm and dry, no erythema, no ulcerations.   Data Reviewed: I have personally reviewed following labs and imaging studies  CBC: Recent Labs  Lab 10/11/22 0606 10/15/22 0341  WBC 3.9* 7.9  NEUTROABS 2.1  --   HGB 12.6 11.6*  HCT 36.3 33.6*  MCV 84.2 83.2  PLT 124* 123*    Basic Metabolic Panel: Recent Labs  Lab 10/11/22 0606 10/12/22 0843 10/13/22 0331 10/15/22 0341  NA 128* 131* 131* 136  K 3.3* 3.5 3.9 3.2*  CL 98 102 104 105  CO2 25 23 21* 24  GLUCOSE 87 82 79 75  BUN 8 7* 6* 6*  CREATININE 0.63 0.65 0.61 0.55  CALCIUM 8.3* 8.1* 7.8* 8.4*    GFR: Estimated Creatinine Barbara: 61.5 mL/min (by C-G formula based on SCr of 0.55 mg/dL). Liver Function Tests: Recent Labs  Lab 10/11/22 0606  AST 50*  ALT 24  ALKPHOS 116  BILITOT 0.9  PROT 5.5*  ALBUMIN 1.7*    No results for input(s): "LIPASE", "AMYLASE" in the last 168 hours. Recent Labs  Lab 10/11/22 0606  AMMONIA 15    Sepsis Labs: Recent Labs  Lab 10/11/22 0606  LATICACIDVEN 1.2     Recent Results (from the past 240 hour(s))  Culture, blood (routine x 2)     Status: None (Preliminary result)   Collection Time: 10/11/22  9:15 AM   Specimen: Left Antecubital; Blood  Result Value Ref Range Status   Specimen Description   Final    LEFT ANTECUBITAL BLOOD Performed at [facility], [address] [place], [place] [address]    Special Requests   Final    BOTTLES DRAWN AEROBIC AND ANAEROBIC Blood  Culture adequate volume Performed at [facility], [address], [place], [place] [address]    Culture   Final    NO GROWTH 4 DAYS Performed at [facility], [address], [place], [place] [address]    Report Status PENDING  Incomplete  Culture, blood (routine x 2)     Status: None (Preliminary result)   Collection Time: 10/11/22  4:54 PM   Specimen: BLOOD LEFT HAND  Result Value Ref Range Status   Specimen Description   Final    BLOOD LEFT HAND Performed at [facility], [address], [place], [place] [address]    Special Requests   Final    BOTTLES DRAWN AEROBIC AND ANAEROBIC Blood Culture adequate volume Performed at [facility], [address], [place], [place] [address]    Culture   Final    NO GROWTH 4 DAYS Performed at [facility], [address] [place], [place] [address]    Report Status PENDING  Incomplete  Radiology Studies: DG Swallowing Func-Speech Pathology  Result Date: 10/14/2022 Table formatting from the original result was not included. Modified Barium Swallow Study Patient Details Name: Barbara Nelson MRN: [id number] Date of Birth: 06-11-1940 Today's Date: 10/14/2022 HPI/PMH: HPI: Barbara Nelson is a 82 y.o. 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  Most of the history is provided by the patient's daughter, as the patient is slightly confused and quite sleepy, though she is interactive.  Daughter who is at the bedside states that patient at baseline is quite sharp, and independent.  However being back in February she was diagnosed with a urinary tract infection, and has been quite weak since that time and was just getting back to feeling like her normal self.  Came back to the ER  on the 16th of this month with concerns about bronchitis, was diagnosed with UTI and eventually prescribed antibiotics.  She never seem to get her strength back after this.  Over the last few days, patient has been slightly confused, has been very weak not eating or drinking very much.  Family also noticed that she seems to choke and cough whenever she tries to eat or drink something.  This has happened to her intermittently in the past, but is more consistent now.  Seen by SLP in February 2024 - dys1/2 and thin liquids. No MBS. Clinical Impression: Clinical Impression: Patient presents with moderate oral and pharyngeal dysphagia with sensorimotor deficits.  Oral transiting significantly impaired with lingual pumping, poor organization and boluses spilling into pharynx off time poorly controlled.  Attempts at mastication of solids were ineffective with vertical pattern and anterior sulcus.  Patient required pudding as well as liquids to finally transit poorly masticated solid -and this required significant effort.  Secretion retention noted in pharynx that mixed with barium retention resulting in inconsistent silent trace aspiration.  Patient had no sensation to pharyngeal retention and did not try swallow despite cues.  Thin liquid aspiration noted resulting in subtle throat clear but patient did not call adequately when she did cough on command to clear.  Various postures and counting 1-3 to help elicit swallow were not concern affect.  Currently patient is at elevated risk of malnutrition and aspiration.  Optimal diet to mitigate aspiration would be nectar liquid and very creamy pures right now that does not require mastication.  Patient was awake during eval but required frequent stimulation to maintain eye opening.  RN reports she was much more alert today than the last few days.  SLP will follow-up for family education and to ascertain indication for advancement of diet.  At this point we will maximize liquid  nutrition IV ensures or Glucerna for energy conservation.  Highly recommend set up oral suction to allow suctioning the patient before and after meals due to secretion retention. Factors that may increase risk of adverse event in presence of aspiration Barbara Nelson & Barbara Nelson 2021): Factors that may increase risk of adverse event in presence of aspiration Barbara Nelson & Barbara Nelson 2021): Weak cough; Limited mobility; Reduced cognitive function; Frail or deconditioned Recommendations/Plan: Swallowing Evaluation Recommendations Swallowing Evaluation Recommendations Recommendations: PO diet PO Diet Recommendation: Full liquid diet; Dysphagia 1 (Pureed); Mildly thick liquids (Level 2, nectar thick) Liquid Administration via: Cup; Spoon; Straw Medication Administration: Crushed with puree Supervision: Full supervision/cueing for swallowing strategies Swallowing strategies  :  Slow rate; Small bites/sips; Follow solids with liquids; Avoid mixed consistencies (Sure patient swallows if not oral suction; Cease p.o. intake if patient clearing her throat or coughing.) Postural changes: Stay upright 30-60 min after meals; Position pt fully upright for meals Oral care recommendations: Oral care before PO Recommended consults: Consider Palliative care Caregiver Recommendations: Have oral suction available Treatment Plan Treatment Plan Treatment recommendations: Therapy as outlined in treatment plan below Follow-up recommendations: Follow physicians's recommendations for discharge plan and follow up therapies Functional status assessment: Patient has had a recent decline in their functional status and demonstrates the ability to make significant improvements in function in a reasonable and predictable amount of time. Treatment frequency: Min 1x/week Treatment duration: 1 week Interventions: Aspiration precaution training; Compensatory techniques; Patient/family education Recommendations Recommendations for follow up therapy are one component of  a multi-disciplinary discharge planning process, led by the attending physician.  Recommendations may be updated based on patient status, additional functional criteria and insurance authorization. Assessment: Orofacial Exam: Orofacial Exam Oral Cavity: Oral Hygiene: Pooled secretions Oral Cavity - Dentition: Adequate natural dentition Orofacial Anatomy: WFL Oral Motor/Sensory Function: Generalized oral weakness Anatomy: Anatomy: Suspected cervical osteophytes Boluses Administered: Boluses Administered Boluses Administered: Thin liquids (Level 0); Mildly thick liquids (Level 2, nectar thick); Moderately thick liquids (Level 3, honey thick); Solid; Puree  Oral Impairment Domain: Oral Impairment Domain Lip Closure: No labial escape Tongue control during bolus hold: Posterior escape of greater than half of bolus Bolus preparation/mastication: Minimal chewing/mashing with majority of bolus unchewed Bolus transport/lingual motion: Repetitive/disorganized tongue motion Oral residue: Residue collection on oral structures Location of oral residue : Tongue Initiation of pharyngeal swallow : Pyriform sinuses  Pharyngeal Impairment Domain: Pharyngeal Impairment Domain Soft palate elevation: No bolus between soft palate (SP)/pharyngeal wall (PW) Laryngeal elevation: Partial superior movement of thyroid cartilage/partial approximation of arytenoids to epiglottic petiole Anterior hyoid excursion: Partial anterior movement Epiglottic movement: Complete inversion Laryngeal vestibule closure: Incomplete, narrow column air/contrast in laryngeal vestibule Pharyngeal stripping wave : Present - diminished Pharyngeal contraction (A/P view only): N/A Pharyngoesophageal segment opening: -- (difficult view due to shoulder obstruction) Tongue base retraction: Narrow column of contrast or air between tongue base and PPW Pharyngeal residue: Collection of residue within or on pharyngeal structures Location of pharyngeal residue: Tongue base;  Valleculae; Pyriform sinuses; Aryepiglottic folds; Diffuse (>3 areas)  Esophageal Impairment Domain: Esophageal Impairment Domain Esophageal Barbara upright position: Esophageal retention Pill: Esophageal Impairment Domain Esophageal Barbara upright position: Esophageal retention Penetration/Aspiration Scale Score: Penetration/Aspiration Scale Score 1.  Material does not enter airway: Moderately thick liquids (Level 3, honey thick); Puree; Solid 3.  Material enters airway, remains ABOVE vocal cords and not ejected out: Mildly thick liquids (Level 2, nectar thick) 8.  Material enters airway, passes BELOW cords without attempt by patient to eject out (silent aspiration) : Thin liquids (Level 0) Compensatory Strategies: Compensatory Strategies Compensatory strategies: Yes Straw: -- (inconsistently effective) Effortful swallow: -- (inability to perform) Multiple swallows: Effective (partially) Liquid wash: Effective Effective Liquid Wash: Mildly thick liquid (Level 2, nectar thick); Puree; Solid Other(comment): -- (counting to help initiate swallow, neck extenstion to push barium into pharynx)   General Information: Caregiver present: No  Diet Prior to this Study: Thin liquids (Level 0); Dysphagia 3 (mechanical soft)   Temperature : Normal   Respiratory Status: WFL   Supplemental O2: Nasal cannula   History of Recent Intubation: No  Behavior/Cognition: Alert; Cooperative; Pleasant mood Self-Feeding Abilities: Able to self-feed Baseline vocal quality/speech: Hypophonia/low volume Volitional Cough: Able  to elicit Volitional Swallow: Unable to elicit Exam Limitations: Fatigue; Limited visibility Goal Planning: Prognosis for improved oropharyngeal function: Fair Barriers to Reach Goals: Motivation; Time post onset Barriers/Prognosis Comment: Recurrent hospital admissions and severe deconditioning Patient/Family Stated Goal: none stated Consulted and agree with results and recommendations: Pt unable/family or caregiver  not available Pain: Pain Assessment Pain Assessment: Faces Pain Score: 0 Faces Pain Scale: 4 Breathing: 0 Negative Vocalization: 0 Facial Expression: 0 Body Language: 0 Consolability: 0 PAINAD Score: 0 Pain Location: R knee Pain Descriptors / Indicators: Grimacing; Guarding Pain Intervention(s): Limited activity within patient's tolerance; Monitored during session; Repositioned End of Session: Start Time:SLP Start Time (ACUTE ONLY): 0830 Stop Time: SLP Stop Time (ACUTE ONLY): 0905 Time Calculation:SLP Time Calculation (min) (ACUTE ONLY): 35 min Charges: SLP Evaluations $ SLP Speech Visit: 1 Visit SLP Evaluations $BSS Swallow: 1 Procedure $MBS Swallow: 1 Procedure $Swallowing Treatment: 1 Procedure SLP visit diagnosis: SLP Visit Diagnosis: Dysphagia, oropharyngeal phase (R13.12) Past Medical History: Past Medical History: Diagnosis Date  Benign essential HTN 10/31/2015  Cardiac tamponade due to viral pericarditis   LBBB (left bundle branch block)   Lung mass   fungal infection s/p resection at [facility]  Postoperative atrial fibrillation [facility])   Pulmonary nodule  Past Surgical History: Past Surgical History: Procedure Laterality Date  ABDOMINAL HYSTERECTOMY  1988  chest tube placement  09/03/11  LUNG REMOVAL, PARTIAL    right knee arthoscopy with meniscal repair    subxiphoid pericardial window and drainage anterior partial pericardiectomy  09/03/11 [name], MS [facility] SLP Acute Rehab Services Office [phone] [name] 10/14/2022, 9:35 AM    Scheduled Meds:  acetaminophen  650 mg Oral TID   enoxaparin (LOVENOX) injection  40 mg Subcutaneous Q24H   hydrocortisone cream   Topical QID   ketotifen  1 drop Both Eyes BID   magic mouthwash  5 mL Oral QID   metoprolol succinate  12.5 mg Oral Daily   polyethylene glycol  17 g Oral BID   Continuous Infusions:  fluconazole (DIFLUCAN) IV 200 mg (10/15/22 1351)   lactated ringers 125 mL/hr at 10/16/22 0656     LOS: 4 days   Time spent:   Azucena Fallen, DO [facility]  If 7PM-7AM, please contact night-coverage [email or web address]  10/16/2022, 7:54 AM

## 2022-10-16 NOTE — Progress Notes (Signed)
Occupational Therapy Treatment Patient Details Name: Barbara Nelson MRN: [id number] DOB: 12/14/1940 Today's Date: 10/16/2022   History of present illness Patient is a 82 year old female who presented with decreased urinary output, difficulty with mobility and swallowing. Patient was admitted with aspiration pneumonia, hyponatremia, PMH: HTN, recent UTI.   OT comments  Pt with lethargy, opening her eyes for only a few seconds with pt's bed in chair position. She did follow commands to squeeze her squeeze balls with her hands. Total assist for positioning in bed with poor head control and tendency to rest head rotated to R. Completed PROM B UEs. Pt noted to grimace with bed positioning and L shoulder FF > 90 degrees. [name] supportive daughters participating. Daughter's are deciding whether pt will d/c home or to a facility.    Recommendations for follow up therapy are one component of a multi-disciplinary discharge planning process, led by the attending physician.  Recommendations may be updated based on patient status, additional functional criteria and insurance authorization.    Assistance Recommended at Discharge Frequent or constant Supervision/Assistance  Patient can return home with the following  Two people to help with walking and/or transfers;Two people to help with bathing/dressing/bathroom;Assistance with cooking/housework;Direct supervision/assist for medications management;Assist for transportation;Help with stairs or ramp for entrance;Direct supervision/assist for financial management   Equipment Recommendations  Other (comment) (hoyer lift)    Recommendations for Other Services      Precautions / Restrictions Precautions Precautions: Fall Restrictions Weight Bearing Restrictions: No       Mobility Bed Mobility                    Transfers                         Balance                                           ADL either  performed or assessed with clinical judgement   ADL                                         General ADL Comments: total assist, family feeds pt small bits when she is alert    Extremity/Trunk Assessment Upper Extremity Assessment RUE Deficits / Details: performed PROM, pt used squeeze balls, elevated on pillow due to edema LUE Deficits / Details: same at R            Vision       Perception     Praxis      Cognition Arousal/Alertness: Lethargic Behavior During Therapy: Flat affect                                   General Comments: pt following commands 10% of time        Exercises      Shoulder Instructions       General Comments      Pertinent Vitals/ Pain       Pain Assessment Pain Assessment: Faces Faces Pain Scale: Hurts a little bit Pain Location: grimaced with positioning, with ROM L shoulder Pain Descriptors / Indicators: Grimacing, Guarding Pain Intervention(s): Repositioned, Monitored during session  Home Living                                          Prior Functioning/Environment              Frequency  Min 2X/week        Progress Toward Goals  OT Goals(current goals can now be found in the care plan section)  Progress towards OT goals: Not progressing toward goals - comment  Acute Rehab OT Goals OT Goal Formulation: With family Time For Goal Achievement: 10/26/22 Potential to Achieve Goals: Fair  Plan Discharge plan remains appropriate    Co-evaluation                 AM-PAC OT "6 Clicks" Daily Activity     Outcome Measure   Help from another person eating meals?: Total Help from another person taking care of personal grooming?: Total Help from another person toileting, which includes using toliet, bedpan, or urinal?: Total Help from another person bathing (including washing, rinsing, drying)?: Total Help from another person to put on and taking off regular  upper body clothing?: Total Help from another person to put on and taking off regular lower body clothing?: Total 6 Click Score: 6    End of Session    OT Visit Diagnosis: Pain;Muscle weakness (generalized) (M62.81);Other symptoms and signs involving the nervous system (R29.898)   Activity Tolerance Patient limited by lethargy   Patient Left in bed;with call bell/phone within reach;with bed alarm set;with family/visitor present   Nurse Communication Other (comment) (lethargy)        Time: 1610-9604 OT Time Calculation (min): 27 min  Charges: OT General Charges $OT Visit: 1 Visit OT Treatments $Therapeutic Activity: 23-37 mins  Berna Spare, OTR/L Acute Rehabilitation Services Office: [phone]   Evern Bio 10/16/2022, 2:51 PM

## 2022-10-16 NOTE — Plan of Care (Signed)
  Problem: Education: Goal: Knowledge of General Education information will improve Description: Including pain rating scale, medication(s)/side effects and non-pharmacologic comfort measures Outcome: Progressing   Problem: Activity: Goal: Risk for activity intolerance will decrease Outcome: Progressing   Problem: Nutrition: Goal: Adequate nutrition will be maintained Outcome: Progressing   

## 2022-10-17 DIAGNOSIS — Z515 Encounter for palliative care: Secondary | ICD-10-CM | POA: Diagnosis not present

## 2022-10-17 DIAGNOSIS — Z7189 Other specified counseling: Secondary | ICD-10-CM | POA: Diagnosis not present

## 2022-10-17 LAB — CBC
HCT: 32.4 % — ABNORMAL LOW (ref 36.0–46.0)
Hemoglobin: 11.2 g/dL — ABNORMAL LOW (ref 12.0–15.0)
MCH: 29.2 pg (ref 26.0–34.0)
MCHC: 34.6 g/dL (ref 30.0–36.0)
MCV: 84.4 fL (ref 80.0–100.0)
Platelets: 112 10*3/uL — ABNORMAL LOW (ref 150–400)
RBC: 3.84 MIL/uL — ABNORMAL LOW (ref 3.87–5.11)
RDW: 17.1 % — ABNORMAL HIGH (ref 11.5–15.5)
WBC: 6.5 10*3/uL (ref 4.0–10.5)
nRBC: 0 % (ref 0.0–0.2)

## 2022-10-17 LAB — BASIC METABOLIC PANEL
Anion gap: 5 (ref 5–15)
BUN: 5 mg/dL — ABNORMAL LOW (ref 8–23)
CO2: 24 mmol/L (ref 22–32)
Calcium: 8.4 mg/dL — ABNORMAL LOW (ref 8.9–10.3)
Chloride: 108 mmol/L (ref 98–111)
Creatinine, Ser: 0.57 mg/dL (ref 0.44–1.00)
GFR, Estimated: >60 mL/min (ref >60)
Glucose, Bld: 83 mg/dL (ref 70–99)
Potassium: 3.4 mmol/L — ABNORMAL LOW (ref 3.5–5.1)
Sodium: 137 mmol/L (ref 135–145)

## 2022-10-17 NOTE — Progress Notes (Signed)
PROGRESS NOTE    Barbara Nelson  ZOX:[id number] DOB: 02/22/1941 DOA: 10/11/2022 PCP: [name], MD   Brief Narrative:  82 year old woman lives with daughter, PMH recurrent UTI since February with gradual physical decline, who presented with generalized weakness, poor oral intake, confusion, difficulty swallowing with choking and coughing. Admitted for aspiration pneumonia. Possible UTI. History of multiple episodes of pneumonia and UTI since February.   Assessment & Plan:   Principal Problem:   Aspiration pneumonia (HCC) Active Problems:   Oral candidiasis   FTT (failure to thrive) in adult   Generalized weakness  Goals of Care Lengthy discussion at bedside daily with family Patient's p.o. intake continues to be somewhat poor, urine output diminishing and appears dark in color despite IV fluids Palliative care following, family currently hoping to follow for additional 48 to 72 hours to evaluate for further progression versus improvement in patient's aspiration and mental status. Patient more awake and alert today, discussion with family at bedside, and per patient's request I agree that if patient's p.o. intake would improve we could consider discharge home and have her followed outpatient by PCP and if necessary transition to palliative care at home.  Sepsis secondary to aspiration pneumonia, POA, resolved Acute hypoxic respiratory failure, POA, improving Completed empiric antibiotics Continue to wean oxygen as tolerated, ambulatory oxygen screen pending patient's ability to cooperate with PT/ongoing weakness/encephalopathy Sepsis criteria given tachypnea and leukopenia SLP following - recs: Full liquid diet/Dysphagia 1(pureed)+level 2 thickened liquids   Acute metabolic encephalopathy versus toxic encephalopathy, improving Patient has been struggling with multiple episodes of pneumonia and UTI since February.   Has had instances at home where she "sleeps half the week",  has had multiple rounds of antibiotics. At this point given patient's above diagnosis patient's multiple pneumonias are likely aspiration in origin   Odynophagia Oral candidasis Suspect pharyngeal candidiasis Dysphagia Continue Diflucan, magic mouthwash Diet per speech therapy as above (liquid diet: nectar thick; pureed(dysphagia 1). Likely the source of her ongoing aspiration pneumonia/pneumonitis   FTT Generalized weakness Encourage diet when more awake.   PT recommending SNF, discussion with family indicates discharge home is their goal if possible    Hyponatremia, hypovolemic Hypokalemia Ongoing in the setting of poor p.o. intake and dehydration IV fluids ongoing   Recurrent UTI Urinary retention? Multiple rounds of abx at home.  Completed empiric antibiotics for aspiration pneumonia as above.  No culture sent based on urinalysis.  DVT prophylaxis: Lovenox Code Status: Full Family Communication: At bedside  Status is: Inpt  Dispo: The patient is from: Home              Anticipated d/c is to: TBD -likely home              Anticipated d/c date is: 24-48h              Patient currently NOT medically stable for discharge  Consultants:  None  Procedures:  None  Antimicrobials:  Ceftriaxone; flagyl - completed Fluconazole -ongoing  Subjective: Patient much more awake alert and oriented this morning, answering questions appropriately.  She requests to be "sent home" which we discussed would be possible under the palliative care umbrella or if she would improve her p.o. intake.  Family at bedside is hopeful her p.o. intake will improve enough to discharge home in the next 24 hours.  Objective: Vitals:   10/16/22 2035 10/16/22 2040 10/16/22 2041 10/17/22 0557  BP:  134/78  (!) 145/77  Pulse: 80 88 80 86  Resp:  16  17  Temp:  98 F (36.7 C)  98.3 F (36.8 C)  TempSrc:  Oral  Oral  SpO2:  96%  97%  Weight:      Height:        Intake/Output Summary (Last 24  hours) at 10/17/2022 0715 Last data filed at 10/17/2022 0600 Gross per 24 hour  Intake 1948.02 ml  Output 300 ml  Net 1648.02 ml    Filed Weights   10/11/22 0524  Weight: 90.7 kg    Examination:  General:  Pleasantly resting in bed, No acute distress.  Poorly interactive but easily arousable, discussed patient's plan of care, she was able to recite her goals HEENT:  Normocephalic atraumatic.  Sclerae nonicteric, noninjected.  Extraocular movements intact bilaterally. Neck:  Without mass or deformity.  Trachea is midline. Lungs: Scant bilateral rhonchi without overt rales or wheezes. Heart:  Regular rate and rhythm.  Without murmurs, rubs, or gallops. Abdomen:  Soft, nontender, nondistended.  Without guarding or rebound. Extremities: Without cyanosis, clubbing, edema, or obvious deformity. Vascular:  Dorsalis pedis and posterior tibial pulses palpable bilaterally. Skin:  Warm and dry, no erythema, no ulcerations.   Data Reviewed: I have personally reviewed following labs and imaging studies  CBC: Recent Labs  Lab 10/11/22 0606 10/15/22 0341 10/17/22 0329  WBC 3.9* 7.9 6.5  NEUTROABS 2.1  --   --   HGB 12.6 11.6* 11.2*  HCT 36.3 33.6* 32.4*  MCV 84.2 83.2 84.4  PLT 124* 123* 112*    Basic Metabolic Panel: Recent Labs  Lab 10/11/22 0606 10/12/22 0843 10/13/22 0331 10/15/22 0341 10/17/22 0329  NA 128* 131* 131* 136 137  K 3.3* 3.5 3.9 3.2* 3.4*  CL 98 102 104 105 108  CO2 25 23 21* 24 24  GLUCOSE 87 82 79 75 83  BUN 8 7* 6* 6* 5*  CREATININE 0.63 0.65 0.61 0.55 0.57  CALCIUM 8.3* 8.1* 7.8* 8.4* 8.4*    GFR: Estimated Creatinine Clearance: 61.5 mL/min (by C-G formula based on SCr of 0.57 mg/dL). Liver Function Tests: Recent Labs  Lab 10/11/22 0606  AST 50*  ALT 24  ALKPHOS 116  BILITOT 0.9  PROT 5.5*  ALBUMIN 1.7*    No results for input(s): "LIPASE", "AMYLASE" in the last 168 hours. Recent Labs  Lab 10/11/22 0606  AMMONIA 15    Sepsis  Labs: Recent Labs  Lab 10/11/22 0606  LATICACIDVEN 1.2     Recent Results (from the past 240 hour(s))  Culture, blood (routine x 2)     Status: None   Collection Time: 10/11/22  9:15 AM   Specimen: Left Antecubital; Blood  Result Value Ref Range Status   Specimen Description   Final    LEFT ANTECUBITAL BLOOD Performed at [facility], [address] [place], [place] [address]    Special Requests   Final    BOTTLES DRAWN AEROBIC AND ANAEROBIC Blood Culture adequate volume Performed at [facility], [address] [place], [place] [address]    Culture   Final    NO GROWTH 5 DAYS Performed at [facility], [address], [place], [place] [address]    Report Status 10/16/2022 FINAL  Final  Culture, blood (routine x 2)     Status: None   Collection Time: 10/11/22  4:54 PM   Specimen: BLOOD LEFT HAND  Result Value Ref Range Status   Specimen Description   Final    BLOOD  LEFT HAND Performed at [facility], [address] [place], [place] [address]    Special Requests   Final    BOTTLES DRAWN AEROBIC AND ANAEROBIC Blood Culture adequate volume Performed at [facility], [address] [place], [place] [address]    Culture   Final    NO GROWTH 5 DAYS Performed at [facility], [address], [place], [place] [address]    Report Status 10/16/2022 FINAL  Final         Radiology Studies: No results found.   Scheduled Meds:  acetaminophen  650 mg Oral TID   enoxaparin (LOVENOX) injection  40 mg Subcutaneous Q24H   hydrocortisone cream   Topical QID   ketotifen  1 drop Both Eyes BID   magic mouthwash  5 mL Oral QID   metoprolol succinate  12.5 mg Oral Daily   polyethylene glycol  17 g Oral BID   potassium chloride  40 mEq Oral Once   Continuous Infusions:  fluconazole (DIFLUCAN) IV 200 mg (10/16/22 1339)   lactated ringers 75 mL/hr at 10/17/22 0435     LOS: 5 days   Time spent:    Azucena Fallen, DO [facility]  If 7PM-7AM, please contact night-coverage [email or web address]  10/17/2022, 7:15 AM

## 2022-10-17 NOTE — Progress Notes (Signed)
Mobility Specialist - Progress Note   10/17/22 1530  Mobility  Range of Motion/Exercises All extremities;Active Assistive;Passive  Activity Response Tolerated well  $Mobility charge 1 Mobility  Mobility Specialist Start Time (ACUTE ONLY) 1459  Mobility Specialist Stop Time (ACUTE ONLY) 1530  Mobility Specialist Time Calculation (min) (ACUTE ONLY) 31 min   Pt agreeable to in bed exercises. Family in room and provided pt with encouragement. Pt actively completed UE exercise, but required more A with LE exercises. At EOS, pt left in bed with call bell at side and family in room.  Exercises Completed Bilaterally:  Ankle Pumps x10 Heel Slides x 10 Hip Abduction x10 (passive) Leg Lifts x10 UE Horizontal Adduction   Arliss Journey Mobility Specialist Acute Rehabilitation Services Phone: [phone] 10/17/22, 3:35 PM

## 2022-10-17 NOTE — Progress Notes (Signed)
Daily Progress Note   Patient Name: Barbara Nelson       Date: 10/17/2022 DOB: [birth year]  Age: 82 y.o. MRN#: [id number] Attending Physician: Azucena Fallen, MD Primary Care Physician: [name], MD Admit Date: 10/11/2022  Reason for Consultation/Follow-up: Establishing goals of care  Subjective: Awake alert resting in bed answers questions appropriately family at bedside Patient states that she rested well Patient states that if she is able to tolerate her current diet-had some grits  and orange juice for breakfast so far.  Length of Stay: 5  Current Medications: Scheduled Meds:   acetaminophen  650 mg Oral TID   enoxaparin (LOVENOX) injection  40 mg Subcutaneous Q24H   hydrocortisone cream   Topical QID   ketotifen  1 drop Both Eyes BID   magic mouthwash  5 mL Oral QID   metoprolol succinate  12.5 mg Oral Daily   polyethylene glycol  17 g Oral BID   potassium chloride  40 mEq Oral Once    Continuous Infusions:  fluconazole (DIFLUCAN) IV 200 mg (10/16/22 1339)   lactated ringers 75 mL/hr at 10/17/22 0435    PRN Meds: acetaminophen, albuterol, food thickener, guaiFENesin-dextromethorphan, ondansetron (ZOFRAN) IV  Physical Exam         Awake alert Answers questions appropriately Resting in bed Several oxygen via nasal cannula Not in respiratory distress Has 1-2+ lower extremity edema  Vital Signs: BP (!) 145/77   Pulse 86   Temp 98.3 F (36.8 C) (Oral)   Resp 17   Ht 5\' 6"  (1.676 m)   Wt 90.7 kg   LMP  (LMP Unknown)   SpO2 97%   BMI 32.27 kg/m  SpO2: SpO2: 97 % O2 Device: O2 Device: Nasal Cannula O2 Flow Rate: O2 Flow Rate (L/min): 3 L/min  Intake/output summary:  Intake/Output Summary (Last 24 hours) at 10/17/2022 1105 Last data filed at  10/17/2022 0920 Gross per 24 hour  Intake 140 ml  Output 300 ml  Net -160 ml   LBM: Last BM Date : 10/16/22 Baseline Weight: Weight: 90.7 kg Most recent weight: Weight: 90.7 kg       Palliative Assessment/Data:      Patient Active Problem List   Diagnosis Date Noted   Oral candidiasis 10/12/2022   FTT (failure to thrive) in adult 10/12/2022  Generalized weakness 10/12/2022   Aspiration pneumonia (HCC) 10/11/2022   Urinary retention 07/27/2022   Nausea and vomiting 07/10/2022   Pressure injury of skin 07/10/2022   Unspecified atrial fibrillation (HCC) 07/01/2022   Acute metabolic encephalopathy 06/30/2022   Hypercalcemia 06/30/2022   Dehydration 06/30/2022   Acute prerenal azotemia 06/30/2022   Transaminitis 06/30/2022   Constipation 06/30/2022   DM2 (diabetes mellitus, type 2) (HCC) 06/30/2022   Allergic rhinitis 06/30/2022   Chronic diastolic CHF (congestive heart failure) (HCC) 06/30/2022   Shortness of breath 06/20/2022   Acute hypoxic respiratory failure (HCC) 06/18/2022   Hyponatremia 06/18/2022   Body mass index (BMI) 39.0-39.9, adult 01/05/2022   Decreased estrogen level 01/05/2022   Prediabetes 01/05/2022   Dry eyes 01/05/2022   History of pericarditis 01/05/2022   Essential hypertension 01/05/2022   LBBB (left bundle branch block) 09/17/2017   Benign essential HTN 10/31/2015   Heart palpitations 10/31/2015   Lung mass 06/27/2013   Edema of extremities 06/27/2013   Pericarditis, viral    History of pericardiectomy 06/19/2013   Chronic pulmonary blastomycosis (HCC) 08/23/2012   Pulmonary nodule, right 03/09/2012   Pericardial effusion/ pericarditis 10/06/2011    Palliative Care Assessment & Plan   Patient Profile:    Assessment: 82 year old with aspiration pneumonia constipation generalized deconditioning degenerative joint disease. Recurrent urinary tract infections Gradual physical decline Generalized weakness Poor oral  intake   Recommendations/Plan: Full code, family remains hopeful for ongoing stabilization/recovery and is assisting with the patient's diet    Code Status:    Code Status Orders  (From admission, onward)           Start     Ordered   10/11/22 1132  Full code  Continuous       Question:  By:  Answer:  Consent: discussion documented in EHR   10/11/22 1133           Code Status History     Date Active Date Inactive Code Status Order ID Comments User Context   06/30/2022 2019 07/16/2022 1939 Full Code [id number]  Angie Fava, DO ED   06/18/2022 2010 06/27/2022 0018 Full Code [id number]  Hillary Bow, DO Inpatient       Prognosis:  Unable to determine  Discharge Planning: To Be Determined  Care plan was discussed with  patient and family.   Thank you for allowing the Palliative Medicine Team to assist in the care of this patient.  Mod MDM     Greater than 50%  of this time was spent counseling and coordinating care related to the above assessment and plan.  Rosalin Hawking, MD  Please contact Palliative Medicine Team phone at [phone] for questions and concerns.

## 2022-10-18 DIAGNOSIS — Z515 Encounter for palliative care: Secondary | ICD-10-CM | POA: Diagnosis not present

## 2022-10-18 DIAGNOSIS — Z7189 Other specified counseling: Secondary | ICD-10-CM | POA: Diagnosis not present

## 2022-10-18 NOTE — Progress Notes (Signed)
Daily Progress Note   Patient Name: Barbara Nelson       Date: 10/18/2022 DOB: Aug 11, 1940  Age: 82 y.o. MRN#: [id number] Attending Physician: Azucena Fallen, MD Primary Care Physician: [name], MD Admit Date: 10/11/2022  Reason for Consultation/Follow-up: Establishing goals of care  Subjective:   resting in bed  Family at bedside, goals of care discussions held, see below.     Length of Stay: 6  Current Medications: Scheduled Meds:   acetaminophen  650 mg Oral TID   enoxaparin (LOVENOX) injection  40 mg Subcutaneous Q24H   hydrocortisone cream   Topical QID   ketotifen  1 drop Both Eyes BID   magic mouthwash  5 mL Oral QID   metoprolol succinate  12.5 mg Oral Daily   polyethylene glycol  17 g Oral BID   potassium chloride  40 mEq Oral Once    Continuous Infusions:  fluconazole (DIFLUCAN) IV 200 mg (10/17/22 1306)   lactated ringers 75 mL/hr at 10/17/22 0435    PRN Meds: acetaminophen, albuterol, food thickener, guaiFENesin-dextromethorphan, ondansetron (ZOFRAN) IV  Physical Exam         Awakens easily Resting in bed   oxygen via nasal cannula Not in respiratory distress Has 1-2+ lower extremity edema  Vital Signs: BP (!) 156/83 (BP Location: Left Arm)   Pulse 85   Temp (!) 97.5 F (36.4 C) (Oral)   Resp 18   Ht 5\' 6"  (1.676 m)   Wt 90.7 kg   LMP  (LMP Unknown)   SpO2 91%   BMI 32.27 kg/m  SpO2: SpO2: 91 % O2 Device: O2 Device: Nasal Cannula O2 Flow Rate: O2 Flow Rate (L/min): 2 L/min  Intake/output summary:  Intake/Output Summary (Last 24 hours) at 10/18/2022 1224 Last data filed at 10/18/2022 0600 Gross per 24 hour  Intake 3736.57 ml  Output --  Net 3736.57 ml    LBM: Last BM Date : 10/18/22 Baseline Weight: Weight: 90.7 kg Most recent  weight: Weight: 90.7 kg       Palliative Assessment/Data:      Patient Active Problem List   Diagnosis Date Noted   Oral candidiasis 10/12/2022   FTT (failure to thrive) in adult 10/12/2022   Generalized weakness 10/12/2022   Aspiration pneumonia (HCC) 10/11/2022   Urinary retention 07/27/2022  Nausea and vomiting 07/10/2022   Pressure injury of skin 07/10/2022   Unspecified atrial fibrillation (HCC) 07/01/2022   Acute metabolic encephalopathy 06/30/2022   Hypercalcemia 06/30/2022   Dehydration 06/30/2022   Acute prerenal azotemia 06/30/2022   Transaminitis 06/30/2022   Constipation 06/30/2022   DM2 (diabetes mellitus, type 2) (HCC) 06/30/2022   Allergic rhinitis 06/30/2022   Chronic diastolic CHF (congestive heart failure) (HCC) 06/30/2022   Shortness of breath 06/20/2022   Acute hypoxic respiratory failure (HCC) 06/18/2022   Hyponatremia 06/18/2022   Body mass index (BMI) 39.0-39.9, adult 01/05/2022   Decreased estrogen level 01/05/2022   Prediabetes 01/05/2022   Dry eyes 01/05/2022   History of pericarditis 01/05/2022   Essential hypertension 01/05/2022   LBBB (left bundle branch block) 09/17/2017   Benign essential HTN 10/31/2015   Heart palpitations 10/31/2015   Lung mass 06/27/2013   Edema of extremities 06/27/2013   Pericarditis, viral    History of pericardiectomy 06/19/2013   Chronic pulmonary blastomycosis (HCC) 08/23/2012   Pulmonary nodule, right 03/09/2012   Pericardial effusion/ pericarditis 10/06/2011    Palliative Care Assessment & Plan   Patient Profile:    Assessment: 82 year old with aspiration pneumonia constipation generalized deconditioning degenerative joint disease. Recurrent urinary tract infections Gradual physical decline Generalized weakness Poor oral intake   Recommendations/Plan: Full code Goals of care discussions held, disposition options explored, discussed about SNF rehab with palliative versus home with palliative  versus home with hospice. All of their questions addressed to the best of my ability.  TOC to follow up.  Continue current mode of care     Code Status:    Code Status Orders  (From admission, onward)           Start     Ordered   10/11/22 1132  Full code  Continuous       Question:  By:  Answer:  Consent: discussion documented in EHR   10/11/22 1133           Code Status History     Date Active Date Inactive Code Status Order ID Comments User Context   06/30/2022 2019 07/16/2022 1939 Full Code [id number]  Angie Fava, DO ED   06/18/2022 2010 06/27/2022 0018 Full Code [id number]  Hillary Bow, DO Inpatient       Prognosis:  Unable to determine  Discharge Planning: To Be Determined  Care plan was discussed with  patient and family, TRH MD.   Thank you for allowing the Palliative Medicine Team to assist in the care of this patient.  Mod MDM     Greater than 50%  of this time was spent counseling and coordinating care related to the above assessment and plan.  Rosalin Hawking, MD  Please contact Palliative Medicine Team phone at [phone] for questions and concerns.

## 2022-10-18 NOTE — ACP (Advance Care Planning) (Signed)
  PROGRESS NOTE    Barbara Nelson  ZOX:[id number] DOB: [birth year] DOA: 10/11/2022 PCP: [name], MD   Brief Narrative:    Lengthy discussion with family at bedside and over the phone today, patient more awake alert and oriented.  Patient decided she did not want to be intubated if her respiratory status declined but could not decide on whether or not she would continue to want CPR at this time, as such we will transition patient to limited CODE STATUS with DO NOT INTUBATE per discussion.  Now the patient is more awake she is requesting discharge home in the next 24 hours, however family is likely unable to care for patient in her current state at home as such we will likely plan for disposition to nursing facility with palliative care to follow.  Family understands patient's condition is somewhat guarded given her poor p.o. intake from both a calorie standpoint as well as a fluid standpoint.  Assessment & Plan:   Principal Problem:   Aspiration pneumonia (HCC) Active Problems:   Oral candidiasis   FTT (failure to thrive) in adult   Generalized weakness   Azucena Fallen, DO [facility]  If 7PM-7AM, please contact night-coverage [email or web address]  10/18/2022, 10:30 AM

## 2022-10-18 NOTE — Progress Notes (Addendum)
Patient on 4L, breathing with use of accessory muscles; patient 100% on 4L, resting in bed; awakens when you speak to her; Dr. Natale Milch notified of respiratory changes; will continue to monitor  05:58PM Per MD order, keep SATs above 90 and continue to monitor

## 2022-10-18 NOTE — TOC Progression Note (Signed)
Transition of Care [facility]) - Progression Note    Patient Details  Name: Barbara Nelson MRN: [id number] Date of Birth: [birth year]  [facility] CM/SW Contact  Georgie Chard, LCSW Phone Number: 10/18/2022, 11:41 AM  Clinical Narrative:    CSW has spoken to the patient's daughter [name] at this time the daughter's have expressed there concern that the DC plan to them is not clearly laid out. The family is willing to take the patient home with palliative but would like to give a better understanding of all that entails. The family is  currently paying for private home care. The family stated that there is not issue with the mom coming home however they want to make sure that all the resources are put in place for a safe DC. This CSW express understanding to the family and explained that palliative will follow up with the family tomorrow as well TOC. TOC will help arrange a following up for the family to discuss and get an idea of what palliative will offer to the patient. The family also stated that if they feel like it would be better for the mom to go to a SNF that will also be an option. The family also mentioned not having a lift at home and will need help arranging to get one if patient is DC home. At this time [facility] will follow up with the family and address concern for the patient's DC.    Expected Discharge Plan: Home w Home Health Services Barriers to Discharge: Continued Medical Work up  Expected Discharge Plan and Services In-house Referral: Clinical Social Work   Post Acute Care Choice: Home Health Living arrangements for the past 2 months: Single Family Home                 DME Arranged: N/A DME Agency: NA       HH Arranged: PT, OT HH Agency: [employer]         Social Determinants of Health (SDOH) Interventions SDOH Screenings   Food Insecurity: No Food Insecurity (10/11/2022)  Housing: Low Risk  (10/11/2022)  Transportation Needs: No  Transportation Needs (10/11/2022)  Utilities: Not At Risk (10/11/2022)  Tobacco Use: Medium Risk (10/11/2022)    Readmission Risk Interventions     No data to display

## 2022-10-18 NOTE — Progress Notes (Signed)
PROGRESS NOTE    Barbara Nelson  ION:[id number] DOB: 18-Jul-1940 DOA: 10/11/2022 PCP: [name], MD   Brief Narrative:  82 year old woman lives with daughter, PMH recurrent UTI since February with gradual physical decline, who presented with generalized weakness, poor oral intake, confusion, difficulty swallowing with choking and coughing. Admitted for aspiration pneumonia. Possible UTI. History of multiple episodes of pneumonia and UTI since February.   Patient admitted as above with recurrent episode of pneumonia with moderate hypoxia and respiratory distress as well as concurrent questionable UTI.  Patient completed antibiotic course but continues to have respiratory symptoms and hypoxia.  Speech evaluation improved that patient does have moderately profound dysphagia requiring nectar thick liquids which has limited her p.o. intake in both the caloric and free water aspects.  Given patient's poor nutritional status, ongoing aspiration, and concern for failure to thrive palliative care was consulted.  Lengthy discussion with family over the weekend about patient's prognosis, we continue to push and encourage patient to participate with PT and increase p.o. intake as appropriate - she remains quite somnolent however likely in the setting of malnourishment.  Given patient's prognosis today on 10/18/2022 family agreed to palliative versus hospice care at home.  Will continue full scope of care here.  Patient did make herself DNI today as she would not want to be intubated but she was unable to confirm or deny whether or not she wanted CPR as she was requesting "asked me again later and let me think about it".  At this point patient is medically stable for discharge, however we continue to await further evaluation with family and palliative care in regards to disposition location and needs.  Unclear if family is able to manage the patient at home or if hospice house would be more  appropriate.  Assessment & Plan:   Principal Problem:   Aspiration pneumonia (HCC) Active Problems:   Oral candidiasis   FTT (failure to thrive) in adult   Generalized weakness   Goals of Care Lengthy discussion at bedside daily with family Patient's p.o. intake continues to be somewhat poor, urine output diminishing and appears dark in color despite IV fluids @75  continuously Palliative care following, family understands patient's clinical status will likely be dictated by her nutritional status which is quite poor. Patient has made herself DNI as of 10/18/2022  Potential discharge pending safe location and whether or not family will be able to manage her and her symptoms at home.  Sepsis secondary to aspiration pneumonia, POA, resolved Acute hypoxic respiratory failure, POA, improving Completed empiric antibiotics Continue to wean oxygen as tolerated, ambulatory oxygen screen pending patient's ability to cooperate with PT/ongoing weakness/encephalopathy Sepsis criteria given tachypnea and leukopenia SLP following - recs: Full liquid diet/Dysphagia 1(pureed)+level 2 thickened liquids   Acute metabolic encephalopathy versus toxic encephalopathy, improving Patient has been struggling with multiple episodes of pneumonia and UTI since February.   Has had instances at home where she "sleeps half the week", has had multiple rounds of antibiotics with minimal to no improvement. At this point given patient's above diagnosis patient's multiple pneumonias are likely aspiration in origin   Odynophagia, resolved Oral candidasis, improving Suspect pharyngeal candidiasis Continue Diflucan, magic mouthwash Diet per speech therapy as above (liquid diet: nectar thick; pureed(dysphagia 1). Previously complicating her aspiration pneumonia/pneumonitis but patient continues to have issues despite resolution of pain and improvement in candidiasis   FTT Generalized weakness Encourage diet when awake  and able to tolerate p.o. safely PT recommending SNF, discharge likely  home with hospice versus palliative care as above   Hyponatremia, hypovolemic Hypokalemia Ongoing in the setting of poor p.o. intake and dehydration IV fluids ongoing   Recurrent UTI Urinary retention? Multiple rounds of abx at home.  Completed empiric antibiotics for aspiration pneumonia as above.  No culture sent based on urinalysis. Urinary retention resolved overnight - multiple episodes without need for intervention  DVT prophylaxis: Lovenox Code Status: Full Family Communication: At bedside  Status is: Inpt  Dispo: The patient is from: Home              Anticipated d/c is to: TBD -likely home w/ hospice/palliative              Anticipated d/c date is: 24-48h              Patient currently NOT medically stable for discharge  Consultants:  Palliative care  Procedures:  None  Antimicrobials:  Ceftriaxone; flagyl - completed Fluconazole -ongoing  Subjective: Patient much more awake alert and oriented this morning, answering questions appropriately - requests not to be placed on ventilator if her breathing gets worse. Otherwise in good spirits but remains somnolent- however she is easily aroused..  Objective: Vitals:   10/17/22 0557 10/17/22 1345 10/17/22 2152 10/18/22 0633  BP: (!) 145/77 (!) 152/90 (!) 163/103 (!) 156/83  Pulse: 86 76 83 85  Resp: 17 16 18 18   Temp: 98.3 F (36.8 C) (!) 97.5 F (36.4 C) (!) 97.4 F (36.3 C) (!) 97.5 F (36.4 C)  TempSrc: Oral  Axillary Oral  SpO2: 97% 100% 100% 91%  Weight:      Height:        Intake/Output Summary (Last 24 hours) at 10/18/2022 0717 Last data filed at 10/18/2022 0600 Gross per 24 hour  Intake 3786.57 ml  Output --  Net 3786.57 ml    Filed Weights   10/11/22 0524  Weight: 90.7 kg    Examination:  General:  Pleasantly resting in bed, No acute distress.  Poorly interactive but easily arousable, discussed patient's plan of care, she  was able to recite her goals and we discussed code status as above(Family present for discussion). HEENT:  Normocephalic atraumatic.  Sclerae nonicteric, noninjected.  Extraocular movements intact bilaterally. Neck:  Without mass or deformity.  Trachea is midline. Lungs: Scant bilateral rhonchi without overt rales or wheezes. Heart:  Regular rate and rhythm.  Without murmurs, rubs, or gallops. Abdomen:  Soft, nontender, nondistended.  Without guarding or rebound. Extremities: Without cyanosis, clubbing, edema, or obvious deformity. Vascular:  Dorsalis pedis and posterior tibial pulses palpable bilaterally. Skin:  Warm and dry, no erythema, no ulcerations.   Data Reviewed: I have personally reviewed following labs and imaging studies  CBC: Recent Labs  Lab 10/15/22 0341 10/17/22 0329  WBC 7.9 6.5  HGB 11.6* 11.2*  HCT 33.6* 32.4*  MCV 83.2 84.4  PLT 123* 112*    Basic Metabolic Panel: Recent Labs  Lab 10/12/22 0843 10/13/22 0331 10/15/22 0341 10/17/22 0329  NA 131* 131* 136 137  K 3.5 3.9 3.2* 3.4*  CL 102 104 105 108  CO2 23 21* 24 24  GLUCOSE 82 79 75 83  BUN 7* 6* 6* 5*  CREATININE 0.65 0.61 0.55 0.57  CALCIUM 8.1* 7.8* 8.4* 8.4*    GFR: Estimated Creatinine Clearance: 61.5 mL/min (by C-G formula based on SCr of 0.57 mg/dL). Liver Function Tests: No results for input(s): "AST", "ALT", "ALKPHOS", "BILITOT", "PROT", "ALBUMIN" in the last 168 hours.  No  results for input(s): "LIPASE", "AMYLASE" in the last 168 hours. No results for input(s): "AMMONIA" in the last 168 hours.  Sepsis Labs: No results for input(s): "PROCALCITON", "LATICACIDVEN" in the last 168 hours.   Recent Results (from the past 240 hour(s))  Culture, blood (routine x 2)     Status: None   Collection Time: 10/11/22  9:15 AM   Specimen: Left Antecubital; Blood  Result Value Ref Range Status   Specimen Description   Final    LEFT ANTECUBITAL BLOOD Performed at [facility], [address] [place], [place] [address]    Special Requests   Final    BOTTLES DRAWN AEROBIC AND ANAEROBIC Blood Culture adequate volume Performed at [facility], [address] [place], [place] [address]    Culture   Final    NO GROWTH 5 DAYS Performed at [facility], [address] [place], [place] [address]    Report Status 10/16/2022 FINAL  Final  Culture, blood (routine x 2)     Status: None   Collection Time: 10/11/22  4:54 PM   Specimen: BLOOD LEFT HAND  Result Value Ref Range Status   Specimen Description   Final    BLOOD LEFT HAND Performed at [facility], [address] [place], [place] [address]    Special Requests   Final    BOTTLES DRAWN AEROBIC AND ANAEROBIC Blood Culture adequate volume Performed at [facility], [address], [place], [place] [address]    Culture   Final    NO GROWTH 5 DAYS Performed at [facility], [address] [place], [place] [address]    Report Status 10/16/2022 FINAL  Final         Radiology Studies: No results found.   Scheduled Meds:  acetaminophen  650 mg Oral TID   enoxaparin (LOVENOX) injection  40 mg Subcutaneous Q24H   hydrocortisone cream   Topical QID   ketotifen  1 drop Both Eyes BID   magic mouthwash  5 mL Oral QID   metoprolol succinate  12.5 mg Oral Daily   polyethylene glycol  17 g Oral BID   potassium chloride  40 mEq Oral Once   Continuous Infusions:  fluconazole (DIFLUCAN) IV 200 mg (10/17/22 1306)   lactated ringers 75 mL/hr at 10/17/22 0435     LOS: 6 days   Time spent:   Azucena Fallen, DO [facility]  If 7PM-7AM, please contact night-coverage [email or web address]  10/18/2022, 7:17 AM

## 2022-10-18 NOTE — Progress Notes (Signed)
Mobility Specialist - Progress Note   10/18/22 1509  Mobility  Level of Assistance Maximum assist, patient does 25-49%  Assistive Device None  Range of Motion/Exercises Passive;All extremities  Activity Response Tolerated fair  $Mobility charge 1 Mobility  Mobility Specialist Start Time (ACUTE ONLY) 1433  Mobility Specialist Stop Time (ACUTE ONLY) 1459  Mobility Specialist Time Calculation (min) (ACUTE ONLY) 26 min   With encouragement from family, pt completed the in bed exercises listed below.  Heel Slides x10 each side Leg Lifts x10 each side Hip Abduction x10 each side Ankle Pumps x20 UE horizontal adduction x3 each side  Arliss Journey Mobility Specialist Acute Rehabilitation Services Phone: [phone] 10/18/22, 3:16 PM

## 2022-10-19 DIAGNOSIS — Z515 Encounter for palliative care: Secondary | ICD-10-CM | POA: Diagnosis not present

## 2022-10-19 DIAGNOSIS — E440 Moderate protein-calorie malnutrition: Secondary | ICD-10-CM | POA: Insufficient documentation

## 2022-10-19 DIAGNOSIS — Z7189 Other specified counseling: Secondary | ICD-10-CM | POA: Diagnosis not present

## 2022-10-19 DIAGNOSIS — J690 Pneumonitis due to inhalation of food and vomit: Secondary | ICD-10-CM | POA: Diagnosis not present

## 2022-10-19 LAB — CBC
HCT: 32.6 % — ABNORMAL LOW (ref 36.0–46.0)
Hemoglobin: 11.1 g/dL — ABNORMAL LOW (ref 12.0–15.0)
MCH: 28.8 pg (ref 26.0–34.0)
MCHC: 34 g/dL (ref 30.0–36.0)
MCV: 84.7 fL (ref 80.0–100.0)
Platelets: 130 10*3/uL — ABNORMAL LOW (ref 150–400)
RBC: 3.85 MIL/uL — ABNORMAL LOW (ref 3.87–5.11)
RDW: 17.6 % — ABNORMAL HIGH (ref 11.5–15.5)
WBC: 6.6 10*3/uL (ref 4.0–10.5)
nRBC: 0 % (ref 0.0–0.2)

## 2022-10-19 LAB — BASIC METABOLIC PANEL
Anion gap: 3 — ABNORMAL LOW (ref 5–15)
BUN: 5 mg/dL — ABNORMAL LOW (ref 8–23)
CO2: 26 mmol/L (ref 22–32)
Calcium: 8.2 mg/dL — ABNORMAL LOW (ref 8.9–10.3)
Chloride: 107 mmol/L (ref 98–111)
Creatinine, Ser: 0.56 mg/dL (ref 0.44–1.00)
GFR, Estimated: >60 mL/min (ref >60)
Glucose, Bld: 82 mg/dL (ref 70–99)
Potassium: 3.3 mmol/L — ABNORMAL LOW (ref 3.5–5.1)
Sodium: 136 mmol/L (ref 135–145)

## 2022-10-19 MED ORDER — PROSOURCE PLUS PO LIQD
30.0000 mL | Freq: Three times a day (TID) | ORAL | Status: DC
  Administered 2022-10-19 – 2022-10-29 (×17): 30 mL via ORAL
  Filled 2022-10-19 (×19): qty 30

## 2022-10-19 MED ORDER — POTASSIUM CHLORIDE 10 MEQ/100ML IV SOLN
10.0000 meq | INTRAVENOUS | Status: AC
  Filled 2022-10-19 (×4): qty 100

## 2022-10-19 MED ORDER — POTASSIUM CHLORIDE 20 MEQ PO PACK
20.0000 meq | PACK | Freq: Two times a day (BID) | ORAL | Status: DC
  Filled 2022-10-19: qty 1

## 2022-10-19 MED ORDER — FUROSEMIDE 20 MG PO TABS
20.0000 mg | ORAL_TABLET | Freq: Every day | ORAL | Status: DC
  Filled 2022-10-19: qty 1

## 2022-10-19 MED ORDER — FUROSEMIDE 10 MG/ML IJ SOLN
20.0000 mg | Freq: Once | INTRAMUSCULAR | Status: AC
  Administered 2022-10-19: 20 mg via INTRAVENOUS
  Filled 2022-10-19: qty 2

## 2022-10-19 MED ORDER — ADULT MULTIVITAMIN W/MINERALS CH
1.0000 | ORAL_TABLET | Freq: Every day | ORAL | Status: DC
  Administered 2022-10-20 – 2022-10-30 (×9): 1 via ORAL
  Filled 2022-10-19 (×11): qty 1

## 2022-10-19 NOTE — Progress Notes (Signed)
Initial Nutrition Assessment  DOCUMENTATION CODES:   Non-severe (moderate) malnutrition in context of chronic illness  INTERVENTION:  - Calorie count to start today (6/3) at breakfast and end tomorrow (6/4) after dinner.   - Discussed with RN.   - Please document % intake of all foods, drinks, supplements patient consumes on meal tickets and place in envelope on door.   - DYS 1 diet with Nectar thickened liquids per SLP recs.  - Assist with feeding patient order to promote intake.  - Automatic house trays to ensure patient receives 3 meals a day.   - ProSource Plus TID, each supplement provides 100 kcal and 15 grams of protein - Magic cup BID with meals, each supplement provides 290 kcal and 9 grams of protein  - Multivitamin with minerals daily to support micronutrient needs.   - Monitor weight trends.    NUTRITION DIAGNOSIS:   Moderate Malnutrition related to chronic illness as evidenced by energy intake < 75% for > or equal to 1 month, percent weight loss (9.5% in 5 months).  GOAL:   Patient will meet greater than or equal to 90% of their needs  MONITOR:   PO intake, Supplement acceptance, Diet advancement, Weight trends  REASON FOR ASSESSMENT:   Consult Calorie Count  ASSESSMENT:   82 y.o. female with PMH recurrent UTI since February with gradual physical decline, who presented with generalized weakness, poor oral intake, confusion, difficulty swallowing with choking and coughing. Admitted for aspiration pneumonia.   Patient sleeping at time of visit, sisters and daughters at bedside provided all nutrition history.   UBW reported to be around 213# and they endorse patient has been gradually losing weight since January due to eating less. Per EMR, patient has lost 9.5% body weight over the past 5 months, which is significant for the time frame.   Daughter reports patient was eating and swallowing fine until about 1 week prior to admission. Usually breakfast is her  biggest meal, then a small lunch, and occasionally a meal at dinner. Daughter notes she has been eating smaller portions recently. Drinking Premier Protein at home but only about half daily. Likes coffee flavored as she doesn't like very sweet supplements.   Patient has been seen multiple times by SLP this admission. Currently on a DYS 1 diet with nectar thickened liquids. Family report it is going okay, she does better when getting fed. Documented to be consuming 0-25% of meals. Has been enjoying [employer] which she has been getting since being on DYS 1 diet, they feel she would like vanilla better. Discussed ProSource Plus as another option and family agreeable.   Calorie count to start today and end after dinner tomorrow. Discussed with RN. Patient did not have a breakfast today due to being too drowsy.  Of note, palliative care following patient. She remains full code at this time. Hospitalist note from yesterday indicates patient medically stable for discharge but requiring further eval for disposition and needs.   Medications reviewed and include: Miralax  Labs reviewed:  K+ 3.3 HA1C 6.9   NUTRITION - FOCUSED PHYSICAL EXAM:  Flowsheet Row Most Recent Value  Orbital Region No depletion  Upper Arm Region No depletion  Thoracic and Lumbar Region No depletion  Buccal Region No depletion  Temple Region No depletion  Clavicle Bone Region No depletion  Clavicle and Acromion Bone Region No depletion  Scapular Bone Region Unable to assess  Dorsal Hand No depletion  Patellar Region No depletion  Anterior Thigh Region  No depletion  Posterior Calf Region No depletion  Edema (RD Assessment) None  Hair Reviewed  Eyes Reviewed  Mouth Reviewed  Skin Reviewed  Nails Reviewed       Diet Order:   Diet Order             DIET - DYS 1 Room service appropriate? No; Fluid consistency: Nectar Thick  Diet effective now                   EDUCATION NEEDS:  Not appropriate for  education at this time  Skin:  Skin Assessment: Skin Integrity Issues: Skin Integrity Issues:: Other (Comment) Other: Irritant Dermatitis (Moisture Associated Skin Damage) - Right buttocks  Last BM:  6/3  Height:  Ht Readings from Last 1 Encounters:  10/11/22 5\' 6"  (1.676 m)   Weight:  Wt Readings from Last 1 Encounters:  10/11/22 90.7 kg    BMI:  Body mass index is 32.27 kg/m.  Estimated Nutritional Needs:  Kcal:  1600-1800 kcals Protein:  75-90 grams Fluid:  >/= 1.5L    Shelle Iron RD, LDN For contact information, refer to [facility].

## 2022-10-19 NOTE — Progress Notes (Signed)
Physical Therapy Treatment Patient Details Name: Barbara Nelson MRN: [id number] DOB: 02-10-1941 Today's Date: 10/19/2022   History of Present Illness Patient is a 82 year old female who presented with decreased urinary output, difficulty with mobility and swallowing. Patient was admitted with aspiration pneumonia, hyponatremia, PMH: HTN, recent UTI.    PT Comments    Pt attempted x 4 today. Final attempt, family motivated to mobilize pt to the EOB which was completed with MaxA x 2. Pt tolerated ~10 minutes sitting upright fluctuating between CG-MinA for sitting balance once feet positioned supportively on floor. Pt however would not open eyes upon request or verbally respond to simple questions/commands. Pt remained on 2L O2 with SpO2 at 93% throughout session. MaxA x 2 to return to bed in upright sitting. Once comfortable, therapist began exiting room when pt opened her eyes wide. Possible disengagement during session due to poor motivation/willingness to participate. Will continue to progress as tolerated.    Recommendations for follow up therapy are one component of a multi-disciplinary discharge planning process, led by the attending physician.  Recommendations may be updated based on patient status, additional functional criteria and insurance authorization.  Follow Up Recommendations  Can patient physically be transported by private vehicle: No    Assistance Recommended at Discharge Frequent or constant Supervision/Assistance  Patient can return home with the following Two people to help with walking and/or transfers;Two people to help with bathing/dressing/bathroom;Direct supervision/assist for medications management;Help with stairs or ramp for entrance;Assistance with cooking/housework;Assist for transportation;Assistance with feeding;Direct supervision/assist for financial management   Equipment Recommendations  None recommended by PT    Recommendations for Other Services        Precautions / Restrictions Precautions Precautions: Fall Restrictions Weight Bearing Restrictions: No     Mobility  Bed Mobility Overal bed mobility: Needs Assistance Bed Mobility: Supine to Sit, Sit to Supine Rolling: Max assist   Supine to sit: HOB elevated, +2 for physical assistance, +2 for safety/equipment, Total assist Sit to supine: Total assist, +2 for physical assistance, +2 for safety/equipment   General bed mobility comments: pt not assisting despite multimodal cues provided and time; pt was not resistant or verbalizing stopping    Transfers                   General transfer comment: deferred d/t decr level of alertness, weakness, decr ability to follow commands; d/t these reasons unsafe to transfer to chair at this time    Ambulation/Gait                   Stairs             Wheelchair Mobility    Modified Rankin (Stroke Patients Only)       Balance Overall balance assessment: Needs assistance Sitting-balance support: Feet supported, Bilateral upper extremity supported Sitting balance-Leahy Scale: Poor Sitting balance - Comments: Pt sat EOB ~10 minutes fluctuating between CG-MinA                                    Cognition Arousal/Alertness: Lethargic Behavior During Therapy: Flat affect Overall Cognitive Status: Difficult to assess Area of Impairment: Attention, Following commands (eyes closed during treatment)                               General Comments:  (Pt did not follow simple commands/cues)  Exercises      General Comments General comments (skin integrity, edema, etc.):  (Pt 93% sitting EOB on 2L O2, kept eyes closed and did not verbally respond to questions.)      Pertinent Vitals/Pain Pain Assessment Pain Assessment: No/denies pain    Home Living                          Prior Function            PT Goals (current goals can now be found in the care plan  section)      Frequency    Min 1X/week      PT Plan Current plan remains appropriate    Co-evaluation              AM-PAC PT "6 Clicks" Mobility   Outcome Measure  Help needed turning from your back to your side while in a flat bed without using bedrails?: Total Help needed moving from lying on your back to sitting on the side of a flat bed without using bedrails?: Total Help needed moving to and from a bed to a chair (including a wheelchair)?: Total Help needed standing up from a chair using your arms (e.g., wheelchair or bedside chair)?: Total Help needed to walk in hospital room?: Total Help needed climbing 3-5 steps with a railing? : Total 6 Click Score: 6    End of Session Equipment Utilized During Treatment: Oxygen (2L O2 via [place]) Activity Tolerance: Patient limited by lethargy Patient left: in bed;with call bell/phone within reach;with bed alarm set;with family/visitor present Nurse Communication: Mobility status PT Visit Diagnosis: Other abnormalities of gait and mobility (R26.89);Muscle weakness (generalized) (M62.81)     Time: 1610-9604 PT Time Calculation (min) (ACUTE ONLY): 19 min  Charges:  $Therapeutic Activity: 8-22 mins                    Zadie Cleverly, PTA  Jannet Askew 10/19/2022, 4:31 PM

## 2022-10-19 NOTE — Progress Notes (Signed)
[facility] PROGRESS NOTE  SAKAE EULL (DOB: 1940/06/01) ZOX:[id number] PCP: [name], MD  Brief Narrative: Barbara Nelson is an 82 year old woman lives with daughter, PMH recurrent UTI since February with gradual physical decline, who presented with generalized weakness, poor oral intake, confusion, difficulty swallowing with choking and coughing. Admitted for aspiration pneumonia. Possible UTI. History of multiple episodes of pneumonia and UTI since February.    Patient admitted as above with recurrent episode of pneumonia with moderate hypoxia and respiratory distress as well as concurrent questionable UTI.  Patient completed antibiotic course but continues to have respiratory symptoms and hypoxia.  Speech evaluation improved that patient does have moderately profound dysphagia requiring nectar thick liquids which has limited her p.o. intake in both the caloric and free water aspects.  Given patient's poor nutritional status, ongoing aspiration, and concern for failure to thrive palliative care was consulted.    We have treated her with antibiotics, given supplemental oxygen, and she is overall stabilizing. Once stable, we plan discharge to SNF with palliative care services following.   Subjective: Pt drowsier than yesterday, still having a cough, went up to 4L O2 last night. She wakes up to talk to me, denies pain currently, no shortness of breath. Family at bedside tell of an episode of increased WOB yesterday that improved.   Objective: BP 128/83 (BP Location: Right Arm)   Pulse 82   Temp 98.4 F (36.9 C) (Oral)   Resp 20   Ht 5\' 6"  (1.676 m)   Wt 90.7 kg   LMP  (LMP Unknown)   SpO2 96%   BMI 32.27 kg/m   Gen: Elderly, frail female in no acute distress Pulm: Diminished but without crackles or wheezing, nonlabored on 4L O2  CV: RRR, no MRG, distant. 1+ dependent edema GI: Soft, NT, ND, +BS Neuro: No new focal deficits. Ext: Warm, no deformities. Skin: No  rashes, lesions or ulcers on visualized skin   Assessment & Plan: Principal Problem:   Aspiration pneumonia (HCC) Active Problems:   Oral candidiasis   FTT (failure to thrive) in adult   Generalized weakness   Malnutrition of moderate degree  Goals of Care Lengthy discussion at bedside with family for expectation setting. Palliative care following, family understands patient's clinical status will likely be dictated by her nutritional status which is quite poor. Patient has made herself DNI as of 10/18/2022    Sepsis secondary to aspiration pneumonia, POA, resolved Acute hypoxic respiratory failure, POA, improving Completed empiric antibiotics Continue to wean oxygen as tolerated, anticipate some level of requirement going forward given our suspicion for aspiration and possible ongoing silent aspiration events.  Sepsis criteria given tachypnea and leukopenia SLP following - recs: Full liquid diet/Dysphagia 1(pureed)+level 2 thickened liquids   Acute metabolic encephalopathy versus toxic encephalopathy, improving Patient has been struggling with multiple episodes of pneumonia and UTI since February.   Has had instances at home where she "sleeps half the week", has had multiple rounds of antibiotics with minimal to no improvement. At this point given patient's above diagnosis patient's multiple pneumonias are likely aspiration in origin   Odynophagia, resolved Oral candidasis, improving Suspect pharyngeal candidiasis Continue Diflucan, magic mouthwash Diet per speech therapy as above (liquid diet: nectar thick; pureed(dysphagia 1). Previously complicating her aspiration pneumonia/pneumonitis but patient continues to have issues despite resolution of pain and improvement in candidiasis   FTT Generalized weakness Encourage diet when awake and able to tolerate p.o. safely PT recommending SNF, discharge likely home with hospice versus  palliative care as above   Hyponatremia,  hypovolemic Hypokalemia Getting some edema. We will stop IVF and give a dose of her home lasix 20mg  po. Concomitantly will supplement potassium BID with packet formulation per discussion with family.    Recurrent UTI Urinary retention? Multiple rounds of abx at home.  Completed empiric antibiotics for aspiration pneumonia as above.  No culture sent based on urinalysis. Urinary retention resolved overnight - multiple episodes without need for intervention  Tyrone Nine, MD [facility] [email or web address] 10/19/2022, 1:55 PM

## 2022-10-19 NOTE — NC FL2 (Signed)
[place] MEDICAID FL2 LEVEL OF CARE FORM     IDENTIFICATION  Patient Name: Barbara Nelson Birthdate: [birth year] Sex: female Admission Date (Current Location): 10/11/2022  [facility] and [place] Number:  Producer, television/film/video and Address:  [facility],  [address] [place], [place] [address]      Provider Number: [id number]  Attending Physician Name and Address:  Tyrone Nine, MD  Relative Name and Phone Number:  daughter, Ricky Stabs @ [phone]    Current Level of Care: Hospital Recommended Level of Care: Skilled Nursing Facility Prior Approval Number:    Date Approved/Denied:   PASRR Number: [id number]  Discharge Plan: SNF    Current Diagnoses: Patient Active Problem List   Diagnosis Date Noted   Oral candidiasis 10/12/2022   FTT (failure to thrive) in adult 10/12/2022   Generalized weakness 10/12/2022   Aspiration pneumonia (HCC) 10/11/2022   Urinary retention 07/27/2022   Nausea and vomiting 07/10/2022   Pressure injury of skin 07/10/2022   Unspecified atrial fibrillation (HCC) 07/01/2022   Acute metabolic encephalopathy 06/30/2022   Hypercalcemia 06/30/2022   Dehydration 06/30/2022   Acute prerenal azotemia 06/30/2022   Transaminitis 06/30/2022   Constipation 06/30/2022   DM2 (diabetes mellitus, type 2) (HCC) 06/30/2022   Allergic rhinitis 06/30/2022   Chronic diastolic CHF (congestive heart failure) (HCC) 06/30/2022   Shortness of breath 06/20/2022   Acute hypoxic respiratory failure (HCC) 06/18/2022   Hyponatremia 06/18/2022   Body mass index (BMI) 39.0-39.9, adult 01/05/2022   Decreased estrogen level 01/05/2022   Prediabetes 01/05/2022   Dry eyes 01/05/2022   History of pericarditis 01/05/2022   Essential hypertension 01/05/2022   LBBB (left bundle branch block) 09/17/2017   Benign essential HTN 10/31/2015   Heart palpitations 10/31/2015   Lung mass 06/27/2013   Edema of extremities 06/27/2013   Pericarditis, viral     History of pericardiectomy 06/19/2013   Chronic pulmonary blastomycosis (HCC) 08/23/2012   Pulmonary nodule, right 03/09/2012   Pericardial effusion/ pericarditis 10/06/2011    Orientation RESPIRATION BLADDER Height & Weight     Self, Place, Time  O2 Incontinent, External catheter Weight: 199 lb 15.3 oz (90.7 kg) Height:  5\' 6"  (167.6 cm)  BEHAVIORAL SYMPTOMS/MOOD NEUROLOGICAL BOWEL NUTRITION STATUS      Incontinent Diet (Dys 1, nectar thick liquids)  AMBULATORY STATUS COMMUNICATION OF NEEDS Skin   Extensive Assist Verbally Other (Comment) (irritant dermatitis buttocks)                       Personal Care Assistance Level of Assistance  Bathing, Feeding, Dressing Bathing Assistance: Limited assistance Feeding assistance: Limited assistance Dressing Assistance: Limited assistance     Functional Limitations Info  Sight, Hearing, Speech Sight Info: Adequate Hearing Info: Adequate Speech Info: Adequate    SPECIAL CARE FACTORS FREQUENCY  PT (By licensed PT), OT (By licensed OT)     PT Frequency: 5x/wk OT Frequency: 5x/wk            Contractures Contractures Info: Not present    Additional Factors Info  Code Status, Allergies Code Status Info: DNR Allergies Info: Latex, Penicillins, Tape, Z-pak (Azithromycin), Dairycare (Lactase-lactobacillus), Egg-derived Products, Potassium, Amoxil (Amoxicillin), Sulfa Antibiotics           Current Medications (10/19/2022):  This is the current hospital active medication list Current Facility-Administered Medications  Medication Dose Route Frequency Provider Last Rate Last Admin   acetaminophen (TYLENOL) suppository 650 mg  650 mg Rectal Q6H PRN Brendia Sacks  P, MD   650 mg at 10/15/22 1039   acetaminophen (TYLENOL) tablet 650 mg  650 mg Oral TID Ernie Avena, NP   650 mg at 10/18/22 2240   albuterol (PROVENTIL) (2.5 MG/3ML) 0.083% nebulizer solution 2.5 mg  2.5 mg Nebulization Q2H PRN Kirby Crigler, Mir M, MD   2.5 mg  at 10/18/22 1735   enoxaparin (LOVENOX) injection 40 mg  40 mg Subcutaneous Q24H Kirby Crigler, Mir M, MD   40 mg at 10/18/22 2240   fluconazole (DIFLUCAN) IVPB 200 mg  200 mg Intravenous Daily Norva Pavlov, RPH 100 mL/hr at 10/18/22 1320 200 mg at 10/18/22 1320   food thickener (SIMPLYTHICK (NECTAR/LEVEL 2/MILDLY THICK)) 1 packet  1 packet Oral PRN Azucena Fallen, MD       guaiFENesin-dextromethorphan (ROBITUSSIN DM) 100-10 MG/5ML syrup 5 mL  5 mL Oral Q4H PRN Standley Brooking, MD       ketotifen (ZADITOR) 0.035 % ophthalmic solution 1 drop  1 drop Both Eyes BID Kirby Crigler, Mir M, MD   1 drop at 10/18/22 2240   lactated ringers infusion   Intravenous Continuous Azucena Fallen, MD 75 mL/hr at 10/18/22 2235 New Bag at 10/18/22 2235   magic mouthwash  5 mL Oral QID Standley Brooking, MD   5 mL at 10/18/22 2245   metoprolol succinate (TOPROL-XL) 24 hr tablet 12.5 mg  12.5 mg Oral Daily Kirby Crigler, Mir M, MD   12.5 mg at 10/18/22 2240   ondansetron (ZOFRAN) injection 4 mg  4 mg Intravenous Q6H PRN Kirby Crigler, Mir M, MD       polyethylene glycol (MIRALAX / GLYCOLAX) packet 17 g  17 g Oral BID Azucena Fallen, MD   17 g at 10/16/22 0850   potassium chloride SA (KLOR-CON M) CR tablet 40 mEq  40 mEq Oral Once Azucena Fallen, MD         Discharge Medications: Please see discharge summary for a list of discharge medications.  Relevant Imaging Results:  Relevant Lab Results:   Additional Information SSn: [id number]  Lilton Pare, LCSW

## 2022-10-19 NOTE — Progress Notes (Signed)
Daily Progress Note   Patient Name: Barbara Nelson       Date: 10/19/2022 DOB: [birth year]  Age: 82 y.o. MRN#: [id number] Attending Physician: Tyrone Nine, MD Primary Care Physician: [name], MD Admit Date: 10/11/2022  Reason for Consultation/Follow-up: Establishing goals of care  Subjective:   resting in bed  Family at bedside, goals of care discussions held, see below.     Length of Stay: 6  Current Medications: Scheduled Meds:   acetaminophen  650 mg Oral TID   enoxaparin (LOVENOX) injection  40 mg Subcutaneous Q24H   furosemide  20 mg Oral Daily   ketotifen  1 drop Both Eyes BID   magic mouthwash  5 mL Oral QID   metoprolol succinate  12.5 mg Oral Daily   polyethylene glycol  17 g Oral BID   potassium chloride  20 mEq Oral BID    Continuous Infusions:  fluconazole (DIFLUCAN) IV 200 mg (10/18/22 1320)    PRN Meds: acetaminophen, albuterol, food thickener, guaiFENesin-dextromethorphan, ondansetron (ZOFRAN) IV  Physical Exam         Not as alert this am, family states that she did not rest well overnight.  Resting in bed   oxygen via nasal cannula Not in respiratory distress Has 1-2+ lower extremity edema  Vital Signs: BP 126/70 (BP Location: Left Arm)   Pulse 89   Temp 98.4 F (36.9 C) (Oral)   Resp 17   Ht 5\' 6"  (1.676 m)   Wt 90.7 kg   LMP  (LMP Unknown)   SpO2 100%   BMI 32.27 kg/m  SpO2: SpO2: 100 % O2 Device: O2 Device: Nasal Cannula O2 Flow Rate: O2 Flow Rate (L/min): 4 L/min  Intake/output summary:  Intake/Output Summary (Last 24 hours) at 10/19/2022 1238 Last data filed at 10/19/2022 0750 Gross per 24 hour  Intake 1529.49 ml  Output 0 ml  Net 1529.49 ml    LBM: Last BM Date : 10/19/22 Baseline Weight: Weight: 90.7 kg Most recent  weight: Weight: 90.7 kg       Palliative Assessment/Data:      Patient Active Problem List   Diagnosis Date Noted   Oral candidiasis 10/12/2022   FTT (failure to thrive) in adult 10/12/2022   Generalized weakness 10/12/2022   Aspiration pneumonia (HCC) 10/11/2022   Urinary retention  07/27/2022   Nausea and vomiting 07/10/2022   Pressure injury of skin 07/10/2022   Unspecified atrial fibrillation (HCC) 07/01/2022   Acute metabolic encephalopathy 06/30/2022   Hypercalcemia 06/30/2022   Dehydration 06/30/2022   Acute prerenal azotemia 06/30/2022   Transaminitis 06/30/2022   Constipation 06/30/2022   DM2 (diabetes mellitus, type 2) (HCC) 06/30/2022   Allergic rhinitis 06/30/2022   Chronic diastolic CHF (congestive heart failure) (HCC) 06/30/2022   Shortness of breath 06/20/2022   Acute hypoxic respiratory failure (HCC) 06/18/2022   Hyponatremia 06/18/2022   Body mass index (BMI) 39.0-39.9, adult 01/05/2022   Decreased estrogen level 01/05/2022   Prediabetes 01/05/2022   Dry eyes 01/05/2022   History of pericarditis 01/05/2022   Essential hypertension 01/05/2022   LBBB (left bundle branch block) 09/17/2017   Benign essential HTN 10/31/2015   Heart palpitations 10/31/2015   Lung mass 06/27/2013   Edema of extremities 06/27/2013   Pericarditis, viral    History of pericardiectomy 06/19/2013   Chronic pulmonary blastomycosis (HCC) 08/23/2012   Pulmonary nodule, right 03/09/2012   Pericardial effusion/ pericarditis 10/06/2011    Palliative Care Assessment & Plan   Patient Profile:    Assessment: 82 year old with aspiration pneumonia constipation generalized deconditioning degenerative joint disease. Recurrent urinary tract infections Gradual physical decline Generalized weakness Poor oral intake   Recommendations/Plan: Full code Goals of care discussions held, disposition options explored, discussed about SNF rehab with palliative on discharge.  TOC to follow  up.  Continue current mode of care     Code Status:    Code Status Orders  (From admission, onward)           Start     Ordered   10/11/22 1132  Full code  Continuous       Question:  By:  Answer:  Consent: discussion documented in EHR   10/11/22 1133           Code Status History     Date Active Date Inactive Code Status Order ID Comments User Context   06/30/2022 2019 07/16/2022 1939 Full Code [id number]  Angie Fava, DO ED   06/18/2022 2010 06/27/2022 0018 Full Code [id number]  Hillary Bow, DO Inpatient       Prognosis:  Unable to determine  Discharge Planning: To Be Determined  Care plan was discussed with  patient and family, TRH MD.   Thank you for allowing the Palliative Medicine Team to assist in the care of this patient.  Low MDM     Greater than 50%  of this time was spent counseling and coordinating care related to the above assessment and plan.  Rosalin Hawking, MD  Please contact Palliative Medicine Team phone at [phone] for questions and concerns.

## 2022-10-19 NOTE — TOC Progression Note (Addendum)
Transition of Care [facility]) - Progression Note    Patient Details  Name: Barbara Nelson MRN: [id number] Date of Birth: 09/18/1940  [facility]) CM/SW Contact  Amada Jupiter, LCSW Phone Number: 10/19/2022, 9:34 AM  Clinical Narrative:     Met with pt's daughters this morning and they have decided they feel pt needs SNF rehab prior to returning home.  We have reviewed preferred SNFs and bed search begun.  ADDENDUM:  Pt/ family have accepted SNF bed offer from [facility] who can admit tomorrow if medically cleared.  MD aware.  Expected Discharge Plan: Home w Home Health Services Barriers to Discharge: Continued Medical Work up  Expected Discharge Plan and Services In-house Referral: Clinical Social Work   Post Acute Care Choice: Home Health Living arrangements for the past 2 months: Single Family Home                 DME Arranged: N/A DME Agency: NA       HH Arranged: PT, OT HH Agency: [employer]         Social Determinants of Health (SDOH) Interventions SDOH Screenings   Food Insecurity: No Food Insecurity (10/11/2022)  Housing: Low Risk  (10/11/2022)  Transportation Needs: No Transportation Needs (10/11/2022)  Utilities: Not At Risk (10/11/2022)  Tobacco Use: Medium Risk (10/11/2022)    Readmission Risk Interventions     No data to display

## 2022-10-20 ENCOUNTER — Inpatient Hospital Stay (HOSPITAL_COMMUNITY): Payer: Medicare Other

## 2022-10-20 DIAGNOSIS — J690 Pneumonitis due to inhalation of food and vomit: Secondary | ICD-10-CM | POA: Diagnosis not present

## 2022-10-20 LAB — BASIC METABOLIC PANEL
Anion gap: 5 (ref 5–15)
BUN: 5 mg/dL — ABNORMAL LOW (ref 8–23)
CO2: 25 mmol/L (ref 22–32)
Calcium: 8.2 mg/dL — ABNORMAL LOW (ref 8.9–10.3)
Chloride: 108 mmol/L (ref 98–111)
Creatinine, Ser: 0.55 mg/dL (ref 0.44–1.00)
GFR, Estimated: >60 mL/min (ref >60)
Glucose, Bld: 74 mg/dL (ref 70–99)
Potassium: 3.2 mmol/L — ABNORMAL LOW (ref 3.5–5.1)
Sodium: 138 mmol/L (ref 135–145)

## 2022-10-20 LAB — MAGNESIUM: Magnesium: 1.6 mg/dL — ABNORMAL LOW (ref 1.7–2.4)

## 2022-10-20 MED ORDER — POTASSIUM CHLORIDE 20 MEQ PO PACK
40.0000 meq | PACK | Freq: Once | ORAL | Status: AC
  Administered 2022-10-20: 40 meq via ORAL
  Filled 2022-10-20: qty 2

## 2022-10-20 MED ORDER — PANTOPRAZOLE SODIUM 40 MG IV SOLR
40.0000 mg | Freq: Every day | INTRAVENOUS | Status: DC
  Administered 2022-10-20 – 2022-10-30 (×11): 40 mg via INTRAVENOUS
  Filled 2022-10-20 (×11): qty 10

## 2022-10-20 MED ORDER — MAGNESIUM SULFATE 2 GM/50ML IV SOLN
2.0000 g | Freq: Once | INTRAVENOUS | Status: AC
  Administered 2022-10-20: 2 g via INTRAVENOUS
  Filled 2022-10-20: qty 50

## 2022-10-20 MED ORDER — POTASSIUM CHLORIDE 10 MEQ/100ML IV SOLN
10.0000 meq | INTRAVENOUS | Status: DC
  Filled 2022-10-20: qty 100

## 2022-10-20 MED ORDER — FUROSEMIDE 10 MG/ML IJ SOLN
20.0000 mg | Freq: Once | INTRAMUSCULAR | Status: AC
  Administered 2022-10-20: 20 mg via INTRAVENOUS
  Filled 2022-10-20: qty 2

## 2022-10-20 NOTE — Progress Notes (Signed)
  Daily Progress Note   Patient Name: Barbara Nelson       Date: 10/20/2022 DOB: April 04, 1941  Age: 82 y.o. MRN#: [id number] Attending Physician: Tyrone Nine, MD Primary Care Physician: [name], MD Admit Date: 10/11/2022 Length of Stay: 7 days  Reviewed EMR for today. Patient continuing to work with PT/OT to proceed with plan for rehab. Pt/family have accepted bed offer from [facility] though awaiting medical clearance. Current plan is for patient to discharge to rehab with outpatient palliative medicine support. Palliative medicine team continuing to follow along with patient's medical journey in the inpatient setting. Please reach out if acute PMT needs arise. Thank you.    Alvester Morin, DO Palliative Care Provider PMT # [phone]

## 2022-10-20 NOTE — Progress Notes (Signed)
[facility] PROGRESS NOTE  Barbara Nelson (DOB: [birth year]) BMW:[id number] PCP: [name], MD  Brief Narrative: Barbara Nelson is an 82 year old woman lives with daughter, PMH recurrent UTI since February with gradual physical decline, who presented with generalized weakness, poor oral intake, confusion, difficulty swallowing with choking and coughing. Admitted for aspiration pneumonia. Possible UTI. History of multiple episodes of pneumonia and UTI since February.    Patient admitted as above with recurrent episode of pneumonia with moderate hypoxia and respiratory distress as well as concurrent questionable UTI.  Patient completed antibiotic course but continues to have respiratory symptoms and hypoxia.  Speech evaluation improved that patient does have moderately profound dysphagia requiring nectar thick liquids which has limited her p.o. intake in both the caloric and free water aspects.  Given patient's poor nutritional status, ongoing aspiration, and concern for failure to thrive palliative care was consulted.    We have treated her with antibiotics, given supplemental oxygen, and she is overall stabilizing. Down to 2L O2. Once stable, we plan discharge to SNF with palliative care services following.   Subjective: Drowsy again today though her interactivity comes and goes, was very alert yesterday evening. Complained of sore throat previously. Had some coughing that went away after chest PT.   Objective: BP (!) 141/84 (BP Location: Left Arm)   Pulse 79   Temp 98.2 F (36.8 C) (Oral)   Resp 17   Ht 5\' 6"  (1.676 m)   Wt 90.7 kg   LMP  (LMP Unknown)   SpO2 97%   BMI 32.27 kg/m   Gen: Elderly frail female in no distress Pulm: End-expiratory wheezing which may be upper airway transmission, no stridor. Nonlabored, normal rate on 2L O2 SpO2 97-98%.   CV: RRR, distant, no MRG, trace pitting dependent edema. GI: Soft, NT, ND, +BS Neuro: Drowsy, not interactive. Ext:  Warm, no deformities Skin: No new rashes, lesions or ulcers on visualized skin   Assessment & Plan: Principal Problem:   Aspiration pneumonia (HCC) Active Problems:   Oral candidiasis   FTT (failure to thrive) in adult   Generalized weakness   Malnutrition of moderate degree  Goals of Care Lengthy discussion at bedside with family for expectation setting. Palliative care following, family understands patient's clinical status will likely be dictated by her nutritional status which is quite poor. Patient has made herself DNI as of 10/18/2022    Sepsis secondary to aspiration pneumonia, POA, resolved:Sepsis criteria given tachypnea and leukopenia Acute hypoxic respiratory failure, POA, improving Completed empiric antibiotics.   Continue to wean oxygen as tolerated, anticipate some level of requirement going forward given our suspicion for aspiration and possible ongoing silent aspiration events. Down to 2L as of 6/4. SLP following - recs: Full liquid diet/Dysphagia 1(pureed)+level 2 thickened liquids Family is rather clear on this being an abrupt change in her ability to tolerate po. Barium swallow study report reviewed. Her discoordination and cognitive status is impacting her swallowing to the largest degree. Will check CT head now that we're further out from the time of abrupt change to evaluate for stroke during that time.  Down to 2L O2, though this is new oxygen requirement. Repeat CXR, though respiratory status is improving overall. We have discussed the possible need for supplemental oxygen going forward. Really needs to cooperate with frequent incentive spirometry.    Acute metabolic encephalopathy versus toxic encephalopathy, improving Patient has been struggling with multiple episodes of pneumonia and UTI since February.   Has had instances at home  where she "sleeps half the week", has had multiple rounds of antibiotics with minimal to no improvement. At this point given patient's  above diagnosis patient's multiple pneumonias are likely aspiration in origin   Odynophagia, resolved Oral candidasis, improving Suspect pharyngeal candidiasis Continue diflucan, magic mouthwash Diet per speech therapy as above (liquid diet: nectar thick; pureed(dysphagia 1). Previously complicating her aspiration pneumonia/pneumonitis but patient continues to have issues despite resolution of pain and improvement in candidiasis. Add PPI empirically. Will defer GI evaluation (e.g. EGD) at this point given her lack of symptoms that would suggest esophageal stricture and her procedural risk.   FTT Generalized weakness Encourage diet when awake and able to tolerate p.o. safely PT recommending SNF, palliative care will follow the patient at that venue.    Hyponatremia: Resolved  Hypokalemia Attempt supplementation by powder again (was uncooperative yesterday) per discussion with family, will continue to attempt to avoid IV K.  Supplement hypomagnesemia today as well and continue monitoring.   Edema: Patient takes lasix 20mg  daily, recent echo showed LVEF 60-65%, LVH with G1DD.  Stopped IVF, gave lasix 6/3, will repeat 6/4, anticipate we can convert back to her home po lasix 6/5.    Recurrent UTI Urinary retention? Multiple rounds of abx at home.  Completed empiric antibiotics for aspiration pneumonia as above.  No culture sent based on urinalysis. Urinary retention resolved overnight - multiple episodes without need for intervention  Tyrone Nine, MD [facility] [email or web address] 10/20/2022, 12:32 PM

## 2022-10-20 NOTE — Care Management Important Message (Signed)
Important Message  Patient Details IM Letter given. Name: Barbara Nelson MRN: [id number] Date of Birth: [birth year]   Medicare Important Message Given:  Yes     Caren Macadam 10/20/2022, 3:48 PM

## 2022-10-20 NOTE — Plan of Care (Signed)
  Problem: Education: Goal: Knowledge of General Education information will improve Description Including pain rating scale, medication(s)/side effects and non-pharmacologic comfort measures Outcome: Progressing   Problem: Health Behavior/Discharge Planning: Goal: Ability to manage health-related needs will improve Outcome: Progressing   

## 2022-10-20 NOTE — Progress Notes (Addendum)
Calorie Count Note  48 hour calorie count ordered.  Diet: DYS 1, Nectar thickened liquids Supplements: Magic Cup BID, ProSource Plus TID  Day 1 (6/3): Breakfast: 0% Lunch: 0% Dinner: 0% Supplements: 1 ProSource Plus  Total intake: 100 kcal (6% of minimum estimated needs)  15 protein (20% of minimum estimated needs) *All kcal/protein from nutrition supplement   Subjective: Per discussion with RN, patient too lethargic yesterday to eat any meals. Family was able to provide her ProSource Plus last night.  Nutrition Dx: Moderate Malnutrition related to chronic illness as evidenced by energy intake < 75% for > or equal to 1 month, percent weight loss (9.5% in 5 months).   Goal: Patient will meet greater than or equal to 90% of their needs    Intervention:  - Calorie count to continue through the end of today (6/4).              - Please document % intake of all foods, drinks, supplements patient consumes on meal tickets and place in envelope on door.    - DYS 1 diet with Nectar thickened liquids per SLP recs.  - Assist with feeding patient order to promote intake.  - Automatic house trays to ensure patient receives 3 meals a day.    - ProSource Plus TID, each supplement provides 100 kcal and 15 grams of protein - Magic cup BID with meals, each supplement provides 290 kcal and 9 grams of protein   - Multivitamin with minerals daily to support micronutrient needs.    - Monitor weight trends.   Shelle Iron RD, LDN For contact information, refer to [facility].

## 2022-10-20 NOTE — Progress Notes (Signed)
Occupational Therapy Treatment Patient Details Name: ZORIANA LIMBAUGH MRN: [id number] DOB: 1940-06-05 Today's Date: 10/20/2022   History of present illness Patient is a 83 year old female who presented with decreased urinary output, difficulty with mobility and swallowing. Patient was admitted with aspiration pneumonia, hyponatremia, PMH: HTN, recent UTI.   OT comments  Patient was noted to make slow progress towards goals. Patient was able to progress sitting EOB to min guard with increased time. Patient needed mod A to regain sitting balance and TD for sitting balance initially. Patient's discharge plan remains appropriate at this time. OT will continue to follow acutely.     Recommendations for follow up therapy are one component of a multi-disciplinary discharge planning process, led by the attending physician.  Recommendations may be updated based on patient status, additional functional criteria and insurance authorization.    Assistance Recommended at Discharge Frequent or constant Supervision/Assistance  Patient can return home with the following  Two people to help with walking and/or transfers;Two people to help with bathing/dressing/bathroom;Assistance with cooking/housework;Direct supervision/assist for medications management;Assist for transportation;Help with stairs or ramp for entrance;Direct supervision/assist for financial management         Precautions / Restrictions Precautions Precautions: Fall Restrictions Weight Bearing Restrictions: No       Mobility Bed Mobility Overal bed mobility: Needs Assistance Bed Mobility: Supine to Sit, Sit to Supine Rolling: Max assist   Supine to sit: HOB elevated, +2 for physical assistance, +2 for safety/equipment, Total assist Sit to supine: Total assist, +2 for physical assistance, +2 for safety/equipment   General bed mobility comments: patient and family were educated on engaging patient in movements rather than TD to EOB  without giving patient opportunity to assist with task.           Balance Overall balance assessment: Needs assistance Sitting-balance support: Feet supported, Bilateral upper extremity supported Sitting balance-Leahy Scale: Poor Sitting balance - Comments: patient was able to sit EOB with TD progressing to min guard. Postural control: Posterior lean, Right lateral lean             ADL either performed or assessed with clinical judgement   ADL Overall ADL's : Needs assistance/impaired       Toileting- Clothing Manipulation and Hygiene: Total assistance;Bed level;+2 for physical assistance;Maximal assistance;+2 for safety/equipment Toileting - Clothing Manipulation Details (indicate cue type and reason): to roll to change pad under patient and change adult absorbent undergarment that family requested patient wear.       General ADL Comments: patient was able to engage in sitting EOB with TD initally with lateral lean on R side with increased time to transition to min guard sitting EOB with evident posterior pelvic tilt and occasional mod A to regain sitting balance. patient's family reported that she sat with them for "over an hour with no physical A". this has not been observed by this therapist. patient was able to lift head minimally during session and sit without kyphotic, posterior plevic tilt for a few seocnds at a time. patient remains lethargic during session with patient opening eyes at times but majoirty keeping them closed. patients family was educated on importance of positioning of head in bed with patient continuing to have lateral lean to R. patient positioned with pillow and rolled towel to position head closer to midline position. family verbalized understanding. patient's family was educated on gentle neck ROM to encourage with patient when she is awake with emphasis on not forcing neck movements. patients family verbalzied understanding.  Cognition  Arousal/Alertness: Lethargic Behavior During Therapy: Flat affect Overall Cognitive Status: Difficult to assess             General Comments: family in room, patient minimal with vocalizations during session.                   Pertinent Vitals/ Pain       Pain Assessment Pain Assessment: Faces Faces Pain Scale: Hurts a little bit Pain Location: bilateral knees Pain Descriptors / Indicators: Grimacing, Guarding Pain Intervention(s): Limited activity within patient's tolerance, Monitored during session, Premedicated before session         Frequency  Min 2X/week        Progress Toward Goals  OT Goals(current goals can now be found in the care plan section)  Progress towards OT goals: Progressing toward goals     Plan Discharge plan remains appropriate       AM-PAC OT "6 Clicks" Daily Activity     Outcome Measure   Help from another person eating meals?: Total Help from another person taking care of personal grooming?: Total Help from another person toileting, which includes using toliet, bedpan, or urinal?: Total Help from another person bathing (including washing, rinsing, drying)?: Total Help from another person to put on and taking off regular upper body clothing?: Total Help from another person to put on and taking off regular lower body clothing?: Total 6 Click Score: 6    End of Session    OT Visit Diagnosis: Pain;Muscle weakness (generalized) (M62.81);Other symptoms and signs involving the nervous system (R29.898)   Activity Tolerance Patient limited by lethargy   Patient Left in bed;with call bell/phone within reach;with bed alarm set;with family/visitor present   Nurse Communication Other (comment) (ok to paritipate)        Time: 0981-1914 OT Time Calculation (min): 44 min  Charges: OT General Charges $OT Visit: 1 Visit OT Treatments $Therapeutic Activity: 38-52 mins  Rosalio Loud, MS Acute Rehabilitation Department Office#  [phone]   Selinda Flavin 10/20/2022, 1:47 PM

## 2022-10-21 DIAGNOSIS — R627 Adult failure to thrive: Secondary | ICD-10-CM | POA: Diagnosis not present

## 2022-10-21 DIAGNOSIS — R34 Anuria and oliguria: Secondary | ICD-10-CM | POA: Diagnosis not present

## 2022-10-21 DIAGNOSIS — R410 Disorientation, unspecified: Secondary | ICD-10-CM | POA: Diagnosis not present

## 2022-10-21 DIAGNOSIS — Z66 Do not resuscitate: Secondary | ICD-10-CM

## 2022-10-21 DIAGNOSIS — Z7189 Other specified counseling: Secondary | ICD-10-CM

## 2022-10-21 DIAGNOSIS — J189 Pneumonia, unspecified organism: Secondary | ICD-10-CM

## 2022-10-21 DIAGNOSIS — Z515 Encounter for palliative care: Secondary | ICD-10-CM

## 2022-10-21 DIAGNOSIS — R29898 Other symptoms and signs involving the musculoskeletal system: Secondary | ICD-10-CM

## 2022-10-21 DIAGNOSIS — E440 Moderate protein-calorie malnutrition: Secondary | ICD-10-CM

## 2022-10-21 DIAGNOSIS — J690 Pneumonitis due to inhalation of food and vomit: Secondary | ICD-10-CM | POA: Diagnosis not present

## 2022-10-21 LAB — CBC
HCT: 32.5 % — ABNORMAL LOW (ref 36.0–46.0)
Hemoglobin: 10.9 g/dL — ABNORMAL LOW (ref 12.0–15.0)
MCH: 28.5 pg (ref 26.0–34.0)
MCHC: 33.5 g/dL (ref 30.0–36.0)
MCV: 84.9 fL (ref 80.0–100.0)
Platelets: 123 10*3/uL — ABNORMAL LOW (ref 150–400)
RBC: 3.83 MIL/uL — ABNORMAL LOW (ref 3.87–5.11)
RDW: 17.7 % — ABNORMAL HIGH (ref 11.5–15.5)
WBC: 6.6 10*3/uL (ref 4.0–10.5)
nRBC: 0 % (ref 0.0–0.2)

## 2022-10-21 LAB — COMPREHENSIVE METABOLIC PANEL
ALT: 20 U/L (ref 0–44)
AST: 36 U/L (ref 15–41)
Albumin: 1.5 g/dL — ABNORMAL LOW (ref 3.5–5.0)
Alkaline Phosphatase: 168 U/L — ABNORMAL HIGH (ref 38–126)
Anion gap: 7 (ref 5–15)
BUN: 6 mg/dL — ABNORMAL LOW (ref 8–23)
CO2: 27 mmol/L (ref 22–32)
Calcium: 8.4 mg/dL — ABNORMAL LOW (ref 8.9–10.3)
Chloride: 106 mmol/L (ref 98–111)
Creatinine, Ser: 0.57 mg/dL (ref 0.44–1.00)
GFR, Estimated: >60 mL/min (ref >60)
Glucose, Bld: 82 mg/dL (ref 70–99)
Potassium: 3 mmol/L — ABNORMAL LOW (ref 3.5–5.1)
Sodium: 140 mmol/L (ref 135–145)
Total Bilirubin: 0.4 mg/dL (ref 0.3–1.2)
Total Protein: 5.2 g/dL — ABNORMAL LOW (ref 6.5–8.1)

## 2022-10-21 LAB — MAGNESIUM: Magnesium: 1.9 mg/dL (ref 1.7–2.4)

## 2022-10-21 LAB — BRAIN NATRIURETIC PEPTIDE: B Natriuretic Peptide: 162.5 pg/mL — ABNORMAL HIGH (ref 0.0–100.0)

## 2022-10-21 MED ORDER — POTASSIUM CHLORIDE 10 MEQ/100ML IV SOLN
10.0000 meq | INTRAVENOUS | Status: AC
  Administered 2022-10-21 (×5): 10 meq via INTRAVENOUS
  Filled 2022-10-21 (×2): qty 100

## 2022-10-21 MED ORDER — SUCRALFATE 1 GM/10ML PO SUSP
1.0000 g | Freq: Three times a day (TID) | ORAL | Status: DC
  Filled 2022-10-21: qty 10

## 2022-10-21 MED ORDER — FUROSEMIDE 10 MG/ML IJ SOLN
20.0000 mg | Freq: Once | INTRAMUSCULAR | Status: AC
  Administered 2022-10-21: 20 mg via INTRAVENOUS
  Filled 2022-10-21: qty 2

## 2022-10-21 NOTE — Progress Notes (Signed)
Daily Progress Note   Patient Name: Barbara Nelson       Date: 10/21/2022 DOB: [birth year]  Age: 82 y.o. MRN#: [id number] Attending Physician: Cathren Harsh, MD Primary Care Physician: [name], MD Admit Date: 10/11/2022 Length of Stay: 8 days  Reason for Consultation/Follow-up: Establishing goals of care  Subjective:   CC: Patient laying in bed and will not interact with this provider. Multiple family members at bedside.   Subjective:  Reviewed EMR.  GI now consulted for evaluation.  Patient had previously been accepted to rehab though holding on this for medical clearance and further workup. Discussed care with GI, hospitalist, and RN prior to seeing patient.  Presented to bedside later in afternoon.  Patient laying comfortably in bed receiving chest physiotherapy.   Multiple family members at bedside including patient's daughter and 2 sisters.  Introduced myself as a member of the palliative medicine team. Patient would not interact with this provider.  Patient will not open eyes.  Family notes that patient is reluctant to interact with medical providers; voiced understanding with this.  I did examine patient's mouth though patient would not open her mouth for examination or follow commands to do so.  Patient did turn her head towards family when they were speaking to her though would not follow their commands to open her eyes.  Family able to update me on recent medical developments.  Noted patient's Lasix had been discontinued and so she had worsening shortness of breath though now this has been restarted, breathing appears to have improved.  Noted they are working on patient's eating at this point.  Patient has complained to family of pain when swallowing.  Family is very happy GI is now involved in recommending other medications and possible EGD workup.  Family easily able to express their hope to "fix the fixable" before patient goes to rehab.  Acknowledged this.  Spent time  providing emotional support via active listening.  Noted palliative medicine team would continue to follow along with patient's care.  Updated hospitalist and RN after visit with patient's family.  Review of Systems  Objective:   Vital Signs:  BP 126/72 (BP Location: Left Arm)   Pulse 87   Temp 98.3 F (36.8 C) (Axillary)   Resp 16   Ht 5\' 6"  (1.676 m)   Wt 90.7 kg   LMP  (LMP Unknown)   SpO2 97%   BMI 32.27 kg/m   Physical Exam: General: NAD, will not interact with this provider, chronically ill-appearing Eyes: No drainage noted HENT: Patient would not open her mouth for this provider to examine oral cavity Cardiovascular: RRR, edema in LE b/l Respiratory: no increase Neuro: Will not participate in conversation with medical provider  Imaging:  I personally reviewed recent imaging.   Assessment & Plan:   Assessment: Patient is an 82 year old female with a past medical history of aspiration pneumonia, constipation, generalized deconditioning, degenerative joint disease, and recurrent urinary tract infections who was admitted on 10/11/2022 for management worsening malaise and weakness for the past few weeks.  Since admission patient has received management for aspiration pneumonia and sepsis secondary to this, odynophagia with question of candidiasis, and electrolyte abnormalities.  Hospitalization complicated by poor oral intake, encephalopathy, and inability to consistently along with work with therapy.  Palliative medicine team consulted to assist with complex medical decision making.  Recommendations/Plan: # Complex medical decision making/goals of care:  - Patient will not interact during visit today to participate in complex medical decision making.  Unsure if this is due to encephalopathy versus lack of interest to interact with provider.  Patient would follow family's command to turn her head towards them to interact with them though would not open her eyes to  command.  -Discussed care with family members at bedside as described above in HPI.  Family is glad there is further workup being done regarding her oral intake/difficulties swallowing.  Family notes they want to "fix the fixable" before patient goes to rehab.  Acknowledged this and wishes for medical care at this time.  Noted palliative medicine team will continue to follow along as able to discuss care moving forward.  -  Code Status: DNR  # Symptom management:  -As per primary hospitalist  # Psychosocial Support:  -Multiple family members at bedside. Two daughters noted in EMR.   # Discharge Planning: Patient has been accepted to rehab. Awaiting medical clearance.   -PMT has already recommended outpatient palliative medicine follow up with rehab.   Discussed with: GI, hospitalist, RN, patient/patient's family at bedside  Thank you for allowing the palliative care team to participate in the care Elba Barman.  Alvester Morin, DO Palliative Care Provider PMT # [phone]  If patient remains symptomatic despite maximum doses, please call PMT at [phone] between 0700 and 1900. Outside of these hours, please call attending, as PMT does not have night coverage.  This provider spent a total of 35 minutes providing patient's care.  Includes review of EMR, discussing care with other staff members involved in patient's medical care, obtaining relevant history and information from patient and/or patient's family, and personal review of imaging and lab work. Greater than 50% of the time was spent counseling and coordinating care related to the above assessment and plan.    *Please note that this is a verbal dictation therefore any spelling or grammatical errors are due to the "[employer]" system interpretation.

## 2022-10-21 NOTE — Consult Note (Signed)
[facility] Gastroenterology Consult  Referring Provider: No ref. provider found Primary Care Physician:  [name], MD Primary Gastroenterologist: [facility]  Reason for Consultation: dysphagia  SUBJECTIVE:   HPI: Barbara Nelson is a 82 y.o. female with past medical history significant for pulmonary blastomycosis status post right lobectomy, chronic respiratory failure on supplemental oxygen (2L), congestive heart failure, hypertension, diabetes mellitus. Seen by [facility] in February 2024 for fecal impaction which improved with supportive measures. Also with concern for oral candidiasis and treated with azole therapy.   Presented to hospital on 10/11/22 with concern for UTI. Has undergone workup for dysphagia including modified barium swallow by Speech Language Pathology on 10/14/22. Findings at that time showed oral transitioning significantly impaired, attempts at mastication ineffective, secretion retention in pharynx, thin liquid aspiration. Patient was noted to be somnolent during evaluation, which was improved from prior mental state per nursing. During her hospital stay, she has also received diflucan for presumed candidiasis.   Patient was unable to interact during examination with me today. She was very somnolent, would not open eyes. She did grimace to pain when repositioned in bed. Patient's nurse aide at bedside contacted patient's daughters over telephone today and we discussed possibilities for treatment for their mother. They noted that she has been experiencing some odynophagia. Based on SLP evaluation, I do not think that patient would tolerate a full barium swallow test. She has multiple risk factors for EGD and I am not certain that an etiology of odynophagia would be found during procedure. I recommended optimizing medical therapy for odynophagia and consider EGD only if benefits outweigh risks. They were appreciative of discussion and in agreement to optimize medication  therapy.  Past Medical History:  Diagnosis Date   Benign essential HTN 10/31/2015   Cardiac tamponade due to viral pericarditis    LBBB (left bundle branch block)    Lung mass    fungal infection s/p resection at [facility]   Postoperative atrial fibrillation [facility])    Pulmonary nodule    Past Surgical History:  Procedure Laterality Date   ABDOMINAL HYSTERECTOMY  1988   chest tube placement  09/03/11   LUNG REMOVAL, PARTIAL     right knee arthoscopy with meniscal repair     subxiphoid pericardial window and drainage anterior partial pericardiectomy  09/03/11   Prior to Admission medications   Medication Sig Start Date End Date Taking? Authorizing Provider  acetaminophen (TYLENOL) 325 MG tablet Take 650 mg by mouth every 6 (six) hours as needed for mild pain or moderate pain.   Yes [provider]  azelastine (OPTIVAR) 0.05 % ophthalmic solution Apply 1 drop to eye 2 (two) times daily. 09/16/22  Yes [provider]  benzonatate (TESSALON) 100 MG capsule Take 1 capsule (100 mg total) by mouth every 8 (eight) hours. Patient taking differently: Take 100 mg by mouth every 8 (eight) hours as needed for cough. 10/01/22  Yes Plunkett, Alphonzo Lemmings, MD  doxycycline (VIBRAMYCIN) 100 MG capsule Take 100 mg by mouth 2 (two) times daily.   Yes [provider]  furosemide (LASIX) 20 MG tablet Take 1 tablet (20 mg total) by mouth daily. 07/21/22 10/11/22 Yes [name], MD  metoprolol succinate (TOPROL-XL) 25 MG 24 hr tablet Take 0.5 tablets (12.5 mg total) by mouth daily. Patient taking differently: Take 12.5 mg by mouth at bedtime. 06/26/22  Yes [name], [name], MD  Multiple Vitamin (MULTIVITAMIN WITH MINERALS) TABS tablet Take 1 tablet by mouth daily.   Yes [provider]  olopatadine (PATADAY) 0.1 % ophthalmic solution Place 1 drop into both eyes daily as needed for allergies.   Yes [provider]  polyethylene glycol powder (MIRALAX) 17 GM/SCOOP powder Take 17 g  by mouth 2 (two) times daily. Decrease to once daily if having watery stools. 07/16/22  Yes [name], MD  triamcinolone (KENALOG) 0.025 % ointment Apply 1 Application topically 2 (two) times daily. Upper back, arms, legs 08/13/22  Yes [provider]  ciprofloxacin (CIPRO) 500 MG tablet Take 500 mg by mouth daily. Patient not taking: Reported on 10/11/2022 09/22/22   [provider]  fluconazole (DIFLUCAN) 100 MG tablet Take 100 mg by mouth once. Patient not taking: Reported on 10/11/2022    [provider]  fosfomycin (MONUROL) 3 g PACK Take 3 g by mouth once. Patient not taking: Reported on 10/11/2022 10/04/22   [provider]  hydrochlorothiazide (HYDRODIURIL) 12.5 MG tablet Take 12.5 mg by mouth daily. Patient not taking: Reported on 10/11/2022 08/16/22   [provider]  nitrofurantoin (MACRODANTIN) 100 MG capsule Take 100 mg by mouth every 12 (twelve) hours. Patient not taking: Reported on 10/11/2022 09/11/22   [provider]  nitrofurantoin, macrocrystal-monohydrate, (MACROBID) 100 MG capsule Take 1 capsule (100 mg total) by mouth every 12 (twelve) hours. Patient not taking: Reported on 10/11/2022 08/13/22   Milderd Meager., MD  nystatin (MYCOSTATIN) 100000 UNIT/ML suspension Take 5 mLs by mouth 4 (four) times daily. Patient not taking: Reported on 10/11/2022 09/13/22   [provider]   Current Facility-Administered Medications  Medication Dose Route Frequency Provider Last Rate Last Admin   (feeding supplement) PROSource Plus liquid 30 mL  30 mL Oral TID BM Hazeline Junker B, MD   30 mL at 10/21/22 1013   acetaminophen (TYLENOL) suppository 650 mg  650 mg Rectal Q6H PRN Standley Brooking, MD   650 mg at 10/19/22 1322   acetaminophen (TYLENOL) tablet 650 mg  650 mg Oral TID Ernie Avena, NP   650 mg at 10/21/22 1012   albuterol (PROVENTIL) (2.5 MG/3ML) 0.083% nebulizer solution 2.5 mg  2.5 mg Nebulization Q2H PRN  Kirby Crigler, Mir M, MD   2.5 mg at 10/18/22 1735   enoxaparin (LOVENOX) injection 40 mg  40 mg Subcutaneous Q24H Kirby Crigler, Mir M, MD   40 mg at 10/20/22 2218   fluconazole (DIFLUCAN) IVPB 200 mg  200 mg Intravenous Daily Norva Pavlov, RPH 100 mL/hr at 10/20/22 1431 200 mg at 10/20/22 1431   food thickener (SIMPLYTHICK (NECTAR/LEVEL 2/MILDLY THICK)) 1 packet  1 packet Oral PRN Azucena Fallen, MD       guaiFENesin-dextromethorphan (ROBITUSSIN DM) 100-10 MG/5ML syrup 5 mL  5 mL Oral Q4H PRN Standley Brooking, MD       ketotifen (ZADITOR) 0.035 % ophthalmic solution 1 drop  1 drop Both Eyes BID Kirby Crigler, Mir M, MD   1 drop at 10/21/22 1013   magic mouthwash  5 mL Oral QID Standley Brooking, MD   5 mL at 10/20/22 2219   metoprolol succinate (TOPROL-XL) 24 hr tablet 12.5 mg  12.5 mg Oral Daily Kirby Crigler, Mir M, MD   12.5 mg at 10/20/22 2218   multivitamin with minerals tablet 1 tablet  1 tablet Oral Daily Tyrone Nine, MD   1 tablet at 10/21/22 1012   ondansetron (ZOFRAN) injection 4 mg  4 mg Intravenous Q6H PRN Maryln Gottron, MD       pantoprazole (PROTONIX)  injection 40 mg  40 mg Intravenous Daily Hazeline Junker B, MD   40 mg at 10/21/22 1012   polyethylene glycol (MIRALAX / GLYCOLAX) packet 17 g  17 g Oral BID Azucena Fallen, MD   17 g at 10/20/22 2218   Allergies as of 10/11/2022 - Review Complete 10/11/2022  Allergen Reaction Noted   Latex Rash 08/04/2012   Penicillins Rash and Itching 08/03/2013   Tape Hives and Rash 10/06/2011   Z-pak [azithromycin] Rash and Other (See Comments) 06/28/2014   Egg-derived products Nausea And Vomiting 12/15/2021   Potassium Nausea And Vomiting 08/19/2022   Amoxil [amoxicillin] Rash 06/27/2013   Sulfa antibiotics Rash 10/03/2018   Family History  Problem Relation Age of Onset   Hypertension Father    Stroke Father    Hypertension Mother    Ovarian cancer Mother    Prostate cancer Brother    Thyroid disease Sister     Hypertension Sister    Leukemia Maternal Aunt    Social History   Socioeconomic History   Marital status: Single    Spouse name: Not on file   Number of children: 4   Years of education: Not on file   Highest education level: Not on file  Occupational History   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  Tobacco Use   Smoking status: Former    Packs/day: 1.50    Years: 10.00    Additional pack years: 0.00    Total pack years: 15.00    Types: Cigarettes    Quit date: 05/19/1979    Years since quitting: 43.4   Smokeless tobacco: Never  Vaping Use   Vaping Use: Never used  Substance and Sexual Activity   Alcohol use: No   Drug use: No   Sexual activity: Not on file  Other Topics Concern   Not on file  Social History Narrative   Lives with daughter [name]   Social Determinants of Health   Financial Resource Strain: Not on file  Food Insecurity: No Food Insecurity (10/11/2022)   Hunger Vital Sign    Worried About Running Out of Food in the Last Year: Never true    Ran Out of Food in the Last Year: Never true  Transportation Needs: No Transportation Needs (10/11/2022)   PRAPARE - Administrator, Civil Service (Medical): No    Lack of Transportation (Non-Medical): No  Physical Activity: Not on file  Stress: Not on file  Social Connections: Not on file  Intimate Partner Violence: Not At Risk (10/11/2022)   Humiliation, Afraid, Rape, and Kick questionnaire    Fear of Current or Ex-Partner: No    Emotionally Abused: No    Physically Abused: No    Sexually Abused: No   Review of Systems:  Review of Systems  Reason unable to perform ROS: Patient somnolence.    OBJECTIVE:   Temp:  [97.7 F (36.5 C)-98.6 F (37 C)] 98.3 F (36.8 C) (06/05 0700) Pulse Rate:  [83-91] 87 (06/05 0700) Resp:  [14-20] 16 (06/05 0700) BP: (126-134)/(71-78) 126/72 (06/05 0700) SpO2:  [95 %-99 %] 97 % (06/05 0700) Last BM Date : 10/20/22 Physical Exam Cardiovascular:     Rate and Rhythm:  Normal rate and regular rhythm.  Pulmonary:     Effort: No respiratory distress.     Breath sounds: Normal breath sounds.  Abdominal:     General: Bowel sounds are normal. There is no distension.     Palpations: Abdomen is soft.  Tenderness: There is no abdominal tenderness. There is no guarding.  Neurological:     Mental Status: She is lethargic.     Labs: Recent Labs    10/19/22 0335 10/21/22 0333  WBC 6.6 6.6  HGB 11.1* 10.9*  HCT 32.6* 32.5*  PLT 130* 123*   BMET Recent Labs    10/19/22 0335 10/20/22 0351 10/21/22 0333  NA 136 138 140  K 3.3* 3.2* 3.0*  CL 107 108 106  CO2 26 25 27   GLUCOSE 82 74 82  BUN 5* 5* 6*  CREATININE 0.56 0.55 0.57  CALCIUM 8.2* 8.2* 8.4*   LFT Recent Labs    10/21/22 0333  PROT 5.2*  ALBUMIN 1.5*  AST 36  ALT 20  ALKPHOS 168*  BILITOT 0.4   PT/INR No results for input(s): "LABPROT", "INR" in the last 72 hours.  Diagnostic imaging: DG CHEST PORT 1 VIEW  Result Date: 10/20/2022 CLINICAL DATA:  Acute respiratory failure with hypoxia EXAM: PORTABLE CHEST 1 VIEW COMPARISON:  10/11/2022 FINDINGS: Blunting of both costophrenic angles with substantially increased peripheral density in the right mid chest likely from fluid in the minor fissure and possibly tracking along the pleural margin and major fissure. Mild retrocardiac airspace opacity increased from prior. Thoracic spondylosis. Mild enlargement of the cardiopericardial silhouette. Indistinct pulmonary vasculature with potential cephalization of blood flow favoring pulmonary venous hypertension. IMPRESSION: 1. Mild enlargement of the cardiopericardial silhouette with indistinct pulmonary vasculature and potential cephalization of blood flow favoring pulmonary venous hypertension. 2. Bilateral pleural effusions, increased on the right 3. Increased peripheral density in the right mid chest likely from fluid in the minor fissure and possibly tracking along the major fissure. 4.  Increased retrocardiac airspace opacity potentially from atelectasis or pneumonia. Electronically Signed   By: Gaylyn Rong M.D.   On: 10/20/2022 14:04   CT HEAD WO CONTRAST ( )  Result Date: 10/20/2022 CLINICAL DATA:  Neuro deficit, acute, stroke suspected. EXAM: CT HEAD WITHOUT CONTRAST TECHNIQUE: Contiguous axial images were obtained from the base of the skull through the vertex without intravenous contrast. RADIATION DOSE REDUCTION: This exam was performed according to the departmental dose-optimization program which includes automated exposure control, adjustment of the mA and/or kV according to patient size and/or use of iterative reconstruction technique. COMPARISON:  Head CT 10/11/2022. FINDINGS: Brain: No acute intracranial hemorrhage. Unchanged mild chronic small-vessel disease. Gray-white differentiation is otherwise preserved. No hydrocephalus or extra-axial collection. No mass effect or midline shift. Vascular: No hyperdense vessel or unexpected calcification. Skull: No calvarial fracture or suspicious bone lesion. Skull base is unremarkable. Sinuses/Orbits: Unremarkable. Other: None. IMPRESSION: 1. No acute intracranial abnormality. 2. Unchanged mild chronic small-vessel disease. Electronically Signed   By: Orvan Falconer M.D.   On: 10/20/2022 11:34    IMPRESSION: Dysphagia Odynophagia Impaired oropharyngeal swallow per SLP evaluation, required dietary modification Protein calorie malnutrition History pulmonary blastomycosis s/p R lobectomy Chronic respiratory failure on supplemental oxygen Congestive heart failure Hypertension Diabetes mellitus  PLAN: -Diet per SLP recommendations -Continue IV PPI daily -Continue empiric fluconazole -Start sucralfate suspension QID -Monitor oral intake -[facility] will follow   LOS: 8 days   Liliane Shi, [facility] Gastroenterology

## 2022-10-21 NOTE — Progress Notes (Signed)
Calorie Count Note  48 hour calorie count ordered.   Diet: DYS 1, Nectar thickened liquids Supplements: Magic Cup BID, ProSource Plus TID   Day 1 (6/3): Breakfast: 0% Lunch: 0% Dinner: 0% Supplements: 1 ProSource Plus Total intake: 100 kcal (6% of minimum estimated needs)  15 protein (20% of minimum estimated needs) *All kcal/protein from nutrition supplement  Day 2 (6/4): Breakfast: 0% Lunch: 5% Dinner: none documented Supplements: 2 ProSource Plus Total intake: 230 kcal (14% of minimum estimated needs)  31 protein (41% of minimum estimated needs)  Average intake over the 24 hours:  165 kcal (10% of minimum estimated needs)  23 protein (31% of minimum estimated needs)   Nutrition Dx: Moderate Malnutrition related to chronic illness as evidenced by energy intake < 75% for > or equal to 1 month, percent weight loss (9.5% in 5 months).    Goal: Patient will meet greater than or equal to 90% of their needs  Subjective: Discussed results with MD. At this time, no plan for aggressive nutrition interventions at this time such as nutrition support. Plan for patient to likely discharge to SNF with outpatient palliative medicine support.  Plan to continue current diet with SLP therapy and nutrition supplements. Family encouraging intake.  Intervention:  - DYS 1 diet with Nectar thickened liquids per SLP recs.  - Assist with feeding patient order to promote intake.  - Automatic house trays to ensure patient receives 3 meals a day.    - ProSource Plus TID, each supplement provides 100 kcal and 15 grams of protein - Magic cup BID with meals, each supplement provides 290 kcal and 9 grams of protein   - Multivitamin with minerals daily to support micronutrient needs.     Shelle Iron RD, LDN For contact information, refer to [facility].

## 2022-10-21 NOTE — Progress Notes (Signed)
Mobility Specialist - Progress Note   10/21/22 1431  Mobility  Activity  (Bed Level UE Exercises)  Level of Assistance Dependent, patient does less than 25%  Range of Motion/Exercises Passive;Left arm;Right arm  Activity Response Tolerated well  Mobility Referral Yes  $Mobility charge 1 Mobility  Mobility Specialist Start Time (ACUTE ONLY) 0217  Mobility Specialist Stop Time (ACUTE ONLY) 0230  Mobility Specialist Time Calculation (min) (ACUTE ONLY) 13 min   Pt received in bed and agreeable to do bed level UE exercises. See below for exercises.   Supine BUE Exercises: 5 reps each  1) Elbow Flexion  2) Elbow extension   3) Horizontal Abduction             4) Arm Raises             5) Hand Squeezes  [facility]

## 2022-10-21 NOTE — Progress Notes (Addendum)
SLP Cancellation Note  Patient Details Name: Barbara Nelson MRN: [id number] DOB: [birth year]   Cancelled treatment:       Reason Eval/Treat Not Completed: Other (comment) (RN reports pt rarely opens her eyes and sometimes will take medications with applesauce)  If aligns with GOC, may be beneficial to allow diet as desired for comfort.  Please see SLP prior note  from 10/15/2022 regarding pt's MBS results and h/o dysphagia including pt using thickeners at times at home.     Pt has not followed directions including opening mouth, swallowing on command or swallowing secretions during sessions. She had issues with weakness and malaise for weeks prior to admission per admitting MD note.   Will attempt to see pt for dysphagia exercise if she can/will participate.   Again prognosis for swallow function to return to level that allows adequate nutrition and hydration is guarded at best regardless of diet consistency.     [name], MS [facility] SLP Acute Rehab Services Office [phone]    [name] 10/21/2022, 4:01 PM

## 2022-10-21 NOTE — Progress Notes (Signed)
Mobility Specialist - Progress Note   10/21/22 1010  Mobility  Activity  (Bed Level LE Exercises)  Level of Assistance Dependent, patient does less than 25%  Range of Motion/Exercises Passive;Right leg;Left leg  Activity Response Tolerated well  Mobility Referral Yes  $Mobility charge 1 Mobility  Mobility Specialist Start Time (ACUTE ONLY) [phone]  Mobility Specialist Stop Time (ACUTE ONLY) 1009  Mobility Specialist Time Calculation (min) (ACUTE ONLY) 18 min   Pt received in bed and agreeable to assist in bed level LE exercises. No complaints during session. See below for exercises.  Supine BLE exercises: 10 reps each  1) Ankle Pumps  2) Heel Slides   3) Hip Abduction /Adduction   4) Straight Leg Raise     [place] [facility] Mobility Specialist

## 2022-10-21 NOTE — Progress Notes (Signed)
[facility]                                                                              Barbara Nelson, is a 82 y.o. female, DOB - [birth year], VOZ:[id number] Admit date - 10/11/2022    Outpatient Primary MD for the patient is [name], MD  LOS - 8  days  Chief Complaint  Patient presents with   Urinary Tract Infection       Brief summary   Patient is a 82 year old female, lives with daughter, PMH recurrent UTI since February with gradual physical decline, who presented with generalized weakness, poor oral intake, confusion, difficulty swallowing with choking and coughing. Admitted for aspiration pneumonia. Possible UTI. History of multiple episodes of pneumonia and UTI since February.    Patient admitted as above with recurrent episode of pneumonia with moderate hypoxia and respiratory distress as well as concurrent questionable UTI.  Patient completed antibiotic course but continues to have respiratory symptoms and hypoxia.  Speech evaluation improved that patient does have moderately profound dysphagia requiring nectar thick liquids which has limited her p.o. intake in both the caloric and free water aspects.  Given patient's poor nutritional status, ongoing aspiration, and concern for failure to thrive palliative care was consulted.   Assessment & Plan    Principal Problem:   Aspiration pneumonia (HCC), sepsis POA Acute respiratory failure with hypoxia, POA -Completed empiric antibiotics, wean O2 as tolerated.  Sepsis physiology resolved -Currently O2 sats 97% on 2 L -SLP evaluation recommended dysphagia 1 diet with nectar thick liquids -Daughter stating abrupt change in her ability to tolerate PO's  "she was eating fine before admission", CT head negative for acute CVA or acute pathology. -Patient's daughter is requesting GI evaluation (see below) -Chest x-ray 6/4 showed pulmonary edema, bilateral pleural effusions, increased on the right, increased  retrocardiac airspace opacity atelectasis or pneumonia -BNP 162.5, continue IV Lasix today, although appears to have third spacing from hypoalbuminemia with albumin of 1.5  Active Problems: Odynophagia, oral candidiasis, ?  Pharyngeal and esophageal candidiasis -Currently on Diflucan, started on 5/28, Magic mouthwash -On dysphagia 1 pured diet with nectar thick liquids -Patient continues to have issues with odynophagia despite Diflucan and Magic wash, PPI. -Patient's family, daughter requesting GI evaluation.  GI consulted, Dr. Lorenso Quarry to see -Needs goals of care, palliative medicine following.  Acute metabolic encephalopathy, failure to thrive, generalized debility -Per previous documentation, patient has been struggling with multiple episodes of PNA, UTI since February.  Has had multiple rounds of antibiotics with minimal improvement.   Hyponatremia -Resolved  Hypokalemia -Continue IV replacement as needed    Malnutrition of moderate degree Etiology: chronic illness Signs/Symptoms: energy intake < 75% for > or equal to 1 month, percent weight loss (9.5% in 5 months) Percent weight loss: 9.5 % (in 5 months) Interventions: Refer to RD note for recommendations, Magic cup, Prostat, MVI Estimated body mass index is 32.27 kg/m as calculated from the following:   Height as of this encounter: 5\' 6"  (1.676 m).   Weight as of this encounter: 90.7 kg.  Code Status: DNI DVT Prophylaxis:  enoxaparin (LOVENOX) injection 40 mg Start:  10/11/22 2200 SCDs Start: 10/11/22 1132   Level of Care: Level of care: Med-Surg Family Communication: Updated patient's daughter at the bedside and multiple family members on the speaker phone with conference call Disposition Plan:      Remains inpatient appropriate:      Procedures:    Consultants:   Palliative medicine GI  Antimicrobials:   Anti-infectives (From admission, onward)    Start     Dose/Rate Route Frequency Ordered Stop   10/13/22  1400  fluconazole (DIFLUCAN) IVPB 200 mg       See Hyperspace for full Linked Orders Report.   200 mg 100 mL/hr over 60 Minutes Intravenous Daily 10/12/22 1138     10/13/22 1000  fluconazole (DIFLUCAN) IVPB 100 mg  Status:  Discontinued       See Hyperspace for full Linked Orders Report.   100 mg 50 mL/hr over 60 Minutes Intravenous Daily 10/12/22 1036 10/12/22 1138   10/12/22 1130  fluconazole (DIFLUCAN) IVPB 200 mg       See Hyperspace for full Linked Orders Report.   200 mg 100 mL/hr over 60 Minutes Intravenous  Once 10/12/22 1036 10/13/22 1114   10/12/22 1000  cefTRIAXone (ROCEPHIN) 2 g in sodium chloride 0.9 % 100 mL IVPB        2 g 200 mL/hr over 30 Minutes Intravenous Every 24 hours 10/11/22 1148 10/15/22 1327   10/12/22 1000  metroNIDAZOLE (FLAGYL) IVPB 500 mg        500 mg 100 mL/hr over 60 Minutes Intravenous Every 12 hours 10/11/22 1148 10/15/22 2255   10/11/22 1145  levofloxacin (LEVAQUIN) IVPB 500 mg  Status:  Discontinued        500 mg 100 mL/hr over 60 Minutes Intravenous Every 24 hours 10/11/22 1130 10/11/22 1146   10/11/22 0900  levofloxacin (LEVAQUIN) IVPB 750 mg        750 mg 100 mL/hr over 90 Minutes Intravenous  Once 10/11/22 0858 10/11/22 1127          Medications  (feeding supplement) PROSource Plus  30 mL Oral TID BM   acetaminophen  650 mg Oral TID   enoxaparin (LOVENOX) injection  40 mg Subcutaneous Q24H   ketotifen  1 drop Both Eyes BID   magic mouthwash  5 mL Oral QID   metoprolol succinate  12.5 mg Oral Daily   multivitamin with minerals  1 tablet Oral Daily   pantoprazole (PROTONIX) IV  40 mg Intravenous Daily   polyethylene glycol  17 g Oral BID      Subjective:   Barbara Nelson was seen and examined today.  Somewhat drowsy but opens eyes, does not interact or follow commands.  Received chest PT prior to my encounter.  Overnight no acute issues except that daughter at the bedside reported suctioning.  Patient's daughter reports that he  continues to have " sore throat and not able to eat".  No abdominal pain.  No reported nausea or vomiting or any fevers.  Objective:   Vitals:   10/20/22 1345 10/20/22 1625 10/20/22 2124 10/21/22 0700  BP: 134/71  128/78 126/72  Pulse: 83 91 91 87  Resp: 14 16 20 16   Temp: 97.7 F (36.5 C)  98.6 F (37 C) 98.3 F (36.8 C)  TempSrc: Oral   Axillary  SpO2: 99% 95% 96% 97%  Weight:      Height:        Intake/Output Summary (Last 24 hours) at 10/21/2022 1202 Last data filed at 10/21/2022  1610 Gross per 24 hour  Intake 260.04 ml  Output --  Net 260.04 ml     Wt Readings from Last 3 Encounters:  10/11/22 90.7 kg  10/01/22 90.7 kg  08/19/22 92.1 kg     Exam General: Lethargic, opens eyes but  minimally interactive Cardiovascular: S1 S2 auscultated,  RRR Respiratory: Expiratory wheezing bilaterally with bibasilar crackles Gastrointestinal: Soft, nontender, nondistended, + bowel sounds Ext: trace pedal edema bilaterally Neuro: not following commands Psych: drowsy, minimal interactive    Data Reviewed:  I have personally reviewed following labs    CBC Lab Results  Component Value Date   WBC 6.6 10/21/2022   RBC 3.83 (L) 10/21/2022   HGB 10.9 (L) 10/21/2022   HCT 32.5 (L) 10/21/2022   MCV 84.9 10/21/2022   MCH 28.5 10/21/2022   PLT 123 (L) 10/21/2022   MCHC 33.5 10/21/2022   RDW 17.7 (H) 10/21/2022   LYMPHSABS 1.1 10/11/2022   MONOABS 0.5 10/11/2022   EOSABS 0.1 10/11/2022   BASOSABS 0.0 10/11/2022     Last metabolic panel Lab Results  Component Value Date   NA 140 10/21/2022   K 3.0 (L) 10/21/2022   CL 106 10/21/2022   CO2 27 10/21/2022   BUN 6 (L) 10/21/2022   CREATININE 0.57 10/21/2022   GLUCOSE 82 10/21/2022   GFRNONAA >60 10/21/2022   GFRAA 57 (L) 03/03/2012   CALCIUM 8.4 (L) 10/21/2022   PHOS 2.5 07/10/2022   PROT 5.2 (L) 10/21/2022   ALBUMIN 1.5 (L) 10/21/2022   BILITOT 0.4 10/21/2022   ALKPHOS 168 (H) 10/21/2022   AST 36 10/21/2022   ALT  20 10/21/2022   ANIONGAP 7 10/21/2022    CBG (last 3)  No results for input(s): "GLUCAP" in the last 72 hours.    Coagulation Profile: No results for input(s): "INR", "PROTIME" in the last 168 hours.   Radiology Studies: I have personally reviewed the imaging studies  DG CHEST PORT 1 VIEW  Result Date: 10/20/2022 CLINICAL DATA:  Acute respiratory failure with hypoxia EXAM: PORTABLE CHEST 1 VIEW COMPARISON:  10/11/2022 FINDINGS: Blunting of both costophrenic angles with substantially increased peripheral density in the right mid chest likely from fluid in the minor fissure and possibly tracking along the pleural margin and major fissure. Mild retrocardiac airspace opacity increased from prior. Thoracic spondylosis. Mild enlargement of the cardiopericardial silhouette. Indistinct pulmonary vasculature with potential cephalization of blood flow favoring pulmonary venous hypertension. IMPRESSION: 1. Mild enlargement of the cardiopericardial silhouette with indistinct pulmonary vasculature and potential cephalization of blood flow favoring pulmonary venous hypertension. 2. Bilateral pleural effusions, increased on the right 3. Increased peripheral density in the right mid chest likely from fluid in the minor fissure and possibly tracking along the major fissure. 4. Increased retrocardiac airspace opacity potentially from atelectasis or pneumonia. Electronically Signed   By: Gaylyn Rong M.D.   On: 10/20/2022 14:04   CT HEAD WO CONTRAST ( )  Result Date: 10/20/2022 CLINICAL DATA:  Neuro deficit, acute, stroke suspected. EXAM: CT HEAD WITHOUT CONTRAST TECHNIQUE: Contiguous axial images were obtained from the base of the skull through the vertex without intravenous contrast. RADIATION DOSE REDUCTION: This exam was performed according to the departmental dose-optimization program which includes automated exposure control, adjustment of the mA and/or kV according to patient size and/or use of  iterative reconstruction technique. COMPARISON:  Head CT 10/11/2022. FINDINGS: Brain: No acute intracranial hemorrhage. Unchanged mild chronic small-vessel disease. Gray-white differentiation is otherwise preserved. No hydrocephalus or extra-axial collection. No mass  effect or midline shift. Vascular: No hyperdense vessel or unexpected calcification. Skull: No calvarial fracture or suspicious bone lesion. Skull base is unremarkable. Sinuses/Orbits: Unremarkable. Other: None. IMPRESSION: 1. No acute intracranial abnormality. 2. Unchanged mild chronic small-vessel disease. Electronically Signed   By: Orvan Falconer M.D.   On: 10/20/2022 11:34       Makaya Juneau M.D. [facility] 10/21/2022, 12:02 PM  Available via Epic secure chat 7am-7pm After 7 pm, please refer to night coverage provider listed on amion.

## 2022-10-22 DIAGNOSIS — J189 Pneumonia, unspecified organism: Secondary | ICD-10-CM | POA: Diagnosis not present

## 2022-10-22 DIAGNOSIS — R0600 Dyspnea, unspecified: Secondary | ICD-10-CM

## 2022-10-22 DIAGNOSIS — R410 Disorientation, unspecified: Secondary | ICD-10-CM | POA: Diagnosis not present

## 2022-10-22 DIAGNOSIS — R29898 Other symptoms and signs involving the musculoskeletal system: Secondary | ICD-10-CM | POA: Diagnosis not present

## 2022-10-22 DIAGNOSIS — R34 Anuria and oliguria: Secondary | ICD-10-CM | POA: Diagnosis not present

## 2022-10-22 LAB — CBC
HCT: 32.4 % — ABNORMAL LOW (ref 36.0–46.0)
Hemoglobin: 10.8 g/dL — ABNORMAL LOW (ref 12.0–15.0)
MCH: 28.8 pg (ref 26.0–34.0)
MCHC: 33.3 g/dL (ref 30.0–36.0)
MCV: 86.4 fL (ref 80.0–100.0)
Platelets: 126 10*3/uL — ABNORMAL LOW (ref 150–400)
RBC: 3.75 MIL/uL — ABNORMAL LOW (ref 3.87–5.11)
RDW: 17.8 % — ABNORMAL HIGH (ref 11.5–15.5)
WBC: 8 10*3/uL (ref 4.0–10.5)
nRBC: 0 % (ref 0.0–0.2)

## 2022-10-22 LAB — BASIC METABOLIC PANEL
Anion gap: 7 (ref 5–15)
BUN: 6 mg/dL — ABNORMAL LOW (ref 8–23)
CO2: 27 mmol/L (ref 22–32)
Calcium: 8.1 mg/dL — ABNORMAL LOW (ref 8.9–10.3)
Chloride: 101 mmol/L (ref 98–111)
Creatinine, Ser: 0.58 mg/dL (ref 0.44–1.00)
GFR, Estimated: >60 mL/min (ref >60)
Glucose, Bld: 67 mg/dL — ABNORMAL LOW (ref 70–99)
Potassium: 3.4 mmol/L — ABNORMAL LOW (ref 3.5–5.1)
Sodium: 135 mmol/L (ref 135–145)

## 2022-10-22 MED ORDER — FUROSEMIDE 10 MG/ML IJ SOLN
20.0000 mg | Freq: Two times a day (BID) | INTRAMUSCULAR | Status: DC
  Administered 2022-10-22 – 2022-10-27 (×10): 20 mg via INTRAVENOUS
  Filled 2022-10-22 (×10): qty 2

## 2022-10-22 MED ORDER — ACETAMINOPHEN 10 MG/ML IV SOLN
650.0000 mg | Freq: Four times a day (QID) | INTRAVENOUS | Status: AC | PRN
  Administered 2022-10-22 – 2022-10-23 (×2): 650 mg via INTRAVENOUS
  Filled 2022-10-22 (×2): qty 65

## 2022-10-22 MED ORDER — FUROSEMIDE 10 MG/ML IJ SOLN
20.0000 mg | Freq: Every day | INTRAMUSCULAR | Status: DC
  Administered 2022-10-22: 20 mg via INTRAVENOUS
  Filled 2022-10-22: qty 2

## 2022-10-22 MED ORDER — SUCRALFATE 1 G PO TABS
1.0000 g | ORAL_TABLET | Freq: Three times a day (TID) | ORAL | Status: AC
  Administered 2022-10-22 – 2022-10-23 (×4): 1 g via ORAL
  Filled 2022-10-22 (×5): qty 1

## 2022-10-22 MED ORDER — POTASSIUM CHLORIDE 10 MEQ/100ML IV SOLN
10.0000 meq | INTRAVENOUS | Status: AC
  Administered 2022-10-22 – 2022-10-23 (×3): 10 meq via INTRAVENOUS
  Filled 2022-10-22 (×3): qty 100

## 2022-10-22 NOTE — Plan of Care (Signed)
  Problem: Clinical Measurements: Goal: Will remain free from infection Outcome: Progressing Goal: Respiratory complications will improve Outcome: Progressing Goal: Cardiovascular complication will be avoided Outcome: Progressing   

## 2022-10-22 NOTE — Progress Notes (Signed)
[facility] Progress Note  SUBJECTIVE:   Interval history: Barbara Nelson was seen and evaluated today at bedside. Multiple family members at bedside today. After conversation over telephone yesterday, plan was to optimize medications for odynophagia before pursuing more aggressive measures such as EGD. Appreciate SLP input as well. Per family, patient was unable to tolerate oral formulation of sucralfate, it has been changed to pill formulation. There is ongoing discussion with palliative care. Patient is minimally interactive on exam today. She denied abdominal pain. Was intermittently able to say whether she had odynophagia.   Past Medical History:  Diagnosis Date   Benign essential HTN 10/31/2015   Cardiac tamponade due to viral pericarditis    LBBB (left bundle branch block)    Lung mass    fungal infection s/p resection at [facility]   Postoperative atrial fibrillation [facility])    Pulmonary nodule    Past Surgical History:  Procedure Laterality Date   ABDOMINAL HYSTERECTOMY  1988   chest tube placement  09/03/11   LUNG REMOVAL, PARTIAL     right knee arthoscopy with meniscal repair     subxiphoid pericardial window and drainage anterior partial pericardiectomy  09/03/11   Current Facility-Administered Medications  Medication Dose Route Frequency Provider Last Rate Last Admin   (feeding supplement) PROSource Plus liquid 30 mL  30 mL Oral TID BM Barbara Nelson B, MD   30 mL at 10/21/22 1013   acetaminophen (TYLENOL) suppository 650 mg  650 mg Rectal Q6H PRN Barbara Brooking, MD   650 mg at 10/19/22 1322   acetaminophen (TYLENOL) tablet 650 mg  650 mg Oral TID Barbara Avena, NP   650 mg at 10/21/22 2300   albuterol (PROVENTIL) (2.5 MG/3ML) 0.083% nebulizer solution 2.5 mg  2.5 mg Nebulization Q2H PRN Barbara Nelson, Mir M, MD   2.5 mg at 10/18/22 1735   enoxaparin (LOVENOX) injection 40 mg  40 mg Subcutaneous Q24H Barbara Nelson, Mir M, MD   40 mg at 10/21/22 2300   fluconazole  (DIFLUCAN) IVPB 200 mg  200 mg Intravenous Daily Barbara Nelson, RPH 100 mL/hr at 10/21/22 1634 200 mg at 10/21/22 1634   food thickener (SIMPLYTHICK (NECTAR/LEVEL 2/MILDLY THICK)) 1 packet  1 packet Oral PRN Barbara Fallen, MD       furosemide (LASIX) injection 20 mg  20 mg Intravenous Daily Rai, Ripudeep K, MD   20 mg at 10/22/22 0959   guaiFENesin-dextromethorphan (ROBITUSSIN DM) 100-10 MG/5ML syrup 5 mL  5 mL Oral Q4H PRN Barbara Brooking, MD       ketotifen (ZADITOR) 0.035 % ophthalmic solution 1 drop  1 drop Both Eyes BID Barbara Nelson, Mir M, MD   1 drop at 10/21/22 2300   magic mouthwash  5 mL Oral QID Barbara Brooking, MD   5 mL at 10/20/22 2219   metoprolol succinate (TOPROL-XL) 24 hr tablet 12.5 mg  12.5 mg Oral Daily Barbara Nelson, Mir M, MD   12.5 mg at 10/21/22 2300   multivitamin with minerals tablet 1 tablet  1 tablet Oral Daily Barbara Nelson B, MD   1 tablet at 10/21/22 1012   ondansetron (ZOFRAN) injection 4 mg  4 mg Intravenous Q6H PRN Barbara Nelson, Mir M, MD       pantoprazole (PROTONIX) injection 40 mg  40 mg Intravenous Daily Barbara Nelson B, MD   40 mg at 10/22/22 1006   polyethylene glycol (MIRALAX / GLYCOLAX) packet 17 g  17 g Oral BID Barbara Fallen, MD  17 g at 10/20/22 2218   sucralfate (CARAFATE) tablet 1 g  1 g Oral TID WC & HS Rai, Ripudeep K, MD       Allergies as of 10/11/2022 - Review Complete 10/11/2022  Allergen Reaction Noted   Latex Rash 08/04/2012   Penicillins Rash and Itching 08/03/2013   Tape Hives and Rash 10/06/2011   Z-pak [azithromycin] Rash and Other (See Comments) 06/28/2014   Egg-derived products Nausea And Vomiting 12/15/2021   Potassium Nausea And Vomiting 08/19/2022   Amoxil [amoxicillin] Rash 06/27/2013   Sulfa antibiotics Rash 10/03/2018   Review of Systems:  Review of Systems  Reason unable to perform ROS: somnolent.    OBJECTIVE:   Temp:  [98.3 F (36.8 C)-98.5 F (36.9 C)] 98.5 F (36.9 C) (06/06 0640) Pulse Rate:   [90-94] 90 (06/06 0640) Resp:  [18] 18 (06/06 0640) BP: (129-152)/(68-93) 129/68 (06/06 0640) SpO2:  [90 %-100 %] 100 % (06/06 0640) Last BM Date : 10/20/22 Physical Exam Constitutional:      General: She is not in acute distress. Cardiovascular:     Rate and Rhythm: Normal rate and regular rhythm.  Pulmonary:     Effort: No respiratory distress.     Breath sounds: Normal breath sounds.     Comments: Supplemental oxygen nasal cannula 2 liters. Abdominal:     General: Bowel sounds are normal. There is no distension.     Palpations: Abdomen is soft.     Tenderness: There is no abdominal tenderness. There is no guarding.  Musculoskeletal:     Right lower leg: No edema.     Left lower leg: No edema.  Neurological:     Mental Status: She is lethargic.     Labs: Recent Labs    10/21/22 0333 10/22/22 0626  WBC 6.6 8.0  HGB 10.9* 10.8*  HCT 32.5* 32.4*  PLT 123* 126*   BMET Recent Labs    10/20/22 0351 10/21/22 0333 10/22/22 0626  NA 138 140 135  Nelson 3.2* 3.0* 3.4*  CL 108 106 101  CO2 25 27 27   GLUCOSE 74 82 67*  BUN 5* 6* 6*  CREATININE 0.55 0.57 0.58  CALCIUM 8.2* 8.4* 8.1*   LFT Recent Labs    10/21/22 0333  PROT 5.2*  ALBUMIN 1.5*  AST 36  ALT 20  ALKPHOS 168*  BILITOT 0.4   PT/INR No results for input(s): "LABPROT", "INR" in the last 72 hours. Diagnostic imaging: No results found.  IMPRESSION: Dysphagia Odynophagia Impaired oropharyngeal swallow per SLP evaluation, required dietary modification Protein calorie malnutrition History pulmonary blastomycosis s/p R lobectomy Chronic respiratory failure on supplemental oxygen Congestive heart failure Hypertension Diabetes mellitus  PLAN: -Continue with medication therapies for odynophagia (empirically treating esophageal candidiasis, ulcerative esophagitis and gastroesophageal reflux disease) -I communicated with family that I am uncertain that an etiology of Barbara Nelson's odynophagia would be found  with EGD and this procedure imparts risks of bleeding/infection/perforation/anesthesia, they verbalized understanding -Follow along palliative care evaluation -Diet as tolerated, monitor oral intake -[facility] will follow   LOS: 9 days   Liliane Shi, [facility]

## 2022-10-22 NOTE — Progress Notes (Signed)
[facility]                                                                              Barbara Nelson, is a 82 y.o. female, DOB - [birth year], NGE:[id number] Admit date - 10/11/2022    Outpatient Primary MD for the patient is [name], MD  LOS - 9  days  Chief Complaint  Patient presents with   Urinary Tract Infection       Brief summary   Patient is a 82 year old female, lives with daughter, PMH recurrent UTI since February with gradual physical decline, who presented with generalized weakness, poor oral intake, confusion, difficulty swallowing with choking and coughing. Admitted for aspiration pneumonia. Possible UTI. History of multiple episodes of pneumonia and UTI since February.    Patient admitted as above with recurrent episode of pneumonia with moderate hypoxia and respiratory distress as well as concurrent questionable UTI.  Patient completed antibiotic course but continues to have respiratory symptoms and hypoxia.  Speech evaluation improved that patient does have moderately profound dysphagia requiring nectar thick liquids which has limited her p.o. intake in both the caloric and free water aspects.  Given patient's poor nutritional status, ongoing aspiration, and concern for failure to thrive palliative care was consulted.   Assessment & Plan    Principal Problem:   Aspiration pneumonia (HCC), sepsis POA Acute respiratory failure with hypoxia, POA -Underlying history of chronic pulmonary blastomycosis, s/p lobectomy in 2014, chronic respiratory failure on 2 L O2 via [place], -Completed empiric antibiotics, wean O2 as tolerated.  Sepsis physiology resolved -SLP evaluation recommended dysphagia 1 diet with nectar thick liquids -Daughter stating abrupt change in her ability to tolerate PO's  "she was eating fine before admission", CT head negative for acute CVA or acute pathology. -Chest x-ray 6/4 showed pulmonary edema, bilateral pleural effusions,  increased on the right, increased retrocardiac airspace opacity atelectasis or pneumonia -BNP 162.5, patient was placed on IV Lasix for diuresis, discussed with pulmonology, appreciate recommendations, will increase IV Lasix to 20 mg twice daily and obtain CT chest in 1 to 2 days  Active Problems: Odynophagia, oral candidiasis, ?  Pharyngeal and esophageal candidiasis -Currently on Diflucan, started on 5/28, Magic mouthwash -On dysphagia 1 pured diet with nectar thick liquids -Patient continues to have issues with odynophagia despite Diflucan and Magic wash, PPI. -Patient was not able to tolerate sucralfate suspension, changed to p.o. tabs as patient appears to be taking pills with applesauce -GI following, appreciate recommendations -Palliative medicine following  Acute metabolic encephalopathy, failure to thrive, generalized debility -Per previous documentation, patient has been struggling with multiple episodes of PNA, UTI since February.  Has had multiple rounds of antibiotics with minimal improvement.  -Intermittent improvement, somnolent most of the time  Hyponatremia -Resolved  Hypokalemia -Replaced IV    Malnutrition of moderate degree, failure to thrive Etiology: chronic illness Signs/Symptoms: energy intake < 75% for > or equal to 1 month, percent weight loss (9.5% in 5 months) Percent weight loss: 9.5 % (in 5 months) Interventions: Refer to RD note for recommendations, Magic cup, Prostat, MVI Estimated body mass index is 32.27 kg/m as calculated from the  following:   Height as of this encounter: 5\' 6"  (1.676 m).   Weight as of this encounter: 90.7 kg.  Code Status: DNI DVT Prophylaxis:  enoxaparin (LOVENOX) injection 40 mg Start: 10/11/22 2200 SCDs Start: 10/11/22 1132   Level of Care: Level of care: Med-Surg Family Communication: Updated patient's daughter and sister at the bedside and brother, daughter on the speaker phone Disposition Plan:      Remains inpatient  appropriate:      Procedures:    Consultants:   Palliative medicine GI Pulmonology Antimicrobials:   Anti-infectives (From admission, onward)    Start     Dose/Rate Route Frequency Ordered Stop   10/13/22 1400  fluconazole (DIFLUCAN) IVPB 200 mg       See Hyperspace for full Linked Orders Report.   200 mg 100 mL/hr over 60 Minutes Intravenous Daily 10/12/22 1138     10/13/22 1000  fluconazole (DIFLUCAN) IVPB 100 mg  Status:  Discontinued       See Hyperspace for full Linked Orders Report.   100 mg 50 mL/hr over 60 Minutes Intravenous Daily 10/12/22 1036 10/12/22 1138   10/12/22 1130  fluconazole (DIFLUCAN) IVPB 200 mg       See Hyperspace for full Linked Orders Report.   200 mg 100 mL/hr over 60 Minutes Intravenous  Once 10/12/22 1036 10/13/22 1114   10/12/22 1000  cefTRIAXone (ROCEPHIN) 2 g in sodium chloride 0.9 % 100 mL IVPB        2 g 200 mL/hr over 30 Minutes Intravenous Every 24 hours 10/11/22 1148 10/15/22 1327   10/12/22 1000  metroNIDAZOLE (FLAGYL) IVPB 500 mg        500 mg 100 mL/hr over 60 Minutes Intravenous Every 12 hours 10/11/22 1148 10/15/22 2255   10/11/22 1145  levofloxacin (LEVAQUIN) IVPB 500 mg  Status:  Discontinued        500 mg 100 mL/hr over 60 Minutes Intravenous Every 24 hours 10/11/22 1130 10/11/22 1146   10/11/22 0900  levofloxacin (LEVAQUIN) IVPB 750 mg        750 mg 100 mL/hr over 90 Minutes Intravenous  Once 10/11/22 0858 10/11/22 1127          Medications  (feeding supplement) PROSource Plus  30 mL Oral TID BM   acetaminophen  650 mg Oral TID   enoxaparin (LOVENOX) injection  40 mg Subcutaneous Q24H   furosemide  20 mg Intravenous BID   ketotifen  1 drop Both Eyes BID   magic mouthwash  5 mL Oral QID   metoprolol succinate  12.5 mg Oral Daily   multivitamin with minerals  1 tablet Oral Daily   pantoprazole (PROTONIX) IV  40 mg Intravenous Daily   polyethylene glycol  17 g Oral BID   sucralfate  1 g Oral TID WC & HS       Subjective:   Barbara Nelson was seen and examined today.  Minimally interactive, did open eyes during encounter however did not follow commands.  Fevers or chills, overnight no acute issues.  Patient was not able to tolerate sucralfate suspension overnight.  Daughter and sister at the bedside. Objective:   Vitals:   10/20/22 2124 10/21/22 0700 10/21/22 2143 10/22/22 0640  BP: 128/78 126/72 (!) 152/93 129/68  Pulse: 91 87 94 90  Resp: 20 16 18 18   Temp: 98.6 F (37 C) 98.3 F (36.8 C) 98.3 F (36.8 C) 98.5 F (36.9 C)  TempSrc:  Axillary Oral   SpO2: 96% 97% 90%  100%  Weight:      Height:        Intake/Output Summary (Last 24 hours) at 10/22/2022 1415 Last data filed at 10/22/2022 0200 Gross per 24 hour  Intake 150 ml  Output 400 ml  Net -250 ml     Wt Readings from Last 3 Encounters:  10/11/22 90.7 kg  10/01/22 90.7 kg  08/19/22 92.1 kg   Physical Exam General: Minimally interactive, opens eyes however does not follow commands, ill-appearing HEENT: periorbital edema noted Cardiovascular: S1 S2 clear, RRR.  Respiratory: Diminished breath sounds at the bases otherwise clear to clear today Gastrointestinal: Soft, nontender, nondistended, NBS Ext: trace pedal edema bilaterally Neuro: withdraws to pain stimuli otherwise did not follow commands Psych: somnolent    Data Reviewed:  I have personally reviewed following labs    CBC Lab Results  Component Value Date   WBC 8.0 10/22/2022   RBC 3.75 (L) 10/22/2022   HGB 10.8 (L) 10/22/2022   HCT 32.4 (L) 10/22/2022   MCV 86.4 10/22/2022   MCH 28.8 10/22/2022   PLT 126 (L) 10/22/2022   MCHC 33.3 10/22/2022   RDW 17.8 (H) 10/22/2022   LYMPHSABS 1.1 10/11/2022   MONOABS 0.5 10/11/2022   EOSABS 0.1 10/11/2022   BASOSABS 0.0 10/11/2022     Last metabolic panel Lab Results  Component Value Date   NA 135 10/22/2022   K 3.4 (L) 10/22/2022   CL 101 10/22/2022   CO2 27 10/22/2022   BUN 6 (L) 10/22/2022    CREATININE 0.58 10/22/2022   GLUCOSE 67 (L) 10/22/2022   GFRNONAA >60 10/22/2022   GFRAA 57 (L) 03/03/2012   CALCIUM 8.1 (L) 10/22/2022   PHOS 2.5 07/10/2022   PROT 5.2 (L) 10/21/2022   ALBUMIN 1.5 (L) 10/21/2022   BILITOT 0.4 10/21/2022   ALKPHOS 168 (H) 10/21/2022   AST 36 10/21/2022   ALT 20 10/21/2022   ANIONGAP 7 10/22/2022    CBG (last 3)  No results for input(s): "GLUCAP" in the last 72 hours.    Coagulation Profile: No results for input(s): "INR", "PROTIME" in the last 168 hours.   Radiology Studies: I have personally reviewed the imaging studies  No results found.     Thad Ranger M.D. [facility] 10/22/2022, 2:15 PM  Available via Epic secure chat 7am-7pm After 7 pm, please refer to night coverage provider listed on amion.

## 2022-10-22 NOTE — Progress Notes (Signed)
Physical Therapy Treatment Patient Details Name: Barbara Nelson MRN: [id number] DOB: Oct 29, 1940 Today's Date: 10/22/2022   History of Present Illness Patient is a 82 year old female who presented with decreased urinary output, difficulty with mobility and swallowing. Patient was admitted with aspiration pneumonia, hyponatremia, PMH: HTN, recent UTI.    PT Comments    Pt received in bed, family at bedside. Pt on 2L O2 with SpO2 at 97% at rest. Total A of 2 to transfer from supine to sitting EOB. Pt assisted with feet on floor and B UE supported fluctuating between Min/ModA and SBA for sitting balance x 16 minutes. Eyes remained closed 90% of session, only occasionally opening to vc's responding with single word answers x 3. SpO2 did decrease to 91% due to poor head positioning forward with Max tactile cues to correct and maintain.  Pt repositioned back in bed and turned to Left side. Will continue to progress acutely as tolerated.   Recommendations for follow up therapy are one component of a multi-disciplinary discharge planning process, led by the attending physician.  Recommendations may be updated based on patient status, additional functional criteria and insurance authorization.  Follow Up Recommendations  Can patient physically be transported by private vehicle: No    Assistance Recommended at Discharge Frequent or constant Supervision/Assistance  Patient can return home with the following Two people to help with walking and/or transfers;Two people to help with bathing/dressing/bathroom;Direct supervision/assist for medications management;Help with stairs or ramp for entrance;Assistance with cooking/housework;Assist for transportation;Assistance with feeding;Direct supervision/assist for financial management   Equipment Recommendations  None recommended by PT    Recommendations for Other Services       Precautions / Restrictions Precautions Precautions:  Fall Restrictions Weight Bearing Restrictions: No     Mobility  Bed Mobility Overal bed mobility: Needs Assistance Bed Mobility: Supine to Sit, Sit to Supine Rolling: Max assist   Supine to sit: HOB elevated, +2 for physical assistance, +2 for safety/equipment, Total assist Sit to supine: Total assist, +2 for physical assistance, +2 for safety/equipment   General bed mobility comments:  (Pt did not assist with bed mobility)    Transfers                        Ambulation/Gait                   Stairs             Wheelchair Mobility    Modified Rankin (Stroke Patients Only)       Balance Overall balance assessment: Needs assistance Sitting-balance support: Feet supported, Bilateral upper extremity supported Sitting balance-Leahy Scale: Fair Sitting balance - Comments:  (Pt sat EOB x 16 minutes with SBA-MinA once posture adjusted and feet supported)                                    Cognition Arousal/Alertness: Lethargic Behavior During Therapy: Flat affect Overall Cognitive Status: Difficult to assess Area of Impairment: Attention, Following commands (eyes closed 90% of session)                       Following Commands: Follows one step commands inconsistently     Problem Solving: Slow processing, Requires verbal cues, Requires tactile cues, Difficulty sequencing, Decreased initiation General Comments:  (Family in room, pt responded with single word answers x 3)  Exercises      General Comments General comments (skin integrity, edema, etc.):  (Left eye area remains red, Pt only opened eyes x 3 during session, slight smile, but didn't engage)      Pertinent Vitals/Pain Pain Assessment Pain Assessment: No/denies pain    Home Living                          Prior Function            PT Goals (current goals can now be found in the care plan section)      Frequency    Min  1X/week      PT Plan Current plan remains appropriate    Co-evaluation              AM-PAC PT "6 Clicks" Mobility   Outcome Measure  Help needed turning from your back to your side while in a flat bed without using bedrails?: Total Help needed moving from lying on your back to sitting on the side of a flat bed without using bedrails?: Total Help needed moving to and from a bed to a chair (including a wheelchair)?: Total Help needed standing up from a chair using your arms (e.g., wheelchair or bedside chair)?: Total Help needed to walk in hospital room?: Total Help needed climbing 3-5 steps with a railing? : Total 6 Click Score: 6    End of Session Equipment Utilized During Treatment: Oxygen (2L O2) Activity Tolerance: Patient limited by lethargy Patient left: in bed;with call bell/phone within reach;with bed alarm set;with family/visitor present Nurse Communication: Mobility status PT Visit Diagnosis: Other abnormalities of gait and mobility (R26.89);Muscle weakness (generalized) (M62.81)     Time: 1610-9604 PT Time Calculation (min) (ACUTE ONLY): 24 min  Charges:  $Therapeutic Activity: 23-37 mins                    Zadie Cleverly, PTA  Jannet Askew 10/22/2022, 4:34 PM

## 2022-10-22 NOTE — Progress Notes (Signed)
  Daily Progress Note   Patient Name: Barbara Nelson       Date: 10/22/2022 DOB: [birth year]  Age: 82 y.o. MRN#: [id number] Attending Physician: Cathren Harsh, MD Primary Care Physician: [name], MD Admit Date: 10/11/2022 Length of Stay: 9 days  Palliative medicine team continues to follow along with patient's medical journey. Patient's sucralfate transitioned to pill formulation today as patient unable to tolerate liquid. GI provider discussed care with patient's family today concern related to performing EGD and continuing with medication therapies for odynophagia at this time. PCCM provider now consulted regarding respiratory status.   This provider had met with patient's family on 6/5. At that time, they were clear that they wanted to pursue medical interventions to "fix the fixable". If there is concern that appropraite medical interventions are no longer providing benefit to patient and further workup would produce more risk than benefit, would recommend family meeting be scheduled with all care team providers present (hospitalist, GI, PCCM, PMT) to discuss with the multiple family concerned about patient's care. Palliative medicine team will continue to follow along with patient's medical journey and engage in conversations as able with patient and family. Thank you.    Alvester Morin, DO Palliative Care Provider PMT # [phone]

## 2022-10-22 NOTE — Consult Note (Signed)
NAME:  Barbara Nelson, MRN:  [id number], DOB:  1941/02/13, LOS: 9 ADMISSION DATE:  10/11/2022, CONSULTATION DATE:  10/22/22 REFERRING MD:  Isidoro Donning, CHIEF COMPLAINT:  weakness, dyspnea   History of Present Illness:  82yF who lives with daughter, recurrent UTI, chronic pulmonary blastomycosis (a single pet avid nodule) sp lobectomy at [facility] and course of itraconazole in 2014, chronic respiratory failure on 2L O2, chronic diastolic heart failure who was in her USOH until 2 weeks PTA (which was 5/26) developed weakness and malaise. Concern for aspiration pneumonia. She completed 5d course of aspiration pneumonia coverage with ceftriaxone and flagyl. She has also been treated here for OP candidiasis with fluconazole. She has been diuresed - she is net positive 14L here but this is probably inaccurate with mostly uncharted voids. Her course has also been complicated by hypoactive delirium. Speech, palliative care has been following. GI consulted for odynophagia.  Pertinent  Medical History  Recurrent UTI Chronic pulmonary blastomycosis sp lobectomy and course of itraconazole 2014 Chronic hypoxic respiratory failure on 2L O2 Chronic diastolic heart failure (TTE with G1DD 09/2022)  Significant Hospital Events: Including procedures, antibiotic start and stop dates in addition to other pertinent events   5/26 admitted and started on CAP coverage MIVF stopped 6/2 6/3-6/7 diuresing with iv lasix 20 mg daily. 6/4 CXR with new multifocal alveolar opacities  Interim History / Subjective:  She doesn't open her eyes during my exam, answer questions.   Objective   Blood pressure 129/68, pulse 90, temperature 98.5 F (36.9 C), resp. rate 18, height 5\' 6"  (1.676 m), weight 90.7 kg, SpO2 100 %.        Intake/Output Summary (Last 24 hours) at 10/22/2022 0955 Last data filed at 10/22/2022 0200 Gross per 24 hour  Intake 200 ml  Output 400 ml  Net -200 ml   Filed Weights   10/11/22 0524  Weight: 90.7 kg     Examination: General appearance: 82 y.o., female, chronically ill appearing Eyes: eyes closed with periorbital edema and erythema HENT: NCAT; MMM Neck: Trachea midline; no lymphadenopathy, no JVD Lungs: CTAB, no crackles, no wheeze, with normal respiratory effort CV: RRR, no murmur  Abdomen: Soft, non-tender; non-distended, BS present  Extremities: trace peripheral edema, warm Skin: Normal turgor and texture; no rash Neuro: withdraws to noxious stim ble    Resolved Hospital Problem list    Assessment & Plan:   # Reported cough, dyspnea # Multifocal alveolar opacities on CXR # acute on chronic diastolic heart failure # At risk for aspiration  # severe protein calorie malnutrition # history of asymptomatic RML nodule from blastomycosis s/p VATS RMLectomy and 6 month course of itraconazole in 2014 # history of cardiac tamponade sp pericardial window 2013 with unrevealing workup for etiology of PCE - [facility] later suspected this was manifestation of her blastomycosis  She does appear to have acute multifocal alveolar opacities on CXR 6/4 relative to admission CXR - this is probably either pulmonary edema with bilateral pleural effusions and/or aspiration pneumonitis. Regardless she seems to be doing well from respiratory standpoint today. Her family however worries about the possibility of either recurrent or new indolent infx process like her previous issue with blastomycosis. Apparent severity of her protein calorie malnutrition seems to indicate she has not been doing well chronically.   - push diuresis today and tomorrow with at least lasix 20 IV bid or lasix IV 40 mg daily - obtain CT Chest wo contrast either tomorrow evening or Saturday morning  Will follow   Best Practice (right click and "Reselect all SmartList Selections" daily)   Per primary  Labs   CBC: Recent Labs  Lab 10/17/22 0329 10/19/22 0335 10/21/22 0333 10/22/22 0626  WBC 6.5 6.6 6.6 8.0  HGB 11.2*  11.1* 10.9* 10.8*  HCT 32.4* 32.6* 32.5* 32.4*  MCV 84.4 84.7 84.9 86.4  PLT 112* 130* 123* 126*    Basic Metabolic Panel: Recent Labs  Lab 10/17/22 0329 10/19/22 0335 10/20/22 0351 10/21/22 0333 10/22/22 0626  NA 137 136 138 140 135  K 3.4* 3.3* 3.2* 3.0* 3.4*  CL 108 107 108 106 101  CO2 24 26 25 27 27   GLUCOSE 83 82 74 82 67*  BUN 5* 5* 5* 6* 6*  CREATININE 0.57 0.56 0.55 0.57 0.58  CALCIUM 8.4* 8.2* 8.2* 8.4* 8.1*  MG  --   --  1.6* 1.9  --    GFR: Estimated Creatinine Clearance: 61.5 mL/min (by C-G formula based on SCr of 0.58 mg/dL). Recent Labs  Lab 10/17/22 0329 10/19/22 0335 10/21/22 0333 10/22/22 0626  WBC 6.5 6.6 6.6 8.0    Liver Function Tests: Recent Labs  Lab 10/21/22 0333  AST 36  ALT 20  ALKPHOS 168*  BILITOT 0.4  PROT 5.2*  ALBUMIN 1.5*   No results for input(s): "LIPASE", "AMYLASE" in the last 168 hours. No results for input(s): "AMMONIA" in the last 168 hours.  ABG    Component Value Date/Time   PHART 7.51 (H) 10/13/2022 1340   PCO2ART 31 (L) 10/13/2022 1340   PO2ART 64 (L) 10/13/2022 1340   HCO3 24.7 10/13/2022 1340   TCO2 34 (H) 07/01/2022 0830   O2SAT 95.3 10/13/2022 1340     Coagulation Profile: No results for input(s): "INR", "PROTIME" in the last 168 hours.  Cardiac Enzymes: No results for input(s): "CKTOTAL", "CKMB", "CKMBINDEX", "TROPONINI" in the last 168 hours.  HbA1C: Hgb A1c MFr Bld  Date/Time Value Ref Range Status  06/19/2022 06:57 AM 6.9 (H) 4.8 - 5.6 % Final    Comment:    (NOTE) Pre diabetes:          5.7%-6.4%  Diabetes:              >6.4%  Glycemic control for   <7.0% adults with diabetes     CBG: No results for input(s): "GLUCAP" in the last 168 hours.  Review of Systems:   Unable to obtain in setting of her acute metabolic encephalopathy  Past Medical History:  She,  has a past medical history of Benign essential HTN (10/31/2015), Cardiac tamponade due to viral pericarditis, LBBB (left  bundle branch block), Lung mass, Postoperative atrial fibrillation (HCC), and Pulmonary nodule.   Surgical History:   Past Surgical History:  Procedure Laterality Date   ABDOMINAL HYSTERECTOMY  1988   chest tube placement  09/03/11   LUNG REMOVAL, PARTIAL     right knee arthoscopy with meniscal repair     subxiphoid pericardial window and drainage anterior partial pericardiectomy  09/03/11     Social History:   reports that she quit smoking about 43 years ago. Her smoking use included cigarettes. She has a 15.00 pack-year smoking history. She has never used smokeless tobacco. She reports that she does not drink alcohol and does not use drugs.   Family History:  Her family history includes Hypertension in her father, mother, and sister; Leukemia in her maternal aunt; Ovarian cancer in her mother; Prostate cancer in her brother; Stroke in her father;  Thyroid disease in her sister.   Allergies Allergies  Allergen Reactions   Latex Rash   Penicillins Rash and Itching   Tape Hives and Rash   Z-Pak [Azithromycin] Rash and Other (See Comments)    Mouth sores   Dairycare [Lactase-Lactobacillus] Other (See Comments)    intolerance   Egg-Derived Products Nausea And Vomiting   Potassium Nausea And Vomiting   Amoxil [Amoxicillin] Rash   Sulfa Antibiotics Rash     Home Medications  Prior to Admission medications   Medication Sig Start Date End Date Taking? Authorizing Provider  acetaminophen (TYLENOL) 325 MG tablet Take 650 mg by mouth every 6 (six) hours as needed for mild pain or moderate pain.   Yes [provider]  azelastine (OPTIVAR) 0.05 % ophthalmic solution Apply 1 drop to eye 2 (two) times daily. 09/16/22  Yes [provider]  benzonatate (TESSALON) 100 MG capsule Take 1 capsule (100 mg total) by mouth every 8 (eight) hours. Patient taking differently: Take 100 mg by mouth every 8 (eight) hours as needed for cough. 10/01/22  Yes Plunkett, Alphonzo Lemmings, MD  doxycycline  (VIBRAMYCIN) 100 MG capsule Take 100 mg by mouth 2 (two) times daily.   Yes [provider]  furosemide (LASIX) 20 MG tablet Take 1 tablet (20 mg total) by mouth daily. 07/21/22 10/11/22 Yes [name], MD  metoprolol succinate (TOPROL-XL) 25 MG 24 hr tablet Take 0.5 tablets (12.5 mg total) by mouth daily. Patient taking differently: Take 12.5 mg by mouth at bedtime. 06/26/22  Yes [name], [name], MD  Multiple Vitamin (MULTIVITAMIN WITH MINERALS) TABS tablet Take 1 tablet by mouth daily.   Yes [provider]  olopatadine (PATADAY) 0.1 % ophthalmic solution Place 1 drop into both eyes daily as needed for allergies.   Yes [provider]  polyethylene glycol powder (MIRALAX) 17 GM/SCOOP powder Take 17 g by mouth 2 (two) times daily. Decrease to once daily if having watery stools. 07/16/22  Yes [name], MD  triamcinolone (KENALOG) 0.025 % ointment Apply 1 Application topically 2 (two) times daily. Upper back, arms, legs 08/13/22  Yes [provider]  ciprofloxacin (CIPRO) 500 MG tablet Take 500 mg by mouth daily. Patient not taking: Reported on 10/11/2022 09/22/22   [provider]  fluconazole (DIFLUCAN) 100 MG tablet Take 100 mg by mouth once. Patient not taking: Reported on 10/11/2022    [provider]  fosfomycin (MONUROL) 3 g PACK Take 3 g by mouth once. Patient not taking: Reported on 10/11/2022 10/04/22   [provider]  hydrochlorothiazide (HYDRODIURIL) 12.5 MG tablet Take 12.5 mg by mouth daily. Patient not taking: Reported on 10/11/2022 08/16/22   [provider]  nitrofurantoin (MACRODANTIN) 100 MG capsule Take 100 mg by mouth every 12 (twelve) hours. Patient not taking: Reported on 10/11/2022 09/11/22   [provider]  nitrofurantoin, macrocrystal-monohydrate, (MACROBID) 100 MG capsule Take 1 capsule (100 mg total) by mouth every 12 (twelve) hours. Patient not taking: Reported on 10/11/2022 08/13/22    Milderd Meager., MD  nystatin (MYCOSTATIN) 100000 UNIT/ML suspension Take 5 mLs by mouth 4 (four) times daily. Patient not taking: Reported on 10/11/2022 09/13/22   [provider]     Critical care time: na

## 2022-10-23 ENCOUNTER — Encounter: Payer: Medicare Other | Admitting: Dietician

## 2022-10-23 DIAGNOSIS — Z66 Do not resuscitate: Secondary | ICD-10-CM | POA: Diagnosis not present

## 2022-10-23 DIAGNOSIS — R4589 Other symptoms and signs involving emotional state: Secondary | ICD-10-CM

## 2022-10-23 DIAGNOSIS — Z7189 Other specified counseling: Secondary | ICD-10-CM

## 2022-10-23 DIAGNOSIS — Z515 Encounter for palliative care: Secondary | ICD-10-CM | POA: Diagnosis not present

## 2022-10-23 DIAGNOSIS — R29898 Other symptoms and signs involving the musculoskeletal system: Secondary | ICD-10-CM | POA: Diagnosis not present

## 2022-10-23 DIAGNOSIS — R630 Anorexia: Secondary | ICD-10-CM

## 2022-10-23 DIAGNOSIS — R410 Disorientation, unspecified: Secondary | ICD-10-CM | POA: Diagnosis not present

## 2022-10-23 DIAGNOSIS — R34 Anuria and oliguria: Secondary | ICD-10-CM | POA: Diagnosis not present

## 2022-10-23 DIAGNOSIS — R627 Adult failure to thrive: Secondary | ICD-10-CM | POA: Diagnosis not present

## 2022-10-23 DIAGNOSIS — J189 Pneumonia, unspecified organism: Secondary | ICD-10-CM | POA: Diagnosis not present

## 2022-10-23 LAB — URINALYSIS, W/ REFLEX TO CULTURE (INFECTION SUSPECTED)
Bilirubin Urine: NEGATIVE
Glucose, UA: NEGATIVE mg/dL
Hgb urine dipstick: NEGATIVE
Ketones, ur: 5 mg/dL — AB
Leukocytes,Ua: NEGATIVE
Nitrite: NEGATIVE
Protein, ur: NEGATIVE mg/dL
Specific Gravity, Urine: 1.006 (ref 1.005–1.030)
pH: 6 (ref 5.0–8.0)

## 2022-10-23 LAB — BASIC METABOLIC PANEL
Anion gap: 7 (ref 5–15)
BUN: 8 mg/dL (ref 8–23)
CO2: 29 mmol/L (ref 22–32)
Calcium: 8.3 mg/dL — ABNORMAL LOW (ref 8.9–10.3)
Chloride: 104 mmol/L (ref 98–111)
Creatinine, Ser: 0.57 mg/dL (ref 0.44–1.00)
GFR, Estimated: >60 mL/min (ref >60)
Glucose, Bld: 78 mg/dL (ref 70–99)
Potassium: 3.9 mmol/L (ref 3.5–5.1)
Sodium: 140 mmol/L (ref 135–145)

## 2022-10-23 LAB — CBC
HCT: 28 % — ABNORMAL LOW (ref 36.0–46.0)
Hemoglobin: 9.5 g/dL — ABNORMAL LOW (ref 12.0–15.0)
MCH: 29 pg (ref 26.0–34.0)
MCHC: 33.9 g/dL (ref 30.0–36.0)
MCV: 85.4 fL (ref 80.0–100.0)
Platelets: 128 10*3/uL — ABNORMAL LOW (ref 150–400)
RBC: 3.28 MIL/uL — ABNORMAL LOW (ref 3.87–5.11)
RDW: 17.9 % — ABNORMAL HIGH (ref 11.5–15.5)
WBC: 6.9 10*3/uL (ref 4.0–10.5)
nRBC: 0 % (ref 0.0–0.2)

## 2022-10-23 LAB — FOLATE: Folate: 7.3 ng/mL (ref >5.9)

## 2022-10-23 LAB — VITAMIN B12: Vitamin B-12: 1051 pg/mL — ABNORMAL HIGH (ref 180–914)

## 2022-10-23 MED ORDER — FOOD THICKENER (SIMPLYTHICK)
5.0000 | ORAL | Status: DC | PRN
  Administered 2022-10-25: 5 via ORAL
  Filled 2022-10-23 (×3): qty 5

## 2022-10-23 MED ORDER — TAMSULOSIN HCL 0.4 MG PO CAPS
0.4000 mg | ORAL_CAPSULE | Freq: Every day | ORAL | Status: AC
  Administered 2022-10-23 – 2022-10-29 (×6): 0.4 mg via ORAL
  Filled 2022-10-23 (×7): qty 1

## 2022-10-23 MED ORDER — MEGESTROL ACETATE 40 MG PO TABS
40.0000 mg | ORAL_TABLET | Freq: Every day | ORAL | Status: DC
  Filled 2022-10-23: qty 1

## 2022-10-23 MED ORDER — WITCH HAZEL-GLYCERIN EX PADS
MEDICATED_PAD | CUTANEOUS | Status: DC | PRN
  Administered 2022-10-23 – 2022-10-24 (×3): 1 via TOPICAL
  Filled 2022-10-23: qty 100

## 2022-10-23 MED ORDER — THIAMINE HCL 100 MG/ML IJ SOLN
100.0000 mg | Freq: Every day | INTRAMUSCULAR | Status: DC
  Administered 2022-10-23 – 2022-10-26 (×4): 100 mg via INTRAVENOUS
  Filled 2022-10-23 (×4): qty 2

## 2022-10-23 NOTE — Progress Notes (Signed)
Daily Progress Note   Patient Name: Barbara Nelson       Date: 10/23/2022 DOB: Jun 23, 1940  Age: 82 y.o. MRN#: [id number] Attending Physician: Cathren Harsh, MD Primary Care Physician: [name], MD Admit Date: 10/11/2022 Length of Stay: 10 days  Reason for Consultation/Follow-up: Establishing goals of care  Subjective:   CC: Patient laying in bed, smiling at times to this provider. Multiple family members at bedside. Following up regarding complex medical decision making.   Subjective:  Reviewed EMR and discussed care with hospitalist prior to presenting to bedside.  Discussed with hospitalist not initiating Megace for appetite stimulation due to increased risk of blood clots.  Noted would follow-up with family regarding symptom management.  Presented to bedside to meet with patient.  Multiple family members outside of the room waiting as noted RN was cleaning up patient due to needing in and out catheter for urinary retention.  Able to introduce myself as a member of the palliative medicine team though had met multiple family members previously.  Once RN had cleaned up patient, able to present to bedside with family.  Multiple family members placed on speaker phone as well for discussion.  Dr. Thora Lance, PCCM provider, also joined at bedside for majority of conversation.  Family able to update that patient has had a much better day today.  Noted patient ate better last night and got some rest.  Patient attempting to eat breakfast as well this morning and more interactive with family.  Patient is laying in bed and though will rest her eyes at times, will also open eyes more easily today and interact with family and providers.  Patient even smiling denying any symptom concerns at this time.  Family noted that patient heard mention of feeding tubes and so is very motivated to not have a feeding tube and is attempting to eat on her own.  Remission, then spent time discussing use of appetite  stimulants in the geriatric population.  Discussed multiple different appetite stimulants though importance that data is not great for actually improving overall quality of life and nutrition.  Also discussed can come with adverse effects.  Discussed patient will not be started on Megace due to concerns of creating blood clots family agreed they did not want to start this medication.  Did also discuss idea of stimulants to help patient be more awake and active during the day to stimulate an appetite.  Spent time discussing Adderall and Ritalin for use of management and this.  Again discussed risk and benefits of medications and the geriatric population.  At this time, family electing to not add on appetite stimulant or stimulant in general as patient is "doing much better" today and they are hopeful this will continue.  Spent time discussing overall geriatric recommendations.  Encouraging patient to get out of the bed and maintaining good day and night sleep cycle.  Also discussed would not recommend use of feeding tubes in geriatric population to force food as can cause multiple risk including infection.  Recommended instead modifications to diet and ways to eat to encourage oral intake.  Spent time answering questions regarding this. Dr. Thora Lance also able to discuss gust aspects of care from his perspective.  Planning on CT for further imaging over the weekend to monitor patient's respiratory status.  Family very hopeful for patient's continued improvement and discussed how patient has a whole team behind her knowing that she can get better.  Noted medical team hoping for this as well  and appreciate family's continued application for patient.  All questions answered at that time.  Provide emotional support via active listening.  Thanked patient for allowing me to visit with her and her family today.  Updated hospitalist and bedside RN regarding discussion with family.  Review of Systems Patient denied any  symptoms of concern. Objective:   Vital Signs:  BP (!) 143/69 (BP Location: Left Arm)   Pulse 79   Temp 98.5 F (36.9 C) (Oral)   Resp 17   Ht 5\' 6"  (1.676 m)   Wt 90.7 kg   LMP  (LMP Unknown)   SpO2 98%   BMI 32.27 kg/m   Physical Exam: General: NAD, laying in bed, more awake and interactive today, chronically ill-appearing Eyes: No drainage noted HENT: moist mucous membranes Cardiovascular: RRR, edema in LE b/l Respiratory: no increase Neuro: will interact with straight forward questions today   Imaging:  I personally reviewed recent imaging.   Assessment & Plan:   Assessment: Patient is an 82 year old female with a past medical history of aspiration pneumonia, constipation, generalized deconditioning, degenerative joint disease, and recurrent urinary tract infections who was admitted on 10/11/2022 for management worsening malaise and weakness for the past few weeks.  Since admission patient has received management for aspiration pneumonia and sepsis secondary to this, odynophagia with question of candidiasis, and electrolyte abnormalities.  Hospitalization complicated by poor oral intake, encephalopathy, and inability to consistently along with work with therapy.  Palliative medicine team consulted to assist with complex medical decision making.  Recommendations/Plan: # Complex medical decision making/goals of care:  - Gust care with patient while multiple family members at bedside as described above in HPI.  Family continues to advocate to "fix the fixable".  Family very hopeful that patient will be able to return to her prior functional status with the time.  Patient was noted to be more interactive today and attempting to eat.  -  Code Status: DNR  # Symptom management:  -Poor appetite Discussed idea of appetite stimulants overall with patient and family members present.  Also discussed use of stimulants and geriatric population to assist with mood and activity  management.  At this time with patient's "improvement", family does not want to add additional medications which is very understandable.  Will continue to monitor.  Did discuss with family that any intervention such as feeding tube would include multiple risks to patient's health and so instead would attempt continued oral feeding with modifications to diet and ways to feed (ex: hand feeding) as needed.  # Psychosocial Support:  -Multiple family members at bedside for discussion today  # Discharge Planning: Patient had previously been accepted to rehab. Awaiting medical clearance.   -PMT has already recommended outpatient palliative medicine follow up with rehab.   Discussed with: hospitalist, RN, patient/patient's family at bedside, PCCM provider  Thank you for allowing the palliative care team to participate in the care Elba Barman.  Alvester Morin, DO Palliative Care Provider PMT # [phone]  If patient remains symptomatic despite maximum doses, please call PMT at [phone] between 0700 and 1900. Outside of these hours, please call attending, as PMT does not have night coverage.  This provider spent a total of 52 minutes providing patient's care.  Includes review of EMR, discussing care with other staff members involved in patient's medical care, obtaining relevant history and information from patient and/or patient's family, and personal review of imaging and lab work. Greater than 50% of the time was spent counseling  and coordinating care related to the above assessment and plan.    *Please note that this is a verbal dictation therefore any spelling or grammatical errors are due to the "[employer] One" system interpretation.

## 2022-10-23 NOTE — Progress Notes (Addendum)
10/23/22 1400  SLP Visit Information  SLP Received On 10/23/22  General Information  Behavior/Cognition Lethargic/Drowsy;Other (Comment) (only opened her eyes a few times during session; inconsistently followed directions)  Patient Positioning Upright in bed  Oral care provided Yes  HPI Barbara Nelson is a 82 y.o. 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  Most of the history is provided by the patient's daughter, as the patient is slightly confused and quite sleepy, though she is interactive.  Daughter who is at the bedside states that patient at baseline is quite sharp, and independent.  However being back in February she was diagnosed with a urinary tract infection, and has been quite weak since that time and was just getting back to feeling like her normal self.  Came back to the ER on the 16th of this month with concerns about bronchitis, was diagnosed with UTI and eventually prescribed antibiotics.  She never seem to get her strength back after this.  Over the last few days, patient has been slightly confused, has been very weak not eating or drinking very much.  Family also noticed that she seems to choke and cough whenever she tries to eat or drink something.  This has happened to her intermittently in the past, but is more consistent now.  Seen by SLP in February 2024 - dys1/2 and thin liquids. No MBS.  Treatment Provided  Treatment provided Dysphagia  Dysphagia Treatment  Temperature Spikes Noted No  Respiratory Status Supplemental O2 delivered via (comment)  Treatment Methods Skilled observation;Compensation strategy training;Patient/caregiver education (pt's Camera operator in room with her)  Geophysicist/field seismologist Educated daughter and pt's sister  Patient observed directly  with PO's Yes  Type of PO's observed Nectar-thick liquids  Feeding Total assist  Liquids provided via Straw;Teaspoon;Cup  Oral Phase Signs & Symptoms Oral holding;Prolonged bolus formation  Pharyngeal Phase Signs & Symptoms Suspected delayed swallow initiation  Type of cueing Verbal;Tactile;Visual  Amount of cueing Total  Pain Assessment  Pain Assessment No/denies pain  Faces Pain Scale 0  SLP - End of Session  Patient left in bed;with call bell/phone within reach;with family/visitor present  Nurse Communication Diet recommendation;Swallow strategies reviewed;Aspiration precautions reviewed  Assessment / Recommendations / Plan  Plan Continue with current plan of care  Dysphagia Recommendations  Diet recommendations Nectar-thick liquid;Thin liquid (tsps of thin)  Liquids provided via Cup;Straw  Medication Administration Crushed with puree  Supervision Patient able to self feed  Compensations Slow rate;Small sips/bites;Other (Comment) (ora suction if pt does not swallow)  Postural Changes and/or Swallow Maneuvers Seated upright 90 degrees;Upright 30-60 min after meal  General Recommendations  Oral Care Recommendations Oral care QID  Follow Up Recommendations No SLP follow up  Assistance recommended at discharge  (total assistance)  SLP Visit Diagnosis Dysphagia, oral phase (R13.11)  Progression Toward Goals  Progression toward goals Not progressing toward goals (comment)  Patient/Family Stated Goal per notes, family wants pt to have more po intake; they advised pt was swallowing better prior to SlP arriving to room  SLP Time Calculation  SLP Start Time (ACUTE ONLY) 1245  SLP Stop Time (ACUTE ONLY) 1301  SLP Time Calculation (min) (ACUTE ONLY) 16 min  SLP Evaluations  $ SLP Speech Visit 1 Visit  SLP Evaluations  $Swallowing Treatment 1 Procedure    Patient seen today  for dysphagia treatment -  SLP arrived to room with niece and private caregiver present.  Pt was just  finishing consuming some nutritional supplement.   As SLP was walking into the pt's room, hear family advising her to "swallow".  Advised family would like to observe pt consume more po intake.  Pt did for suction on straw but orally held bolus in anterior oral cavity for several minutes.  Attempts at dry spoon stimulation to lingual musculature not effective.   However pt did finally elicit swallow - chin extension and counting 1,2,3... to initiate swallowing sequence may have been helpful.  Delayed cough noted with pt bringing up viscous white tinged secretions - ? Mixed with the nutritional supplement she had just swallowed.  During today's session, during SLP observation pt continuing to orally hold boluses - and gravity likely source of oral transiting. However family reported pt was swallowing boluses much more readily prior to SLP visit.  Provided water from pink toothette for oral care and suction to clear retention.  [name] tongue continues with thick coating - (not observed on bilateral buccal region) white and tan colored - concerning for ongoing oral candidiasis.  Fortunately pt denies discomfort and did not appear uncomfortable when she swallowed.  Given pt's family provides careful hand feeding, recommend continue po diet with mitigation strategies to maximize airway protection.  Advised to continue to try small single ice chips and tsps of thin water when pt fully alert, accepting and eliciting swallow.  If deemed appropriate and align with goals, dietary restriction lifting is a feasible option.  Ms Asfour is an elevated aspiration risk, dehydration and malnutrition regardless of her diet due to her dysphagia in this SLP's humble opinion.       [name], MS [facility] SLP Acute [employer] [phone]

## 2022-10-23 NOTE — Progress Notes (Signed)
[facility]                                                                              Barbara Nelson, is a 82 y.o. female, DOB - 01-11-1941, WGN:[id number] Admit date - 10/11/2022    Outpatient Primary MD for the patient is [name], MD  LOS - 10  days  Chief Complaint  Patient presents with   Urinary Tract Infection       Brief summary   Patient is a 82 year old female, lives with daughter, PMH recurrent UTI since February with gradual physical decline, who presented with generalized weakness, poor oral intake, confusion, difficulty swallowing with choking and coughing. Admitted for aspiration pneumonia. Possible UTI. History of multiple episodes of pneumonia and UTI since February.    Patient admitted as above with recurrent episode of pneumonia with moderate hypoxia and respiratory distress as well as concurrent questionable UTI.  Patient completed antibiotic course but continues to have respiratory symptoms and hypoxia.  Speech evaluation improved that patient does have moderately profound dysphagia requiring nectar thick liquids which has limited her p.o. intake in both the caloric and free water aspects.  Given patient's poor nutritional status, ongoing aspiration, and concern for failure to thrive palliative care was consulted.   Assessment & Plan    Principal Problem:   Aspiration pneumonia (HCC), sepsis POA Acute respiratory failure with hypoxia, POA -Underlying history of chronic pulmonary blastomycosis, s/p lobectomy in 2014, chronic respiratory failure on 2 L O2 via , -Completed empiric antibiotics, wean O2 as tolerated.  Sepsis physiology resolved -SLP evaluation recommended dysphagia 1 diet with nectar thick liquids -Daughter stating abrupt change in her ability to tolerate PO's  "she was eating fine before admission", CT head negative for acute CVA or acute pathology. -Chest x-ray 6/4 showed pulmonary edema, bilateral pleural effusions,  increased on the right, increased retrocardiac airspace opacity atelectasis or pneumonia -BNP 162.5, continue IV Lasix, increased to 20 mg IV twice daily  -Pulmonology following, will get CT chest this weekend   Active Problems: Odynophagia, oral candidiasis, ?  Pharyngeal and esophageal candidiasis -Currently on Diflucan, started on 5/28, Magic mouthwash -On dysphagia 1 pured diet with nectar thick liquids -Continue Carafate, Diflucan, Magic mouthwash, PPI  -GI following, appreciate recommendations -Palliative medicine following  Acute metabolic encephalopathy, failure to thrive, generalized debility -Per previous documentation, patient has been struggling with multiple episodes of PNA, UTI since February.  Has had multiple rounds of antibiotics with minimal improvement.  -Intermittent improvement, however was much alert and oriented with me and patient's sister at the bedside earlier this morning.  Hyponatremia -Resolved  Hypokalemia -Resolved    Malnutrition of moderate degree, failure to thrive Etiology: chronic illness Signs/Symptoms: energy intake < 75% for > or equal to 1 month, percent weight loss (9.5% in 5 months) Percent weight loss: 9.5 % (in 5 months) Interventions: Refer to RD note for recommendations, Magic cup, Prostat, MVI Estimated body mass index is 32.27 kg/m as calculated from the following:   Height as of this encounter: 5\' 6"  (1.676 m).   Weight as of this encounter: 90.7 kg. -Patient's family is concerned about patient's  poor nutritional status and appetite.  Discussed about Remeron however patient has been mostly somnolent, Megace with side effects of blood clots.  Patient's daughter on the phone had questions about artificial feeding, discussed with palliative medicine will follow-up today   Urinary retention -Bladder scan showed  > 460 cc residual, obtain UA and culture with straight cath and Foley catheter if continues to retain -Add Flomax for 7  days  Code Status: DNI DVT Prophylaxis:  enoxaparin (LOVENOX) injection 40 mg Start: 10/11/22 2200 SCDs Start: 10/11/22 1132   Level of Care: Level of care: Med-Surg Family Communication: Updated patient's daughter and sister at the bedside and brother, daughter on the speaker phone again today Disposition Plan:      Remains inpatient appropriate:      Procedures:    Consultants:   Palliative medicine GI Pulmonology Antimicrobials:   Anti-infectives (From admission, onward)    Start     Dose/Rate Route Frequency Ordered Stop   10/13/22 1400  fluconazole (DIFLUCAN) IVPB 200 mg       See Hyperspace for full Linked Orders Report.   200 mg 100 mL/hr over 60 Minutes Intravenous Daily 10/12/22 1138     10/13/22 1000  fluconazole (DIFLUCAN) IVPB 100 mg  Status:  Discontinued       See Hyperspace for full Linked Orders Report.   100 mg 50 mL/hr over 60 Minutes Intravenous Daily 10/12/22 1036 10/12/22 1138   10/12/22 1130  fluconazole (DIFLUCAN) IVPB 200 mg       See Hyperspace for full Linked Orders Report.   200 mg 100 mL/hr over 60 Minutes Intravenous  Once 10/12/22 1036 10/13/22 1114   10/12/22 1000  cefTRIAXone (ROCEPHIN) 2 g in sodium chloride 0.9 % 100 mL IVPB        2 g 200 mL/hr over 30 Minutes Intravenous Every 24 hours 10/11/22 1148 10/15/22 1327   10/12/22 1000  metroNIDAZOLE (FLAGYL) IVPB 500 mg        500 mg 100 mL/hr over 60 Minutes Intravenous Every 12 hours 10/11/22 1148 10/15/22 2255   10/11/22 1145  levofloxacin (LEVAQUIN) IVPB 500 mg  Status:  Discontinued        500 mg 100 mL/hr over 60 Minutes Intravenous Every 24 hours 10/11/22 1130 10/11/22 1146   10/11/22 0900  levofloxacin (LEVAQUIN) IVPB 750 mg        750 mg 100 mL/hr over 90 Minutes Intravenous  Once 10/11/22 0858 10/11/22 1127          Medications  (feeding supplement) PROSource Plus  30 mL Oral TID BM   acetaminophen  650 mg Oral TID   enoxaparin (LOVENOX) injection  40 mg Subcutaneous  Q24H   furosemide  20 mg Intravenous BID   ketotifen  1 drop Both Eyes BID   magic mouthwash  5 mL Oral QID   metoprolol succinate  12.5 mg Oral Daily   multivitamin with minerals  1 tablet Oral Daily   pantoprazole (PROTONIX) IV  40 mg Intravenous Daily   polyethylene glycol  17 g Oral BID   sucralfate  1 g Oral TID WC & HS   thiamine (VITAMIN B1) injection  100 mg Intravenous Daily      Subjective:   Barbara Nelson was seen and examined today.  Patient much more alert, interactive today while sister in the room in the morning.  No acute issues overnight, tolerating however pocketing food, has poor appetite.  No fevers or chest pain, nausea vomiting or abdominal  pain.   Objective:   Vitals:   10/22/22 0640 10/22/22 1704 10/22/22 2237 10/23/22 0614  BP: 129/68 (!) 148/85 110/63 (!) 143/69  Pulse: 90 91 95 79  Resp: 18 18 17 17   Temp: 98.5 F (36.9 C) 97.9 F (36.6 C) 98.5 F (36.9 C) 98.5 F (36.9 C)  TempSrc:   Oral Oral  SpO2: 100% 90% 99% 98%  Weight:      Height:        Intake/Output Summary (Last 24 hours) at 10/23/2022 1051 Last data filed at 10/23/2022 0600 Gross per 24 hour  Intake 313.18 ml  Output --  Net 313.18 ml     Wt Readings from Last 3 Encounters:  10/11/22 90.7 kg  10/01/22 90.7 kg  08/19/22 92.1 kg   Physical Exam General: Much more alert, and oriented this morning initially, answered questions appropriately, subsequently was somnolent or not responding deliberately Cardiovascular: S1 S2 clear, RRR.  Respiratory: Diminished breath sound at the bases otherwise fairly CTA Gastrointestinal: Soft, nontender, nondistended, NBS Ext: trace pedal edema bilaterally Neuro: strength 5/5 in upper extremities, was able to wiggle toes but did not lift up legs, withdraws to pain stimuli Psych: initially alert, subsequently somnolent and Normal affect and demeanor, alert and oriented x3    Data Reviewed:  I have personally reviewed following labs     CBC Lab Results  Component Value Date   WBC 6.9 10/23/2022   RBC 3.28 (L) 10/23/2022   HGB 9.5 (L) 10/23/2022   HCT 28.0 (L) 10/23/2022   MCV 85.4 10/23/2022   MCH 29.0 10/23/2022   PLT 128 (L) 10/23/2022   MCHC 33.9 10/23/2022   RDW 17.9 (H) 10/23/2022   LYMPHSABS 1.1 10/11/2022   MONOABS 0.5 10/11/2022   EOSABS 0.1 10/11/2022   BASOSABS 0.0 10/11/2022     Last metabolic panel Lab Results  Component Value Date   NA 140 10/23/2022   K 3.9 10/23/2022   CL 104 10/23/2022   CO2 29 10/23/2022   BUN 8 10/23/2022   CREATININE 0.57 10/23/2022   GLUCOSE 78 10/23/2022   GFRNONAA >60 10/23/2022   GFRAA 57 (L) 03/03/2012   CALCIUM 8.3 (L) 10/23/2022   PHOS 2.5 07/10/2022   PROT 5.2 (L) 10/21/2022   ALBUMIN 1.5 (L) 10/21/2022   BILITOT 0.4 10/21/2022   ALKPHOS 168 (H) 10/21/2022   AST 36 10/21/2022   ALT 20 10/21/2022   ANIONGAP 7 10/23/2022    CBG (last 3)  No results for input(s): "GLUCAP" in the last 72 hours.    Coagulation Profile: No results for input(s): "INR", "PROTIME" in the last 168 hours.   Radiology Studies: I have personally reviewed the imaging studies  No results found.     Thad Ranger M.D. [facility] 10/23/2022, 10:51 AM  Available via Epic secure chat 7am-7pm After 7 pm, please refer to night coverage provider listed on amion.

## 2022-10-23 NOTE — Progress Notes (Signed)
Occupational Therapy Treatment Patient Details Name: Barbara Nelson MRN: [id number] DOB: 23-Jul-1940 Today's Date: 10/23/2022   History of present illness Patient is a 82 year old female who presented with decreased urinary output, difficulty with mobility and swallowing. Patient was admitted with aspiration pneumonia, hyponatremia, PMH: HTN, recent UTI.   OT comments  Patient with some improvement to LOA this session.  Patient with eyes open for 50% of session.  Continues to need total assist for basic bed mobility, with patient c/o dizziness with sitting EOB.  Unable to check BP.  LOA declined, so patient lied back on bed with improved LOA and interaction.  OT continues to be indicated in the acute setting to address deficits, and Patient will benefit from continued inpatient follow up therapy, <3 hours/day    Recommendations for follow up therapy are one component of a multi-disciplinary discharge planning process, led by the attending physician.  Recommendations may be updated based on patient status, additional functional criteria and insurance authorization.    Assistance Recommended at Discharge Frequent or constant Supervision/Assistance  Patient can return home with the following  Two people to help with walking and/or transfers;Two people to help with bathing/dressing/bathroom;Assistance with cooking/housework;Direct supervision/assist for medications management;Assist for transportation;Help with stairs or ramp for entrance;Direct supervision/assist for financial management   Equipment Recommendations       Recommendations for Other Services      Precautions / Restrictions Precautions Precautions: Fall Precaution Comments: ? orthostatic Restrictions Weight Bearing Restrictions: No       Mobility Bed Mobility Overal bed mobility: Needs Assistance Bed Mobility: Sidelying to Sit, Sit to Sidelying   Sidelying to sit: Total assist     Sit to sidelying: Total assist       Transfers                         Balance Overall balance assessment: Needs assistance Sitting-balance support: Feet supported, Bilateral upper extremity supported Sitting balance-Leahy Scale: Poor   Postural control: Posterior lean, Left lateral lean                                 ADL either performed or assessed with clinical judgement   ADL   Eating/Feeding: Maximal assistance;Bed level               Upper Body Dressing : Maximal assistance;Bed level   Lower Body Dressing: Total assistance;Bed level                      Extremity/Trunk Assessment Upper Extremity Assessment Upper Extremity Assessment: Generalized weakness   Lower Extremity Assessment Lower Extremity Assessment: Defer to PT evaluation   Cervical / Trunk Assessment Cervical / Trunk Assessment: Kyphotic                      Cognition Arousal/Alertness: Lethargic Behavior During Therapy: Flat affect Overall Cognitive Status: Difficult to assess                                                             Pertinent Vitals/ Pain       Pain Assessment Pain Assessment: Faces Faces Pain Scale: No hurt Pain Intervention(s):  Monitored during session                                                          Frequency  Min 2X/week        Progress Toward Goals  OT Goals(current goals can now be found in the care plan section)  Progress towards OT goals: Progressing toward goals  Acute Rehab OT Goals OT Goal Formulation: Patient unable to participate in goal setting Time For Goal Achievement: 11/09/22 Potential to Achieve Goals: Fair  Plan Discharge plan remains appropriate    Co-evaluation                 AM-PAC OT "6 Clicks" Daily Activity     Outcome Measure   Help from another person eating meals?: Total Help from another person taking care of personal grooming?: A Lot Help from  another person toileting, which includes using toliet, bedpan, or urinal?: Total Help from another person bathing (including washing, rinsing, drying)?: Total Help from another person to put on and taking off regular upper body clothing?: A Lot Help from another person to put on and taking off regular lower body clothing?: Total 6 Click Score: 8    End of Session    OT Visit Diagnosis: Pain;Muscle weakness (generalized) (M62.81);Other symptoms and signs involving the nervous system (R29.898)   Activity Tolerance Patient limited by lethargy   Patient Left in bed;with call bell/phone within reach;with family/visitor present   Nurse Communication Mobility status        Time: 1610-9604 OT Time Calculation (min): 16 min  Charges: OT Treatments $Self Care/Home Management : 8-22 mins  10/23/2022  RP, OTR/L  Acute Rehabilitation Services  Office:  [phone]   Suzanna Obey 10/23/2022, 5:01 PM

## 2022-10-23 NOTE — Progress Notes (Signed)
[facility] Progress Note  SUBJECTIVE:   Interval history: Barbara Nelson was seen and evaluated today at bedside.  She was awake and alert (the best that I have seen during my evaluations).  She was able to answer questions, she denied any odynophagia.  Family present at bedside and believe that sucralfate has helped with her symptoms.  She was able to consume food today as well as her pills with applesauce.  Patient denied any abdominal pain.  She denied any nausea or vomiting.  Past Medical History:  Diagnosis Date   Benign essential HTN 10/31/2015   Cardiac tamponade due to viral pericarditis    LBBB (left bundle branch block)    Lung mass    fungal infection s/p resection at [facility]   Postoperative atrial fibrillation [facility])    Pulmonary nodule    Past Surgical History:  Procedure Laterality Date   ABDOMINAL HYSTERECTOMY  1988   chest tube placement  09/03/11   LUNG REMOVAL, PARTIAL     right knee arthoscopy with meniscal repair     subxiphoid pericardial window and drainage anterior partial pericardiectomy  09/03/11   Current Facility-Administered Medications  Medication Dose Route Frequency Provider Last Rate Last Admin   (feeding supplement) PROSource Plus liquid 30 mL  30 mL Oral TID BM Hazeline Junker B, MD   30 mL at 10/23/22 1308   acetaminophen (OFIRMEV) IV 650 mg  650 mg Intravenous Q6H PRN Rai, Ripudeep K, MD 260 mL/hr at 10/23/22 0045 650 mg at 10/23/22 0045   acetaminophen (TYLENOL) tablet 650 mg  650 mg Oral TID Ernie Avena, NP   650 mg at 10/23/22 1308   albuterol (PROVENTIL) (2.5 MG/3ML) 0.083% nebulizer solution 2.5 mg  2.5 mg Nebulization Q2H PRN Kirby Crigler, Mir M, MD   2.5 mg at 10/18/22 1735   enoxaparin (LOVENOX) injection 40 mg  40 mg Subcutaneous Q24H Kirby Crigler, Mir M, MD   40 mg at 10/22/22 2300   fluconazole (DIFLUCAN) IVPB 200 mg  200 mg Intravenous Daily Norva Pavlov, RPH 100 mL/hr at 10/23/22 1306 200 mg at 10/23/22 1306   food  thickener (SIMPLYTHICK (NECTAR/LEVEL 2/MILDLY THICK)) 1 packet  1 packet Oral PRN Azucena Fallen, MD       furosemide (LASIX) injection 20 mg  20 mg Intravenous BID Rai, Ripudeep K, MD   20 mg at 10/23/22 0842   guaiFENesin-dextromethorphan (ROBITUSSIN DM) 100-10 MG/5ML syrup 5 mL  5 mL Oral Q4H PRN Standley Brooking, MD       ketotifen (ZADITOR) 0.035 % ophthalmic solution 1 drop  1 drop Both Eyes BID Kirby Crigler, Mir M, MD   1 drop at 10/22/22 2256   magic mouthwash  5 mL Oral QID Standley Brooking, MD   5 mL at 10/23/22 1312   metoprolol succinate (TOPROL-XL) 24 hr tablet 12.5 mg  12.5 mg Oral Daily Kirby Crigler, Mir M, MD   12.5 mg at 10/22/22 2300   multivitamin with minerals tablet 1 tablet  1 tablet Oral Daily Hazeline Junker B, MD   1 tablet at 10/23/22 1308   ondansetron (ZOFRAN) injection 4 mg  4 mg Intravenous Q6H PRN Kirby Crigler, Mir M, MD       pantoprazole (PROTONIX) injection 40 mg  40 mg Intravenous Daily Hazeline Junker B, MD   40 mg at 10/23/22 0851   polyethylene glycol (MIRALAX / GLYCOLAX) packet 17 g  17 g Oral BID Azucena Fallen, MD   17 g at 10/23/22 1312  sucralfate (CARAFATE) tablet 1 g  1 g Oral TID WC & HS Rai, Ripudeep K, MD   1 g at 10/23/22 1308   tamsulosin (FLOMAX) capsule 0.4 mg  0.4 mg Oral Daily Rai, Ripudeep K, MD   0.4 mg at 10/23/22 1308   thiamine (VITAMIN B1) injection 100 mg  100 mg Intravenous Daily Rai, Ripudeep K, MD   100 mg at 10/23/22 0850   Allergies as of 10/11/2022 - Review Complete 10/11/2022  Allergen Reaction Noted   Latex Rash 08/04/2012   Penicillins Rash and Itching 08/03/2013   Tape Hives and Rash 10/06/2011   Z-pak [azithromycin] Rash and Other (See Comments) 06/28/2014   Egg-derived products Nausea And Vomiting 12/15/2021   Potassium Nausea And Vomiting 08/19/2022   Amoxil [amoxicillin] Rash 06/27/2013   Sulfa antibiotics Rash 10/03/2018   Review of Systems:  Review of Systems  Gastrointestinal:  Negative for abdominal pain,  nausea and vomiting.       No odynophagia.    OBJECTIVE:   Temp:  [97.7 F (36.5 C)-98.5 F (36.9 C)] 97.7 F (36.5 C) (06/07 1415) Pulse Rate:  [79-95] 81 (06/07 1415) Resp:  [16-18] 16 (06/07 1415) BP: (110-148)/(63-85) 141/77 (06/07 1415) SpO2:  [90 %-100 %] 100 % (06/07 1415) Last BM Date : 10/20/22 Physical Exam Constitutional:      General: She is not in acute distress.    Appearance: She is ill-appearing. She is not toxic-appearing or diaphoretic.  Cardiovascular:     Rate and Rhythm: Normal rate and regular rhythm.  Pulmonary:     Effort: No respiratory distress.     Breath sounds: Normal breath sounds.     Comments: Supplemental oxygen in place at time of exam. Abdominal:     General: Bowel sounds are normal. There is no distension.     Palpations: Abdomen is soft.     Tenderness: There is no abdominal tenderness. There is no guarding.  Neurological:     Mental Status: She is alert.     Labs: Recent Labs    10/21/22 0333 10/22/22 0626 10/23/22 0349  WBC 6.6 8.0 6.9  HGB 10.9* 10.8* 9.5*  HCT 32.5* 32.4* 28.0*  PLT 123* 126* 128*   BMET Recent Labs    10/21/22 0333 10/22/22 0626 10/23/22 0349  NA 140 135 140  K 3.0* 3.4* 3.9  CL 106 101 104  CO2 27 27 29   GLUCOSE 82 67* 78  BUN 6* 6* 8  CREATININE 0.57 0.58 0.57  CALCIUM 8.4* 8.1* 8.3*   LFT Recent Labs    10/21/22 0333  PROT 5.2*  ALBUMIN 1.5*  AST 36  ALT 20  ALKPHOS 168*  BILITOT 0.4   PT/INR No results for input(s): "LABPROT", "INR" in the last 72 hours. Diagnostic imaging: No results found.  IMPRESSION: Odynophagia, resolved Impaired oropharyngeal swallow per SLP evaluation, required dietary modification Protein calorie malnutrition History pulmonary blastomycosis s/p R lobectomy Chronic respiratory failure on supplemental oxygen Congestive heart failure Hypertension Diabetes mellitus  PLAN: -Patient appears to be having slow clinical improvement with medication  therapies including PPI, sucralfate and fluconazole -Discussed continued supportive care with family, I do not recommend EGD at this time, they were in agreement -Would continue course of fluconazole for empiric treatment of esophageal candidiasis, then stop -Can continue PPI therapy, transition to oral formulation when patient able -Can continue sucralfate therapy -SLP recommendations regarding dietary modifications -[facility] will sign off and be available for any questions that arise   LOS:  10 days   Liliane Shi, [facility]

## 2022-10-23 NOTE — Progress Notes (Signed)
Mobility Specialist - Progress Note   10/23/22 1036  Mobility  Activity  (Bed level UE Exercises)  Range of Motion/Exercises Active Assistive;Passive;Right arm;Left arm  Activity Response Tolerated well  Mobility Referral Yes  $Mobility charge 1 Mobility  Mobility Specialist Start Time (ACUTE ONLY) 1000  Mobility Specialist Stop Time (ACUTE ONLY) 1016  Mobility Specialist Time Calculation (min) (ACUTE ONLY) 16 min   Pt received in bed and agreeable to bed level UE exercises. Pt was more active today in participation. See below for exercises. No complaints during session.   Supine BUE Exercises: 5 reps each  1) Elbow Flexion (Active/Passive)  2) Elbow extension (Active/Passive)  3) Horizontal Abduction (Active/Passive)             4) Stress Ball Squeezes (Active)             5) Arm raises (Passive)  Chief Technology Officer

## 2022-10-24 ENCOUNTER — Inpatient Hospital Stay (HOSPITAL_COMMUNITY): Payer: Medicare Other

## 2022-10-24 DIAGNOSIS — R34 Anuria and oliguria: Secondary | ICD-10-CM | POA: Diagnosis not present

## 2022-10-24 DIAGNOSIS — Z7189 Other specified counseling: Secondary | ICD-10-CM | POA: Diagnosis not present

## 2022-10-24 DIAGNOSIS — Z515 Encounter for palliative care: Secondary | ICD-10-CM | POA: Diagnosis not present

## 2022-10-24 DIAGNOSIS — J189 Pneumonia, unspecified organism: Secondary | ICD-10-CM | POA: Diagnosis not present

## 2022-10-24 DIAGNOSIS — R410 Disorientation, unspecified: Secondary | ICD-10-CM | POA: Diagnosis not present

## 2022-10-24 DIAGNOSIS — R0600 Dyspnea, unspecified: Secondary | ICD-10-CM | POA: Diagnosis not present

## 2022-10-24 DIAGNOSIS — R29898 Other symptoms and signs involving the musculoskeletal system: Secondary | ICD-10-CM | POA: Diagnosis not present

## 2022-10-24 DIAGNOSIS — Z66 Do not resuscitate: Secondary | ICD-10-CM | POA: Diagnosis not present

## 2022-10-24 LAB — CBC
HCT: 29.7 % — ABNORMAL LOW (ref 36.0–46.0)
Hemoglobin: 10 g/dL — ABNORMAL LOW (ref 12.0–15.0)
MCH: 28.6 pg (ref 26.0–34.0)
MCHC: 33.7 g/dL (ref 30.0–36.0)
MCV: 84.9 fL (ref 80.0–100.0)
Platelets: 140 10*3/uL — ABNORMAL LOW (ref 150–400)
RBC: 3.5 MIL/uL — ABNORMAL LOW (ref 3.87–5.11)
RDW: 17.6 % — ABNORMAL HIGH (ref 11.5–15.5)
WBC: 5.3 10*3/uL (ref 4.0–10.5)
nRBC: 0 % (ref 0.0–0.2)

## 2022-10-24 LAB — BASIC METABOLIC PANEL
Anion gap: 8 (ref 5–15)
BUN: 9 mg/dL (ref 8–23)
CO2: 31 mmol/L (ref 22–32)
Calcium: 8.2 mg/dL — ABNORMAL LOW (ref 8.9–10.3)
Chloride: 99 mmol/L (ref 98–111)
Creatinine, Ser: 0.58 mg/dL (ref 0.44–1.00)
GFR, Estimated: >60 mL/min (ref >60)
Glucose, Bld: 96 mg/dL (ref 70–99)
Potassium: 3 mmol/L — ABNORMAL LOW (ref 3.5–5.1)
Sodium: 138 mmol/L (ref 135–145)

## 2022-10-24 MED ORDER — SODIUM CHLORIDE 0.9 % IV SOLN
2.0000 g | INTRAVENOUS | Status: DC
  Administered 2022-10-24 – 2022-10-28 (×5): 2 g via INTRAVENOUS
  Filled 2022-10-24 (×5): qty 20

## 2022-10-24 MED ORDER — SUCRALFATE 1 G PO TABS
1.0000 g | ORAL_TABLET | Freq: Three times a day (TID) | ORAL | Status: DC
  Administered 2022-10-24 – 2022-10-29 (×11): 1 g via ORAL
  Filled 2022-10-24 (×12): qty 1

## 2022-10-24 MED ORDER — POTASSIUM CHLORIDE 20 MEQ PO PACK
20.0000 meq | PACK | Freq: Two times a day (BID) | ORAL | Status: DC
  Administered 2022-10-24 – 2022-10-30 (×10): 20 meq via ORAL
  Filled 2022-10-24 (×11): qty 1

## 2022-10-24 NOTE — Progress Notes (Signed)
NAME:  Barbara Nelson, MRN:  [id number], DOB:  [birth year], LOS: 11 ADMISSION DATE:  10/11/2022, CONSULTATION DATE:  10/22/22 REFERRING MD:  Isidoro Donning, CHIEF COMPLAINT:  weakness, dyspnea   History of Present Illness:  82yF who lives with daughter, recurrent UTI, chronic pulmonary blastomycosis (a single pet avid nodule) sp lobectomy at [facility] and course of itraconazole in 2014, chronic respiratory failure on 2L O2, chronic diastolic heart failure who was in her USOH until 2 weeks PTA (which was 5/26) developed weakness and malaise. Concern for aspiration pneumonia. She completed 5d course of aspiration pneumonia coverage with ceftriaxone and flagyl. She has also been treated here for OP candidiasis with fluconazole. She has been diuresed - she is net positive 14L here but this is probably inaccurate with mostly uncharted voids. Her course has also been complicated by hypoactive delirium. Speech, palliative care has been following. GI consulted for odynophagia.  Pertinent  Medical History  Recurrent UTI Chronic pulmonary blastomycosis sp lobectomy and course of itraconazole 2014 Chronic hypoxic respiratory failure on 2L O2 Chronic diastolic heart failure (TTE with G1DD 09/2022)  Significant Hospital Events: Including procedures, antibiotic start and stop dates in addition to other pertinent events   5/26 admitted and started on CAP coverage MIVF stopped 6/2 6/3-6/7 diuresing with iv lasix 20 mg daily. 6/4 CXR with new multifocal alveolar opacities  Interim History / Subjective:  Good day yesterday eating and more alert  Objective   Blood pressure (!) 144/84, pulse 81, temperature 97.6 F (36.4 C), temperature source Oral, resp. rate 18, height 5\' 6"  (1.676 m), weight 90.7 kg, SpO2 97 %.        Intake/Output Summary (Last 24 hours) at 10/24/2022 1502 Last data filed at 10/24/2022 0600 Gross per 24 hour  Intake 890 ml  Output --  Net 890 ml   Filed Weights   10/11/22 0524  Weight: 90.7 kg     Examination: General appearance: 82 y.o., female, chronically ill appearing, sleepy Eyes: eyes closed with periorbital edema and erythema HENT: NCAT; MMM Neck: Trachea midline; no lymphadenopathy, no JVD Lungs: diminished bl, with normal respiratory effort CV: RRR, no murmur  Abdomen: Soft, non-tender; non-distended, BS present  Extremities: trace peripheral edema, warm Neuro: withdraws to noxious stim ble   CT Chest with loculated R effusion, small L effusion and LLL consolidation/atelectasis  Resolved Hospital Problem list    Assessment & Plan:   # loculated r pleural effusion, small simple appearing L pleural effusion # LLL consolidation/atelectasis - suspected resolving aspiration pneumonia # acute on chronic diastolic heart failure # At risk for aspiration  # severe protein calorie malnutrition # history of asymptomatic RML nodule from blastomycosis s/p VATS RMLectomy and 6 month course of itraconazole in 2014 # history of cardiac tamponade sp pericardial window 2013 with unrevealing workup for etiology of PCE - [facility] later suspected this was manifestation of her blastomycosis  She has loculated R effusion and simple appearing small left effusion, LLL consolidation that may reflect resolving pna/atelectasis.    - discussed options with family including IR thora, IR pigtail, or more conservatively just empirically treating for the most likely possibility of parapneumonic effusion for 4 week total ABX course. The first two options would allow [place] to assess for pleural space infection (whether typical or less likely manifestation of blastomycosis), malignancy, and may help speed along her recovery but are certainly more uncomfortable - especially the chest tube.  - they are leaning the third option - start IV ceftriaxone  and discharge on cefdinir suspension to complete 4 week course of ABX - will tentatively set up clinic appointment in 4-5 weeks to repeat CXR and see how she's  doing - family will let us know if they think it over and instead wish to pursue either IR thora or IR pigtail for R loculated effusion - would continue diuretic challenge at least through today - wean O2 for saturaiton 92%   Will sign off but glad to be reinvolved as condition changes   Best Practice (right click and "Reselect all SmartList Selections" daily)   Per primary  Labs   CBC: Recent Labs  Lab 10/19/22 0335 10/21/22 0333 10/22/22 0626 10/23/22 0349 10/24/22 0352  WBC 6.6 6.6 8.0 6.9 5.3  HGB 11.1* 10.9* 10.8* 9.5* 10.0*  HCT 32.6* 32.5* 32.4* 28.0* 29.7*  MCV 84.7 84.9 86.4 85.4 84.9  PLT 130* 123* 126* 128* 140*    Basic Metabolic Panel: Recent Labs  Lab 10/20/22 0351 10/21/22 0333 10/22/22 0626 10/23/22 0349 10/24/22 0352  NA 138 140 135 140 138  K 3.2* 3.0* 3.4* 3.9 3.0*  CL 108 106 101 104 99  CO2 25 27 27 29 31   GLUCOSE 74 82 67* 78 96  BUN 5* 6* 6* 8 9  CREATININE 0.55 0.57 0.58 0.57 0.58  CALCIUM 8.2* 8.4* 8.1* 8.3* 8.2*  MG 1.6* 1.9  --   --   --    GFR: Estimated Creatinine Clearance: 61.5 mL/min (by C-G formula based on SCr of 0.58 mg/dL). Recent Labs  Lab 10/21/22 0333 10/22/22 0626 10/23/22 0349 10/24/22 0352  WBC 6.6 8.0 6.9 5.3    Liver Function Tests: Recent Labs  Lab 10/21/22 0333  AST 36  ALT 20  ALKPHOS 168*  BILITOT 0.4  PROT 5.2*  ALBUMIN 1.5*   No results for input(s): "LIPASE", "AMYLASE" in the last 168 hours. No results for input(s): "AMMONIA" in the last 168 hours.  ABG    Component Value Date/Time   PHART 7.51 (H) 10/13/2022 1340   PCO2ART 31 (L) 10/13/2022 1340   PO2ART 64 (L) 10/13/2022 1340   HCO3 24.7 10/13/2022 1340   TCO2 34 (H) 07/01/2022 0830   O2SAT 95.3 10/13/2022 1340     Coagulation Profile: No results for input(s): "INR", "PROTIME" in the last 168 hours.  Cardiac Enzymes: No results for input(s): "CKTOTAL", "CKMB", "CKMBINDEX", "TROPONINI" in the last 168 hours.  HbA1C: Hgb A1c  MFr Bld  Date/Time Value Ref Range Status  06/19/2022 06:57 AM 6.9 (H) 4.8 - 5.6 % Final    Comment:    (NOTE) Pre diabetes:          5.7%-6.4%  Diabetes:              >6.4%  Glycemic control for   <7.0% adults with diabetes     CBG: No results for input(s): "GLUCAP" in the last 168 hours.  Review of Systems:   Unable to obtain in setting of her acute metabolic encephalopathy  Past Medical History:  She,  has a past medical history of Benign essential HTN (10/31/2015), Cardiac tamponade due to viral pericarditis, LBBB (left bundle branch block), Lung mass, Postoperative atrial fibrillation (HCC), and Pulmonary nodule.   Surgical History:   Past Surgical History:  Procedure Laterality Date   ABDOMINAL HYSTERECTOMY  1988   chest tube placement  09/03/11   LUNG REMOVAL, PARTIAL     right knee arthoscopy with meniscal repair     subxiphoid pericardial window and  drainage anterior partial pericardiectomy  09/03/11     Social History:   reports that she quit smoking about 43 years ago. Her smoking use included cigarettes. She has a 15.00 pack-year smoking history. She has never used smokeless tobacco. She reports that she does not drink alcohol and does not use drugs.   Family History:  Her family history includes Hypertension in her father, mother, and sister; Leukemia in her maternal aunt; Ovarian cancer in her mother; Prostate cancer in her brother; Stroke in her father; Thyroid disease in her sister.   Allergies Allergies  Allergen Reactions   Latex Rash   Penicillins Rash and Itching   Tape Hives and Rash   Z-Pak [Azithromycin] Rash and Other (See Comments)    Mouth sores   Dairycare [Lactase-Lactobacillus] Other (See Comments)    intolerance   Egg-Derived Products Nausea And Vomiting   Potassium Nausea And Vomiting   Amoxil [Amoxicillin] Rash   Sulfa Antibiotics Rash     Home Medications  Prior to Admission medications   Medication Sig Start Date End Date  Taking? Authorizing Provider  acetaminophen (TYLENOL) 325 MG tablet Take 650 mg by mouth every 6 (six) hours as needed for mild pain or moderate pain.   Yes [provider]  azelastine (OPTIVAR) 0.05 % ophthalmic solution Apply 1 drop to eye 2 (two) times daily. 09/16/22  Yes [provider]  benzonatate (TESSALON) 100 MG capsule Take 1 capsule (100 mg total) by mouth every 8 (eight) hours. Patient taking differently: Take 100 mg by mouth every 8 (eight) hours as needed for cough. 10/01/22  Yes Plunkett, Alphonzo Lemmings, MD  doxycycline (VIBRAMYCIN) 100 MG capsule Take 100 mg by mouth 2 (two) times daily.   Yes [provider]  furosemide (LASIX) 20 MG tablet Take 1 tablet (20 mg total) by mouth daily. 07/21/22 10/11/22 Yes [name], MD  metoprolol succinate (TOPROL-XL) 25 MG 24 hr tablet Take 0.5 tablets (12.5 mg total) by mouth daily. Patient taking differently: Take 12.5 mg by mouth at bedtime. 06/26/22  Yes [name], [name], MD  Multiple Vitamin (MULTIVITAMIN WITH MINERALS) TABS tablet Take 1 tablet by mouth daily.   Yes [provider]  olopatadine (PATADAY) 0.1 % ophthalmic solution Place 1 drop into both eyes daily as needed for allergies.   Yes [provider]  polyethylene glycol powder (MIRALAX) 17 GM/SCOOP powder Take 17 g by mouth 2 (two) times daily. Decrease to once daily if having watery stools. 07/16/22  Yes [name], MD  triamcinolone (KENALOG) 0.025 % ointment Apply 1 Application topically 2 (two) times daily. Upper back, arms, legs 08/13/22  Yes [provider]  ciprofloxacin (CIPRO) 500 MG tablet Take 500 mg by mouth daily. Patient not taking: Reported on 10/11/2022 09/22/22   [provider]  fluconazole (DIFLUCAN) 100 MG tablet Take 100 mg by mouth once. Patient not taking: Reported on 10/11/2022    [provider]  fosfomycin (MONUROL) 3 g PACK Take 3 g by mouth once. Patient not taking: Reported on 10/11/2022  10/04/22   [provider]  hydrochlorothiazide (HYDRODIURIL) 12.5 MG tablet Take 12.5 mg by mouth daily. Patient not taking: Reported on 10/11/2022 08/16/22   [provider]  nitrofurantoin (MACRODANTIN) 100 MG capsule Take 100 mg by mouth every 12 (twelve) hours. Patient not taking: Reported on 10/11/2022 09/11/22   [provider]  nitrofurantoin, macrocrystal-monohydrate, (MACROBID) 100 MG capsule Take 1 capsule (100 mg total) by mouth every 12 (twelve) hours.  Patient not taking: Reported on 10/11/2022 08/13/22   Milderd Meager., MD  nystatin (MYCOSTATIN) 100000 UNIT/ML suspension Take 5 mLs by mouth 4 (four) times daily. Patient not taking: Reported on 10/11/2022 09/13/22   [provider]     Critical care time: na

## 2022-10-24 NOTE — Progress Notes (Signed)
[facility]                                                                              Barbara Nelson, is a 82 y.o. female, DOB - 19-Nov-1940, ZOX:[id number] Admit date - 10/11/2022    Outpatient Primary MD for the patient is [name], MD  LOS - 11  days  Chief Complaint  Patient presents with   Urinary Tract Infection       Brief summary   Patient is a 82 year old female, lives with daughter, PMH recurrent UTI since February with gradual physical decline, who presented with generalized weakness, poor oral intake, confusion, difficulty swallowing with choking and coughing. Admitted for aspiration pneumonia. Possible UTI. History of multiple episodes of pneumonia and UTI since February.    Patient admitted as above with recurrent episode of pneumonia with moderate hypoxia and respiratory distress as well as concurrent questionable UTI.  Patient completed antibiotic course but continues to have respiratory symptoms and hypoxia.  Speech evaluation improved that patient does have moderately profound dysphagia requiring nectar thick liquids which has limited her p.o. intake in both the caloric and free water aspects.  Given patient's poor nutritional status, ongoing aspiration, and concern for failure to thrive palliative care was consulted.   Assessment & Plan    Principal Problem:   Aspiration pneumonia (HCC), sepsis POA Acute respiratory failure with hypoxia, POA -Underlying history of chronic pulmonary blastomycosis, s/p lobectomy in 2014, chronic respiratory failure on 2 L O2 via [place], -Completed empiric antibiotics, wean O2 as tolerated.  Sepsis physiology resolved -SLP evaluation recommended dysphagia 1 diet with nectar thick liquids -Daughter stating abrupt change in her ability to tolerate PO's  "she was eating fine before admission", CT head negative for acute CVA or acute pathology. -Chest x-ray 6/4 showed pulmonary edema, bilateral pleural effusions,  increased on the right, increased retrocardiac airspace opacity atelectasis or pneumonia -Continue IV Lasix 20 mg twice daily, still showing 15.3 L net positive although doubt accuracy of the output -Will obtain CT chest tomorrow as recommended by pulmonology.  Active Problems: Odynophagia, oral candidiasis, ?  Pharyngeal and esophageal candidiasis -Currently on Diflucan, started on 5/28, Magic mouthwash -On dysphagia 1 pured diet with nectar thick liquids -Continue Carafate, Diflucan, Magic mouthwash, PPI  -Continue current management, appreciate GI further recommendations  Acute metabolic encephalopathy, failure to thrive, generalized debility -Per previous documentation, patient has been struggling with multiple episodes of PNA, UTI since February.  Has had multiple rounds of antibiotics with minimal improvement.  -Per family, overall had a much better day yesterday, tolerated diet and had snacks.  Patient was much more alert and oriented, had visitors late night.    Hyponatremia -Resolved  Hypokalemia -Continue daily replacement    Malnutrition of moderate degree, failure to thrive Etiology: chronic illness Signs/Symptoms: energy intake < 75% for > or equal to 1 month, percent weight loss (9.5% in 5 months) Percent weight loss: 9.5 % (in 5 months) Interventions: Refer to RD note for recommendations, Magic cup, Prostat, MVI Estimated body mass index is 32.27 kg/m as calculated from the following:   Height as of this encounter: 5\' 6"  (  1.676 m).   Weight as of this encounter: 90.7 kg. -Per family, had a better day yesterday and tolerated diet, ate snacks. -Per daughter at bedside, patient had mentioned to the family yesterday that she does not want any feeding tube or artificial feeding   Urinary retention -UA negative for UTI  -Continue Flomax for 7 days  Code Status: DNI DVT Prophylaxis:  enoxaparin (LOVENOX) injection 40 mg Start: 10/11/22 2200 SCDs Start: 10/11/22  1132   Level of Care: Level of care: Med-Surg Family Communication: Updated patient's daughter and sister at the bedside and daughter on the speaker phone today Disposition Plan:      Remains inpatient appropriate:      Procedures:    Consultants:   Palliative medicine GI Pulmonology Antimicrobials:   Anti-infectives (From admission, onward)    Start     Dose/Rate Route Frequency Ordered Stop   10/13/22 1400  fluconazole (DIFLUCAN) IVPB 200 mg       See Hyperspace for full Linked Orders Report.   200 mg 100 mL/hr over 60 Minutes Intravenous Daily 10/12/22 1138     10/13/22 1000  fluconazole (DIFLUCAN) IVPB 100 mg  Status:  Discontinued       See Hyperspace for full Linked Orders Report.   100 mg 50 mL/hr over 60 Minutes Intravenous Daily 10/12/22 1036 10/12/22 1138   10/12/22 1130  fluconazole (DIFLUCAN) IVPB 200 mg       See Hyperspace for full Linked Orders Report.   200 mg 100 mL/hr over 60 Minutes Intravenous  Once 10/12/22 1036 10/13/22 1114   10/12/22 1000  cefTRIAXone (ROCEPHIN) 2 g in sodium chloride 0.9 % 100 mL IVPB        2 g 200 mL/hr over 30 Minutes Intravenous Every 24 hours 10/11/22 1148 10/15/22 1327   10/12/22 1000  metroNIDAZOLE (FLAGYL) IVPB 500 mg        500 mg 100 mL/hr over 60 Minutes Intravenous Every 12 hours 10/11/22 1148 10/15/22 2255   10/11/22 1145  levofloxacin (LEVAQUIN) IVPB 500 mg  Status:  Discontinued        500 mg 100 mL/hr over 60 Minutes Intravenous Every 24 hours 10/11/22 1130 10/11/22 1146   10/11/22 0900  levofloxacin (LEVAQUIN) IVPB 750 mg        750 mg 100 mL/hr over 90 Minutes Intravenous  Once 10/11/22 0858 10/11/22 1127          Medications  (feeding supplement) PROSource Plus  30 mL Oral TID BM   acetaminophen  650 mg Oral TID   enoxaparin (LOVENOX) injection  40 mg Subcutaneous Q24H   furosemide  20 mg Intravenous BID   ketotifen  1 drop Both Eyes BID   magic mouthwash  5 mL Oral QID   metoprolol succinate  12.5  mg Oral Daily   multivitamin with minerals  1 tablet Oral Daily   pantoprazole (PROTONIX) IV  40 mg Intravenous Daily   polyethylene glycol  17 g Oral BID   potassium chloride  20 mEq Oral BID   tamsulosin  0.4 mg Oral Daily   thiamine (VITAMIN B1) injection  100 mg Intravenous Daily      Subjective:   Barbara Nelson was seen and examined today.  This morning, somnolent and did not interact.  However per daughter and the sister at the bedside, she was very interactive yesterday, alert and oriented, tolerated diet and showed a video of patient sipping water through the straw.  Last night slept minimally due  to visitors late night and frequent repositioning   Objective:   Vitals:   10/23/22 0614 10/23/22 1415 10/23/22 2222 10/24/22 0457  BP: (!) 143/69 (!) 141/77 133/77 (!) 147/77  Pulse: 79 81 93 77  Resp: 17 16 17 17   Temp: 98.5 F (36.9 C) 97.7 F (36.5 C) (!) 97.5 F (36.4 C) 98.1 F (36.7 C)  TempSrc: Oral  Oral Oral  SpO2: 98% 100% 100% 100%  Weight:      Height:        Intake/Output Summary (Last 24 hours) at 10/24/2022 1110 Last data filed at 10/24/2022 0600 Gross per 24 hour  Intake 1090 ml  Output 300 ml  Net 790 ml     Wt Readings from Last 3 Encounters:  10/11/22 90.7 kg  10/01/22 90.7 kg  08/19/22 92.1 kg    Physical Exam General: Somnolent and minimally interactive today, appears worn out Cardiovascular: S1 S2 clear, RRR.  Respiratory: Diminished yes at the bases, no wheezing Gastrointestinal: Soft, nontender, nondistended, NBS Ext: trace pedal edema bilaterally Neuro: unable to assess Psych: somnolent    Data Reviewed:  I have personally reviewed following labs    CBC Lab Results  Component Value Date   WBC 5.3 10/24/2022   RBC 3.50 (L) 10/24/2022   HGB 10.0 (L) 10/24/2022   HCT 29.7 (L) 10/24/2022   MCV 84.9 10/24/2022   MCH 28.6 10/24/2022   PLT 140 (L) 10/24/2022   MCHC 33.7 10/24/2022   RDW 17.6 (H) 10/24/2022   LYMPHSABS 1.1  10/11/2022   MONOABS 0.5 10/11/2022   EOSABS 0.1 10/11/2022   BASOSABS 0.0 10/11/2022     Last metabolic panel Lab Results  Component Value Date   NA 138 10/24/2022   K 3.0 (L) 10/24/2022   CL 99 10/24/2022   CO2 31 10/24/2022   BUN 9 10/24/2022   CREATININE 0.58 10/24/2022   GLUCOSE 96 10/24/2022   GFRNONAA >60 10/24/2022   GFRAA 57 (L) 03/03/2012   CALCIUM 8.2 (L) 10/24/2022   PHOS 2.5 07/10/2022   PROT 5.2 (L) 10/21/2022   ALBUMIN 1.5 (L) 10/21/2022   BILITOT 0.4 10/21/2022   ALKPHOS 168 (H) 10/21/2022   AST 36 10/21/2022   ALT 20 10/21/2022   ANIONGAP 8 10/24/2022    CBG (last 3)  No results for input(s): "GLUCAP" in the last 72 hours.    Coagulation Profile: No results for input(s): "INR", "PROTIME" in the last 168 hours.   Radiology Studies: I have personally reviewed the imaging studies  No results found.     Thad Ranger M.D. [facility] 10/24/2022, 11:10 AM  Available via Epic secure chat 7am-7pm After 7 pm, please refer to night coverage provider listed on amion.

## 2022-10-24 NOTE — Progress Notes (Signed)
Patient drowsy and visibly taxed from being up in recliner this morning for ~2hr followed by CT this afternoon. She is repositioned side-to-side at this time and peri care done. Discussed with family and they believe it would be better to place bed into chair position for supper. This is done and patient tolerates well. Eyes open and slight smile at conclusion of care. Haydee Salter, RN 10/24/22 5:31 PM

## 2022-10-24 NOTE — Plan of Care (Signed)
Problem: Clinical Measurements: Goal: Ability to maintain clinical measurements within normal limits will improve Outcome: Progressing   Problem: Pain Managment: Goal: General experience of comfort will improve Outcome: Progressing   Problem: Safety: Goal: Ability to remain free from injury will improve Outcome: Progressing   Haydee Salter, RN 10/24/22 7:58 AM

## 2022-10-24 NOTE — Progress Notes (Signed)
Staff x 3 assisted patient onto side of bed. She was unable to independently maintain sitting position. Assisted back into bed then lifted into recliner chair via hoyer. Family at bedside and verbalize understanding that max supports needed for patient safety at this time. Dr. Isidoro Donning updated. Haydee Salter, RN 10/24/22 8:49 AM

## 2022-10-24 NOTE — Progress Notes (Addendum)
Daily Progress Note   Patient Name: Barbara Nelson       Date: 10/24/2022 DOB: June 02, 1940  Age: 82 y.o. MRN#: [id number] Attending Physician: Cathren Harsh, MD Primary Care Physician: [name], MD Admit Date: 10/11/2022 Length of Stay: 11 days  Reason for Consultation/Follow-up: Establishing goals of care  Subjective:   CC: Patient sitting in chair at bedside. Multiple family members at bedside. Following up regarding complex medical decision making.   Subjective:  Reviewed EMR prior to presenting to bedside.   When presenting to bedside, multiple family members present visiting with patient.  Patient seen sitting up in bedside chair.  Patient appears to have been moved there via [facility] lift.  Patient alert and interactive today.  Patient at times slow to answer questions though will appropriately answer with time.  Family described how patient was eating better yesterday and this morning.  Patient more interactive and they are hopeful she can have more time out of the bed.  Family very happy with patient's continued progress.  Patient denied any symptoms of concern.  Spent time providing emotional support via active listening.  No questions noted directed at palliative medicine team at this time.  Noted our team would be available if needed.  Patient and family for allowing me to visit with him today.  Review of Systems Patient denied any symptoms of concern. Objective:   Vital Signs:  BP (!) 147/77 (BP Location: Right Arm)   Pulse 77   Temp 98.1 F (36.7 C) (Oral)   Resp 17   Ht 5\' 6"  (1.676 m)   Wt 90.7 kg   LMP  (LMP Unknown)   SpO2 100%   BMI 32.27 kg/m   Physical Exam: General: NAD, laying in bed, more awake and interactive today, chronically ill-appearing Eyes: No drainage noted HENT: moist mucous membranes Cardiovascular: RRR, edema in LE b/l Respiratory: no increase Neuro: will interact with straight forward questions today   Imaging:  I personally  reviewed recent imaging.   Assessment & Plan:   Assessment: Patient is an 82 year old female with a past medical history of aspiration pneumonia, constipation, generalized deconditioning, degenerative joint disease, and recurrent urinary tract infections who was admitted on 10/11/2022 for management worsening malaise and weakness for the past few weeks.  Since admission patient has received management for aspiration pneumonia and sepsis secondary to this, odynophagia with question of candidiasis, and electrolyte abnormalities.  Hospitalization complicated by poor oral intake, encephalopathy, and inability to consistently along with work with therapy.  Palliative medicine team consulted to assist with complex medical decision making.  Recommendations/Plan: # Complex medical decision making/goals of care:  - Have continued to discuss care with patient and multiple family members at bedside.  Family continues to advocate to "fix the fixable".  Family very hopeful that patient will be able to return to her prior functional status with the time.  Patient slowly becoming more interactive and eating more.   -  Code Status: DNR  # Symptom management:  -Poor appetite Slowly improving as per GI interventions.   # Psychosocial Support:  -Multiple family members at bedside for discussion today  # Discharge Planning: Patient had previously been accepted to rehab. Awaiting medical clearance.   -PMT has already recommended outpatient palliative medicine follow up with rehab.   Discussed with: patient/patient's family at bedside  Thank you for allowing the palliative care team to participate in the care Elba Barman.  Alvester Morin, DO Palliative Care Provider PMT # [phone]  If patient remains symptomatic despite maximum doses, please call PMT at [phone] between 0700 and 1900. Outside of these hours, please call attending, as PMT does not have night coverage.  This provider spent a total  of 35 minutes providing patient's care.  Includes review of EMR, discussing care with other staff members involved in patient's medical care, obtaining relevant history and information from patient and/or patient's family, and personal review of imaging and lab work. Greater than 50% of the time was spent counseling and coordinating care related to the above assessment and plan.    *Please note that this is a verbal dictation therefore any spelling or grammatical errors are due to the "[employer]" system interpretation.

## 2022-10-25 DIAGNOSIS — R34 Anuria and oliguria: Secondary | ICD-10-CM | POA: Diagnosis not present

## 2022-10-25 DIAGNOSIS — R29898 Other symptoms and signs involving the musculoskeletal system: Secondary | ICD-10-CM | POA: Diagnosis not present

## 2022-10-25 DIAGNOSIS — R410 Disorientation, unspecified: Secondary | ICD-10-CM | POA: Diagnosis not present

## 2022-10-25 DIAGNOSIS — J189 Pneumonia, unspecified organism: Secondary | ICD-10-CM | POA: Diagnosis not present

## 2022-10-25 LAB — BASIC METABOLIC PANEL
Anion gap: 11 (ref 5–15)
BUN: 9 mg/dL (ref 8–23)
CO2: 34 mmol/L — ABNORMAL HIGH (ref 22–32)
Calcium: 8.4 mg/dL — ABNORMAL LOW (ref 8.9–10.3)
Chloride: 96 mmol/L — ABNORMAL LOW (ref 98–111)
Creatinine, Ser: 0.63 mg/dL (ref 0.44–1.00)
GFR, Estimated: >60 mL/min (ref >60)
Glucose, Bld: 88 mg/dL (ref 70–99)
Potassium: 2.8 mmol/L — ABNORMAL LOW (ref 3.5–5.1)
Sodium: 141 mmol/L (ref 135–145)

## 2022-10-25 LAB — CBC
HCT: 35.2 % — ABNORMAL LOW (ref 36.0–46.0)
Hemoglobin: 11.7 g/dL — ABNORMAL LOW (ref 12.0–15.0)
MCH: 28.5 pg (ref 26.0–34.0)
MCHC: 33.2 g/dL (ref 30.0–36.0)
MCV: 85.6 fL (ref 80.0–100.0)
Platelets: 173 10*3/uL (ref 150–400)
RBC: 4.11 MIL/uL (ref 3.87–5.11)
RDW: 17.8 % — ABNORMAL HIGH (ref 11.5–15.5)
WBC: 4.9 10*3/uL (ref 4.0–10.5)
nRBC: 0 % (ref 0.0–0.2)

## 2022-10-25 LAB — MAGNESIUM: Magnesium: 1.8 mg/dL (ref 1.7–2.4)

## 2022-10-25 MED ORDER — HYDROCORTISONE ACETATE 25 MG RE SUPP
25.0000 mg | Freq: Two times a day (BID) | RECTAL | Status: DC
  Administered 2022-10-25 – 2022-10-30 (×7): 25 mg via RECTAL
  Filled 2022-10-25 (×11): qty 1

## 2022-10-25 MED ORDER — PHENYLEPHRINE-MINERAL OIL-PET 0.25-14-74.9 % RE OINT
1.0000 | TOPICAL_OINTMENT | Freq: Two times a day (BID) | RECTAL | Status: DC | PRN
  Administered 2022-10-25 (×2): 1 via RECTAL
  Filled 2022-10-25: qty 57

## 2022-10-25 MED ORDER — POTASSIUM CHLORIDE 10 MEQ/100ML IV SOLN
10.0000 meq | INTRAVENOUS | Status: AC
  Administered 2022-10-25 (×3): 10 meq via INTRAVENOUS
  Filled 2022-10-25 (×3): qty 100

## 2022-10-25 NOTE — Progress Notes (Signed)
Mobility Specialist - Progress Note   10/25/22 1500  Mobility  Activity Transferred from bed to chair  Level of Assistance Dependent, patient does less than 25%  Assistive Device MaxiMove  Activity Response Tolerated well  Mobility Referral Yes  $Mobility charge 1 Mobility  Mobility Specialist Start Time (ACUTE ONLY) 1450  Mobility Specialist Stop Time (ACUTE ONLY) 1500  Mobility Specialist Time Calculation (min) (ACUTE ONLY) 10 min   Pt received in bed and agreed to transfer, pt in chair with all needs met and staff in room.  Marilynne Halsted Mobility Specialist

## 2022-10-25 NOTE — Plan of Care (Signed)
Problem: Education: Goal: Knowledge of General Education information will improve Description: Including pain rating scale, medication(s)/side effects and non-pharmacologic comfort measures Outcome: Progressing   Problem: Clinical Measurements: Goal: Ability to maintain clinical measurements within normal limits will improve Outcome: Progressing   Problem: Nutrition: Goal: Adequate nutrition will be maintained Outcome: Progressing   Problem: Coping: Goal: Level of anxiety will decrease Outcome: Progressing   Haydee Salter, RN 10/25/22 7:57 AM

## 2022-10-25 NOTE — Progress Notes (Signed)
Mobility Specialist - Progress Note   10/25/22 0949  Mobility  Activity Transferred from bed to chair  Level of Assistance Other (Comment) (lift)  Assistive Device MaxiMove  Range of Motion/Exercises Passive  Activity Response Tolerated well  Mobility Referral Yes  $Mobility charge 1 Mobility  Mobility Specialist Start Time (ACUTE ONLY) [id number]  Mobility Specialist Stop Time (ACUTE ONLY) 0945  Mobility Specialist Time Calculation (min) (ACUTE ONLY) 20 min   Pt received in bed and agreed to transfer via lift. Pt was Max A for bed mobility to place lift pad. Pt had no c/o throughout session and was left in chair with all needs met, RN and family in room. Will return to assist back to bed.  Marilynne Halsted Mobility Specialist

## 2022-10-25 NOTE — Progress Notes (Signed)
[facility]                                                                              Barbara Nelson, is a 82 y.o. female, DOB - [birth year], ZOX:[id number] Admit date - 10/11/2022    Outpatient Primary MD for the patient is Barbara Dimitri, MD  LOS - 12  days  Chief Complaint  Patient presents with   Urinary Tract Infection       Brief summary   Patient is a 82 year old female, lives with daughter, PMH recurrent UTI since February with gradual physical decline, who presented with generalized weakness, poor oral intake, confusion, difficulty swallowing with choking and coughing. Admitted for aspiration pneumonia. Possible UTI. History of multiple episodes of pneumonia and UTI since February.    Patient admitted as above with recurrent episode of pneumonia with moderate hypoxia and respiratory distress as well as concurrent questionable UTI.  Patient completed antibiotic course but continues to have respiratory symptoms and hypoxia.  Speech evaluation improved that patient does have moderately profound dysphagia requiring nectar thick liquids which has limited her p.o. intake in both the caloric and free water aspects.  Given patient's poor nutritional status, ongoing aspiration, and concern for failure to thrive palliative care was consulted.   Assessment & Plan    Principal Problem:   Aspiration pneumonia (HCC), sepsis POA Acute respiratory failure with hypoxia, POA -Underlying history of chronic pulmonary blastomycosis, s/p lobectomy in 2014, chronic respiratory failure on 2 L O2 via [place], - Sepsis physiology resolved -SLP evaluation recommended dysphagia 1 diet with nectar thick liquids -Daughter stating abrupt change in her ability to tolerate PO's  "she was eating fine before admission", CT head negative for acute CVA or acute pathology. -Chest x-ray 6/4 showed pulmonary edema, bilateral pleural effusions, increased on the right, increased retrocardiac airspace  opacity atelectasis or pneumonia -Continue IV Lasix 20 mg twice daily -CT chest 6/8 showed interval development of posterior left lower lobe collapse/consolidation, patchy airspace disease in the right lung base, new left pleural effusion with small to moderate right effusion with loculation. - Dr Thora Lance discussed options with the family including IR thoracentesis or pigtail or empirically treating parapneumonic effusion for 4 weeks antibiotic course.  Family leaning towards prolonged antibiotic course.  IV Rocephin was started and recommended discharge on cefdinir suspension to complete 4-week course of antibiotics.  Outpatient follow-up with pulmonology in 4 to 5 weeks to repeat CXR -Wean O2 as tolerated  Active Problems: Odynophagia, oral candidiasis, ?  Pharyngeal and esophageal candidiasis -Currently on Diflucan, started on 5/28, Magic mouthwash -On dysphagia 1 pured diet with nectar thick liquids -Continue Carafate, Diflucan, Magic mouthwash, PPI.  GI signed off. -Repeat SLP evaluation, patient is much more alert and oriented, no complaints of odynophagia, hence likely be able to advance diet  Acute metabolic encephalopathy, failure to thrive, generalized debility -Per previous documentation, patient has been struggling with multiple episodes of PNA, UTI since February.  Has had multiple rounds of antibiotics with minimal improvement.  -Improving, alert and oriented, family at the bedside, appears close to her baseline  Hyponatremia -Resolved  Hypokalemia -Continue daily replacement,  -also  added IV replacement, follow magnesium    Malnutrition of moderate degree, failure to thrive Etiology: chronic illness Signs/Symptoms: energy intake < 75% for > or equal to 1 month, percent weight loss (9.5% in 5 months) Percent weight loss: 9.5 % (in 5 months) Interventions: Refer to RD note for recommendations, Magic cup, Prostat, MVI Estimated body mass index is 32.27 kg/m as calculated from  the following:   Height as of this encounter: 5\' 6"  (1.676 m).   Weight as of this encounter: 90.7 kg.  -Per daughter, patient is now eating better and tolerating current diet, much more alert and oriented, will repeat SLP evaluation   Urinary retention -UA negative for UTI  -Continue Flomax for 7 days  Code Status: DNI DVT Prophylaxis:  enoxaparin (LOVENOX) injection 40 mg Start: 10/11/22 2200 SCDs Start: 10/11/22 1132   Level of Care: Level of care: Med-Surg Family Communication: Updated patient's daughter and relative at the bedside and daughter on the speaker phone today Disposition Plan:      Remains inpatient appropriate:      Procedures:    Consultants:   Palliative medicine GI Pulmonology Antimicrobials:   Anti-infectives (From admission, onward)    Start     Dose/Rate Route Frequency Ordered Stop   10/24/22 1600  cefTRIAXone (ROCEPHIN) 2 g in sodium chloride 0.9 % 100 mL IVPB        2 g 200 mL/hr over 30 Minutes Intravenous Every 24 hours 10/24/22 1500     10/13/22 1400  fluconazole (DIFLUCAN) IVPB 200 mg       See Hyperspace for full Linked Orders Report.   200 mg 100 mL/hr over 60 Minutes Intravenous Daily 10/12/22 1138     10/13/22 1000  fluconazole (DIFLUCAN) IVPB 100 mg  Status:  Discontinued       See Hyperspace for full Linked Orders Report.   100 mg 50 mL/hr over 60 Minutes Intravenous Daily 10/12/22 1036 10/12/22 1138   10/12/22 1130  fluconazole (DIFLUCAN) IVPB 200 mg       See Hyperspace for full Linked Orders Report.   200 mg 100 mL/hr over 60 Minutes Intravenous  Once 10/12/22 1036 10/13/22 1114   10/12/22 1000  cefTRIAXone (ROCEPHIN) 2 g in sodium chloride 0.9 % 100 mL IVPB        2 g 200 mL/hr over 30 Minutes Intravenous Every 24 hours 10/11/22 1148 10/15/22 1327   10/12/22 1000  metroNIDAZOLE (FLAGYL) IVPB 500 mg        500 mg 100 mL/hr over 60 Minutes Intravenous Every 12 hours 10/11/22 1148 10/15/22 2255   10/11/22 1145  levofloxacin  (LEVAQUIN) IVPB 500 mg  Status:  Discontinued        500 mg 100 mL/hr over 60 Minutes Intravenous Every 24 hours 10/11/22 1130 10/11/22 1146   10/11/22 0900  levofloxacin (LEVAQUIN) IVPB 750 mg        750 mg 100 mL/hr over 90 Minutes Intravenous  Once 10/11/22 0858 10/11/22 1127          Medications  (feeding supplement) PROSource Plus  30 mL Oral TID BM   acetaminophen  650 mg Oral TID   enoxaparin (LOVENOX) injection  40 mg Subcutaneous Q24H   furosemide  20 mg Intravenous BID   hydrocortisone  25 mg Rectal BID   ketotifen  1 drop Both Eyes BID   magic mouthwash  5 mL Oral QID   metoprolol succinate  12.5 mg Oral Daily   multivitamin with minerals  1 tablet Oral Daily   pantoprazole (PROTONIX) IV  40 mg Intravenous Daily   polyethylene glycol  17 g Oral BID   potassium chloride  20 mEq Oral BID   sucralfate  1 g Oral TID WC & HS   tamsulosin  0.4 mg Oral Daily   thiamine (VITAMIN B1) injection  100 mg Intravenous Daily      Subjective:   Alyss Granato was seen and examined today.  Patient much more alert and oriented, smiling and interacting today.  Patient's daughter and another relative at the bedside.  Patient denies any pain, states that not feel any sore throat today.  Did sit in the chair yesterday.  Family will work on LE mobility while in bed.  Objective:   Vitals:   10/24/22 0457 10/24/22 1341 10/24/22 2156 10/25/22 0623  BP: (!) 147/77 (!) 144/84 (!) 130/97 (!) 143/80  Pulse: 77 81 98 78  Resp: 17 18 20 19   Temp: 98.1 F (36.7 C) 97.6 F (36.4 C) 99 F (37.2 C) 98 F (36.7 C)  TempSrc: Oral Oral  Oral  SpO2: 100% 97% 96% 99%  Weight:      Height:        Intake/Output Summary (Last 24 hours) at 10/25/2022 1329 Last data filed at 10/25/2022 0600 Gross per 24 hour  Intake 380 ml  Output --  Net 380 ml     Wt Readings from Last 3 Encounters:  10/11/22 90.7 kg  10/01/22 90.7 kg  08/19/22 92.1 kg   Physical Exam General: Alert and oriented x  3, NAD Cardiovascular: S1 S2 clear, RRR.  Respiratory: Diminished BS at the bases, no wheezing Gastrointestinal: Soft, nontender, nondistended, NBS Ext: trace pedal edema bilaterally Neuro: LE generalized weakness, b/l UE 5/ 5 strength Psych: Normal affect, pleasant and smiling    Data Reviewed:  I have personally reviewed following labs    CBC Lab Results  Component Value Date   WBC 4.9 10/25/2022   RBC 4.11 10/25/2022   HGB 11.7 (L) 10/25/2022   HCT 35.2 (L) 10/25/2022   MCV 85.6 10/25/2022   MCH 28.5 10/25/2022   PLT 173 10/25/2022   MCHC 33.2 10/25/2022   RDW 17.8 (H) 10/25/2022   LYMPHSABS 1.1 10/11/2022   MONOABS 0.5 10/11/2022   EOSABS 0.1 10/11/2022   BASOSABS 0.0 10/11/2022     Last metabolic panel Lab Results  Component Value Date   NA 141 10/25/2022   K 2.8 (L) 10/25/2022   CL 96 (L) 10/25/2022   CO2 34 (H) 10/25/2022   BUN 9 10/25/2022   CREATININE 0.63 10/25/2022   GLUCOSE 88 10/25/2022   GFRNONAA >60 10/25/2022   GFRAA 57 (L) 03/03/2012   CALCIUM 8.4 (L) 10/25/2022   PHOS 2.5 07/10/2022   PROT 5.2 (L) 10/21/2022   ALBUMIN 1.5 (L) 10/21/2022   BILITOT 0.4 10/21/2022   ALKPHOS 168 (H) 10/21/2022   AST 36 10/21/2022   ALT 20 10/21/2022   ANIONGAP 11 10/25/2022    CBG (last 3)  No results for input(s): "GLUCAP" in the last 72 hours.    Coagulation Profile: No results for input(s): "INR", "PROTIME" in the last 168 hours.   Radiology Studies: I have personally reviewed the imaging studies  CT CHEST WO CONTRAST  Result Date: 10/24/2022 CLINICAL DATA:  History of blastomycosis. Status post right middle lobectomy. Chronic dyspnea. EXAM: CT CHEST WITHOUT CONTRAST TECHNIQUE: Multidetector CT imaging of the chest was performed following the standard protocol without IV contrast. RADIATION  DOSE REDUCTION: This exam was performed according to the departmental dose-optimization program which includes automated exposure control, adjustment of the mA  and/or kV according to patient size and/or use of iterative reconstruction technique. COMPARISON:  06/18/2022 FINDINGS: Cardiovascular: Heart is enlarged. No substantial pericardial effusion. Mild atherosclerotic calcification is noted in the wall of the thoracic aorta. Enlargement of the pulmonary outflow tract/main pulmonary arteries suggests pulmonary arterial hypertension. Mediastinum/Nodes: Similar mediastinal lymphadenopathy. Index prevascular node measuring 13 mm short axis on 53/2 was 14 mm when remeasured in a similar fashion on the prior study. Borderline subcarinal lymphadenopathy evident. No evidence for gross hilar lymphadenopathy although assessment is limited by the lack of intravenous contrast on the current study. The esophagus has normal imaging features. There is no axillary lymphadenopathy. Lungs/Pleura: Interlobular septal thickening noted in the upper lobes bilaterally. Surgical changes are noted in the right lung. Interval development of posterior left lower lobe collapse/consolidation. Perifissural collapse/consolidation noted along the right major fissure inferiorly. There is some dependent atelectasis in the right lung base. Small left pleural effusion is new in the interval. Loculated right pleural effusion is new since prior. Upper Abdomen: Visualized portion of the upper abdomen is unremarkable. Musculoskeletal: No worrisome lytic or sclerotic osseous abnormality. IMPRESSION: 1. Interval development of posterior left lower lobe collapse/consolidation for pneumonia. There is patchy airspace disease in the pendant right lung base. 2. New small free-flowing left pleural effusion with associated new small to moderate right effusion with apparent loculation. Concerning. 3. Similar mediastinal lymphadenopathy. 4. Enlargement of the pulmonary outflow tract/main pulmonary arteries suggests pulmonary arterial hypertension. 5.  Aortic Atherosclerosis (ICD10-I70.0). Electronically Signed   By: Kennith Center M.D.   On: 10/24/2022 14:29       Celvin Taney M.D. [facility] 10/25/2022, 1:29 PM  Available via Epic secure chat 7am-7pm After 7 pm, please refer to night coverage provider listed on amion.

## 2022-10-25 NOTE — Progress Notes (Signed)
Mobility Specialist - Progress Note   10/25/22 1202  Mobility  Activity Transferred from chair to bed  Level of Assistance Other (Comment) (lift)  Assistive Device MaxiMove  Activity Response Tolerated well  Mobility Referral Yes  $Mobility charge 1 Mobility  Mobility Specialist Start Time (ACUTE ONLY) 1150  Mobility Specialist Stop Time (ACUTE ONLY) 1203  Mobility Specialist Time Calculation (min) (ACUTE ONLY) 13 min   Pt received in chair and agreed to transfer. Pt returned to bed with all needs met.  Marilynne Halsted Mobility Specialist

## 2022-10-26 ENCOUNTER — Inpatient Hospital Stay (HOSPITAL_COMMUNITY): Payer: Medicare Other

## 2022-10-26 DIAGNOSIS — R410 Disorientation, unspecified: Secondary | ICD-10-CM | POA: Diagnosis not present

## 2022-10-26 DIAGNOSIS — J189 Pneumonia, unspecified organism: Secondary | ICD-10-CM | POA: Diagnosis not present

## 2022-10-26 DIAGNOSIS — R29898 Other symptoms and signs involving the musculoskeletal system: Secondary | ICD-10-CM | POA: Diagnosis not present

## 2022-10-26 DIAGNOSIS — R34 Anuria and oliguria: Secondary | ICD-10-CM | POA: Diagnosis not present

## 2022-10-26 LAB — CBC
HCT: 32.6 % — ABNORMAL LOW (ref 36.0–46.0)
Hemoglobin: 11 g/dL — ABNORMAL LOW (ref 12.0–15.0)
MCH: 28.4 pg (ref 26.0–34.0)
MCHC: 33.7 g/dL (ref 30.0–36.0)
MCV: 84 fL (ref 80.0–100.0)
Platelets: 177 10*3/uL (ref 150–400)
RBC: 3.88 MIL/uL (ref 3.87–5.11)
RDW: 17.7 % — ABNORMAL HIGH (ref 11.5–15.5)
WBC: 4.9 10*3/uL (ref 4.0–10.5)
nRBC: 0 % (ref 0.0–0.2)

## 2022-10-26 LAB — BASIC METABOLIC PANEL
Anion gap: 7 (ref 5–15)
BUN: 11 mg/dL (ref 8–23)
CO2: 33 mmol/L — ABNORMAL HIGH (ref 22–32)
Calcium: 8.3 mg/dL — ABNORMAL LOW (ref 8.9–10.3)
Chloride: 101 mmol/L (ref 98–111)
Creatinine, Ser: 0.63 mg/dL (ref 0.44–1.00)
GFR, Estimated: >60 mL/min (ref >60)
Glucose, Bld: 100 mg/dL — ABNORMAL HIGH (ref 70–99)
Potassium: 3.1 mmol/L — ABNORMAL LOW (ref 3.5–5.1)
Sodium: 141 mmol/L (ref 135–145)

## 2022-10-26 LAB — MAGNESIUM: Magnesium: 1.8 mg/dL (ref 1.7–2.4)

## 2022-10-26 MED ORDER — THIAMINE MONONITRATE 100 MG PO TABS
100.0000 mg | ORAL_TABLET | Freq: Every day | ORAL | Status: DC
  Administered 2022-10-27 – 2022-10-30 (×3): 100 mg via ORAL
  Filled 2022-10-26 (×4): qty 1

## 2022-10-26 MED ORDER — POTASSIUM CHLORIDE 10 MEQ/100ML IV SOLN
10.0000 meq | INTRAVENOUS | Status: AC
  Administered 2022-10-26 (×3): 10 meq via INTRAVENOUS
  Filled 2022-10-26: qty 100

## 2022-10-26 NOTE — Progress Notes (Signed)
[facility]                                                                              Barbara Nelson, is a 82 y.o. female, DOB - February 20, 1941, UJW:[id number] Admit date - 10/11/2022    Outpatient Primary MD for the patient is [name], MD  LOS - 13  days  Chief Complaint  Patient presents with   Urinary Tract Infection       Brief summary   Patient is a 82 year old female, lives with daughter, PMH recurrent UTI since February with gradual physical decline, who presented with generalized weakness, poor oral intake, confusion, difficulty swallowing with choking and coughing. Admitted for aspiration pneumonia. Possible UTI. History of multiple episodes of pneumonia and UTI since February.    Patient admitted as above with recurrent episode of pneumonia with moderate hypoxia and respiratory distress as well as concurrent questionable UTI.  Patient completed antibiotic course but continues to have respiratory symptoms and hypoxia.  Speech evaluation improved that patient does have moderately profound dysphagia requiring nectar thick liquids which has limited her p.o. intake in both the caloric and free water aspects.  Given patient's poor nutritional status, ongoing aspiration, and concern for failure to thrive palliative care was consulted.   Assessment & Plan    Principal Problem:   Aspiration pneumonia (HCC), sepsis POA Acute respiratory failure with hypoxia, POA -Underlying history of chronic pulmonary blastomycosis, s/p lobectomy in 2014, chronic respiratory failure on 2 L O2 via [place], - Sepsis physiology resolved -SLP evaluation recommended dysphagia 1 diet with nectar thick liquids -Daughter stating abrupt change in her ability to tolerate PO's  "she was eating fine before admission", CT head negative for acute CVA or acute pathology. -Chest x-ray 6/4 showed pulmonary edema, bilateral pleural effusions, increased on the right, increased retrocardiac airspace  opacity atelectasis or pneumonia -CT chest 6/8 showed interval development of posterior left lower lobe collapse/consolidation, patchy airspace disease in the right lung base, new left pleural effusion with small to moderate right effusion with loculation. - Dr Thora Lance discussed options with the family including IR thoracentesis or pigtail or empirically treating parapneumonic effusion for 4 weeks antibiotic course.  Family leaning towards prolonged antibiotic course.  IV Rocephin was started and recommended discharge on cefdinir suspension to complete 4-week course of antibiotics.  Outpatient follow-up with pulmonology in 4 to 5 weeks to repeat CXR -Continue IV Lasix today, will transition to oral Lasix tomorrow -Wean O2 as tolerated  Active Problems: Odynophagia, oral candidiasis, ?  Pharyngeal and esophageal candidiasis -Currently on Diflucan, started on 5/28, Magic mouthwash -On dysphagia 1 pured diet with nectar thick liquids -Continue Carafate, Diflucan, Magic mouthwash, PPI.  GI signed off. -Repeat SLP evaluation today  Acute metabolic encephalopathy, failure to thrive, generalized debility -Per previous documentation, patient has been struggling with multiple episodes of PNA, UTI since February.  Has had multiple rounds of antibiotics with minimal improvement.  -Alert and oriented, fairly close to baseline, family at the bedside  Hyponatremia -Resolved  Hypokalemia -Currently on daily replacement    Malnutrition of moderate degree, failure to thrive Etiology: chronic illness Signs/Symptoms: energy intake <  75% for > or equal to 1 month, percent weight loss (9.5% in 5 months) Percent weight loss: 9.5 % (in 5 months) Interventions: Refer to RD note for recommendations, Magic cup, Prostat, MVI Estimated body mass index is 32.27 kg/m as calculated from the following:   Height as of this encounter: 5\' 6"  (1.676 m).   Weight as of this encounter: 90.7 kg.  -She is eating better now,  hopeful for advancing diet   Urinary retention -UA negative for UTI  -Continue Flomax for 7 days  Code Status: DNI DVT Prophylaxis:  enoxaparin (LOVENOX) injection 40 mg Start: 10/11/22 2200 SCDs Start: 10/11/22 1132   Level of Care: Level of care: Med-Surg Family Communication: Updated patient's daughter and brother at the bedside, daughter on the speaker phone today Disposition Plan:      Remains inpatient appropriate:      Procedures:    Consultants:   Palliative medicine GI Pulmonology Antimicrobials:   Anti-infectives (From admission, onward)    Start     Dose/Rate Route Frequency Ordered Stop   10/24/22 1600  cefTRIAXone (ROCEPHIN) 2 g in sodium chloride 0.9 % 100 mL IVPB        2 g 200 mL/hr over 30 Minutes Intravenous Every 24 hours 10/24/22 1500     10/13/22 1400  fluconazole (DIFLUCAN) IVPB 200 mg       See Hyperspace for full Linked Orders Report.   200 mg 100 mL/hr over 60 Minutes Intravenous Daily 10/12/22 1138 10/25/22 1621   10/13/22 1000  fluconazole (DIFLUCAN) IVPB 100 mg  Status:  Discontinued       See Hyperspace for full Linked Orders Report.   100 mg 50 mL/hr over 60 Minutes Intravenous Daily 10/12/22 1036 10/12/22 1138   10/12/22 1130  fluconazole (DIFLUCAN) IVPB 200 mg       See Hyperspace for full Linked Orders Report.   200 mg 100 mL/hr over 60 Minutes Intravenous  Once 10/12/22 1036 10/13/22 1114   10/12/22 1000  cefTRIAXone (ROCEPHIN) 2 g in sodium chloride 0.9 % 100 mL IVPB        2 g 200 mL/hr over 30 Minutes Intravenous Every 24 hours 10/11/22 1148 10/15/22 1327   10/12/22 1000  metroNIDAZOLE (FLAGYL) IVPB 500 mg        500 mg 100 mL/hr over 60 Minutes Intravenous Every 12 hours 10/11/22 1148 10/15/22 2255   10/11/22 1145  levofloxacin (LEVAQUIN) IVPB 500 mg  Status:  Discontinued        500 mg 100 mL/hr over 60 Minutes Intravenous Every 24 hours 10/11/22 1130 10/11/22 1146   10/11/22 0900  levofloxacin (LEVAQUIN) IVPB 750 mg         750 mg 100 mL/hr over 90 Minutes Intravenous  Once 10/11/22 0858 10/11/22 1127          Medications  (feeding supplement) PROSource Plus  30 mL Oral TID BM   acetaminophen  650 mg Oral TID   enoxaparin (LOVENOX) injection  40 mg Subcutaneous Q24H   furosemide  20 mg Intravenous BID   hydrocortisone  25 mg Rectal BID   ketotifen  1 drop Both Eyes BID   magic mouthwash  5 mL Oral QID   metoprolol succinate  12.5 mg Oral Daily   multivitamin with minerals  1 tablet Oral Daily   pantoprazole (PROTONIX) IV  40 mg Intravenous Daily   polyethylene glycol  17 g Oral BID   potassium chloride  20 mEq Oral BID  sucralfate  1 g Oral TID WC & HS   tamsulosin  0.4 mg Oral Daily   [START ON 10/27/2022] thiamine  100 mg Oral Daily      Subjective:   Barbara Nelson was seen and examined today.  Alert and oriented, following commands, appears close to her baseline.  Per family at the bedside, has been tolerating current to dysphagia 1 diet without difficulty.  No sore throat or pain per the patient.  Objective:   Vitals:   10/25/22 0623 10/25/22 2220 10/25/22 2224 10/26/22 0608  BP: (!) 143/80  (!) 149/96 (!) 142/84  Pulse: 78 96 99 74  Resp: 19  19 18   Temp: 98 F (36.7 C)  98.6 F (37 C) 98.6 F (37 C)  TempSrc: Oral  Oral   SpO2: 99%  95% 100%  Weight:      Height:        Intake/Output Summary (Last 24 hours) at 10/26/2022 1308 Last data filed at 10/26/2022 1021 Gross per 24 hour  Intake 611.66 ml  Output --  Net 611.66 ml     Wt Readings from Last 3 Encounters:  10/11/22 90.7 kg  10/01/22 90.7 kg  08/19/22 92.1 kg   Physical Exam General: Alert and oriented x 3, NAD, pleasant Cardiovascular: S1 S2 clear, RRR.  Respiratory: CTAB, no wheezing Gastrointestinal: Soft, nontender, nondistended, NBS Ext: no pedal edema bilaterally Neuro LE weakness, upper extremities 5/5 Psych: Normal affect     Data Reviewed:  I have personally reviewed following labs     CBC Lab Results  Component Value Date   WBC 4.9 10/26/2022   RBC 3.88 10/26/2022   HGB 11.0 (L) 10/26/2022   HCT 32.6 (L) 10/26/2022   MCV 84.0 10/26/2022   MCH 28.4 10/26/2022   PLT 177 10/26/2022   MCHC 33.7 10/26/2022   RDW 17.7 (H) 10/26/2022   LYMPHSABS 1.1 10/11/2022   MONOABS 0.5 10/11/2022   EOSABS 0.1 10/11/2022   BASOSABS 0.0 10/11/2022     Last metabolic panel Lab Results  Component Value Date   NA 141 10/26/2022   K 3.1 (L) 10/26/2022   CL 101 10/26/2022   CO2 33 (H) 10/26/2022   BUN 11 10/26/2022   CREATININE 0.63 10/26/2022   GLUCOSE 100 (H) 10/26/2022   GFRNONAA >60 10/26/2022   GFRAA 57 (L) 03/03/2012   CALCIUM 8.3 (L) 10/26/2022   PHOS 2.5 07/10/2022   PROT 5.2 (L) 10/21/2022   ALBUMIN 1.5 (L) 10/21/2022   BILITOT 0.4 10/21/2022   ALKPHOS 168 (H) 10/21/2022   AST 36 10/21/2022   ALT 20 10/21/2022   ANIONGAP 7 10/26/2022    CBG (last 3)  No results for input(s): "GLUCAP" in the last 72 hours.    Coagulation Profile: No results for input(s): "INR", "PROTIME" in the last 168 hours.   Radiology Studies: I have personally reviewed the imaging studies  No results found.     Thad Ranger M.D. [facility] 10/26/2022, 1:08 PM  Available via Epic secure chat 7am-7pm After 7 pm, please refer to night coverage provider listed on amion.

## 2022-10-26 NOTE — Progress Notes (Signed)
Speech Language Pathology Treatment: Dysphagia  Patient Details Name: Barbara Nelson MRN: [id number] DOB: 10-05-1940 Today's Date: 10/26/2022 Time: 1435-1450 SLP Time Calculation (min) (ACUTE ONLY): 15 min  Assessment / Plan / Recommendation Clinical Impression  Patient seen by SLP for skilled treatment focusing on dysphagia goals. Patient's daughter in room as well. Of note, patient very awake and speaking with SLP. SLP discussed results of MBS with patient and daughter, including finding on 10/14/22 MBS of aspiration. SLP explained that patient did not aspirate on today's MBS but she continues to be at risk for this secondary to lethargy and swallow initiation delay. SLP explained recommendations of continuing with current diet at meal time but allowing thin liquids in between meals. Patient was receptive to having some water (not thickened). SLP assisted with feeding patient, holding cup and giving her sips. Although she continues with known swallow initiation delay, SLP did not observe any throat clearing or coughing. Sign posted in room regarding thin liquids and daughter fully educated. SLP to follow patient for diet toleration and ability to advance with solids and liquids.   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  Most of the history is provided by the patient's daughter, as the patient is slightly confused and quite sleepy, though she is interactive.  Daughter who is at the bedside states that patient at baseline is quite sharp, and independent.  However being back in February she was diagnosed with a urinary tract infection, and has been quite weak since that time and was just getting back to feeling like her normal self.  Came back to the ER on the 16th  of this month with concerns about bronchitis, was diagnosed with UTI and eventually prescribed antibiotics.  She never seem to get her strength back after this.  Over the last few days, patient has been slightly confused, has been very weak not eating or drinking very much.  Family also noticed that she seems to choke and cough whenever she tries to eat or drink something.  This has happened to her intermittently in the past, but is more consistent now.  Seen by SLP in February 2024 - dys1/2 and thin liquids. MBS completed 10/14/22 recommending Dys 1, nectar thick liquids.      SLP Plan  Continue with current plan of care      Recommendations for follow up therapy are one component of a multi-disciplinary discharge planning process, led by the attending physician.  Recommendations may be updated based on patient status, additional functional criteria and insurance authorization.    Recommendations  Diet recommendations: Dysphagia 1 (puree);Nectar-thick liquid;Other(comment) (may have thin liquids in between meals) Liquids provided via: Cup;Straw;Teaspoon Medication Administration: Crushed with puree Supervision: Full supervision/cueing for compensatory strategies;Staff to assist with self feeding Compensations: Slow rate;Small sips/bites Postural Changes and/or Swallow Maneuvers: Seated upright 90 degrees;Upright 30-60 min after meal                  Oral care BID;Oral care before and after PO     Dysphagia, oropharyngeal phase (R13.12)     Continue with current plan of care     Angela Nevin, MA, CCC-SLP Speech Therapy

## 2022-10-26 NOTE — Procedures (Signed)
Modified Barium Swallow Study  Patient Details  Name: Barbara Nelson MRN: [id number] Date of Birth: 02/27/1941  Today's Date: 10/26/2022  Modified Barium Swallow completed.  Full report located under Chart Review in the Imaging Section.  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  Most of the history is provided by the patient's daughter, as the patient is slightly confused and quite sleepy, though she is interactive.  Daughter who is at the bedside states that patient at baseline is quite sharp, and independent.  However being back in February she was diagnosed with a urinary tract infection, and has been quite weak since that time and was just getting back to feeling like her normal self.  Came back to the ER on the 16th of this month with concerns about bronchitis, was diagnosed with UTI and eventually prescribed antibiotics.  She never seem to get her strength back after this.  Over the last few days, patient has been slightly confused, has been very weak not eating or drinking very much.  Family also noticed that she seems to choke and cough whenever she tries to eat or drink something.  This has happened to her intermittently in the past, but is more consistent now.  Seen by SLP in February 2024 - dys1/2 and thin liquids. MBS completed 10/14/22 recommending Dys 1, nectar thick liquids.   Clinical Impression Patient presents with moderate oral and pharyngeal dysphagia with sensorimotor deficits.  Oral transiting significantly impaired with lingual pumping, poor organization and boluses spilling into pharynx off time poorly controlled.  Attempts at mastication of solids were ineffective with vertical pattern and anterior sulcus.  Patient  required pudding as well as liquids to finally transit poorly masticated solid -and this required significant effort.  Secretion retention noted in pharynx that mixed with barium retention resulting in inconsistent silent trace aspiration.  Patient had no sensation to pharyngeal retention and did not try swallow despite cues.  Thin liquid aspiration noted resulting in subtle throat clear but patient did not call adequately when she did cough on command to clear.  Various postures and counting 1-3 to help elicit swallow were not concern affect.  Currently patient is at elevated risk of malnutrition and aspiration.  Optimal diet to mitigate aspiration would be nectar liquid and very creamy pures right now that does not require mastication.  Patient was awake during eval but required frequent stimulation to maintain eye opening.  RN reports she was much more alert today than the last few days.  SLP will follow-up for family education and to ascertain indication for advancement of diet.  At this point we will maximize liquid nutrition IV ensures or Glucerna for energy conservation.  Highly recommend set up oral suction to allow suctioning the patient before and after meals due to secretion retention. SLP recommending continue Dys 1 (puree), nectar thick liquids during meals but allow all types of thin/regular liquids in between meals. Factors that may increase risk of adverse event in presence of aspiration [name] 2021): Frail or deconditioned;Dependence for feeding and/or oral hygiene;Weak cough  Swallow Evaluation Recommendations Recommendations: PO diet PO Diet Recommendation: Dysphagia 1 (Pureed);Mildly thick liquids (Level 2, nectar thick) Liquid Administration via: Cup;Straw;Spoon Medication Administration: Crushed with puree Supervision: Full supervision/cueing for swallowing strategies  Swallowing strategies  : Slow rate;Small bites/sips;Follow solids with liquids;Avoid mixed  consistencies Postural changes: Stay upright 30-60 min after meals;Position pt fully upright for meals Oral care recommendations: Oral care before PO;Staff/trained caregiver to provide oral care Caregiver Recommendations: Have oral suction available     Angela Nevin, MA, CCC-SLP Speech Therapy

## 2022-10-26 NOTE — Progress Notes (Signed)
  Daily Progress Note   Patient Name: Barbara Nelson       Date: 10/26/2022 DOB: [birth year]  Age: 82 y.o. MRN#: [id number] Attending Physician: Cathren Harsh, MD Primary Care Physician: [name], MD Admit Date: 10/11/2022 Length of Stay: 13 days  Palliative medicine team has continued to follow along with patient's medical journey. Have discussed symptom management medications with family. Goals for medical care have remained constant with wanting attempts to "fix the fixable".  Patient and family hopeful patient will continue to improve mentally and physically.  Will likely need rehab at time of discharge.  Patient can receive outpatient palliative medicine referral to continue conversations moving forward.  Patient noted to already be DNR.  Palliative medicine team will continue to follow along peripherally with patient's care.  Please reach out if acute palliative medicine team needs arise.  Thank you.  Alvester Morin, DO Palliative Care Provider PMT # [phone]

## 2022-10-26 NOTE — Progress Notes (Signed)
Speech Language Pathology Treatment: Dysphagia  Patient Details Name: Barbara Nelson MRN: [id number] DOB: 04-25-1941 Today's Date: 10/26/2022 Time: 4098-1191 SLP Time Calculation (min) (ACUTE ONLY): 10 min  Assessment / Plan / Recommendation Clinical Impression  Patient seen by SLP for skilled treatment focused on dysphagia goals. (New order for repeat bedside swallow evaluation acknowledged) When SLP arrived, patient being fed nectar thickened coffee via spoon sips. Delayed oral and suspected delayed swallow initiation observed but no overt s/s aspiration. SLP spoke with family members present (specifically patient's daughter) in room regarding potential for diet upgrade. SLP explained that as she has had poor sensation with aspiration on previous MBS', would need a repeat MBS to determine ability to advance to thin liquids but solid textures could be attempted at bedside with SLP supervision for safety. All in agreement for repeat MBS which will be scheduled for today.    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  Most of the history is provided by the patient's daughter, as the patient is slightly confused and quite sleepy, though she is interactive.  Daughter who is at the bedside states that patient at baseline is quite sharp, and independent.  However being back in February she was diagnosed with a urinary tract infection, and has been quite weak since that time and was just getting back to feeling like her normal self.  Came back to the ER on the 16th of this month with concerns about bronchitis, was diagnosed with UTI and eventually prescribed antibiotics.  She never seem to get her strength back after this.  Over the last few days, patient has been slightly  confused, has been very weak not eating or drinking very much.  Family also noticed that she seems to choke and cough whenever she tries to eat or drink something.  This has happened to her intermittently in the past, but is more consistent now.  Seen by SLP in February 2024 - dys1/2 and thin liquids. No MBS.      SLP Plan  Continue with current plan of care;MBS      Recommendations for follow up therapy are one component of a multi-disciplinary discharge planning process, led by the attending physician.  Recommendations may be updated based on patient status, additional functional criteria and insurance authorization.    Recommendations  Diet recommendations: Nectar-thick liquid;Dysphagia 1 (puree) Liquids provided via: Cup;Straw;Teaspoon Medication Administration: Crushed with puree Supervision: Full supervision/cueing for compensatory strategies;Staff to assist with self feeding Compensations: Slow rate;Small sips/bites Postural Changes and/or Swallow Maneuvers: Seated upright 90 degrees;Upright 30-60 min after meal                  Oral care BID;Oral care before and after PO   Frequent or constant Supervision/Assistance Dysphagia, oropharyngeal phase (R13.12)     Continue with current plan of care;MBS    Angela Nevin, MA, CCC-SLP Speech Therapy

## 2022-10-26 NOTE — Progress Notes (Signed)
Physical Therapy Treatment Patient Details Name: Barbara Nelson MRN: [id number] DOB: 11/15/1940 Today's Date: 10/26/2022   History of Present Illness Patient is a 82 year old female who presented with decreased urinary output, difficulty with mobility and swallowing. Patient was admitted with aspiration pneumonia, hyponatremia, PMH: HTN, recent UTI.    PT Comments    Pt is NOT progressing with her mobility. General bed mobility comments: Pt required Total Assist + 2 to transfer to seated EOB pt <5% assisted.  Static sitting required Max/Total Assist EOB x 4 min Therapist sitting next to her hold her up.  Poor collapsed posture.  Poor engagement.  Head downward.  Eyes closed.  Total Assist back to bed pt 0% and positioned RIGHT sidlying with multiple pillows.General transfer comment:  pt is a MAXI MOVE transfer and is currently non amb. Pt plans to D/C to SNF when medically stable.    Recommendations for follow up therapy are one component of a multi-disciplinary discharge planning process, led by the attending physician.  Recommendations may be updated based on patient status, additional functional criteria and insurance authorization.  Follow Up Recommendations  Can patient physically be transported by private vehicle: No    Assistance Recommended at Discharge Frequent or constant Supervision/Assistance  Patient can return home with the following Two people to help with walking and/or transfers;Two people to help with bathing/dressing/bathroom;Direct supervision/assist for medications management;Help with stairs or ramp for entrance;Assistance with cooking/housework;Assist for transportation;Assistance with feeding;Direct supervision/assist for financial management   Equipment Recommendations  None recommended by PT    Recommendations for Other Services       Precautions / Restrictions Precautions Precautions: Fall Restrictions Weight Bearing Restrictions: No     Mobility   Bed Mobility Overal bed mobility: Needs Assistance Bed Mobility: Supine to Sit, Sit to Supine Rolling: Total assist Sidelying to sit: Total assist Supine to sit: Total assist Sit to supine: Total assist Sit to sidelying: Total assist General bed mobility comments: Pt required Total Assist + 2 to transfer to seated EOB pt <5% assisted.  Static sitting required Max/Total Assist EOB x 4 min Therapist sitting next to her hold her up.  Poor collapsed posture.  Poor engagement.  Head downward.  Eyes closed.  Total Assist back to bed pt 0% and positioned RIGHT sidlying with multiple pillows.    Transfers                   General transfer comment: pt is a MAXI MOVE transfer    Ambulation/Gait               General Gait Details: pt is non amb   Social research officer, government Rank[employer] (Stroke Patients Only)       Balance                                            Cognition Arousal/Alertness: Lethargic Behavior During Therapy: Flat affect Overall Cognitive Status: Difficult to assess Area of Impairment: Attention, Following commands                 Orientation Level: Disoriented to, Place, Time, Situation     Following Commands: Follows one step commands inconsistently     Problem Solving: Slow processing, Requires verbal cues, Requires tactile cues, Difficulty sequencing, Decreased  initiation General Comments: appears lethargic with eyes closed 90% of session.  Responds better to her daughter.        Exercises      General Comments        Pertinent Vitals/Pain Pain Assessment Pain Assessment: No/denies pain    Home Living                          Prior Function            PT Goals (current goals can now be found in the care plan section) Progress towards PT goals: Progressing toward goals    Frequency    Min 1X/week      PT Plan Current plan remains appropriate     Co-evaluation              AM-PAC PT "6 Clicks" Mobility   Outcome Measure  Help needed turning from your back to your side while in a flat bed without using bedrails?: Total Help needed moving from lying on your back to sitting on the side of a flat bed without using bedrails?: Total Help needed moving to and from a bed to a chair (including a wheelchair)?: Total Help needed standing up from a chair using your arms (e.g., wheelchair or bedside chair)?: Total Help needed to walk in hospital room?: Total Help needed climbing 3-5 steps with a railing? : Total 6 Click Score: 6    End of Session Equipment Utilized During Treatment: Gait belt;Oxygen Activity Tolerance: Patient limited by lethargy Patient left: in bed;with call bell/phone within reach;with bed alarm set;with family/visitor present Nurse Communication: Mobility status PT Visit Diagnosis: Other abnormalities of gait and mobility (R26.89);Muscle weakness (generalized) (M62.81)     Time: 2956-2130 PT Time Calculation (min) (ACUTE ONLY): 26 min  Charges:  $Therapeutic Activity: 23-37 mins                     {Deshan Hemmelgarn  PTA Acute  Rehabilitation Services Office M-F          [phone]

## 2022-10-26 NOTE — Progress Notes (Signed)
Chaplain engaged in a follow-up visit with Chalisa, her daughter, and an initial visit with her caregiver at the bedside. Chaplain continued to offer compassionate presence and support if needed. Daughter shared that Brittish is doing really well and progressing.     10/26/22 1100  Spiritual Encounters  Type of Visit Follow up  Care provided to: Pt and family  Reason for visit Routine spiritual support

## 2022-10-26 NOTE — Plan of Care (Signed)
  Problem: Coping: Goal: Level of anxiety will decrease Outcome: Progressing   Problem: Nutrition: Goal: Adequate nutrition will be maintained Outcome: Progressing   Problem: Activity: Goal: Risk for activity intolerance will decrease Outcome: Progressing   Problem: Elimination: Goal: Will not experience complications related to bowel motility Outcome: Progressing Goal: Will not experience complications related to urinary retention Outcome: Progressing   Problem: Safety: Goal: Ability to remain free from injury will improve Outcome: Progressing   Problem: Skin Integrity: Goal: Risk for impaired skin integrity will decrease Outcome: Progressing

## 2022-10-26 NOTE — Care Management Important Message (Signed)
Important Message  Patient Details IM Letter given. Name: Barbara Nelson MRN: [id number] Date of Birth: June 27, 1940   Medicare Important Message Given:  Yes     Caren Macadam 10/26/2022, 1:21 PM

## 2022-10-27 DIAGNOSIS — R34 Anuria and oliguria: Secondary | ICD-10-CM | POA: Diagnosis not present

## 2022-10-27 DIAGNOSIS — R29898 Other symptoms and signs involving the musculoskeletal system: Secondary | ICD-10-CM | POA: Diagnosis not present

## 2022-10-27 DIAGNOSIS — J189 Pneumonia, unspecified organism: Secondary | ICD-10-CM | POA: Diagnosis not present

## 2022-10-27 DIAGNOSIS — R410 Disorientation, unspecified: Secondary | ICD-10-CM | POA: Diagnosis not present

## 2022-10-27 LAB — BASIC METABOLIC PANEL
Anion gap: 5 (ref 5–15)
BUN: 12 mg/dL (ref 8–23)
CO2: 34 mmol/L — ABNORMAL HIGH (ref 22–32)
Calcium: 8.2 mg/dL — ABNORMAL LOW (ref 8.9–10.3)
Chloride: 102 mmol/L (ref 98–111)
Creatinine, Ser: 0.57 mg/dL (ref 0.44–1.00)
GFR, Estimated: >60 mL/min (ref >60)
Glucose, Bld: 88 mg/dL (ref 70–99)
Potassium: 3.2 mmol/L — ABNORMAL LOW (ref 3.5–5.1)
Sodium: 141 mmol/L (ref 135–145)

## 2022-10-27 LAB — VITAMIN B1: Vitamin B1 (Thiamine): 121.2 nmol/L (ref 66.5–200.0)

## 2022-10-27 LAB — CBC
HCT: 31 % — ABNORMAL LOW (ref 36.0–46.0)
Hemoglobin: 10.4 g/dL — ABNORMAL LOW (ref 12.0–15.0)
MCH: 28.3 pg (ref 26.0–34.0)
MCHC: 33.5 g/dL (ref 30.0–36.0)
MCV: 84.2 fL (ref 80.0–100.0)
Platelets: 170 10*3/uL (ref 150–400)
RBC: 3.68 MIL/uL — ABNORMAL LOW (ref 3.87–5.11)
RDW: 17.6 % — ABNORMAL HIGH (ref 11.5–15.5)
WBC: 5.1 10*3/uL (ref 4.0–10.5)
nRBC: 0 % (ref 0.0–0.2)

## 2022-10-27 MED ORDER — FUROSEMIDE 40 MG PO TABS: 40.0000 mg | ORAL_TABLET | Freq: Every day | ORAL | Status: DC

## 2022-10-27 MED ORDER — FUROSEMIDE 40 MG PO TABS
40.0000 mg | ORAL_TABLET | Freq: Every day | ORAL | Status: DC
  Filled 2022-10-27: qty 1

## 2022-10-27 MED ORDER — POTASSIUM CHLORIDE 10 MEQ/100ML IV SOLN
10.0000 meq | INTRAVENOUS | Status: AC
  Administered 2022-10-27 (×2): 10 meq via INTRAVENOUS
  Filled 2022-10-27: qty 100

## 2022-10-27 MED ORDER — POTASSIUM CHLORIDE 10 MEQ/100ML IV SOLN: 10.0000 meq | INTRAVENOUS | Status: DC

## 2022-10-27 NOTE — Progress Notes (Signed)
[facility]                                                                              Barbara Nelson, is a 82 y.o. female, DOB - Feb 09, 1941, ZOX:[id number] Admit date - 10/11/2022    Outpatient Primary MD for the patient is [name], MD  LOS - 14  days  Chief Complaint  Patient presents with   Urinary Tract Infection       Brief summary   Patient is a 82 year old female, lives with daughter, PMH recurrent UTI since February with gradual physical decline, who presented with generalized weakness, poor oral intake, confusion, difficulty swallowing with choking and coughing. Admitted for aspiration pneumonia. Possible UTI. History of multiple episodes of pneumonia and UTI since February.    Patient admitted as above with recurrent episode of pneumonia with moderate hypoxia and respiratory distress as well as concurrent questionable UTI.  Patient completed antibiotic course but continues to have respiratory symptoms and hypoxia.  Speech evaluation improved that patient does have moderately profound dysphagia requiring nectar thick liquids which has limited her p.o. intake in both the caloric and free water aspects.  Given patient's poor nutritional status, ongoing aspiration, and concern for failure to thrive palliative care was consulted.   Assessment & Plan    Principal Problem:   Aspiration pneumonia (HCC), sepsis POA Acute respiratory failure with hypoxia, POA -Underlying history of chronic pulmonary blastomycosis, s/p lobectomy in 2014, chronic respiratory failure on 2 L O2 via [place], - Sepsis physiology resolved -SLP evaluation recommended dysphagia 1 diet with nectar thick liquids -Daughter stating abrupt change in her ability to tolerate PO's  "she was eating fine before admission", CT head negative for acute CVA or acute pathology. -Chest x-ray 6/4 showed pulmonary edema, bilateral pleural effusions, increased on the right, increased retrocardiac airspace  opacity atelectasis or pneumonia -CT chest 6/8 showed interval development of posterior left lower lobe collapse/consolidation, patchy airspace disease in the right lung base, new left pleural effusion with small to moderate right effusion with loculation. - Dr Thora Lance discussed options with the family including IR thoracentesis or pigtail or empirically treating parapneumonic effusion for 4 weeks antibiotic course.  Family leaning towards prolonged antibiotic course.  IV Rocephin was started and recommended discharge on cefdinir suspension to complete 4-week course of antibiotics.  Outpatient follow-up with pulmonology in 4 to 5 weeks to repeat CXR -DC IV Lasix, transition to oral Lasix 40 mg daily - on discharge, transition to oral antibiotics for 4 weeks course of cefdinir and outpatient follow-up with pulmonology.  Family agreeable with the plan  Active Problems: Odynophagia, oral candidiasis, ?  Pharyngeal and esophageal candidiasis -Currently on Diflucan, started on 5/28, Magic mouthwash -On dysphagia 1 pured diet with nectar thick liquids -Continue Carafate, Diflucan, Magic mouthwash, PPI.  GI signed off. -Repeat SLP evaluation on 6/10, still at dysphagia 1 diet however allowed thin liquids when patient is alert and oriented  Acute metabolic encephalopathy, failure to thrive, generalized debility -Per previous documentation, patient has been struggling with multiple episodes of PNA, UTI since February.  Has had multiple rounds of antibiotics with minimal improvement.  -Patient  is now fairly alert and oriented x 3, family at the bedside  Hyponatremia -Resolved  Hypokalemia -Currently on daily replacement     Malnutrition of moderate degree, failure to thrive Etiology: chronic illness Signs/Symptoms: energy intake < 75% for > or equal to 1 month, percent weight loss (9.5% in 5 months) Percent weight loss: 9.5 % (in 5 months) Interventions: Refer to RD note for recommendations, Magic  cup, Prostat, MVI Estimated body mass index is 32.27 kg/m as calculated from the following:   Height as of this encounter: 5\' 6"  (1.676 m).   Weight as of this encounter: 90.7 kg.  -She is eating better now   Urinary retention -UA negative for UTI  -Continue Flomax for 7 days  Code Status: DNI DVT Prophylaxis:  enoxaparin (LOVENOX) injection 40 mg Start: 10/11/22 2200 SCDs Start: 10/11/22 1132   Level of Care: Level of care: Med-Surg Family Communication: Updated patient's daughter and brother at the bedside, daughter on the speaker phone today Disposition Plan:      Remains inpatient appropriate:   Hopefully DC to SNF in next 24 to 48 hours   Procedures:    Consultants:   Palliative medicine GI Pulmonology Antimicrobials:   Anti-infectives (From admission, onward)    Start     Dose/Rate Route Frequency Ordered Stop   10/24/22 1600  cefTRIAXone (ROCEPHIN) 2 g in sodium chloride 0.9 % 100 mL IVPB        2 g 200 mL/hr over 30 Minutes Intravenous Every 24 hours 10/24/22 1500     10/13/22 1400  fluconazole (DIFLUCAN) IVPB 200 mg       See Hyperspace for full Linked Orders Report.   200 mg 100 mL/hr over 60 Minutes Intravenous Daily 10/12/22 1138 10/25/22 1621   10/13/22 1000  fluconazole (DIFLUCAN) IVPB 100 mg  Status:  Discontinued       See Hyperspace for full Linked Orders Report.   100 mg 50 mL/hr over 60 Minutes Intravenous Daily 10/12/22 1036 10/12/22 1138   10/12/22 1130  fluconazole (DIFLUCAN) IVPB 200 mg       See Hyperspace for full Linked Orders Report.   200 mg 100 mL/hr over 60 Minutes Intravenous  Once 10/12/22 1036 10/13/22 1114   10/12/22 1000  cefTRIAXone (ROCEPHIN) 2 g in sodium chloride 0.9 % 100 mL IVPB        2 g 200 mL/hr over 30 Minutes Intravenous Every 24 hours 10/11/22 1148 10/15/22 1327   10/12/22 1000  metroNIDAZOLE (FLAGYL) IVPB 500 mg        500 mg 100 mL/hr over 60 Minutes Intravenous Every 12 hours 10/11/22 1148 10/15/22 2255    10/11/22 1145  levofloxacin (LEVAQUIN) IVPB 500 mg  Status:  Discontinued        500 mg 100 mL/hr over 60 Minutes Intravenous Every 24 hours 10/11/22 1130 10/11/22 1146   10/11/22 0900  levofloxacin (LEVAQUIN) IVPB 750 mg        750 mg 100 mL/hr over 90 Minutes Intravenous  Once 10/11/22 0858 10/11/22 1127          Medications  (feeding supplement) PROSource Plus  30 mL Oral TID BM   acetaminophen  650 mg Oral TID   enoxaparin (LOVENOX) injection  40 mg Subcutaneous Q24H   furosemide  20 mg Intravenous BID   hydrocortisone  25 mg Rectal BID   ketotifen  1 drop Both Eyes BID   magic mouthwash  5 mL Oral QID   metoprolol succinate  12.5 mg Oral Daily   multivitamin with minerals  1 tablet Oral Daily   pantoprazole (PROTONIX) IV  40 mg Intravenous Daily   polyethylene glycol  17 g Oral BID   potassium chloride  20 mEq Oral BID   sucralfate  1 g Oral TID WC & HS   tamsulosin  0.4 mg Oral Daily   thiamine  100 mg Oral Daily      Subjective:   Barbara Nelson was seen and examined today.  Patient is now much more alert and oriented, appears close to her baseline.  Family at the bedside.  Denies any sore throat or pain with swallowing. Objective:   Vitals:   10/26/22 0608 10/26/22 1420 10/26/22 2121 10/27/22 0558  BP: (!) 142/84 135/76 138/82 (!) 145/76  Pulse: 74 86 87 80  Resp: 18 16 16 16   Temp: 98.6 F (37 C) (!) 97.4 F (36.3 C) 99 F (37.2 C) 98.3 F (36.8 C)  TempSrc:  Axillary    SpO2: 100% 94% 100% 100%  Weight:      Height:        Intake/Output Summary (Last 24 hours) at 10/27/2022 1417 Last data filed at 10/26/2022 2200 Gross per 24 hour  Intake 426.93 ml  Output --  Net 426.93 ml     Wt Readings from Last 3 Encounters:  10/11/22 90.7 kg  10/01/22 90.7 kg  08/19/22 92.1 kg    Physical Exam General: Alert and oriented x 3, NAD Cardiovascular: S1 S2 clear, RRR.  Respiratory: CTAB, no wheezing Gastrointestinal: Soft, nontender, nondistended,  NBS Ext: no pedal edema bilaterally Neuro: LE weakness, upper extremity is 5/5 Psych: Normal affect    Data Reviewed:  I have personally reviewed following labs    CBC Lab Results  Component Value Date   WBC 5.1 10/27/2022   RBC 3.68 (L) 10/27/2022   HGB 10.4 (L) 10/27/2022   HCT 31.0 (L) 10/27/2022   MCV 84.2 10/27/2022   MCH 28.3 10/27/2022   PLT 170 10/27/2022   MCHC 33.5 10/27/2022   RDW 17.6 (H) 10/27/2022   LYMPHSABS 1.1 10/11/2022   MONOABS 0.5 10/11/2022   EOSABS 0.1 10/11/2022   BASOSABS 0.0 10/11/2022     Last metabolic panel Lab Results  Component Value Date   NA 141 10/27/2022   K 3.2 (L) 10/27/2022   CL 102 10/27/2022   CO2 34 (H) 10/27/2022   BUN 12 10/27/2022   CREATININE 0.57 10/27/2022   GLUCOSE 88 10/27/2022   GFRNONAA >60 10/27/2022   GFRAA 57 (L) 03/03/2012   CALCIUM 8.2 (L) 10/27/2022   PHOS 2.5 07/10/2022   PROT 5.2 (L) 10/21/2022   ALBUMIN 1.5 (L) 10/21/2022   BILITOT 0.4 10/21/2022   ALKPHOS 168 (H) 10/21/2022   AST 36 10/21/2022   ALT 20 10/21/2022   ANIONGAP 5 10/27/2022    CBG (last 3)  No results for input(s): "GLUCAP" in the last 72 hours.    Coagulation Profile: No results for input(s): "INR", "PROTIME" in the last 168 hours.   Radiology Studies: I have personally reviewed the imaging studies  DG Swallowing Func-Speech Pathology  Result Date: 10/26/2022 Table formatting from the original result was not included. Modified Barium Swallow Study Patient Details Name: Barbara Nelson MRN: [id number] Date of Birth: September 07, 1940 Today's Date: 10/26/2022 HPI/PMH: HPI: NICEY KECK is a 82 y.o. retired Engineer, civil (consulting) who lives with her daughter with medical history significant for hypertension, multiple recent UTIs being admitted to the hospital with weakness  and malaise for the last couple weeks found to have possible aspiration pneumonia.  Most of the history is provided by the patient's daughter, as the patient is slightly confused and  quite sleepy, though she is interactive.  Daughter who is at the bedside states that patient at baseline is quite sharp, and independent.  However being back in February she was diagnosed with a urinary tract infection, and has been quite weak since that time and was just getting back to feeling like her normal self.  Came back to the ER on the 16th of this month with concerns about bronchitis, was diagnosed with UTI and eventually prescribed antibiotics.  She never seem to get her strength back after this.  Over the last few days, patient has been slightly confused, has been very weak not eating or drinking very much.  Family also noticed that she seems to choke and cough whenever she tries to eat or drink something.  This has happened to her intermittently in the past, but is more consistent now.  Seen by SLP in February 2024 - dys1/2 and thin liquids. MBS completed 10/14/22 recommending Dys 1, nectar thick liquids. Clinical Impression: Clinical Impression: Patient presents with moderate oral and pharyngeal dysphagia with sensorimotor deficits.  Oral transiting significantly impaired with lingual pumping, poor organization and boluses spilling into pharynx off time poorly controlled.  Attempts at mastication of solids were ineffective with vertical pattern and anterior sulcus.  Patient required pudding as well as liquids to finally transit poorly masticated solid -and this required significant effort.  Secretion retention noted in pharynx that mixed with barium retention resulting in inconsistent silent trace aspiration.  Patient had no sensation to pharyngeal retention and did not try swallow despite cues.  Thin liquid aspiration noted resulting in subtle throat clear but patient did not call adequately when she did cough on command to clear.  Various postures and counting 1-3 to help elicit swallow were not concern affect.  Currently patient is at elevated risk of malnutrition and aspiration.  Optimal diet to  mitigate aspiration would be nectar liquid and very creamy pures right now that does not require mastication.  Patient was awake during eval but required frequent stimulation to maintain eye opening.  RN reports she was much more alert today than the last few days.  SLP will follow-up for family education and to ascertain indication for advancement of diet.  At this point we will maximize liquid nutrition IV ensures or Glucerna for energy conservation.  Highly recommend set up oral suction to allow suctioning the patient before and after meals due to secretion retention. SLP recommending continue Dys 1 (puree), nectar thick liquids during meals but allow all types of thin/regular liquids in between meals. Factors that may increase risk of adverse event in presence of aspiration [name] & [name] 2021): Factors that may increase risk of adverse event in presence of aspiration [name] & [name] 2021): Frail or deconditioned; Dependence for feeding and/or oral hygiene; Weak cough Recommendations/Plan: Swallowing Evaluation Recommendations Swallowing Evaluation Recommendations Recommendations: PO diet PO Diet Recommendation: Dysphagia 1 (Pureed); Mildly thick liquids (Level 2, nectar thick) Liquid Administration via: Cup; Straw; Spoon Medication Administration: Crushed with puree Supervision: Full supervision/cueing for swallowing strategies Swallowing strategies  : Slow rate; Small bites/sips; Follow solids with liquids; Avoid mixed consistencies Postural changes: Stay upright 30-60 min after meals; Position pt fully upright for meals Oral care recommendations: Oral care before PO; Staff/trained caregiver to provide oral care Recommended consults: Consider Palliative care Caregiver  Recommendations: Have oral suction available Treatment Plan Treatment Plan Treatment recommendations: Therapy as outlined in treatment plan below Follow-up recommendations: Follow physicians's recommendations for discharge plan and follow  up therapies Functional status assessment: Patient has had a recent decline in their functional status and demonstrates the ability to make significant improvements in function in a reasonable and predictable amount of time. Treatment frequency: Min 2x/week Treatment duration: 1 week Interventions: Aspiration precaution training; Compensatory techniques; Patient/family education; Diet toleration management by SLP; Trials of upgraded texture/liquids Recommendations Recommendations for follow up therapy are one component of a multi-disciplinary discharge planning process, led by the attending physician.  Recommendations may be updated based on patient status, additional functional criteria and insurance authorization. Assessment: Orofacial Exam: Orofacial Exam Oral Cavity: Oral Hygiene: WFL Oral Cavity - Dentition: Adequate natural dentition Orofacial Anatomy: WFL Oral Motor/Sensory Function: Generalized oral weakness Anatomy: Anatomy: Suspected cervical osteophytes Boluses Administered: Boluses Administered Boluses Administered: Thin liquids (Level 0); Mildly thick liquids (Level 2, nectar thick)  Oral Impairment Domain: Oral Impairment Domain Lip Closure: No labial escape Tongue control during bolus hold: Posterior escape of greater than half of bolus Bolus preparation/mastication: Minimal chewing/mashing with majority of bolus unchewed Bolus transport/lingual motion: Repetitive/disorganized tongue motion Oral residue: Residue collection on oral structures Location of oral residue : Tongue Initiation of pharyngeal swallow : Pyriform sinuses  Pharyngeal Impairment Domain: Pharyngeal Impairment Domain Soft palate elevation: No bolus between soft palate (SP)/pharyngeal wall (PW) Laryngeal elevation: Partial superior movement of thyroid cartilage/partial approximation of arytenoids to epiglottic petiole Anterior hyoid excursion: Partial anterior movement Epiglottic movement: Complete inversion Laryngeal vestibule  closure: Incomplete, narrow column air/contrast in laryngeal vestibule Pharyngeal stripping wave : Present - diminished Pharyngeal contraction (A/P view only): N/A Pharyngoesophageal segment opening: -- (difficult view due to shoulder obstruction) Tongue base retraction: No contrast between tongue base and posterior pharyngeal wall (PPW) Pharyngeal residue: Collection of residue within or on pharyngeal structures Location of pharyngeal residue: Valleculae; Pyriform sinuses; Aryepiglottic folds; Tongue base; Diffuse (>3 areas)  Esophageal Impairment Domain: Esophageal Impairment Domain Esophageal clearance upright position: Complete clearance, esophageal coating Penetration/Aspiration Scale Score: 3-Material enters airway, remains ABOVE vocal cords and not ejected out; thin, nectar thick Compensatory Strategies: Compensatory Strategies Compensatory strategies: Yes Straw: Ineffective Ineffective Straw: Thin liquid (Level 0) Effortful swallow: -- (inability to perform) Multiple swallows: Effective (partially) Liquid wash: Effective Effective Liquid Wash: Mildly thick liquid (Level 2, nectar thick); Puree; Solid Other(comment): -- (counting to help initiate swallow, neck extenstion to push barium into pharynx)   General Information: Caregiver present: No  Diet Prior to this Study: Dysphagia 1 (pureed); Mildly thick liquids (Level 2, nectar thick)   Temperature : Normal   Respiratory Status: WFL   Supplemental O2: Nasal cannula   History of Recent Intubation: No  Behavior/Cognition: Alert; Cooperative; Pleasant mood; Lethargic/Drowsy Self-Feeding Abilities: Dependent for feeding Baseline vocal quality/speech: Not observed Volitional Cough: Unable to elicit Volitional Swallow: Unable to elicit Exam Limitations: Fatigue Goal Planning: Prognosis for improved oropharyngeal function: Fair Barriers to Reach Goals: Motivation; Time post onset Barriers/Prognosis Comment: Recurrent hospital admissions and severe deconditioning  Patient/Family Stated Goal: family would like patient to be able to advance to some solids beyond Dys 1 Consulted and agree with results and recommendations: Pt unable/family or caregiver not available Pain: Pain Assessment Pain Assessment: No/denies pain Pain Score: 0 Faces Pain Scale: 0 Breathing: 0 Negative Vocalization: 0 Facial Expression: 0 Body Language: 0 Consolability: 0 PAINAD Score: 0 Pain Location: bilateral knees Pain Descriptors / Indicators: Grimacing;  Guarding Pain Intervention(s): Monitored during session End of Session: Start Time:SLP Start Time (ACUTE ONLY): 1305 Stop Time: SLP Stop Time (ACUTE ONLY): 1325 Time Calculation:SLP Time Calculation (min) (ACUTE ONLY): 20 min Charges: SLP Evaluations $ SLP Speech Visit: 1 Visit SLP Evaluations $MBS Swallow: 1 Procedure SLP visit diagnosis: SLP Visit Diagnosis: Dysphagia, oropharyngeal phase (R13.12) Past Medical History: Past Medical History: Diagnosis Date  Benign essential HTN 10/31/2015  Cardiac tamponade due to viral pericarditis   LBBB (left bundle branch block)   Lung mass   fungal infection s/p resection at [facility]  Postoperative atrial fibrillation [facility])   Pulmonary nodule  Past Surgical History: Past Surgical History: Procedure Laterality Date  ABDOMINAL HYSTERECTOMY  1988  chest tube placement  09/03/11  LUNG REMOVAL, PARTIAL    right knee arthoscopy with meniscal repair    subxiphoid pericardial window and drainage anterior partial pericardiectomy  09/03/11 Angela Nevin, MA, CCC-SLP Speech Therapy       Thad Ranger M.D. [facility] 10/27/2022, 2:17 PM  Available via Epic secure chat 7am-7pm After 7 pm, please refer to night coverage provider listed on amion.

## 2022-10-27 NOTE — Plan of Care (Signed)
  Problem: Education: Goal: Knowledge of General Education information will improve Description: Including pain rating scale, medication(s)/side effects and non-pharmacologic comfort measures Outcome: Progressing   Problem: Health Behavior/Discharge Planning: Goal: Ability to manage health-related needs will improve Outcome: Progressing   Problem: Clinical Measurements: Goal: Ability to maintain clinical measurements within normal limits will improve Outcome: Progressing Goal: Will remain free from infection Outcome: Progressing Goal: Respiratory complications will improve Outcome: Progressing Goal: Cardiovascular complication will be avoided Outcome: Progressing   Problem: Nutrition: Goal: Adequate nutrition will be maintained Outcome: Adequate for Discharge   Problem: Coping: Goal: Level of anxiety will decrease Outcome: Progressing   Problem: Elimination: Goal: Will not experience complications related to bowel motility Outcome: Completed/Met Goal: Will not experience complications related to urinary retention Outcome: Completed/Met   Problem: Pain Managment: Goal: General experience of comfort will improve Outcome: Progressing   Problem: Safety: Goal: Ability to remain free from injury will improve Outcome: Progressing   Problem: Skin Integrity: Goal: Risk for impaired skin integrity will decrease Outcome: Progressing

## 2022-10-27 NOTE — Progress Notes (Signed)
Occupational Therapy Treatment Patient Details Name: Barbara Nelson MRN: [id number] DOB: 12-18-1940 Today's Date: 10/27/2022   History of present illness Patient is a 82 year old female who presented with decreased urinary output, difficulty with mobility and swallowing. Patient was admitted with aspiration pneumonia, hyponatremia, PMH: HTN, recent UTI.   OT comments  Patient continues to have lethargy sitting EOB increased than compared to being in bed. Patient noted to be resistive at times to multimodal cues for postural correction sitting EOB. Patient was unable to progress sitting balance past +2 TD at this time. Patient's discharge plan remains appropriate at this time. OT will continue to follow acutely.     Recommendations for follow up therapy are one component of a multi-disciplinary discharge planning process, led by the attending physician.  Recommendations may be updated based on patient status, additional functional criteria and insurance authorization.    Assistance Recommended at Discharge Frequent or constant Supervision/Assistance  Patient can return home with the following  Two people to help with walking and/or transfers;Two people to help with bathing/dressing/bathroom;Assistance with cooking/housework;Direct supervision/assist for medications management;Assist for transportation;Help with stairs or ramp for entrance;Direct supervision/assist for financial management   Equipment Recommendations  Other (comment) (hoyer lift)       Precautions / Restrictions Precautions Precautions: Fall Restrictions Weight Bearing Restrictions: No       Mobility Bed Mobility Overal bed mobility: Needs Assistance Bed Mobility: Supine to Sit, Sit to Supine Rolling: Total assist   Supine to sit: Total assist   Sit to sidelying: Total assist             Balance Overall balance assessment: Needs assistance Sitting-balance support: Feet supported, Bilateral upper  extremity supported Sitting balance-Leahy Scale: Poor Sitting balance - Comments: less than 5% effort from patient during session Postural control: Posterior lean, Left lateral lean           ADL either performed or assessed with clinical judgement   ADL Overall ADL's : Needs assistance/impaired             General ADL Comments: Patient was awake in bed reporting that she was agreeable to sit on EOB. patient was TD to advance to EOB with cues to advance each leg and encourage patients participation. patients daugther in room reported " she has weakness in her legs and we need to move her legs".  education provided to family and patient about talking patient through movements as she trys to help with them to encourage patietn to particiapte in brain body connection. patient was TD for supine to sit on EOB with TD for sitting balance with posterior leaning and leaning laterally to the R side. patient when encouraged to try to help hold herself up, daughter reported " she has been in bed and is doing the best she can". patient was encouarged to participate in postural correction but physically unable and resistive at times to transition away from posterior pelvic tilt positioning. returned to bed with +2 TD.      Cognition Arousal/Alertness: Lethargic Behavior During Therapy: Flat affect Overall Cognitive Status: Difficult to assess                         Following Commands: Follows one step commands inconsistently       General Comments: appears lethargic with eyes closed 90% of session.  Responds better to her daughter. spoke to therapist more while in bed. appeared to be too difficult to get words  out while sitting EOB                   Pertinent Vitals/ Pain       Pain Assessment Pain Assessment: No/denies pain         Frequency  Min 2X/week        Progress Toward Goals  OT Goals(current goals can now be found in the care plan section)  Progress  towards OT goals: Not progressing toward goals - comment     Plan Discharge plan remains appropriate       AM-PAC OT "6 Clicks" Daily Activity     Outcome Measure   Help from another person eating meals?: Total Help from another person taking care of personal grooming?: Total Help from another person toileting, which includes using toliet, bedpan, or urinal?: Total Help from another person bathing (including washing, rinsing, drying)?: Total Help from another person to put on and taking off regular upper body clothing?: Total Help from another person to put on and taking off regular lower body clothing?: Total 6 Click Score: 6    End of Session    OT Visit Diagnosis: Pain;Muscle weakness (generalized) (M62.81);Other symptoms and signs involving the nervous system (R29.898)   Activity Tolerance Patient limited by lethargy   Patient Left in bed;with call bell/phone within reach;with family/visitor present   Nurse Communication Mobility status        Time: 1610-9604 OT Time Calculation (min): 19 min  Charges: OT General Charges $OT Visit: 1 Visit OT Treatments $Therapeutic Activity: 8-22 mins  Rosalio Loud, MS Acute Rehabilitation Department Office# [phone]   Selinda Flavin 10/27/2022, 1:29 PM

## 2022-10-28 DIAGNOSIS — J690 Pneumonitis due to inhalation of food and vomit: Secondary | ICD-10-CM | POA: Diagnosis not present

## 2022-10-28 LAB — BASIC METABOLIC PANEL
Anion gap: 7 (ref 5–15)
BUN: 12 mg/dL (ref 8–23)
CO2: 30 mmol/L (ref 22–32)
Calcium: 8.4 mg/dL — ABNORMAL LOW (ref 8.9–10.3)
Chloride: 104 mmol/L (ref 98–111)
Creatinine, Ser: 0.67 mg/dL (ref 0.44–1.00)
GFR, Estimated: >60 mL/min (ref >60)
Glucose, Bld: 77 mg/dL (ref 70–99)
Potassium: 3.3 mmol/L — ABNORMAL LOW (ref 3.5–5.1)
Sodium: 141 mmol/L (ref 135–145)

## 2022-10-28 LAB — BLOOD GAS, ARTERIAL
Acid-Base Excess: 13.3 mmol/L — ABNORMAL HIGH (ref 0.0–2.0)
Bicarbonate: 38.3 mmol/L — ABNORMAL HIGH (ref 20.0–28.0)
O2 Saturation: 99.4 %
Patient temperature: 37
pCO2 arterial: 48 mmHg (ref 32–48)
pH, Arterial: 7.51 — ABNORMAL HIGH (ref 7.35–7.45)
pO2, Arterial: 83 mmHg (ref 83–108)

## 2022-10-28 LAB — CBC
HCT: 33.3 % — ABNORMAL LOW (ref 36.0–46.0)
Hemoglobin: 10.7 g/dL — ABNORMAL LOW (ref 12.0–15.0)
MCH: 28.5 pg (ref 26.0–34.0)
MCHC: 32.1 g/dL (ref 30.0–36.0)
MCV: 88.6 fL (ref 80.0–100.0)
Platelets: 172 10*3/uL (ref 150–400)
RBC: 3.76 MIL/uL — ABNORMAL LOW (ref 3.87–5.11)
RDW: 18.1 % — ABNORMAL HIGH (ref 11.5–15.5)
WBC: 6.8 10*3/uL (ref 4.0–10.5)
nRBC: 0 % (ref 0.0–0.2)

## 2022-10-28 MED ORDER — KATE FARMS STANDARD 1.4 PO LIQD
325.0000 mL | Freq: Two times a day (BID) | ORAL | Status: DC
  Filled 2022-10-28 (×5): qty 325

## 2022-10-28 MED ORDER — ACETAMINOPHEN 325 MG PO TABS
650.0000 mg | ORAL_TABLET | Freq: Once | ORAL | Status: DC
  Filled 2022-10-28: qty 2

## 2022-10-28 MED ORDER — DEXTROSE-SODIUM CHLORIDE 5-0.9 % IV SOLN: INTRAVENOUS | Status: DC

## 2022-10-28 MED ORDER — ACETAMINOPHEN 650 MG RE SUPP
650.0000 mg | Freq: Four times a day (QID) | RECTAL | Status: DC | PRN
  Administered 2022-10-28: 650 mg via RECTAL
  Filled 2022-10-28 (×2): qty 1

## 2022-10-28 NOTE — TOC Progression Note (Signed)
Transition of Care [facility]) - Progression Note    Patient Details  Name: Barbara Nelson MRN: [id number] Date of Birth: 1941/01/19  [facility]) CM/SW Contact  Amada Jupiter, LCSW Phone Number: 10/28/2022, 3:02 PM  Clinical Narrative:     Pt/family have accepted a SNF bed with [facility] and have explained to family and facility that MD anticipates may be medically ready in 1-2 days.  Admissions notes they can hold this bed until Friday - will ask oncoming TOC to keep facility posted and family may want to try and pay for private hold if needed.    Expected Discharge Plan: Home w Home Health Services Barriers to Discharge: Continued Medical Work up  Expected Discharge Plan and Services In-house Referral: Clinical Social Work   Post Acute Care Choice: Home Health Living arrangements for the past 2 months: Single Family Home                 DME Arranged: N/A DME Agency: NA       HH Arranged: PT, OT HH Agency: [employer]         Social Determinants of Health (SDOH) Interventions SDOH Screenings   Food Insecurity: No Food Insecurity (10/11/2022)  Housing: Low Risk  (10/11/2022)  Transportation Needs: No Transportation Needs (10/11/2022)  Utilities: Not At Risk (10/11/2022)  Tobacco Use: Medium Risk (10/11/2022)    Readmission Risk Interventions     No data to display

## 2022-10-28 NOTE — Progress Notes (Signed)
PROGRESS NOTE  Barbara Nelson  DOB: 04-14-1941  PCP: [name], MD GNF:[id number]  DOA: 10/11/2022  LOS: 15 days  Hospital Day: 18  Brief narrative: Barbara Nelson is a 82 y.o. female with PMH significant for HTN, A-fib, h/o cardiac tamponade due to viral pericarditis, chronic pulmonary blastomycosis, s/p lobectomy at [facility] 2 2014, chronic respiratory failure on 2 L O2 via  who lives with her daughter.  For the last 4 months, patient has had recurrent pneumonia, UTI and physical decline. 5/26, patient was brought to the ED with generalized weakness, poor oral intake, confusion, difficulty swallowing with choking and coughing She was admitted with aspiration pneumonia, respiratory distress, UTI.  Her hospital course got prolonged because of persistent respiratory symptoms and hypoxia despite completion of antibiotics course.  Pulmonary and GI consultations were obtained. Speech evaluation was obtained and patient was noted to have moderately profound dysphagia requiring nectar thick liquids which has limited her p.o. intake in both the caloric and free water aspects.  Given patient's poor nutritional status, ongoing aspiration, and concern for failure to thrive palliative care was consulted. PT eval was obtained.  Recommended SNF.  Subjective: Patient was seen and examined this morning and this afternoon.  Patient is somnolent at both occasions.  Per caregiver at bedside, patient woke up couple times in the interval and verbalize few words with family members.  I had a long conversation with her daughters and sisters at bedside this morning and this afternoon. In the last 24 hours, hemodynamically stable, breathing on 3 L oxygen Labs from this morning stable  Assessment and plan: Sepsis POA Acute on chronic respiratory failure with hypoxia POA Recurrent aspiration pneumonia, parapneumonic effusion Moderately profound oropharyngeal dysphagia Odynophagia H/o chronic pulmonary  blastomycosis, s/p lobectomy at [facility] 2014 Per history, patient had recurrent pneumonia and physical decline in the last 4 months. Admitted this time with yet another episode of aspiration pneumonia Sepsis physiology resolved with antibiotics but respiratory symptoms continued. Speech therapy eval patient was obtained Patient was noted to have moderately profound oropharyngeal dysphagia which probably was leading to recurrent aspiration pneumonia. Pulmonary, GI consulted.   GI recommended against EGD and started on empiric treatment with PPI, Carafate, fluconazole.    6/4, CT scan of head did not show any evidence of acute CVA that could explain dysphagia Currently only on dysphagia 1 diet. 6/4, chest x-ray showed pulmonary edema, bilateral pleural effusions, increased on the right, increased retrocardiac airspace opacity atelectasis or pneumonia 6/8, CT chest showed interval development of posterior left lower lobe collapse/consolidation, patchy airspace disease in the right lung base, new left pleural effusion with small to moderate right effusion with loculation. Pulmonologist Dr Thora Lance discussed options with the family including IR thoracentesis or pigtail or empirically treating parapneumonic effusion for 4 weeks antibiotic course.  Family chose prolonged antibiotic course.  IV Rocephin was started and recommended discharge on cefdinir suspension to complete 4-week course of antibiotics.   Outpatient follow-up with pulmonology in 4 to 5 weeks to repeat CXR Completed 14-day course of Diflucan for possible pharyngeal and esophageal candidiasis. At discharge, can continue PPI, Carafate, Magic mouthwash. While in the hospital, also diuresed with IV Lasix.  Recent echo from May was normal.  Currently patient has no oral intake and is at risk of dehydration.  I would avoid Lasix at this time.  Start D5 NS at 50 mill per hour   Hypokalemia Persistently low.  Currently on daily potassium replacement  with oral KCl packet. Recent Labs  Lab 10/24/22 0352 10/25/22 0719 10/25/22 1506 10/26/22 0345 10/27/22 0344 10/28/22 1253  K 3.0* 2.8*  --  3.1* 3.2* 3.3*  MG  --   --  1.8 1.8  --   --    Acute metabolic encephalopathy Per previous documentation, patient has been struggling with multiple episodes of PNA, UTI since February.  Has had multiple rounds of antibiotics with minimal improvement.  Patient is now fairly alert and oriented x 3, family at the bedside.  Generalized weakness PT eval obtained.  Recommended SNF.   Moderate malnutrition failure to thrive Secondary to chronic illness, poor appetite Nutritionist eval obtained. Palliative care consultation was obtained.   Urinary retention UA negative for UTI  Continue Flomax for 7 days   Goals of care   Code Status: DNR.   Palliative care consulted and signed off   DVT prophylaxis:  enoxaparin (LOVENOX) injection 40 mg Start: 10/11/22 2200 SCDs Start: 10/11/22 1132   Antimicrobials: IV Rocephin Fluid: D5 NS at 50 mill per hour Consultants: Pulmonary Family Communication: Multiple family members at bedside  Status: Inpatient Level of care:  Med-Surg   Patient from: Home Anticipated d/c to: SNF Needs to continue in-hospital care:  Somnolent, looks very weak.    Diet:  Diet Order             DIET - DYS 1 Room service appropriate? No; Fluid consistency: Nectar Thick  Diet effective now                   Scheduled Meds:  (feeding supplement) PROSource Plus  30 mL Oral TID BM   acetaminophen  650 mg Oral TID   acetaminophen  650 mg Oral Once   enoxaparin (LOVENOX) injection  40 mg Subcutaneous Q24H   feeding supplement (KATE [employer]AR[employer] STANDARD 1.4)  325 mL Oral BID BM   hydrocortisone  25 mg Rectal BID   ketotifen  1 drop Both Eyes BID   magic mouthwash  5 mL Oral QID   metoprolol succinate  12.5 mg Oral Daily   multivitamin with minerals  1 tablet Oral Daily   pantoprazole (PROTONIX) IV  40 mg  Intravenous Daily   polyethylene glycol  17 g Oral BID   potassium chloride  20 mEq Oral BID   sucralfate  1 g Oral TID WC & HS   tamsulosin  0.4 mg Oral Daily   thiamine  100 mg Oral Daily    PRN meds: acetaminophen, albuterol, food thickener, guaiFENesin-dextromethorphan, ondansetron (ZOFRAN) IV, phenylephrine-shark liver oil-mineral oil-petrolatum, witch hazel-glycerin   Infusions:   cefTRIAXone (ROCEPHIN)  IV 2 g (10/27/22 1625)   dextrose 5 % and 0.9 % NaCl      Antimicrobials: Anti-infectives (From admission, onward)    Start     Dose/Rate Route Frequency Ordered Stop   10/24/22 1600  cefTRIAXone (ROCEPHIN) 2 g in sodium chloride 0.9 % 100 mL IVPB        2 g 200 mL/hr over 30 Minutes Intravenous Every 24 hours 10/24/22 1500     10/13/22 1400  fluconazole (DIFLUCAN) IVPB 200 mg       See Hyperspace for full Linked Orders Report.   200 mg 100 mL/hr over 60 Minutes Intravenous Daily 10/12/22 1138 10/25/22 1621   10/13/22 1000  fluconazole (DIFLUCAN) IVPB 100 mg  Status:  Discontinued       See Hyperspace for full Linked Orders Report.   100 mg 50 mL/hr over 60 Minutes Intravenous Daily 10/12/22 1036 10/12/22  1138   10/12/22 1130  fluconazole (DIFLUCAN) IVPB 200 mg       See Hyperspace for full Linked Orders Report.   200 mg 100 mL/hr over 60 Minutes Intravenous  Once 10/12/22 1036 10/13/22 1114   10/12/22 1000  cefTRIAXone (ROCEPHIN) 2 g in sodium chloride 0.9 % 100 mL IVPB        2 g 200 mL/hr over 30 Minutes Intravenous Every 24 hours 10/11/22 1148 10/15/22 1327   10/12/22 1000  metroNIDAZOLE (FLAGYL) IVPB 500 mg        500 mg 100 mL/hr over 60 Minutes Intravenous Every 12 hours 10/11/22 1148 10/15/22 2255   10/11/22 1145  levofloxacin (LEVAQUIN) IVPB 500 mg  Status:  Discontinued        500 mg 100 mL/hr over 60 Minutes Intravenous Every 24 hours 10/11/22 1130 10/11/22 1146   10/11/22 0900  levofloxacin (LEVAQUIN) IVPB 750 mg        750 mg 100 mL/hr over 90 Minutes  Intravenous  Once 10/11/22 0858 10/11/22 1127       Nutritional status:  Body mass index is 32.27 kg/m.  Nutrition Problem: Moderate Malnutrition Etiology: chronic illness Signs/Symptoms: energy intake < 75% for > or equal to 1 month, percent weight loss (9.5% in 5 months) Percent weight loss: 9.5 % (in 5 months)     Objective: Vitals:   10/28/22 0434 10/28/22 1359  BP: 123/64 105/68  Pulse: 96 91  Resp: 17 17  Temp: 98.4 F (36.9 C) 99.1 F (37.3 C)  SpO2: 90% 100%    Intake/Output Summary (Last 24 hours) at 10/28/2022 1634 Last data filed at 10/28/2022 1300 Gross per 24 hour  Intake 0 ml  Output --  Net 0 ml   Filed Weights   10/11/22 0524  Weight: 90.7 kg   Weight change:  Body mass index is 32.27 kg/m.   Physical Exam: General exam: Elderly African-American female, not in physical distress Skin: No rashes, lesions or ulcers. HEENT: Atraumatic, normocephalic, no obvious bleeding Lungs: Diminished air entry in both bases CVS: Regular rate and rhythm, normal GI/Abd soft, distended from obesity, nontender, bowel sound present CNS: Somnolent, mumbles on touch.  Unable to answer orientation questions to me Psychiatry: Sad affect Extremities: No pedal edema, no calf tenderness  Data Review: I have personally reviewed the laboratory data and studies available.  F/u labs none Unresulted Labs (From admission, onward)    None       Total time spent in review of labs and imaging, patient evaluation, formulation of plan, documentation and communication with family: 55 minutes  Signed, Lorin Glass, MD [facility] 10/28/2022

## 2022-10-28 NOTE — Progress Notes (Signed)
Pt is not able to swallow pills/applesauce at this time. Pt is too sleepy and unable to follow nurses directions at this time. RN messaged provider, Dehal, to make aware. Dehal agrees to come see pt and family during rounds at approximately 11am.

## 2022-10-28 NOTE — Progress Notes (Signed)
Mobility Specialist - Progress Note   10/28/22 1001  Mobility  Activity  (ROM Exercises)  Level of Assistance Dependent, patient does less than 25%  Range of Motion/Exercises Passive;Left arm  Activity Response Tolerated fair  $Mobility charge 1 Mobility  Mobility Specialist Start Time (ACUTE ONLY) [id number]  Mobility Specialist Stop Time (ACUTE ONLY) 1000  Mobility Specialist Time Calculation (min) (ACUTE ONLY) 9 min   Pt received in bed sleeping. Assisted pt w/ some ROM exercises on L arm. Pt dependent throughout session. See below for exercises. Pt left in bed with NT & family in room.  Supine BUE Exercises: 5 reps each  1) Elbow Flexion (Passive)  2) Elbow extension (Passive)  3) Horizontal Abduction (Passive)  Chief Technology Officer

## 2022-10-28 NOTE — Progress Notes (Signed)
   10/28/22 1525  Spiritual Encounters  Type of Visit Initial  Care provided to: Family;Pt not available  Referral source Chaplain team  Reason for visit Routine spiritual support  OnCall Visit No   Chaplain attempted a visit with the patient, Barbara Nelson. At this time she is sleeping but I was able to visit with her family who were listening to music an being present for [facility]. They were having a good conversation and did not need support at this time. I wished them a peaceful day as I departed.   Valerie Roys [facility]  [phone]

## 2022-10-28 NOTE — Progress Notes (Signed)
Nutrition Follow-up  DOCUMENTATION CODES:   Non-severe (moderate) malnutrition in context of chronic illness  INTERVENTION:  - DYS 1 diet with necar thickened liquids per SLP. - Assist with feeding patient order to promote intake.  - Automatic house trays to ensure patient receives 3 meals a day.   - ProSource Plus TID, each supplement provides 100 kcal and 15 grams of protein - [employer] 1.4 PO BID, each supplement provides 455 kcal and 20 grams protein.  - Multivitamin with minerals daily to support micronutrient needs.   NUTRITION DIAGNOSIS:   Moderate Malnutrition related to chronic illness as evidenced by energy intake < 75% for > or equal to 1 month, percent weight loss (9.5% in 5 months). *ongoing  GOAL:   Patient will meet greater than or equal to 90% of their needs *unmet  MONITOR:   PO intake, Supplement acceptance, Diet advancement, Weight trends  REASON FOR ASSESSMENT:   Consult Calorie Count  ASSESSMENT:   82 y.o. female with PMH recurrent UTI since February with gradual physical decline, who presented with generalized weakness, poor oral intake, confusion, difficulty swallowing with choking and coughing. Admitted for aspiration pneumonia.  Patient sleeping at time of visit, several family members at bedside.   Daughter reports patient had been doing much better over the past few days. Was alert and talking, had been getting up to the chair. Was eating a lot better as well and holding her own drinks. She is documented to be consuming 0-45% of meals. Continues to drink ProSource Plus, getting around two a day. Doesn't like Magic Cup anymore, daughter would like discontinued. She had brought her in Gelatein (90kcal, 20g protein) and patient has been consuming. They are agreeable for her to try another supplement, would like dairy free is possible. Will trial [employer].  SLP following patient. MBS 6/10, recommended continued DYS 1 diet with nectar thickened  liquids.  However, daughter reports a medication was changed 1-2 days ago and now patient is sleeping a lot. Daughter plans to talk to MD about getting medication changed.   Medications reviewed and include: MVI, Thiamine, Lasix, Miralax, Carafate  Labs reviewed:  K+ 3.2 HA1C 6.9   Diet Order:   Diet Order             DIET - DYS 1 Room service appropriate? No; Fluid consistency: Nectar Thick  Diet effective now                   EDUCATION NEEDS:  Not appropriate for education at this time  Skin:  Skin Assessment: Reviewed RN Assessment Skin Integrity Issues:: Other (Comment) Other: Irritant Dermatitis (Moisture Associated Skin Damage) - Right buttocks  Last BM:  6/9  Height:  Ht Readings from Last 1 Encounters:  10/11/22 5\' 6"  (1.676 m)   Weight:  Wt Readings from Last 1 Encounters:  10/11/22 90.7 kg    BMI:  Body mass index is 32.27 kg/m.  Estimated Nutritional Needs:  Kcal:  1600-1800 kcals Protein:  75-90 grams Fluid:  >/= 1.5L    Shelle Iron RD, LDN For contact information, refer to [facility].

## 2022-10-29 DIAGNOSIS — J690 Pneumonitis due to inhalation of food and vomit: Secondary | ICD-10-CM | POA: Diagnosis not present

## 2022-10-29 MED ORDER — SUCRALFATE 1 GM/10ML PO SUSP: 1.0000 g | Freq: Three times a day (TID) | ORAL | Status: DC

## 2022-10-29 MED ORDER — CEFDINIR 250 MG/5ML PO SUSR
300.0000 mg | Freq: Two times a day (BID) | ORAL | Status: DC
  Administered 2022-10-29 – 2022-10-30 (×2): 300 mg via ORAL
  Filled 2022-10-29 (×3): qty 6

## 2022-10-29 MED ORDER — SUCRALFATE 1 GM/10ML PO SUSP
1.0000 g | Freq: Three times a day (TID) | ORAL | Status: DC
  Administered 2022-10-29 (×2): 1 g via ORAL
  Filled 2022-10-29 (×3): qty 10

## 2022-10-29 MED ORDER — ACETAMINOPHEN 160 MG/5ML PO SOLN: 650.0000 mg | Freq: Three times a day (TID) | ORAL | Status: DC | PRN

## 2022-10-29 NOTE — Plan of Care (Signed)
Problem: Education: Goal: Knowledge of General Education information will improve Description: Including pain rating scale, medication(s)/side effects and non-pharmacologic comfort measures Outcome: Progressing   Problem: Health Behavior/Discharge Planning: Goal: Ability to manage health-related needs will improve Outcome: Progressing   Problem: Clinical Measurements: Goal: Ability to maintain clinical measurements within normal limits will improve Outcome: Progressing Goal: Will remain free from infection Outcome: Progressing Goal: Respiratory complications will improve Outcome: Progressing Goal: Cardiovascular complication will be avoided Outcome: Progressing   Problem: Activity: Goal: Risk for activity intolerance will decrease Outcome: Progressing   Problem: Nutrition: Goal: Adequate nutrition will be maintained Outcome: Progressing   Problem: Coping: Goal: Level of anxiety will decrease Outcome: Progressing   Problem: Pain Managment: Goal: General experience of comfort will improve Outcome: Progressing   Problem: Safety: Goal: Ability to remain free from injury will improve Outcome: Progressing   Problem: Skin Integrity: Goal: Risk for impaired skin integrity will decrease Outcome: Progressing   

## 2022-10-29 NOTE — Progress Notes (Addendum)
Physical Therapy Treatment Patient Details Name: Barbara Nelson MRN: [id number] DOB: 20-Jun-1940 Today's Date: 10/29/2022   History of Present Illness Patient is a 82 year old female who presented with decreased urinary output, difficulty with mobility and swallowing. Patient was admitted with aspiration pneumonia, hyponatremia, PMH: HTN, recent UTI.    PT Comments    General Comments: Only brief moments of responsiveness.  99% eyes are closed.  Responded better to MD.  Appears to have "selected" response. Assisted to EOB was VERY difficult and py offered NO assistance.  General bed mobility comments: Total Assist + 2 pt 0% to transfer from supine to EOB to attempt to increase alertness.  Unsuccessful. Remains lethargic.  Eyes shut.  RN attempted to spoon feed pt apple sauce with meds unsuccessful.  Pt not opening her mouth.  Required Total Assist to prevent posterior LOB while seated EOB.  Poor collapsed posture and head downward.  Applied MAXI MOVE sling while seated. General transfer comment: pt is a MAXI MOVE transfer.  Positioned in recliner to comfort. Eyes remained closed.  Trial wean to RA was 85%.  Reapplied 2 lts nasal cannula.  Recommendations for follow up therapy are one component of a multi-disciplinary discharge planning process, led by the attending physician.  Recommendations may be updated based on patient status, additional functional criteria and insurance authorization.  Follow Up Recommendations  Can patient physically be transported by private vehicle: No    Assistance Recommended at Discharge Frequent or constant Supervision/Assistance  Patient can return home with the following Two people to help with walking and/or transfers;Two people to help with bathing/dressing/bathroom;Direct supervision/assist for medications management;Help with stairs or ramp for entrance;Assistance with cooking/housework;Assist for transportation;Assistance with feeding;Direct  supervision/assist for financial management   Equipment Recommendations  None recommended by PT    Recommendations for Other Services       Precautions / Restrictions Precautions Precautions: Fall Restrictions Weight Bearing Restrictions: No     Mobility  Bed Mobility Overal bed mobility: Needs Assistance Bed Mobility: Supine to Sit     Supine to sit: Total assist, +2 for physical assistance, +2 for safety/equipment (pt 0%)     General bed mobility comments: Total Assist + 2 pt 0% to transfer from supine to EOB to attempt to increase alertness.  Unsuccessful. Remains lethargic.  Eyes shut.  RN attempted to spoon feed pt apple sauce with meds unsuccessful.  Pt not opening her mouth.  Required Total Assist to prevent posterior LOB while seated EOB.  Poor collapsed posture and head downward.  Applied MAXI MOVE sling while seated.    Transfers                   General transfer comment: pt is a MAXI MOVE transfer    Ambulation/Gait               General Gait Details: pt is non amb   Social research officer, government [employer] (Stroke Patients Only)       Balance                                            Cognition Arousal/Alertness: Lethargic Behavior During Therapy: Flat affect  General Comments: Only brief moments of responsiveness.  99% eyes are closed.  Responded better to MD.  Appears to have "selected" response.        Exercises      General Comments        Pertinent Vitals/Pain Pain Assessment Pain Assessment: No/denies pain    Home Living                          Prior Function            PT Goals (current goals can now be found in the care plan section) Progress towards PT goals: Progressing toward goals    Frequency    Min 1X/week      PT Plan Current plan remains appropriate    Co-evaluation               AM-PAC PT "6 Clicks" Mobility   Outcome Measure  Help needed turning from your back to your side while in a flat bed without using bedrails?: Total Help needed moving from lying on your back to sitting on the side of a flat bed without using bedrails?: Total Help needed moving to and from a bed to a chair (including a wheelchair)?: Total Help needed standing up from a chair using your arms (e.g., wheelchair or bedside chair)?: Total Help needed to walk in hospital room?: Total Help needed climbing 3-5 steps with a railing? : Total 6 Click Score: 6    End of Session Equipment Utilized During Treatment: Gait belt;Oxygen Activity Tolerance: Patient limited by lethargy Patient left: in chair;with call bell/phone within reach;with family/visitor present Nurse Communication: Mobility status PT Visit Diagnosis: Other abnormalities of gait and mobility (R26.89);Muscle weakness (generalized) (M62.81)     Time: 0981-1914 PT Time Calculation (min) (ACUTE ONLY): 40 min  Charges:  $Therapeutic Activity: 38-52 mins                     {Linda Grimmer  PTA Acute  Rehabilitation Services Office M-F          [phone]

## 2022-10-29 NOTE — Progress Notes (Signed)
Speech Language Pathology Discharge Patient Details Name: Barbara Nelson MRN: [id number] DOB: 1940-10-10 Today's Date: 10/29/2022 Time:  -     Patient discharged from SLP services secondary to patient has made no progress toward goals in a reasonable time frame. Barbara Nelson is unfortunately not making progress with ability to eat due to ongoing dysphagia and mentation.  If pt improves with oral swallow function and/or consistently maintains alertness, SLP at next level may be helpful but at this time, SLP will discharge patient with MD approval.  Profound oral dysphagia continues despite pt's best efforts to swallow; This deficit was seen on 2 MBS studies - last being Monday, June 10th.   Please see latest therapy progress note for current level of functioning and progress toward goals.    Progress and discharge plan discussed with patient and/or caregiver:  Spoke with  MD who approved plan.    [name], [facility] SLP Acute Rehab Services Office [phone]   [name] 10/29/2022, 10:14 AM

## 2022-10-29 NOTE — Progress Notes (Addendum)
PROGRESS NOTE  Barbara Nelson  DOB: 01-May-1941  PCP: [name], MD ZOX:[id number]  DOA: 10/11/2022  LOS: 16 days  Hospital Day: 19  Brief narrative: Barbara Nelson is a 82 y.o. female with PMH significant for HTN, A-fib, h/o cardiac tamponade due to viral pericarditis, chronic pulmonary blastomycosis, s/p lobectomy at [facility] 2014, chronic respiratory failure on 2 L O2 via [place] who lives with her daughter.  For the last 4 months, patient has had recurrent pneumonia, UTI and physical decline. 5/26, patient was brought to the ED with generalized weakness, poor oral intake, confusion, difficulty swallowing with choking and coughing She was admitted with aspiration pneumonia, respiratory distress, UTI.  Her hospital course got prolonged because of persistent respiratory symptoms and hypoxia despite completion of antibiotics course.  Pulmonary and GI consultations were obtained. Speech evaluation was obtained and patient was noted to have moderately profound dysphagia requiring nectar thick liquids which has limited her p.o. intake in both the caloric and free water aspects.  Given patient's poor nutritional status, ongoing aspiration, and concern for failure to thrive palliative care was consulted. PT eval was obtained.  Recommended SNF.  Subjective: Patient was seen and examined this morning. Patient was briefly able to open eyes, smile and say some words for me. Daughters at bedside and on the phone. Patient was able to take few spoons of applesauce with medicines.  Assessment and plan: Sepsis POA Acute on chronic respiratory failure with hypoxia POA Recurrent aspiration pneumonia, parapneumonic effusion Moderately profound oropharyngeal dysphagia Odynophagia H/o chronic pulmonary blastomycosis, s/p lobectomy at [facility] 2014 Per history, patient had recurrent pneumonia and physical decline in the last 4 months. Admitted this time with yet another episode of aspiration  pneumonia Sepsis physiology resolved with antibiotics but respiratory symptoms continued. Speech therapy eval patient was obtained Patient was noted to have moderately profound oropharyngeal dysphagia which probably was leading to recurrent aspiration pneumonia. Pulmonary, GI consulted.   GI recommended against EGD and started on empiric treatment with PPI, Carafate, fluconazole.    6/4, CT scan of head did not show any evidence of acute CVA that could explain dysphagia Currently only on dysphagia 1 diet. 6/4, chest x-ray showed pulmonary edema, bilateral pleural effusions, increased on the right, increased retrocardiac airspace opacity atelectasis or pneumonia 6/8, CT chest showed interval development of posterior left lower lobe collapse/consolidation, patchy airspace disease in the right lung base, new left pleural effusion with small to moderate right effusion with loculation. Pulmonologist Dr Thora Lance discussed options with the family including IR thoracentesis or pigtail or empirically treating parapneumonic effusion for 4 weeks antibiotic course.  Family chose prolonged antibiotic course.  IV Rocephin was started and recommended discharge on cefdinir suspension to complete 4-week course of antibiotics.  Briefly awake today and able to take oral meds.  At the request of family, I switched her from IV Rocephin to oral cefdinir today. Outpatient follow-up with pulmonology in 4 to 5 weeks to repeat CXR Completed 14-day course of Diflucan for possible pharyngeal and esophageal candidiasis. At discharge, can continue PPI, Carafate, Magic mouthwash. While in the hospital, also diuresed with IV Lasix.  Recent echo from May was normal.  Currently patient has no oral intake and is at risk of dehydration.  I would avoid Lasix at this time.  Continue D5 NS at 50 mill per hour   Hypokalemia Persistently low.  Currently on daily potassium replacement with oral KCl packet. Recent Labs  Lab 10/24/22 0352  10/25/22 0719 10/25/22 1506 10/26/22  0345 10/27/22 0344 10/28/22 1253  K 3.0* 2.8*  --  3.1* 3.2* 3.3*  MG  --   --  1.8 1.8  --   --    Acute metabolic encephalopathy Per previous documentation, patient has been struggling with multiple episodes of PNA, UTI since February.  Has had multiple rounds of antibiotics with minimal improvement.  Patient is now fairly alert and oriented x 3, family at the bedside.  Generalized weakness PT eval obtained.  Recommended SNF.   Severe protein calorie malnutrition failure to thrive Secondary to chronic illness, poor appetite Nutritionist eval obtained. Palliative care consultation was obtained.   Urinary retention UA negative for UTI  Continue Flomax for 7 days   Goals of care   Code Status: DNR.   Palliative care consulted and signed off   DVT prophylaxis:  enoxaparin (LOVENOX) injection 40 mg Start: 10/11/22 2200 SCDs Start: 10/11/22 1132   Antimicrobials: IV Rocephin Fluid: D5 NS at 50 mill per hour Consultants: Pulmonary Family Communication: Multiple family members at bedside  Status: Inpatient Level of care:  Med-Surg   Patient from: Home Anticipated d/c to: SNF Needs to continue in-hospital care:  Mental status waxing and waning.  Briefly awake today    Diet:  Diet Order             DIET - DYS 1 Room service appropriate? No; Fluid consistency: Nectar Thick  Diet effective now                   Scheduled Meds:  (feeding supplement) PROSource Plus  30 mL Oral TID BM   acetaminophen  650 mg Oral TID   acetaminophen  650 mg Oral Once   cefdinir  300 mg Oral BID   enoxaparin (LOVENOX) injection  40 mg Subcutaneous Q24H   feeding supplement (KATE FARMS STANDARD 1.4)  325 mL Oral BID BM   hydrocortisone  25 mg Rectal BID   ketotifen  1 drop Both Eyes BID   magic mouthwash  5 mL Oral QID   metoprolol succinate  12.5 mg Oral Daily   multivitamin with minerals  1 tablet Oral Daily   pantoprazole (PROTONIX)  IV  40 mg Intravenous Daily   polyethylene glycol  17 g Oral BID   potassium chloride  20 mEq Oral BID   sucralfate  1 g Oral TID WC & HS   tamsulosin  0.4 mg Oral Daily   thiamine  100 mg Oral Daily    PRN meds: acetaminophen (TYLENOL) oral liquid 160 mg/5 mL, acetaminophen, albuterol, food thickener, guaiFENesin-dextromethorphan, ondansetron (ZOFRAN) IV, phenylephrine-shark liver oil-mineral oil-petrolatum, witch hazel-glycerin   Infusions:   dextrose 5 % and 0.9 % NaCl 50 mL/hr at 10/28/22 1704    Antimicrobials: Anti-infectives (From admission, onward)    Start     Dose/Rate Route Frequency Ordered Stop   10/29/22 1700  cefdinir (OMNICEF) 250 MG/5ML suspension 300 mg        300 mg Oral 2 times daily 10/29/22 1251     10/24/22 1600  cefTRIAXone (ROCEPHIN) 2 g in sodium chloride 0.9 % 100 mL IVPB  Status:  Discontinued        2 g 200 mL/hr over 30 Minutes Intravenous Every 24 hours 10/24/22 1500 10/29/22 1251   10/13/22 1400  fluconazole (DIFLUCAN) IVPB 200 mg       See Hyperspace for full Linked Orders Report.   200 mg 100 mL/hr over 60 Minutes Intravenous Daily 10/12/22 1138 10/25/22 1621  10/13/22 1000  fluconazole (DIFLUCAN) IVPB 100 mg  Status:  Discontinued       See Hyperspace for full Linked Orders Report.   100 mg 50 mL/hr over 60 Minutes Intravenous Daily 10/12/22 1036 10/12/22 1138   10/12/22 1130  fluconazole (DIFLUCAN) IVPB 200 mg       See Hyperspace for full Linked Orders Report.   200 mg 100 mL/hr over 60 Minutes Intravenous  Once 10/12/22 1036 10/13/22 1114   10/12/22 1000  cefTRIAXone (ROCEPHIN) 2 g in sodium chloride 0.9 % 100 mL IVPB        2 g 200 mL/hr over 30 Minutes Intravenous Every 24 hours 10/11/22 1148 10/15/22 1327   10/12/22 1000  metroNIDAZOLE (FLAGYL) IVPB 500 mg        500 mg 100 mL/hr over 60 Minutes Intravenous Every 12 hours 10/11/22 1148 10/15/22 2255   10/11/22 1145  levofloxacin (LEVAQUIN) IVPB 500 mg  Status:  Discontinued         500 mg 100 mL/hr over 60 Minutes Intravenous Every 24 hours 10/11/22 1130 10/11/22 1146   10/11/22 0900  levofloxacin (LEVAQUIN) IVPB 750 mg        750 mg 100 mL/hr over 90 Minutes Intravenous  Once 10/11/22 0858 10/11/22 1127       Nutritional status:  Body mass index is 32.27 kg/m.  Nutrition Problem: Moderate Malnutrition Etiology: chronic illness Signs/Symptoms: energy intake < 75% for > or equal to 1 month, percent weight loss (9.5% in 5 months) Percent weight loss: 9.5 % (in 5 months)     Objective: Vitals:   10/29/22 0622 10/29/22 1321  BP: 134/68 136/76  Pulse: 75 87  Resp: 17 18  Temp: 97.9 F (36.6 C) 97.8 F (36.6 C)  SpO2: 100% 100%    Intake/Output Summary (Last 24 hours) at 10/29/2022 1324 Last data filed at 10/29/2022 0754 Gross per 24 hour  Intake 697.72 ml  Output --  Net 697.72 ml   Filed Weights   10/11/22 0524  Weight: 90.7 kg   Weight change:  Body mass index is 32.27 kg/m.   Physical Exam: General exam: Elderly African-American female, not in physical distress Skin: No rashes, lesions or ulcers. HEENT: Atraumatic, normocephalic, no obvious bleeding Lungs: Diminished air entry in both bases CVS: Regular rate and rhythm, normal GI/Abd soft, distended from obesity, nontender, bowel sound present CNS: Opens eyes and smiles.  Briefly awake today.  Unable to answer questions though. Psychiatry: Sad affect Extremities: No pedal edema, no calf tenderness  Data Review: I have personally reviewed the laboratory data and studies available.  F/u labs none Unresulted Labs (From admission, onward)    None       Total time spent in review of labs and imaging, patient evaluation, formulation of plan, documentation and communication with family: 45 minutes  Signed, Lorin Glass, MD [facility] 10/29/2022

## 2022-10-29 NOTE — Consult Note (Addendum)
WOC Nurse Consult Note: Reason for Consult: Consult requested for buttocks.  Daughter at bedside to assess skin appearance and discuss plan of care. Sacrum with intact skin. Pt is frequently incontinent of urine and it is difficult to keep the affected areas from becoming moist. Bilat buttocks and inner upper posterior thighs with pink moist macerated skin and patchy areas of partial thickness skin loss; appearance is consistent with moisture associated skin damage, NOT a pressure injury.  Each partial thickness area is approx .1X.1X.1cm, pink and moist.  ICD-10 CM Codes for Irritant Dermatitis L24A2 - Due to fecal, urinary or dual incontinence Dressing procedure/placement/frequency: Topical treatment orders provided for bedside nurses to perform as follows to protect skin and repel moisture and protect from further breakdown: Apply Calmoseptine barrier cream (already in the room) BID or PRN when turning or cleaning to bilat buttocks.  Leave foam dressings off since they are trapping moisture underneath.   Please re-consult if further assistance is needed.  Thank-you,  Cammie Mcgee MSN, RN, CWOCN, [place], CNS [phone]

## 2022-10-29 NOTE — TOC Progression Note (Signed)
Transition of Care [facility]) - Progression Note   Patient Details  Name: Barbara Nelson MRN: [id number] Date of Birth: Jun 06, 1940  [facility] CM/SW Contact  Ewing Schlein, LCSW Phone Number: 10/29/2022, 2:27 PM  Clinical Narrative: CSW met with patient and family to discuss discharge plan to SNF. Patient is expected to be medically ready for SNF tomorrow. CSW updated [place] in admissions at [place].  Expected Discharge Plan: Home w Home Health Services Barriers to Discharge: Continued Medical Work up  Expected Discharge Plan and Services In-house Referral: Clinical Social Work Post Acute Care Choice: Home Health Living arrangements for the past 2 months: Single Family Home           DME Arranged: N/A DME Agency: NA HH Arranged: PT, OT HH Agency: [employer]  Social Determinants of Health (SDOH) Interventions SDOH Screenings   Food Insecurity: No Food Insecurity (10/11/2022)  Housing: Low Risk  (10/11/2022)  Transportation Needs: No Transportation Needs (10/11/2022)  Utilities: Not At Risk (10/11/2022)  Tobacco Use: Medium Risk (10/11/2022)   Readmission Risk Interventions     No data to display

## 2022-10-30 ENCOUNTER — Encounter: Payer: Medicare Other | Admitting: Dietician

## 2022-10-30 DIAGNOSIS — R4182 Altered mental status, unspecified: Secondary | ICD-10-CM | POA: Insufficient documentation

## 2022-10-30 DIAGNOSIS — J690 Pneumonitis due to inhalation of food and vomit: Secondary | ICD-10-CM | POA: Diagnosis not present

## 2022-10-30 MED ORDER — PANTOPRAZOLE SODIUM 40 MG PO PACK: 40.0000 mg | PACK | Freq: Every day | ORAL | Status: DC

## 2022-10-30 MED ORDER — POTASSIUM CHLORIDE 20 MEQ PO PACK: 20.0000 meq | PACK | Freq: Two times a day (BID) | ORAL | Status: DC

## 2022-10-30 MED ORDER — GUAIFENESIN-DM 100-10 MG/5ML PO SYRP: 5.0000 mL | ORAL_SOLUTION | ORAL | 0 refills | Status: DC | PRN

## 2022-10-30 MED ORDER — PROSOURCE PLUS PO LIQD: 30.0000 mL | Freq: Three times a day (TID) | ORAL | Status: DC

## 2022-10-30 MED ORDER — CEFDINIR 250 MG/5ML PO SUSR: 300.0000 mg | Freq: Two times a day (BID) | ORAL | 0 refills | Status: DC

## 2022-10-30 MED ORDER — WITCH HAZEL-GLYCERIN EX PADS: MEDICATED_PAD | CUTANEOUS | 12 refills | Status: DC | PRN

## 2022-10-30 MED ORDER — SUCRALFATE 1 GM/10ML PO SUSP: 1.0000 g | Freq: Three times a day (TID) | ORAL | 0 refills | Status: DC

## 2022-10-30 MED ORDER — POLYETHYLENE GLYCOL 3350 17 GM/SCOOP PO POWD: 17.0000 g | Freq: Two times a day (BID) | ORAL | 0 refills | Status: DC | PRN

## 2022-10-30 MED ORDER — HYDROCORTISONE ACETATE 25 MG RE SUPP: 25.0000 mg | Freq: Two times a day (BID) | RECTAL | 0 refills | Status: DC

## 2022-10-30 MED ORDER — FOOD THICKENER (SIMPLYTHICK): 5.0000 | ORAL | Status: DC | PRN

## 2022-10-30 MED ORDER — KATE FARMS STANDARD 1.4 PO LIQD: 325.0000 mL | Freq: Two times a day (BID) | ORAL | Status: DC

## 2022-10-30 MED ORDER — MAGIC MOUTHWASH: 5.0000 mL | Freq: Four times a day (QID) | ORAL | 0 refills | Status: DC

## 2022-10-30 NOTE — Plan of Care (Signed)

## 2022-10-30 NOTE — Discharge Summary (Signed)
Physician Discharge Summary  Barbara Nelson YQM:[id number] DOB: 1940-06-20 DOA: 10/11/2022  PCP: [name], MD  Admit date: 10/11/2022 Discharge date: 10/30/2022  Admitted From: home Discharge disposition: SNF  Recommendations at discharge:  Continue antibiotics to complete 4 weeks course.  Follow-up with pulmonology after 4 weeks for repeat x-ray. Follow-up with speech therapy as an outpatient   Brief narrative: Barbara Nelson is a 82 y.o. female with PMH significant for HTN, A-fib, h/o cardiac tamponade due to viral pericarditis, chronic pulmonary blastomycosis, s/p lobectomy at [facility] 2014, chronic respiratory failure on 2 L O2 via [place] who lives with her daughter.  For the last 4 months, patient has had recurrent pneumonia, UTI and physical decline. 5/26, patient was brought to the ED with generalized weakness, poor oral intake, confusion, difficulty swallowing with choking and coughing She was admitted with aspiration pneumonia, respiratory distress, UTI.  Her hospital course got prolonged because of persistent respiratory symptoms and hypoxia despite completion of antibiotics course.  Pulmonary and GI consultations were obtained. Speech evaluation was obtained and patient was noted to have moderately profound dysphagia requiring nectar thick liquids which has limited her p.o. intake in both the caloric and free water aspects.  Given patient's poor nutritional status, ongoing aspiration, and concern for failure to thrive palliative care was consulted. PT eval was obtained.  Recommended SNF.  Subjective: Patient was seen and examined this morning. Open eyes on verbal command.  Smiled for me.  Unable to answer questions to me.  Per family, she has been intermittently responding to family. I had a long conversation with patient's daughters on the phone as well as at bedside. Patient is medically stable for discharge to SNF today  Assessment and plan: Sepsis POA Acute on  chronic respiratory failure with hypoxia POA Recurrent aspiration pneumonia, parapneumonic effusion Moderately profound oropharyngeal dysphagia Odynophagia H/o chronic pulmonary blastomycosis, s/p lobectomy at [facility] 2014 Per history, patient had recurrent pneumonia and physical decline in the last 4 months. Admitted this time with yet another episode of aspiration pneumonia Sepsis physiology resolved with antibiotics but respiratory symptoms continued. Speech therapy eval patient was obtained Patient was noted to have moderately profound oropharyngeal dysphagia which probably was leading to recurrent aspiration pneumonia. Pulmonary, GI consulted.   GI recommended against EGD and started on empiric treatment with PPI, Carafate, fluconazole.    6/4, CT scan of head did not show any evidence of acute CVA that could explain dysphagia Currently only on dysphagia 1 diet. 6/4, chest x-ray showed pulmonary edema, bilateral pleural effusions, increased on the right, increased retrocardiac airspace opacity atelectasis or pneumonia 6/8, CT chest showed interval development of posterior left lower lobe collapse/consolidation, patchy airspace disease in the right lung base, new left pleural effusion with small to moderate right effusion with loculation. Pulmonologist Dr Thora Lance discussed options with the family including IR thoracentesis or pigtail or empirically treating parapneumonic effusion for 4 weeks antibiotic course.  Family chose prolonged antibiotic course.  IV Rocephin was started and recommended discharge on cefdinir suspension to complete 4-week course of antibiotics.  Briefly awake today and able to take oral meds.  At the request of family, I switched her from IV Rocephin to oral cefdinir. Outpatient follow-up with pulmonology in 4 to 5 weeks to repeat CXR Completed 14-day course of Diflucan for possible pharyngeal and esophageal candidiasis. At discharge, can continue PPI, Carafate, Magic  mouthwash. While in the hospital, also diuresed with IV Lasix.  Recent echo from May was normal.  Currently  patient has no oral intake and is at risk of dehydration.  I would avoid Lasix at this time.    Hypokalemia Persistently low.  Currently on daily potassium replacement with oral KCl packet. Recent Labs  Lab 10/24/22 0352 10/25/22 0719 10/25/22 1506 10/26/22 0345 10/27/22 0344 10/28/22 1253  K 3.0* 2.8*  --  3.1* 3.2* 3.3*  MG  --   --  1.8 1.8  --   --    Acute metabolic encephalopathy Per previous documentation, patient has been struggling with multiple episodes of PNA, UTI since February.  Has had multiple rounds of antibiotics with minimal improvement.  Patient is now alert, awake, able to smile but responds only intermittently.  Generalized weakness PT eval obtained.  Recommended SNF.   Severe protein calorie malnutrition failure to thrive Secondary to chronic illness, poor appetite Nutritionist eval obtained. Palliative care consultation was obtained.   Urinary retention UA negative for UTI  Completed 7-day course of Flomax.   Goals of care   Code Status: DNR.   Palliative care consulted and signed off  Wounds:  - Wound / Incision (Open or Dehisced) 10/13/22 Irritant Dermatitis (Moisture Associated Skin Damage) Buttocks Right;Left pink (Active)  Date First Assessed/Time First Assessed: (c) 10/13/22 0840   Wound Type: Irritant Dermatitis (Moisture Associated Skin Damage)  Location: Buttocks  Location Orientation: Right;Left  Wound Description (Comments): pink  Present on Admission: (c)     Assessments 10/13/2022  8:40 AM 10/30/2022 10:55 AM  Dressing Type -- Foam - Lift dressing to assess site every shift  Dressing Status -- Clean, Dry, Intact  Dressing Change Frequency -- PRN  Site / Wound Assessment Pink Clean;Dry  Wound Length (cm) 10 cm --  Wound Width (cm) 12 cm --  Wound Surface Area (cm^2) 120 cm^2 --     No associated orders.     Wound / Incision  (Open or Dehisced) 10/29/22 (IAD) Incontinence Associated Dermatitis Buttocks Right;Left (Active)  Date First Assessed: 10/29/22   Wound Type: (IAD) Incontinence Associated Dermatitis  Location: Buttocks  Location Orientation: Right;Left    Assessments 10/29/2022  1:00 PM 10/29/2022  1:18 PM  % Wound base Red or Granulating 100% --  Wound Length (cm) -- 1 cm  Wound Width (cm) -- 1 cm  Wound Depth (cm) -- 1 cm  Wound Volume (cm^3) -- 1 cm^3  Wound Surface Area (cm^2) -- 1 cm^2     No associated orders.    Discharge Exam:   Vitals:   10/29/22 1321 10/29/22 1513 10/29/22 2143 10/30/22 0632  BP: 136/76 (!) 146/83 122/80 136/66  Pulse: 87 85 85 93  Resp: 18  17 19   Temp: 97.8 F (36.6 C)  97.8 F (36.6 C) 98.4 F (36.9 C)  TempSrc: Oral  Oral Oral  SpO2: 100% 93% 94% 93%  Weight:      Height:        Body mass index is 32.27 kg/m.   General exam: Elderly African-American female, not in physical distress Skin: No rashes, lesions or ulcers. HEENT: Atraumatic, normocephalic, no obvious bleeding Lungs: Diminished air entry in both bases CVS: Regular rate and rhythm, normal GI/Abd soft, distended from obesity, nontender, bowel sound present CNS: Opens eyes and smiles.  Briefly awake today.  Intermittently answers questions.   Psychiatry: Sad affect Extremities: No pedal edema, no calf tenderness  Follow ups:    Follow-up Information     [facility] Follow up.   Why: [facility] will provide PT and  OT in the home after discharge.        [name], MD Follow up.   Specialty: Family Medicine Contact information: [address] [place] [address] [phone]         [name], MD Follow up.   Specialty: Family Medicine Contact information: [address] [place] [address] [phone]                 Discharge Instructions:   Discharge Instructions     Ambulatory referral to Neurology   Complete by: As  directed    Altered mental status.  Gradually improving.  Workup nonrevealing so far.  Family would like an outpatient referral to neurology.   Ambulatory referral to Speech Therapy   Complete by: As directed    Has significant oropharyngeal dysphagia.  Family would like a follow-up.   Call MD for:  difficulty breathing, headache or visual disturbances   Complete by: As directed    Call MD for:  extreme fatigue   Complete by: As directed    Call MD for:  hives   Complete by: As directed    Call MD for:  persistant dizziness or light-headedness   Complete by: As directed    Call MD for:  persistant nausea and vomiting   Complete by: As directed    Call MD for:  severe uncontrolled pain   Complete by: As directed    Call MD for:  temperature >100.4   Complete by: As directed    Diet general   Complete by: As directed    Dysphagia 1 diet.   Discharge instructions   Complete by: As directed    Recommendations at discharge:   Continue antibiotics to complete 4 weeks course.  Follow-up with pulmonology after 4 weeks for repeat x-ray.  Follow-up with speech therapy as an outpatient  General discharge instructions: Follow with Primary MD [name], MD in 7 days  Please request your PCP  to go over your hospital tests, procedures, radiology results at the follow up. Please get your medicines reviewed and adjusted.  Your PCP may decide to repeat certain labs or tests as needed. Do not drive, operate heavy machinery, perform activities at heights, swimming or participation in water activities or provide baby sitting services if your were admitted for syncope or siezures until you have seen by Primary MD or a Neurologist and advised to do so again. [place] Controlled Substance Reporting System database was reviewed. Do not drive, operate heavy machinery, perform activities at heights, swim, participate in water activities or provide baby-sitting services while on medications for  pain, sleep and mood until your outpatient physician has reevaluated you and advised to do so again.  You are strongly recommended to comply with the dose, frequency and duration of prescribed medications. Activity: As tolerated with Full fall precautions use walker/cane & assistance as needed Avoid using any recreational substances like cigarette, tobacco, alcohol, or non-prescribed drug. If you experience worsening of your admission symptoms, develop shortness of breath, life threatening emergency, suicidal or homicidal thoughts you must seek medical attention immediately by calling 911 or calling your MD immediately  if symptoms less severe. You must read complete instructions/literature along with all the possible adverse reactions/side effects for all the medicines you take and that have been prescribed to you. Take any new medicine only after you have completely understood and accepted all the possible adverse reactions/side effects.  Wear Seat belts while driving. You were cared for  by a hospitalist during your hospital stay. If you have any questions about your discharge medications or the care you received while you were in the hospital after you are discharged, you can call the unit and ask to speak with the hospitalist or the covering physician. Once you are discharged, your primary care physician will handle any further medical issues. Please note that NO REFILLS for any discharge medications will be authorized once you are discharged, as it is imperative that you return to your primary care physician (or establish a relationship with a primary care physician if you do not have one).   Discharge wound care:   Complete by: As directed    Increase activity slowly   Complete by: As directed        Discharge Medications:   Allergies as of 10/30/2022       Reactions   Latex Rash   Penicillins Rash, Itching   Tape Hives, Rash   Z-pak [azithromycin] Rash, Other (See Comments)   Mouth  sores   Dairycare [lactase-lactobacillus] Other (See Comments)   intolerance   Egg-derived Products Nausea And Vomiting   Potassium Nausea And Vomiting   Amoxil [amoxicillin] Rash   Sulfa Antibiotics Rash        Medication List     STOP taking these medications    benzonatate 100 MG capsule Commonly known as: TESSALON   ciprofloxacin 500 MG tablet Commonly known as: CIPRO   doxycycline 100 MG capsule Commonly known as: VIBRAMYCIN   fluconazole 100 MG tablet Commonly known as: DIFLUCAN   fosfomycin 3 g Pack Commonly known as: MONUROL   furosemide 20 MG tablet Commonly known as: Lasix   hydrochlorothiazide 12.5 MG tablet Commonly known as: HYDRODIURIL   nitrofurantoin (macrocrystal-monohydrate) 100 MG capsule Commonly known as: MACROBID   nitrofurantoin 100 MG capsule Commonly known as: MACRODANTIN   nystatin 100000 UNIT/ML suspension Commonly known as: MYCOSTATIN       TAKE these medications    (feeding supplement) PROSource Plus liquid Take 30 mLs by mouth 3 (three) times daily between meals.   feeding supplement (KATE FARMS STANDARD 1.4) Liqd liquid Take 325 mLs by mouth 2 (two) times daily between meals.   acetaminophen 325 MG tablet Commonly known as: TYLENOL Take 650 mg by mouth every 6 (six) hours as needed for mild pain or moderate pain.   azelastine 0.05 % ophthalmic solution Commonly known as: OPTIVAR Apply 1 drop to eye 2 (two) times daily.   cefdinir 250 MG/5ML suspension Commonly known as: OMNICEF Take 6 mLs (300 mg total) by mouth 2 (two) times daily for 28 days.   food thickener Gel Commonly known as: SIMPLYTHICK (NECTAR/LEVEL 2/MILDLY THICK) Take 5 packets by mouth as needed.   guaiFENesin-dextromethorphan 100-10 MG/5ML syrup Commonly known as: ROBITUSSIN DM Take 5 mLs by mouth every 4 (four) hours as needed for cough.   hydrocortisone 25 MG suppository Commonly known as: ANUSOL-HC Place 1 suppository (25 mg total) rectally  2 (two) times daily.   magic mouthwash Soln Take 5 mLs by mouth 4 (four) times daily.   metoprolol succinate 25 MG 24 hr tablet Commonly known as: TOPROL-XL Take 0.5 tablets (12.5 mg total) by mouth daily. What changed: when to take this   multivitamin with minerals Tabs tablet Take 1 tablet by mouth daily.   pantoprazole sodium 40 mg Commonly known as: PROTONIX Place 40 mg into feeding tube daily.   Pataday 0.1 % ophthalmic solution Generic drug: olopatadine Place 1 drop into  both eyes daily as needed for allergies.   polyethylene glycol powder 17 GM/SCOOP powder Commonly known as: MiraLax Take 17 g by mouth 2 (two) times daily as needed. Decrease to once daily if having watery stools. What changed:  when to take this reasons to take this   potassium chloride 20 MEQ packet Commonly known as: KLOR-CON Take 20 mEq by mouth 2 (two) times daily.   sucralfate 1 GM/10ML suspension Commonly known as: CARAFATE Take 10 mLs (1 g total) by mouth 4 (four) times daily -  with meals and at bedtime.   triamcinolone 0.025 % ointment Commonly known as: KENALOG Apply 1 Application topically 2 (two) times daily. Upper back, arms, legs   witch hazel-glycerin pad Commonly known as: TUCKS Apply topically as needed for itching.               Discharge Care Instructions  (From admission, onward)           Start     Ordered   10/30/22 0000  Discharge wound care:        10/30/22 1114             The results of significant diagnostics from this hospitalization (including imaging, microbiology, ancillary and laboratory) are listed below for reference.    Procedures and Diagnostic Studies:   DG Chest Port 1 View  Result Date: 10/11/2022 CLINICAL DATA:  Altered mental status. EXAM: PORTABLE CHEST 1 VIEW COMPARISON:  10/01/2022 FINDINGS: The cardio pericardial silhouette is enlarged. There is pulmonary vascular congestion without overt pulmonary edema. Surgical staple  line noted right mid lung with adjacent nodular opacity, potentially scarring. Bibasilar atelectasis or infiltrate evident without substantial pleural effusion bones are diffusely demineralized. IMPRESSION: 1. Enlargement of the cardiopericardial silhouette with pulmonary vascular congestion. 2. Bibasilar atelectasis or infiltrate. 3. Surgical staple line right mid lung with associated nodular opacity. This could reflect scarring but the patient has a history of lung cancer, close follow-up for local recurrence recommended. Electronically Signed   By: Kennith Center M.D.   On: 10/11/2022 08:34   CT HEAD WO CONTRAST  Result Date: 10/11/2022 CLINICAL DATA:  Mental status changes. EXAM: CT HEAD WITHOUT CONTRAST TECHNIQUE: Contiguous axial images were obtained from the base of the skull through the vertex without intravenous contrast. RADIATION DOSE REDUCTION: This exam was performed according to the departmental dose-optimization program which includes automated exposure control, adjustment of the mA and/or kV according to patient size and/or use of iterative reconstruction technique. COMPARISON:  06/30/2022 FINDINGS: Brain: There is no evidence for acute hemorrhage, hydrocephalus, mass lesion, or abnormal extra-axial fluid collection. No definite CT evidence for acute infarction. Patchy low attenuation in the deep hemispheric and periventricular white matter is nonspecific, but likely reflects chronic microvascular ischemic demyelination. Vascular: No hyperdense vessel or unexpected calcification. Skull: No evidence for fracture. No worrisome lytic or sclerotic lesion. Sinuses/Orbits: The visualized paranasal sinuses and mastoid air cells are clear. Visualized portions of the globes and intraorbital fat are unremarkable. Other: None IMPRESSION: 1. No acute intracranial abnormality. 2. Chronic small vessel ischemic disease. Electronically Signed   By: Kennith Center M.D.   On: 10/11/2022 06:32     Labs:   Basic  Metabolic Panel: Recent Labs  Lab 10/24/22 0352 10/25/22 0719 10/25/22 1506 10/26/22 0345 10/27/22 0344 10/28/22 1253  NA 138 141  --  141 141 141  K 3.0* 2.8*  --  3.1* 3.2* 3.3*  CL 99 96*  --  101 102 104  CO2 31 34*  --  33* 34* 30  GLUCOSE 96 88  --  100* 88 77  BUN 9 9  --  11 12 12   CREATININE 0.58 0.63  --  0.63 0.57 0.67  CALCIUM 8.2* 8.4*  --  8.3* 8.2* 8.4*  MG  --   --  1.8 1.8  --   --    GFR Estimated Creatinine Clearance: 61.5 mL/min (by C-G formula based on SCr of 0.67 mg/dL). Liver Function Tests: No results for input(s): "AST", "ALT", "ALKPHOS", "BILITOT", "PROT", "ALBUMIN" in the last 168 hours. No results for input(s): "LIPASE", "AMYLASE" in the last 168 hours. No results for input(s): "AMMONIA" in the last 168 hours. Coagulation profile No results for input(s): "INR", "PROTIME" in the last 168 hours.  CBC: Recent Labs  Lab 10/24/22 0352 10/25/22 0719 10/26/22 0345 10/27/22 0344 10/28/22 1253  WBC 5.3 4.9 4.9 5.1 6.8  HGB 10.0* 11.7* 11.0* 10.4* 10.7*  HCT 29.7* 35.2* 32.6* 31.0* 33.3*  MCV 84.9 85.6 84.0 84.2 88.6  PLT 140* 173 177 170 172   Cardiac Enzymes: No results for input(s): "CKTOTAL", "CKMB", "CKMBINDEX", "TROPONINI" in the last 168 hours. BNP: Invalid input(s): "POCBNP" CBG: No results for input(s): "GLUCAP" in the last 168 hours. D-Dimer No results for input(s): "DDIMER" in the last 72 hours. Hgb A1c No results for input(s): "HGBA1C" in the last 72 hours. Lipid Profile No results for input(s): "CHOL", "HDL", "LDLCALC", "TRIG", "CHOLHDL", "LDLDIRECT" in the last 72 hours. Thyroid function studies No results for input(s): "TSH", "T4TOTAL", "T3FREE", "THYROIDAB" in the last 72 hours.  Invalid input(s): "FREET3" Anemia work up No results for input(s): "VITAMINB12", "FOLATE", "FERRITIN", "TIBC", "IRON", "RETICCTPCT" in the last 72 hours. Microbiology No results found for this or any previous visit (from the past 240  hour(s)).  Time coordinating discharge: 45 minutes  Signed: Dell Hurtubise  [facility] 10/30/2022, 11:14 AM

## 2022-10-30 NOTE — TOC Transition Note (Signed)
Transition of Care [facility]) - CM/SW Discharge Note  Patient Details  Name: Barbara Nelson MRN: [id number] Date of Birth: 1940/08/06  [facility]) CM/SW Contact:  Ewing Schlein, LCSW Phone Number: 10/30/2022, 12:49 PM  Clinical Narrative: Patient is medically stable to discharge to [facility]. Patient will go to room 601P and the number for report is [phone]. Discharge summary, discharge orders, and SNF transfer report faxed to facility in hub. Medical necessity form done; PTAR scheduled. Discharge packet completed. CSW updated daughter, Barbara Nelson. LPN updated. TOC signing off.   Final next level of care: Skilled Nursing Facility Barriers to Discharge: Barriers Resolved  Patient Goals and CMS Choice CMS Medicare.gov Compare Post Acute Care list provided to:: Patient Choice offered to / list presented to : Patient, Adult Children  Discharge Placement Existing PASRR number confirmed : 10/19/22          Patient chooses bed at: [facility] Patient to be transferred to facility by: PTAR Name of family member notified: Barbara Nelson (daughter) Patient and family notified of of transfer: 10/30/22  Discharge Plan and Services Additional resources added to the After Visit Summary for   In-house Referral: Clinical Social Work Post Acute Care Choice: Home Health          DME Arranged: N/A DME Agency: NA HH Arranged: PT, OT HH Agency: [facility]  Social Determinants of Health (SDOH) Interventions SDOH Screenings   Food Insecurity: No Food Insecurity (10/11/2022)  Housing: Low Risk  (10/11/2022)  Transportation Needs: No Transportation Needs (10/11/2022)  Utilities: Not At Risk (10/11/2022)  Tobacco Use: Medium Risk (10/11/2022)   Readmission Risk Interventions     No data to display

## 2022-10-30 NOTE — Progress Notes (Signed)
Report given to receiving nurse at [facility].

## 2022-11-02 DIAGNOSIS — E440 Moderate protein-calorie malnutrition: Secondary | ICD-10-CM | POA: Diagnosis not present

## 2022-11-02 DIAGNOSIS — R339 Retention of urine, unspecified: Secondary | ICD-10-CM | POA: Diagnosis not present

## 2022-11-02 DIAGNOSIS — I10 Essential (primary) hypertension: Secondary | ICD-10-CM

## 2022-11-02 DIAGNOSIS — E871 Hypo-osmolality and hyponatremia: Secondary | ICD-10-CM

## 2022-11-02 DIAGNOSIS — J309 Allergic rhinitis, unspecified: Secondary | ICD-10-CM

## 2022-11-02 DIAGNOSIS — J9621 Acute and chronic respiratory failure with hypoxia: Secondary | ICD-10-CM | POA: Diagnosis not present

## 2022-11-02 DIAGNOSIS — A419 Sepsis, unspecified organism: Secondary | ICD-10-CM | POA: Diagnosis not present

## 2022-11-03 ENCOUNTER — Encounter (HOSPITAL_COMMUNITY): Payer: Self-pay

## 2022-11-03 ENCOUNTER — Inpatient Hospital Stay (HOSPITAL_COMMUNITY)
Admission: EM | Admit: 2022-11-03 | Discharge: 2022-11-11 | DRG: 640 | Disposition: A | Discharge status: Hospice | Payer: Medicare Other | Source: Skilled Nursing Facility | Attending: Student | Admitting: Student

## 2022-11-03 ENCOUNTER — Non-Acute Institutional Stay: Payer: Medicare Other | Admitting: Family Medicine

## 2022-11-03 ENCOUNTER — Other Ambulatory Visit: Payer: Self-pay

## 2022-11-03 ENCOUNTER — Emergency Department (HOSPITAL_COMMUNITY): Payer: Medicare Other

## 2022-11-03 VITALS — BP 116/78 | HR 77 | Temp 97.2°F | Resp 18

## 2022-11-03 DIAGNOSIS — E861 Hypovolemia: Secondary | ICD-10-CM | POA: Diagnosis present

## 2022-11-03 DIAGNOSIS — R627 Adult failure to thrive: Secondary | ICD-10-CM | POA: Diagnosis present

## 2022-11-03 DIAGNOSIS — Z91012 Allergy to eggs: Secondary | ICD-10-CM

## 2022-11-03 DIAGNOSIS — K5900 Constipation, unspecified: Secondary | ICD-10-CM | POA: Diagnosis present

## 2022-11-03 DIAGNOSIS — E162 Hypoglycemia, unspecified: Secondary | ICD-10-CM | POA: Diagnosis present

## 2022-11-03 DIAGNOSIS — Z87891 Personal history of nicotine dependence: Secondary | ICD-10-CM | POA: Diagnosis not present

## 2022-11-03 DIAGNOSIS — R339 Retention of urine, unspecified: Secondary | ICD-10-CM

## 2022-11-03 DIAGNOSIS — G9341 Metabolic encephalopathy: Principal | ICD-10-CM | POA: Diagnosis present

## 2022-11-03 DIAGNOSIS — B401 Chronic pulmonary blastomycosis: Secondary | ICD-10-CM | POA: Diagnosis present

## 2022-11-03 DIAGNOSIS — G9340 Encephalopathy, unspecified: Secondary | ICD-10-CM | POA: Diagnosis present

## 2022-11-03 DIAGNOSIS — Z66 Do not resuscitate: Secondary | ICD-10-CM | POA: Diagnosis present

## 2022-11-03 DIAGNOSIS — Z8249 Family history of ischemic heart disease and other diseases of the circulatory system: Secondary | ICD-10-CM

## 2022-11-03 DIAGNOSIS — G928 Other toxic encephalopathy: Secondary | ICD-10-CM | POA: Diagnosis present

## 2022-11-03 DIAGNOSIS — Z6829 Body mass index (BMI) 29.0-29.9, adult: Secondary | ICD-10-CM | POA: Diagnosis not present

## 2022-11-03 DIAGNOSIS — Z823 Family history of stroke: Secondary | ICD-10-CM

## 2022-11-03 DIAGNOSIS — E43 Unspecified severe protein-calorie malnutrition: Principal | ICD-10-CM | POA: Diagnosis present

## 2022-11-03 DIAGNOSIS — E876 Hypokalemia: Secondary | ICD-10-CM | POA: Diagnosis present

## 2022-11-03 DIAGNOSIS — Z882 Allergy status to sulfonamides status: Secondary | ICD-10-CM

## 2022-11-03 DIAGNOSIS — A419 Sepsis, unspecified organism: Secondary | ICD-10-CM

## 2022-11-03 DIAGNOSIS — Z91040 Latex allergy status: Secondary | ICD-10-CM

## 2022-11-03 DIAGNOSIS — R4589 Other symptoms and signs involving emotional state: Secondary | ICD-10-CM | POA: Diagnosis not present

## 2022-11-03 DIAGNOSIS — J918 Pleural effusion in other conditions classified elsewhere: Secondary | ICD-10-CM | POA: Diagnosis present

## 2022-11-03 DIAGNOSIS — Z8619 Personal history of other infectious and parasitic diseases: Secondary | ICD-10-CM

## 2022-11-03 DIAGNOSIS — E871 Hypo-osmolality and hyponatremia: Secondary | ICD-10-CM

## 2022-11-03 DIAGNOSIS — J9611 Chronic respiratory failure with hypoxia: Secondary | ICD-10-CM | POA: Diagnosis present

## 2022-11-03 DIAGNOSIS — E538 Deficiency of other specified B group vitamins: Secondary | ICD-10-CM | POA: Diagnosis present

## 2022-11-03 DIAGNOSIS — I10 Essential (primary) hypertension: Secondary | ICD-10-CM

## 2022-11-03 DIAGNOSIS — R1312 Dysphagia, oropharyngeal phase: Secondary | ICD-10-CM | POA: Diagnosis present

## 2022-11-03 DIAGNOSIS — Z880 Allergy status to penicillin: Secondary | ICD-10-CM | POA: Diagnosis not present

## 2022-11-03 DIAGNOSIS — Z711 Person with feared health complaint in whom no diagnosis is made: Secondary | ICD-10-CM

## 2022-11-03 DIAGNOSIS — Z902 Acquired absence of lung [part of]: Secondary | ICD-10-CM | POA: Diagnosis not present

## 2022-11-03 DIAGNOSIS — I447 Left bundle-branch block, unspecified: Secondary | ICD-10-CM | POA: Diagnosis present

## 2022-11-03 DIAGNOSIS — E870 Hyperosmolality and hypernatremia: Secondary | ICD-10-CM | POA: Diagnosis present

## 2022-11-03 DIAGNOSIS — J309 Allergic rhinitis, unspecified: Secondary | ICD-10-CM

## 2022-11-03 DIAGNOSIS — R601 Generalized edema: Secondary | ICD-10-CM | POA: Diagnosis present

## 2022-11-03 DIAGNOSIS — J189 Pneumonia, unspecified organism: Secondary | ICD-10-CM | POA: Diagnosis present

## 2022-11-03 DIAGNOSIS — R1310 Dysphagia, unspecified: Secondary | ICD-10-CM | POA: Diagnosis not present

## 2022-11-03 DIAGNOSIS — L89159 Pressure ulcer of sacral region, unspecified stage: Secondary | ICD-10-CM

## 2022-11-03 DIAGNOSIS — Z888 Allergy status to other drugs, medicaments and biological substances status: Secondary | ICD-10-CM

## 2022-11-03 DIAGNOSIS — R000 Tachycardia, unspecified: Secondary | ICD-10-CM | POA: Diagnosis present

## 2022-11-03 DIAGNOSIS — Z7952 Long term (current) use of systemic steroids: Secondary | ICD-10-CM

## 2022-11-03 DIAGNOSIS — Z515 Encounter for palliative care: Secondary | ICD-10-CM | POA: Diagnosis not present

## 2022-11-03 DIAGNOSIS — E440 Moderate protein-calorie malnutrition: Secondary | ICD-10-CM

## 2022-11-03 DIAGNOSIS — B402 Pulmonary blastomycosis, unspecified: Secondary | ICD-10-CM | POA: Diagnosis present

## 2022-11-03 DIAGNOSIS — Z87440 Personal history of urinary (tract) infections: Secondary | ICD-10-CM

## 2022-11-03 DIAGNOSIS — Z9109 Other allergy status, other than to drugs and biological substances: Secondary | ICD-10-CM

## 2022-11-03 DIAGNOSIS — Z90710 Acquired absence of both cervix and uterus: Secondary | ICD-10-CM

## 2022-11-03 DIAGNOSIS — Z881 Allergy status to other antibiotic agents status: Secondary | ICD-10-CM | POA: Diagnosis not present

## 2022-11-03 DIAGNOSIS — Z7189 Other specified counseling: Secondary | ICD-10-CM | POA: Diagnosis not present

## 2022-11-03 DIAGNOSIS — J9621 Acute and chronic respiratory failure with hypoxia: Secondary | ICD-10-CM

## 2022-11-03 DIAGNOSIS — E860 Dehydration: Secondary | ICD-10-CM | POA: Diagnosis present

## 2022-11-03 DIAGNOSIS — Z8679 Personal history of other diseases of the circulatory system: Secondary | ICD-10-CM

## 2022-11-03 DIAGNOSIS — R4182 Altered mental status, unspecified: Secondary | ICD-10-CM | POA: Diagnosis present

## 2022-11-03 DIAGNOSIS — Z8041 Family history of malignant neoplasm of ovary: Secondary | ICD-10-CM

## 2022-11-03 DIAGNOSIS — Z79899 Other long term (current) drug therapy: Secondary | ICD-10-CM

## 2022-11-03 LAB — CBC WITH DIFFERENTIAL/PLATELET
Abs Immature Granulocytes: 0.06 10*3/uL (ref 0.00–0.07)
Basophils Absolute: 0 10*3/uL (ref 0.0–0.1)
Basophils Relative: 1 %
Eosinophils Absolute: 0 10*3/uL (ref 0.0–0.5)
Eosinophils Relative: 1 %
HCT: 34.5 % — ABNORMAL LOW (ref 36.0–46.0)
Hemoglobin: 11 g/dL — ABNORMAL LOW (ref 12.0–15.0)
Immature Granulocytes: 1 %
Lymphocytes Relative: 21 %
Lymphs Abs: 1.3 10*3/uL (ref 0.7–4.0)
MCH: 28 pg (ref 26.0–34.0)
MCHC: 31.9 g/dL (ref 30.0–36.0)
MCV: 87.8 fL (ref 80.0–100.0)
Monocytes Absolute: 0.8 10*3/uL (ref 0.1–1.0)
Monocytes Relative: 13 %
Neutro Abs: 4 10*3/uL (ref 1.7–7.7)
Neutrophils Relative %: 63 %
Platelets: 174 10*3/uL (ref 150–400)
RBC: 3.93 MIL/uL (ref 3.87–5.11)
RDW: 18.9 % — ABNORMAL HIGH (ref 11.5–15.5)
WBC: 6.3 10*3/uL (ref 4.0–10.5)
nRBC: 0 % (ref 0.0–0.2)

## 2022-11-03 LAB — COMPREHENSIVE METABOLIC PANEL
ALT: 25 U/L (ref 0–44)
AST: 44 U/L — ABNORMAL HIGH (ref 15–41)
Albumin: 1.7 g/dL — ABNORMAL LOW (ref 3.5–5.0)
Alkaline Phosphatase: 123 U/L (ref 38–126)
Anion gap: 4 — ABNORMAL LOW (ref 5–15)
BUN: 10 mg/dL (ref 8–23)
CO2: 31 mmol/L (ref 22–32)
Calcium: 8.8 mg/dL — ABNORMAL LOW (ref 8.9–10.3)
Chloride: 115 mmol/L — ABNORMAL HIGH (ref 98–111)
Creatinine, Ser: 0.69 mg/dL (ref 0.44–1.00)
GFR, Estimated: >60 mL/min (ref >60)
Glucose, Bld: 95 mg/dL (ref 70–99)
Potassium: 2.6 mmol/L — CL (ref 3.5–5.1)
Sodium: 150 mmol/L — ABNORMAL HIGH (ref 135–145)
Total Bilirubin: 0.7 mg/dL (ref 0.3–1.2)
Total Protein: 6.3 g/dL — ABNORMAL LOW (ref 6.5–8.1)

## 2022-11-03 LAB — BLOOD GAS, VENOUS
Acid-Base Excess: 10.1 mmol/L — ABNORMAL HIGH (ref 0.0–2.0)
Bicarbonate: 35.6 mmol/L — ABNORMAL HIGH (ref 20.0–28.0)
O2 Saturation: 88.8 %
Patient temperature: 37
pCO2, Ven: 50 mmHg (ref 44–60)
pH, Ven: 7.46 — ABNORMAL HIGH (ref 7.25–7.43)
pO2, Ven: 53 mmHg — ABNORMAL HIGH (ref 32–45)

## 2022-11-03 LAB — TSH: TSH: 1.532 u[IU]/mL (ref 0.350–4.500)

## 2022-11-03 LAB — LACTIC ACID, PLASMA: Lactic Acid, Venous: 1 mmol/L (ref 0.5–1.9)

## 2022-11-03 LAB — ETHANOL: Alcohol, Ethyl (B): <10 mg/dL (ref <10)

## 2022-11-03 LAB — MAGNESIUM: Magnesium: 2.4 mg/dL (ref 1.7–2.4)

## 2022-11-03 LAB — AMMONIA: Ammonia: 14 umol/L (ref 9–35)

## 2022-11-03 MED ORDER — POTASSIUM CHLORIDE 10 MEQ/100ML IV SOLN
10.0000 meq | INTRAVENOUS | Status: AC
  Administered 2022-11-03 – 2022-11-04 (×3): 10 meq via INTRAVENOUS
  Filled 2022-11-03 (×3): qty 100

## 2022-11-03 MED ORDER — SODIUM CHLORIDE 0.9 % IV BOLUS
1000.0000 mL | Freq: Once | INTRAVENOUS | Status: AC
  Administered 2022-11-03: 1000 mL via INTRAVENOUS

## 2022-11-03 MED ORDER — SUCRALFATE 1 GM/10ML PO SUSP
1.0000 g | Freq: Three times a day (TID) | ORAL | Status: DC
  Administered 2022-11-06 – 2022-11-08 (×3): 1 g via ORAL
  Filled 2022-11-03 (×9): qty 10

## 2022-11-03 MED ORDER — KCL IN DEXTROSE-NACL 20-5-0.45 MEQ/L-%-% IV SOLN
INTRAVENOUS | Status: DC
  Filled 2022-11-03 (×2): qty 1000

## 2022-11-03 MED ORDER — METOPROLOL SUCCINATE ER 25 MG PO TB24
12.5000 mg | ORAL_TABLET | Freq: Every evening | ORAL | Status: DC
  Administered 2022-11-05 – 2022-11-06 (×2): 12.5 mg via ORAL
  Filled 2022-11-03 (×2): qty 1

## 2022-11-03 MED ORDER — ACETAMINOPHEN 325 MG PO TABS
650.0000 mg | ORAL_TABLET | Freq: Four times a day (QID) | ORAL | Status: DC | PRN
  Administered 2022-11-06 (×2): 650 mg via ORAL
  Filled 2022-11-03 (×2): qty 2

## 2022-11-03 MED ORDER — KETOTIFEN FUMARATE 0.035 % OP SOLN
1.0000 [drp] | Freq: Two times a day (BID) | OPHTHALMIC | Status: DC
  Administered 2022-11-05 – 2022-11-08 (×4): 1 [drp] via OPHTHALMIC
  Filled 2022-11-03: qty 5

## 2022-11-03 MED ORDER — PANTOPRAZOLE SODIUM 40 MG IV SOLR
40.0000 mg | INTRAVENOUS | Status: DC
  Administered 2022-11-04 – 2022-11-10 (×7): 40 mg via INTRAVENOUS
  Filled 2022-11-03 (×7): qty 10

## 2022-11-03 MED ORDER — CEFDINIR 250 MG/5ML PO SUSR
300.0000 mg | Freq: Two times a day (BID) | ORAL | Status: DC
  Filled 2022-11-03: qty 6

## 2022-11-03 MED ORDER — PANTOPRAZOLE SODIUM 40 MG PO PACK: 40.0000 mg | PACK | Freq: Every day | ORAL | Status: DC

## 2022-11-03 MED ORDER — SODIUM CHLORIDE 0.9 % IV SOLN
1.0000 g | Freq: Once | INTRAVENOUS | Status: AC
  Administered 2022-11-04: 1 g via INTRAVENOUS
  Filled 2022-11-03: qty 10

## 2022-11-03 MED ORDER — ACETAMINOPHEN 650 MG RE SUPP
650.0000 mg | Freq: Four times a day (QID) | RECTAL | Status: DC | PRN
  Administered 2022-11-04 – 2022-11-11 (×13): 650 mg via RECTAL
  Filled 2022-11-03 (×20): qty 1

## 2022-11-03 MED ORDER — CEFDINIR 250 MG/5ML PO SUSR
300.0000 mg | Freq: Two times a day (BID) | ORAL | Status: DC
  Filled 2022-11-03 (×2): qty 6

## 2022-11-03 MED ORDER — ENOXAPARIN SODIUM 40 MG/0.4ML IJ SOSY
40.0000 mg | PREFILLED_SYRINGE | INTRAMUSCULAR | Status: DC
  Administered 2022-11-04 – 2022-11-10 (×7): 40 mg via SUBCUTANEOUS
  Filled 2022-11-03 (×7): qty 0.4

## 2022-11-03 NOTE — H&P (Signed)
History and Physical    Barbara Nelson ZOX:[id number] DOB: [birth year] DOA: 11/03/2022  PCP: [name], MD   Patient coming from: SNF  Chief Complaint: Not eating/drinking, decreased LOC   HPI: Barbara Nelson is a 82 y.o. female with medical history significant for chronic pulmonary blastomycosis status post lobectomy in 2014, chronic hypoxic respiratory failure, recurrent admissions for UTI and pneumonia, dysphagia with recent aspiration pneumonia, and hypertension who presents from her SNF with decreased level of consciousness.  Per EMS report, the patient seemed to have worsening in her dysphagia and oral nutrition was being withheld.  There was concern for dehydration and she was given a liter of fluids subcutaneously yesterday at her facility.  Today, she was more lethargic, prompting EMS to be called.  Her daughters also note that she required in and out catheterization twice today.    ED Course: Upon arrival to the ED, patient is found to be afebrile and saturating 100% on 4 L/min of supplemental oxygen with tachypnea, tachycardia, and elevated blood pressure.  EKG demonstrates sinus rhythm with LBBB and LPFB.  Chest x-ray demonstrates improving airspace disease.  No acute findings noted on head CT.  Labs are most notable for sodium 150, potassium 2.6, albumin 1.7, normal renal function, and normal WBC.  Patient was given a liter of normal saline and 40 mill equivalents IV potassium in the ED.  Review of Systems:  ROS limited by patient's clinical condition.  Past Medical History:  Diagnosis Date   Benign essential HTN 10/31/2015   Cardiac tamponade due to viral pericarditis    LBBB (left bundle branch block)    Lung mass    fungal infection s/p resection at [facility]   Postoperative atrial fibrillation [facility])    Pulmonary nodule     Past Surgical History:  Procedure Laterality Date   ABDOMINAL HYSTERECTOMY  1988   chest tube placement  09/03/11   LUNG REMOVAL, PARTIAL      right knee arthoscopy with meniscal repair     subxiphoid pericardial window and drainage anterior partial pericardiectomy  09/03/11    Social History:   reports that she quit smoking about 43 years ago. Her smoking use included cigarettes. She has a 15.00 pack-year smoking history. She has never used smokeless tobacco. She reports that she does not drink alcohol and does not use drugs.  Allergies  Allergen Reactions   Latex Rash   Penicillins Rash and Itching   Tape Hives and Rash   Z-Pak [Azithromycin] Rash and Other (See Comments)    Mouth sores   Dairycare [Lactase-Lactobacillus] Other (See Comments)    intolerance   Egg-Derived Products Nausea And Vomiting   Potassium Nausea And Vomiting   Amoxil [Amoxicillin] Rash   Sulfa Antibiotics Rash    Family History  Problem Relation Age of Onset   Hypertension Father    Stroke Father    Hypertension Mother    Ovarian cancer Mother    Prostate cancer Brother    Thyroid disease Sister    Hypertension Sister    Leukemia Maternal Aunt      Prior to Admission medications   Medication Sig Start Date End Date Taking? Authorizing Provider  metoprolol succinate (TOPROL-XL) 25 MG 24 hr tablet Take 0.5 tablets (12.5 mg total) by mouth daily. Patient taking differently: Take 12.5 mg by mouth every evening. 06/26/22  Yes [name], [name], MD  acetaminophen (TYLENOL) 325 MG tablet Take 650 mg by mouth every 6 (six) hours as needed for  mild pain or moderate pain.    [provider]  azelastine (OPTIVAR) 0.05 % ophthalmic solution Apply 1 drop to eye 2 (two) times daily. 09/16/22   [provider]  cefdinir (OMNICEF) 250 MG/5ML suspension Take 6 mLs (300 mg total) by mouth 2 (two) times daily for 28 days. 10/30/22 11/27/22  Dahal, Melina Schools, MD  food thickener (SIMPLYTHICK, NECTAR/LEVEL 2/MILDLY THICK,) GEL Take 5 packets by mouth as needed. 10/30/22   Dahal, Melina Schools, MD  guaiFENesin-dextromethorphan (ROBITUSSIN DM) 100-10 MG/5ML  syrup Take 5 mLs by mouth every 4 (four) hours as needed for cough. 10/30/22   Lorin Glass, MD  hydrocortisone (ANUSOL-HC) 25 MG suppository Place 1 suppository (25 mg total) rectally 2 (two) times daily. 10/30/22   Lorin Glass, MD  magic mouthwash SOLN Take 5 mLs by mouth 4 (four) times daily. 10/30/22   Lorin Glass, MD  Multiple Vitamin (MULTIVITAMIN WITH MINERALS) TABS tablet Take 1 tablet by mouth daily.    [provider]  Nutritional Supplements (,FEEDING SUPPLEMENT, PROSOURCE PLUS) liquid Take 30 mLs by mouth 3 (three) times daily between meals. 10/30/22   Lorin Glass, MD  Nutritional Supplements (FEEDING SUPPLEMENT, KATE FARMS STANDARD 1.4,) LIQD liquid Take 325 mLs by mouth 2 (two) times daily between meals. 10/30/22   Dahal, Melina Schools, MD  olopatadine (PATADAY) 0.1 % ophthalmic solution Place 1 drop into both eyes daily as needed for allergies.    [provider]  pantoprazole sodium (PROTONIX) 40 mg Place 40 mg into feeding tube daily. 10/30/22   Dahal, Melina Schools, MD  polyethylene glycol powder (MIRALAX) 17 GM/SCOOP powder Take 17 g by mouth 2 (two) times daily as needed. Decrease to once daily if having watery stools. 10/30/22   Dahal, Melina Schools, MD  potassium chloride (KLOR-CON) 20 MEQ packet Take 20 mEq by mouth 2 (two) times daily. 10/30/22   Dahal, Melina Schools, MD  sucralfate (CARAFATE) 1 GM/10ML suspension Take 10 mLs (1 g total) by mouth 4 (four) times daily -  with meals and at bedtime. 10/30/22   Dahal, Melina Schools, MD  triamcinolone (KENALOG) 0.025 % ointment Apply 1 Application topically 2 (two) times daily. Upper back, arms, legs 08/13/22   [provider]  witch hazel-glycerin (TUCKS) pad Apply topically as needed for itching. 10/30/22   Lorin Glass, MD    Physical Exam: Vitals:   11/03/22 1902 11/03/22 1930 11/03/22 2033 11/03/22 2145  BP:  (!) 140/96 (!) 139/91 (!) 155/90  Pulse:  72 82 73  Resp:  (!) 28 17 14   Temp:      TempSrc:      SpO2: 100% 99% 99% 100%   Weight: 86.2 kg     Height: 5\' 6"  (1.676 m)       Constitutional: NAD, no pallor or cyanosis   Eyes: PERTLA, lids and conjunctivae normal ENMT: Mucous membranes are dry. Posterior pharynx clear of any exudate or lesions.   Neck: supple, no masses  Respiratory: no wheezing. No accessory muscle use.  Cardiovascular: S1 & S2 heard, regular rate and rhythm. No extremity edema.   Abdomen: No distension, no tenderness, soft. Bowel sounds active.  Musculoskeletal: no clubbing / cyanosis. No joint deformity upper and lower extremities.   Skin: no significant rashes, lesions, ulcers. Warm, dry, well-perfused. Neurologic: CN 2-12 grossly intact. Moving all extremities. Somnolent. Wakes to voice and makes brief eye-contact but not answering questions.     Labs and Imaging on Admission: I have personally reviewed following labs and imaging studies  CBC: Recent Labs  Lab 10/28/22 1253 11/03/22 2020  WBC 6.8 6.3  NEUTROABS  --  4.0  HGB 10.7* 11.0*  HCT 33.3* 34.5*  MCV 88.6 87.8  PLT 172 174   Basic Metabolic Panel: Recent Labs  Lab 10/28/22 1253 11/03/22 2020  NA 141 150*  K 3.3* 2.6*  CL 104 115*  CO2 30 31  GLUCOSE 77 95  BUN 12 10  CREATININE 0.67 0.69  CALCIUM 8.4* 8.8*  MG  --  2.4   GFR: Estimated Creatinine Clearance: 60 mL/min (by C-G formula based on SCr of 0.69 mg/dL). Liver Function Tests: Recent Labs  Lab 11/03/22 2020  AST 44*  ALT 25  ALKPHOS 123  BILITOT 0.7  PROT 6.3*  ALBUMIN 1.7*   No results for input(s): "LIPASE", "AMYLASE" in the last 168 hours. Recent Labs  Lab 11/03/22 2020  AMMONIA 14   Coagulation Profile: No results for input(s): "INR", "PROTIME" in the last 168 hours. Cardiac Enzymes: No results for input(s): "CKTOTAL", "CKMB", "CKMBINDEX", "TROPONINI" in the last 168 hours. BNP (last 3 results) No results for input(s): "PROBNP" in the last 8760 hours. HbA1C: No results for input(s): "HGBA1C" in the last 72 hours. CBG: No  results for input(s): "GLUCAP" in the last 168 hours. Lipid Profile: No results for input(s): "CHOL", "HDL", "LDLCALC", "TRIG", "CHOLHDL", "LDLDIRECT" in the last 72 hours. Thyroid Function Tests: Recent Labs    11/03/22 2020  TSH 1.532   Anemia Panel: No results for input(s): "VITAMINB12", "FOLATE", "FERRITIN", "TIBC", "IRON", "RETICCTPCT" in the last 72 hours. Urine analysis:    Component Value Date/Time   COLORURINE STRAW (A) 10/23/2022 1045   APPEARANCEUR CLEAR 10/23/2022 1045   APPEARANCEUR Hazy (A) 08/10/2022 1300   LABSPEC 1.006 10/23/2022 1045   PHURINE 6.0 10/23/2022 1045   GLUCOSEU NEGATIVE 10/23/2022 1045   HGBUR NEGATIVE 10/23/2022 1045   BILIRUBINUR NEGATIVE 10/23/2022 1045   BILIRUBINUR Negative 08/10/2022 1300   KETONESUR 5 (A) 10/23/2022 1045   PROTEINUR NEGATIVE 10/23/2022 1045   UROBILINOGEN 0.2 09/15/2011 0530   NITRITE NEGATIVE 10/23/2022 1045   LEUKOCYTESUR NEGATIVE 10/23/2022 1045   Sepsis Labs: @LABRCNTIP (procalcitonin:4,lacticidven:4) )No results found for this or any previous visit (from the past 240 hour(s)).   Radiological Exams on Admission: DG Chest Port 1 View  Result Date: 11/03/2022 CLINICAL DATA:  AMS EXAM: PORTABLE CHEST - 1 VIEW COMPARISON:  10/20/2022 FINDINGS: For significant improvement in right upper lung airspace disease. Slight decrease in left retrocardiac consolidation/atelectasis. Small residual pleural effusions. Stable mild cardiomegaly. No pneumothorax. Visualized bones unremarkable. Residual contrast in the splenic flexure of the colon. IMPRESSION: Improving right upper lung and left retrocardiac airspace disease. Electronically Signed   By: Corlis Leak M.D.   On: 11/03/2022 20:12   CT HEAD WO CONTRAST  Result Date: 11/03/2022 CLINICAL DATA:  Mental status change EXAM: CT HEAD WITHOUT CONTRAST TECHNIQUE: Contiguous axial images were obtained from the base of the skull through the vertex without intravenous contrast. RADIATION DOSE  REDUCTION: This exam was performed according to the departmental dose-optimization program which includes automated exposure control, adjustment of the mA and/or kV according to patient size and/or use of iterative reconstruction technique. COMPARISON:  Head CT 10/20/2022 FINDINGS: Brain: No evidence of acute infarction, hemorrhage, hydrocephalus, extra-axial collection or mass lesion/mass effect. There is mild diffuse atrophy and mild periventricular white matter hypodensity, unchanged from prior. Vascular: No hyperdense vessel or unexpected calcification. Skull: Normal. Negative for fracture or focal lesion. Sinuses/Orbits: No acute finding. Other: None. IMPRESSION: 1. No acute  intracranial abnormality. 2. Mild diffuse atrophy and mild periventricular white matter hypodensity, unchanged from prior. Electronically Signed   By: Darliss Cheney M.D.   On: 11/03/2022 20:01    EKG: Independently reviewed. Sinus rhythm, LBBB, LPFB.   Assessment/Plan   1. Acute encephalopathy  - Pt more lethargic and confused over the past 48 hours  - No acute findings on head CT, no evidence for infection (UA not yet resulted)  - Likely d/t dehydration and metabolic derangements  - Continue IVF hydration and correction of electrolyte abnormalities, use delirium precautions, supportive care    2. Dysphagia  - There was concern for worsening dysphagia at the SNF and oral nutrition was being held per EMS report  - Keep NPO for now and consult SLP    3. Hypernatremia  - Serum sodium 150 in setting of hypovolemia  - She was given a liter of NS in ED  - Continue IVF hydration with 1/2 NS and repeat chem panel in am    4. Hypokalemia  - Replacing, will repeat chem panel in am    5. Chronic hypoxic respiratory failure; recent pneumonia with parapneumonic effusion   - Recently discharged on cefdinir for  28 days (through 11/27/22)  - Appears improved on CXR in ED  - Continue supplemental O2, continue cefdinir   6.  Hypertension  - Continue metoprolol   7. Urinary retention  - In and out cath as needed for now   DVT prophylaxis: Lovenox  Code Status: DNR  Level of Care: Level of care: Med-Surg Family Communication: One sister at bedside, one by phone   Disposition Plan:  Patient is from: SNF  Anticipated d/c is to: SNF  Anticipated d/c date is: 11/05/22 Patient currently: Pending rehydration, correction of electrolyte derangements, improved mental status  Consults called: None  Admission status: Inpatient     Briscoe Deutscher, MD [facility]  11/03/2022, 10:07 PM

## 2022-11-03 NOTE — ED Triage Notes (Signed)
"  Released here 2 days ago to [facility] home rehab after being inpatient here for 2 weeks for UTI and PNA. Rehab center says she decreased output and they are worried about dehydration and urine retention. They gave her 1 liter of fluid SQ yesterday. Today her mental status is decreased with more lethargy" per EMS  Patient is on a liquid diet due to aspiration precautions.

## 2022-11-03 NOTE — ED Provider Notes (Signed)
Barbara Nelson EMERGENCY DEPARTMENT AT [facility] Provider Note   CSN: [id number] Arrival date & time: 11/03/22  1843     History  Chief Complaint  Patient presents with   Dehydration    Barbara Nelson is a 82 y.o. female.  82 yo F with a chief complaints of altered mental status.  Reportedly had something similar happen a few days ago and came and was seen.  I do not see any visits over the past week.  I personally saw the patient about a month ago for aspiration pneumonia.  At that time she was accompanied by the daughter who provided most of the history.  Most of the history again today is by EMS.  Tell me that she has been staring into space more often.  Less active.  Was reportedly closer to baseline yesterday.  Denied cough or congestion.  Denies abdominal pain nausea or vomiting.        Home Medications Prior to Admission medications   Medication Sig Start Date End Date Taking? Authorizing Provider  acetaminophen (TYLENOL) 325 MG tablet Take 650 mg by mouth every 6 (six) hours as needed for mild pain or moderate pain.    [provider]  azelastine (OPTIVAR) 0.05 % ophthalmic solution Apply 1 drop to eye 2 (two) times daily. 09/16/22   [provider]  cefdinir (OMNICEF) 250 MG/5ML suspension Take 6 mLs (300 mg total) by mouth 2 (two) times daily for 28 days. 10/30/22 11/27/22  Dahal, Melina Schools, MD  food thickener (SIMPLYTHICK, NECTAR/LEVEL 2/MILDLY THICK,) GEL Take 5 packets by mouth as needed. 10/30/22   Dahal, Melina Schools, MD  guaiFENesin-dextromethorphan (ROBITUSSIN DM) 100-10 MG/5ML syrup Take 5 mLs by mouth every 4 (four) hours as needed for cough. 10/30/22   Lorin Glass, MD  hydrocortisone (ANUSOL-HC) 25 MG suppository Place 1 suppository (25 mg total) rectally 2 (two) times daily. 10/30/22   Lorin Glass, MD  magic mouthwash SOLN Take 5 mLs by mouth 4 (four) times daily. 10/30/22   Lorin Glass, MD  metoprolol succinate (TOPROL-XL) 25 MG 24 hr tablet  Take 0.5 tablets (12.5 mg total) by mouth daily. Patient taking differently: Take 12.5 mg by mouth at bedtime. 06/26/22   [name], [name], MD  Multiple Vitamin (MULTIVITAMIN WITH MINERALS) TABS tablet Take 1 tablet by mouth daily.    [provider]  Nutritional Supplements (,FEEDING SUPPLEMENT, PROSOURCE PLUS) liquid Take 30 mLs by mouth 3 (three) times daily between meals. 10/30/22   Lorin Glass, MD  Nutritional Supplements (FEEDING SUPPLEMENT, KATE FARMS STANDARD 1.4,) LIQD liquid Take 325 mLs by mouth 2 (two) times daily between meals. 10/30/22   Dahal, Melina Schools, MD  olopatadine (PATADAY) 0.1 % ophthalmic solution Place 1 drop into both eyes daily as needed for allergies.    [provider]  pantoprazole sodium (PROTONIX) 40 mg Place 40 mg into feeding tube daily. 10/30/22   Dahal, Melina Schools, MD  polyethylene glycol powder (MIRALAX) 17 GM/SCOOP powder Take 17 g by mouth 2 (two) times daily as needed. Decrease to once daily if having watery stools. 10/30/22   Dahal, Melina Schools, MD  potassium chloride (KLOR-CON) 20 MEQ packet Take 20 mEq by mouth 2 (two) times daily. 10/30/22   Dahal, Melina Schools, MD  sucralfate (CARAFATE) 1 GM/10ML suspension Take 10 mLs (1 g total) by mouth 4 (four) times daily -  with meals and at bedtime. 10/30/22   Dahal, Melina Schools, MD  triamcinolone (KENALOG) 0.025 % ointment Apply 1 Application topically 2 (two) times  daily. Upper back, arms, legs 08/13/22   [provider]  witch hazel-glycerin (TUCKS) pad Apply topically as needed for itching. 10/30/22   Lorin Glass, MD      Allergies    Latex, Penicillins, Tape, Z-pak [azithromycin], Dairycare [lactase-lactobacillus], Egg-derived products, Potassium, Amoxil [amoxicillin], and Sulfa antibiotics    Review of Systems   Review of Systems  Physical Exam Updated Vital Signs BP (!) 155/90   Pulse 73   Temp (!) 97.3 F (36.3 C) (Rectal)   Resp 14   Ht 5\' 6"  (1.676 m)   Wt 86.2 kg   LMP  (LMP Unknown)   SpO2  100%   BMI 30.67 kg/m  Physical Exam Vitals and nursing note reviewed.  Constitutional:      General: She is not in acute distress.    Appearance: She is well-developed. She is not diaphoretic.  HENT:     Head: Normocephalic and atraumatic.  Eyes:     Pupils: Pupils are equal, round, and reactive to light.  Cardiovascular:     Rate and Rhythm: Normal rate and regular rhythm.     Heart sounds: No murmur heard.    No friction rub. No gallop.  Pulmonary:     Effort: Pulmonary effort is normal.     Breath sounds: No wheezing or rales.  Abdominal:     General: There is no distension.     Palpations: Abdomen is soft.     Tenderness: There is no abdominal tenderness.  Musculoskeletal:        General: No tenderness.     Cervical back: Normal range of motion and neck supple.  Skin:    General: Skin is warm and dry.  Neurological:     Mental Status: She is alert and oriented to person, place, and time.     Comments: Patient seems very pleasantly confused.  She denies anything bothering her.  Denies pain anywhere.  Moves all 4 extremities spontaneously.  No obvious focal weakness.  Psychiatric:        Behavior: Behavior normal.     ED Results / Procedures / Treatments   Labs (all labs ordered are listed, but only abnormal results are displayed) Labs Reviewed  COMPREHENSIVE METABOLIC PANEL - Abnormal; Notable for the following components:      Result Value   Sodium 150 (*)    Potassium 2.6 (*)    Chloride 115 (*)    Calcium 8.8 (*)    Total Protein 6.3 (*)    Albumin 1.7 (*)    AST 44 (*)    Anion gap 4 (*)    All other components within normal limits  CBC WITH DIFFERENTIAL/PLATELET - Abnormal; Notable for the following components:   Hemoglobin 11.0 (*)    HCT 34.5 (*)    RDW 18.9 (*)    All other components within normal limits  BLOOD GAS, VENOUS - Abnormal; Notable for the following components:   pH, Ven 7.46 (*)    pO2, Ven 53 (*)    Bicarbonate 35.6 (*)     Acid-Base Excess 10.1 (*)    All other components within normal limits  AMMONIA  LACTIC ACID, PLASMA  ETHANOL  TSH  MAGNESIUM  URINALYSIS, ROUTINE W REFLEX MICROSCOPIC    EKG EKG Interpretation  Date/Time:  Tuesday November 03 2022 19:31:41 EDT Ventricular Rate:  80 PR Interval:  157 QRS Duration: 131 QT Interval:  377 QTC Calculation: 435 R Axis:   202 Text Interpretation: Sinus rhythm RBBB  and LPFB Inferior infarct, old Anterior infarct, old Lateral leads are also involved No significant change since last tracing Confirmed by Melene Plan [id number]) on 11/03/2022 7:33:18 PM  Radiology DG Chest Port 1 View  Result Date: 11/03/2022 CLINICAL DATA:  AMS EXAM: PORTABLE CHEST - 1 VIEW COMPARISON:  10/20/2022 FINDINGS: For significant improvement in right upper lung airspace disease. Slight decrease in left retrocardiac consolidation/atelectasis. Small residual pleural effusions. Stable mild cardiomegaly. No pneumothorax. Visualized bones unremarkable. Residual contrast in the splenic flexure of the colon. IMPRESSION: Improving right upper lung and left retrocardiac airspace disease. Electronically Signed   By: Corlis Leak M.D.   On: 11/03/2022 20:12   CT HEAD WO CONTRAST  Result Date: 11/03/2022 CLINICAL DATA:  Mental status change EXAM: CT HEAD WITHOUT CONTRAST TECHNIQUE: Contiguous axial images were obtained from the base of the skull through the vertex without intravenous contrast. RADIATION DOSE REDUCTION: This exam was performed according to the departmental dose-optimization program which includes automated exposure control, adjustment of the mA and/or kV according to patient size and/or use of iterative reconstruction technique. COMPARISON:  Head CT 10/20/2022 FINDINGS: Brain: No evidence of acute infarction, hemorrhage, hydrocephalus, extra-axial collection or mass lesion/mass effect. There is mild diffuse atrophy and mild periventricular white matter hypodensity, unchanged from prior.  Vascular: No hyperdense vessel or unexpected calcification. Skull: Normal. Negative for fracture or focal lesion. Sinuses/Orbits: No acute finding. Other: None. IMPRESSION: 1. No acute intracranial abnormality. 2. Mild diffuse atrophy and mild periventricular white matter hypodensity, unchanged from prior. Electronically Signed   By: Darliss Cheney M.D.   On: 11/03/2022 20:01    Procedures .Critical Care  Performed by: Melene Plan, DO Authorized by: Melene Plan, DO   Critical care provider statement:    Critical care time (minutes):  35   Critical care time was exclusive of:  Separately billable procedures and treating other patients   Critical care was time spent personally by me on the following activities:  Development of treatment plan with patient or surrogate, discussions with consultants, evaluation of patient's response to treatment, examination of patient, ordering and review of laboratory studies, ordering and review of radiographic studies, ordering and performing treatments and interventions, pulse oximetry, re-evaluation of patient's condition and review of old charts   Care discussed with: admitting provider       Medications Ordered in ED Medications  potassium chloride 10 mEq in 100 mL IVPB (10 mEq Intravenous New Bag/Given 11/03/22 2146)  sodium chloride 0.9 % bolus 1,000 mL (0 mLs Intravenous Stopped 11/03/22 2136)    ED Course/ Medical Decision Making/ A&P                             Medical Decision Making Amount and/or Complexity of Data Reviewed Labs: ordered. Radiology: ordered.  Risk Prescription drug management.   82 yo F with a chief complaint of altered mental status.  On my record review the patient was admitted in May for aspiration pneumonia.  Found to have a pronounced difficulty swallowing.  She had had progressive decline over months and so palliative care was consulted.  She was then transition to a nursing facility.  Sounds like the patient's mentation  has fluctuated over the past couple days.  Will obtain a laboratory evaluation and CT of the head chest x-ray.  Reassess.  The patients results and plan were reviewed and discussed.   Any x-rays performed were independently reviewed by myself.   Differential  diagnosis were considered with the presenting HPI.  Medications  potassium chloride 10 mEq in 100 mL IVPB (10 mEq Intravenous New Bag/Given 11/03/22 2146)  sodium chloride 0.9 % bolus 1,000 mL (0 mLs Intravenous Stopped 11/03/22 2136)    Vitals:   11/03/22 1902 11/03/22 1930 11/03/22 2033 11/03/22 2145  BP:  (!) 140/96 (!) 139/91 (!) 155/90  Pulse:  72 82 73  Resp:  (!) 28 17 14   Temp:      TempSrc:      SpO2: 100% 99% 99% 100%  Weight: 86.2 kg     Height: 5\' 6"  (1.676 m)       Final diagnoses:  Dehydration  Hypokalemia    Admission/ observation were discussed with the admitting physician, patient and/or family and they are comfortable with the plan.          Final Clinical Impression(s) / ED Diagnoses Final diagnoses:  Dehydration  Hypokalemia    Rx / DC Orders ED Discharge Orders     None         Melene Plan, DO 11/03/22 2156

## 2022-11-03 NOTE — Progress Notes (Signed)
Therapist, nutritional Palliative Care Consult Note Telephone: [phone]  Fax: [phone]   Date of encounter: 11/03/22 10:16 AM PATIENT NAME: Barbara Nelson [address] [place] [address]   [phone] (home)  DOB: 03-14-1941 MRN: [id number] PRIMARY CARE PROVIDER:    [name], MD,  [address] [place] [address] [phone]  REFERRING PROVIDER:   [name], MD [address] [place],  [place] [address] [phone]  Emergency Contact:    Contact Information     Name Relation Home Work [place] Daughter   [phone]   Cueva,senta Daughter   [phone]       Health Care Agent: Barbara Nelson   I met face to face with patient and daughter Barbara Nelson in [facility]. [name] daughter Barbara Nelson was present on phone during visit. Palliative Care was asked to follow this patient by consultation request of Barbara Dimitri, MD to address advance care planning and complex medical decision making. This is an initial visit.  ADVANCE CARE PLANNING/GOALS OF CARE: Health care surrogate gave their permission to discuss.   PRESENT FOR DISCUSSION: Patient, daughter Barbara Nelson in person, daughter Barbara Nelson by phone and provider Barbara Man FNP-C  GOALS: Keep patient comfortable and improve function if possible  BARRIERS: Currently on skilled days for rehab   Our advance care planning conversation included a discussion about:    The value and importance of advance care planning  Review of an advance directive document-MOST.  CODE STATUS: DNR   ASSESSMENT AND / RECOMMENDATIONS:  PPS: 40%  Oropharyngeal dysphagia Advised alert for all intake, upright for intake and 45-60 minutes after. Encouraged exam for pocketing. Chin tuck to aid swallow. Continue follow up with ST and recommended dysphagia diet with thickened liquids.  2.   Moderate malnutrition Albumin low on 10/21/22 at 1.5. Encourage  smoothies with protein and house supplement TID.  3.   Pressure injury of sacral skin Encourage position shift Q 2 hours while awake, use of barrier protective cream. Continue to float heels.  4.   Palliative Care Encounter Discussed DNR status and purpose of MOST   Follow up Palliative Care Visit:  Palliative Care continuing to follow up by monitoring for changes in appetite, weight, functional and cognitive status for chronic disease progression and management in agreement with patient's stated goals of care. Next visit in 2 weeks or prn.  This visit was coded based on medical decision making (MDM).  Chief Complaint  [facility] received a referral to follow up with patient for chronic disease management in setting of recent hospitalization for aspiration pneumonia.  Palliative is also following to assist with advance directive and defining/refining goals of care.  HISTORY OF PRESENT ILLNESS: Barbara Nelson is a 82 y.o. year old female with chronic systolic heart failure who had recent hospitalization for aspiration pneumonia was treated and developed thrush.  She was d/c'd from the hospital to do rehab at [facility].  On presentation today pt does not contribute any significant history and has difficulty maintaining alertness.  Daughter states that pt was awake most of the evening last night and has been sleeping today. Information obtained from daughters and paid caregiver present in room.  Pt has had some minor skin irritation over sacral area but they have been putting cream on to prevent further breakdown.  Pt was moved to right side for visual inspection of sacral skin.  Daughter indicates she does not tolerate being fully on  right side due to shoulder discomfort. 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   ACTIVITIES OF DAILY LIVING: CONTINENT OF BLADDER?  No BOWEL? Yes BATHING/DRESSING/FEEDING: Requires assistance  MOBILITY:    CHAIR/BEDBOUND  APPETITE? Fair, diet changed to pureed  WEIGHT: 200 lbs on 10/01/22  CURRENT PROBLEM LIST:  Patient Active Problem List   Diagnosis Date Noted   Altered mental state 10/30/2022   Need for emotional support 10/23/2022   Counseling and coordination of care 10/23/2022   Poor appetite 10/23/2022   Palliative care encounter 10/21/2022   Weakness of both lower extremities 10/21/2022   DNR (do not resuscitate) 10/21/2022   Goals of care, counseling/discussion 10/21/2022   Malnutrition of moderate degree 10/19/2022   Oral candidiasis 10/12/2022   FTT (failure to thrive) in adult 10/12/2022   Generalized weakness 10/12/2022   Aspiration pneumonia (HCC) 10/11/2022   Urinary retention 07/27/2022   Nausea and vomiting 07/10/2022   Pressure injury of skin 07/10/2022   Unspecified atrial fibrillation (HCC) 07/01/2022   Acute metabolic encephalopathy 06/30/2022   Hypercalcemia 06/30/2022   Dehydration 06/30/2022   Acute prerenal azotemia 06/30/2022   Transaminitis 06/30/2022   Constipation 06/30/2022   DM2 (diabetes mellitus, type 2) (HCC) 06/30/2022   Allergic rhinitis 06/30/2022   Chronic diastolic CHF (congestive heart failure) (HCC) 06/30/2022   Dyspnea 06/20/2022   Acute hypoxic respiratory failure (HCC) 06/18/2022   Hyponatremia 06/18/2022   Body mass index (BMI) 39.0-39.9, adult 01/05/2022   Decreased estrogen level 01/05/2022   Prediabetes 01/05/2022   Dry eyes 01/05/2022   History of pericarditis 01/05/2022   Essential hypertension 01/05/2022   LBBB (left bundle branch block) 09/17/2017   Benign essential HTN 10/31/2015   Heart palpitations 10/31/2015   Lung mass 06/27/2013   Edema of extremities 06/27/2013   Pericarditis, viral    History of pericardiectomy 06/19/2013   Chronic pulmonary blastomycosis (HCC) 08/23/2012   Pulmonary nodule, right 03/09/2012   Pericardial effusion/ pericarditis 10/06/2011   PAST MEDICAL HISTORY:  Active Ambulatory  Problems    Diagnosis Date Noted   Pericardial effusion/ pericarditis 10/06/2011   Pulmonary nodule, right 03/09/2012   History of pericardiectomy 06/19/2013   Pericarditis, viral    Unspecified atrial fibrillation (HCC) 07/01/2022   Lung mass 06/27/2013   Edema of extremities 06/27/2013   Benign essential HTN 10/31/2015   Heart palpitations 10/31/2015   LBBB (left bundle branch block) 09/17/2017   Body mass index (BMI) 39.0-39.9, adult 01/05/2022   Chronic pulmonary blastomycosis (HCC) 08/23/2012   Decreased estrogen level 01/05/2022   Prediabetes 01/05/2022   Dry eyes 01/05/2022   History of pericarditis 01/05/2022   Essential hypertension 01/05/2022   Acute hypoxic respiratory failure (HCC) 06/18/2022   Hyponatremia 06/18/2022   Dyspnea 06/20/2022   Acute metabolic encephalopathy 06/30/2022   Hypercalcemia 06/30/2022   Dehydration 06/30/2022   Acute prerenal azotemia 06/30/2022   Transaminitis 06/30/2022   Constipation 06/30/2022   DM2 (diabetes mellitus, type 2) (HCC) 06/30/2022   Allergic rhinitis 06/30/2022   Chronic diastolic CHF (congestive heart failure) (HCC) 06/30/2022   Nausea and vomiting 07/10/2022   Pressure injury of skin 07/10/2022   Urinary retention 07/27/2022   Aspiration pneumonia (HCC) 10/11/2022   Oral candidiasis 10/12/2022   FTT (failure to thrive) in adult 10/12/2022   Generalized weakness 10/12/2022   Malnutrition of moderate degree 10/19/2022   Palliative care encounter 10/21/2022   Weakness of both lower extremities 10/21/2022   DNR (do not resuscitate) 10/21/2022   Goals of  care, counseling/discussion 10/21/2022   Need for emotional support 10/23/2022   Counseling and coordination of care 10/23/2022   Poor appetite 10/23/2022   Altered mental state 10/30/2022   Resolved Ambulatory Problems    Diagnosis Date Noted   Chest pain 07/12/2014   HCAP (healthcare-associated pneumonia) 06/30/2022   Past Medical History:  Diagnosis Date    Cardiac tamponade due to viral pericarditis    Postoperative atrial fibrillation (HCC)    Pulmonary nodule    SOCIAL HX:  Social History   Tobacco Use   Smoking status: Former    Packs/day: 1.50    Years: 10.00    Additional pack years: 0.00    Total pack years: 15.00    Types: Cigarettes    Quit date: 05/19/1979    Years since quitting: 43.4   Smokeless tobacco: Never  Substance Use Topics   Alcohol use: No   FAMILY HX:  Family History  Problem Relation Age of Onset   Hypertension Father    Stroke Father    Hypertension Mother    Ovarian cancer Mother    Prostate cancer Brother    Thyroid disease Sister    Hypertension Sister    Leukemia Maternal Aunt        Preferred Pharmacy: [facility]:  Allergies  Allergen Reactions   Latex Rash   Penicillins Rash and Itching   Tape Hives and Rash   Z-Pak [Azithromycin] Rash and Other (See Comments)    Mouth sores   Dairycare [Lactase-Lactobacillus] Other (See Comments)    intolerance   Egg-Derived Products Nausea And Vomiting   Potassium Nausea And Vomiting   Amoxil [Amoxicillin] Rash   Sulfa Antibiotics Rash     PERTINENT MEDICATIONS:  Outpatient Encounter Medications as of 11/03/2022  Medication Sig   acetaminophen (TYLENOL) 325 MG tablet Take 650 mg by mouth every 6 (six) hours as needed for mild pain or moderate pain.   azelastine (OPTIVAR) 0.05 % ophthalmic solution Apply 1 drop to eye 2 (two) times daily.   cefdinir (OMNICEF) 250 MG/5ML suspension Take 6 mLs (300 mg total) by mouth 2 (two) times daily for 28 days.   food thickener (SIMPLYTHICK, NECTAR/LEVEL 2/MILDLY THICK,) GEL Take 5 packets by mouth as needed.   guaiFENesin-dextromethorphan (ROBITUSSIN DM) 100-10 MG/5ML syrup Take 5 mLs by mouth every 4 (four) hours as needed for cough.   hydrocortisone (ANUSOL-HC) 25 MG suppository Place 1 suppository (25 mg total) rectally 2 (two) times daily.   magic mouthwash SOLN Take 5 mLs by mouth 4 (four) times daily.    metoprolol succinate (TOPROL-XL) 25 MG 24 hr tablet Take 0.5 tablets (12.5 mg total) by mouth daily. (Patient taking differently: Take 12.5 mg by mouth at bedtime.)   Multiple Vitamin (MULTIVITAMIN WITH MINERALS) TABS tablet Take 1 tablet by mouth daily.   Nutritional Supplements (,FEEDING SUPPLEMENT, PROSOURCE PLUS) liquid Take 30 mLs by mouth 3 (three) times daily between meals.   Nutritional Supplements (FEEDING SUPPLEMENT, KATE FARMS STANDARD 1.4,) LIQD liquid Take 325 mLs by mouth 2 (two) times daily between meals.   olopatadine (PATADAY) 0.1 % ophthalmic solution Place 1 drop into both eyes daily as needed for allergies.   pantoprazole sodium (PROTONIX) 40 mg Place 40 mg into feeding tube daily.   polyethylene glycol powder (MIRALAX) 17 GM/SCOOP powder Take 17 g by mouth 2 (two) times daily as needed. Decrease to once daily if having watery stools.   potassium chloride (KLOR-CON) 20 MEQ packet Take 20 mEq by mouth 2 (two)  times daily.   sucralfate (CARAFATE) 1 GM/10ML suspension Take 10 mLs (1 g total) by mouth 4 (four) times daily -  with meals and at bedtime.   triamcinolone (KENALOG) 0.025 % ointment Apply 1 Application topically 2 (two) times daily. Upper back, arms, legs   witch hazel-glycerin (TUCKS) pad Apply topically as needed for itching.   No facility-administered encounter medications on file as of 11/03/2022.    History obtained from review of EMR, discussion with family, facility staff/caregiver and/or patient.   CBC    Component Value Date/Time   WBC 6.8 10/28/2022 1253   RBC 3.76 (L) 10/28/2022 1253   HGB 10.7 (L) 10/28/2022 1253   HCT 33.3 (L) 10/28/2022 1253   PLT 172 10/28/2022 1253   MCV 88.6 10/28/2022 1253   MCH 28.5 10/28/2022 1253   MCHC 32.1 10/28/2022 1253   RDW 18.1 (H) 10/28/2022 1253   LYMPHSABS 1.1 10/11/2022 0606   MONOABS 0.5 10/11/2022 0606   EOSABS 0.1 10/11/2022 0606   BASOSABS 0.0 10/11/2022 0606    CMP    Latest Ref Rng & Units 10/28/2022    12:53 PM 10/27/2022    3:44 AM 10/26/2022    3:45 AM  CMP  Glucose 70 - 99 mg/dL 77  88  409   BUN 8 - 23 mg/dL 12  12  11    Creatinine 0.44 - 1.00 mg/dL 8.11  9.14  7.82   Sodium 135 - 145 mmol/L 141  141  141   Potassium 3.5 - 5.1 mmol/L 3.3  3.2  3.1   Chloride 98 - 111 mmol/L 104  102  101   CO2 22 - 32 mmol/L 30  34  33   Calcium 8.9 - 10.3 mg/dL 8.4  8.2  8.3     LFTs    Latest Ref Rng & Units 10/21/2022    3:33 AM 10/11/2022    6:06 AM 10/01/2022    1:19 PM  Hepatic Function  Total Protein 6.5 - 8.1 g/dL 5.2  5.5  6.4   Albumin 3.5 - 5.0 g/dL 1.5  1.7  2.2   AST 15 - 41 U/L 36  50  49   ALT 0 - 44 U/L 20  24  23    Alk Phosphatase 38 - 126 U/L 168  116  96   Total Bilirubin 0.3 - 1.2 mg/dL 0.4  0.9  0.6     Urinalysis    Component Value Date/Time   COLORURINE STRAW (A) 10/23/2022 1045   APPEARANCEUR CLEAR 10/23/2022 1045   APPEARANCEUR Hazy (A) 08/10/2022 1300   LABSPEC 1.006 10/23/2022 1045   PHURINE 6.0 10/23/2022 1045   GLUCOSEU NEGATIVE 10/23/2022 1045   HGBUR NEGATIVE 10/23/2022 1045   BILIRUBINUR NEGATIVE 10/23/2022 1045   BILIRUBINUR Negative 08/10/2022 1300   KETONESUR 5 (A) 10/23/2022 1045   PROTEINUR NEGATIVE 10/23/2022 1045   UROBILINOGEN 0.2 09/15/2011 0530   NITRITE NEGATIVE 10/23/2022 1045   LEUKOCYTESUR NEGATIVE 10/23/2022 1045    HGB A1c (last 3 results) @LAST3A1c @  BNP (last 3 results) Recent Labs    06/30/22 1406 10/01/22 1319 10/21/22 0852  BNP 28.2 31.0 162.5*   Ref Range & Units 9 d ago (10/28/22) 3 wk ago (10/13/22) 3 wk ago (10/11/22) 4 mo ago (07/02/22) 4 mo ago (07/01/22) 4 mo ago (06/30/22)  pH, Arterial 7.35 - 7.45 7.51 High  7.51 High   7.48 High     pCO2 arterial 32 - 48 mmHg 48 31 Low  45    pO2, Arterial 83 - 108 mmHg 83 64 Low   116 High     Bicarbonate 20.0 - 28.0 mmol/L 38.3 High  24.7 29.2 High  33.5 High  32.7 High  34.8 High   Acid-Base Excess 0.0 - 2.0 mmol/L 13.3 High  2.3 High  4.5 High  8.8 High  7.0  High  10.0 High   O2 Saturation % 99.4 95.3 59.5 100 79 42  Patient temperature 37.0 37.0 37.0 CM 36.8    Comment: Performed at [facility], [address], [place], [place] [address]   10/24/22 CT Chest without Contrast: IMPRESSION: 1. Interval development of posterior left lower lobe collapse/consolidation for pneumonia. There is patchy airspace disease in the pendant right lung base. 2. New small free-flowing left pleural effusion with associated new small to moderate right effusion with apparent loculation. Concerning. 3. Similar mediastinal lymphadenopathy. 4. Enlargement of the pulmonary outflow tract/main pulmonary arteries suggests pulmonary arterial hypertension. 5.  Aortic Atherosclerosis (ICD10-I70.0).  10/26/22 MBS: Narrative & Impression  Modified Barium Swallow Study  Patient Details  Name: CALEA HRIBAR MRN: [id number] Date of Birth: 01-22-1941   Today's Date: 10/26/2022   HPI/PMH: HPI: PRIM MORACE is a 82 y.o. 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  Most of the history is provided by the patient's daughter, as the patient is slightly confused and quite sleepy, though she is interactive.  Daughter who is at the bedside states that patient at baseline is quite sharp, and independent.  However being back in February she was diagnosed with a urinary tract infection, and has been quite weak since that time and was just getting back to feeling like her normal self.  Came back to the ER on the 16th of this month with concerns about bronchitis, was diagnosed with UTI and eventually prescribed antibiotics.  She never seem to get her strength back after this.  Over the last few days, patient has been slightly confused, has been very weak not eating or drinking  very much.  Family also noticed that she seems to choke and cough whenever she tries to eat or drink something.  This has happened to her intermittently in the past, but is more consistent now.  Seen by SLP in February 2024 - dys1/2 and thin liquids. MBS completed 10/14/22 recommending Dys 1, nectar thick liquids.  Pt was recently hospitalized 10/11/22 through 10/30/22 with aspiration pneumonia. Antibiotic treatment was started inpatient and recommended to continue at d/c to complete a 4 weeks course as well as follow up with speech therapy outpatient. Pt has been having recurrent pneumonia, UTI and physical declines per hospital over the prior 4 months.  Daughters indicate that pt was independent in ADLs and driving in January but has had such a huge decline.  Reportedly she has been evaluated with both CT and MRI for possible CVA but none was found. Hospital course was prolonged with respiratory symptoms and hypoxia with initial presentation of confusion, poor intake with coughing/choking and prolonged hypoxia/respiratory distress. She has a hx of chronic pulmonary blastomycosis s/p RML lobectomy at [facility] 2014.  She was noted by GI to have moderately profound oropharyngeal dysphagia probably leading to recurrent aspiration.  She was started per GI on empiric tx with PPI, Carafate and fluconazole. On 6/4, CT scan  of head did not show any evidence of acute CVA that could explain dysphagia.  Pulmonology was consulted (Dr Thora Lance) due to loculation and discussed possible IR thoracentesis or prolonged antibiotics and family chose prolonged antibiotic course.  Pta was treated with IV Rocephin inpatient, switched to Cefdinir suspension at d/c. She completed a 14 day course of Diflucan for possible pharyngeal and esophageal candidiasis and switched to Magic Mouthwash.   Clinical Impression: Clinical Impression: Patient presents with moderate oral and pharyngeal dysphagia with sensorimotor deficits.  Oral transiting  significantly impaired with lingual pumping, poor organization and boluses spilling into pharynx off time poorly controlled.  Attempts at mastication of solids were ineffective with vertical pattern and anterior sulcus.  Patient required pudding as well as liquids to finally transit poorly masticated solid -and this required significant effort.  Secretion retention noted in pharynx that mixed with barium retention resulting in inconsistent silent trace aspiration.  Patient had no sensation to pharyngeal retention and did not try swallow despite cues.  Thin liquid aspiration noted resulting in subtle throat clear but patient did not call adequately when she did cough on command to clear.  Various postures and counting 1-3 to help elicit swallow were not concern affect.  Currently patient is at elevated risk of malnutrition and aspiration.  Optimal diet to mitigate aspiration would be nectar liquid and very creamy pures right now that does not require mastication.  Patient was awake during eval but required frequent stimulation to maintain eye opening.  RN reports she was much more alert today than the last few days.  SLP will follow-up for family education and to ascertain indication for advancement of diet.  At this point we will maximize liquid nutrition IV ensures or Glucerna for energy conservation.  Highly recommend set up oral suction to allow suctioning the patient before and after meals due to secretion retention. SLP recommending continue Dys 1 (puree), nectar thick liquids during meals but allow all types of thin/regular liquids in between meals.   Factors that may increase risk of adverse event in presence of aspiration [name] & [name] 2021): Factors that may increase risk of adverse event in presence of aspiration [name] & [name] 2021): Frail or deconditioned; Dependence for feeding and/or oral hygiene; Weak cough     Recommendations/Plan: Swallowing Evaluation Recommendations Swallowing  Evaluation Recommendations Recommendations: PO diet PO Diet Recommendation: Dysphagia 1 (Pureed); Mildly thick liquids (Level 2, nectar thick) Liquid Administration via: Cup; Straw; Spoon Medication Administration: Crushed with puree Supervision: Full supervision/cueing for swallowing strategies Swallowing strategies  : Slow rate; Small bites/sips; Follow solids with liquids; Avoid mixed consistencies Postural changes: Stay upright 30-60 min after meals; Position pt fully upright for meals Oral care recommendations: Oral care before PO; Staff/trained caregiver to provide oral care Recommended consults: Consider Palliative care Caregiver Recommendations: Have oral suction available   09/17/22 2d echo: 1. Left ventricular EF60 to 65%. LV with normal function and no regional  wall motion abnormalities. Mild concentric left ventricular hypertrophy. Grade I diastolic dysfunction (impaired  relaxation).   2. Right ventricular systolic function and size are normal. Normal pulmonary artery systolic pressure.   3. The mitral valve is normal in structure with no Regurgitation or stenosis.   4. The aortic valve is tricuspid with mild calcification and thickening. Aortic valve  regurgitation is trivial, without stenosis.   5. The inferior vena cava is normal in size with greater than 50% respiratory variability, suggesting right atrial pressure of 3 mmHg.    I reviewed  available labs, medications, imaging, studies and related documents from the EMR.  Records reviewed and summarized above.   Physical Exam: GENERAL: NAD LUNGS: Diminished slightly on 2L [place], no increased work of breathing,  CARDIAC:  S1S2, RRR with no MRG, Trace bipedal edema, No cyanosis ABD:  Normo-active BS x 4 quads, soft, non-tender EXTREMITIES: Leans to right, has ball in right hand to maintain shape. No muscle atrophy/subcutaneous fat loss NEURO:  Noted generalized weakness or cognitive impairment, minimal speech limited to  yes/no answers PSYCH:  non-anxious affect, asleep, arousable but unable to maintain, oriented to name  Thank you for the opportunity to participate in the care of Avis Epley. Please call our main office at [phone] if we can be of additional assistance.    Barbara Man FNP-C  [email or web address]  Phone:  [phone]

## 2022-11-04 DIAGNOSIS — G9340 Encephalopathy, unspecified: Secondary | ICD-10-CM

## 2022-11-04 LAB — PHOSPHORUS
Phosphorus: 1.9 mg/dL — ABNORMAL LOW (ref 2.5–4.6)
Phosphorus: 1.9 mg/dL — ABNORMAL LOW (ref 2.5–4.6)

## 2022-11-04 LAB — BASIC METABOLIC PANEL
Anion gap: 5 (ref 5–15)
BUN: 8 mg/dL (ref 8–23)
CO2: 28 mmol/L (ref 22–32)
Calcium: 8.3 mg/dL — ABNORMAL LOW (ref 8.9–10.3)
Chloride: 116 mmol/L — ABNORMAL HIGH (ref 98–111)
Creatinine, Ser: 0.61 mg/dL (ref 0.44–1.00)
GFR, Estimated: >60 mL/min (ref >60)
Glucose, Bld: 127 mg/dL — ABNORMAL HIGH (ref 70–99)
Potassium: 3 mmol/L — ABNORMAL LOW (ref 3.5–5.1)
Sodium: 149 mmol/L — ABNORMAL HIGH (ref 135–145)

## 2022-11-04 LAB — VITAMIN B12: Vitamin B-12: 860 pg/mL (ref 180–914)

## 2022-11-04 LAB — URINALYSIS, ROUTINE W REFLEX MICROSCOPIC
Bilirubin Urine: NEGATIVE
Glucose, UA: NEGATIVE mg/dL
Hgb urine dipstick: NEGATIVE
Ketones, ur: 5 mg/dL — AB
Leukocytes,Ua: NEGATIVE
Nitrite: NEGATIVE
Protein, ur: 30 mg/dL — AB
Specific Gravity, Urine: 1.01 (ref 1.005–1.030)
pH: 7 (ref 5.0–8.0)

## 2022-11-04 LAB — TSH: TSH: 1.951 u[IU]/mL (ref 0.350–4.500)

## 2022-11-04 LAB — CBC
HCT: 33.2 % — ABNORMAL LOW (ref 36.0–46.0)
Hemoglobin: 10.5 g/dL — ABNORMAL LOW (ref 12.0–15.0)
MCH: 28.9 pg (ref 26.0–34.0)
MCHC: 31.6 g/dL (ref 30.0–36.0)
MCV: 91.5 fL (ref 80.0–100.0)
Platelets: 151 10*3/uL (ref 150–400)
RBC: 3.63 MIL/uL — ABNORMAL LOW (ref 3.87–5.11)
RDW: 18.9 % — ABNORMAL HIGH (ref 11.5–15.5)
WBC: 6 10*3/uL (ref 4.0–10.5)
nRBC: 0 % (ref 0.0–0.2)

## 2022-11-04 LAB — MAGNESIUM: Magnesium: 2 mg/dL (ref 1.7–2.4)

## 2022-11-04 LAB — OSMOLALITY, URINE: Osmolality, Ur: 391 mOsm/kg (ref 300–900)

## 2022-11-04 LAB — OSMOLALITY: Osmolality: 318 mOsm/kg — ABNORMAL HIGH (ref 275–295)

## 2022-11-04 LAB — SODIUM, URINE, RANDOM: Sodium, Ur: 118 mmol/L

## 2022-11-04 MED ORDER — POTASSIUM CHLORIDE 10 MEQ/100ML IV SOLN
10.0000 meq | INTRAVENOUS | Status: AC
  Administered 2022-11-04 (×2): 10 meq via INTRAVENOUS
  Filled 2022-11-04 (×2): qty 100

## 2022-11-04 MED ORDER — DEXTROSE 5 % IV SOLN: INTRAVENOUS | Status: DC

## 2022-11-04 MED ORDER — METOPROLOL TARTRATE 5 MG/5ML IV SOLN: 5.0000 mg | INTRAVENOUS | Status: DC | PRN

## 2022-11-04 MED ORDER — GERHARDT'S BUTT CREAM
TOPICAL_CREAM | Freq: Two times a day (BID) | CUTANEOUS | Status: DC
  Administered 2022-11-06 – 2022-11-10 (×3): 1 via TOPICAL
  Filled 2022-11-04: qty 1

## 2022-11-04 MED ORDER — POTASSIUM PHOSPHATES 15 MMOLE/5ML IV SOLN
30.0000 mmol | Freq: Once | INTRAVENOUS | Status: AC
  Administered 2022-11-04: 30 mmol via INTRAVENOUS
  Filled 2022-11-04: qty 10

## 2022-11-04 MED ORDER — POTASSIUM CHLORIDE 10 MEQ/100ML IV SOLN: 10.0000 meq | INTRAVENOUS | Status: DC

## 2022-11-04 MED ORDER — IPRATROPIUM-ALBUTEROL 0.5-2.5 (3) MG/3ML IN SOLN: 3.0000 mL | RESPIRATORY_TRACT | Status: DC | PRN

## 2022-11-04 MED ORDER — HYDRALAZINE HCL 20 MG/ML IJ SOLN: 10.0000 mg | INTRAMUSCULAR | Status: DC | PRN

## 2022-11-04 MED ORDER — POTASSIUM CHLORIDE 10 MEQ/100ML IV SOLN
10.0000 meq | Freq: Once | INTRAVENOUS | Status: AC
  Administered 2022-11-04: 10 meq via INTRAVENOUS
  Filled 2022-11-04: qty 100

## 2022-11-04 MED ORDER — ONDANSETRON HCL 4 MG/2ML IJ SOLN: 4.0000 mg | Freq: Four times a day (QID) | INTRAMUSCULAR | Status: DC | PRN

## 2022-11-04 MED ORDER — DEXTROSE-SODIUM CHLORIDE 5-0.45 % IV SOLN: INTRAVENOUS | Status: DC

## 2022-11-04 NOTE — Consult Note (Signed)
WOC Nurse Consult Note: Reason for Consult: Stage 2 Pressure Injury Wound type: ICD; irritant contact dermatitis Patient was just release 6/14 and readmitted 6/17 for dehydration. She had ICD from urinary and bowel incontinence at that time. Noted nursing FS indicates "redness" similar presentation to recent admission Pressure Injury POA: NA Wound bed: redness Drainage (amount, consistency, odor) none Periwound:intact Dressing procedure/placement/frequency: Gerhardt's butt cream to the affected areas BID  OR can use silicone foam to protect areas if patient has bladder and bowel incontinence managed  Turn and reposition at least every 2 hours.   Re consult if needed, will not follow at this time. Thanks  Karron Goens [employer], RN,CWOCN, CNS, CWON-AP [phone])

## 2022-11-04 NOTE — Evaluation (Signed)
Physical Therapy Evaluation Patient Details Name: Barbara Nelson MRN: [id number] DOB: 1940-10-22 Today's Date: 11/04/2022  History of Present Illness  Patient is a 82 year old female who presented to ED on 6/18/204  from SNF due to dehydration and acute encephalpathy. pt was recemty d/c from [facility] on 6/14 following hospitalization  due to decreased urinary output, difficulty with mobility and swallowing. Patient was admitted with aspiration pneumonia, hyponatremia, PMH: HTN, recent UTI.  Clinical Impression      Pt admitted with above diagnosis.  Pt currently with functional limitations due to the deficits listed below (see PT Problem List). Pt presented to therapy in bed, pt lethargic. Family present and able to provide insight to PLOF. Pt exhibits difficulty following one step commands, demonstrates minimal initiation for movement or participation with therapy, minimal verbal communication and limited EO t/o Eval. Pt required extensive assist for all bed mobility including rolling and supine <> sit. PT recommends maxi move for transfers and nursing staff as well as family is aware. PT focus on sitting balance EOB with pt requiring varying degrees of assist from max to min guard for ~ 10-15 seconds with B LE support and no UE support with multimodal cues and pt is unable to demonstrate ability to implement self righting strategies with LOB regardless of cues, noted strong R lateral and posterior lean. Pt left in bed, all needs in place, use of pillows as props to offload R side and position B LE, family present. Pt will benefit from acute skilled PT in current and next venue to increase their independence and safety with mobility to allow discharge.      Recommendations for follow up therapy are one component of a multi-disciplinary discharge planning process, led by the attending physician.  Recommendations may be updated based on patient status, additional functional criteria and insurance  authorization.  Follow Up Recommendations Can patient physically be transported by private vehicle: No     Assistance Recommended at Discharge Frequent or constant Supervision/Assistance  Patient can return home with the following  Two people to help with walking and/or transfers;Two people to help with bathing/dressing/bathroom;Direct supervision/assist for medications management;Help with stairs or ramp for entrance;Assistance with cooking/housework;Assist for transportation;Assistance with feeding;Direct supervision/assist for financial management    Equipment Recommendations None recommended by PT  Recommendations for Other Services       Functional Status Assessment Patient has had a recent decline in their functional status and demonstrates the ability to make significant improvements in function in a reasonable and predictable amount of time.     Precautions / Restrictions Precautions Precautions: Fall Precaution Comments: aspiration Restrictions Weight Bearing Restrictions: No      Mobility  Bed Mobility Overal bed mobility: Needs Assistance Bed Mobility: Supine to Sit, Sit to Supine, Rolling Rolling: Total assist Sidelying to sit: Total assist Supine to sit: Total assist, +2 for physical assistance, +2 for safety/equipment (pt 0%) Sit to supine: Total assist Sit to sidelying: Total assist General bed mobility comments: pt demonstrated minimal initiation of movement for supine <> sit, requiring extensive assist and multimodal cues    Transfers                   General transfer comment: pt is a MAXI MOVE transfer and nursing staff is aware (PT unable to assess STS from EOB per family request and pt lethargy)    Ambulation/Gait               General Gait Details: 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 pt able to progress to mod A for sitting balance with one episode of sitting balance with min guard for 10-15s. pt unable to implement self recovery stratagies with posteiror lateral LOB regardless of ceus with total assist to recover. Postural control: Posterior lean, Right lateral lean                                   Pertinent Vitals/Pain Pain Assessment Pain Assessment: Faces Faces Pain Scale: Hurts little more Pain Location: at times grimacing, unable to communicate pain location or severity Pain Descriptors / Indicators: Grimacing Pain Intervention(s): Monitored during session, Limited activity within patient's tolerance    Home Living Family/patient expects to be discharged to:: Skilled nursing facility Living Arrangements: Children Available Help at Discharge: Family;Personal care attendant Type of Home: House Home Access: Stairs to enter Entrance Stairs-Rails: Can reach both Entrance Stairs-Number of Steps: 1 Alternate Level Stairs-Number of Steps: pt stays on first level Home Layout: Multi-level;Able to live on main level with bedroom/bathroom Home Equipment: Toilet riser;Grab bars - toilet;Grab bars - tub/shower;Tub bench;Rolling Walker (2 wheels);Wheelchair - manual Additional Comments: doing HHPT and HHOT prior to admission    Prior Function Prior Level of Function : Needs assist             Mobility Comments: Pt amb with  RW  at baseline/with HHPT- less mobile recently per dtrs- since ~March 2024; goes from bed to w/c for breakfast then to  liftchair ADLs Comments: requires assist with ADLs; wears depends     Hand Dominance   Dominant Hand: Right    Extremity/Trunk Assessment   Upper Extremity Assessment Upper Extremity Assessment: Difficult to assess due to impaired cognition RUE Deficits / Details: performed PROM, pt used squeeze balls, elevated on pillow due to edema. patient unable to follow commands for MMT. poor intiation to attempt to use hand bilaterally. LUE Deficits / Details: same at R    Lower Extremity Assessment Lower Extremity Assessment: Defer to PT evaluation RLE Deficits / Details: lethargic, hx of B LE pain R knee    Cervical / Trunk Assessment Cervical / Trunk Assessment: Kyphotic  Communication   Communication: No difficulties  Cognition Arousal/Alertness: Lethargic Behavior During Therapy: Flat affect Overall Cognitive Status: Difficult to assess Area of Impairment: Attention, Following commands                 Orientation Level: Disoriented to, Place, Time, Situation     Following Commands: Follows one step commands inconsistently     Problem Solving: Slow processing, Requires verbal cues, Requires tactile cues, Difficulty sequencing, Decreased initiation General Comments: Only brief moments of responsiveness. minimal verbal communication and intermittent EO with commands only        General Comments      Exercises     Assessment/Plan  PT Assessment Patient needs continued PT services  PT Problem List Decreased strength;Decreased activity tolerance;Decreased balance;Decreased mobility;Decreased cognition       PT Treatment Interventions Therapeutic exercise;DME instruction;Gait training;Functional mobility training;Therapeutic activities;Patient/family education;Balance training    PT Goals (Current goals can be found in the Care Plan section)  Acute Rehab PT Goals PT Goal Formulation: Patient unable to participate in goal setting Time For Goal Achievement:  11/18/22 Potential to Achieve Goals: Fair    Frequency Min 1X/week     Co-evaluation   Reason for Co-Treatment: To address functional/ADL transfers;For patient/therapist safety PT goals addressed during session: Mobility/safety with mobility OT goals addressed during session: ADL's and self-care       AM-PAC PT "6 Clicks" Mobility  Outcome Measure Help needed turning from your back to your side while in a flat bed without using bedrails?: Total Help needed moving from lying on your back to sitting on the side of a flat bed without using bedrails?: Total Help needed moving to and from a bed to a chair (including a wheelchair)?: Total Help needed standing up from a chair using your arms (e.g., wheelchair or bedside chair)?: Total Help needed to walk in hospital room?: Total Help needed climbing 3-5 steps with a railing? : Total 6 Click Score: 6    End of Session Equipment Utilized During Treatment: Oxygen Activity Tolerance: Patient limited by lethargy;Patient limited by pain Patient left: in bed;with bed alarm set;with family/visitor present Nurse Communication: Mobility status;Need for lift equipment PT Visit Diagnosis: Other abnormalities of gait and mobility (R26.89);Muscle weakness (generalized) (M62.81)    Time: 1137-1209 PT Time Calculation (min) (ACUTE ONLY): 32 min   Charges:   PT Evaluation $PT Eval Moderate Complexity: 1 Barbara Nelson, PT Acute Rehab   Barbara Nelson 11/04/2022, 12:44 PM

## 2022-11-04 NOTE — TOC Initial Note (Signed)
Transition of Care [facility]) - Initial/Assessment Note    Patient Details  Name: Barbara Nelson MRN: [id number] Date of Birth: 1940-07-19  [facility] CM/SW Contact:    Beckie Busing, RN Phone [phone]  11/04/2022, 2:41 PM  Clinical Narrative:                 TOC following patient admitted from SNF. Patient recently d/c on 6/14 to [facility] SNF. Patient readmitted due to not eating/drinking, decreased LOC CM has verified with daughter Talissa Lahti that patient will be returning to SNF. CM attempted to verify that patient is ok to return. There is no answer, voicemail has been left for [facility]. TOC will continue to monitor for disposition needs.   Expected Discharge Plan: Skilled Nursing Facility Barriers to Discharge: Continued Medical Work up   Patient Goals and CMS Choice Patient states their goals for this hospitalization and ongoing recovery are:: To get better CMS Medicare.gov Compare Post Acute Care list provided to::  (n/a family wants to return to same facility) Choice offered to / list presented to : Adult Children, Patient (family wants patient to return to facility that she came from)      Expected Discharge Plan and Services In-house Referral: NA Discharge Planning Services: CM Consult   Living arrangements for the past 2 months: Skilled Nursing Facility (From [place])                 DME Arranged: N/A DME Agency: NA                  Prior Living Arrangements/Services Living arrangements for the past 2 months: Skilled Holiday representative (From [employer]) Lives with:: Other (Comment) (Currently in SNF for short term rehab) Patient language and need for interpreter reviewed:: Yes Do you feel safe going back to the place where you live?: Yes      Need for Family Participation in Patient Care: Yes (Comment) Care giver support system in place?: Yes (comment) Current home services:  (n/a) Criminal Activity/Legal  Involvement Pertinent to Current Situation/Hospitalization: No - Comment as needed  Activities of Daily Living Home Assistive Devices/Equipment: Environmental consultant (specify type) ADL Screening (condition at time of admission) Patient's cognitive ability adequate to safely complete daily activities?: Yes Is the patient deaf or have difficulty hearing?: Yes Does the patient have difficulty seeing, even when wearing glasses/contacts?: No Does the patient have difficulty concentrating, remembering, or making decisions?: No Patient able to express need for assistance with ADLs?: No Does the patient have difficulty dressing or bathing?: Yes Independently performs ADLs?: No Does the patient have difficulty walking or climbing stairs?: Yes Weakness of Legs: Both Weakness of Arms/Hands: None  Permission Sought/Granted Permission sought to share information with : Family Supports                Emotional Assessment Appearance:: Appears stated age Attitude/Demeanor/Rapport: Lethargic Affect (typically observed): Unable to Assess (patient sleeping soundly) Orientation: : Oriented to Self Alcohol / Substance Use: Not Applicable Psych Involvement: No (comment)  Admission diagnosis:  Dehydration [E86.0] Hypokalemia [E87.6] Acute encephalopathy [G93.40] Patient Active Problem List   Diagnosis Date Noted   Acute encephalopathy 11/03/2022   Hypernatremia 11/03/2022   Hypokalemia 11/03/2022   Chronic respiratory failure with hypoxia (HCC) 11/03/2022   Dysphagia 11/03/2022   Altered mental state 10/30/2022   Need for emotional support 10/23/2022   Counseling and coordination of care 10/23/2022   Poor appetite 10/23/2022   Palliative care encounter 10/21/2022  Weakness of both lower extremities 10/21/2022   DNR (do not resuscitate) 10/21/2022   Goals of care, counseling/discussion 10/21/2022   Malnutrition of moderate degree 10/19/2022   Oral candidiasis 10/12/2022   FTT (failure to thrive) in  adult 10/12/2022   Generalized weakness 10/12/2022   Aspiration pneumonia (HCC) 10/11/2022   Urinary retention 07/27/2022   Nausea and vomiting 07/10/2022   Pressure injury of skin 07/10/2022   Unspecified atrial fibrillation (HCC) 07/01/2022   Acute metabolic encephalopathy 06/30/2022   Hypercalcemia 06/30/2022   Dehydration 06/30/2022   Acute prerenal azotemia 06/30/2022   Transaminitis 06/30/2022   Constipation 06/30/2022   DM2 (diabetes mellitus, type 2) (HCC) 06/30/2022   Allergic rhinitis 06/30/2022   Chronic diastolic CHF (congestive heart failure) (HCC) 06/30/2022   Dyspnea 06/20/2022   Acute hypoxic respiratory failure (HCC) 06/18/2022   Hyponatremia 06/18/2022   Body mass index (BMI) 39.0-39.9, adult 01/05/2022   Decreased estrogen level 01/05/2022   Prediabetes 01/05/2022   Dry eyes 01/05/2022   History of pericarditis 01/05/2022   Essential hypertension 01/05/2022   LBBB (left bundle branch block) 09/17/2017   Benign essential HTN 10/31/2015   Heart palpitations 10/31/2015   Lung mass 06/27/2013   Edema of extremities 06/27/2013   Pericarditis, viral    History of pericardiectomy 06/19/2013   Chronic pulmonary blastomycosis (HCC) 08/23/2012   Pulmonary nodule, right 03/09/2012   Pericardial effusion/ pericarditis 10/06/2011   PCP:  [name], MD Pharmacy:   [id number] - [place], [place] - [address] [place] [address] Phone: [phone] Fax: [phone]     Social Determinants of Health (SDOH) Social History: SDOH Screenings   Food Insecurity: No Food Insecurity (11/03/2022)  Housing: Low Risk  (11/03/2022)  Transportation Needs: No Transportation Needs (11/03/2022)  Utilities: Not At Risk (11/03/2022)  Tobacco Use: Medium Risk (11/03/2022)   SDOH Interventions:     Readmission Risk Interventions    11/04/2022    2:18 PM  Readmission Risk Prevention Plan  Transportation Screening Complete  PCP or  Specialist Appt within 3-5 Days Complete  HRI or Home Care Consult Complete  Social Work Consult for Recovery Care Planning/Counseling Complete  Palliative Care Screening Not Applicable  Medication Review Oceanographer) Referral to Pharmacy

## 2022-11-04 NOTE — Plan of Care (Signed)
°  Problem: Education: °Goal: Knowledge of General Education information will improve °Description: Including pain rating scale, medication(s)/side effects and non-pharmacologic comfort measures °Outcome: Not Progressing °  °Problem: Health Behavior/Discharge Planning: °Goal: Ability to manage health-related needs will improve °Outcome: Not Progressing °  °Problem: Clinical Measurements: °Goal: Ability to maintain clinical measurements within normal limits will improve °Outcome: Progressing °  °

## 2022-11-04 NOTE — Evaluation (Signed)
Occupational Therapy Evaluation Patient Details Name: Barbara Nelson MRN: [id number] DOB: January 25, 1941 Today's Date: 11/04/2022   History of Present Illness Patient is a 82 year old female who presented to ED on 6/18/204  from SNF due to dehydration and acute encephalpathy. pt was recemty d/c from [facility] on 6/14 following hospitalization  due to decreased urinary output, difficulty with mobility and swallowing. Patient was admitted with aspiration pneumonia, hyponatremia, PMH: HTN, recent UTI.   Clinical Impression   Patient is a 82 year old female who was admitted for above. Patient was living at home with family support at w/c and RW level. Currently, patient is TD +2 for bed mobility and ADL tasks with patient having difficult time consistently follow directions. Patient is lethargic but noted to have eyes open for about 40% of session with consistent encouragement. Patient declined attempts to stand today. Patient was noted to have decreased functional activity tolernace, decreased ROM, decreased BUE strength, decreased endurance, decreased sitting balance, decreased standing balanced, decreased safety awareness, and decreased knowledge of AE/AD impacting participation in ADLs. Patient would continue to benefit from skilled OT services at this time while admitted and after d/c to address noted deficits in order to improve overall safety and independence in ADLs.       Recommendations for follow up therapy are one component of a multi-disciplinary discharge planning process, led by the attending physician.  Recommendations may be updated based on patient status, additional functional criteria and insurance authorization.   Assistance Recommended at Discharge Frequent or constant Supervision/Assistance  Patient can return home with the following Two people to help with walking and/or transfers;Two people to help with bathing/dressing/bathroom;Assistance with cooking/housework;Direct  supervision/assist for medications management;Assist for transportation;Help with stairs or ramp for entrance;Direct supervision/assist for financial management;Assistance with feeding    Functional Status Assessment  Patient has had a recent decline in their functional status and demonstrates the ability to make significant improvements in function in a reasonable and predictable amount of time.  Equipment Recommendations  Other (comment) (total lift)       Precautions / Restrictions Precautions Precautions: Fall Precaution Comments: aspiration Restrictions Weight Bearing Restrictions: No      Mobility Bed Mobility Overal bed mobility: Needs Assistance Bed Mobility: Supine to Sit, Sit to Supine, Rolling Rolling: Total assist   Supine to sit: Total assist, +2 for physical assistance, +2 for safety/equipment   Sit to sidelying: Total assist General bed mobility comments: Total Assist + 2 pt 0% to transfer from supine to EOB to attempt to increase alertness.  Unsuccessful. Remains lethargic.  Eyes shut.  RN attempted to spoon feed pt apple sauce with meds unsuccessful.  Pt not opening her mouth.  Required Total Assist to prevent posterior LOB while seated EOB.  Poor collapsed posture and head downward.  Applied MAXI MOVE sling while seated.          Balance Overall balance assessment: Needs assistance Sitting-balance support: Feet supported, Bilateral upper extremity supported Sitting balance-Leahy Scale: Poor Sitting balance - Comments: less than 5% effort from patient during session Postural control: Posterior lean, Left lateral lean         ADL either performed or assessed with clinical judgement   ADL Overall ADL's : Needs assistance/impaired Eating/Feeding: Maximal assistance;Bed level   Grooming: Sitting;Maximal assistance Grooming Details (indicate cue type and reason): patient unable to follow cues to touch nose sitting EOB with increased time. Upper Body  Bathing: Maximal assistance;Bed level   Lower Body Bathing: Bed level;Total assistance  Upper Body Dressing : Maximal assistance;Bed level   Lower Body Dressing: Total assistance;Bed level     Toilet Transfer Details (indicate cue type and reason): unable to tolerate standing attempt with strong posterior leaning sitting EOB. patient was noted to have pushing repsonse to R to lie back down. patients family reported patient was able to get bottom to clear bed a few inches at therapy. when STEDY was offered daughter declined and then patient reported she did not want to try to stand. patient transitioned back to bed. Toileting- Clothing Manipulation and Hygiene: Total assistance;Bed level;+2 for physical assistance;Maximal assistance;+2 for safety/equipment         General ADL Comments: patient was awake during session with eyes open for about 40% of session. patient looked at therapist reporting " i want to go" during session. patient did not clarify where when further asked.     Vision   Additional Comments: difficult to assess formally with patient keeping eyes closed during majoirty of session. patient did have moments of eyes open with patient staring off into distance. patient able to look at therapist when speaking to her occasionally.            Pertinent Vitals/Pain Pain Assessment Pain Assessment: Faces Faces Pain Scale: Hurts even more Pain Location: legs with movement Pain Descriptors / Indicators: Grimacing, Moaning Pain Intervention(s): Limited activity within patient's tolerance, Monitored during session, Patient requesting pain meds-RN notified, Repositioned     Hand Dominance Right   Extremity/Trunk Assessment Upper Extremity Assessment Upper Extremity Assessment: Difficult to assess due to impaired cognition RUE Deficits / Details: performed PROM, pt used squeeze balls, elevated on pillow due to edema. patient unable to follow commands for MMT. poor intiation to  attempt to use hand bilaterally. LUE Deficits / Details: same at R   Lower Extremity Assessment Lower Extremity Assessment: Defer to PT evaluation RLE Deficits / Details: lethargic, hx of B LE pain R knee   Cervical / Trunk Assessment Cervical / Trunk Assessment: Kyphotic   Communication Communication Communication: No difficulties   Cognition Arousal/Alertness: Lethargic Behavior During Therapy: Flat affect Overall Cognitive Status: Difficult to assess Area of Impairment: Attention, Following commands         Following Commands: Follows one step commands inconsistently     Problem Solving: Slow processing, Requires verbal cues, Requires tactile cues, Difficulty sequencing, Decreased initiation General Comments: Only brief moments of responsiveness.  99% eyes are closed.  Responded better to MD.  Appears to have "selected" response.                Home Living Family/patient expects to be discharged to:: Skilled nursing facility Living Arrangements: Children Available Help at Discharge: Family;Personal care attendant Type of Home: House Home Access: Stairs to enter [employer] of Steps: 1 Entrance Stairs-Rails: Can reach both Home Layout: Multi-level;Able to live on main level with bedroom/bathroom Alternate Level Stairs-Number of Steps: pt stays on first level             Home Equipment: Toilet riser;Grab bars - toilet;Grab bars - tub/shower;Tub bench;Rolling Walker (2 wheels);Wheelchair - manual   Additional Comments: doing HHPT and HHOT prior to admission      Prior Functioning/Environment Prior Level of Function : Needs assist             Mobility Comments: Pt amb with  RW  at baseline/with HHPT- less mobile recently per dtrs- since ~March 2024; goes from bed to w/c for breakfast then to liftchair ADLs Comments:  requires assist with ADLs; wears depends        OT Problem List: Decreased strength;Decreased activity tolerance;Impaired  balance (sitting and/or standing);Decreased coordination;Decreased safety awareness;Decreased knowledge of precautions;Pain;Impaired UE functional use;Decreased cognition;Decreased knowledge of use of DME or AE      OT Treatment/Interventions: Self-care/ADL training;Energy conservation;Therapeutic exercise;DME and/or AE instruction;Therapeutic activities;Patient/family education;Balance training    OT Goals(Current goals can be found in the care plan section) Acute Rehab OT Goals Patient Stated Goal: none stated OT Goal Formulation: Patient unable to participate in goal setting Time For Goal Achievement: 11/18/22 Potential to Achieve Goals: Fair  OT Frequency: Min 2X/week    Co-evaluation PT/OT/SLP Co-Evaluation/Treatment: Yes Reason for Co-Treatment: To address functional/ADL transfers;For patient/therapist safety PT goals addressed during session: Mobility/safety with mobility OT goals addressed during session: ADL's and self-care      AM-PAC OT "6 Clicks" Daily Activity     Outcome Measure Help from another person eating meals?: Total Help from another person taking care of personal grooming?: Total Help from another person toileting, which includes using toliet, bedpan, or urinal?: Total Help from another person bathing (including washing, rinsing, drying)?: Total Help from another person to put on and taking off regular upper body clothing?: Total Help from another person to put on and taking off regular lower body clothing?: Total 6 Click Score: 6   End of Session Nurse Communication: Other (comment) (ok to participate in session)  Activity Tolerance: Patient limited by lethargy Patient left: in bed;with call bell/phone within reach;with family/visitor present  OT Visit Diagnosis: Pain;Muscle weakness (generalized) (M62.81);Other symptoms and signs involving the nervous system (R29.898)                Time: 1137-1209 OT Time Calculation (min): 32 min Charges:  OT General  Charges $OT Visit: 1 Visit OT Evaluation $OT Eval Moderate Complexity: 1 Mod  Brunilda Eble OTR/L, MS Acute Rehabilitation Department Office# [phone]   Selinda Flavin 11/04/2022, 12:54 PM

## 2022-11-04 NOTE — Progress Notes (Signed)
PROGRESS NOTE    Barbara Nelson  WGN:[id number] DOB: 1940/06/10 DOA: 11/03/2022 PCP: [name], MD   Brief Narrative:  82 year old with history of chronic pulmonary blasto cytosis status post lobectomy 2014, chronic hypoxia, recurrent admissions for UTI, pneumonia, dysphagia with recurrent aspiration, HTN brought to the hospital from skilled nursing facility for altered mental status.  Apparently patient has been having severe dysphagia at the facility with poor oral nutrition.  Now there is concerns of dehydration therefore brought to the hospital.  Upon admission chest x-ray was overall unremarkable, CT of the head was negative.  There was clinical signs of dehydration with hypernatremia   Assessment & Plan:  Principal Problem:   Acute encephalopathy Active Problems:   Chronic pulmonary blastomycosis (HCC)   Essential hypertension   Dehydration   FTT (failure to thrive) in adult   Hypernatremia   Hypokalemia   Chronic respiratory failure with hypoxia (HCC)   Dysphagia     Acute metabolic encephalopathy, toxic - Pt more lethargic and confused over the past 48 hours  - No acute findings on head CT, no evidence for infection (UA not yet resulted)  - Likely d/t dehydration and metabolic derangements  - Continue IVF hydration and correction of electrolyte abnormalities, use delirium precautions, supportive care   Ammonia, TSH are normal. Checking Folate and B12.    Dysphagia  Odynophagia - There was concern for worsening dysphagia at the SNF and oral nutrition was being held per EMS report  -Currently remains n.p.o. -Recently completed course of 14 days of Diflucan -Continue PPI and Carafate  Unfortunately patient will have this chronic oropharyngeal dysphagia which will likely remain chronic at this point.  I have discussed with family regarding long-term nutrition.  Although I have advised against long-term feeding tube at this point as it does not prevent any risk of  aspirations, I will ask her all the questions.  Family would like to think about this.   Hypernatremia/hypokalemia This is from dehydration, getting IV fluids.  Replete electrolytes. Continue to monitor phosphorus and magnesium as well    Chronic hypoxic respiratory failure; recent pneumonia with parapneumonic effusion   Recently discharged on cefdinir for total of 28 days, last day 11/27/2022.   Essential hypertension Toprol-XL.  IV as needed   Urinary retention  - In and out cath as needed for now  Severe protein calorie malnutrition Adult failure to thrive Severe dysphagia with recurrent aspirations supportive care    DVT prophylaxis: Lovenox Code Status: DNR Family Communication: Daughter is at bedside Continue hospital stay for IV fluids, still severely dehydrated requiring electrolyte management       Diet Orders (From admission, onward)     Start     Ordered   11/03/22 2206  Diet NPO time specified  Diet effective now        11/03/22 2207            Subjective: Seen and examined at bedside, appears little better per family but still chronically appears very weak.  Discussed with family that this issue might be recurrent going forward.  We can either try comfort feeding or modified feeding versus advanced feeding tube.  Although I am advising against advanced feeding tube, family would like to think about this.  They have already met with palliative care at the facility  Examination:  General exam: Appears calm and comfortable, generally ill and frail Respiratory system: Clear to auscultation. Respiratory effort normal. Cardiovascular system: S1 & S2 heard, RRR. No JVD,  murmurs, rubs, gallops or clicks. No pedal edema. Gastrointestinal system: Abdomen is nondistended, soft and nontender. No organomegaly or masses felt. Normal bowel sounds heard. Central nervous system: Alert and oriented. No focal neurological deficits. Extremities: Symmetric 5 x 5  power. Skin: No rashes, lesions or ulcers Psychiatry: Judgement and insight appear normal. Mood & affect appropriate.  Objective: Vitals:   11/03/22 2300 11/03/22 2342 11/04/22 0420 11/04/22 0822  BP: (!) 154/97 (!) 150/86 (!) 162/96 (!) 152/90  Pulse: 82 82 79 81  Resp: 17 16 16 18   Temp:  (!) 97.3 F (36.3 C) (!) 97.3 F (36.3 C) (!) 97.4 F (36.3 C)  TempSrc:  Axillary Axillary Oral  SpO2: 95% 96% 97% 100%  Weight:      Height:        Intake/Output Summary (Last 24 hours) at 11/04/2022 0835 Last data filed at 11/04/2022 0217 Gross per 24 hour  Intake 0 ml  Output 550 ml  Net -550 ml   Filed Weights   11/03/22 1902  Weight: 86.2 kg    Scheduled Meds:  cefdinir  300 mg Oral BID   enoxaparin (LOVENOX) injection  40 mg Subcutaneous Q24H   ketotifen  1 drop Both Eyes BID   metoprolol succinate  12.5 mg Oral QPM   pantoprazole (PROTONIX) IV  40 mg Intravenous Q24H   sucralfate  1 g Oral TID WC & HS   Continuous Infusions:  dextrose 5 % and 0.45 % NaCl with KCl 20 mEq/L 100 mL/hr at 11/04/22 0033    Nutritional status     Body mass index is 30.67 kg/m.  Data Reviewed:   CBC: Recent Labs  Lab 10/28/22 1253 11/03/22 2020 11/04/22 0813  WBC 6.8 6.3 6.0  NEUTROABS  --  4.0  --   HGB 10.7* 11.0* 10.5*  HCT 33.3* 34.5* 33.2*  MCV 88.6 87.8 91.5  PLT 172 174 151   Basic Metabolic Panel: Recent Labs  Lab 10/28/22 1253 11/03/22 2020  NA 141 150*  K 3.3* 2.6*  CL 104 115*  CO2 30 31  GLUCOSE 77 95  BUN 12 10  CREATININE 0.67 0.69  CALCIUM 8.4* 8.8*  MG  --  2.4   GFR: Estimated Creatinine Clearance: 60 mL/min (by C-G formula based on SCr of 0.69 mg/dL). Liver Function Tests: Recent Labs  Lab 11/03/22 2020  AST 44*  ALT 25  ALKPHOS 123  BILITOT 0.7  PROT 6.3*  ALBUMIN 1.7*   No results for input(s): "LIPASE", "AMYLASE" in the last 168 hours. Recent Labs  Lab 11/03/22 2020  AMMONIA 14   Coagulation Profile: No results for input(s):  "INR", "PROTIME" in the last 168 hours. Cardiac Enzymes: No results for input(s): "CKTOTAL", "CKMB", "CKMBINDEX", "TROPONINI" in the last 168 hours. BNP (last 3 results) No results for input(s): "PROBNP" in the last 8760 hours. HbA1C: No results for input(s): "HGBA1C" in the last 72 hours. CBG: No results for input(s): "GLUCAP" in the last 168 hours. Lipid Profile: No results for input(s): "CHOL", "HDL", "LDLCALC", "TRIG", "CHOLHDL", "LDLDIRECT" in the last 72 hours. Thyroid Function Tests: Recent Labs    11/03/22 2020  TSH 1.532   Anemia Panel: No results for input(s): "VITAMINB12", "FOLATE", "FERRITIN", "TIBC", "IRON", "RETICCTPCT" in the last 72 hours. Sepsis Labs: Recent Labs  Lab 11/03/22 2020  LATICACIDVEN 1.0    No results found for this or any previous visit (from the past 240 hour(s)).       Radiology Studies: [facility] Chest Port 1  View  Result Date: 11/03/2022 CLINICAL DATA:  AMS EXAM: PORTABLE CHEST - 1 VIEW COMPARISON:  10/20/2022 FINDINGS: For significant improvement in right upper lung airspace disease. Slight decrease in left retrocardiac consolidation/atelectasis. Small residual pleural effusions. Stable mild cardiomegaly. No pneumothorax. Visualized bones unremarkable. Residual contrast in the splenic flexure of the colon. IMPRESSION: Improving right upper lung and left retrocardiac airspace disease. Electronically Signed   By: Corlis Leak M.D.   On: 11/03/2022 20:12   CT HEAD WO CONTRAST  Result Date: 11/03/2022 CLINICAL DATA:  Mental status change EXAM: CT HEAD WITHOUT CONTRAST TECHNIQUE: Contiguous axial images were obtained from the base of the skull through the vertex without intravenous contrast. RADIATION DOSE REDUCTION: This exam was performed according to the departmental dose-optimization program which includes automated exposure control, adjustment of the mA and/or kV according to patient size and/or use of iterative reconstruction technique. COMPARISON:   Head CT 10/20/2022 FINDINGS: Brain: No evidence of acute infarction, hemorrhage, hydrocephalus, extra-axial collection or mass lesion/mass effect. There is mild diffuse atrophy and mild periventricular white matter hypodensity, unchanged from prior. Vascular: No hyperdense vessel or unexpected calcification. Skull: Normal. Negative for fracture or focal lesion. Sinuses/Orbits: No acute finding. Other: None. IMPRESSION: 1. No acute intracranial abnormality. 2. Mild diffuse atrophy and mild periventricular white matter hypodensity, unchanged from prior. Electronically Signed   By: Darliss Cheney M.D.   On: 11/03/2022 20:01           LOS: 1 day   Time spent= 35 mins    Malicia Blasdel Joline Maxcy, MD [facility]  If 7PM-7AM, please contact night-coverage  11/04/2022, 8:35 AM

## 2022-11-04 NOTE — Evaluation (Signed)
Clinical/Bedside Swallow Evaluation Patient Details  Name: Barbara Nelson MRN: [id number] Date of Birth: [birth year]  Today's Date: 11/04/2022 Time: SLP Start Time (ACUTE ONLY): 0935 SLP Stop Time (ACUTE ONLY): 0950 SLP Time Calculation (min) (ACUTE ONLY): 15 min  Past Medical History:  Past Medical History:  Diagnosis Date   Benign essential HTN 10/31/2015   Cardiac tamponade due to viral pericarditis    LBBB (left bundle branch block)    Lung mass    fungal infection s/p resection at [facility]   Postoperative atrial fibrillation [facility])    Pulmonary nodule    Past Surgical History:  Past Surgical History:  Procedure Laterality Date   ABDOMINAL HYSTERECTOMY  1988   chest tube placement  09/03/11   LUNG REMOVAL, PARTIAL     right knee arthoscopy with meniscal repair     subxiphoid pericardial window and drainage anterior partial pericardiectomy  09/03/11   HPI:  Patient is an 82 y.o. female with PMH: chronic pulmonary blastomycosis status post lobectomy in 2014, chronic hypoxic respiratory failure, recurrent admissions for UTI and pneumonia, dysphagia with recent aspiration pneumonia, HTN. She presented from SNF on 11/03/2022 with decreased level of consciousness. Per EMS report, patient seemed to have worsening dysphagia and oral nutrition was being withheld. There also has been concern for dehydration and facility gave her a liter of fluids 6/17. CXR showed improving airspace disease, CT head did not show any acute findings, she was afebrile and saturating 100% on 4L supplemental oxygen. She had just recently discharged from the hospital on 10/30/22 to SNF [facility]). She has had multiple admissions in February, May and June of 2024.    Assessment / Plan / Recommendation  Clinical Impression  Patient is presenting with clinical s/s of dysphagia as per this bedside swallow evaluation. Per her daughers, she was receiving speech therapy for swallow function at SNF and was making progress, but  they fear she has regressed secondary to being NPO since admitted on 11/03/22. Patient appeared asleep when SLP entered room but she was able awaken with mod-max stimulation (loud voice, rubbing on shoulder). SLP performed oral care via toothette swabs and although oral mucosa was dry, no secretions observed. Patient was able to actively accept small ice chips from spoon (one at a time) after which she proceeded to very lightly masticate. She did not start to initiate swallows until after the second ice chip and did require cues to maintain adequate attention. She was then able to sip water from spoon, exhibiting suspected delayed swallow initiation. She did exhibit a delayed cough which is consistent with previous objective swallow studies which showed very delayed swallow initiation and aspiration with thin liquids. SLP is recommending NPO status, allow small amounts ice chips, spoon sips water after oral care when patient is awake and alert. SLP will follow for ability to advance to a PO diet, with prognosis being fair. SLP Visit Diagnosis: Dysphagia, oropharyngeal phase (R13.12)    Aspiration Risk  Moderate aspiration risk;Severe aspiration risk;Risk for inadequate nutrition/hydration    Diet Recommendation NPO   Medication Administration: Via alternative means Compensations: Slow rate;Small sips/bites    Other  Recommendations Oral Care Recommendations: Oral care prior to ice chip/H20;Oral care BID;Staff/trained caregiver to provide oral care Caregiver Recommendations: Have oral suction available    Recommendations for follow up therapy are one component of a multi-disciplinary discharge planning process, led by the attending physician.  Recommendations may be updated based on patient status, additional functional criteria and insurance authorization.  Follow  up Recommendations Skilled nursing-short term rehab (<3 hours/day)      Assistance Recommended at Discharge    Functional Status  Assessment Patient has had a recent decline in their functional status and demonstrates the ability to make significant improvements in function in a reasonable and predictable amount of time.  Frequency and Duration min 2x/week  2 weeks       Prognosis Prognosis for improved oropharyngeal function: Fair Barriers to Reach Goals: Time post onset;Severity of deficits Barriers/Prognosis Comment: Recurrent hospital admissions and severe deconditioning      Swallow Study   General Date of Onset: 11/03/22 HPI: Patient is an 82 y.o. female with PMH: chronic pulmonary blastomycosis status post lobectomy in 2014, chronic hypoxic respiratory failure, recurrent admissions for UTI and pneumonia, dysphagia with recent aspiration pneumonia, HTN. She presented from SNF on 11/03/2022 with decreased level of consciousness. Per EMS report, patient seemed to have worsening dysphagia and oral nutrition was being withheld. There also has been concern for dehydration and facility gave her a liter of fluids 6/17. CXR showed improving airspace disease, CT head did not show any acute findings, she was afebrile and saturating 100% on 4L supplemental oxygen. She had just recently discharged from the hospital on 10/30/22 to SNF [facility]). She has had multiple admissions in February, May and June of 2024. Type of Study: Bedside Swallow Evaluation Previous Swallow Assessment: multiple during previous admissions Diet Prior to this Study: NPO Temperature Spikes Noted: No Respiratory Status: Nasal cannula History of Recent Intubation: No Behavior/Cognition: Cooperative;Pleasant mood;Requires cueing;Lethargic/Drowsy Oral Cavity Assessment: Dry Oral Care Completed by SLP: Yes Oral Cavity - Dentition: Adequate natural dentition Self-Feeding Abilities: Total assist Patient Positioning: Upright in bed Baseline Vocal Quality: Low vocal intensity;Normal Volitional Cough: Weak Volitional Swallow: Unable to elicit     Oral/Motor/Sensory Function Overall Oral Motor/Sensory Function: Generalized oral weakness   Ice Chips Ice chips: Impaired Oral Phase Impairments: Impaired mastication;Reduced labial seal;Reduced lingual movement/coordination Pharyngeal Phase Impairments: Suspected delayed Swallow   Thin Liquid Thin Liquid: Impaired Presentation: Spoon Oral Phase Impairments: Reduced labial seal;Reduced lingual movement/coordination Pharyngeal  Phase Impairments: Suspected delayed Swallow;Cough - Delayed    Nectar Thick Nectar Thick Liquid: Not tested   Honey Thick Honey Thick Liquid: Not tested   Puree Puree: Not tested   Solid     Solid: Not tested      Angela Nevin, MA, CCC-SLP Speech Therapy

## 2022-11-05 DIAGNOSIS — G9340 Encephalopathy, unspecified: Secondary | ICD-10-CM | POA: Diagnosis not present

## 2022-11-05 LAB — CBC
HCT: 34.1 % — ABNORMAL LOW (ref 36.0–46.0)
Hemoglobin: 10.8 g/dL — ABNORMAL LOW (ref 12.0–15.0)
MCH: 28.2 pg (ref 26.0–34.0)
MCHC: 31.7 g/dL (ref 30.0–36.0)
MCV: 89 fL (ref 80.0–100.0)
Platelets: 144 10*3/uL — ABNORMAL LOW (ref 150–400)
RBC: 3.83 MIL/uL — ABNORMAL LOW (ref 3.87–5.11)
RDW: 19 % — ABNORMAL HIGH (ref 11.5–15.5)
WBC: 4.9 10*3/uL (ref 4.0–10.5)
nRBC: 0.4 % — ABNORMAL HIGH (ref 0.0–0.2)

## 2022-11-05 LAB — FOLATE: Folate: 4.5 ng/mL — ABNORMAL LOW (ref >5.9)

## 2022-11-05 LAB — BASIC METABOLIC PANEL
Anion gap: 6 (ref 5–15)
BUN: 6 mg/dL — ABNORMAL LOW (ref 8–23)
CO2: 26 mmol/L (ref 22–32)
Calcium: 8 mg/dL — ABNORMAL LOW (ref 8.9–10.3)
Chloride: 109 mmol/L (ref 98–111)
Creatinine, Ser: 0.57 mg/dL (ref 0.44–1.00)
GFR, Estimated: >60 mL/min (ref >60)
Glucose, Bld: 109 mg/dL — ABNORMAL HIGH (ref 70–99)
Potassium: 3 mmol/L — ABNORMAL LOW (ref 3.5–5.1)
Sodium: 141 mmol/L (ref 135–145)

## 2022-11-05 LAB — MAGNESIUM: Magnesium: 2 mg/dL (ref 1.7–2.4)

## 2022-11-05 MED ORDER — DOCUSATE SODIUM 100 MG PO CAPS
100.0000 mg | ORAL_CAPSULE | Freq: Two times a day (BID) | ORAL | Status: DC
  Filled 2022-11-05 (×3): qty 1

## 2022-11-05 MED ORDER — SODIUM CHLORIDE 0.9 % IV SOLN: 1.0000 mg | Freq: Every day | INTRAVENOUS | Status: DC

## 2022-11-05 MED ORDER — FOLIC ACID 5 MG/ML IJ SOLN
1.0000 mg | Freq: Every day | INTRAMUSCULAR | Status: DC
  Administered 2022-11-05 – 2022-11-10 (×6): 1 mg via INTRAVENOUS
  Filled 2022-11-05 (×7): qty 0.2

## 2022-11-05 MED ORDER — POTASSIUM CHLORIDE 10 MEQ/100ML IV SOLN
10.0000 meq | INTRAVENOUS | Status: AC
  Administered 2022-11-05 (×4): 10 meq via INTRAVENOUS
  Filled 2022-11-05 (×2): qty 100

## 2022-11-05 MED ORDER — DEXTROSE-SODIUM CHLORIDE 5-0.45 % IV SOLN: INTRAVENOUS | Status: DC

## 2022-11-05 MED ORDER — SODIUM CHLORIDE 0.9 % IV SOLN
2.0000 g | INTRAVENOUS | Status: DC
  Administered 2022-11-05 – 2022-11-10 (×6): 2 g via INTRAVENOUS
  Filled 2022-11-05 (×6): qty 20

## 2022-11-05 MED ORDER — POLYETHYLENE GLYCOL 3350 17 G PO PACK
17.0000 g | PACK | Freq: Every day | ORAL | Status: DC
  Administered 2022-11-06: 17 g via ORAL
  Filled 2022-11-05 (×3): qty 1

## 2022-11-05 NOTE — Progress Notes (Signed)
PROGRESS NOTE    Barbara Nelson  ZOX:[id number] DOB: [birth year] DOA: 11/03/2022 PCP: [name], MD   Brief Narrative:  82 year old with history of chronic pulmonary blasto cytosis status post lobectomy 2014, chronic hypoxia, recurrent admissions for UTI, pneumonia, dysphagia with recurrent aspiration, HTN brought to the hospital from skilled nursing facility for altered mental status.  Apparently patient has been having severe dysphagia at the facility with poor oral nutrition.  Now there is concerns of dehydration therefore brought to the hospital.  Upon admission chest x-ray was overall unremarkable, CT of the head was negative.  There was clinical signs of dehydration with hypernatremia.  Patient getting D5 water.  Seen by speech and swallow therapy, high aspiration risk.   Assessment & Plan:  Principal Problem:   Acute encephalopathy Active Problems:   Chronic pulmonary blastomycosis (HCC)   Essential hypertension   Dehydration   FTT (failure to thrive) in adult   Hypernatremia   Hypokalemia   Chronic respiratory failure with hypoxia (HCC)   Dysphagia     Acute metabolic encephalopathy, toxic.  Improved -Mentation is improved but overall tired.  This is likely from dehydration or metabolic derangements.  CT of the head is negative.  Ammonia, TSH, B12 are normal.   Dysphagia  Odynophagia Recently completed 14-day course of Diflucan, now on PPI and Carafate.  Unfortunately has worsening dysphagia as well and speech has recommended NPO.  Have consulted palliative care as we need to decide between comfort feeding versus advance feeding tube  Folic acid deficiency - IV folate   Hypernatremia/hypokalemia Hyponatremia improved with D5 water.  Will transition to D5 half-normal saline.    Chronic hypoxic respiratory failure; recent pneumonia with parapneumonic effusion   Recently discharged on cefdinir for total of 28 days, last day 11/27/2022.  For now due to poor oral  intake, IV Rocephin   Essential hypertension Toprol-XL.  IV as needed   Urinary retention  - In and out cath as needed for now  Severe protein calorie malnutrition Adult failure to thrive Severe dysphagia with recurrent aspirations supportive care  Constipation - Bowel regimen  Family would like to try dysphagia 1 diet.  But also discussed comfort feeding and advancing as tolerated.  DVT prophylaxis: Lovenox Code Status: DNR Family Communication: Daughter is at bedside Continue hospital stay for IV fluids, still severely dehydrated requiring electrolyte management       Diet Orders (From admission, onward)     Start     Ordered   11/05/22 0718  Diet NPO time specified Except for: Ice Chips, Other (See Comments), Sips with Meds  Diet effective now       Comments: Single ice chips/small spoon sips water PRN after oral care when awake and alert  Question Answer Comment  Except for Ice Chips   Except for Other (See Comments)   Except for Sips with Meds      11/05/22 0718            Subjective: Seen and examined at bedside.  Patient is resting comfortably.  No acute complaints.  Very poor oral intake.  I spoke extensively with both the daughters at bedside regarding her poor oral intake and recurrent risk of aspiration.  At this time family would like to hold off on any kind of advanced feeding tube.  They will attempt oral as tolerated.  Examination: Constitutional: Not in acute distress, frail.  Drowsy Respiratory: Clear to auscultation bilaterally Cardiovascular: Normal sinus rhythm, no rubs Abdomen: Nontender nondistended  good bowel sounds Musculoskeletal: No edema noted Skin: No rashes seen Neurologic: CN 2-12 grossly intact.  And nonfocal Psychiatric: Normal judgment and insight. Alert and oriented x 3. Normal mood.   Objective: Vitals:   11/04/22 1705 11/04/22 2125 11/05/22 0424 11/05/22 0500  BP: (!) 150/90 130/76 124/66   Pulse: 78 80 69   Resp:  18 18 18    Temp: (!) 97.2 F (36.2 C) 97.8 F (36.6 C) (!) 97.3 F (36.3 C)   TempSrc: Oral Axillary Oral   SpO2: 95% 97% 100%   Weight:    83.6 kg  Height:        Intake/Output Summary (Last 24 hours) at 11/05/2022 0725 Last data filed at 11/04/2022 1130 Gross per 24 hour  Intake 120 ml  Output --  Net 120 ml   Filed Weights   11/03/22 1902 11/05/22 0500  Weight: 86.2 kg 83.6 kg    Scheduled Meds:  cefdinir  300 mg Oral BID   docusate sodium  100 mg Oral BID   enoxaparin (LOVENOX) injection  40 mg Subcutaneous Q24H   Gerhardt's butt cream   Topical BID   ketotifen  1 drop Both Eyes BID   metoprolol succinate  12.5 mg Oral QPM   pantoprazole (PROTONIX) IV  40 mg Intravenous Q24H   polyethylene glycol  17 g Oral Daily   sucralfate  1 g Oral TID WC & HS   Continuous Infusions:  dextrose 75 mL/hr at 11/05/22 0640    Nutritional status     Body mass index is 29.75 kg/m.  Data Reviewed:   CBC: Recent Labs  Lab 11/03/22 2020 11/04/22 0813  WBC 6.3 6.0  NEUTROABS 4.0  --   HGB 11.0* 10.5*  HCT 34.5* 33.2*  MCV 87.8 91.5  PLT 174 151   Basic Metabolic Panel: Recent Labs  Lab 11/03/22 2020 11/04/22 0813 11/04/22 0915  NA 150* 149*  --   K 2.6* 3.0*  --   CL 115* 116*  --   CO2 31 28  --   GLUCOSE 95 127*  --   BUN 10 8  --   CREATININE 0.69 0.61  --   CALCIUM 8.8* 8.3*  --   MG 2.4 2.0  --   PHOS  --  1.9* 1.9*   GFR: Estimated Creatinine Clearance: 59.1 mL/min (by C-G formula based on SCr of 0.61 mg/dL). Liver Function Tests: Recent Labs  Lab 11/03/22 2020  AST 44*  ALT 25  ALKPHOS 123  BILITOT 0.7  PROT 6.3*  ALBUMIN 1.7*   No results for input(s): "LIPASE", "AMYLASE" in the last 168 hours. Recent Labs  Lab 11/03/22 2020  AMMONIA 14   Coagulation Profile: No results for input(s): "INR", "PROTIME" in the last 168 hours. Cardiac Enzymes: No results for input(s): "CKTOTAL", "CKMB", "CKMBINDEX", "TROPONINI" in the last 168  hours. BNP (last 3 results) No results for input(s): "PROBNP" in the last 8760 hours. HbA1C: No results for input(s): "HGBA1C" in the last 72 hours. CBG: No results for input(s): "GLUCAP" in the last 168 hours. Lipid Profile: No results for input(s): "CHOL", "HDL", "LDLCALC", "TRIG", "CHOLHDL", "LDLDIRECT" in the last 72 hours. Thyroid Function Tests: Recent Labs    11/04/22 0915  TSH 1.951   Anemia Panel: Recent Labs    11/04/22 0915  VITAMINB12 860   Sepsis Labs: Recent Labs  Lab 11/03/22 2020  LATICACIDVEN 1.0    No results found for this or any previous visit (from the past 240  hour(s)).       Radiology Studies: DG Chest Port 1 View  Result Date: 11/03/2022 CLINICAL DATA:  AMS EXAM: PORTABLE CHEST - 1 VIEW COMPARISON:  10/20/2022 FINDINGS: For significant improvement in right upper lung airspace disease. Slight decrease in left retrocardiac consolidation/atelectasis. Small residual pleural effusions. Stable mild cardiomegaly. No pneumothorax. Visualized bones unremarkable. Residual contrast in the splenic flexure of the colon. IMPRESSION: Improving right upper lung and left retrocardiac airspace disease. Electronically Signed   By: Corlis Leak M.D.   On: 11/03/2022 20:12   CT HEAD WO CONTRAST  Result Date: 11/03/2022 CLINICAL DATA:  Mental status change EXAM: CT HEAD WITHOUT CONTRAST TECHNIQUE: Contiguous axial images were obtained from the base of the skull through the vertex without intravenous contrast. RADIATION DOSE REDUCTION: This exam was performed according to the departmental dose-optimization program which includes automated exposure control, adjustment of the mA and/or kV according to patient size and/or use of iterative reconstruction technique. COMPARISON:  Head CT 10/20/2022 FINDINGS: Brain: No evidence of acute infarction, hemorrhage, hydrocephalus, extra-axial collection or mass lesion/mass effect. There is mild diffuse atrophy and mild periventricular  white matter hypodensity, unchanged from prior. Vascular: No hyperdense vessel or unexpected calcification. Skull: Normal. Negative for fracture or focal lesion. Sinuses/Orbits: No acute finding. Other: None. IMPRESSION: 1. No acute intracranial abnormality. 2. Mild diffuse atrophy and mild periventricular white matter hypodensity, unchanged from prior. Electronically Signed   By: Darliss Cheney M.D.   On: 11/03/2022 20:01           LOS: 2 days   Time spent= 35 mins    Takiera Mayo Joline Maxcy, MD [facility]  If 7PM-7AM, please contact night-coverage  11/05/2022, 7:25 AM

## 2022-11-05 NOTE — Progress Notes (Signed)
SLP Cancellation Note  Patient Details Name: TYRIELLE VALLAS MRN: [id number] DOB: 08-Dec-1940   Cancelled treatment:       Reason Eval/Treat Not Completed: Other (comment) (pt getting ready to get transferred back to bed by mobility team; will continue efforts; note pt on diet at this time)  [name], MS [facility] SLP Acute Rehab Services Office [phone]   [name] 11/05/2022, 11:45 AM

## 2022-11-05 NOTE — Progress Notes (Signed)
   11/04/22 2240  Provider Notification  Provider Name/Title Dr. Virgel Manifold  Date Provider Notified 11/04/22  Time Provider Notified 2235  Method of Notification  (secure chat)  Notification Reason Other (Comment) (FYI: Pt admitted for acute encephalopathy. PT has been made NPO due to severe dysphagia, while they explore options with family. Pt has PO antibiotic ordered ordered from prior hospitalization for pneumonia. All PO meds held at this time.)  Provider response No new orders  Date of Provider Response 11/04/22  Time of Provider Response 2240

## 2022-11-05 NOTE — Progress Notes (Signed)
   11/05/22 0981  Provider Notification  Provider Name/Title Dr. Nelson Chimes  Date Provider Notified 11/05/22  Time Provider Notified [phone]  Method of Notification  (secure chat)  Notification Reason Other (Comment) (Followup on antibiotic therapy and constipation.)  Provider response Other (Comment) (awaiting response)   [name] daughter concerned about pt not receiving her antibiotics due to her NPO status and would like to discuss the plan to continue her antibiotic treatment. Also she would like something ordered for constipation if possible, per her report, pt has not had a bowel movement since last Sunday. Bowel sounds hypoactive, abdomen soft.

## 2022-11-05 NOTE — Progress Notes (Signed)
Mobility Specialist - Progress Note   11/05/22 1015  Mobility  Activity Transferred from bed to chair  Level of Assistance Total care  Assistive Device MaxiMove  Distance Ambulated (ft) 0 ft  Activity Response Tolerated well  Mobility Referral Yes  $Mobility charge 1 Mobility  Mobility Specialist Start Time (ACUTE ONLY) 0955  Mobility Specialist Stop Time (ACUTE ONLY) 1010  Mobility Specialist Time Calculation (min) (ACUTE ONLY) 15 min   Pt received in bed and agreed to mobility. Had no issues throughout session, lifted to chair with all needs met, family in room.  Marilynne Halsted Mobility Specialist

## 2022-11-05 NOTE — Progress Notes (Signed)
Mobility Specialist - Progress Note   11/05/22 1200  Mobility  Activity Transferred from chair to bed  Level of Assistance Total care  Assistive Device MaxiMove  Distance Ambulated (ft) 0 ft  Activity Response Tolerated well  Mobility Referral Yes  $Mobility charge 1 Mobility  Mobility Specialist Start Time (ACUTE ONLY) 1140  Mobility Specialist Stop Time (ACUTE ONLY) 1200  Mobility Specialist Time Calculation (min) (ACUTE ONLY) 20 min   Pt received in chair and family requested transfer.   Pt in bed with all needs met.  Marilynne Halsted Mobility Specialist

## 2022-11-06 ENCOUNTER — Encounter: Payer: Self-pay | Admitting: Family Medicine

## 2022-11-06 DIAGNOSIS — G9340 Encephalopathy, unspecified: Secondary | ICD-10-CM | POA: Diagnosis not present

## 2022-11-06 LAB — BASIC METABOLIC PANEL
Anion gap: 5 (ref 5–15)
BUN: 7 mg/dL — ABNORMAL LOW (ref 8–23)
CO2: 25 mmol/L (ref 22–32)
Calcium: 7.7 mg/dL — ABNORMAL LOW (ref 8.9–10.3)
Chloride: 107 mmol/L (ref 98–111)
Creatinine, Ser: 0.64 mg/dL (ref 0.44–1.00)
GFR, Estimated: >60 mL/min (ref >60)
Glucose, Bld: 99 mg/dL (ref 70–99)
Potassium: 3.3 mmol/L — ABNORMAL LOW (ref 3.5–5.1)
Sodium: 137 mmol/L (ref 135–145)

## 2022-11-06 LAB — GLUCOSE, CAPILLARY
Glucose-Capillary: 107 mg/dL — ABNORMAL HIGH (ref 70–99)
Glucose-Capillary: 129 mg/dL — ABNORMAL HIGH (ref 70–99)
Glucose-Capillary: 108 mg/dL — ABNORMAL HIGH (ref 70–99)

## 2022-11-06 LAB — CBC
HCT: 36.7 % (ref 36.0–46.0)
Hemoglobin: 12 g/dL (ref 12.0–15.0)
MCH: 28.5 pg (ref 26.0–34.0)
MCHC: 32.7 g/dL (ref 30.0–36.0)
MCV: 87.2 fL (ref 80.0–100.0)
Platelets: 122 10*3/uL — ABNORMAL LOW (ref 150–400)
RBC: 4.21 MIL/uL (ref 3.87–5.11)
RDW: 18.9 % — ABNORMAL HIGH (ref 11.5–15.5)
WBC: 4.9 10*3/uL (ref 4.0–10.5)
nRBC: 0.4 % — ABNORMAL HIGH (ref 0.0–0.2)

## 2022-11-06 LAB — PHOSPHORUS: Phosphorus: 2.1 mg/dL — ABNORMAL LOW (ref 2.5–4.6)

## 2022-11-06 LAB — MAGNESIUM: Magnesium: 1.8 mg/dL (ref 1.7–2.4)

## 2022-11-06 MED ORDER — SODIUM CHLORIDE 0.9 % IV SOLN: INTRAVENOUS | Status: DC

## 2022-11-06 MED ORDER — POTASSIUM CHLORIDE 10 MEQ/100ML IV SOLN
10.0000 meq | INTRAVENOUS | Status: AC
  Administered 2022-11-06 (×2): 10 meq via INTRAVENOUS
  Filled 2022-11-06 (×2): qty 100

## 2022-11-06 MED ORDER — POTASSIUM PHOSPHATES 15 MMOLE/5ML IV SOLN
30.0000 mmol | Freq: Once | INTRAVENOUS | Status: AC
  Administered 2022-11-06: 30 mmol via INTRAVENOUS
  Filled 2022-11-06: qty 10

## 2022-11-06 MED ORDER — POTASSIUM CHLORIDE 20 MEQ PO PACK
40.0000 meq | PACK | Freq: Once | ORAL | Status: AC
  Administered 2022-11-06: 40 meq via ORAL
  Filled 2022-11-06: qty 2

## 2022-11-06 MED ORDER — LACTULOSE 10 GM/15ML PO SOLN
20.0000 g | ORAL | Status: AC
  Administered 2022-11-06: 20 g via ORAL
  Filled 2022-11-06 (×2): qty 30

## 2022-11-06 NOTE — Progress Notes (Signed)
PROGRESS NOTE    Barbara Nelson  UEA:[id number] DOB: March 28, 1941 DOA: 11/03/2022 PCP: [name], MD   Brief Narrative:  82 year old with history of chronic pulmonary blasto cytosis status post lobectomy 2014, chronic hypoxia, recurrent admissions for UTI, pneumonia, dysphagia with recurrent aspiration, HTN brought to the hospital from skilled nursing facility for altered mental status.  Apparently patient has been having severe dysphagia at the facility with poor oral nutrition.  Now there is concerns of dehydration therefore brought to the hospital.  Upon admission chest x-ray was overall unremarkable, CT of the head was negative.  There was clinical signs of dehydration with hypernatremia.  Patient getting D5 water.  Seen by speech and swallow therapy, high aspiration risk.   Assessment & Plan:  Principal Problem:   Acute encephalopathy Active Problems:   Chronic pulmonary blastomycosis (HCC)   Essential hypertension   Dehydration   FTT (failure to thrive) in adult   Hypernatremia   Hypokalemia   Chronic respiratory failure with hypoxia (HCC)   Dysphagia     Acute metabolic encephalopathy, toxic.  Improved -Mentation has improved, metabolic workup remains negative.  CT head, ammonia, TSH, B12 are all normal.   Dysphagia  Odynophagia Recently completed 14-day course of Diflucan, now on PPI and Carafate.  At this time family does not want any advanced feeding tube.  Oral diet as tolerated.  Folic acid deficiency - IV folate   Hypernatremia/hypokalemia Resolved.  Will continue normal saline for next 24 hours while she is here    Chronic hypoxic respiratory failure; recent pneumonia with parapneumonic effusion   Recently discharged on cefdinir for total of 28 days, last day 11/27/2022.  For now due to poor oral intake, IV Rocephin   Essential hypertension Toprol-XL.  IV as needed   Urinary retention  - In and out cath as needed for now  Severe protein calorie  malnutrition Adult failure to thrive Severe dysphagia with recurrent aspirations supportive care  Constipation - Bowel regimen  Family would like to try dysphagia 1 diet.  But also discussed comfort feeding and advancing as tolerated. Unfortunately given her recurrent risk of aspiration, poor oral tolerance she remains high risk for readmission due to dehydration.  She is also had a significant aspiration risk.  I have offered her in-house palliative care but patient and family would like to follow-up at their previously known palliative care team at the facility.  DVT prophylaxis: Lovenox Code Status: DNR Family Communication: Daughter is at bedside Patient still remains weak with poor oral intake.  Will consult TOC for SNF placement      Diet Orders (From admission, onward)     Start     Ordered   11/05/22 0935  DIET - DYS 1 Room service appropriate? Yes with Assist; Fluid consistency: Nectar Thick  Diet effective now       Question Answer Comment  Room service appropriate? Yes with Assist   Fluid consistency: Nectar Thick      11/05/22 0934            Subjective: Overall patient still remains very weak with significantly poor oral intake.  Examination: Constitutional: Not in acute distress, generally weak and quite frail Respiratory: Clear to auscultation bilaterally Cardiovascular: Normal sinus rhythm, no rubs Abdomen: Nontender nondistended good bowel sounds Musculoskeletal: No edema noted Skin: No rashes seen Neurologic: CN 2-12 grossly intact.  And nonfocal Psychiatric: Normal judgment and insight. Alert and oriented x 3. Normal mood.  Objective: Vitals:   11/05/22 1357  11/05/22 2028 11/05/22 2033 11/06/22 0658  BP: 126/83  120/72 (!) 108/95  Pulse: 71 76 76 77  Resp: 16  18 18   Temp: (!) 97.4 F (36.3 C)  (!) 97.4 F (36.3 C) 98 F (36.7 C)  TempSrc:   Oral   SpO2: 96% 99% 99% 100%  Weight:      Height:        Intake/Output Summary (Last 24  hours) at 11/06/2022 1343 Last data filed at 11/05/2022 1500 Gross per 24 hour  Intake 1543.16 ml  Output --  Net 1543.16 ml   Filed Weights   11/03/22 1902 11/05/22 0500  Weight: 86.2 kg 83.6 kg    Scheduled Meds:  docusate sodium  100 mg Oral BID   enoxaparin (LOVENOX) injection  40 mg Subcutaneous Q24H   folic acid  1 mg Intravenous Daily   Gerhardt's butt cream   Topical BID   ketotifen  1 drop Both Eyes BID   metoprolol succinate  12.5 mg Oral QPM   pantoprazole (PROTONIX) IV  40 mg Intravenous Q24H   polyethylene glycol  17 g Oral Daily   sucralfate  1 g Oral TID WC & HS   Continuous Infusions:  cefTRIAXone (ROCEPHIN)  IV Stopped (11/05/22 1430)   dextrose 5 % and 0.45 % NaCl 75 mL/hr at 11/05/22 1208    Nutritional status     Body mass index is 29.75 kg/m.  Data Reviewed:   CBC: Recent Labs  Lab 11/03/22 2020 11/04/22 0813 11/05/22 0824 11/06/22 0840  WBC 6.3 6.0 4.9 4.9  NEUTROABS 4.0  --   --   --   HGB 11.0* 10.5* 10.8* 12.0  HCT 34.5* 33.2* 34.1* 36.7  MCV 87.8 91.5 89.0 87.2  PLT 174 151 144* 122*   Basic Metabolic Panel: Recent Labs  Lab 11/03/22 2020 11/04/22 0813 11/04/22 0915 11/05/22 0824 11/06/22 0840  NA 150* 149*  --  141 137  K 2.6* 3.0*  --  3.0* 3.3*  CL 115* 116*  --  109 107  CO2 31 28  --  26 25  GLUCOSE 95 127*  --  109* 99  BUN 10 8  --  6* 7*  CREATININE 0.69 0.61  --  0.57 0.64  CALCIUM 8.8* 8.3*  --  8.0* 7.7*  MG 2.4 2.0  --  2.0 1.8  PHOS  --  1.9* 1.9*  --  2.1*   GFR: Estimated Creatinine Clearance: 59.1 mL/min (by C-G formula based on SCr of 0.64 mg/dL). Liver Function Tests: Recent Labs  Lab 11/03/22 2020  AST 44*  ALT 25  ALKPHOS 123  BILITOT 0.7  PROT 6.3*  ALBUMIN 1.7*   No results for input(s): "LIPASE", "AMYLASE" in the last 168 hours. Recent Labs  Lab 11/03/22 2020  AMMONIA 14   Coagulation Profile: No results for input(s): "INR", "PROTIME" in the last 168 hours. Cardiac Enzymes: No  results for input(s): "CKTOTAL", "CKMB", "CKMBINDEX", "TROPONINI" in the last 168 hours. BNP (last 3 results) No results for input(s): "PROBNP" in the last 8760 hours. HbA1C: No results for input(s): "HGBA1C" in the last 72 hours. CBG: Recent Labs  Lab 11/06/22 0804 11/06/22 1221  GLUCAP 107* 129*   Lipid Profile: No results for input(s): "CHOL", "HDL", "LDLCALC", "TRIG", "CHOLHDL", "LDLDIRECT" in the last 72 hours. Thyroid Function Tests: Recent Labs    11/04/22 0915  TSH 1.951   Anemia Panel: Recent Labs    11/04/22 0915 11/05/22 0824  VITAMINB12 860  --  FOLATE  --  4.5*   Sepsis Labs: Recent Labs  Lab 11/03/22 2020  LATICACIDVEN 1.0    No results found for this or any previous visit (from the past 240 hour(s)).       Radiology Studies: No results found.         LOS: 3 days   Time spent= 35 mins    Lakeria Starkman Joline Maxcy, MD [facility]  If 7PM-7AM, please contact night-coverage  11/06/2022, 1:43 PM

## 2022-11-06 NOTE — Progress Notes (Signed)
SLP Cancellation Note  Patient Details Name: Barbara Nelson MRN: [id number] DOB: 1940-07-02   Cancelled treatment:       Reason Eval/Treat Not Completed: Other (comment) (Following discussion with Dr Nelson Chimes, SLP signed off.   Of note, pt was on dys1/nectar and consuming thin liquids between meals prior to recent hospital discharge.  Her dysphagia prohibits her from obtaining adequate hydration and nutrition.)  [name], MS [facility] SLP Acute Rehab Services Office [phone]   [name] 11/06/2022, 12:00 PM

## 2022-11-06 NOTE — Progress Notes (Signed)
Mobility Specialist - Progress Note   11/06/22 1030  Mobility  Activity Transferred from bed to chair  Level of Assistance Total care  Assistive Device MaxiMove  Activity Response Tolerated fair  Mobility Referral Yes (lift)  $Mobility charge 1 Mobility  Mobility Specialist Start Time (ACUTE ONLY) 1030  Mobility Specialist Stop Time (ACUTE ONLY) 1045  Mobility Specialist Time Calculation (min) (ACUTE ONLY) 15 min   Pt received in bed and family requested transfer to chair, no issues throughout session, returned to chair with all needs met.  Marilynne Halsted Mobility Specialist

## 2022-11-06 NOTE — Progress Notes (Signed)
Physical Therapy Treatment Patient Details Name: Barbara Nelson MRN: [id number] DOB: [birth year] Today's Date: 11/06/2022   History of Present Illness Patient is a 82 year old female who presented to ED on 6/18/204  from SNF due to dehydration and acute encephalpathy. pt was recemty d/c from [facility] on 6/14 following hospitalization  due to decreased urinary output, difficulty with mobility and swallowing. Patient was admitted with aspiration pneumonia, hyponatremia, PMH: HTN, recent UTI.    PT Comments     Pt admitted with above diagnosis. Pt currently with functional limitations due to the deficits listed below (see PT Problem List). Pt seated in recliner with anterior positioning and L rotation with B LE on floor when PT arrived. Family indicated that pt had been seated in recliner for 2 hrs and was ready to return to bed. Pt EO and minimal verbal engagement and indicated she would like to return to bed. PT required use of total lift for pt and staff safety. Once returned to bed pt required max A to roll side to side with minimal initiation of movement to assist . Pt propped with pillows for comfort and positioning. Nursing made aware of evidence of R IV site bleeding and to attend. Pt left in bed, all needs in place and family present. Pt will benefit from acute skilled PT to increase their independence and safety with mobility to allow discharge.     Recommendations for follow up therapy are one component of a multi-disciplinary discharge planning process, led by the attending physician.  Recommendations may be updated based on patient status, additional functional criteria and insurance authorization.  Follow Up Recommendations  Can patient physically be transported by private vehicle: No    Assistance Recommended at Discharge Frequent or constant Supervision/Assistance  Patient can return home with the following Two people to help with walking and/or transfers;Two people to help with  bathing/dressing/bathroom;Direct supervision/assist for medications management;Help with stairs or ramp for entrance;Assistance with cooking/housework;Assist for transportation;Assistance with feeding;Direct supervision/assist for financial management   Equipment Recommendations  None recommended by PT    Recommendations for Other Services       Precautions / Restrictions Precautions Precautions: Fall Precaution Comments: aspiration Restrictions Weight Bearing Restrictions: No     Mobility  Bed Mobility Overal bed mobility: Needs Assistance Bed Mobility: Rolling Rolling: Max assist, +2 for physical assistance         General bed mobility comments: limited to no inititation of movement to assist    Transfers                   General transfer comment: pt seated in recliner,  PT assisted pt with total lift to return to bed    Ambulation/Gait               General Gait Details: pt is non amb   Optometrist    Modified Rankin (Stroke Patients Only)       Balance Overall balance assessment: Needs assistance Sitting-balance support: Feet supported, Bilateral upper extremity supported Sitting balance-Leahy Scale: Poor Sitting balance - Comments: less than 5% effort from patient during session Postural control: Posterior lean, Left lateral lean                                  Cognition Arousal/Alertness: Awake/alert (pt EO and occational verbal response) Behavior During  Therapy: Flat affect Overall Cognitive Status: Difficult to assess Area of Impairment: Attention, Following commands                 Orientation Level: Disoriented to, Place, Time, Situation     Following Commands: Follows one step commands inconsistently     Problem Solving: Slow processing, Requires verbal cues, Requires tactile cues, Difficulty sequencing, Decreased initiation          Exercises      General  Comments        Pertinent Vitals/Pain Pain Assessment Pain Assessment: Faces Faces Pain Scale: Hurts even more Breathing: normal Negative Vocalization: none Facial Expression: smiling or inexpressive Body Language: relaxed Consolability: no need to console PAINAD Score: 0 Pain Location: legs with movement and B UE pt is unable to communicate pain location however demonstrates pain behaviors Pain Descriptors / Indicators: Grimacing, Moaning Pain Intervention(s): Limited activity within patient's tolerance, Monitored during session, Repositioned    Home Living                          Prior Function            PT Goals (current goals can now be found in the care plan section) Acute Rehab PT Goals PT Goal Formulation: Patient unable to participate in goal setting Time For Goal Achievement: 11/18/22 Potential to Achieve Goals: Fair Progress towards PT goals: Progressing toward goals    Frequency    Min 1X/week      PT Plan Current plan remains appropriate    Co-evaluation              AM-PAC PT "6 Clicks" Mobility   Outcome Measure  Help needed turning from your back to your side while in a flat bed without using bedrails?: Total Help needed moving from lying on your back to sitting on the side of a flat bed without using bedrails?: Total Help needed moving to and from a bed to a chair (including a wheelchair)?: Total Help needed standing up from a chair using your arms (e.g., wheelchair or bedside chair)?: Total Help needed to walk in hospital room?: Total Help needed climbing 3-5 steps with a railing? : Total 6 Click Score: 6    End of Session Equipment Utilized During Treatment: Oxygen Activity Tolerance: Patient limited by lethargy;Patient limited by pain Patient left: in bed;with bed alarm set;with family/visitor present Nurse Communication: Mobility status;Need for lift equipment PT Visit Diagnosis: Other abnormalities of gait and mobility  (R26.89);Muscle weakness (generalized) (M62.81)     Time: 0454-0981 PT Time Calculation (min) (ACUTE ONLY): 18 min  Charges:  $Therapeutic Activity: 8-22 mins                     Johnny Bridge, PT Acute Rehab   Jacqualyn Posey 11/06/2022, 1:14 PM

## 2022-11-06 NOTE — Progress Notes (Signed)
[facility]) Hospital Liaison Note  This patient is a current [facility] patient receiving outpatient palliative care services.  [facility] liaison will follow for discharge disposition.  Doreatha Martin, RN, BSN [facility] Liaison [phone]

## 2022-11-06 NOTE — Plan of Care (Signed)

## 2022-11-07 DIAGNOSIS — G9340 Encephalopathy, unspecified: Secondary | ICD-10-CM | POA: Diagnosis not present

## 2022-11-07 LAB — BASIC METABOLIC PANEL
Anion gap: 7 (ref 5–15)
BUN: 6 mg/dL — ABNORMAL LOW (ref 8–23)
CO2: 22 mmol/L (ref 22–32)
Calcium: 7.5 mg/dL — ABNORMAL LOW (ref 8.9–10.3)
Chloride: 107 mmol/L (ref 98–111)
Creatinine, Ser: 0.6 mg/dL (ref 0.44–1.00)
GFR, Estimated: >60 mL/min (ref >60)
Glucose, Bld: 83 mg/dL (ref 70–99)
Potassium: 3.8 mmol/L (ref 3.5–5.1)
Sodium: 136 mmol/L (ref 135–145)

## 2022-11-07 LAB — CBC
HCT: 37 % (ref 36.0–46.0)
Hemoglobin: 12.2 g/dL (ref 12.0–15.0)
MCH: 28 pg (ref 26.0–34.0)
MCHC: 33 g/dL (ref 30.0–36.0)
MCV: 84.9 fL (ref 80.0–100.0)
Platelets: 113 10*3/uL — ABNORMAL LOW (ref 150–400)
RBC: 4.36 MIL/uL (ref 3.87–5.11)
RDW: 19 % — ABNORMAL HIGH (ref 11.5–15.5)
WBC: 4.3 10*3/uL (ref 4.0–10.5)
nRBC: 0.5 % — ABNORMAL HIGH (ref 0.0–0.2)

## 2022-11-07 LAB — MAGNESIUM: Magnesium: 1.7 mg/dL (ref 1.7–2.4)

## 2022-11-07 LAB — GLUCOSE, CAPILLARY
Glucose-Capillary: 66 mg/dL — ABNORMAL LOW (ref 70–99)
Glucose-Capillary: 78 mg/dL (ref 70–99)
Glucose-Capillary: 68 mg/dL — ABNORMAL LOW (ref 70–99)

## 2022-11-07 LAB — PHOSPHORUS: Phosphorus: 3.8 mg/dL (ref 2.5–4.6)

## 2022-11-07 MED ORDER — DEXTROSE-SODIUM CHLORIDE 5-0.45 % IV SOLN: INTRAVENOUS | Status: DC

## 2022-11-07 MED ORDER — SODIUM CHLORIDE 0.9 % IV SOLN: INTRAVENOUS | Status: DC

## 2022-11-07 MED ORDER — DEXTROSE 50 % IV SOLN
1.0000 | INTRAVENOUS | Status: DC | PRN
  Administered 2022-11-07: 50 mL via INTRAVENOUS
  Filled 2022-11-07: qty 50

## 2022-11-07 NOTE — Progress Notes (Signed)
PROGRESS NOTE    [place] COH[place]  ONG:[id number] DOB: 01-Oct-1940 DOA: 11/03/2022 PCP: [name], MD   Brief Narrative:  82 year old with history of chronic pulmonary blasto cytosis status post lobectomy 2014, chronic hypoxia, recurrent admissions for UTI, pneumonia, dysphagia with recurrent aspiration, HTN brought to the hospital from skilled nursing facility for altered mental status.  Apparently patient has been having severe dysphagia at the facility with poor oral nutrition.  Now there is concerns of dehydration therefore brought to the hospital.  Upon admission chest x-ray was overall unremarkable, CT of the head was negative.  There was clinical signs of dehydration with hypernatremia.  Patient getting D5 water.  Seen by speech and swallow therapy, high aspiration risk.  Eventually family was leaning towards taking patient home.   Assessment & Plan:  Principal Problem:   Acute encephalopathy Active Problems:   Chronic pulmonary blastomycosis (HCC)   Essential hypertension   Dehydration   FTT (failure to thrive) in adult   Hypernatremia   Hypokalemia   Chronic respiratory failure with hypoxia (HCC)   Dysphagia     Acute metabolic encephalopathy, toxic.  Improved -Mentation is stable at this time, metabolic workup negative.  CT head, ammonia, TSH, B12 are all normal.   Dysphagia  Odynophagia Recently completed 14-day course of Diflucan, now on PPI and Carafate.  At this time family does not want any advanced feeding tube.  Oral diet as tolerated.  Unfortunately she remains at risk of aspiration and chronic in nature.  Feeding continues to be an issue with very poor oral intake.  Folic acid deficiency - IV folate   Hypernatremia/hypokalemia Resolved.     Chronic hypoxic respiratory failure; recent pneumonia with parapneumonic effusion   Recently discharged on cefdinir for total of 28 days, last day 11/27/2022.  For now due to poor oral intake, IV Rocephin    Essential hypertension Toprol-XL.  IV as needed   Urinary retention  - In and out cath as needed for now  Severe protein calorie malnutrition Adult failure to thrive Severe dysphagia with recurrent aspirations supportive care  Constipation - Bowel regimen  Family understands that she is at her recurrent high risk for aspiration in the meantime she continues to decline due to poor oral intake.  Electrolytes and lab work is improved with IV fluids but fever still remains at this will be recurrent issue after discharge.  Family at this time is leaning towards taking patient home with possible hospice.  TOC has been consulted.  DVT prophylaxis: Lovenox Code Status: DNR Family Communication: Daughter is at bedside [facility] working with family for safe disposition   Diet Orders (From admission, onward)     Start     Ordered   11/05/22 0935  DIET - DYS 1 Room service appropriate? Yes with Assist; Fluid consistency: Nectar Thick  Diet effective now       Question Answer Comment  Room service appropriate? Yes with Assist   Fluid consistency: Nectar Thick      11/05/22 0934            Subjective: Still with poor oral intake. Family requesting to speak with [facility] for options about transporting patient to [facility] region with family  Examination: Constitutional: Not in acute distress, remains really quite ill and frail. Respiratory: Clear to auscultation bilaterally Cardiovascular: Normal sinus rhythm, no rubs Abdomen: Nontender nondistended good bowel sounds Musculoskeletal: No edema noted Skin: No rashes seen Neurologic: CN 2-12 grossly intact.  And nonfocal Psychiatric: Normal  judgment and insight. Alert and oriented x 3. Normal mood.  Objective: Vitals:   11/06/22 1352 11/06/22 1837 11/06/22 2100 11/07/22 0526  BP: 111/68 (!) 130/96 117/75 104/71  Pulse: 72 84 81 85  Resp:   18 18  Temp: 98 F (36.7 C)  (!) 97.4 F (36.3 C) 97.6 F (36.4 C)  TempSrc:    Oral   SpO2: 100%  100% 99%  Weight:      Height:        Intake/Output Summary (Last 24 hours) at 11/07/2022 1044 Last data filed at 11/07/2022 0345 Gross per 24 hour  Intake 1084.63 ml  Output --  Net 1084.63 ml   Filed Weights   11/03/22 1902 11/05/22 0500  Weight: 86.2 kg 83.6 kg    Scheduled Meds:  docusate sodium  100 mg Oral BID   enoxaparin (LOVENOX) injection  40 mg Subcutaneous Q24H   folic acid  1 mg Intravenous Daily   Gerhardt's butt cream   Topical BID   ketotifen  1 drop Both Eyes BID   metoprolol succinate  12.5 mg Oral QPM   pantoprazole (PROTONIX) IV  40 mg Intravenous Q24H   polyethylene glycol  17 g Oral Daily   sucralfate  1 g Oral TID WC & HS   Continuous Infusions:  sodium chloride 75 mL/hr at 11/07/22 0345   cefTRIAXone (ROCEPHIN)  IV Stopped (11/06/22 1519)    Nutritional status     Body mass index is 29.75 kg/m.  Data Reviewed:   CBC: Recent Labs  Lab 11/03/22 2020 11/04/22 0813 11/05/22 0824 11/06/22 0840 11/07/22 0831  WBC 6.3 6.0 4.9 4.9 4.3  NEUTROABS 4.0  --   --   --   --   HGB 11.0* 10.5* 10.8* 12.0 12.2  HCT 34.5* 33.2* 34.1* 36.7 37.0  MCV 87.8 91.5 89.0 87.2 84.9  PLT 174 151 144* 122* 113*   Basic Metabolic Panel: Recent Labs  Lab 11/03/22 2020 11/04/22 0813 11/04/22 0915 11/05/22 0824 11/06/22 0840 11/07/22 0831  NA 150* 149*  --  141 137 136  K 2.6* 3.0*  --  3.0* 3.3* 3.8  CL 115* 116*  --  109 107 107  CO2 31 28  --  26 25 22   GLUCOSE 95 127*  --  109* 99 83  BUN 10 8  --  6* 7* 6*  CREATININE 0.69 0.61  --  0.57 0.64 0.60  CALCIUM 8.8* 8.3*  --  8.0* 7.7* 7.5*  MG 2.4 2.0  --  2.0 1.8 1.7  PHOS  --  1.9* 1.9*  --  2.1* 3.8   GFR: Estimated Creatinine Clearance: 59.1 mL/min (by C-G formula based on SCr of 0.6 mg/dL). Liver Function Tests: Recent Labs  Lab 11/03/22 2020  AST 44*  ALT 25  ALKPHOS 123  BILITOT 0.7  PROT 6.3*  ALBUMIN 1.7*   No results for input(s): "LIPASE", "AMYLASE" in the last  168 hours. Recent Labs  Lab 11/03/22 2020  AMMONIA 14   Coagulation Profile: No results for input(s): "INR", "PROTIME" in the last 168 hours. Cardiac Enzymes: No results for input(s): "CKTOTAL", "CKMB", "CKMBINDEX", "TROPONINI" in the last 168 hours. BNP (last 3 results) No results for input(s): "PROBNP" in the last 8760 hours. HbA1C: No results for input(s): "HGBA1C" in the last 72 hours. CBG: Recent Labs  Lab 11/06/22 0804 11/06/22 1221 11/06/22 1647 11/07/22 0906  GLUCAP 107* 129* 108* 66*   Lipid Profile: No results for input(s): "CHOL", "HDL", "  LDLCALC", "TRIG", "CHOLHDL", "LDLDIRECT" in the last 72 hours. Thyroid Function Tests: No results for input(s): "TSH", "T4TOTAL", "FREET4", "T3FREE", "THYROIDAB" in the last 72 hours.  Anemia Panel: Recent Labs    11/05/22 0824  FOLATE 4.5*   Sepsis Labs: Recent Labs  Lab 11/03/22 2020  LATICACIDVEN 1.0    No results found for this or any previous visit (from the past 240 hour(s)).       Radiology Studies: No results found.         LOS: 4 days   Time spent= 35 mins    Eugene Zeiders Joline Maxcy, MD [facility]  If 7PM-7AM, please contact night-coverage  11/07/2022, 10:44 AM

## 2022-11-08 DIAGNOSIS — G9340 Encephalopathy, unspecified: Secondary | ICD-10-CM | POA: Diagnosis not present

## 2022-11-08 LAB — GLUCOSE, CAPILLARY
Glucose-Capillary: 76 mg/dL (ref 70–99)
Glucose-Capillary: 91 mg/dL (ref 70–99)
Glucose-Capillary: 105 mg/dL — ABNORMAL HIGH (ref 70–99)

## 2022-11-08 LAB — CBC
HCT: 37.2 % (ref 36.0–46.0)
Hemoglobin: 11.8 g/dL — ABNORMAL LOW (ref 12.0–15.0)
MCH: 28 pg (ref 26.0–34.0)
MCHC: 31.7 g/dL (ref 30.0–36.0)
MCV: 88.4 fL (ref 80.0–100.0)
Platelets: 99 10*3/uL — ABNORMAL LOW (ref 150–400)
RBC: 4.21 MIL/uL (ref 3.87–5.11)
RDW: 19.3 % — ABNORMAL HIGH (ref 11.5–15.5)
WBC: 4.9 10*3/uL (ref 4.0–10.5)
nRBC: 0.4 % — ABNORMAL HIGH (ref 0.0–0.2)

## 2022-11-08 LAB — BASIC METABOLIC PANEL
Anion gap: 6 (ref 5–15)
BUN: 6 mg/dL — ABNORMAL LOW (ref 8–23)
CO2: 22 mmol/L (ref 22–32)
Calcium: 7.4 mg/dL — ABNORMAL LOW (ref 8.9–10.3)
Chloride: 108 mmol/L (ref 98–111)
Creatinine, Ser: 0.7 mg/dL (ref 0.44–1.00)
GFR, Estimated: >60 mL/min (ref >60)
Glucose, Bld: 91 mg/dL (ref 70–99)
Potassium: 3.3 mmol/L — ABNORMAL LOW (ref 3.5–5.1)
Sodium: 136 mmol/L (ref 135–145)

## 2022-11-08 LAB — MAGNESIUM: Magnesium: 1.7 mg/dL (ref 1.7–2.4)

## 2022-11-08 MED ORDER — MAGNESIUM SULFATE 2 GM/50ML IV SOLN
2.0000 g | Freq: Once | INTRAVENOUS | Status: AC
  Administered 2022-11-08: 2 g via INTRAVENOUS
  Filled 2022-11-08: qty 50

## 2022-11-08 MED ORDER — DEXTROSE-SODIUM CHLORIDE 5-0.45 % IV SOLN: INTRAVENOUS | Status: AC

## 2022-11-08 MED ORDER — PREDNISONE 5 MG/5ML PO SOLN
20.0000 mg | Freq: Every day | ORAL | Status: DC
  Filled 2022-11-08 (×3): qty 20

## 2022-11-08 MED ORDER — GLUCAGON HCL RDNA (DIAGNOSTIC) 1 MG IJ SOLR: 1.0000 mg | INTRAMUSCULAR | Status: DC | PRN

## 2022-11-08 MED ORDER — POTASSIUM CHLORIDE 10 MEQ/100ML IV SOLN
10.0000 meq | INTRAVENOUS | Status: AC
  Administered 2022-11-08 (×6): 10 meq via INTRAVENOUS
  Filled 2022-11-08 (×6): qty 100

## 2022-11-08 NOTE — Progress Notes (Signed)
Family requesting transfer to [facility] for second opinion. At their request I called [facility] Transfer line, and the case was reviewed by Dr Delia Chimes.  At this time, [facility] doesn't have anything different to offer, and recommending continuing current care.

## 2022-11-08 NOTE — Progress Notes (Signed)
PROGRESS NOTE    Barbara Nelson  VZD:[id number] DOB: 12-07-1940 DOA: 11/03/2022 PCP: [name], MD   Brief Narrative:  82 year old with history of chronic pulmonary blasto cytosis status post lobectomy 2014, chronic hypoxia, recurrent admissions for UTI, pneumonia, dysphagia with recurrent aspiration, HTN brought to the hospital from skilled nursing facility for altered mental status.  Apparently patient has been having severe dysphagia at the facility with poor oral nutrition.  Now there is concerns of dehydration therefore brought to the hospital.  Upon admission chest x-ray was overall unremarkable, CT of the head was negative.  There was clinical signs of dehydration with hypernatremia.  Patient getting D5 water.  Seen by speech and swallow therapy, high aspiration risk.  Eventually family was leaning towards taking patient home.   Assessment & Plan:  Principal Problem:   Acute encephalopathy Active Problems:   Chronic pulmonary blastomycosis (HCC)   Essential hypertension   Dehydration   FTT (failure to thrive) in adult   Hypernatremia   Hypokalemia   Chronic respiratory failure with hypoxia (HCC)   Dysphagia     Acute metabolic encephalopathy, toxic.  Improved -Mentation is stable at this time, metabolic workup negative.  CT head, ammonia, TSH, B12 are all normal.   Dysphagia  Odynophagia Recently completed 14-day course of Diflucan, now on PPI and Carafate.  At this time family does not want any advanced feeding tube.  Oral diet as tolerated.  Unfortunately she remains at risk of aspiration and chronic in nature.  Feeding continues to be an issue with very poor oral intake.  Recurrent hypoglycemia - Secondary to very poor oral intake.  Currently on amp of D50/glucagon as needed.  D5 fluids.  Will add liquid steroids  Folic acid deficiency - IV folate   Hypernatremia/hypokalemia Resolved.     Chronic hypoxic respiratory failure; recent pneumonia with  parapneumonic effusion   Recently discharged on cefdinir for total of 28 days, last day 11/27/2022.  For now due to poor oral intake, IV Rocephin   Essential hypertension Toprol-XL.  IV as needed   Urinary retention  - In and out cath as needed for now  Severe protein calorie malnutrition Adult failure to thrive Severe dysphagia with recurrent aspirations supportive care  Constipation - Bowel regimen  Family understands that she is at her recurrent high risk for aspiration in the meantime she continues to decline due to poor oral intake.  Electrolytes and lab work is improved with IV fluids but fever still remains at this will be recurrent issue after discharge.  Family at this time is leaning towards taking patient home with possible hospice.  TOC has been consulted.  DVT prophylaxis: Lovenox Code Status: DNR Family Communication: Daughter at bedside and spoke with him over the phone [facility] working with family for safe disposition   Diet Orders (From admission, onward)     Start     Ordered   11/05/22 0935  DIET - DYS 1 Room service appropriate? Yes with Assist; Fluid consistency: Nectar Thick  Diet effective now       Question Answer Comment  Room service appropriate? Yes with Assist   Fluid consistency: Nectar Thick      11/05/22 0934            Subjective: Seen at bedside.  Patient appears drowsy.  Very poor oral intake Daughter at bedside and also spoke with other daughters over the phone. Explained them that patient's poor oral intake is leading to recurrent hypoglycemia.  Examination:  Constitutional: Elderly frail but not in any acute distress Respiratory: Some rhonchi Cardiovascular: Normal sinus rhythm, no rubs Abdomen: Nontender nondistended good bowel sounds Musculoskeletal: 1+ bilateral lower extremity pitting edema Skin: No rashes seen Neurologic: Gross moving all the extremities Psychiatric: Poor judgment and insight  Objective: Vitals:   11/07/22  0526 11/07/22 1300 11/07/22 2338 11/08/22 0550  BP: 104/71 (!) 145/103 121/68 106/79  Pulse: 85 81 82 84  Resp: 18 12 20 15   Temp: 97.6 F (36.4 C) (!) 97.5 F (36.4 C) (!) 97.5 F (36.4 C) (!) 97.3 F (36.3 C)  TempSrc: Oral Oral Oral Oral  SpO2: 99% 100% 98% 97%  Weight:      Height:        Intake/Output Summary (Last 24 hours) at 11/08/2022 1610 Last data filed at 11/08/2022 0800 Gross per 24 hour  Intake 379.5 ml  Output 1 ml  Net 378.5 ml   Filed Weights   11/03/22 1902 11/05/22 0500  Weight: 86.2 kg 83.6 kg    Scheduled Meds:  docusate sodium  100 mg Oral BID   enoxaparin (LOVENOX) injection  40 mg Subcutaneous Q24H   folic acid  1 mg Intravenous Daily   Gerhardt's butt cream   Topical BID   ketotifen  1 drop Both Eyes BID   metoprolol succinate  12.5 mg Oral QPM   pantoprazole (PROTONIX) IV  40 mg Intravenous Q24H   polyethylene glycol  17 g Oral Daily   sucralfate  1 g Oral TID WC & HS   Continuous Infusions:  cefTRIAXone (ROCEPHIN)  IV 2 g (11/07/22 1352)   dextrose 5 % and 0.45 % NaCl Stopped (11/08/22 0811)    Nutritional status     Body mass index is 29.75 kg/m.  Data Reviewed:   CBC: Recent Labs  Lab 11/03/22 2020 11/04/22 0813 11/05/22 0824 11/06/22 0840 11/07/22 0831  WBC 6.3 6.0 4.9 4.9 4.3  NEUTROABS 4.0  --   --   --   --   HGB 11.0* 10.5* 10.8* 12.0 12.2  HCT 34.5* 33.2* 34.1* 36.7 37.0  MCV 87.8 91.5 89.0 87.2 84.9  PLT 174 151 144* 122* 113*   Basic Metabolic Panel: Recent Labs  Lab 11/03/22 2020 11/04/22 0813 11/04/22 0915 11/05/22 0824 11/06/22 0840 11/07/22 0831  NA 150* 149*  --  141 137 136  K 2.6* 3.0*  --  3.0* 3.3* 3.8  CL 115* 116*  --  109 107 107  CO2 31 28  --  26 25 22   GLUCOSE 95 127*  --  109* 99 83  BUN 10 8  --  6* 7* 6*  CREATININE 0.69 0.61  --  0.57 0.64 0.60  CALCIUM 8.8* 8.3*  --  8.0* 7.7* 7.5*  MG 2.4 2.0  --  2.0 1.8 1.7  PHOS  --  1.9* 1.9*  --  2.1* 3.8   GFR: Estimated Creatinine  Clearance: 59.1 mL/min (by C-G formula based on SCr of 0.6 mg/dL). Liver Function Tests: Recent Labs  Lab 11/03/22 2020  AST 44*  ALT 25  ALKPHOS 123  BILITOT 0.7  PROT 6.3*  ALBUMIN 1.7*   No results for input(s): "LIPASE", "AMYLASE" in the last 168 hours. Recent Labs  Lab 11/03/22 2020  AMMONIA 14   Coagulation Profile: No results for input(s): "INR", "PROTIME" in the last 168 hours. Cardiac Enzymes: No results for input(s): "CKTOTAL", "CKMB", "CKMBINDEX", "TROPONINI" in the last 168 hours. BNP (last 3 results) No results for input(s): "  PROBNP" in the last 8760 hours. HbA1C: No results for input(s): "HGBA1C" in the last 72 hours. CBG: Recent Labs  Lab 11/06/22 1647 11/07/22 0906 11/07/22 1230 11/07/22 1636 11/08/22 0714  GLUCAP 108* 66* 78 68* 76   Lipid Profile: No results for input(s): "CHOL", "HDL", "LDLCALC", "TRIG", "CHOLHDL", "LDLDIRECT" in the last 72 hours. Thyroid Function Tests: No results for input(s): "TSH", "T4TOTAL", "FREET4", "T3FREE", "THYROIDAB" in the last 72 hours.  Anemia Panel: Recent Labs    11/05/22 0824  FOLATE 4.5*   Sepsis Labs: Recent Labs  Lab 11/03/22 2020  LATICACIDVEN 1.0    No results found for this or any previous visit (from the past 240 hour(s)).       Radiology Studies: No results found.         LOS: 5 days   Time spent= 35 mins    Dayron Odland Joline Maxcy, MD [facility]  If 7PM-7AM, please contact night-coverage  11/08/2022, 8:12 AM

## 2022-11-09 DIAGNOSIS — Z711 Person with feared health complaint in whom no diagnosis is made: Secondary | ICD-10-CM

## 2022-11-09 DIAGNOSIS — R4589 Other symptoms and signs involving emotional state: Secondary | ICD-10-CM

## 2022-11-09 DIAGNOSIS — Z7189 Other specified counseling: Secondary | ICD-10-CM

## 2022-11-09 DIAGNOSIS — Z66 Do not resuscitate: Secondary | ICD-10-CM

## 2022-11-09 DIAGNOSIS — Z515 Encounter for palliative care: Secondary | ICD-10-CM | POA: Diagnosis not present

## 2022-11-09 DIAGNOSIS — G9340 Encephalopathy, unspecified: Secondary | ICD-10-CM | POA: Diagnosis not present

## 2022-11-09 DIAGNOSIS — E860 Dehydration: Secondary | ICD-10-CM | POA: Diagnosis not present

## 2022-11-09 DIAGNOSIS — E876 Hypokalemia: Secondary | ICD-10-CM | POA: Diagnosis not present

## 2022-11-09 LAB — CBC
HCT: 35.3 % — ABNORMAL LOW (ref 36.0–46.0)
Hemoglobin: 11.8 g/dL — ABNORMAL LOW (ref 12.0–15.0)
MCH: 28.4 pg (ref 26.0–34.0)
MCHC: 33.4 g/dL (ref 30.0–36.0)
MCV: 84.9 fL (ref 80.0–100.0)
Platelets: 85 10*3/uL — ABNORMAL LOW (ref 150–400)
RBC: 4.16 MIL/uL (ref 3.87–5.11)
RDW: 19.3 % — ABNORMAL HIGH (ref 11.5–15.5)
WBC: 4.5 10*3/uL (ref 4.0–10.5)
nRBC: 0 % (ref 0.0–0.2)

## 2022-11-09 LAB — BASIC METABOLIC PANEL
Anion gap: 4 — ABNORMAL LOW (ref 5–15)
BUN: 5 mg/dL — ABNORMAL LOW (ref 8–23)
CO2: 22 mmol/L (ref 22–32)
Calcium: 7.4 mg/dL — ABNORMAL LOW (ref 8.9–10.3)
Chloride: 110 mmol/L (ref 98–111)
Creatinine, Ser: 0.55 mg/dL (ref 0.44–1.00)
GFR, Estimated: >60 mL/min (ref >60)
Glucose, Bld: 98 mg/dL (ref 70–99)
Potassium: 3.7 mmol/L (ref 3.5–5.1)
Sodium: 136 mmol/L (ref 135–145)

## 2022-11-09 LAB — GLUCOSE, CAPILLARY
Glucose-Capillary: 75 mg/dL (ref 70–99)
Glucose-Capillary: 90 mg/dL (ref 70–99)
Glucose-Capillary: 95 mg/dL (ref 70–99)
Glucose-Capillary: 97 mg/dL (ref 70–99)

## 2022-11-09 LAB — MAGNESIUM: Magnesium: 2 mg/dL (ref 1.7–2.4)

## 2022-11-09 MED ORDER — HYDROCORTISONE ACETATE 25 MG RE SUPP
25.0000 mg | Freq: Once | RECTAL | Status: AC
  Administered 2022-11-09: 25 mg via RECTAL
  Filled 2022-11-09: qty 1

## 2022-11-09 NOTE — [place] FL2 (Signed)
[place] MEDICAID FL2 LEVEL OF CARE FORM     IDENTIFICATION  Patient Name: Barbara Nelson Birthdate: 11/24/1940 Sex: female Admission Date (Current Location): 11/03/2022  [facility] and [place] Number:  Producer, television/film/video and Address:  [facility],  [address] [place], [place] [address]      Provider Number: [id number]  Attending Physician Name and Address:  Dimple Nanas, MD  Relative Name and Phone Number:  daughter Ricky Stabs @ [phone]    Current Level of Care: Hospital Recommended Level of Care: Skilled Nursing Facility Prior Approval Number:    Date Approved/Denied:   PASRR Number: [id number]  Discharge Plan: SNF    Current Diagnoses: Patient Active Problem List   Diagnosis Date Noted   Acute encephalopathy 11/03/2022   Hypernatremia 11/03/2022   Hypokalemia 11/03/2022   Chronic respiratory failure with hypoxia (HCC) 11/03/2022   Dysphagia 11/03/2022   Altered mental state 10/30/2022   Need for emotional support 10/23/2022   Counseling and coordination of care 10/23/2022   Poor appetite 10/23/2022   Palliative care encounter 10/21/2022   Weakness of both lower extremities 10/21/2022   DNR (do not resuscitate) 10/21/2022   Goals of care, counseling/discussion 10/21/2022   Malnutrition of moderate degree 10/19/2022   Oral candidiasis 10/12/2022   FTT (failure to thrive) in adult 10/12/2022   Generalized weakness 10/12/2022   Aspiration pneumonia (HCC) 10/11/2022   Urinary retention 07/27/2022   Nausea and vomiting 07/10/2022   Pressure injury of skin 07/10/2022   Unspecified atrial fibrillation (HCC) 07/01/2022   Acute metabolic encephalopathy 06/30/2022   Hypercalcemia 06/30/2022   Dehydration 06/30/2022   Acute prerenal azotemia 06/30/2022   Transaminitis 06/30/2022   Constipation 06/30/2022   DM2 (diabetes mellitus, type 2) (HCC) 06/30/2022   Allergic rhinitis 06/30/2022   Chronic diastolic CHF (congestive heart  failure) (HCC) 06/30/2022   Dyspnea 06/20/2022   Acute hypoxic respiratory failure (HCC) 06/18/2022   Hyponatremia 06/18/2022   Body mass index (BMI) 39.0-39.9, adult 01/05/2022   Decreased estrogen level 01/05/2022   Prediabetes 01/05/2022   Dry eyes 01/05/2022   History of pericarditis 01/05/2022   Essential hypertension 01/05/2022   LBBB (left bundle branch block) 09/17/2017   Benign essential HTN 10/31/2015   Heart palpitations 10/31/2015   Lung mass 06/27/2013   Edema of extremities 06/27/2013   Pericarditis, viral    History of pericardiectomy 06/19/2013   Chronic pulmonary blastomycosis (HCC) 08/23/2012   Pulmonary nodule, right 03/09/2012   Pericardial effusion/ pericarditis 10/06/2011    Orientation RESPIRATION BLADDER Height & Weight     Self, Time, Place  O2 Incontinent Weight: 83.6 kg Height:  5\' 6"  (167.6 cm)  BEHAVIORAL SYMPTOMS/MOOD NEUROLOGICAL BOWEL NUTRITION STATUS      Incontinent Diet (Dys 1 nectar thick)  AMBULATORY STATUS COMMUNICATION OF NEEDS Skin   Extensive Assist Verbally Other (Comment) (incontinence dermatitis)                       Personal Care Assistance Level of Assistance  Bathing, Feeding, Dressing Bathing Assistance: Limited assistance Feeding assistance: Limited assistance Dressing Assistance: Limited assistance     Functional Limitations Info  Sight, Hearing, Speech Sight Info: Adequate Hearing Info: Adequate Speech Info: Adequate    SPECIAL CARE FACTORS FREQUENCY  PT (By licensed PT), OT (By licensed OT)     PT Frequency: 5x /week OT Frequency: 5x/week            Contractures Contractures Info: Not present  Additional Factors Info  Code Status, Allergies Code Status Info: DNR Allergies Info: latex, penicillins, tape, z-pak, dairycare,  egg-derived products, potassium, amoxil, sulfa           Current Medications (11/09/2022):  This is the current hospital active medication list Current  Facility-Administered Medications  Medication Dose Route Frequency Provider Last Rate Last Admin   acetaminophen (TYLENOL) tablet 650 mg  650 mg Oral Q6H PRN Opyd, Lavone Neri, MD   650 mg at 11/06/22 1845   Or   acetaminophen (TYLENOL) suppository 650 mg  650 mg Rectal Q6H PRN Opyd, Lavone Neri, MD   650 mg at 11/09/22 0139   cefTRIAXone (ROCEPHIN) 2 g in sodium chloride 0.9 % 100 mL IVPB  2 g Intravenous Q24H Amin, Ankit Chirag, MD 200 mL/hr at 11/08/22 1319 2 g at 11/08/22 1319   dextrose 50 % solution 50 mL  1 ampule Intravenous PRN Amin, Ankit Chirag, MD   50 mL at 11/07/22 1737   docusate sodium (COLACE) capsule 100 mg  100 mg Oral BID Amin, Ankit Chirag, MD       enoxaparin (LOVENOX) injection 40 mg  40 mg Subcutaneous Q24H Opyd, Lavone Neri, MD   40 mg at 11/08/22 1610   folic acid injection 1 mg  1 mg Intravenous Daily Amin, Ankit Chirag, MD   1 mg at 11/08/22 1011   Gerhardt's butt cream   Topical BID Dimple Nanas, MD   Given at 11/08/22 2100   glucagon (human recombinant) (GLUCAGEN) injection 1 mg  1 mg Intravenous PRN Amin, Ankit Chirag, MD       hydrALAZINE (APRESOLINE) injection 10 mg  10 mg Intravenous Q4H PRN Amin, Ankit Chirag, MD       ipratropium-albuterol (DUONEB) 0.5-2.5 (3) MG/3ML nebulizer solution 3 mL  3 mL Nebulization Q4H PRN Amin, Ankit Chirag, MD       ketotifen (ZADITOR) 0.035 % ophthalmic solution 1 drop  1 drop Both Eyes BID Opyd, Lavone Neri, MD   1 drop at 11/08/22 0944   metoprolol succinate (TOPROL-XL) 24 hr tablet 12.5 mg  12.5 mg Oral QPM Opyd, Lavone Neri, MD   12.5 mg at 11/06/22 1838   metoprolol tartrate (LOPRESSOR) injection 5 mg  5 mg Intravenous Q4H PRN Amin, Ankit Chirag, MD       ondansetron (ZOFRAN) injection 4 mg  4 mg Intravenous Q6H PRN Amin, Ankit Chirag, MD       pantoprazole (PROTONIX) injection 40 mg  40 mg Intravenous Q24H Opyd, Lavone Neri, MD   40 mg at 11/08/22 0944   polyethylene glycol (MIRALAX / GLYCOLAX) packet 17 g  17 g Oral Daily Amin,  Ankit Chirag, MD   17 g at 11/06/22 1108   predniSONE 5 MG/5ML solution 20 mg  20 mg Oral Q breakfast Amin, Ankit Chirag, MD       sucralfate (CARAFATE) 1 GM/10ML suspension 1 g  1 g Oral TID WC & HS Opyd, Lavone Neri, MD   1 g at 11/08/22 9604     Discharge Medications: Please see discharge summary for a list of discharge medications.  Relevant Imaging Results:  Relevant Lab Results:   Additional Information SSn: [id number]  Armanda Heritage, RN

## 2022-11-09 NOTE — Progress Notes (Signed)
PROGRESS NOTE    Barbara Nelson  LKG:[id number] DOB: July 19, 1940 DOA: 11/03/2022 PCP: [name], MD   Brief Narrative:  82 year old with history of chronic pulmonary blasto cytosis status post lobectomy 2014, chronic hypoxia, recurrent admissions for UTI, pneumonia, dysphagia with recurrent aspiration, HTN brought to the hospital from skilled nursing facility for altered mental status.  Apparently patient has been having severe dysphagia at the facility with poor oral nutrition.  Now there is concerns of dehydration therefore brought to the hospital.  Upon admission chest x-ray was overall unremarkable, CT of the head was negative.  There was clinical signs of dehydration with hypernatremia.  Patient getting D5 water.  Seen by speech and swallow therapy, high aspiration risk.  Eventually family was leaning towards taking patient home.  Family also requested I discuss her care with [facility] for potential transfer.  I spoke with their service and patient was declined for transfer and recommending continuing current care as they do not have anything different to offer.   Assessment & Plan:  Principal Problem:   Acute encephalopathy Active Problems:   Chronic pulmonary blastomycosis (HCC)   Essential hypertension   Dehydration   FTT (failure to thrive) in adult   Hypernatremia   Hypokalemia   Chronic respiratory failure with hypoxia (HCC)   Dysphagia     Acute metabolic encephalopathy, toxic.  Improved -Mentation is stable at this time, metabolic workup negative.  CT head, ammonia, TSH, B12 are all normal.   Dysphagia  Odynophagia Recently completed 14-day course of Diflucan, now on PPI and Carafate.  Initially family had declined advanced feeding tube but now due to risk of aspiration, hypoglycemia and poor oral intake I have offered family to reconsider this.  Understandably family is struggling making decision regarding her long-term goals of care.  Due to these reasons I went ahead  and consulted palliative care  Recurrent hypoglycemia - Secondary to very poor oral intake.  Currently on amp of D50/glucagon as needed.  Cont D5 and Steroids.   Folic acid deficiency - IV folate   Hypernatremia/hypokalemia Resolved.     Chronic hypoxic respiratory failure; recent pneumonia with parapneumonic effusion   Recently discharged on cefdinir for total of 28 days, last day 11/27/2022.  For now due to poor oral intake, IV Rocephin   Essential hypertension Toprol-XL.  IV as needed   Urinary retention  - In and out cath as needed for now  Severe protein calorie malnutrition Adult failure to thrive Severe dysphagia with recurrent aspirations supportive care  Constipation - Bowel regimen  Family understands she is at high risk but at this time continues to be indecisive.  They understand patient has very poor prognosis especially with very poor oral intake.  Palliative care team is also following the patient.  DVT prophylaxis: Lovenox Code Status: DNR Family Communication: Daughters Updated.  TOC working with family for safe disposition   Diet Orders (From admission, onward)     Start     Ordered   11/05/22 0935  DIET - DYS 1 Room service appropriate? Yes with Assist; Fluid consistency: Nectar Thick  Diet effective now       Question Answer Comment  Room service appropriate? Yes with Assist   Fluid consistency: Nectar Thick      11/05/22 0934            Subjective: Seen and examined at bedside.  Patient remains very drowsy and fatigued.  Reporting of some eye, hip, hand and leg swelling.  Very  minimal oral intake take overnight  Examination: Constitutional: Not in acute distress, frail.  Chronically ill-appearing Respiratory: Clear to auscultation bilaterally-anteriorly Cardiovascular: Normal sinus rhythm, no rubs Abdomen: Nontender nondistended good bowel sounds Musculoskeletal: No edema noted Skin: No rashes seen Neurologic: Grossly moving all the  extremities Psychiatric: Unable to fully assess  Objective: Vitals:   11/08/22 0550 11/08/22 1315 11/08/22 2158 11/09/22 0648  BP: 106/79 (!) 131/94 106/75 121/79  Pulse: 84 81 81 80  Resp: 15 12 18 16   Temp: (!) 97.3 F (36.3 C) (!) 97.5 F (36.4 C) (!) 97.2 F (36.2 C) (!) 97.2 F (36.2 C)  TempSrc: Oral Oral    SpO2: 97% 98% 99% 99%  Weight:      Height:        Intake/Output Summary (Last 24 hours) at 11/09/2022 0807 Last data filed at 11/09/2022 0630 Gross per 24 hour  Intake 1700.93 ml  Output 4 ml  Net 1696.93 ml   Filed Weights   11/03/22 1902 11/05/22 0500  Weight: 86.2 kg 83.6 kg    Scheduled Meds:  docusate sodium  100 mg Oral BID   enoxaparin (LOVENOX) injection  40 mg Subcutaneous Q24H   folic acid  1 mg Intravenous Daily   Gerhardt's butt cream   Topical BID   ketotifen  1 drop Both Eyes BID   metoprolol succinate  12.5 mg Oral QPM   pantoprazole (PROTONIX) IV  40 mg Intravenous Q24H   polyethylene glycol  17 g Oral Daily   predniSONE  20 mg Oral Q breakfast   sucralfate  1 g Oral TID WC & HS   Continuous Infusions:  cefTRIAXone (ROCEPHIN)  IV 2 g (11/08/22 1319)   dextrose 5 % and 0.45 % NaCl 50 mL/hr at 11/09/22 0424    Nutritional status     Body mass index is 29.75 kg/m.  Data Reviewed:   CBC: Recent Labs  Lab 11/03/22 2020 11/04/22 0813 11/05/22 0824 11/06/22 0840 11/07/22 0831 11/08/22 1002  WBC 6.3 6.0 4.9 4.9 4.3 4.9  NEUTROABS 4.0  --   --   --   --   --   HGB 11.0* 10.5* 10.8* 12.0 12.2 11.8*  HCT 34.5* 33.2* 34.1* 36.7 37.0 37.2  MCV 87.8 91.5 89.0 87.2 84.9 88.4  PLT 174 151 144* 122* 113* 99*   Basic Metabolic Panel: Recent Labs  Lab 11/04/22 0813 11/04/22 0915 11/05/22 0824 11/06/22 0840 11/07/22 0831 11/08/22 1002  NA 149*  --  141 137 136 136  K 3.0*  --  3.0* 3.3* 3.8 3.3*  CL 116*  --  109 107 107 108  CO2 28  --  26 25 22 22   GLUCOSE 127*  --  109* 99 83 91  BUN 8  --  6* 7* 6* 6*  CREATININE 0.61   --  0.57 0.64 0.60 0.70  CALCIUM 8.3*  --  8.0* 7.7* 7.5* 7.4*  MG 2.0  --  2.0 1.8 1.7 1.7  PHOS 1.9* 1.9*  --  2.1* 3.8  --    GFR: Estimated Creatinine Clearance: 59.1 mL/min (by C-G formula based on SCr of 0.7 mg/dL). Liver Function Tests: Recent Labs  Lab 11/03/22 2020  AST 44*  ALT 25  ALKPHOS 123  BILITOT 0.7  PROT 6.3*  ALBUMIN 1.7*   No results for input(s): "LIPASE", "AMYLASE" in the last 168 hours. Recent Labs  Lab 11/03/22 2020  AMMONIA 14   Coagulation Profile: No results for input(s): "INR", "PROTIME"  in the last 168 hours. Cardiac Enzymes: No results for input(s): "CKTOTAL", "CKMB", "CKMBINDEX", "TROPONINI" in the last 168 hours. BNP (last 3 results) No results for input(s): "PROBNP" in the last 8760 hours. HbA1C: No results for input(s): "HGBA1C" in the last 72 hours. CBG: Recent Labs  Lab 11/08/22 0714 11/08/22 1205 11/08/22 1709 11/09/22 0010 11/09/22 0714  GLUCAP 76 91 105* 95 97   Lipid Profile: No results for input(s): "CHOL", "HDL", "LDLCALC", "TRIG", "CHOLHDL", "LDLDIRECT" in the last 72 hours. Thyroid Function Tests: No results for input(s): "TSH", "T4TOTAL", "FREET4", "T3FREE", "THYROIDAB" in the last 72 hours.  Anemia Panel: No results for input(s): "VITAMINB12", "FOLATE", "FERRITIN", "TIBC", "IRON", "RETICCTPCT" in the last 72 hours.  Sepsis Labs: Recent Labs  Lab 11/03/22 2020  LATICACIDVEN 1.0    No results found for this or any previous visit (from the past 240 hour(s)).       Radiology Studies: No results found.         LOS: 6 days   Time spent= 35 mins    Barbara Burdine Joline Maxcy, MD [facility]  If 7PM-7AM, please contact night-coverage  11/09/2022, 8:07 AM

## 2022-11-09 NOTE — Consult Note (Addendum)
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  Patient had recently been hospitalized for similar presentation.  During this hospitalization, patient evaluated by SLP and noted to have chronic aspiration leading to aspiration pneumonia.  Patient's oral intake has been minimal to none requiring supplementation through D5 and prednisone.  Palliative medicine team consulted to assist with complex medical decision making.  Discussed care with hospitalist prior to presenting to bedside.  Discussed care with PT/OT, patient not participating in any therapies and totally dependent on care.  Patient previously known to this palliative medicine provider due to visits during recent hospitalization with patient and family.  At that time family had expressed continued hope for "fixing the fixable".  Despite aggressive medical measures, patient's medical status has continued to deteriorate.  Presented to bedside in AM to discuss care with patient's family.  Patient seen placed in chair at bedside via lift.  Patient is lethargic and will minimally respond to verbal or tactile stimuli.  Patient appears tired. Spoke with patient's daughter Barbara Nelson at bedside. She was able to place her sister, Barbara Nelson, and the patient's sister, Barbara Nelson, on speakphone for conversation.  Introduced myself as a member of the palliative medicine team and  a geriatrician.  Family remembered speaking with me from patient's recent hospitalization.  Able to discuss patient's medical journey since she was last seen in the hospital.  Discussed patient's continued deterioration and lack of oral intake.  Family noted they are considering feeding tube at this point.  With permission, able to discuss aspects of care regarding feeding tube management in someone who is actively dying from chronic medical conditions.  Spent time explaining that a feeding tubes do not prevent aspiration.  In fact when people are already aspirating, placing a feeding tube means the patient will only aspirate on the food you are physically pumping and creating a great source for infection.  Stated that feeding tubes are appropriate when it is a bridge to improvement, patient is not appropriate for feeding tube at this point based on her continued functional decline. Feeding tube would not be a bridge to improved quality or quantity of time.  Acknowledged family had expressed wishes to "fix the fixable" and believe that everything that can be medically reversed has been and patient is still dying. Continued to express worry patient is at the end of her life.   Discussed that patient has signs of multisystem organ failure and is essentially requiring multiple forms of life support (through medications and IVF) at this time to maintain her presence.  Spent time discussing with family pathways for medical care moving forward.  Discussed that even with feeding tube, do not believe patient will be able to participate in aggressive rehabilitation.  Believe patient's medical status will continue to deteriorate.  With permission, able to provide recommendation that patient should return home with hospice to focus on quality time with family.  Discussed inevitably this boils down to  quality versus quantity time and how patient will want to spend the time herself.  Patient had already expressed to family  during last hospitalization she would never want a feeding tube.  Noted we should honor patient's wishes regarding her own care.  Patient's body is leading the way and despite aggressive medical interventions, has continued to fail.  Patient should be allowed to have time at home with family at the end of her life.  Spent time providing emotional support via active listening.  Answered all questions as able at that time.  Noted palliative medicine team would be available during patient's journey.  Primary Diagnoses  Present on Admission:  Acute encephalopathy  FTT (failure to thrive) in adult  Hypernatremia  Hypokalemia  Essential hypertension  Dehydration  Chronic pulmonary blastomycosis (HCC)  Chronic respiratory failure with hypoxia (HCC)   Palliative Review of Systems: Patient unresponsive to verbal or tactile stimuli   Past Medical History:  Diagnosis Date   Benign essential HTN 10/31/2015   Cardiac tamponade due to viral pericarditis    LBBB (left bundle branch block)    Lung mass    fungal infection s/p resection at [facility]   Postoperative atrial fibrillation (HCC)    Pulmonary nodule    Social History   Socioeconomic History   Marital status: Single    Spouse name: Not on file   Number of children: 4   Years of education: Not on file   Highest education level: Not on file  Occupational History   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  Tobacco Use   Smoking status: Former    Packs/day: 1.50    Years: 10.00    Additional pack years: 0.00    Total pack years: 15.00    Types: Cigarettes    Quit date: 05/19/1979    Years since quitting: 43.5   Smokeless tobacco: Never  Vaping Use   Vaping Use: Never used  Substance and Sexual Activity   Alcohol use: No   Drug use: No   Sexual activity: Not on file  Other Topics Concern   Not on file  Social History Narrative   Lives with daughter Barbara Nelson   Social Determinants of Health   Financial Resource Strain: Not on file  Food  Insecurity: No Food Insecurity (11/03/2022)   Hunger Vital Sign    Worried About Running Out of Food in the Last Year: Never true    Ran Out of Food in the Last Year: Never true  Transportation Needs: No Transportation Needs (11/03/2022)   PRAPARE - Administrator, Civil Service (Medical): No    Lack of Transportation (Non-Medical): No  Physical Activity: Not on file  Stress: Not on file  Social Connections: Not on file   Family History  Problem Relation Age of Onset   Hypertension Father    Stroke Father    Hypertension Mother    Ovarian cancer Mother    Prostate cancer Brother    Thyroid disease Sister    Hypertension Sister    Leukemia Maternal Aunt    Scheduled Meds:  docusate sodium  100 mg Oral BID   enoxaparin (LOVENOX) injection  40 mg Subcutaneous Q24H   folic acid  1 mg Intravenous Daily   Gerhardt's butt cream   Topical BID   ketotifen  1 drop Both Eyes BID   metoprolol succinate  12.5 mg Oral QPM   pantoprazole (PROTONIX) IV  40 mg Intravenous Q24H   polyethylene glycol  17 g Oral  Daily   predniSONE  20 mg Oral Q breakfast   sucralfate  1 g Oral TID WC & HS   Continuous Infusions:  cefTRIAXone (ROCEPHIN)  IV 2 g (11/08/22 1319)   PRN Meds:.acetaminophen **OR** acetaminophen, dextrose, glucagon (human recombinant), hydrALAZINE, ipratropium-albuterol, metoprolol tartrate, ondansetron (ZOFRAN) IV Allergies  Allergen Reactions   Latex Rash   Penicillins Rash and Itching   Tape Hives and Rash   Z-Pak [Azithromycin] Rash and Other (See Comments)    Mouth sores   Dairycare [Lactase-Lactobacillus] Other (See Comments)    intolerance   Egg-Derived Products Nausea And Vomiting   Potassium Nausea And Vomiting   Amoxil [Amoxicillin] Rash   Sulfa Antibiotics Rash   CBC:    Component Value Date/Time   WBC 4.5 11/09/2022 0718   HGB 11.8 (L) 11/09/2022 0718   HCT 35.3 (L) 11/09/2022 0718   PLT 85 (L) 11/09/2022 0718   MCV 84.9 11/09/2022 0718    NEUTROABS 4.0 11/03/2022 2020   LYMPHSABS 1.3 11/03/2022 2020   MONOABS 0.8 11/03/2022 2020   EOSABS 0.0 11/03/2022 2020   BASOSABS 0.0 11/03/2022 2020   Comprehensive Metabolic Panel:    Component Value Date/Time   NA 136 11/09/2022 0718   K 3.7 11/09/2022 0718   CL 110 11/09/2022 0718   CO2 22 11/09/2022 0718   BUN 5 (L) 11/09/2022 0718   CREATININE 0.55 11/09/2022 0718   GLUCOSE 98 11/09/2022 0718   CALCIUM 7.4 (L) 11/09/2022 0718   AST 44 (H) 11/03/2022 2020   ALT 25 11/03/2022 2020   ALKPHOS 123 11/03/2022 2020   BILITOT 0.7 11/03/2022 2020   PROT 6.3 (L) 11/03/2022 2020   ALBUMIN 1.7 (L) 11/03/2022 2020    Physical Exam: Vital Signs: BP 121/79 (BP Location: Left Wrist)   Pulse 80   Temp (!) 97.2 F (36.2 C)   Resp 16   Ht 5\' 6"  (1.676 m)   Wt 83.6 kg   LMP  (LMP Unknown)   SpO2 99%   BMI 29.75 kg/m  SpO2: SpO2: 99 % O2 Device: O2 Device: Nasal Cannula O2 Flow Rate: O2 Flow Rate (L/min): 1 L/min Intake/output summary:  Intake/Output Summary (Last 24 hours) at 11/09/2022 0931 Last data filed at 11/09/2022 0630 Gross per 24 hour  Intake 1662.92 ml  Output 4 ml  Net 1658.92 ml   LBM: Last BM Date : 11/08/22 Baseline Weight: Weight: 86.2 kg Most recent weight: Weight: 83.6 kg  General: chronically ill appearing, unresponsive to verbal or tactile stimuli Eyes:no drainage noted HENT: dry mucous membranes Cardiovascular: RRR Respiratory: no increased work of breathing noted, not in respiratory distress Abdomen: not distended Neuro: unresponsive          Palliative Performance Scale: 10%              Additional Data Reviewed: Recent Labs    11/08/22 1002 11/09/22 0718  WBC 4.9 4.5  HGB 11.8* 11.8*  PLT 99* 85*  NA 136 136  BUN 6* 5*  CREATININE 0.70 0.55    Imaging: DG Chest Port 1 View CLINICAL DATA:  AMS  EXAM: PORTABLE CHEST - 1 VIEW  COMPARISON:  10/20/2022  FINDINGS: For significant improvement in right upper lung airspace  disease. Slight decrease in left retrocardiac consolidation/atelectasis. Small residual pleural effusions.  Stable mild cardiomegaly.  No pneumothorax.  Visualized bones unremarkable. Residual contrast in the splenic flexure of the colon.  IMPRESSION: Improving right upper lung and left retrocardiac airspace disease.  Electronically Signed  By: Corlis Leak M.D.   On: 11/03/2022 20:12 CT HEAD WO CONTRAST CLINICAL DATA:  Mental status change  EXAM: CT HEAD WITHOUT CONTRAST  TECHNIQUE: Contiguous axial images were obtained from the base of the skull through the vertex without intravenous contrast.  RADIATION DOSE REDUCTION: This exam was performed according to the departmental dose-optimization program which includes automated exposure control, adjustment of the mA and/or kV according to patient size and/or use of iterative reconstruction technique.  COMPARISON:  Head CT 10/20/2022  FINDINGS: Brain: No evidence of acute infarction, hemorrhage, hydrocephalus, extra-axial collection or mass lesion/mass effect. There is mild diffuse atrophy and mild periventricular white matter hypodensity, unchanged from prior.  Vascular: No hyperdense vessel or unexpected calcification.  Skull: Normal. Negative for fracture or focal lesion.  Sinuses/Orbits: No acute finding.  Other: None.  IMPRESSION: 1. No acute intracranial abnormality. 2. Mild diffuse atrophy and mild periventricular white matter hypodensity, unchanged from prior.  Electronically Signed   By: Darliss Cheney M.D.   On: 11/03/2022 20:01    I personally reviewed recent imaging.   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  Patient had recently been hospitalized for similar presentation.  During this hospitalization, patient evaluated by SLP and noted to have chronic aspiration leading to aspiration pneumonia.  Patient's oral intake has been minimal to none requiring supplementation through D5 and prednisone.  Palliative medicine team consulted to assist with complex medical decision making.  # Complex medical decision making/goals of care  -Unable to participate in complex medical decision making due to her current medical status.  -Extensive discussion with family at bedside as documented in detail above in HPI.  From a medical standpoint, patient's body appears to be in multisystem organ failure and she is dying.  Patient is requiring multiple forms of life support and the fact that she cannot even metabolize glucose at this point due to her body shutting down.  Explained this in detail to family.  Also explained that placing a feeding tube would cause more risk than benefit (she will continue to aspirate) and would not change patient's overall prognosis.  Also acknowledged that during patient's last hospitalization, patient herself had stated she did not want a feeding tube and we should honor her wishes for medical care.  Expressed that pathway of care should be transitioned to focus on comfort at the end of patient's life to allow her quality time at home with family and the support of hospice. Family considering conversation and pathway they would like to proceed with moving forward.  -  Code Status: DNR  Prognosis: days- weeks  # Symptom management  -As per primary hospitalist  # Psycho-social/Spiritual Support:  - Support System: daughters, sister - Desire for further Chaplain support:yes  # Discharge Planning:  To Be Determined  Thank you for allowing the palliative care team to participate in  the care Elba Barman.  Alvester Morin, DO Palliative Care Provider PMT # [phone]  If patient remains symptomatic despite maximum doses, please call PMT at [phone] between 0700 and 1900. Outside of these hours, please call attending, as PMT does not have night coverage.  This  provider spent a total of 95 minutes providing patient's care.  Includes review of EMR, discussing care with other staff members involved in patient's medical care, obtaining relevant history and information from patient and/or patient's family, and personal review of imaging and lab work. Greater than 50% of the time was spent counseling and coordinating care related to the above assessment and plan.    *Please note that this is a verbal dictation therefore any spelling or grammatical errors are due to the "[employer]agon [employer] One" system interpretation.

## 2022-11-09 NOTE — Progress Notes (Signed)
Occupational Therapy Treatment Patient Details Name: Barbara Nelson MRN: [id number] DOB: 16-Apr-1941 Today's Date: 11/09/2022   History of present illness Patient is a 82 year old female who presented to ED on 6/18/204  from SNF due to dehydration and acute encephalpathy. pt was recemty d/c from [facility] on 6/14 following hospitalization  due to decreased urinary output, difficulty with mobility and swallowing. Patient was admitted with aspiration pneumonia, hyponatremia, PMH: HTN, recent UTI.   OT comments  Patient's family was motivated for patient to participate in time out of bed with patient remaining +2 with all bed mobility and total lift used to transition out of bed. Patient's rehab potential is guarded at this time with patient putting forth minimal efforts and less vocal than previous sessions. Voiced this to hospitalist and palliative as well. Unclear if patients family can support this current level of care at home at this time.  Patient's discharge plan remains appropriate at this time. OT will continue to follow acutely.     Recommendations for follow up therapy are one component of a multi-disciplinary discharge planning process, led by the attending physician.  Recommendations may be updated based on patient status, additional functional criteria and insurance authorization.    Assistance Recommended at Discharge Frequent or constant Supervision/Assistance  Patient can return home with the following  Two people to help with walking and/or transfers;Two people to help with bathing/dressing/bathroom;Assistance with cooking/housework;Direct supervision/assist for medications management;Assist for transportation;Help with stairs or ramp for entrance;Direct supervision/assist for financial management;Assistance with feeding   Equipment Recommendations  Other (comment) (total lift, hosptial bed)       Precautions / Restrictions Precautions Precautions: Fall Restrictions Weight  Bearing Restrictions: No       Mobility Bed Mobility Overal bed mobility: Needs Assistance Bed Mobility: Rolling Rolling: Max assist, +2 for physical assistance         General bed mobility comments: patient put forth less than 1% effort into movements on this date. patients family remains eager for patient to transition into recliner.       Balance Overall balance assessment: Needs assistance Sitting-balance support: Feet supported, Bilateral upper extremity supported Sitting balance-Leahy Scale: Poor Sitting balance - Comments: sitting in recliner with kyphotic posterior pelvic tilt posture with family educated on keeping BLE levevated.       ADL either performed or assessed with clinical judgement   ADL Overall ADL's : Needs assistance/impaired       General ADL Comments: patient unable to participate in ADl tasks with patient holding stress balls to engage in rolling in bed for total hygiene case with loose BM and urine in brief that family provides for patient. patient was +2 for hygiene tasks with increased A to maintain sidelying. patient with minimal verbalizations during this session. this therapist did discuss with pallative about patients participation in session and previous sessions on this date as well.      Cognition Arousal/Alertness: Lethargic Behavior During Therapy: Flat affect Overall Cognitive Status: Difficult to assess       Following Commands: Follows one step commands inconsistently     Problem Solving: Slow processing, Requires verbal cues, Requires tactile cues, Difficulty sequencing, Decreased initiation General Comments: Only brief moments of responsiveness.  99% eyes are closed.  Appears to have "selected" response.                   Pertinent Vitals/ Pain       Pain Assessment Pain Assessment: Faces Faces Pain Scale: Hurts whole  lot Pain Location: calls out but unab;e to state where pain is Pain Descriptors / Indicators:  Grimacing, Moaning Pain Intervention(s): Limited activity within patient's tolerance, Monitored during session, Repositioned, Patient requesting pain meds-RN notified         Frequency  Min 1X/week        Progress Toward Goals  OT Goals(current goals can now be found in the care plan section)  Progress towards OT goals: Not progressing toward goals - comment     Plan Discharge plan remains appropriate    Co-evaluation      Reason for Co-Treatment: To address functional/ADL transfers;For patient/therapist safety PT goals addressed during session: Mobility/safety with mobility OT goals addressed during session: ADL's and self-care      AM-PAC OT "6 Clicks" Daily Activity     Outcome Measure   Help from another person eating meals?: Total Help from another person taking care of personal grooming?: Total Help from another person toileting, which includes using toliet, bedpan, or urinal?: Total Help from another person bathing (including washing, rinsing, drying)?: Total Help from another person to put on and taking off regular upper body clothing?: Total Help from another person to put on and taking off regular lower body clothing?: Total 6 Click Score: 6    End of Session Equipment Utilized During Treatment: Other (comment) (total lift)  OT Visit Diagnosis: Pain;Muscle weakness (generalized) (M62.81);Other symptoms and signs involving the nervous system (R29.898)   Activity Tolerance Patient limited by lethargy   Patient Left in chair;with call bell/phone within reach;with family/visitor present   Nurse Communication Other (comment) (patients pain response with movements)        Time: 5284-1324 OT Time Calculation (min): 21 min  Charges: OT General Charges $OT Visit: 1 Visit OT Treatments $Therapeutic Activity: 8-22 mins  Rosalio Loud, MS Acute Rehabilitation Department Office# [phone]   Selinda Flavin 11/09/2022, 12:05 PM

## 2022-11-09 NOTE — Progress Notes (Signed)
Chaplain checked in with [name]'s daughter outside of the room. She talked about [name]'s recent healthcare journey of coming back to the hospital. Daughter voiced that they have a number of important decisions to make.   Chaplain provided compassionate and supportive presence and will continue to follow-up.  Hipolito Bayley, MDiv  11/09/22 1100  Spiritual Encounters  Type of Visit Follow up  Care provided to: Family  Reason for visit Routine spiritual support

## 2022-11-09 NOTE — TOC Progression Note (Addendum)
Transition of Care [facility]) - Progression Note    Patient Details  Name: Barbara Nelson MRN: [id number] Date of Birth: 09-19-1940  [facility] CM/SW Contact  Armanda Heritage, RN Phone Number: 11/09/2022, 9:18 AM  Clinical Narrative:    CM spoke with [facility] admissions rep who reports patient can return to facility when medically stable.  TOC will continue to follow to coordinate transfer when appropriate.  Updated FL2 completed and faxed to facility.    Expected Discharge Plan: Skilled Nursing Facility Barriers to Discharge: Continued Medical Work up  Expected Discharge Plan and Services In-house Referral: NA Discharge Planning Services: CM Consult Post Acute Care Choice: Resumption of Svcs/PTA Provider Living arrangements for the past 2 months: Skilled Nursing Facility (From [place])                 DME Arranged: N/A DME Agency: NA                   Social Determinants of Health (SDOH) Interventions SDOH Screenings   Food Insecurity: No Food Insecurity (11/03/2022)  Housing: Low Risk  (11/03/2022)  Transportation Needs: No Transportation Needs (11/03/2022)  Utilities: Not At Risk (11/03/2022)  Tobacco Use: Medium Risk (11/06/2022)    Readmission Risk Interventions    11/09/2022    9:17 AM 11/04/2022    2:18 PM  Readmission Risk Prevention Plan  Transportation Screening  Complete  PCP or Specialist Appt within 3-5 Days  Complete  HRI or Home Care Consult  Complete  Social Work Consult for Recovery Care Planning/Counseling  Complete  Palliative Care Screening  Not Applicable  Medication Review Oceanographer)  Referral to Pharmacy  HRI or Home Care Consult Complete   SW Recovery Care/Counseling Consult Complete   Palliative Care Screening Complete   Skilled Nursing Facility Complete

## 2022-11-09 NOTE — Progress Notes (Addendum)
Physical Therapy Treatment Patient Details Name: Barbara Nelson MRN: [id number] DOB: 1941/01/31 Today's Date: 11/09/2022   History of Present Illness Patient is a 82 year old female who presented to ED on 6/18/204  from SNF due to dehydration and acute encephalpathy. pt was recemty d/c from [facility] on 6/14 following hospitalization  due to decreased urinary output, difficulty with mobility and swallowing. Patient was admitted with aspiration pneumonia, hyponatremia, PMH: HTN, recent UTI.    PT Comments     Pt admitted with above diagnosis.  Pt currently with functional limitations due to the deficits listed below (see PT Problem List).  Pt in bed, family present, first attempt for intervention MD present and PT returned later in am. Pt limited EO and absent verbal communication pt exhibited pain behaviors with functional mobility and is unable to rate or communicate location. Pt required max A  x 2 to roll side to side and use of total lift for transfer from bed to recliner and positioned in recliner with use of pillows. Pt left in recliner all needs in place, family present and OD.   Pt will benefit from acute skilled PT to increase their independence and safety with mobility to allow discharge.     Recommendations for follow up therapy are one component of a multi-disciplinary discharge planning process, led by the attending physician.  Recommendations may be updated based on patient status, additional functional criteria and insurance authorization.  Follow Up Recommendations  Can patient physically be transported by private vehicle: No    Assistance Recommended at Discharge Frequent or constant Supervision/Assistance  Patient can return home with the following Two people to help with walking and/or transfers;Two people to help with bathing/dressing/bathroom;Direct supervision/assist for medications management;Help with stairs or ramp for entrance;Assistance with cooking/housework;Assist for  transportation;Assistance with feeding;Direct supervision/assist for financial management   Equipment Recommendations  None recommended by PT    Recommendations for Other Services       Precautions / Restrictions Precautions Precautions: Fall Precaution Comments: aspiration Restrictions Weight Bearing Restrictions: No     Mobility  Bed Mobility Overal bed mobility: Needs Assistance Bed Mobility: Rolling Rolling: Max assist, +2 for physical assistance         General bed mobility comments: patient put forth less than 1% effort into movements on this date. patients family remains eager for patient to transition into recliner.    Transfers Overall transfer level: Needs assistance Equipment used: None Transfers: Bed to chair/wheelchair/BSC             General transfer comment: pt left seated in recliner, transfered via total lift, positioned with pillows to address R lateral lean and limit anteriror/scaral sitting with reocmmendations that family have pt remain in reclier with B LEs elevated to maintain safe and proper positioning. 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 Postural control: Posterior lean, Right lateral lean  Cognition Arousal/Alertness: Lethargic Behavior During Therapy: Flat affect Overall Cognitive Status: Difficult to assess Area of Impairment: Attention, Following commands                  Orientation Level: Disoriented to, Place, Time, Situation     Following Commands: Follows one step commands inconsistently     Problem Solving: Slow processing, Requires verbal cues, Requires tactile cues, Difficulty sequencing, Decreased initiation General Comments: Only brief moments of responsiveness.  99% eyes are closed.  Appears to have "selected" response.        Exercises      General Comments        Pertinent Vitals/Pain Pain Assessment Pain Assessment: Faces Faces Pain Scale: Hurts whole lot Breathing: normal Negative Vocalization: occasional moan/groan, low speech, negative/disapproving quality Facial Expression: smiling or inexpressive Body Language: relaxed Consolability: no need to console PAINAD Score: 1 Pain Location: calls out but unab;e to state where pain is Pain Descriptors / Indicators: Grimacing, Moaning Pain Intervention(s): Limited activity within patient's tolerance, Monitored during session, Repositioned    Home Living Family/patient expects to be discharged to:: Skilled nursing facility Living Arrangements: Children Available Help at Discharge: Family;Personal care attendant Type of Home: House Home Access: Stairs to enter Entrance Stairs-Rails: Can reach both Entrance Stairs-Number of Steps: 1 Alternate Level Stairs-Number of Steps: pt stays on first level Home Layout: Multi-level;Able to live on main level with bedroom/bathroom Home Equipment: Toilet riser;Grab bars - toilet;Grab bars - tub/shower;Tub bench;Rolling Walker (2 wheels);Wheelchair - manual Additional Comments: doing HHPT and HHOT prior to admission    Prior Function            PT Goals (current goals can now be found in the care plan section) Acute Rehab PT Goals PT Goal Formulation: Patient unable to participate in goal setting Time For Goal Achievement: 11/18/22 Potential to Achieve Goals: Fair    Frequency    Min 1X/week      PT Plan       Co-evaluation   Reason for Co-Treatment: To address functional/ADL transfers;For patient/therapist safety PT goals addressed during session: Mobility/safety with mobility OT goals addressed during session: ADL's and self-care      AM-PAC PT "6 Clicks" Mobility   Outcome Measure  Help needed turning from your back to your side while in a flat bed without using bedrails?: Total Help needed moving from lying on your back to sitting on the side of a flat bed without using bedrails?: Total Help needed moving to and from a bed to a chair (including a wheelchair)?: Total Help needed standing up from a chair using your arms (e.g., wheelchair or bedside chair)?: Total Help needed to walk in hospital room?: Total Help needed climbing 3-5 steps with a railing? : Total 6 Click Score: 6    End of Session Equipment Utilized During Treatment: Oxygen Activity Tolerance: Patient limited by lethargy;Patient limited by pain Patient left: in chair;with family/visitor present Nurse Communication: Mobility status;Need for lift equipment PT Visit Diagnosis: Other abnormalities of gait and mobility (R26.89);Muscle weakness (generalized) (M62.81)     Time: 1610-9604 PT Time Calculation (min) (ACUTE ONLY): 22 min  Charges:                        Johnny Bridge, PT Acute Rehab    Jacqualyn Posey 11/09/2022, 1:07 PM

## 2022-11-10 ENCOUNTER — Other Ambulatory Visit (HOSPITAL_COMMUNITY): Payer: Self-pay

## 2022-11-10 DIAGNOSIS — G9340 Encephalopathy, unspecified: Secondary | ICD-10-CM | POA: Diagnosis not present

## 2022-11-10 LAB — BASIC METABOLIC PANEL
Anion gap: 9 (ref 5–15)
BUN: <5 mg/dL — ABNORMAL LOW (ref 8–23)
CO2: 21 mmol/L — ABNORMAL LOW (ref 22–32)
Calcium: 7.6 mg/dL — ABNORMAL LOW (ref 8.9–10.3)
Chloride: 107 mmol/L (ref 98–111)
Creatinine, Ser: 0.59 mg/dL (ref 0.44–1.00)
GFR, Estimated: >60 mL/min (ref >60)
Glucose, Bld: 78 mg/dL (ref 70–99)
Potassium: 3.9 mmol/L (ref 3.5–5.1)
Sodium: 137 mmol/L (ref 135–145)

## 2022-11-10 LAB — CBC
HCT: 32.9 % — ABNORMAL LOW (ref 36.0–46.0)
Hemoglobin: 10.8 g/dL — ABNORMAL LOW (ref 12.0–15.0)
MCH: 28.1 pg (ref 26.0–34.0)
MCHC: 32.8 g/dL (ref 30.0–36.0)
MCV: 85.7 fL (ref 80.0–100.0)
Platelets: 89 10*3/uL — ABNORMAL LOW (ref 150–400)
RBC: 3.84 MIL/uL — ABNORMAL LOW (ref 3.87–5.11)
RDW: 19.3 % — ABNORMAL HIGH (ref 11.5–15.5)
WBC: 5.4 10*3/uL (ref 4.0–10.5)
nRBC: 0.6 % — ABNORMAL HIGH (ref 0.0–0.2)

## 2022-11-10 LAB — GLUCOSE, CAPILLARY
Glucose-Capillary: 85 mg/dL (ref 70–99)
Glucose-Capillary: 92 mg/dL (ref 70–99)

## 2022-11-10 LAB — MAGNESIUM: Magnesium: 2 mg/dL (ref 1.7–2.4)

## 2022-11-10 MED ORDER — METOPROLOL TARTRATE 25 MG PO TABS
12.5000 mg | ORAL_TABLET | Freq: Two times a day (BID) | ORAL | 0 refills | Status: DC
  Filled 2022-11-10: qty 60, 60d supply, fill #0

## 2022-11-10 MED ORDER — FAMOTIDINE 40 MG/5ML PO SUSR: 20.0000 mg | Freq: Every day | ORAL | 0 refills | Status: DC

## 2022-11-10 MED ORDER — MULTIVIT/MULTIMINERAL ADULT PO LIQD: 15.0000 mL | Freq: Every day | ORAL | 0 refills | Status: AC

## 2022-11-10 MED ORDER — MAGIC MOUTHWASH
5.0000 mL | Freq: Three times a day (TID) | ORAL | Status: DC
  Administered 2022-11-10: 5 mL via ORAL
  Filled 2022-11-10 (×3): qty 5

## 2022-11-10 MED ORDER — DEXAMETHASONE SODIUM PHOSPHATE 10 MG/ML IJ SOLN
8.0000 mg | Freq: Once | INTRAMUSCULAR | Status: AC
  Administered 2022-11-11: 8 mg via INTRAMUSCULAR
  Filled 2022-11-10: qty 1

## 2022-11-10 MED ORDER — SUCRALFATE 1 GM/10ML PO SUSP: 1.0000 g | Freq: Three times a day (TID) | ORAL | 0 refills | Status: DC

## 2022-11-10 MED ORDER — ACETAMINOPHEN 160 MG/5ML PO LIQD
500.0000 mg | Freq: Three times a day (TID) | ORAL | 0 refills | Status: DC | PRN
  Filled 2022-11-10: qty 120, 3d supply, fill #0

## 2022-11-10 MED ORDER — MULTIVIT/MULTIMINERAL ADULT PO LIQD
15.0000 mL | Freq: Every day | ORAL | 0 refills | Status: DC
  Filled 2022-11-10: qty 300, 30d supply, fill #0

## 2022-11-10 MED ORDER — SUCRALFATE 1 GM/10ML PO SUSP: 1.0000 g | Freq: Three times a day (TID) | ORAL | 2 refills | Status: AC

## 2022-11-10 MED ORDER — FAMOTIDINE 40 MG/5ML PO SUSR
20.0000 mg | Freq: Every day | ORAL | 0 refills | Status: DC
  Filled 2022-11-10: qty 100, 40d supply, fill #0

## 2022-11-10 MED ORDER — METOPROLOL TARTRATE 25 MG PO TABS
25.0000 mg | ORAL_TABLET | Freq: Two times a day (BID) | ORAL | 0 refills | Status: DC
  Filled 2022-11-10: qty 60, 30d supply, fill #0

## 2022-11-10 MED ORDER — CEFDINIR 250 MG/5ML PO SUSR: 300.0000 mg | Freq: Two times a day (BID) | ORAL | 0 refills | Status: AC

## 2022-11-10 MED ORDER — DEXTROSE-SODIUM CHLORIDE 5-0.45 % IV SOLN: INTRAVENOUS | Status: AC

## 2022-11-10 MED ORDER — ONDANSETRON 4 MG PO TBDP: 4.0000 mg | ORAL_TABLET | Freq: Three times a day (TID) | ORAL | 0 refills | Status: DC | PRN

## 2022-11-10 MED ORDER — ACETAMINOPHEN 160 MG/5ML PO LIQD: 500.0000 mg | Freq: Three times a day (TID) | ORAL | 0 refills | Status: DC | PRN

## 2022-11-10 MED ORDER — ONDANSETRON 4 MG PO TBDP
4.0000 mg | ORAL_TABLET | Freq: Three times a day (TID) | ORAL | 0 refills | Status: DC | PRN
  Filled 2022-11-10: qty 30, 10d supply, fill #0

## 2022-11-10 MED ORDER — POLYETHYLENE GLYCOL 3350 17 GM/SCOOP PO POWD
17.0000 g | Freq: Two times a day (BID) | ORAL | 0 refills | Status: DC | PRN
  Filled 2022-11-10: qty 714, 21d supply, fill #0

## 2022-11-10 MED ORDER — PREDNISONE 5 MG/5ML PO SOLN: 20.0000 mg | Freq: Every day | ORAL | 0 refills | Status: DC

## 2022-11-10 MED ORDER — PREDNISONE 5 MG/5ML PO SOLN
20.0000 mg | Freq: Every day | ORAL | 0 refills | Status: DC
  Filled 2022-11-10: qty 500, 25d supply, fill #0

## 2022-11-10 MED ORDER — FOLIC ACID 1 MG PO TABS: 1.0000 mg | ORAL_TABLET | Freq: Every day | ORAL | 0 refills | Status: DC

## 2022-11-10 MED ORDER — POLYETHYLENE GLYCOL 3350 17 GM/SCOOP PO POWD: 17.0000 g | Freq: Two times a day (BID) | ORAL | 0 refills | Status: DC | PRN

## 2022-11-10 MED ORDER — METOPROLOL TARTRATE 25 MG PO TABS: 12.5000 mg | ORAL_TABLET | Freq: Two times a day (BID) | ORAL | 0 refills | Status: AC

## 2022-11-10 MED ORDER — FOLIC ACID 1 MG PO TABS
1.0000 mg | ORAL_TABLET | Freq: Every day | ORAL | 0 refills | Status: DC
  Filled 2022-11-10: qty 30, 30d supply, fill #0

## 2022-11-10 NOTE — Care Management Important Message (Signed)
Important Message  Patient Details IM Letter given. Name: Barbara Nelson MRN: [id number] Date of Birth: [birth year]   Medicare Important Message Given:  Yes     Caren Macadam 11/10/2022, 10:16 AM

## 2022-11-10 NOTE — TOC Progression Note (Addendum)
Transition of Care [facility]) - Progression Note    Patient Details  Name: Barbara Nelson MRN: [id number] Date of Birth: Sep 20, 1940  Transition of Care [facility]) CM/SW Contact  Armanda Heritage, RN Phone Number: 11/10/2022, 10:30 AM  Clinical Narrative:    CM met with patient's family at bedside, who report they would like patient to discharge to her home in [place] with home hospice support, address is [address] [place] [address].  Daughter selects [facility] services and reports will need a hospital bed and oxygen to be delivered to the home.  Reports patient will need EMS transport, this CM shared with family that transport over 50 miles requires up front payment of the full cost.  CM spoke with [facility] and received estimate of $3379.14, this information was shared with daughter Evangelia Whitaker who has agreed to this cost.    Referral made to [facility] and Palliative care services, spoke with Lynnell Catalan and faxed requested clinicals to [phone].  Genevie Cheshire reports he will speak with daughter directly and coordinate first visit and delivery of equipment.  Cm followed up with daughter and updated her on arrangements, Mrs Buen[place] reports she will update this CM once equipment delivery is set-up so that transportation can be coordinated.  Anticipate transportation will need to occur on 11/11/22 in AM.  TOC will continue to follow for discharge planning.   Addendum 1451: DME delivery is scheduled for today at 3pm.  Patient's daughter plans to be at home to receive.  [facility] transport arranged for 01/11/23 at 0830.  Daughter aware that Sharin Mons will be contacting her to finalize payment.  [facility] will need a facesheet at time of transport.  No other TOC needs identified at this time.  Expected Discharge Plan: Home w Hospice Care Barriers to Discharge: Continued Medical Work up  Expected Discharge Plan and Services In-house Referral:  NA Discharge Planning Services: CM Consult Post Acute Care Choice: Hospice Living arrangements for the past 2 months: Skilled Nursing Facility (From [facility])                 DME Arranged: N/A DME Agency: NA                   Social Determinants of Health (SDOH) Interventions SDOH Screenings   Food Insecurity: No Food Insecurity (11/03/2022)  Housing: Low Risk  (11/03/2022)  Transportation Needs: No Transportation Needs (11/03/2022)  Utilities: Not At Risk (11/03/2022)  Tobacco Use: Medium Risk (11/06/2022)    Readmission Risk Interventions    11/09/2022    9:17 AM 11/04/2022    2:18 PM  Readmission Risk Prevention Plan  Transportation Screening  Complete  PCP or Specialist Appt within 3-5 Days  Complete  HRI or Home Care Consult  Complete  Social Work Consult for Recovery Care Planning/Counseling  Complete  Palliative Care Screening  Not Applicable  Medication Review Oceanographer)  Referral to Pharmacy  HRI or Home Care Consult Complete   SW Recovery Care/Counseling Consult Complete   Palliative Care Screening Complete   Skilled Nursing Facility Complete

## 2022-11-10 NOTE — Progress Notes (Signed)
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  Apparently patient has been having severe dysphagia at the facility with poor oral nutrition.  Now there is concerns of dehydration therefore brought to the hospital.  Upon admission chest x-ray was overall unremarkable, CT of the head was negative.  There was clinical signs of dehydration with hypernatremia.  Patient getting D5 water.  Seen by speech and swallow therapy, high aspiration risk.  Eventually family was leaning towards taking patient home.  Family also requested I discuss her care with [facility] for potential transfer.  I spoke with their service and patient was declined for transfer and recommending continuing current care as they do not have anything different to offer.  Palliative care team following.  Plan is for patient to go home tomorrow home with hospice.   Assessment & Plan:  Principal Problem:   Acute encephalopathy Active Problems:   Chronic pulmonary blastomycosis (HCC)   Essential hypertension   Dehydration   FTT (failure to thrive) in adult   Hypernatremia   Hypokalemia   Chronic respiratory failure with hypoxia (HCC)   Dysphagia   Concern about end of life     Acute metabolic encephalopathy, toxic.  Drowsy -Mentation is stable at this time, metabolic workup negative.  CT head, ammonia, TSH, B12 are all normal.   Dysphagia  Odynophagia Recently completed 14-day course of Diflucan, now on PPI and Carafate.  Initially family had declined advanced feeding tube but now due to risk of aspiration, hypoglycemia and poor oral intake I have offered family to reconsider this.   Understandably family is still struggling with making decision regarding her long-term goals of care.  From medical standpoint patient clearly appears to be in a state where she has very poor oral intake with multiorgan shutting down.  Seen by palliative care team.  Recurrent hypoglycemia - Secondary to very poor oral intake.  Currently on amp of D50/glucagon as needed.  Cont D5 and Steroids.  Will time 1 dose of IM Decadron tomorrow morning prior to her transfer  Anasarca - Due to poor nutrition. For now fluids have been discontinued but may have to intermittently give her IV fluids if necessary.   Folic acid deficiency - IV folate > p.o.   Hypernatremia/hypokalemia Resolved.     Chronic hypoxic respiratory failure; recent pneumonia with parapneumonic effusion   Recently discharged on cefdinir for total of 28 days, last day 11/27/2022.  For now due to poor oral intake, IV Rocephin   Essential hypertension Transition Toprol-XL to short acting so it can be crushed and administered.   Urinary retention  - In and out cath as needed for now  Severe protein calorie malnutrition Adult failure to thrive Severe dysphagia with recurrent aspirations supportive care  Constipation - Bowel regimen  Given poor prognosis and very poor oral intake, patient is being transition to home with hospice.  Seen by palliative care service.  Will plan on transitioning patient medicines to oral/liquid or crushable medicine forms today and will send it to outpatient [facility] pharmacy so this can be filled prior to her transfer tomorrow morning.  DVT prophylaxis: Lovenox Code Status: DNR Family Communication: Daughters Updated.  Plan to transition her home  with home hospice tomorrow.   Diet Orders (From admission, onward)     Start     Ordered   11/05/22 0935  DIET - DYS 1 Room service appropriate? Yes with Assist; Fluid consistency: Nectar Thick  Diet effective now       Question Answer Comment  Room  service appropriate? Yes with Assist   Fluid consistency: Nectar Thick      11/05/22 0934            Subjective: Still weak, very poor oral intake.  Daughter at bedside, who met with TOC this morning. Plans for Home with hospice tomorrow.  Requesting her discharge medications to be sent to listed on outpatient pharmacy so it can be filled today and also received her IV antibiotic and IV steroids tomorrow morning prior to her transfer.  Also requesting to give her IV fluids throughout the day today to maximize her optimization.  Examination: Constitutional: Remains drowsy, some anasarca but slightly improved Respiratory: Clear to auscultation bilaterally Cardiovascular: Normal sinus rhythm, no rubs Abdomen: Nontender nondistended good bowel sounds Musculoskeletal: No edema noted Skin: No rashes seen Neurologic: Unable to thoroughly assess Psychiatric: Drowsy.  Objective: Vitals:   11/09/22 1313 11/09/22 2008 11/09/22 2100 11/10/22 0405  BP: 99/67 117/62  104/64  Pulse: 83 80  97  Resp:  16  20  Temp: (!) 97.3 F (36.3 C) (!) 94.4 F (34.7 C) (!) 97.4 F (36.3 C) (!) 97.3 F (36.3 C)  TempSrc:  Oral Axillary Axillary  SpO2: 99% 100%  99%  Weight:      Height:        Intake/Output Summary (Last 24 hours) at 11/10/2022 0902 Last data filed at 11/10/2022 0300 Gross per 24 hour  Intake 0 ml  Output 2 ml  Net -2 ml   Filed Weights   11/03/22 1902 11/05/22 0500  Weight: 86.2 kg 83.6 kg    Scheduled Meds:  docusate sodium  100 mg Oral BID   enoxaparin (LOVENOX) injection  40 mg Subcutaneous Q24H   folic acid  1 mg Intravenous Daily   Gerhardt's butt cream   Topical BID   ketotifen  1 drop Both Eyes BID   metoprolol succinate  12.5 mg Oral QPM   pantoprazole (PROTONIX) IV  40 mg Intravenous Q24H   polyethylene glycol  17 g Oral Daily   predniSONE  20 mg Oral Q breakfast   sucralfate  1 g Oral TID WC & HS   Continuous Infusions:  cefTRIAXone (ROCEPHIN)  IV 2 g  (11/09/22 1505)    Nutritional status     Body mass index is 29.75 kg/m.  Data Reviewed:   CBC: Recent Labs  Lab 11/03/22 2020 11/04/22 0813 11/06/22 0840 11/07/22 0831 11/08/22 1002 11/09/22 0718 11/10/22 0535  WBC 6.3   < > 4.9 4.3 4.9 4.5 5.4  NEUTROABS 4.0  --   --   --   --   --   --   HGB 11.0*   < > 12.0 12.2 11.8* 11.8* 10.8*  HCT 34.5*   < > 36.7 37.0 37.2 35.3* 32.9*  MCV 87.8   < > 87.2 84.9 88.4 84.9 85.7  PLT 174   < > 122* 113* 99* 85* 89*   < > = values in this interval not displayed.   Basic Metabolic Panel: Recent Labs  Lab 11/04/22 0813 11/04/22 0915 11/05/22 0981 11/06/22 0840 11/07/22 0831 11/08/22 1002 11/09/22 0718 11/10/22 0535  NA 149*  --    < >  137 136 136 136 137  K 3.0*  --    < > 3.3* 3.8 3.3* 3.7 3.9  CL 116*  --    < > 107 107 108 110 107  CO2 28  --    < > 25 22 22 22  21*  GLUCOSE 127*  --    < > 99 83 91 98 78  BUN 8  --    < > 7* 6* 6* 5* <5*  CREATININE 0.61  --    < > 0.64 0.60 0.70 0.55 0.59  CALCIUM 8.3*  --    < > 7.7* 7.5* 7.4* 7.4* 7.6*  MG 2.0  --    < > 1.8 1.7 1.7 2.0 2.0  PHOS 1.9* 1.9*  --  2.1* 3.8  --   --   --    < > = values in this interval not displayed.   GFR: Estimated Creatinine Clearance: 59.1 mL/min (by C-G formula based on SCr of 0.59 mg/dL). Liver Function Tests: Recent Labs  Lab 11/03/22 2020  AST 44*  ALT 25  ALKPHOS 123  BILITOT 0.7  PROT 6.3*  ALBUMIN 1.7*   No results for input(s): "LIPASE", "AMYLASE" in the last 168 hours. Recent Labs  Lab 11/03/22 2020  AMMONIA 14   Coagulation Profile: No results for input(s): "INR", "PROTIME" in the last 168 hours. Cardiac Enzymes: No results for input(s): "CKTOTAL", "CKMB", "CKMBINDEX", "TROPONINI" in the last 168 hours. BNP (last 3 results) No results for input(s): "PROBNP" in the last 8760 hours. HbA1C: No results for input(s): "HGBA1C" in the last 72 hours. CBG: Recent Labs  Lab 11/09/22 0010 11/09/22 0714 11/09/22 1201  11/09/22 2134 11/10/22 0559  GLUCAP 95 97 90 75 85   Lipid Profile: No results for input(s): "CHOL", "HDL", "LDLCALC", "TRIG", "CHOLHDL", "LDLDIRECT" in the last 72 hours. Thyroid Function Tests: No results for input(s): "TSH", "T4TOTAL", "FREET4", "T3FREE", "THYROIDAB" in the last 72 hours.  Anemia Panel: No results for input(s): "VITAMINB12", "FOLATE", "FERRITIN", "TIBC", "IRON", "RETICCTPCT" in the last 72 hours.  Sepsis Labs: Recent Labs  Lab 11/03/22 2020  LATICACIDVEN 1.0    No results found for this or any previous visit (from the past 240 hour(s)).       Radiology Studies: No results found.         LOS: 7 days   Time spent= 35 mins    Geovanie Winnett Joline Maxcy, MD [facility]  If 7PM-7AM, please contact night-coverage  11/10/2022, 9:02 AM

## 2022-11-10 NOTE — Progress Notes (Signed)
PMT no charge note.   Chart reviewed, briefly discussed with [facility] MD colleague, note plans for home with hospice under the care of her family. No new PMT specific recommendations at this time.  No charge Rosalin Hawking MD [place] [facility].

## 2022-11-11 DIAGNOSIS — G9340 Encephalopathy, unspecified: Secondary | ICD-10-CM | POA: Diagnosis not present

## 2022-11-11 DIAGNOSIS — Z711 Person with feared health complaint in whom no diagnosis is made: Secondary | ICD-10-CM | POA: Diagnosis not present

## 2022-11-11 DIAGNOSIS — E860 Dehydration: Secondary | ICD-10-CM | POA: Diagnosis not present

## 2022-11-11 DIAGNOSIS — R627 Adult failure to thrive: Secondary | ICD-10-CM

## 2022-11-11 DIAGNOSIS — J9611 Chronic respiratory failure with hypoxia: Secondary | ICD-10-CM | POA: Diagnosis not present

## 2022-11-11 DIAGNOSIS — B401 Chronic pulmonary blastomycosis: Secondary | ICD-10-CM

## 2022-11-11 LAB — GLUCOSE, CAPILLARY
Glucose-Capillary: 78 mg/dL (ref 70–99)
Glucose-Capillary: 100 mg/dL — ABNORMAL HIGH (ref 70–99)

## 2022-11-11 NOTE — Discharge Summary (Signed)
Physician Discharge Summary  Barbara Nelson:[id number] DOB: 03/09/1941 DOA: 11/03/2022  PCP: [name], MD  Admit date: 11/03/2022 Discharge date: 11/11/2022 Admitted From: Home Disposition: Home with home hospice   Discharge Condition: Stable but guarded prognosis CODE STATUS: DNR/DNI  Follow-up Information     [name], MD Follow up in 1 week(s).   Specialty: Family Medicine Contact information: [address] [place] [address] [phone]                 Hospital course 82 year old with history of chronic pulmonary blasto cytosis status post lobectomy 2014, chronic hypoxia, recurrent admissions for UTI, pneumonia, dysphagia with recurrent aspiration, HTN brought to the hospital from skilled nursing facility for altered mental status.  Apparently patient has been having severe dysphagia at the facility with poor oral nutrition.  Now there is concerns of dehydration therefore brought to the hospital.  Upon admission chest x-ray was overall unremarkable, CT of the head was negative.  There was clinical signs of dehydration with hypernatremia.  Patient does darted on D5 infusion.  Seen by speech and swallow therapy, high aspiration risk.  [facility] called by previous attending for transfer at family request but they declined transfer stating that they have nothing different to offer.   Palliative medicine involved.  CODE STATUS changed to DNR/DNI.  Eventually, family decided to take patient with home hospice.   See individual problem list below for more.   Problems addressed during this hospitalization Principal Problem:   Acute encephalopathy Active Problems:   Chronic pulmonary blastomycosis (HCC)   Essential hypertension   Dehydration   FTT (failure to thrive) in adult   Hypernatremia   Hypokalemia   Chronic respiratory failure with hypoxia (HCC)   Dysphagia   Concern about end of life   Acute metabolic encephalopathy, toxic.   Remains encephalopathic.  Workup including CT head, ammonia, TSH and B12 unrevealing.   Dysphagia/odynophagia: Family declined feeding tube. -Discharged home with home hospice -Continue PPI and Carafate   Recurrent hypoglycemia: Due to poor p.o. intake -Discharged home with home hospice     Chronic hypoxic respiratory failure; recent pneumonia with parapneumonic effusion   -Received IV ceftriaxone in-house.  Continue p.o. cefdinir until 7/12 as previously planned   Essential hypertension -Continue Toprol-XL    Severe protein calorie malnutrition/adult failure to thrive/anasarca/folic acid deficiency   Constipation -Bowel regimen as needed    Hypernatremia/hypokalemia: Resolved           Time spent 35 minutes  Vital signs Vitals:   11/10/22 1300 11/10/22 1317 11/10/22 2118 11/11/22 0608  BP: 96/64 96/64 109/85 116/74  Pulse: 97 97 89 87  Temp: 97.7 F (36.5 C)  97.8 F (36.6 C) 98 F (36.7 C)  Resp:   18 18  Height:      Weight:      SpO2: 100% 100% 96% 99%  TempSrc: Axillary   Oral  BMI (Calculated):         Discharge exam  GENERAL: No apparent distress.  Nontoxic. HEENT: MMM.  NECK: Supple.  No apparent JVD.  RESP:  No IWOB.  Fair aeration bilaterally. CVS:  RRR. Heart sounds normal.  ABD/GI/GU: BS+. Abd soft, NTND.  MSK/EXT: No apparent deformity SKIN: no apparent skin lesion or wound NEURO: Somnolent and sleepy.  No apparent focal neuro deficit but limited exam due to encephalopathy. PSYCH: No distress or agitation  Discharge Instructions  Allergies as of 11/11/2022       Reactions  Latex Rash   Penicillins Rash, Itching   Tape Hives, Rash   Z-pak [azithromycin] Rash, Other (See Comments)   Mouth sores   Dairycare [lactase-lactobacillus] Other (See Comments)   intolerance   Egg-derived Products Nausea And Vomiting   Potassium Nausea And Vomiting   Amoxil [amoxicillin] Rash   Sulfa Antibiotics Rash        Medication List     STOP  taking these medications    acetaminophen 325 MG tablet Commonly known as: TYLENOL Replaced by: acetaminophen 160 MG/5ML liquid   metoprolol succinate 25 MG 24 hr tablet Commonly known as: TOPROL-XL   multivitamin with minerals Tabs tablet   potassium chloride 20 MEQ packet Commonly known as: KLOR-CON       TAKE these medications    (feeding supplement) PROSource Plus liquid Take 30 mLs by mouth 3 (three) times daily between meals.   feeding supplement (KATE FARMS STANDARD 1.4) Liqd liquid Take 325 mLs by mouth 2 (two) times daily between meals.   acetaminophen 160 MG/5ML liquid Commonly known as: TYLENOL Take 15.6 mLs (500 mg total) by mouth every 8 (eight) hours as needed for fever or pain. Replaces: acetaminophen 325 MG tablet   azelastine 0.05 % ophthalmic solution Commonly known as: OPTIVAR Apply 1 drop to eye 2 (two) times daily.   cefdinir 250 MG/5ML suspension Commonly known as: OMNICEF Take 6 mLs (300 mg total) by mouth 2 (two) times daily for 17 days.   famotidine 40 MG/5ML suspension Commonly known as: PEPCID Take 2.5 mLs (20 mg total) by mouth daily.   folic acid 1 MG tablet Commonly known as: FOLVITE Take 1 tablet (1 mg total) by mouth daily.   food thickener Gel Commonly known as: SIMPLYTHICK (NECTAR/LEVEL 2/MILDLY THICK) Take 5 packets by mouth as needed.   guaiFENesin-dextromethorphan 100-10 MG/5ML syrup Commonly known as: ROBITUSSIN DM Take 5 mLs by mouth every 4 (four) hours as needed for cough.   hydrocortisone 25 MG suppository Commonly known as: ANUSOL-HC Place 1 suppository (25 mg total) rectally 2 (two) times daily.   magic mouthwash Soln Take 5 mLs by mouth 4 (four) times daily.   metoprolol tartrate 25 MG tablet Commonly known as: LOPRESSOR Take 0.5 tablets (12.5 mg total) by mouth 2 (two) times daily.   Multivit/Multimineral Adult Liqd Take 15 mLs by mouth daily.   ondansetron 4 MG disintegrating tablet Commonly known as:  ZOFRAN-ODT Take 1 tablet (4 mg total) by mouth every 8 (eight) hours as needed for nausea or vomiting.   pantoprazole sodium 40 mg Commonly known as: PROTONIX Place 40 mg into feeding tube daily.   Pataday 0.1 % ophthalmic solution Generic drug: olopatadine Place 1 drop into both eyes daily as needed for allergies.   polyethylene glycol powder 17 GM/SCOOP powder Commonly known as: MiraLax Take 17 g by mouth 2 (two) times daily as needed for severe constipation. Decrease to once daily if having watery stools. What changed: reasons to take this   predniSONE 5 MG/5ML solution Take 20 mLs (20 mg total) by mouth daily with breakfast.   sucralfate 1 GM/10ML suspension Commonly known as: CARAFATE Take 10 mLs (1 g total) by mouth 4 (four) times daily -  with meals and at bedtime.   triamcinolone 0.025 % ointment Commonly known as: KENALOG Apply 1 Application topically 2 (two) times daily. Upper back, arms, legs   witch hazel-glycerin pad Commonly known as: TUCKS Apply topically as needed for itching.        Consultations:  Palliative medicine  Procedures/Studies:   DG Chest Port 1 View  Result Date: 11/03/2022 CLINICAL DATA:  AMS EXAM: PORTABLE CHEST - 1 VIEW COMPARISON:  10/20/2022 FINDINGS: For significant improvement in right upper lung airspace disease. Slight decrease in left retrocardiac consolidation/atelectasis. Small residual pleural effusions. Stable mild cardiomegaly. No pneumothorax. Visualized bones unremarkable. Residual contrast in the splenic flexure of the colon. IMPRESSION: Improving right upper lung and left retrocardiac airspace disease. Electronically Signed   By: Corlis Leak M.D.   On: 11/03/2022 20:12   CT HEAD WO CONTRAST  Result Date: 11/03/2022 CLINICAL DATA:  Mental status change EXAM: CT HEAD WITHOUT CONTRAST TECHNIQUE: Contiguous axial images were obtained from the base of the skull through the vertex without intravenous contrast. RADIATION DOSE  REDUCTION: This exam was performed according to the departmental dose-optimization program which includes automated exposure control, adjustment of the mA and/or kV according to patient size and/or use of iterative reconstruction technique. COMPARISON:  Head CT 10/20/2022 FINDINGS: Brain: No evidence of acute infarction, hemorrhage, hydrocephalus, extra-axial collection or mass lesion/mass effect. There is mild diffuse atrophy and mild periventricular white matter hypodensity, unchanged from prior. Vascular: No hyperdense vessel or unexpected calcification. Skull: Normal. Negative for fracture or focal lesion. Sinuses/Orbits: No acute finding. Other: None. IMPRESSION: 1. No acute intracranial abnormality. 2. Mild diffuse atrophy and mild periventricular white matter hypodensity, unchanged from prior. Electronically Signed   By: Darliss Cheney M.D.   On: 11/03/2022 20:01   DG Swallowing Func-Speech Pathology  Result Date: 10/26/2022 Table formatting from the original result was not included. Modified Barium Swallow Study Patient Details Name: Barbara Nelson MRN: [id number] Date of Birth: November 28, 1940 Today's Date: 10/26/2022 HPI/PMH: HPI: GLORIANNE PROCTOR is a 82 y.o. 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  Most of the history is provided by the patient's daughter, as the patient is slightly confused and quite sleepy, though she is interactive.  Daughter who is at the bedside states that patient at baseline is quite sharp, and independent.  However being back in February she was diagnosed with a urinary tract infection, and has been quite weak since that time and was just getting back to feeling like her normal self.  Came back to the ER on the 16th of this month with concerns about bronchitis, was diagnosed with  UTI and eventually prescribed antibiotics.  She never seem to get her strength back after this.  Over the last few days, patient has been slightly confused, has been very weak not eating or drinking very much.  Family also noticed that she seems to choke and cough whenever she tries to eat or drink something.  This has happened to her intermittently in the past, but is more consistent now.  Seen by SLP in February 2024 - dys1/2 and thin liquids. MBS completed 10/14/22 recommending Dys 1, nectar thick liquids. Clinical Impression: Clinical Impression: Patient presents with moderate oral and pharyngeal dysphagia with sensorimotor deficits.  Oral transiting significantly impaired with lingual pumping, poor organization and boluses spilling into pharynx off time poorly controlled.  Attempts at mastication of solids were ineffective with vertical pattern and anterior sulcus.  Patient required pudding as well as liquids to finally transit poorly masticated solid -and this required significant effort.  Secretion retention noted in pharynx that mixed with barium retention resulting in  inconsistent silent trace aspiration.  Patient had no sensation to pharyngeal retention and did not try swallow despite cues.  Thin liquid aspiration noted resulting in subtle throat clear but patient did not call adequately when she did cough on command to clear.  Various postures and counting 1-3 to help elicit swallow were not concern affect.  Currently patient is at elevated risk of malnutrition and aspiration.  Optimal diet to mitigate aspiration would be nectar liquid and very creamy pures right now that does not require mastication.  Patient was awake during eval but required frequent stimulation to maintain eye opening.  RN reports she was much more alert today than the last few days.  SLP will follow-up for family education and to ascertain indication for advancement of diet.  At this point we will maximize liquid nutrition IV ensures  or Glucerna for energy conservation.  Highly recommend set up oral suction to allow suctioning the patient before and after meals due to secretion retention. SLP recommending continue Dys 1 (puree), nectar thick liquids during meals but allow all types of thin/regular liquids in between meals. Factors that may increase risk of adverse event in presence of aspiration [name] & [name] 2021): Factors that may increase risk of adverse event in presence of aspiration [name] & [name] 2021): Frail or deconditioned; Dependence for feeding and/or oral hygiene; Weak cough Recommendations/Plan: Swallowing Evaluation Recommendations Swallowing Evaluation Recommendations Recommendations: PO diet PO Diet Recommendation: Dysphagia 1 (Pureed); Mildly thick liquids (Level 2, nectar thick) Liquid Administration via: Cup; Straw; Spoon Medication Administration: Crushed with puree Supervision: Full supervision/cueing for swallowing strategies Swallowing strategies  : Slow rate; Small bites/sips; Follow solids with liquids; Avoid mixed consistencies Postural changes: Stay upright 30-60 min after meals; Position pt fully upright for meals Oral care recommendations: Oral care before PO; Staff/trained caregiver to provide oral care Recommended consults: Consider Palliative care Caregiver Recommendations: Have oral suction available Treatment Plan Treatment Plan Treatment recommendations: Therapy as outlined in treatment plan below Follow-up recommendations: Follow physicians's recommendations for discharge plan and follow up therapies Functional status assessment: Patient has had a recent decline in their functional status and demonstrates the ability to make significant improvements in function in a reasonable and predictable amount of time. Treatment frequency: Min 2x/week Treatment duration: 1 week Interventions: Aspiration precaution training; Compensatory techniques; Patient/family education; Diet toleration management by SLP;  Trials of upgraded texture/liquids Recommendations Recommendations for follow up therapy are one component of a multi-disciplinary discharge planning process, led by the attending physician.  Recommendations may be updated based on patient status, additional functional criteria and insurance authorization. Assessment: Orofacial Exam: Orofacial Exam Oral Cavity: Oral Hygiene: WFL Oral Cavity - Dentition: Adequate natural dentition Orofacial Anatomy: WFL Oral Motor/Sensory Function: Generalized oral weakness Anatomy: Anatomy: Suspected cervical osteophytes Boluses Administered: Boluses Administered Boluses Administered: Thin liquids (Level 0); Mildly thick liquids (Level 2, nectar thick)  Oral Impairment Domain: Oral Impairment Domain Lip Closure: No labial escape Tongue control during bolus hold: Posterior escape of greater than half of bolus Bolus preparation/mastication: Minimal chewing/mashing with majority of bolus unchewed Bolus transport/lingual motion: Repetitive/disorganized tongue motion Oral residue: Residue collection on oral structures Location of oral residue : Tongue Initiation of pharyngeal swallow : Pyriform sinuses  Pharyngeal Impairment Domain: Pharyngeal Impairment Domain Soft palate elevation: No bolus between soft palate (SP)/pharyngeal wall (PW) Laryngeal elevation: Partial superior movement of thyroid cartilage/partial approximation of arytenoids to epiglottic petiole Anterior hyoid excursion: Partial anterior movement Epiglottic movement: Complete inversion Laryngeal vestibule closure: Incomplete, narrow column air/contrast  in laryngeal vestibule Pharyngeal stripping wave : Present - diminished Pharyngeal contraction (A/P view only): N/A Pharyngoesophageal segment opening: -- (difficult view due to shoulder obstruction) Tongue base retraction: No contrast between tongue base and posterior pharyngeal wall (PPW) Pharyngeal residue: Collection of residue within or on pharyngeal structures  Location of pharyngeal residue: Valleculae; Pyriform sinuses; Aryepiglottic folds; Tongue base; Diffuse (>3 areas)  Esophageal Impairment Domain: Esophageal Impairment Domain Esophageal clearance upright position: Complete clearance, esophageal coating Penetration/Aspiration Scale Score: 3-Material enters airway, remains ABOVE vocal cords and not ejected out; thin, nectar thick Compensatory Strategies: Compensatory Strategies Compensatory strategies: Yes Straw: Ineffective Ineffective Straw: Thin liquid (Level 0) Effortful swallow: -- (inability to perform) Multiple swallows: Effective (partially) Liquid wash: Effective Effective Liquid Wash: Mildly thick liquid (Level 2, nectar thick); Puree; Solid Other(comment): -- (counting to help initiate swallow, neck extenstion to push barium into pharynx)   General Information: Caregiver present: No  Diet Prior to this Study: Dysphagia 1 (pureed); Mildly thick liquids (Level 2, nectar thick)   Temperature : Normal   Respiratory Status: WFL   Supplemental O2: Nasal cannula   History of Recent Intubation: No  Behavior/Cognition: Alert; Cooperative; Pleasant mood; Lethargic/Drowsy Self-Feeding Abilities: Dependent for feeding Baseline vocal quality/speech: Not observed Volitional Cough: Unable to elicit Volitional Swallow: Unable to elicit Exam Limitations: Fatigue Goal Planning: Prognosis for improved oropharyngeal function: Fair Barriers to Reach Goals: Motivation; Time post onset Barriers/Prognosis Comment: Recurrent hospital admissions and severe deconditioning Patient/Family Stated Goal: family would like patient to be able to advance to some solids beyond Dys 1 Consulted and agree with results and recommendations: Pt unable/family or caregiver not available Pain: Pain Assessment Pain Assessment: No/denies pain Pain Score: 0 Faces Pain Scale: 0 Breathing: 0 Negative Vocalization: 0 Facial Expression: 0 Body Language: 0 Consolability: 0 PAINAD Score: 0 Pain Location:  bilateral knees Pain Descriptors / Indicators: Grimacing; Guarding Pain Intervention(s): Monitored during session End of Session: Start Time:SLP Start Time (ACUTE ONLY): 1305 Stop Time: SLP Stop Time (ACUTE ONLY): 1325 Time Calculation:SLP Time Calculation (min) (ACUTE ONLY): 20 min Charges: SLP Evaluations $ SLP Speech Visit: 1 Visit SLP Evaluations $MBS Swallow: 1 Procedure SLP visit diagnosis: SLP Visit Diagnosis: Dysphagia, oropharyngeal phase (R13.12) Past Medical History: Past Medical History: Diagnosis Date  Benign essential HTN 10/31/2015  Cardiac tamponade due to viral pericarditis   LBBB (left bundle branch block)   Lung mass   fungal infection s/p resection at [facility]  Postoperative atrial fibrillation [facility])   Pulmonary nodule  Past Surgical History: Past Surgical History: Procedure Laterality Date  ABDOMINAL HYSTERECTOMY  1988  chest tube placement  09/03/11  LUNG REMOVAL, PARTIAL    right knee arthoscopy with meniscal repair    subxiphoid pericardial window and drainage anterior partial pericardiectomy  09/03/11 Angela Nevin, MA, CCC-SLP Speech Therapy   CT CHEST WO CONTRAST  Result Date: 10/24/2022 CLINICAL DATA:  History of blastomycosis. Status post right middle lobectomy. Chronic dyspnea. EXAM: CT CHEST WITHOUT CONTRAST TECHNIQUE: Multidetector CT imaging of the chest was performed following the standard protocol without IV contrast. RADIATION DOSE REDUCTION: This exam was performed according to the departmental dose-optimization program which includes automated exposure control, adjustment of the mA and/or kV according to patient size and/or use of iterative reconstruction technique. COMPARISON:  06/18/2022 FINDINGS: Cardiovascular: Heart is enlarged. No substantial pericardial effusion. Mild atherosclerotic calcification is noted in the wall of the thoracic aorta. Enlargement of the pulmonary outflow tract/main pulmonary arteries suggests pulmonary arterial hypertension. Mediastinum/Nodes: Similar  mediastinal lymphadenopathy.  Index prevascular node measuring 13 mm short axis on 53/2 was 14 mm when remeasured in a similar fashion on the prior study. Borderline subcarinal lymphadenopathy evident. No evidence for gross hilar lymphadenopathy although assessment is limited by the lack of intravenous contrast on the current study. The esophagus has normal imaging features. There is no axillary lymphadenopathy. Lungs/Pleura: Interlobular septal thickening noted in the upper lobes bilaterally. Surgical changes are noted in the right lung. Interval development of posterior left lower lobe collapse/consolidation. Perifissural collapse/consolidation noted along the right major fissure inferiorly. There is some dependent atelectasis in the right lung base. Small left pleural effusion is new in the interval. Loculated right pleural effusion is new since prior. Upper Abdomen: Visualized portion of the upper abdomen is unremarkable. Musculoskeletal: No worrisome lytic or sclerotic osseous abnormality. IMPRESSION: 1. Interval development of posterior left lower lobe collapse/consolidation for pneumonia. There is patchy airspace disease in the pendant right lung base. 2. New small free-flowing left pleural effusion with associated new small to moderate right effusion with apparent loculation. Concerning. 3. Similar mediastinal lymphadenopathy. 4. Enlargement of the pulmonary outflow tract/main pulmonary arteries suggests pulmonary arterial hypertension. 5.  Aortic Atherosclerosis (ICD10-I70.0). Electronically Signed   By: Kennith Center M.D.   On: 10/24/2022 14:29   DG CHEST PORT 1 VIEW  Result Date: 10/20/2022 CLINICAL DATA:  Acute respiratory failure with hypoxia EXAM: PORTABLE CHEST 1 VIEW COMPARISON:  10/11/2022 FINDINGS: Blunting of both costophrenic angles with substantially increased peripheral density in the right mid chest likely from fluid in the minor fissure and possibly tracking along the pleural margin and  major fissure. Mild retrocardiac airspace opacity increased from prior. Thoracic spondylosis. Mild enlargement of the cardiopericardial silhouette. Indistinct pulmonary vasculature with potential cephalization of blood flow favoring pulmonary venous hypertension. IMPRESSION: 1. Mild enlargement of the cardiopericardial silhouette with indistinct pulmonary vasculature and potential cephalization of blood flow favoring pulmonary venous hypertension. 2. Bilateral pleural effusions, increased on the right 3. Increased peripheral density in the right mid chest likely from fluid in the minor fissure and possibly tracking along the major fissure. 4. Increased retrocardiac airspace opacity potentially from atelectasis or pneumonia. Electronically Signed   By: Gaylyn Rong M.D.   On: 10/20/2022 14:04   CT HEAD WO CONTRAST ( )  Result Date: 10/20/2022 CLINICAL DATA:  Neuro deficit, acute, stroke suspected. EXAM: CT HEAD WITHOUT CONTRAST TECHNIQUE: Contiguous axial images were obtained from the base of the skull through the vertex without intravenous contrast. RADIATION DOSE REDUCTION: This exam was performed according to the departmental dose-optimization program which includes automated exposure control, adjustment of the mA and/or kV according to patient size and/or use of iterative reconstruction technique. COMPARISON:  Head CT 10/11/2022. FINDINGS: Brain: No acute intracranial hemorrhage. Unchanged mild chronic small-vessel disease. Gray-white differentiation is otherwise preserved. No hydrocephalus or extra-axial collection. No mass effect or midline shift. Vascular: No hyperdense vessel or unexpected calcification. Skull: No calvarial fracture or suspicious bone lesion. Skull base is unremarkable. Sinuses/Orbits: Unremarkable. Other: None. IMPRESSION: 1. No acute intracranial abnormality. 2. Unchanged mild chronic small-vessel disease. Electronically Signed   By: Orvan Falconer M.D.   On: 10/20/2022 11:34    DG Swallowing Func-Speech Pathology  Result Date: 10/14/2022 Table formatting from the original result was not included. Modified Barium Swallow Study Patient Details Name: LAKENA SPARLIN MRN: [id number] Date of Birth: 1941/05/15 Today's Date: 10/14/2022 HPI/PMH: HPI: Barbara Nelson is a 82 y.o. 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  Most of the history is provided by the patient's daughter, as the patient is slightly confused and quite sleepy, though she is interactive.  Daughter who is at the bedside states that patient at baseline is quite sharp, and independent.  However being back in February she was diagnosed with a urinary tract infection, and has been quite weak since that time and was just getting back to feeling like her normal self.  Came back to the ER on the 16th of this month with concerns about bronchitis, was diagnosed with UTI and eventually prescribed antibiotics.  She never seem to get her strength back after this.  Over the last few days, patient has been slightly confused, has been very weak not eating or drinking very much.  Family also noticed that she seems to choke and cough whenever she tries to eat or drink something.  This has happened to her intermittently in the past, but is more consistent now.  Seen by SLP in February 2024 - dys1/2 and thin liquids. No MBS. Clinical Impression: Clinical Impression: Patient presents with moderate oral and pharyngeal dysphagia with sensorimotor deficits.  Oral transiting significantly impaired with lingual pumping, poor organization and boluses spilling into pharynx off time poorly controlled.  Attempts at mastication of solids were ineffective with vertical pattern and anterior sulcus.  Patient required pudding as well as liquids to finally  transit poorly masticated solid -and this required significant effort.  Secretion retention noted in pharynx that mixed with barium retention resulting in inconsistent silent trace aspiration.  Patient had no sensation to pharyngeal retention and did not try swallow despite cues.  Thin liquid aspiration noted resulting in subtle throat clear but patient did not call adequately when she did cough on command to clear.  Various postures and counting 1-3 to help elicit swallow were not concern affect.  Currently patient is at elevated risk of malnutrition and aspiration.  Optimal diet to mitigate aspiration would be nectar liquid and very creamy pures right now that does not require mastication.  Patient was awake during eval but required frequent stimulation to maintain eye opening.  RN reports she was much more alert today than the last few days.  SLP will follow-up for family education and to ascertain indication for advancement of diet.  At this point we will maximize liquid nutrition IV ensures or Glucerna for energy conservation.  Highly recommend set up oral suction to allow suctioning the patient before and after meals due to secretion retention. Factors that may increase risk of adverse event in presence of aspiration [name] [employer] [name] 2021): Factors that may increase risk of adverse event in presence of aspiration [name] & [name] 2021): Weak cough; Limited mobility; Reduced cognitive function; Frail or deconditioned Recommendations/Plan: Swallowing Evaluation Recommendations Swallowing Evaluation Recommendations Recommendations: PO diet PO Diet Recommendation: Full liquid diet; Dysphagia 1 (Pureed); Mildly thick liquids (Level 2, nectar thick) Liquid Administration via: Cup; Spoon; Straw Medication Administration: Crushed with puree Supervision: Full supervision/cueing for swallowing strategies Swallowing strategies  : Slow rate; Small bites/sips; Follow solids with liquids; Avoid mixed consistencies (Sure  patient swallows if not oral suction; Cease p.o. intake if patient clearing her throat or coughing.) Postural changes: Stay upright 30-60 min after meals; Position pt fully upright for meals Oral care recommendations: Oral care before PO Recommended consults: Consider Palliative care Caregiver Recommendations: Have oral suction available  Treatment Plan Treatment Plan Treatment recommendations: Therapy as outlined in treatment plan below Follow-up recommendations: Follow physicians's recommendations for discharge plan and follow up therapies Functional status assessment: Patient has had a recent decline in their functional status and demonstrates the ability to make significant improvements in function in a reasonable and predictable amount of time. Treatment frequency: Min 1x/week Treatment duration: 1 week Interventions: Aspiration precaution training; Compensatory techniques; Patient/family education Recommendations Recommendations for follow up therapy are one component of a multi-disciplinary discharge planning process, led by the attending physician.  Recommendations may be updated based on patient status, additional functional criteria and insurance authorization. Assessment: Orofacial Exam: Orofacial Exam Oral Cavity: Oral Hygiene: Pooled secretions Oral Cavity - Dentition: Adequate natural dentition Orofacial Anatomy: WFL Oral Motor/Sensory Function: Generalized oral weakness Anatomy: Anatomy: Suspected cervical osteophytes Boluses Administered: Boluses Administered Boluses Administered: Thin liquids (Level 0); Mildly thick liquids (Level 2, nectar thick); Moderately thick liquids (Level 3, honey thick); Solid; Puree  Oral Impairment Domain: Oral Impairment Domain Lip Closure: No labial escape Tongue control during bolus hold: Posterior escape of greater than half of bolus Bolus preparation/mastication: Minimal chewing/mashing with majority of bolus unchewed Bolus transport/lingual motion:  Repetitive/disorganized tongue motion Oral residue: Residue collection on oral structures Location of oral residue : Tongue Initiation of pharyngeal swallow : Pyriform sinuses  Pharyngeal Impairment Domain: Pharyngeal Impairment Domain Soft palate elevation: No bolus between soft palate (SP)/pharyngeal wall (PW) Laryngeal elevation: Partial superior movement of thyroid cartilage/partial approximation of arytenoids to epiglottic petiole Anterior hyoid excursion: Partial anterior movement Epiglottic movement: Complete inversion Laryngeal vestibule closure: Incomplete, narrow column air/contrast in laryngeal vestibule Pharyngeal stripping wave : Present - diminished Pharyngeal contraction (A/P view only): N/A Pharyngoesophageal segment opening: -- (difficult view due to shoulder obstruction) Tongue base retraction: Narrow column of contrast or air between tongue base and PPW Pharyngeal residue: Collection of residue within or on pharyngeal structures Location of pharyngeal residue: Tongue base; Valleculae; Pyriform sinuses; Aryepiglottic folds; Diffuse (>3 areas)  Esophageal Impairment Domain: Esophageal Impairment Domain Esophageal clearance upright position: Esophageal retention Pill: Esophageal Impairment Domain Esophageal clearance upright position: Esophageal retention Penetration/Aspiration Scale Score: Penetration/Aspiration Scale Score 1.  Material does not enter airway: Moderately thick liquids (Level 3, honey thick); Puree; Solid 3.  Material enters airway, remains ABOVE vocal cords and not ejected out: Mildly thick liquids (Level 2, nectar thick) 8.  Material enters airway, passes BELOW cords without attempt by patient to eject out (silent aspiration) : Thin liquids (Level 0) Compensatory Strategies: Compensatory Strategies Compensatory strategies: Yes Straw: -- (inconsistently effective) Effortful swallow: -- (inability to perform) Multiple swallows: Effective (partially) Liquid wash: Effective Effective  Liquid Wash: Mildly thick liquid (Level 2, nectar thick); Puree; Solid Other(comment): -- (counting to help initiate swallow, neck extenstion to push barium into pharynx)   General Information: Caregiver present: No  Diet Prior to this Study: Thin liquids (Level 0); Dysphagia 3 (mechanical soft)   Temperature : Normal   Respiratory Status: WFL   Supplemental O2: Nasal cannula   History of Recent Intubation: No  Behavior/Cognition: Alert; Cooperative; Pleasant mood Self-Feeding Abilities: Able to self-feed Baseline vocal quality/speech: Hypophonia/low volume Volitional Cough: Able to elicit Volitional Swallow: Unable to elicit Exam Limitations: Fatigue; Limited visibility Goal Planning: Prognosis for improved oropharyngeal function: Fair Barriers to Reach Goals: Motivation; Time post onset Barriers/Prognosis Comment: Recurrent hospital admissions and severe deconditioning Patient/Family Stated Goal: none stated Consulted and agree with results and recommendations: Pt unable/family or caregiver not available Pain: Pain Assessment Pain Assessment: Faces  Pain Score: 0 Faces Pain Scale: 4 Breathing: 0 Negative Vocalization: 0 Facial Expression: 0 Body Language: 0 Consolability: 0 PAINAD Score: 0 Pain Location: R knee Pain Descriptors / Indicators: Grimacing; Guarding Pain Intervention(s): Limited activity within patient's tolerance; Monitored during session; Repositioned End of Session: Start Time:SLP Start Time (ACUTE ONLY): 0830 Stop Time: SLP Stop Time (ACUTE ONLY): 0905 Time Calculation:SLP Time Calculation (min) (ACUTE ONLY): 35 min Charges: SLP Evaluations $ SLP Speech Visit: 1 Visit SLP Evaluations $BSS Swallow: 1 Procedure $MBS Swallow: 1 Procedure $Swallowing Treatment: 1 Procedure SLP visit diagnosis: SLP Visit Diagnosis: Dysphagia, oropharyngeal phase (R13.12) Past Medical History: Past Medical History: Diagnosis Date  Benign essential HTN 10/31/2015  Cardiac tamponade due to viral pericarditis   LBBB (left  bundle branch block)   Lung mass   fungal infection s/p resection at [facility]  Postoperative atrial fibrillation [facility])   Pulmonary nodule  Past Surgical History: Past Surgical History: Procedure Laterality Date  ABDOMINAL HYSTERECTOMY  1988  chest tube placement  09/03/11  LUNG REMOVAL, PARTIAL    right knee arthoscopy with meniscal repair    subxiphoid pericardial window and drainage anterior partial pericardiectomy  09/03/11 [name], MS [facility] SLP Acute Rehab Services Office [phone] [name] 10/14/2022, 9:35 AM      The results of significant diagnostics from this hospitalization (including imaging, microbiology, ancillary and laboratory) are listed below for reference.     Microbiology: No results found for this or any previous visit (from the past 240 hour(s)).   Labs:  CBC: Recent Labs  Lab 11/06/22 0840 11/07/22 0831 11/08/22 1002 11/09/22 0718 11/10/22 0535  WBC 4.9 4.3 4.9 4.5 5.4  HGB 12.0 12.2 11.8* 11.8* 10.8*  HCT 36.7 37.0 37.2 35.3* 32.9*  MCV 87.2 84.9 88.4 84.9 85.7  PLT 122* 113* 99* 85* 89*   BMP &GFR Recent Labs  Lab 11/06/22 0840 11/07/22 0831 11/08/22 1002 11/09/22 0718 11/10/22 0535  NA 137 136 136 136 137  K 3.3* 3.8 3.3* 3.7 3.9  CL 107 107 108 110 107  CO2 25 22 22 22  21*  GLUCOSE 99 83 91 98 78  BUN 7* 6* 6* 5* <5*  CREATININE 0.64 0.60 0.70 0.55 0.59  CALCIUM 7.7* 7.5* 7.4* 7.4* 7.6*  MG 1.8 1.7 1.7 2.0 2.0  PHOS 2.1* 3.8  --   --   --    Estimated Creatinine Clearance: 59.1 mL/min (by C-G formula based on SCr of 0.59 mg/dL). Liver & Pancreas: No results for input(s): "AST", "ALT", "ALKPHOS", "BILITOT", "PROT", "ALBUMIN" in the last 168 hours. No results for input(s): "LIPASE", "AMYLASE" in the last 168 hours. No results for input(s): "AMMONIA" in the last 168 hours. Diabetic: No results for input(s): "HGBA1C" in the last 72 hours. Recent Labs  Lab 11/09/22 2134 11/10/22 0559 11/10/22 1120 11/10/22 2308 11/11/22 0611  GLUCAP  75 85 78 92 100*   Cardiac Enzymes: No results for input(s): "CKTOTAL", "CKMB", "CKMBINDEX", "TROPONINI" in the last 168 hours. No results for input(s): "PROBNP" in the last 8760 hours. Coagulation Profile: No results for input(s): "INR", "PROTIME" in the last 168 hours. Thyroid Function Tests: No results for input(s): "TSH", "T4TOTAL", "FREET4", "T3FREE", "THYROIDAB" in the last 72 hours. Lipid Profile: No results for input(s): "CHOL", "HDL", "LDLCALC", "TRIG", "CHOLHDL", "LDLDIRECT" in the last 72 hours. Anemia Panel: No results for input(s): "VITAMINB12", "FOLATE", "FERRITIN", "TIBC", "IRON", "RETICCTPCT" in the last 72 hours. Urine analysis:    Component Value Date/Time   COLORURINE YELLOW 11/04/2022 0150  APPEARANCEUR CLEAR 11/04/2022 0150   APPEARANCEUR Hazy (A) 08/10/2022 1300   LABSPEC 1.010 11/04/2022 0150   PHURINE 7.0 11/04/2022 0150   GLUCOSEU NEGATIVE 11/04/2022 0150   HGBUR NEGATIVE 11/04/2022 0150   BILIRUBINUR NEGATIVE 11/04/2022 0150   BILIRUBINUR Negative 08/10/2022 1300   KETONESUR 5 (A) 11/04/2022 0150   PROTEINUR 30 (A) 11/04/2022 0150   UROBILINOGEN 0.2 09/15/2011 0530   NITRITE NEGATIVE 11/04/2022 0150   LEUKOCYTESUR NEGATIVE 11/04/2022 0150   Sepsis Labs: Invalid input(s): "PROCALCITONIN", "LACTICIDVEN"   SIGNED:  Almon Hercules, MD  [facility] 11/11/2022, 6:12 PM

## 2022-11-27 ENCOUNTER — Encounter: Payer: Medicare Other | Admitting: Dietician

## 2022-12-09 ENCOUNTER — Other Ambulatory Visit: Payer: Self-pay | Admitting: Cardiology

## 2022-12-17 DEATH — deceased

## 2022-12-18 ENCOUNTER — Encounter: Payer: Medicare Other | Attending: Cardiology | Admitting: Dietician

## 2023-01-07 ENCOUNTER — Ambulatory Visit: Payer: Medicare Other | Admitting: Cardiology
